# Patient Record
Sex: Male | Born: 1970
Health system: Southern US, Community
[De-identification: ages and names within clinical notes are randomized; demographics above are authoritative.]

## PROBLEM LIST (undated history)

## (undated) DIAGNOSIS — I319 Disease of pericardium, unspecified: Secondary | ICD-10-CM

## (undated) DIAGNOSIS — R011 Cardiac murmur, unspecified: Secondary | ICD-10-CM

## (undated) DIAGNOSIS — J45909 Unspecified asthma, uncomplicated: Secondary | ICD-10-CM

## (undated) DIAGNOSIS — E119 Type 2 diabetes mellitus without complications: Secondary | ICD-10-CM

## (undated) DIAGNOSIS — G459 Transient cerebral ischemic attack, unspecified: Secondary | ICD-10-CM

## (undated) DIAGNOSIS — R609 Edema, unspecified: Secondary | ICD-10-CM

## (undated) DIAGNOSIS — R569 Unspecified convulsions: Secondary | ICD-10-CM

## (undated) DIAGNOSIS — E785 Hyperlipidemia, unspecified: Secondary | ICD-10-CM

## (undated) DIAGNOSIS — G622 Polyneuropathy due to other toxic agents: Secondary | ICD-10-CM

## (undated) DIAGNOSIS — M1990 Unspecified osteoarthritis, unspecified site: Secondary | ICD-10-CM

## (undated) DIAGNOSIS — G619 Inflammatory polyneuropathy, unspecified: Secondary | ICD-10-CM

## (undated) DIAGNOSIS — I2510 Atherosclerotic heart disease of native coronary artery without angina pectoris: Secondary | ICD-10-CM

## (undated) DIAGNOSIS — I10 Essential (primary) hypertension: Secondary | ICD-10-CM

## (undated) DIAGNOSIS — K219 Gastro-esophageal reflux disease without esophagitis: Secondary | ICD-10-CM

## (undated) DIAGNOSIS — N529 Male erectile dysfunction, unspecified: Secondary | ICD-10-CM

## (undated) DIAGNOSIS — M109 Gout, unspecified: Secondary | ICD-10-CM

## (undated) DIAGNOSIS — E291 Testicular hypofunction: Secondary | ICD-10-CM

## (undated) DIAGNOSIS — G47419 Narcolepsy without cataplexy: Secondary | ICD-10-CM

## (undated) DIAGNOSIS — I209 Angina pectoris, unspecified: Secondary | ICD-10-CM

## (undated) DIAGNOSIS — D869 Sarcoidosis, unspecified: Secondary | ICD-10-CM

## (undated) HISTORY — DX: Type 2 diabetes mellitus without complications: E11.9

## (undated) HISTORY — DX: Male erectile dysfunction, unspecified: N52.9

## (undated) HISTORY — DX: Narcolepsy without cataplexy: G47.419

## (undated) HISTORY — DX: Inflammatory polyneuropathy, unspecified: G61.9

## (undated) HISTORY — DX: Atherosclerotic heart disease of native coronary artery without angina pectoris: I25.10

## (undated) HISTORY — DX: Transient cerebral ischemic attack, unspecified: G45.9

## (undated) HISTORY — DX: Gastro-esophageal reflux disease without esophagitis: K21.9

## (undated) HISTORY — DX: Essential (primary) hypertension: I10

## (undated) HISTORY — DX: Hyperlipidemia, unspecified: E78.5

## (undated) HISTORY — DX: Disease of pericardium, unspecified: I31.9

## (undated) HISTORY — DX: Testicular hypofunction: E29.1

## (undated) HISTORY — DX: Unspecified osteoarthritis, unspecified site: M19.90

## (undated) HISTORY — DX: Edema, unspecified: R60.9

## (undated) HISTORY — DX: Inflammatory polyneuropathy, unspecified: G62.2

## (undated) HISTORY — DX: Gout, unspecified: M10.9

## (undated) HISTORY — DX: Sarcoidosis, unspecified: D86.9

---

## 1997-10-02 ENCOUNTER — Inpatient Hospital Stay (HOSPITAL_COMMUNITY): Admission: EM | Admit: 1997-10-02 | Discharge: 1997-10-03 | Payer: Self-pay | Admitting: Emergency Medicine

## 1997-10-04 ENCOUNTER — Emergency Department (HOSPITAL_COMMUNITY): Admission: EM | Admit: 1997-10-04 | Discharge: 1997-10-04 | Payer: Self-pay | Admitting: Emergency Medicine

## 1997-10-26 ENCOUNTER — Encounter: Admission: RE | Admit: 1997-10-26 | Discharge: 1997-10-26 | Payer: Self-pay | Admitting: Family Medicine

## 2000-04-19 ENCOUNTER — Encounter: Payer: Self-pay | Admitting: Emergency Medicine

## 2000-04-19 ENCOUNTER — Emergency Department (HOSPITAL_COMMUNITY): Admission: EM | Admit: 2000-04-19 | Discharge: 2000-04-20 | Payer: Self-pay | Admitting: Emergency Medicine

## 2000-07-16 ENCOUNTER — Encounter: Payer: Self-pay | Admitting: Internal Medicine

## 2000-07-16 ENCOUNTER — Inpatient Hospital Stay (HOSPITAL_COMMUNITY): Admission: EM | Admit: 2000-07-16 | Discharge: 2000-07-17 | Payer: Self-pay | Admitting: Emergency Medicine

## 2001-05-16 ENCOUNTER — Encounter: Payer: Self-pay | Admitting: Internal Medicine

## 2001-05-16 ENCOUNTER — Ambulatory Visit (HOSPITAL_COMMUNITY): Admission: RE | Admit: 2001-05-16 | Discharge: 2001-05-16 | Payer: Self-pay | Admitting: Internal Medicine

## 2001-05-17 ENCOUNTER — Encounter: Payer: Self-pay | Admitting: Internal Medicine

## 2001-05-17 ENCOUNTER — Inpatient Hospital Stay (HOSPITAL_COMMUNITY): Admission: EM | Admit: 2001-05-17 | Discharge: 2001-05-20 | Payer: Self-pay | Admitting: *Deleted

## 2001-05-18 ENCOUNTER — Encounter: Payer: Self-pay | Admitting: Internal Medicine

## 2002-02-20 ENCOUNTER — Inpatient Hospital Stay (HOSPITAL_COMMUNITY): Admission: EM | Admit: 2002-02-20 | Discharge: 2002-02-22 | Payer: Self-pay | Admitting: Emergency Medicine

## 2002-02-20 ENCOUNTER — Encounter: Payer: Self-pay | Admitting: *Deleted

## 2002-02-21 ENCOUNTER — Encounter: Payer: Self-pay | Admitting: Cardiovascular Disease

## 2002-02-21 ENCOUNTER — Encounter (INDEPENDENT_AMBULATORY_CARE_PROVIDER_SITE_OTHER): Payer: Self-pay | Admitting: Cardiovascular Disease

## 2002-02-22 ENCOUNTER — Encounter: Payer: Self-pay | Admitting: Cardiology

## 2003-02-16 ENCOUNTER — Emergency Department (HOSPITAL_COMMUNITY): Admission: AD | Admit: 2003-02-16 | Discharge: 2003-02-16 | Payer: Self-pay | Admitting: Emergency Medicine

## 2003-11-22 ENCOUNTER — Ambulatory Visit (HOSPITAL_COMMUNITY): Admission: RE | Admit: 2003-11-22 | Discharge: 2003-11-22 | Payer: Self-pay | Admitting: Cardiovascular Disease

## 2003-11-22 ENCOUNTER — Inpatient Hospital Stay (HOSPITAL_COMMUNITY): Admission: EM | Admit: 2003-11-22 | Discharge: 2003-11-23 | Payer: Self-pay | Admitting: Emergency Medicine

## 2006-01-06 ENCOUNTER — Encounter: Admission: RE | Admit: 2006-01-06 | Discharge: 2006-01-06 | Payer: Self-pay | Admitting: Cardiovascular Disease

## 2007-08-03 ENCOUNTER — Ambulatory Visit (HOSPITAL_COMMUNITY): Admission: RE | Admit: 2007-08-03 | Discharge: 2007-08-03 | Payer: Self-pay | Admitting: Cardiovascular Disease

## 2007-12-07 ENCOUNTER — Emergency Department (HOSPITAL_COMMUNITY): Admission: EM | Admit: 2007-12-07 | Discharge: 2007-12-08 | Payer: Self-pay | Admitting: Emergency Medicine

## 2008-07-23 ENCOUNTER — Encounter: Admission: RE | Admit: 2008-07-23 | Discharge: 2008-07-23 | Payer: Self-pay | Admitting: Cardiovascular Disease

## 2008-07-30 ENCOUNTER — Emergency Department (HOSPITAL_COMMUNITY): Admission: EM | Admit: 2008-07-30 | Discharge: 2008-07-30 | Payer: Self-pay | Admitting: Internal Medicine

## 2008-08-10 ENCOUNTER — Ambulatory Visit: Payer: Self-pay | Admitting: Pulmonary Disease

## 2008-08-14 ENCOUNTER — Telehealth: Payer: Self-pay | Admitting: Pulmonary Disease

## 2008-08-14 LAB — CONVERTED CEMR LAB
ALT: 35 units/L (ref 0–53)
AST: 26 units/L (ref 0–37)
Albumin: 3.8 g/dL (ref 3.5–5.2)
Alkaline Phosphatase: 72 units/L (ref 39–117)
Angiotensin 1 Converting Enzyme: 38 units/L (ref 9–67)
Anti Nuclear Antibody(ANA): NEGATIVE
BUN: 11 mg/dL (ref 6–23)
Basophils Absolute: 0 10*3/uL (ref 0.0–0.1)
Basophils Relative: 0.8 % (ref 0.0–3.0)
Bilirubin, Direct: 0.1 mg/dL (ref 0.0–0.3)
CEA: 1.5 ng/mL (ref 0.0–5.0)
CO2: 30 meq/L (ref 19–32)
Calcium: 9.4 mg/dL (ref 8.4–10.5)
Chloride: 102 meq/L (ref 96–112)
Creatinine, Ser: 0.7 mg/dL (ref 0.4–1.5)
Eosinophils Absolute: 0.3 10*3/uL (ref 0.0–0.7)
Eosinophils Relative: 5 % (ref 0.0–5.0)
GFR calc Af Amer: 165 mL/min
GFR calc non Af Amer: 136 mL/min
Glucose, Bld: 109 mg/dL — ABNORMAL HIGH (ref 70–99)
HCT: 40.6 % (ref 39.0–52.0)
Hemoglobin: 13.8 g/dL (ref 13.0–17.0)
LDH: 241 units/L (ref 94–250)
Lymphocytes Relative: 40.5 % (ref 12.0–46.0)
MCHC: 34 g/dL (ref 30.0–36.0)
MCV: 78.7 fL (ref 78.0–100.0)
Monocytes Absolute: 0.7 10*3/uL (ref 0.1–1.0)
Monocytes Relative: 11.1 % (ref 3.0–12.0)
Neutro Abs: 2.7 10*3/uL (ref 1.4–7.7)
Neutrophils Relative %: 42.6 % — ABNORMAL LOW (ref 43.0–77.0)
Platelets: 354 10*3/uL (ref 150–400)
Potassium: 3.8 meq/L (ref 3.5–5.1)
RBC: 5.16 M/uL (ref 4.22–5.81)
RDW: 13.1 % (ref 11.5–14.6)
Rhuematoid fact SerPl-aCnc: <20 intl units/mL — ABNORMAL LOW (ref 0.0–20.0)
Sed Rate: 42 mm/hr — ABNORMAL HIGH (ref 0–16)
Sodium: 140 meq/L (ref 135–145)
Total Bilirubin: 0.7 mg/dL (ref 0.3–1.2)
Total Protein: 7.6 g/dL (ref 6.0–8.3)
WBC: 6.2 10*3/uL (ref 4.5–10.5)
hCG, Beta Chain, Quant, S: <0.5 milliintl units/mL

## 2008-08-16 ENCOUNTER — Ambulatory Visit: Payer: Self-pay | Admitting: Cardiovascular Disease

## 2008-08-17 ENCOUNTER — Ambulatory Visit: Payer: Self-pay | Admitting: Pulmonary Disease

## 2008-08-21 ENCOUNTER — Ambulatory Visit: Payer: Self-pay | Admitting: Emergency Medicine

## 2008-08-21 ENCOUNTER — Encounter: Payer: Self-pay | Admitting: Emergency Medicine

## 2008-08-21 ENCOUNTER — Ambulatory Visit (HOSPITAL_COMMUNITY): Admission: RE | Admit: 2008-08-21 | Discharge: 2008-08-21 | Payer: Self-pay | Admitting: Thoracic Surgery

## 2008-08-21 ENCOUNTER — Encounter: Payer: Self-pay | Admitting: Thoracic Surgery

## 2008-08-21 HISTORY — PX: BRONCHOSCOPY: SUR163

## 2008-08-27 ENCOUNTER — Telehealth: Payer: Self-pay | Admitting: Pulmonary Disease

## 2008-08-31 ENCOUNTER — Ambulatory Visit: Payer: Self-pay | Admitting: Pulmonary Disease

## 2008-09-04 ENCOUNTER — Telehealth (INDEPENDENT_AMBULATORY_CARE_PROVIDER_SITE_OTHER): Payer: Self-pay | Admitting: *Deleted

## 2008-10-02 ENCOUNTER — Ambulatory Visit: Payer: Self-pay | Admitting: Pulmonary Disease

## 2008-10-03 LAB — CONVERTED CEMR LAB
ALT: 92 units/L — ABNORMAL HIGH (ref 0–53)
AST: 49 units/L — ABNORMAL HIGH (ref 0–37)
Albumin: 3.8 g/dL (ref 3.5–5.2)
Alkaline Phosphatase: 73 units/L (ref 39–117)
BUN: 12 mg/dL (ref 6–23)
Basophils Absolute: 0.3 10*3/uL — ABNORMAL HIGH (ref 0.0–0.1)
Basophils Relative: 3 % (ref 0.0–3.0)
Bilirubin, Direct: 0.2 mg/dL (ref 0.0–0.3)
CO2: 28 meq/L (ref 19–32)
Calcium: 9.4 mg/dL (ref 8.4–10.5)
Chloride: 99 meq/L (ref 96–112)
Creatinine, Ser: 0.8 mg/dL (ref 0.4–1.5)
Eosinophils Absolute: 0 10*3/uL (ref 0.0–0.7)
Eosinophils Relative: 0.1 % (ref 0.0–5.0)
GFR calc non Af Amer: 140.82 mL/min (ref >60)
Glucose, Bld: 176 mg/dL — ABNORMAL HIGH (ref 70–99)
HCT: 43.8 % (ref 39.0–52.0)
Hemoglobin: 15.1 g/dL (ref 13.0–17.0)
Lymphocytes Relative: 8.2 % — ABNORMAL LOW (ref 12.0–46.0)
Lymphs Abs: 0.9 10*3/uL (ref 0.7–4.0)
MCHC: 34.5 g/dL (ref 30.0–36.0)
MCV: 78.9 fL (ref 78.0–100.0)
Monocytes Absolute: 0.1 10*3/uL (ref 0.1–1.0)
Monocytes Relative: 0.6 % — ABNORMAL LOW (ref 3.0–12.0)
Neutro Abs: 9.1 10*3/uL — ABNORMAL HIGH (ref 1.4–7.7)
Neutrophils Relative %: 88.1 % — ABNORMAL HIGH (ref 43.0–77.0)
Platelets: 295 10*3/uL (ref 150.0–400.0)
Potassium: 4.2 meq/L (ref 3.5–5.1)
RBC: 5.54 M/uL (ref 4.22–5.81)
RDW: 14.7 % — ABNORMAL HIGH (ref 11.5–14.6)
Sodium: 137 meq/L (ref 135–145)
Total Bilirubin: 0.7 mg/dL (ref 0.3–1.2)
Total Protein: 7.6 g/dL (ref 6.0–8.3)
WBC: 10.4 10*3/uL (ref 4.5–10.5)

## 2008-11-06 ENCOUNTER — Ambulatory Visit: Payer: Self-pay | Admitting: Pulmonary Disease

## 2008-11-07 ENCOUNTER — Ambulatory Visit: Payer: Self-pay | Admitting: Cardiology

## 2008-11-12 ENCOUNTER — Telehealth: Payer: Self-pay | Admitting: Pulmonary Disease

## 2008-11-12 LAB — CONVERTED CEMR LAB
ALT: 52 units/L (ref 0–53)
AST: 29 units/L (ref 0–37)
Albumin: 3.5 g/dL (ref 3.5–5.2)
Alkaline Phosphatase: 77 units/L (ref 39–117)
BUN: 14 mg/dL (ref 6–23)
Basophils Absolute: 0 10*3/uL (ref 0.0–0.1)
Basophils Relative: 0 % (ref 0.0–3.0)
Bilirubin, Direct: 0.1 mg/dL (ref 0.0–0.3)
CO2: 30 meq/L (ref 19–32)
Calcium: 8.9 mg/dL (ref 8.4–10.5)
Chloride: 102 meq/L (ref 96–112)
Creatinine, Ser: 0.8 mg/dL (ref 0.4–1.5)
Eosinophils Absolute: 0 10*3/uL (ref 0.0–0.7)
Eosinophils Relative: 0.2 % (ref 0.0–5.0)
GFR calc non Af Amer: 140.74 mL/min (ref >60)
Glucose, Bld: 213 mg/dL — ABNORMAL HIGH (ref 70–99)
HCT: 39.6 % (ref 39.0–52.0)
Hemoglobin: 13.8 g/dL (ref 13.0–17.0)
Lymphocytes Relative: 15.5 % (ref 12.0–46.0)
Lymphs Abs: 1.3 10*3/uL (ref 0.7–4.0)
MCHC: 34.9 g/dL (ref 30.0–36.0)
MCV: 78.9 fL (ref 78.0–100.0)
Monocytes Absolute: 0.2 10*3/uL (ref 0.1–1.0)
Monocytes Relative: 2.6 % — ABNORMAL LOW (ref 3.0–12.0)
Neutro Abs: 7.2 10*3/uL (ref 1.4–7.7)
Neutrophils Relative %: 81.7 % — ABNORMAL HIGH (ref 43.0–77.0)
Platelets: 302 10*3/uL (ref 150.0–400.0)
Potassium: 3.8 meq/L (ref 3.5–5.1)
RBC: 5.02 M/uL (ref 4.22–5.81)
RDW: 15.1 % — ABNORMAL HIGH (ref 11.5–14.6)
Sodium: 139 meq/L (ref 135–145)
TSH: 0.22 microintl units/mL — ABNORMAL LOW (ref 0.35–5.50)
Total Bilirubin: 0.7 mg/dL (ref 0.3–1.2)
Total Protein: 7.5 g/dL (ref 6.0–8.3)
WBC: 8.7 10*3/uL (ref 4.5–10.5)

## 2008-11-13 LAB — CONVERTED CEMR LAB
Free T4: 0.7 ng/dL (ref 0.6–1.6)
T3 Uptake Ratio: 45.2 % — ABNORMAL HIGH (ref 22.5–37.0)

## 2008-11-19 ENCOUNTER — Telehealth: Payer: Self-pay | Admitting: Pulmonary Disease

## 2008-11-20 ENCOUNTER — Ambulatory Visit: Payer: Self-pay | Admitting: Thoracic Surgery

## 2008-11-30 ENCOUNTER — Ambulatory Visit (HOSPITAL_COMMUNITY): Admission: RE | Admit: 2008-11-30 | Discharge: 2008-11-30 | Payer: Self-pay | Admitting: Thoracic Surgery

## 2008-11-30 ENCOUNTER — Ambulatory Visit: Payer: Self-pay | Admitting: Thoracic Surgery

## 2008-11-30 ENCOUNTER — Encounter: Payer: Self-pay | Admitting: Thoracic Surgery

## 2008-11-30 HISTORY — PX: MEDIASTINOSCOPY: SUR861

## 2008-12-04 ENCOUNTER — Ambulatory Visit: Payer: Self-pay | Admitting: Thoracic Surgery

## 2008-12-04 ENCOUNTER — Encounter: Payer: Self-pay | Admitting: Pulmonary Disease

## 2008-12-13 ENCOUNTER — Ambulatory Visit: Payer: Self-pay | Admitting: Pulmonary Disease

## 2008-12-13 DIAGNOSIS — D869 Sarcoidosis, unspecified: Secondary | ICD-10-CM | POA: Insufficient documentation

## 2008-12-20 ENCOUNTER — Ambulatory Visit (HOSPITAL_BASED_OUTPATIENT_CLINIC_OR_DEPARTMENT_OTHER): Admission: RE | Admit: 2008-12-20 | Discharge: 2008-12-20 | Payer: Self-pay | Admitting: Pulmonary Disease

## 2008-12-20 ENCOUNTER — Encounter: Payer: Self-pay | Admitting: Pulmonary Disease

## 2008-12-26 ENCOUNTER — Ambulatory Visit: Payer: Self-pay | Admitting: Pulmonary Disease

## 2009-01-01 ENCOUNTER — Ambulatory Visit: Payer: Self-pay | Admitting: Thoracic Surgery

## 2009-01-01 ENCOUNTER — Encounter: Admission: RE | Admit: 2009-01-01 | Discharge: 2009-01-01 | Payer: Self-pay | Admitting: Thoracic Surgery

## 2009-01-08 ENCOUNTER — Ambulatory Visit: Payer: Self-pay | Admitting: Pulmonary Disease

## 2009-01-09 ENCOUNTER — Encounter: Payer: Self-pay | Admitting: Pulmonary Disease

## 2009-01-09 ENCOUNTER — Ambulatory Visit (HOSPITAL_BASED_OUTPATIENT_CLINIC_OR_DEPARTMENT_OTHER): Admission: RE | Admit: 2009-01-09 | Discharge: 2009-01-09 | Payer: Self-pay | Admitting: Pulmonary Disease

## 2009-01-25 ENCOUNTER — Ambulatory Visit: Payer: Self-pay | Admitting: Pulmonary Disease

## 2009-01-29 ENCOUNTER — Ambulatory Visit (HOSPITAL_COMMUNITY): Admission: RE | Admit: 2009-01-29 | Discharge: 2009-01-29 | Payer: Self-pay | Admitting: Pulmonary Disease

## 2009-01-31 ENCOUNTER — Ambulatory Visit: Payer: Self-pay | Admitting: Pulmonary Disease

## 2009-02-01 ENCOUNTER — Telehealth: Payer: Self-pay | Admitting: Pulmonary Disease

## 2009-02-06 ENCOUNTER — Telehealth: Payer: Self-pay | Admitting: Pulmonary Disease

## 2009-03-01 ENCOUNTER — Ambulatory Visit: Payer: Self-pay | Admitting: Pulmonary Disease

## 2009-03-06 LAB — CONVERTED CEMR LAB
ALT: 38 units/L (ref 0–53)
AST: 23 units/L (ref 0–37)
Albumin: 3.8 g/dL (ref 3.5–5.2)
Alkaline Phosphatase: 70 units/L (ref 39–117)
BUN: 14 mg/dL (ref 6–23)
Basophils Absolute: 0.3 10*3/uL — ABNORMAL HIGH (ref 0.0–0.1)
Basophils Relative: 3.4 % — ABNORMAL HIGH (ref 0.0–3.0)
Bilirubin, Direct: 0 mg/dL (ref 0.0–0.3)
CO2: 26 meq/L (ref 19–32)
Calcium: 9.5 mg/dL (ref 8.4–10.5)
Chloride: 98 meq/L (ref 96–112)
Creatinine, Ser: 0.9 mg/dL (ref 0.4–1.5)
Eosinophils Absolute: 0 10*3/uL (ref 0.0–0.7)
Eosinophils Relative: 0 % (ref 0.0–5.0)
GFR calc non Af Amer: 122.64 mL/min (ref >60)
Glucose, Bld: 237 mg/dL — ABNORMAL HIGH (ref 70–99)
HCT: 41.2 % (ref 39.0–52.0)
Hemoglobin: 14.1 g/dL (ref 13.0–17.0)
Lymphocytes Relative: 11.8 % — ABNORMAL LOW (ref 12.0–46.0)
Lymphs Abs: 1.1 10*3/uL (ref 0.7–4.0)
MCHC: 34.3 g/dL (ref 30.0–36.0)
MCV: 79.9 fL (ref 78.0–100.0)
Monocytes Absolute: 1.4 10*3/uL — ABNORMAL HIGH (ref 0.1–1.0)
Monocytes Relative: 15.7 % — ABNORMAL HIGH (ref 3.0–12.0)
Neutro Abs: 6.4 10*3/uL (ref 1.4–7.7)
Neutrophils Relative %: 69.1 % (ref 43.0–77.0)
Platelets: 320 10*3/uL (ref 150.0–400.0)
Potassium: 3.9 meq/L (ref 3.5–5.1)
RBC: 5.16 M/uL (ref 4.22–5.81)
RDW: 13.6 % (ref 11.5–14.6)
Sodium: 134 meq/L — ABNORMAL LOW (ref 135–145)
Total Bilirubin: 0.7 mg/dL (ref 0.3–1.2)
Total Protein: 8 g/dL (ref 6.0–8.3)
WBC: 9.2 10*3/uL (ref 4.5–10.5)

## 2009-03-07 ENCOUNTER — Telehealth: Payer: Self-pay | Admitting: Pulmonary Disease

## 2009-03-27 ENCOUNTER — Ambulatory Visit: Payer: Self-pay | Admitting: Pulmonary Disease

## 2009-03-27 ENCOUNTER — Encounter: Payer: Self-pay | Admitting: Pulmonary Disease

## 2009-03-28 LAB — CONVERTED CEMR LAB
ALT: 31 units/L (ref 0–53)
AST: 26 units/L (ref 0–37)
Albumin: 3.7 g/dL (ref 3.5–5.2)
Alkaline Phosphatase: 85 units/L (ref 39–117)
BUN: 8 mg/dL (ref 6–23)
Basophils Absolute: 0.1 10*3/uL (ref 0.0–0.1)
Basophils Relative: 0.9 % (ref 0.0–3.0)
Bilirubin, Direct: 0 mg/dL (ref 0.0–0.3)
CO2: 28 meq/L (ref 19–32)
Calcium: 9.3 mg/dL (ref 8.4–10.5)
Chloride: 103 meq/L (ref 96–112)
Creatinine, Ser: 0.7 mg/dL (ref 0.4–1.5)
Eosinophils Absolute: 0.2 10*3/uL (ref 0.0–0.7)
Eosinophils Relative: 4 % (ref 0.0–5.0)
GFR calc non Af Amer: 163.83 mL/min (ref >60)
Glucose, Bld: 164 mg/dL — ABNORMAL HIGH (ref 70–99)
HCT: 38.8 % — ABNORMAL LOW (ref 39.0–52.0)
Hemoglobin: 13.7 g/dL (ref 13.0–17.0)
Lymphocytes Relative: 40.2 % (ref 12.0–46.0)
Lymphs Abs: 2.3 10*3/uL (ref 0.7–4.0)
MCHC: 35.4 g/dL (ref 30.0–36.0)
MCV: 80 fL (ref 78.0–100.0)
Monocytes Absolute: 0.7 10*3/uL (ref 0.1–1.0)
Monocytes Relative: 12 % (ref 3.0–12.0)
Neutro Abs: 2.4 10*3/uL (ref 1.4–7.7)
Neutrophils Relative %: 42.9 % — ABNORMAL LOW (ref 43.0–77.0)
Platelets: 353 10*3/uL (ref 150.0–400.0)
Potassium: 3.8 meq/L (ref 3.5–5.1)
RBC: 4.84 M/uL (ref 4.22–5.81)
RDW: 13.4 % (ref 11.5–14.6)
Sodium: 140 meq/L (ref 135–145)
Total Bilirubin: 0.6 mg/dL (ref 0.3–1.2)
Total Protein: 7.7 g/dL (ref 6.0–8.3)
WBC: 5.7 10*3/uL (ref 4.5–10.5)

## 2009-05-14 ENCOUNTER — Ambulatory Visit: Payer: Self-pay | Admitting: Pulmonary Disease

## 2009-05-15 ENCOUNTER — Telehealth: Payer: Self-pay | Admitting: Pulmonary Disease

## 2009-05-15 LAB — CONVERTED CEMR LAB
ALT: 38 units/L (ref 0–53)
AST: 26 units/L (ref 0–37)
Albumin: 3.5 g/dL (ref 3.5–5.2)
Alkaline Phosphatase: 75 units/L (ref 39–117)
BUN: 11 mg/dL (ref 6–23)
Basophils Absolute: 0.1 10*3/uL (ref 0.0–0.1)
Basophils Relative: 1.9 % (ref 0.0–3.0)
Bilirubin, Direct: 0.1 mg/dL (ref 0.0–0.3)
CO2: 28 meq/L (ref 19–32)
Calcium: 9.2 mg/dL (ref 8.4–10.5)
Chloride: 108 meq/L (ref 96–112)
Creatinine, Ser: 0.9 mg/dL (ref 0.4–1.5)
Eosinophils Absolute: 0.2 10*3/uL (ref 0.0–0.7)
Eosinophils Relative: 4.4 % (ref 0.0–5.0)
GFR calc non Af Amer: 122.5 mL/min (ref >60)
Glucose, Bld: 125 mg/dL — ABNORMAL HIGH (ref 70–99)
HCT: 38.3 % — ABNORMAL LOW (ref 39.0–52.0)
Hemoglobin: 12.8 g/dL — ABNORMAL LOW (ref 13.0–17.0)
Lymphocytes Relative: 35.2 % (ref 12.0–46.0)
Lymphs Abs: 1.8 10*3/uL (ref 0.7–4.0)
MCHC: 33.3 g/dL (ref 30.0–36.0)
MCV: 82 fL (ref 78.0–100.0)
Monocytes Absolute: 0.6 10*3/uL (ref 0.1–1.0)
Monocytes Relative: 12.2 % — ABNORMAL HIGH (ref 3.0–12.0)
Neutro Abs: 2.4 10*3/uL (ref 1.4–7.7)
Neutrophils Relative %: 46.3 % (ref 43.0–77.0)
Platelets: 306 10*3/uL (ref 150.0–400.0)
Potassium: 4.1 meq/L (ref 3.5–5.1)
Pro B Natriuretic peptide (BNP): 50 pg/mL (ref 0.0–100.0)
RBC: 4.68 M/uL (ref 4.22–5.81)
RDW: 13.4 % (ref 11.5–14.6)
Sodium: 143 meq/L (ref 135–145)
Total Bilirubin: 0.4 mg/dL (ref 0.3–1.2)
Total Protein: 7.5 g/dL (ref 6.0–8.3)
WBC: 5.1 10*3/uL (ref 4.5–10.5)

## 2009-06-17 ENCOUNTER — Ambulatory Visit: Payer: Self-pay | Admitting: Pulmonary Disease

## 2009-06-21 LAB — CONVERTED CEMR LAB
ALT: 29 units/L (ref 0–53)
AST: 21 units/L (ref 0–37)
Albumin: 3.6 g/dL (ref 3.5–5.2)
Alkaline Phosphatase: 73 units/L (ref 39–117)
BUN: 12 mg/dL (ref 6–23)
Basophils Absolute: 0.2 10*3/uL — ABNORMAL HIGH (ref 0.0–0.1)
Basophils Relative: 3.1 % — ABNORMAL HIGH (ref 0.0–3.0)
Bilirubin, Direct: 0.1 mg/dL (ref 0.0–0.3)
CO2: 31 meq/L (ref 19–32)
Calcium: 9.4 mg/dL (ref 8.4–10.5)
Chloride: 96 meq/L (ref 96–112)
Creatinine, Ser: 0.9 mg/dL (ref 0.4–1.5)
Eosinophils Absolute: 0.3 10*3/uL (ref 0.0–0.7)
Eosinophils Relative: 4.5 % (ref 0.0–5.0)
GFR calc non Af Amer: 122.43 mL/min (ref >60)
Glucose, Bld: 141 mg/dL — ABNORMAL HIGH (ref 70–99)
HCT: 41.1 % (ref 39.0–52.0)
Hemoglobin: 13.6 g/dL (ref 13.0–17.0)
Lymphocytes Relative: 40.2 % (ref 12.0–46.0)
Lymphs Abs: 2.8 10*3/uL (ref 0.7–4.0)
MCHC: 33.1 g/dL (ref 30.0–36.0)
MCV: 81 fL (ref 78.0–100.0)
Monocytes Absolute: 0.9 10*3/uL (ref 0.1–1.0)
Monocytes Relative: 13.8 % — ABNORMAL HIGH (ref 3.0–12.0)
Neutro Abs: 2.6 10*3/uL (ref 1.4–7.7)
Neutrophils Relative %: 38.4 % — ABNORMAL LOW (ref 43.0–77.0)
Platelets: 342 10*3/uL (ref 150.0–400.0)
Potassium: 3.3 meq/L — ABNORMAL LOW (ref 3.5–5.1)
RBC: 5.07 M/uL (ref 4.22–5.81)
RDW: 12.8 % (ref 11.5–14.6)
Sodium: 138 meq/L (ref 135–145)
Total Bilirubin: 0.7 mg/dL (ref 0.3–1.2)
Total Protein: 8.5 g/dL — ABNORMAL HIGH (ref 6.0–8.3)
WBC: 6.8 10*3/uL (ref 4.5–10.5)

## 2009-06-24 ENCOUNTER — Encounter: Payer: Self-pay | Admitting: Internal Medicine

## 2009-06-24 ENCOUNTER — Inpatient Hospital Stay (HOSPITAL_COMMUNITY): Admission: EM | Admit: 2009-06-24 | Discharge: 2009-06-26 | Payer: Self-pay | Admitting: Emergency Medicine

## 2009-06-24 LAB — CONVERTED CEMR LAB: Hgb A1c MFr Bld: 6.5 %

## 2009-06-25 ENCOUNTER — Encounter: Payer: Self-pay | Admitting: Internal Medicine

## 2009-06-25 ENCOUNTER — Encounter (INDEPENDENT_AMBULATORY_CARE_PROVIDER_SITE_OTHER): Payer: Self-pay | Admitting: Cardiovascular Disease

## 2009-06-25 HISTORY — PX: CARDIAC CATHETERIZATION: SHX172

## 2009-07-01 ENCOUNTER — Emergency Department (HOSPITAL_COMMUNITY): Admission: EM | Admit: 2009-07-01 | Discharge: 2009-07-02 | Payer: Self-pay | Admitting: Emergency Medicine

## 2009-07-11 ENCOUNTER — Ambulatory Visit: Payer: Self-pay | Admitting: Pulmonary Disease

## 2009-07-11 DIAGNOSIS — M129 Arthropathy, unspecified: Secondary | ICD-10-CM | POA: Insufficient documentation

## 2009-07-22 ENCOUNTER — Emergency Department (HOSPITAL_COMMUNITY): Admission: EM | Admit: 2009-07-22 | Discharge: 2009-07-23 | Payer: Self-pay | Admitting: Emergency Medicine

## 2009-07-23 ENCOUNTER — Encounter: Admission: RE | Admit: 2009-07-23 | Discharge: 2009-07-23 | Payer: Self-pay | Admitting: Cardiovascular Disease

## 2009-07-24 ENCOUNTER — Telehealth: Payer: Self-pay | Admitting: Pulmonary Disease

## 2009-07-25 ENCOUNTER — Telehealth: Payer: Self-pay | Admitting: Pulmonary Disease

## 2009-07-26 ENCOUNTER — Telehealth: Payer: Self-pay | Admitting: Pulmonary Disease

## 2009-07-30 ENCOUNTER — Encounter: Payer: Self-pay | Admitting: Internal Medicine

## 2009-07-30 ENCOUNTER — Telehealth: Payer: Self-pay | Admitting: Pulmonary Disease

## 2009-08-28 ENCOUNTER — Encounter: Payer: Self-pay | Admitting: Pulmonary Disease

## 2009-08-28 ENCOUNTER — Telehealth: Payer: Self-pay | Admitting: Pulmonary Disease

## 2009-09-16 ENCOUNTER — Ambulatory Visit: Payer: Self-pay | Admitting: Pulmonary Disease

## 2009-09-17 ENCOUNTER — Ambulatory Visit: Payer: Self-pay | Admitting: Pulmonary Disease

## 2009-09-18 LAB — CONVERTED CEMR LAB
BUN: 9 mg/dL (ref 6–23)
CO2: 31 meq/L (ref 19–32)
Calcium: 9.1 mg/dL (ref 8.4–10.5)
Chloride: 101 meq/L (ref 96–112)
Creatinine, Ser: 0.7 mg/dL (ref 0.4–1.5)
GFR calc non Af Amer: 163.4 mL/min (ref >60)
Glucose, Bld: 153 mg/dL — ABNORMAL HIGH (ref 70–99)
Potassium: 3.4 meq/L — ABNORMAL LOW (ref 3.5–5.1)
Sodium: 142 meq/L (ref 135–145)

## 2009-09-20 ENCOUNTER — Ambulatory Visit: Payer: Self-pay | Admitting: Internal Medicine

## 2009-09-26 ENCOUNTER — Encounter: Payer: Self-pay | Admitting: Internal Medicine

## 2009-10-03 ENCOUNTER — Ambulatory Visit: Payer: Self-pay | Admitting: Pulmonary Disease

## 2009-10-07 ENCOUNTER — Ambulatory Visit: Payer: Self-pay | Admitting: Internal Medicine

## 2009-10-07 DIAGNOSIS — R609 Edema, unspecified: Secondary | ICD-10-CM | POA: Insufficient documentation

## 2009-10-07 DIAGNOSIS — E1165 Type 2 diabetes mellitus with hyperglycemia: Secondary | ICD-10-CM | POA: Insufficient documentation

## 2009-10-07 DIAGNOSIS — E669 Obesity, unspecified: Secondary | ICD-10-CM | POA: Insufficient documentation

## 2009-10-07 DIAGNOSIS — E785 Hyperlipidemia, unspecified: Secondary | ICD-10-CM | POA: Insufficient documentation

## 2009-10-07 DIAGNOSIS — M109 Gout, unspecified: Secondary | ICD-10-CM | POA: Insufficient documentation

## 2009-10-07 DIAGNOSIS — N529 Male erectile dysfunction, unspecified: Secondary | ICD-10-CM | POA: Insufficient documentation

## 2009-10-07 DIAGNOSIS — E1169 Type 2 diabetes mellitus with other specified complication: Secondary | ICD-10-CM | POA: Insufficient documentation

## 2009-10-07 DIAGNOSIS — I2510 Atherosclerotic heart disease of native coronary artery without angina pectoris: Secondary | ICD-10-CM | POA: Insufficient documentation

## 2009-10-07 DIAGNOSIS — I10 Essential (primary) hypertension: Secondary | ICD-10-CM | POA: Insufficient documentation

## 2009-10-08 ENCOUNTER — Telehealth (INDEPENDENT_AMBULATORY_CARE_PROVIDER_SITE_OTHER): Payer: Self-pay | Admitting: *Deleted

## 2009-10-08 DIAGNOSIS — E291 Testicular hypofunction: Secondary | ICD-10-CM | POA: Insufficient documentation

## 2009-10-08 LAB — CONVERTED CEMR LAB
Creatinine,U: 65.4 mg/dL
Hgb A1c MFr Bld: 6.8 % — ABNORMAL HIGH (ref 4.6–6.5)
Microalb Creat Ratio: 24.5 mg/g (ref 0.0–30.0)
Microalb, Ur: 1.6 mg/dL (ref 0.0–1.9)
TSH: 0.62 microintl units/mL (ref 0.35–5.50)
Testosterone: 199.74 ng/dL — ABNORMAL LOW (ref 350.00–890.00)

## 2009-10-09 ENCOUNTER — Telehealth: Payer: Self-pay | Admitting: Internal Medicine

## 2009-10-09 ENCOUNTER — Ambulatory Visit: Payer: Self-pay | Admitting: Internal Medicine

## 2009-10-14 LAB — HM DIABETES EYE EXAM

## 2009-10-15 ENCOUNTER — Telehealth: Payer: Self-pay | Admitting: Internal Medicine

## 2009-10-17 ENCOUNTER — Ambulatory Visit: Payer: Self-pay | Admitting: Endocrinology

## 2009-10-17 DIAGNOSIS — G619 Inflammatory polyneuropathy, unspecified: Secondary | ICD-10-CM | POA: Insufficient documentation

## 2009-10-17 DIAGNOSIS — G622 Polyneuropathy due to other toxic agents: Secondary | ICD-10-CM | POA: Insufficient documentation

## 2009-10-18 ENCOUNTER — Ambulatory Visit: Payer: Self-pay | Admitting: Pulmonary Disease

## 2009-10-24 ENCOUNTER — Encounter: Payer: Self-pay | Admitting: Internal Medicine

## 2009-11-07 ENCOUNTER — Ambulatory Visit: Payer: Self-pay | Admitting: Internal Medicine

## 2009-11-14 ENCOUNTER — Telehealth: Payer: Self-pay | Admitting: Internal Medicine

## 2009-11-15 ENCOUNTER — Telehealth (INDEPENDENT_AMBULATORY_CARE_PROVIDER_SITE_OTHER): Payer: Self-pay | Admitting: *Deleted

## 2009-11-19 ENCOUNTER — Ambulatory Visit: Payer: Self-pay | Admitting: Pulmonary Disease

## 2009-11-19 ENCOUNTER — Encounter: Payer: Self-pay | Admitting: Pulmonary Disease

## 2009-12-03 ENCOUNTER — Ambulatory Visit: Payer: Self-pay | Admitting: Pulmonary Disease

## 2009-12-03 DIAGNOSIS — J45990 Exercise induced bronchospasm: Secondary | ICD-10-CM | POA: Insufficient documentation

## 2009-12-10 ENCOUNTER — Ambulatory Visit: Payer: Self-pay | Admitting: Internal Medicine

## 2009-12-18 ENCOUNTER — Telehealth: Payer: Self-pay | Admitting: Internal Medicine

## 2009-12-23 ENCOUNTER — Ambulatory Visit: Payer: Self-pay | Admitting: Internal Medicine

## 2009-12-31 ENCOUNTER — Ambulatory Visit: Payer: Self-pay | Admitting: Pulmonary Disease

## 2010-01-07 ENCOUNTER — Ambulatory Visit: Payer: Self-pay | Admitting: Cardiology

## 2010-01-07 ENCOUNTER — Ambulatory Visit: Payer: Self-pay | Admitting: Internal Medicine

## 2010-01-07 DIAGNOSIS — K219 Gastro-esophageal reflux disease without esophagitis: Secondary | ICD-10-CM | POA: Insufficient documentation

## 2010-01-07 LAB — CONVERTED CEMR LAB
BUN: 7 mg/dL (ref 6–23)
Basophils Absolute: 0 10*3/uL (ref 0.0–0.1)
Basophils Relative: 0.4 % (ref 0.0–3.0)
CO2: 27 meq/L (ref 19–32)
Calcium: 9.1 mg/dL (ref 8.4–10.5)
Chloride: 109 meq/L (ref 96–112)
Creatinine, Ser: 0.7 mg/dL (ref 0.4–1.5)
Eosinophils Absolute: 0.2 10*3/uL (ref 0.0–0.7)
Eosinophils Relative: 2.5 % (ref 0.0–5.0)
GFR calc non Af Amer: 168.67 mL/min (ref >60)
Glucose, Bld: 186 mg/dL — ABNORMAL HIGH (ref 70–99)
HCT: 38.7 % — ABNORMAL LOW (ref 39.0–52.0)
Hemoglobin: 13.1 g/dL (ref 13.0–17.0)
Hgb A1c MFr Bld: 7.1 % — ABNORMAL HIGH (ref 4.6–6.5)
Lymphocytes Relative: 36.9 % (ref 12.0–46.0)
Lymphs Abs: 2.3 10*3/uL (ref 0.7–4.0)
MCHC: 33.8 g/dL (ref 30.0–36.0)
MCV: 81.4 fL (ref 78.0–100.0)
Monocytes Absolute: 0.6 10*3/uL (ref 0.1–1.0)
Monocytes Relative: 9 % (ref 3.0–12.0)
Neutro Abs: 3.2 10*3/uL (ref 1.4–7.7)
Neutrophils Relative %: 51.2 % (ref 43.0–77.0)
Platelets: 291 10*3/uL (ref 150.0–400.0)
Potassium: 3.5 meq/L (ref 3.5–5.1)
Pro B Natriuretic peptide (BNP): 31.2 pg/mL (ref 0.0–100.0)
RBC: 4.76 M/uL (ref 4.22–5.81)
RDW: 16.3 % — ABNORMAL HIGH (ref 11.5–14.6)
Sodium: 144 meq/L (ref 135–145)
TSH: 0.58 microintl units/mL (ref 0.35–5.50)
WBC: 6.2 10*3/uL (ref 4.5–10.5)

## 2010-01-12 ENCOUNTER — Emergency Department (HOSPITAL_COMMUNITY): Admission: EM | Admit: 2010-01-12 | Discharge: 2010-01-12 | Payer: Self-pay | Admitting: Emergency Medicine

## 2010-01-13 ENCOUNTER — Telehealth: Payer: Self-pay | Admitting: Internal Medicine

## 2010-01-20 ENCOUNTER — Telehealth: Payer: Self-pay | Admitting: Internal Medicine

## 2010-01-24 ENCOUNTER — Encounter: Payer: Self-pay | Admitting: Pulmonary Disease

## 2010-01-24 ENCOUNTER — Encounter: Payer: Self-pay | Admitting: Internal Medicine

## 2010-01-28 ENCOUNTER — Ambulatory Visit: Payer: Self-pay | Admitting: Pulmonary Disease

## 2010-01-30 ENCOUNTER — Telehealth (INDEPENDENT_AMBULATORY_CARE_PROVIDER_SITE_OTHER): Payer: Self-pay | Admitting: *Deleted

## 2010-01-31 ENCOUNTER — Ambulatory Visit: Payer: Self-pay | Admitting: Internal Medicine

## 2010-01-31 LAB — CONVERTED CEMR LAB
ALT: 33 units/L (ref 0–53)
AST: 27 units/L (ref 0–37)
Albumin: 4 g/dL (ref 3.5–5.2)
Alkaline Phosphatase: 82 units/L (ref 39–117)
BUN: 11 mg/dL (ref 6–23)
Basophils Absolute: 0.1 10*3/uL (ref 0.0–0.1)
Basophils Relative: 0.7 % (ref 0.0–3.0)
Bilirubin Urine: NEGATIVE
Bilirubin, Direct: 0.2 mg/dL (ref 0.0–0.3)
CO2: 30 meq/L (ref 19–32)
Calcium: 9.5 mg/dL (ref 8.4–10.5)
Chloride: 101 meq/L (ref 96–112)
Cortisol, Plasma: 8.4 ug/dL
Creatinine, Ser: 0.8 mg/dL (ref 0.4–1.5)
Eosinophils Absolute: 0.2 10*3/uL (ref 0.0–0.7)
Eosinophils Relative: 2.8 % (ref 0.0–5.0)
GFR calc non Af Amer: 146.08 mL/min (ref >60)
Glucose, Bld: 131 mg/dL — ABNORMAL HIGH (ref 70–99)
HCT: 42 % (ref 39.0–52.0)
Hemoglobin, Urine: NEGATIVE
Hemoglobin: 14.4 g/dL (ref 13.0–17.0)
Ketones, ur: NEGATIVE mg/dL
Leukocytes, UA: NEGATIVE
Lymphocytes Relative: 35 % (ref 12.0–46.0)
Lymphs Abs: 2.8 10*3/uL (ref 0.7–4.0)
MCHC: 34.3 g/dL (ref 30.0–36.0)
MCV: 81.1 fL (ref 78.0–100.0)
Monocytes Absolute: 0.3 10*3/uL (ref 0.1–1.0)
Monocytes Relative: 3.7 % (ref 3.0–12.0)
Neutro Abs: 4.6 10*3/uL (ref 1.4–7.7)
Neutrophils Relative %: 57.8 % (ref 43.0–77.0)
Nitrite: NEGATIVE
Platelets: 332 10*3/uL (ref 150.0–400.0)
Potassium: 3.6 meq/L (ref 3.5–5.1)
Pro B Natriuretic peptide (BNP): 26.8 pg/mL (ref 0.0–100.0)
RBC: 5.19 M/uL (ref 4.22–5.81)
RDW: 15.7 % — ABNORMAL HIGH (ref 11.5–14.6)
Sed Rate: 35 mm/hr — ABNORMAL HIGH (ref 0–22)
Sodium: 142 meq/L (ref 135–145)
Specific Gravity, Urine: >=1.03 (ref 1.000–1.030)
TSH: 0.44 microintl units/mL (ref 0.35–5.50)
Total Bilirubin: 0.8 mg/dL (ref 0.3–1.2)
Total Protein, Urine: NEGATIVE mg/dL
Total Protein: 7.5 g/dL (ref 6.0–8.3)
Urine Glucose: NEGATIVE mg/dL
Urobilinogen, UA: 0.2 (ref 0.0–1.0)
WBC: 8 10*3/uL (ref 4.5–10.5)
pH: 5.5 (ref 5.0–8.0)

## 2010-02-07 ENCOUNTER — Encounter (HOSPITAL_COMMUNITY): Admission: RE | Admit: 2010-02-07 | Discharge: 2010-02-27 | Payer: Self-pay | Admitting: Pulmonary Disease

## 2010-02-11 ENCOUNTER — Ambulatory Visit: Payer: Self-pay | Admitting: Internal Medicine

## 2010-02-17 ENCOUNTER — Ambulatory Visit: Payer: Self-pay | Admitting: Internal Medicine

## 2010-02-17 DIAGNOSIS — R300 Dysuria: Secondary | ICD-10-CM | POA: Insufficient documentation

## 2010-02-17 LAB — CONVERTED CEMR LAB
Bilirubin Urine: NEGATIVE
Hemoglobin, Urine: NEGATIVE
Leukocytes, UA: NEGATIVE
Nitrite: NEGATIVE
Specific Gravity, Urine: >=1.03 (ref 1.000–1.030)
Total Protein, Urine: 30 mg/dL
Urine Glucose: 100 mg/dL
Urobilinogen, UA: 1 (ref 0.0–1.0)
pH: 5.5 (ref 5.0–8.0)

## 2010-02-27 ENCOUNTER — Ambulatory Visit: Payer: Self-pay | Admitting: Internal Medicine

## 2010-03-13 ENCOUNTER — Ambulatory Visit: Payer: Self-pay | Admitting: Endocrinology

## 2010-03-13 ENCOUNTER — Ambulatory Visit: Payer: Self-pay | Admitting: Internal Medicine

## 2010-04-02 ENCOUNTER — Ambulatory Visit: Payer: Self-pay | Admitting: Pulmonary Disease

## 2010-04-02 ENCOUNTER — Telehealth: Payer: Self-pay | Admitting: Internal Medicine

## 2010-04-10 ENCOUNTER — Ambulatory Visit: Payer: Self-pay | Admitting: Internal Medicine

## 2010-04-10 DIAGNOSIS — M722 Plantar fascial fibromatosis: Secondary | ICD-10-CM | POA: Insufficient documentation

## 2010-04-10 LAB — HM DIABETES FOOT EXAM

## 2010-04-10 LAB — CONVERTED CEMR LAB: Hgb A1c MFr Bld: 7.1 % — ABNORMAL HIGH (ref 4.6–6.5)

## 2010-04-11 ENCOUNTER — Telehealth: Payer: Self-pay | Admitting: Internal Medicine

## 2010-04-11 ENCOUNTER — Emergency Department (HOSPITAL_COMMUNITY): Admission: EM | Admit: 2010-04-11 | Discharge: 2010-04-11 | Payer: Self-pay | Admitting: Emergency Medicine

## 2010-05-12 ENCOUNTER — Ambulatory Visit: Payer: Self-pay | Admitting: Internal Medicine

## 2010-06-03 ENCOUNTER — Telehealth: Payer: Self-pay | Admitting: Internal Medicine

## 2010-06-24 ENCOUNTER — Other Ambulatory Visit: Payer: Self-pay | Admitting: Pulmonary Disease

## 2010-06-24 ENCOUNTER — Ambulatory Visit
Admission: RE | Admit: 2010-06-24 | Discharge: 2010-06-24 | Payer: Self-pay | Source: Home / Self Care | Attending: Pulmonary Disease | Admitting: Pulmonary Disease

## 2010-06-24 ENCOUNTER — Ambulatory Visit
Admission: RE | Admit: 2010-06-24 | Discharge: 2010-06-24 | Payer: Self-pay | Source: Home / Self Care | Attending: Internal Medicine | Admitting: Internal Medicine

## 2010-06-24 LAB — BASIC METABOLIC PANEL
BUN: 11 mg/dL (ref 6–23)
CO2: 31 mEq/L (ref 19–32)
Calcium: 9.2 mg/dL (ref 8.4–10.5)
Chloride: 103 mEq/L (ref 96–112)
Creatinine, Ser: 0.7 mg/dL (ref 0.4–1.5)
GFR: 168.25 mL/min (ref >60.00)
Glucose, Bld: 128 mg/dL — ABNORMAL HIGH (ref 70–99)
Potassium: 3.4 mEq/L — ABNORMAL LOW (ref 3.5–5.1)
Sodium: 143 mEq/L (ref 135–145)

## 2010-06-24 LAB — CBC WITH DIFFERENTIAL/PLATELET
Basophils Absolute: 0 10*3/uL (ref 0.0–0.1)
Basophils Relative: 0.7 % (ref 0.0–3.0)
Eosinophils Absolute: 0.3 10*3/uL (ref 0.0–0.7)
Eosinophils Relative: 4.2 % (ref 0.0–5.0)
HCT: 39.3 % (ref 39.0–52.0)
Hemoglobin: 13.5 g/dL (ref 13.0–17.0)
Lymphocytes Relative: 44.5 % (ref 12.0–46.0)
Lymphs Abs: 3 10*3/uL (ref 0.7–4.0)
MCHC: 34.3 g/dL (ref 30.0–36.0)
MCV: 78.5 fl (ref 78.0–100.0)
Monocytes Absolute: 0.6 10*3/uL (ref 0.1–1.0)
Monocytes Relative: 8.3 % (ref 3.0–12.0)
Neutro Abs: 2.8 10*3/uL (ref 1.4–7.7)
Neutrophils Relative %: 42.3 % — ABNORMAL LOW (ref 43.0–77.0)
Platelets: 308 10*3/uL (ref 150.0–400.0)
RBC: 5 Mil/uL (ref 4.22–5.81)
RDW: 14.4 % (ref 11.5–14.6)
WBC: 6.6 10*3/uL (ref 4.5–10.5)

## 2010-06-24 LAB — HEPATIC FUNCTION PANEL
ALT: 43 U/L (ref 0–53)
AST: 31 U/L (ref 0–37)
Albumin: 3.7 g/dL (ref 3.5–5.2)
Alkaline Phosphatase: 93 U/L (ref 39–117)
Bilirubin, Direct: 0.1 mg/dL (ref 0.0–0.3)
Total Bilirubin: 0.4 mg/dL (ref 0.3–1.2)
Total Protein: 7.3 g/dL (ref 6.0–8.3)

## 2010-06-24 LAB — BRAIN NATRIURETIC PEPTIDE: Pro B Natriuretic peptide (BNP): 42.1 pg/mL (ref 0.0–100.0)

## 2010-06-24 LAB — SEDIMENTATION RATE: Sed Rate: 36 mm/hr — ABNORMAL HIGH (ref 0–22)

## 2010-06-30 ENCOUNTER — Telehealth: Payer: Self-pay | Admitting: Internal Medicine

## 2010-06-30 ENCOUNTER — Encounter: Payer: Self-pay | Admitting: Thoracic Surgery

## 2010-07-06 LAB — CONVERTED CEMR LAB
Cholesterol: 173 mg/dL
HDL: 31 mg/dL
LDL Cholesterol: 124 mg/dL
Triglyceride fasting, serum: 90 mg/dL

## 2010-07-08 NOTE — Progress Notes (Signed)
  Phone Note Call from Patient Call back at [facility] Phone [phone]   Caller: Patient Summary of Call: Pt called stating that after OV yesterday he had to go to [facility] ER for HA, severe neck and diarrhea. Pt was advised that he probably had a viral infection. Pt is now requesting Rx for HA and neck pain. Pt says diarrhea is helped with Immodium. Initial call taken by: Margaret Pyle, CMA,  April 11, 2010 2:49 PM  Follow-up for Phone Call        can phone in vicodin 5/500 1 every 4 hours as needed for severe pain symptoms #20, no refills- thanks Follow-up by: Newt Lukes MD,  April 11, 2010 3:05 PM  Additional Follow-up for Phone Call Additional follow up Details #1::        Rx called into pharmacy, pt informed Additional Follow-up by: Margaret Pyle, CMA,  April 11, 2010 3:15 PM    New/Updated Medications: VICODIN 5-500 MG TABS (HYDROCODONE-ACETAMINOPHEN) 1 tab every 4 hours as needed for severe pain Prescriptions: VICODIN 5-500 MG TABS (HYDROCODONE-ACETAMINOPHEN) 1 tab every 4 hours as needed for severe pain  #20 x 0   Entered and Authorized by:   Margaret Pyle, CMA   Signed by:   Margaret Pyle, CMA on 04/11/2010   Method used:   Telephoned to ...       [facility] (retail)       [address]       [place], [place]  [address]       Ph: [phone]       Fax: [phone]   RxID:   [id number]

## 2010-07-08 NOTE — Letter (Signed)
Summary: Medical Clearance/Scott Thad Ranger DMD  Medical Clearance/Scott Thad Ranger DMD   Imported By: Lester [place] 12/25/2009 09:04:32  _____________________________________________________________________  External Attachment:    Type:   Image     Comment:   External Document

## 2010-07-08 NOTE — Progress Notes (Signed)
Summary: Metformin  Phone Note Call from Patient Call back at Home Phone [phone]   Caller: Patient Summary of Call: Pt called stating that he has CT with contrast in ER last night and was told to hold Metformin this am due to contrast dye. Pt says he forgot and still took Metformin this am and has not been able to urinate all day. Pt is concerned, please advise. Initial call taken by: Margaret Pyle, CMA,  January 13, 2010 2:24 PM  Follow-up for Phone Call        there should not be a problem with taking one dose of metformin this AM - pt should hydrate - drink lots of water - and do not take any more metformin until Wed AM (or when 48h is up) - if still unable to urinate after drinking adequate water, or if vomitting or abd pain develops, can go to ER for eval -- but if able to drink water, i feel there is low risk of problems related to the single metformin pill after contrast - thanks Follow-up by: Newt Lukes MD,  January 13, 2010 4:01 PM  Additional Follow-up for Phone Call Additional follow up Details #1::        Pt informed and will call back with any further questions. Additional Follow-up by: Margaret Pyle, CMA,  January 13, 2010 4:13 PM

## 2010-07-08 NOTE — Letter (Signed)
Summary: [facility] [place]   Imported By: Lester [place] 01/31/2010 08:22:26  _____________________________________________________________________  External Attachment:    Type:   Image     Comment:   External Document

## 2010-07-08 NOTE — Progress Notes (Signed)
Summary: Rx request  Phone Note Call from Patient   Caller: Patient Summary of Call: pt requests Rx for One Touch Ultra 2 Test Strips to [facility] [address] Initial call taken by: Margaret Pyle, CMA,  Oct 09, 2009 3:25 PM    New/Updated Medications: ONETOUCH ULTRA TEST  STRP (GLUCOSE BLOOD) use as directed once daily Prescriptions: ONETOUCH ULTRA TEST  STRP (GLUCOSE BLOOD) use as directed once daily  #30 x 11   Entered by:   Margaret Pyle, CMA   Authorized by:   Newt Lukes MD   Signed by:   Margaret Pyle, CMA on 10/09/2009   Method used:   Electronically to        [facility] [address] [address] (retail)       [address]       [place], [place]  [address]       Ph: [phone]       Fax: [phone]   RxID:   [id number]

## 2010-07-08 NOTE — Assessment & Plan Note (Signed)
Summary: painful urination/cd   Vital Signs:  Patient profile:   40 year old male Height:      75 inches (190.50 cm) Weight:      292 pounds (132.73 kg) O2 Sat:      97 % on Room air Temp:     98.0 degrees F (36.67 degrees C) oral Pulse rate:   73 / minute BP sitting:   128 / 82  (left arm) Cuff size:   large  Vitals Entered By: Orlan Leavens RMA (February 17, 2010 10:01 AM)  O2 Flow:  Room air CC: Painful urination/ frequency but no little output Is Patient Diabetic? Yes Did you bring your meter with you today? No Pain Assessment Patient in pain? yes     Location: when he urinate Type: stinging   Primary Care Provider:  Newt Lukes MD  CC:  Painful urination/ frequency but no little output.  History of Present Illness: c/o dysuria - onset 1 week ago -  small vol voids with inc freq - +hx same - UTI in 2003 - symptoms resolved with 1 week abx no suprapubic or flank pain, no fever or hematuria - no hx kidney stones, no cathertization or procedures no penile discharge  also reviewed prior OV:  1) DM2 - prednisone induced - on metformin -check cbgs at home 2 x/day - 120-170s - no longer feels thirsty - no n/v or abd pain - seeing endo for same ([place])  2) sarcoidosis - involves lungs, joints and ?brain/liver - maintained on MTX for same, off pred sonce 12/2009- followed by pulm and rheum ([facility]), also neuro - inc pred dose related to inc symptoms of SOB - ?inc MTX planned if liver and [place] levels normalize per pt -   3) CAD - follows with [facility] cards fro same - hosp and cath 06/2009: nonobst dx, med mgmt - no CP since that time - c/o occ end of the day ankle edema - no change in symptoms in recent months, resolves with elevation and furosemide use  4) HTN - reports compliance with ongoing medical treatment. changes in medication reviewed (prev on atenolol, changed to metoprolol by pulm 12/03/09 for pulm issues). denies adverse side effects related to current therapy.    5) dyslipidemia - reports compliance with ongoing medical treatment and no changes in medication dose or frequency. denies adverse side effects related to current therapy. no GI or muscle c/o  6) ED - tried viagra and cialis in past - reports no interest as primary cause of problems but denies depression -  also low test level - no on gel replacement   Current Medications (verified): 1)  Xopenex Hfa 45 Mcg/act Aero (Levalbuterol Tartrate) .... Two Puffs Up To Four Times Per Day As Needed 2)  Methotrexate Sol .Marland Kitchen.. 8cc Injection Weekly Not Yet Started 3)  Folic Acid 1 Mg Tabs (Folic Acid) .... One By Mouth Once Daily 4)  Aspirin 325 Mg Tabs (Aspirin) .Marland Kitchen.. 1 By Mouth Once Daily 5)  Metoprolol Tartrate 50 Mg Tabs (Metoprolol Tartrate) .... Substitute Bystolic 10mg  One Daily 6)  Hydrochlorothiazide 12.5 Mg Tabs (Hydrochlorothiazide) .Marland Kitchen.. 1 By Mouth Daily 7)  Furosemide 40 Mg Tabs (Furosemide) .Marland Kitchen.. 1 By Mouth Two Times A Day As Needed 8)  Klor-Con M20 20 Meq Cr-Tabs (Potassium Chloride Crys Cr) .Marland Kitchen.. 1 By Mouth Two Times A Day (Or As Directed) 9)  Pravastatin Sodium 40 Mg Tabs (Pravastatin Sodium) .Marland Kitchen.. 1 By Mouth Daily 10)  Colcrys 0.6 Mg Tabs (  Colchicine) .Marland Kitchen.. 1 By Mouth Two Times A Day As Needed 11)  Allopurinol 300 Mg Tabs (Allopurinol) .... Take 1 Tablet By Mouth Once A Day 12)  Ambien 5 Mg Tabs (Zolpidem Tartrate) .Marland Kitchen.. 1 By Mouth At Bedtime As Needed 13)  Saw Palmetto Plus  Caps (Misc Natural Products) .... Take 3 Tablet By Mouth Once A Day 14)  Calcium D-Glucarate 500 Mg Caps (Nutritional Supplements) .Marland Kitchen.. 1 Per Day 15)  Depo-Testosterone 200 Mg/ml Oil (Testosterone Cypionate) .... 200mg  Im Every 3-4 Weeks 16)  Metformin Hcl 500 Mg Xr24h-Tab (Metformin Hcl) .... 4 Tabs Each Am 17)  Prodigy Blood Glucose Test  Strp (Glucose Blood) .... Check Blood Sugars Two Times A Day  Dx: 250.00 18)  Prodigy Lancets 26g  Misc (Lancets) .... Use Two Times A Day 19)  Levitra 10 Mg Tabs (Vardenafil Hcl)  .... Use As Needed 1 Hour Before Intercourse 20)  Pepcid 40 Mg Tabs (Famotidine) .... One At Bedtime 21)  Diltiazem Hcl 60 Mg Tabs (Diltiazem Hcl) .... Take 1 Four Times A Day 22)  Prednisone 10 Mg  Tabs (Prednisone) .... 2 Each Am With Breakfast Until Better Then One Daily Until Seen 23)  Aciphex 20 Mg  Tbec (Rabeprazole Sodium) .... Take  One 30-60 Min Before First Meal of The Day  Allergies (verified): 1)  ! Sulfa  Past History:  Past Medical History: Gout Hypertension Diabetes mellitus 2 Asthma  Rheumatic fever age 50 Arthritis      - ?juvenile rheumatoid arthritis vs sarcoidosis      - followed by Dr. Nickola Major with Rheumatology TIA   Peripheral neuropathy Mediastinal lymphadenopathy with biopsy proven sarcoidosis...............Marland KitchenDr. Craige Cotta       - CT chest 07/30/08       -CT chest 09/20/09 Interval development of numerous 2- 3 mm pulmonary nodules typical for Sarcoid,  Slight         interval decrease in the size of mildly enlarged mediastinal lymph nodes.       - MTX started 03/01/09 Possible Neurosarcoidis      - Followed by Dr. Craige Cotta with [facility] neurology Narcolepsy without Cataplexy      - MSLT 01/09/09       - MRI brain 01/29/09 ?neuro-sarcoidosis CAD - cath 06/2009 -nonobst dz     -follows with Dr. Francina Ames at [facility] hyperlipidemia   Review of Systems  The patient denies fever, chest pain, and abdominal pain.    Physical Exam  General:  overweight-appearing.  alert, well-developed, well-nourished, and cooperative to examination.    Lungs:  normal respiratory effort, no intercostal retractions or use of accessory muscles; normal breath sounds bilaterally - no crackles and no wheezes.    Heart:  normal rate, regular rhythm, no murmur, and no rub. BLE with trace edema.  Abdomen:  soft, non-tender, normal bowel sounds, no distention; no masses and no appreciable hepatomegaly or splenomegaly.     Impression & Recommendations:  Problem # 1:  DYSURIA  (ICD-788.1)  symptoms c/w UTI, hx same collect sample, tx emperically - cipro His updated medication list for this problem includes:    Ciprofloxacin Hcl 500 Mg Tabs (Ciprofloxacin hcl) .Marland Kitchen... 1 by mouth two times a day x 7 days  Orders: TLB-Udip w/ Micro ([id number]) T-Culture, Urine ([id number]) Prescription Created Electronically ([phone])  Encouraged to push clear liquids, get enough rest, and take acetaminophen as needed. To be seen in 10 days if no improvement, sooner if worse.  Complete Medication List: 1)  Xopenex Hfa 45  Mcg/act Aero (Levalbuterol tartrate) .... Two puffs up to four times per day as needed 2)  Methotrexate Sol  .Marland Kitchen.. 8cc injection weekly not yet started 3)  Folic Acid 1 Mg Tabs (Folic acid) .... One by mouth once daily 4)  Aspirin 325 Mg Tabs (Aspirin) .Marland Kitchen.. 1 by mouth once daily 5)  Metoprolol Tartrate 50 Mg Tabs (Metoprolol tartrate) .... Substitute bystolic 10mg  one daily 6)  Hydrochlorothiazide 12.5 Mg Tabs (Hydrochlorothiazide) .Marland Kitchen.. 1 by mouth daily 7)  Furosemide 40 Mg Tabs (Furosemide) .Marland Kitchen.. 1 by mouth two times a day as needed 8)  Klor-con M20 20 Meq Cr-tabs (Potassium chloride crys cr) .Marland Kitchen.. 1 by mouth two times a day (or as directed) 9)  Pravastatin Sodium 40 Mg Tabs (Pravastatin sodium) .Marland Kitchen.. 1 by mouth daily 10)  Colcrys 0.6 Mg Tabs (Colchicine) .Marland Kitchen.. 1 by mouth two times a day as needed 11)  Allopurinol 300 Mg Tabs (Allopurinol) .... Take 1 tablet by mouth once a day 12)  Ambien 5 Mg Tabs (Zolpidem tartrate) .Marland Kitchen.. 1 by mouth at bedtime as needed 13)  Saw Palmetto Plus Caps (Misc natural products) .... Take 3 tablet by mouth once a day 14)  Calcium D-glucarate 500 Mg Caps (Nutritional supplements) .Marland Kitchen.. 1 per day 15)  Depo-testosterone 200 Mg/ml Oil (Testosterone cypionate) .... 200mg  im every 3-4 weeks 16)  Metformin Hcl 500 Mg Xr24h-tab (Metformin hcl) .... 4 tabs each am 17)  Prodigy Blood Glucose Test Strp (Glucose blood) .... Check blood sugars two  times a day  dx: 250.00 18)  Prodigy Lancets 26g Misc (Lancets) .... Use two times a day 19)  Levitra 10 Mg Tabs (Vardenafil hcl) .... Use as needed 1 hour before intercourse 20)  Pepcid 40 Mg Tabs (Famotidine) .... One at bedtime 21)  Diltiazem Hcl 60 Mg Tabs (Diltiazem hcl) .... Take 1 four times a day 22)  Prednisone 10 Mg Tabs (Prednisone) .... 2 each am with breakfast until better then one daily until seen 23)  Aciphex 20 Mg Tbec (Rabeprazole sodium) .... Take  one 30-60 min before first meal of the day 24)  Ciprofloxacin Hcl 500 Mg Tabs (Ciprofloxacin hcl) .Marland Kitchen.. 1 by mouth two times a day x 7 days 25)  Tramadol Hcl 50 Mg Tabs (Tramadol hcl) .... As needed for pain (from dr. Nickola Major)  Patient Instructions: 1)  it was good to see you today. 2)  test(s) ordered today - your results will be posted on the phone tree for review in 48-72 hours from the time of test completion; call [phone] and enter your 9 digit MRN (listed above on this page, just below your name); if any changes need to be made or there are abnormal results, you will be contacted directly.  3)  cipro for your bladder symptoms - your prescriptions have been electronically submitted to your pharmacy. Please take as directed. Contact our office if you believe you're having problems with the medication(s).  4)  Please keep follow-up appointment as previously scheduled, sooner if problems.  5)  also prior instruct reviewed: 6)  start Pepcid for the sour taste and probable reflux symptoms  7)  increase Lasix to two times a day x 5 days and then resume once daily - also increase [potassium x 5 days as discussed - Prescriptions: CIPROFLOXACIN HCL 500 MG TABS (CIPROFLOXACIN HCL) 1 by mouth two times a day x 7 days  #14 x 0   Entered and Authorized by:   Newt Lukes MD   Signed  by:   Newt Lukes MD on 02/17/2010   Method used:   Electronically to        [facility] [place] [address] (retail)       [address]        [place], [place]  [address]       Ph: [phone]       Fax: [phone]   RxID:   [id number]

## 2010-07-08 NOTE — Assessment & Plan Note (Signed)
Summary: NEW / MEDICAID / #/ CD   Vital Signs:  Patient profile:   40 year old male Height:      75 inches Weight:      293.50 pounds BMI:     36.82 O2 Sat:      97 % on Room air Temp:     98.1 degrees F oral Pulse rate:   70 / minute BP sitting:   120 / 86  (left arm) Cuff size:   regular  Vitals Entered ByZella Ball Ewing (Oct 07, 2009 9:20 AM)  O2 Flow:  Room air CC: New Patient, New Medicare/RE   Primary Care Provider:  Newt Lukes MD  CC:  New Patient and New Medicare/RE.  History of Present Illness: new pt to me and our division - here to est care with PCP  1) DM2 - prednisone induced - on metformin w/o change x 2 yr - does not check cbgs at home d/t no meter - when [facility] checked last week, cbg 178 - feels thirsty since increase in pred dose 2 weeks ago - no n/v or abd pain  2) sarcoidosis - involves lungs, joints and ?brain/liver - maintained on pred and MTX for same- followed by pulm and rheum, also neuor - inc pred dose related to inc symptoms of SOB - ?inc MTX planned if liver and amonia levels normalize per pt -   3) CAD - follows with [facility] cards fro same - hosp and cath 06/2009: nonobst dx, med mgmt - no CP since that time - c/o occ end of the day ankle edema - no chnage in symptoms in recent months, resolves with elevation and furosemide use  4) HTN - reports compliance with ongoing medical treatment and no changes in medication dose or frequency. denies adverse side effects related to current therapy.   5) dyslipidemia - reports compliance with ongoing medical treatment and no changes in medication dose or frequency. denies adverse side effects related to current therapy. no GI or muscle c/o  6) ED - tried viagra in past - reports no interest as primary cause of problems but denies depression -   Preventive Screening-Counseling & Management  Alcohol-Tobacco     Alcohol drinks/day: 0     Smoking Status: current     Pack years: 22yrs x1 pack per  week  Caffeine-Diet-Exercise     Nutrition Referrals: no     Exercise Counseling: to improve exercise regimen     Depression Counseling: not indicated; screening negative for depression  Safety-Violence-Falls     Seat Belt Counseling: not indicated; patient wears seat belts     Helmet Counseling: not applicable     Firearm Counseling: not applicable     Smoke Detector Counseling: no     Violence Counseling: not indicated; no violence risk noted     Fall Risk Counseling: not indicated; no significant falls noted  Clinical Review Panels:  Lipid Management   Cholesterol:  173 (06/25/2009)   LDL (bad choesterol):  124 (06/25/2009)   HDL (good cholesterol):  31 (06/25/2009)   Triglycerides:  90 (06/25/2009)  Diabetes Management   HgBA1C:  6.5 (06/24/2009)   Creatinine:  0.7 (09/17/2009)  CBC   WBC:  6.8 (06/17/2009)   RBC:  5.07 (06/17/2009)   Hgb:  13.6 (06/17/2009)   Hct:  41.1 (06/17/2009)   Platelets:  342.0 (06/17/2009)   MCV  81.0 (06/17/2009)   MCHC  33.1 (06/17/2009)   RDW  12.8 (06/17/2009)  PMN:  38.4 (06/17/2009)   Lymphs:  40.2 (06/17/2009)   Monos:  13.8 (06/17/2009)   Eosinophils:  4.5 (06/17/2009)   Basophil:  3.1 (06/17/2009)  Complete Metabolic Panel   Glucose:  153 (09/17/2009)   Sodium:  142 (09/17/2009)   Potassium:  3.4 (09/17/2009)   Chloride:  101 (09/17/2009)   CO2:  31 (09/17/2009)   BUN:  9 (09/17/2009)   Creatinine:  0.7 (09/17/2009)   Albumin:  3.6 (06/17/2009)   Total Protein:  8.5 (06/17/2009)   Calcium:  9.1 (09/17/2009)   Total Bili:  0.7 (06/17/2009)   Alk Phos:  73 (06/17/2009)   SGPT (ALT):  29 (06/17/2009)   SGOT (AST):  21 (06/17/2009)   Current Medications (verified): 1)  Methotrexate 2.5 Mg Tabs (Methotrexate Sodium) .... Three By Mouth Once Per Week 2)  Folic Acid 1 Mg Tabs (Folic Acid) .... One By Mouth Once Daily 3)  Atenolol 100 Mg Tabs (Atenolol) .Marland Kitchen.. 1 By Mouth Daily 4)  Diltiazem Hcl 120 Mg Tabs (Diltiazem Hcl)  .Marland Kitchen.. 1 Two Times A Day 5)  Hydrochlorothiazide 12.5 Mg Tabs (Hydrochlorothiazide) .Marland Kitchen.. 1 By Mouth Daily 6)  Furosemide 40 Mg Tabs (Furosemide) .Marland Kitchen.. 1 By Mouth Daily 7)  Kaon-Cl-10 10 Meq Cr-Tabs (Potassium Chloride) .... 2 By Mouth Daily 8)  Pravastatin Sodium 40 Mg Tabs (Pravastatin Sodium) .Marland Kitchen.. 1 By Mouth Daily 9)  Glucophage 500 Mg Tabs (Metformin Hcl) .Marland Kitchen.. 1 By Mouth Two Times A Day 10)  Colcrys 0.6 Mg Tabs (Colchicine) .Marland Kitchen.. 1 By Mouth Two Times A Day As Needed 11)  Allopurinol 300 Mg Tabs (Allopurinol) .... Take 1 Tablet By Mouth Once A Day 12)  Lactulose 10 Gm/36ml Soln (Lactulose) .... 2 Teaspoons 3 Times A Day 13)  Ambien 5 Mg Tabs (Zolpidem Tartrate) .Marland Kitchen.. 1 By Mouth At Bedtime As Needed 14)  Saw Palmetto Plus  Caps (Misc Natural Products) .... Take 3 Tablet By Mouth Once A Day 15)  Xopenex Hfa 45 Mcg/act Aero (Levalbuterol Tartrate) .... Two Puffs Up To Four Times Per Day As Needed 16)  Calcium D-Glucarate 500 Mg Caps (Nutritional Supplements) .Marland Kitchen.. 1 Per Day 17)  Prednisone 10 Mg Tabs (Prednisone) .... 2 Tabs By Mouth Once Daily As Directed By Dr .Lendon Colonel 18)  Qvar 80 Mcg/act Aers (Beclomethasone Dipropionate) .... 2 Puffs Two Times A Day  Allergies (verified): 1)  ! Sulfa  Past History:  Past medical, surgical, family and social histories (including risk factors) reviewed, and no changes noted (except as noted below).  Past Medical History: Gout Hypertension Diabetes mellitus 2 Asthma Rheumatic fever age 15 Arthritis      - ?juvenile rheumatoid arthritis vs sarcoidosis      - followed by Dr. Nickola Major with Rheumatology TIA Peripheral neuropathy Mediastinal lymphadenopathy with biopsy proven sarcoidosis - Dr. Craige Cotta       - CT chest 07/30/08       -CT chest 09/20/09 Interval development of numerous 2- 3 mm pulmonary nodules typical for Sarcoid,  Slight interval decrease in the size of mildly enlarged mediastinal lymph nodes.       - MTX started 03/01/09 Possible  Neurosarcoidis      - Followed by Dr. Craige Cotta with [facility] Narcolepsy without Cataplexy      - MSLT 01/09/09       - MRI brain 01/29/09 ?neuro-sarcoidosis CAD - cath 06/2009 -nonobst dz     -follows with Dr. Sandria Manly at [facility] hyperlipidemia  Past Surgical History: Reviewed history from 12/13/2008 and no  changes required. Bronchoscopy 08/21/08 Mediastinoscopy 11/30/08  Family History: Reviewed history from 08/10/2008 and no changes required. Heart disease--father--deceased at age 93  Social History: Reviewed history from 08/10/2008 and no changes required. Married., lives with wife and 3 kids currently unemployed - prev worked as a Research officer, trade union, the security at [employer]. smoker - quit briefly 07/2008 no alcohol use Smoking Status:  current  Review of Systems       see HPI above. I have reviewed all other systems and they were negative.   Physical Exam  General:  overweight-appearing.  alert, well-developed, well-nourished, and cooperative to examination.    Eyes:  vision grossly intact; pupils equal, round and reactive to light.  conjunctiva and lids normal.    Ears:  normal pinnae bilaterally, without erythema, swelling, or tenderness to palpation. TMs clear, without effusion, or cerumen impaction. Hearing grossly normal bilaterally  Mouth:  teeth and gums in good repair; mucous membranes moist, without lesions or ulcers. oropharynx clear without exudate, no erythema.  Neck:  supple, full ROM, no masses, no thyromegaly; no thyroid nodules or tenderness. no JVD or carotid bruits.   Lungs:  normal respiratory effort, no intercostal retractions or use of accessory muscles; normal breath sounds bilaterally - no crackles and no wheezes.    Heart:  normal rate, regular rhythm, no murmur, and no rub. BLE without edema. normal DP pulses and normal cap refill in all 4 extremities    Abdomen:  soft, non-tender, normal bowel sounds, no distention; no masses and no appreciable  hepatomegaly or splenomegaly.   Msk:  No deformity or scoliosis noted of thoracic or lumbar spine.   Neurologic:  alert & oriented X3 and cranial nerves II-XII symetrically intact.  strength normal in all extremities, sensation intact to light touch, and gait normal. speech fluent without dysarthria or aphasia; follows commands with good comprehension.  Skin:  no rashes, vesicles, ulcers, or erythema. No nodules or irregularity to palpation.  Psych:  Oriented X3, memory intact for recent and remote, normally interactive, good eye contact, not anxious appearing, not depressed appearing, and not agitated.      Impression & Recommendations:  Problem # 1:  DIABETES MELLITUS, TYPE II (ICD-250.00)  check labs now before intiating change in meds- exac likely related to steroid inc in recent weeks provide glucometer for pt to monitor at home- refer to endo His updated medication list for this problem includes:    Glucophage 500 Mg Tabs (Metformin hcl) .Marland Kitchen... 1 by mouth two times a day    Aspirin 325 Mg Tabs (Aspirin) .Marland Kitchen... 1 by mouth once daily  Orders: TLB-A1C / Hgb A1C (Glycohemoglobin) (83036-A1C) TLB-Microalbumin/Creat Ratio, Urine (82043-MALB) Endocrinology Referral (Endocrine)  Labs Reviewed: Creat: 0.7 (09/17/2009)    Reviewed HgBA1c results: 6.5 (06/24/2009)  Problem # 2:  ERECTILE DYSFUNCTION, ORGANIC (ICD-607.84)  His updated medication list for this problem includes:    Cialis 5 Mg Tabs (Tadalafil) .Marland Kitchen... 1 by mouth prior to intercourse as needed  Orders: TLB-TSH (Thyroid Stimulating Hormone) (84443-TSH) TLB-Testosterone, Total (84403-TESTO) Prescription Created Electronically [phone])  Problem # 3:  HYPERTENSION (ICD-401.9)  His updated medication list for this problem includes:    Atenolol 100 Mg Tabs (Atenolol) .Marland Kitchen... 1 by mouth daily    Diltiazem Hcl Er Beads 240 Mg Xr24h-cap (Diltiazem hcl er beads) .Marland Kitchen... 1 by mouth once daily    Hydrochlorothiazide 12.5 Mg Tabs  (Hydrochlorothiazide) .Marland Kitchen... 1 by mouth daily    Furosemide 40 Mg Tabs (Furosemide) .Marland Kitchen... 1 by mouth daily  Orders:  Prescription Created Electronically [phone])  BP today: 120/86 Prior BP: 128/80 (10/03/2009)  Labs Reviewed: K+: 3.4 (09/17/2009) Creat: : 0.7 (09/17/2009)   Chol: 173 (06/25/2009)   HDL: 31 (06/25/2009)   LDL: 124 (06/25/2009)   TG: 90 (06/25/2009)  Problem # 4:  HYPERLIPIDEMIA (ICD-272.4)  His updated medication list for this problem includes:    Pravastatin Sodium 40 Mg Tabs (Pravastatin sodium) .Marland Kitchen... 1 by mouth daily  Labs Reviewed: SGOT: 21 (06/17/2009)   SGPT: 29 (06/17/2009)   HDL:31 (06/25/2009)  LDL:124 (06/25/2009)  Chol:173 (06/25/2009)  Trig:90 (06/25/2009)  Problem # 5:  PULMONARY SARCOIDOSIS (ICD-135) recent 10/03/09 pulm note reviewed: Flare w/ assoicated rhinitis, may have components of reactive airways-? ICS may help His main complaint is dyspnea/DOE w/ intermittent wheezing and rhinitis symptoms  Prednisone 20mg  should be adequate to control sarcoid flare.  REC: Trial of ICS to see if this helps w/ symptoms control  Begin QVAR 2 puffs two times a day , brush , rinse , and gargle after use.  Begin Nasonex 2 puffs two times a day  Continue on Zyrtec 10mg  at bedtime  Saline nasal rinses two times a day as needed  follow up 2 weeks Dr. Craige Cotta.  Stay on Prednisone at 20mg  once daily  Please contact office for sooner follow up if symptoms do not improve or worsen   Problem # 6:  EDEMA (ICD-782.3)  His updated medication list for this problem includes:    Hydrochlorothiazide 12.5 Mg Tabs (Hydrochlorothiazide) .Marland Kitchen... 1 by mouth daily    Furosemide 40 Mg Tabs (Furosemide) .Marland Kitchen... 1 by mouth daily  Discussed elevation of the legs, use of compression stockings (new written rx provided), sodium restiction, and medication use.   Problem # 7:  CORONARY ARTERY DISEASE (ICD-414.00) cath 06/2009 reviewed - cont mgmt by [facility] cards as ongoing - no active  symptoms  His updated medication list for this problem includes:    Atenolol 100 Mg Tabs (Atenolol) .Marland Kitchen... 1 by mouth daily    Diltiazem Hcl Er Beads 240 Mg Xr24h-cap (Diltiazem hcl er beads) .Marland Kitchen... 1 by mouth once daily    Hydrochlorothiazide 12.5 Mg Tabs (Hydrochlorothiazide) .Marland Kitchen... 1 by mouth daily    Furosemide 40 Mg Tabs (Furosemide) .Marland Kitchen... 1 by mouth daily    Aspirin 325 Mg Tabs (Aspirin) .Marland Kitchen... 1 by mouth once daily  Labs Reviewed: Chol: 173 (06/25/2009)   HDL: 31 (06/25/2009)   LDL: 124 (06/25/2009)   TG: 90 (06/25/2009)  Complete Medication List: 1)  Methotrexate 2.5 Mg Tabs (Methotrexate sodium) .... Three by mouth once per week 2)  Folic Acid 1 Mg Tabs (Folic acid) .... One by mouth once daily 3)  Atenolol 100 Mg Tabs (Atenolol) .Marland Kitchen.. 1 by mouth daily 4)  Diltiazem Hcl Er Beads 240 Mg Xr24h-cap (Diltiazem hcl er beads) .Marland Kitchen.. 1 by mouth once daily 5)  Hydrochlorothiazide 12.5 Mg Tabs (Hydrochlorothiazide) .Marland Kitchen.. 1 by mouth daily 6)  Furosemide 40 Mg Tabs (Furosemide) .Marland Kitchen.. 1 by mouth daily 7)  Kaon-cl-10 10 Meq Cr-tabs (Potassium chloride) .... 2 by mouth daily 8)  Pravastatin Sodium 40 Mg Tabs (Pravastatin sodium) .Marland Kitchen.. 1 by mouth daily 9)  Glucophage 500 Mg Tabs (Metformin hcl) .Marland Kitchen.. 1 by mouth two times a day 10)  Colcrys 0.6 Mg Tabs (Colchicine) .Marland Kitchen.. 1 by mouth two times a day as needed 11)  Allopurinol 300 Mg Tabs (Allopurinol) .... Take 1 tablet by mouth once a day 12)  Lactulose 10 Gm/58ml Soln (Lactulose) .... 2 teaspoons 3 times  a day 13)  Ambien 5 Mg Tabs (Zolpidem tartrate) .Marland Kitchen.. 1 by mouth at bedtime as needed 14)  Saw Palmetto Plus Caps (Misc natural products) .... Take 3 tablet by mouth once a day 15)  Xopenex Hfa 45 Mcg/act Aero (Levalbuterol tartrate) .... Two puffs up to four times per day as needed 16)  Calcium D-glucarate 500 Mg Caps (Nutritional supplements) .Marland Kitchen.. 1 per day 17)  Prednisone 10 Mg Tabs (Prednisone) .... 2 tabs by mouth once daily as directed by dr  .Lendon Colonel 18)  Qvar 80 Mcg/act Aers (Beclomethasone dipropionate) .... 2 puffs two times a day 19)  Cialis 5 Mg Tabs (Tadalafil) .Marland Kitchen.. 1 by mouth prior to intercourse as needed 20)  Aspirin 325 Mg Tabs (Aspirin) .Marland Kitchen.. 1 by mouth once daily  Patient Instructions: 1)  it was good to see you today. 2)  test(s) ordered today - your results will be posted on the phone tree for review in 48-72 hours from the time of test completion; call [phone] and enter your 9 digit MRN (listed above on this page, just below your name); if any changes need to be made or there are abnormal results, you will be contacted directly.  3)  refills on medications as requested - also sample and new prescription for Cialis as discussed - 4)  change Diltiazem to 240mg  exteneded release 5)  we'll make referral to endocrinology for rview of your diabetes managment. Our office will contact you regarding this appointment once made.  6)  new glucometer today - check sugars once daily at various times of day AM, before lunch and before bed - keep record of these sugars to bring to your endocrine appointment 7)  written prescription for compression hose to help with edema 8)  Please schedule a follow-up appointment in 3 months, sooner if problems.  Prescriptions: GLUCOPHAGE 500 MG TABS (METFORMIN HCL) 1 by mouth two times a day  #60 x 5   Entered and Authorized by:   Newt Lukes MD   Signed by:   Newt Lukes MD on 10/07/2009   Method used:   Electronically to        [facility] (retail)       [address]       [place], [place]  [address]       Ph: [phone]       Fax: [phone]   RxID:   [id number] PRAVASTATIN SODIUM 40 MG TABS (PRAVASTATIN SODIUM) 1 by mouth daily  #30 x 5   Entered and Authorized by:   Newt Lukes MD   Signed by:   Newt Lukes MD on 10/07/2009   Method used:   Electronically to        [facility] (retail)        [address]       [place], [place]  [address]       Ph: [phone]       Fax: [phone]   RxID:   [id number] KAON-CL-10 10 MEQ CR-TABS (POTASSIUM CHLORIDE) 2 by mouth daily  #60 x 5   Entered and Authorized by:   Newt Lukes MD   Signed by:   Newt Lukes MD on 10/07/2009   Method used:   Electronically to        [facility] (retail)       [address]       [place], [place]  [address]       Ph: [phone]       Fax: [phone]   RxID:   [id number] FUROSEMIDE 40 MG TABS (FUROSEMIDE) 1 by mouth daily  #30 x 5   Entered and Authorized by:   Newt Lukes MD   Signed by:   Newt Lukes MD on 10/07/2009   Method used:   Electronically to        [facility] (retail)       [address]       [place], [place]  [address]       Ph: [phone]       Fax: [phone]   RxID:   [id number] HYDROCHLOROTHIAZIDE 12.5 MG TABS (HYDROCHLOROTHIAZIDE) 1 by mouth daily  #30 x 5   Entered and Authorized by:   Newt Lukes MD   Signed by:   Newt Lukes MD on 10/07/2009   Method used:   Electronically to        [facility] ([facility])       [address]       [place], [place]  [address]       Ph: [phone]       Fax: [phone]   RxID:   [id number] ATENOLOL 100 MG TABS (ATENOLOL) 1 by mouth daily  #30 x 5   Entered and Authorized by:   Newt Lukes MD   Signed by:   Newt Lukes MD on 10/07/2009   Method used:   Electronically to        [facility] ([facility])       [address]       [place], [place]  [address]       Ph: [phone]       Fax: [phone]   RxID:   [id number] CIALIS 5 MG TABS (TADALAFIL) 1 by mouth prior to intercourse as needed  #6 x 1   Entered and Authorized by:   Newt Lukes MD   Signed by:   Newt Lukes MD  on 10/07/2009   Method used:   Electronically to        [facility] ([facility])       [address]       [place], [place]  [address]       Ph: [phone]       Fax: [phone]   RxID:   [id number] DILTIAZEM HCL ER BEADS 240 MG XR24H-CAP (DILTIAZEM HCL ER BEADS) 1 by mouth once daily  #30 x 5   Entered and Authorized by:   Newt Lukes MD   Signed by:   Newt Lukes MD on 10/07/2009   Method used:   Electronically to        [facility] ([facility])       [address]       [place], [place]  [address]       Ph: [phone]       Fax: [phone]   RxID:   [id number]

## 2010-07-08 NOTE — Assessment & Plan Note (Signed)
Summary: 2 weeks/ mbw   Copy to:  Dr. Nickola Major, Dr. Craige Cotta, Dr. Royann Shivers Primary Provider/Referring Provider:  Newt Lukes MD  CC:  Sarcoid follow-up.  The patient says he still has sob and chest tightness. The wheezing is better since starting Qvar.Marland Kitchen  History of Present Illness: 40 yo male with known hx of  pulmonary sarcoid with possible neurosarcoid, narcolepsy w/o cataplexy Also hx of TIA f/by  Dr. Craige Cotta with [facility], He is f/by  Dr. Nickola Major with rheumatology.  He has been getting chest discomfort and still feels short of breath.  He feels that Qvar has helped some with his wheezing.  He is on the same methotrexate dose, and is due to see Dr. Nickola Major in June.  He has noticed getting mouth sores when he takes methotrexate.  He has been started on medicine to decrease his ammonia level, and this has helped his mental status.  He was also told he may have had aseptic menigitis.    told menigits and had high ammonia level  He is not coughing much, and does not bring up much sputum.  He continues to get sweats.  He was seen by Cardiology at [facility] heart and vascular, and was told that his heart function is okay.   Current Medications (verified): 1)  Methotrexate 2.5 Mg Tabs (Methotrexate Sodium) .... Three By Mouth Once Per Week 2)  Folic Acid 1 Mg Tabs (Folic Acid) .... One By Mouth Once Daily 3)  Atenolol 100 Mg Tabs (Atenolol) .Marland Kitchen.. 1 By Mouth Daily 4)  Diltiazem Hcl Er Beads 240 Mg Xr24h-Cap (Diltiazem Hcl Er Beads) .Marland Kitchen.. 1 By Mouth Once Daily 5)  Hydrochlorothiazide 12.5 Mg Tabs (Hydrochlorothiazide) .Marland Kitchen.. 1 By Mouth Daily 6)  Furosemide 40 Mg Tabs (Furosemide) .Marland Kitchen.. 1 By Mouth Daily 7)  Kaon-Cl-10 10 Meq Cr-Tabs (Potassium Chloride) .... 2 By Mouth Daily 8)  Pravastatin Sodium 40 Mg Tabs (Pravastatin Sodium) .Marland Kitchen.. 1 By Mouth Daily 9)  Colcrys 0.6 Mg Tabs (Colchicine) .Marland Kitchen.. 1 By Mouth Two Times A Day As Needed 10)  Allopurinol 300 Mg Tabs (Allopurinol) .... Take 1  Tablet By Mouth Once A Day 11)  Lactulose 10 Gm/49ml Soln (Lactulose) .... 2 Teaspoons 3 Times A Day 12)  Ambien 5 Mg Tabs (Zolpidem Tartrate) .Marland Kitchen.. 1 By Mouth At Bedtime As Needed 13)  Saw Palmetto Plus  Caps (Misc Natural Products) .... Take 3 Tablet By Mouth Once A Day 14)  Xopenex Hfa 45 Mcg/act Aero (Levalbuterol Tartrate) .... Two Puffs Up To Four Times Per Day As Needed 15)  Calcium D-Glucarate 500 Mg Caps (Nutritional Supplements) .Marland Kitchen.. 1 Per Day 16)  Prednisone 10 Mg Tabs (Prednisone) .... 2 Tabs By Mouth Once Daily As Directed By Dr .Lendon Colonel 17)  Qvar 80 Mcg/act Aers (Beclomethasone Dipropionate) .... 2 Puffs Two Times A Day 18)  Cialis 5 Mg Tabs (Tadalafil) .Marland Kitchen.. 1 By Mouth Prior To Intercourse As Needed 19)  Aspirin 325 Mg Tabs (Aspirin) .Marland Kitchen.. 1 By Mouth Once Daily 20)  Depo-Testosterone 200 Mg/ml Oil (Testosterone Cypionate) .... 200mg  Im Every 3-4 Weeks 21)  Prodigy Blood Glucose Monitor  Devi (Blood Glucose Monitoring Suppl) .... Use As Directed 22)  Prodigy Blood Glucose Test  Strp (Glucose Blood) .... Check Blood Sugars Two Times A Day  Dx: 250.00 23)  Prodigy Lancets 26g  Misc (Lancets) .... Use Two Times A Day 24)  Metformin Hcl 500 Mg Xr24h-Tab (Metformin Hcl) .... 4 Tabs Each Am 25)  Glucophage 500 Mg Tabs (Metformin Hcl) .Marland KitchenMarland KitchenMarland Kitchen  4 By Mouth Daily  Allergies (verified): 1)  ! Sulfa  Past History:  Past Medical History: Gout Hypertension Diabetes mellitus 2 Asthma Rheumatic fever age 45 Arthritis      - ?juvenile rheumatoid arthritis vs sarcoidosis      - followed by Dr. Nickola Major with Rheumatology TIA Peripheral neuropathy Mediastinal lymphadenopathy with biopsy proven sarcoidosis - Dr. Craige Cotta       - CT chest 07/30/08       -CT chest 09/20/09 Interval development of numerous 2- 3 mm pulmonary nodules typical for Sarcoid,  Slight interval decrease in the size of mildly enlarged mediastinal lymph nodes.       - MTX started 03/01/09 Possible Neurosarcoidis      - Followed by  Dr. Craige Cotta with [facility] Narcolepsy without Cataplexy      - MSLT 01/09/09       - MRI brain 01/29/09 ?neuro-sarcoidosis CAD - cath 06/2009 -nonobst dz     -follows with Dr. Royann Shivers at [facility] hyperlipidemia  Past Surgical History: Reviewed history from 12/13/2008 and no changes required. Bronchoscopy 08/21/08 Mediastinoscopy 11/30/08  Vital Signs:  Patient profile:   40 year old male Height:      75 inches (190.50 cm) Weight:      290 pounds (131.82 kg) BMI:     36.38 O2 Sat:      99 % on Room air Temp:     98.0 degrees F (36.67 degrees C) oral Pulse rate:   78 / minute BP sitting:   132 / 80  (left arm) Cuff size:   large  Vitals Entered By: Michel Bickers CMA (Oct 18, 2009 11:04 AM)  O2 Sat at Rest %:  99 O2 Flow:  Room air CC: Sarcoid follow-up.  The patient says he still has sob and chest tightness. The wheezing is better since starting Qvar. Comments Medications reviewed. Michel Bickers CMA  Oct 18, 2009 11:05 AM   Physical Exam  General:  obese.   Nose:  no deformity, discharge, inflammation, or lesions Mouth:  no exudate Neck:  no JVD.   Lungs:  clear bilaterally to auscultation and percussion Heart:  regular rate and rhythm, S1, S2 without murmurs, rubs, gallops, or clicks Extremities:  no clubbing, cyanosis, edema, or deformity noted Cervical Nodes:  no significant adenopathy   Pulmonary Function Test Date: 10/18/2009 11:36 AM Gender: Male  Pre-Spirometry FVC    Value: 2.98 L/min   % Pred: 57.30 % FEV1    Value: 2.77 L     Pred: 4.27 L     % Pred: 64.90 % FEV1/FVC  Value: 92.98 %     % Pred: 112.50 %  Comments: Moderate to severe restriction  Impression & Recommendations:  Problem # 1:  PULMONARY SARCOIDOSIS (ICD-135) His chest xray was unremarkable, and spirometry has been stable.  I am not sure his current symptoms are related to his pulmonary involvement from sarcoidosis.  He has noticed symptomatic improvement with Qvar, and  will continue this.  I will defer manipulation of his prednisone and methotrexate to Dr. Nickola Major.  Of note is that Mr. Willems has developed more sores associated with when he takes his methotrexate dose.  Medications Added to Medication List This Visit: 1)  Glucophage 500 Mg Tabs (Metformin hcl) .... 4 by mouth daily  Complete Medication List: 1)  Qvar 80 Mcg/act Aers (Beclomethasone dipropionate) .... 2 puffs two times a day 2)  Xopenex Hfa 45 Mcg/act Aero (Levalbuterol tartrate) .... Two puffs up  to four times per day as needed 3)  Prednisone 10 Mg Tabs (Prednisone) .... 2 tabs by mouth once daily as directed by dr .Lendon Colonel 4)  Methotrexate 2.5 Mg Tabs (Methotrexate sodium) .... Three by mouth once per week 5)  Folic Acid 1 Mg Tabs (Folic acid) .... One by mouth once daily 6)  Aspirin 325 Mg Tabs (Aspirin) .Marland Kitchen.. 1 by mouth once daily 7)  Atenolol 100 Mg Tabs (Atenolol) .Marland Kitchen.. 1 by mouth daily 8)  Diltiazem Hcl Er Beads 240 Mg Xr24h-cap (Diltiazem hcl er beads) .Marland Kitchen.. 1 by mouth once daily 9)  Hydrochlorothiazide 12.5 Mg Tabs (Hydrochlorothiazide) .Marland Kitchen.. 1 by mouth daily 10)  Furosemide 40 Mg Tabs (Furosemide) .Marland Kitchen.. 1 by mouth daily 11)  Kaon-cl-10 10 Meq Cr-tabs (Potassium chloride) .... 2 by mouth daily 12)  Pravastatin Sodium 40 Mg Tabs (Pravastatin sodium) .Marland Kitchen.. 1 by mouth daily 13)  Colcrys 0.6 Mg Tabs (Colchicine) .Marland Kitchen.. 1 by mouth two times a day as needed 14)  Allopurinol 300 Mg Tabs (Allopurinol) .... Take 1 tablet by mouth once a day 15)  Lactulose 10 Gm/63ml Soln (Lactulose) .... 2 teaspoons 3 times a day 16)  Ambien 5 Mg Tabs (Zolpidem tartrate) .Marland Kitchen.. 1 by mouth at bedtime as needed 17)  Saw Palmetto Plus Caps (Misc natural products) .... Take 3 tablet by mouth once a day 18)  Calcium D-glucarate 500 Mg Caps (Nutritional supplements) .Marland Kitchen.. 1 per day 19)  Cialis 5 Mg Tabs (Tadalafil) .Marland Kitchen.. 1 by mouth prior to intercourse as needed 20)  Depo-testosterone 200 Mg/ml Oil (Testosterone cypionate)  .... 200mg  im every 3-4 weeks 21)  Metformin Hcl 500 Mg Xr24h-tab (Metformin hcl) .... 4 tabs each am 22)  Glucophage 500 Mg Tabs (Metformin hcl) .... 4 by mouth daily 23)  Prodigy Blood Glucose Monitor Devi (Blood glucose monitoring suppl) .... Use as directed 24)  Prodigy Blood Glucose Test Strp (Glucose blood) .... Check blood sugars two times a day  dx: 250.00 25)  Prodigy Lancets 26g Misc (Lancets) .... Use two times a day  Other Orders: Est. Patient Level III (16109) Spirometry w/Graph (94010) T-2 View CXR (71020TC)  Patient Instructions: 1)  Continue Qvar two puffs two times a day 2)  Follow up in 4 weeks  CXR  Procedure date:  10/18/2009  Findings:      CHEST - 2 VIEW   Comparison: CT chest 09/20/2009.  Plain films of the chest 09/16/2009 and 08/10/2008.   Findings: Lymphadenopathy seen on chest CT is not well demonstrated by plain film.  Lungs appear clear.  Sub centimeter pulmonary nodules shown on chest CT are not seen on this exam.  No pleural effusion.   IMPRESSION: No acute finding.    CardioPerfect Spirometry  ID: [id number] Patient: [name], [name] DOB: 07-Jun-1971 Age: 40 Years Old Sex: Male Race: Black Physician: Newt Lukes MD Height: 75 Weight: 290 Smoker: Yes Status: Unconfirmed Past Medical History:  Gout Hypertension Diabetes mellitus 2 Asthma Rheumatic fever age 17 Arthritis      - ?juvenile rheumatoid arthritis vs sarcoidosis      - followed by Dr. Nickola Major with Rheumatology TIA Peripheral neuropathy Mediastinal lymphadenopathy with biopsy proven sarcoidosis - Dr. Craige Cotta       - CT chest 07/30/08       -CT chest 09/20/09 Interval development of numerous 2- 3 mm pulmonary nodules typical for Sarcoid,  Slight interval decrease in the size of mildly enlarged mediastinal lymph nodes.       - MTX  started 03/01/09 Possible Neurosarcoidis      - Followed by Dr. Craige Cotta with [facility] Narcolepsy without Cataplexy      -  MSLT 01/09/09       - MRI brain 01/29/09 ?neuro-sarcoidosis CAD - cath 06/2009 -nonobst dz     -follows with Dr. Sandria Manly at [facility] hyperlipidemia Recorded: 10/18/2009 11:36 AM  Parameter  Measured Predicted %Predicted FVC     2.98        5.20        57.30 FEV1     2.77        4.27        64.90 FEV1%   92.98        82.66        112.50 PEF    7.12        10.62        67   Interpretation: Moderate to severe restriction.

## 2010-07-08 NOTE — Progress Notes (Signed)
  Phone Note Other Incoming   Request: Send information Summary of Call: Request for records received from [facility]. Request forwarded to [facility].     

## 2010-07-08 NOTE — Assessment & Plan Note (Signed)
Summary: FLU SHOT--SAE--STC  Nurse Visit   Allergies: 1)  ! Sulfa  Orders Added: 1)  Admin 1st Vaccine [90471] 2)  Flu Vaccine 24yrs + [14782]  Flu Vaccine Consent Questions     Do you have a history of severe allergic reactions to this vaccine? no    Any prior history of allergic reactions to egg and/or gelatin? no    Do you have a sensitivity to the preservative Thimersol? no    Do you have a past history of Guillan-Barre Syndrome? no    Do you currently have an acute febrile illness? no    Have you ever had a severe reaction to latex? no    Vaccine information given and explained to patient? yes    Are you currently pregnant? no    Lot Number:[id number]   Exp Date:12/06/2010   Site Given  Left Deltoid IM

## 2010-07-08 NOTE — Progress Notes (Signed)
  Phone Note Outgoing Call   Call placed by: [name] MD,  August 14, 2008 11:09 AM Call placed to: Patient Summary of Call: Discussed results of chest xray and labs.  I discussed that he may also have adenopathy in his abdomen.  I will schedule him for CT abd/pelvis with oral and IV contrast to further evaluate this.  Depending on the results of this will decide what is the best approach for biopsy.

## 2010-07-08 NOTE — Assessment & Plan Note (Signed)
Summary: 3 week return/mhh   Primary Provider/Referring Provider:  Orpah Cobb  CC:  Pt here for follow up on change on Prednisone. Pt states when decreased symptoms returned.  History of Present Illness: I saw Mr. [name] in follow up for his dyspnea, and hilar adenopathy.  He had a recurrence of his symptoms once he got down to prednisone 10 mg.  He was having dyspnea, subjective fevers, and abdominal discomfort.  He increased his prednisone to 20 mg, and has noticed an improvement.  He has been on prednisone 20 mg for the last 2 weeks.    Current Medications (verified): 1)  Prednisone 20 Mg Tabs (Prednisone) .... 2 By Mouth Once Daily 2)  Atenolol 100 Mg Tabs (Atenolol) .Marland Kitchen.. 1 By Mouth Daily 3)  Hydrochlorothiazide 12.5 Mg Tabs (Hydrochlorothiazide) .Marland Kitchen.. 1 By Mouth Daily 4)  Furosemide 40 Mg Tabs (Furosemide) .Marland Kitchen.. 1 By Mouth Daily 5)  Kaon-Cl-10 10 Meq Cr-Tabs (Potassium Chloride) .... 2 By Mouth Daily 6)  Glucophage 500 Mg Tabs (Metformin Hcl) .Marland Kitchen.. 1 By Mouth Two Times A Day 7)  Colcrys 0.6 Mg Tabs (Colchicine) .Marland Kitchen.. 1 By Mouth Two Times A Day As Needed 8)  Allopurinol 300 Mg Tabs (Allopurinol) .... Take 1 Tablet By Mouth Once A Day 9)  Ambien 5 Mg Tabs (Zolpidem Tartrate) .Marland Kitchen.. 1 By Mouth At Bedtime As Needed 10)  Saw Palmetto Plus  Caps (Misc Natural Products) .... Take 3 Tablet By Mouth Once A Day  Allergies (verified): 1)  ! Sulfa  Past History:  Past Medical History: Gout Hypertension Diabetes mellitus Asthma Rheumatic fever age 59 Juvenile rheumatoid arthritis TIA Peripheral neuropathy Mediastinal lymphadenopathy ?sarcoidosis       - CT chest 07/30/08       - 08/10/08 LDH 241, HIV negative, ANA negative, ACE 38, ESR 42, CEA 1.5, RF < 20       - CT abd/pelvis 08/16/08 negative       - non-diagnostic EBUS 08/21/08 with negative cultures  Past Surgical History: Reviewed history from 08/31/2008 and no changes required. Bronchoscopy 08/21/08  Vital  Signs:  Patient profile:   40 year old male Height:      75 inches Weight:      278.25 pounds O2 Sat:      98 % on Room air Temp:     98.3 degrees F oral Pulse rate:   68 / minute BP sitting:   140 / 86  (left arm) Cuff size:   large  Vitals Entered By: Carron Curie CMA (November 06, 2008 4:22 PM)  O2 Flow:  Room air CC: Pt here for follow up on change on Prednisone. Pt states when decreased symptoms returned Comments Medications reviewed with patient Carron Curie CMA  November 06, 2008 4:23 PM    Physical Exam  General:  moon facies, obese Eyes:  PERRLA and EOMI.   Nose:  no deformity, discharge, inflammation, or lesions Mouth:  no deformity or lesions Neck:  no JVD, no thyromegaly Lungs:  decreased breath sounds, no wheezing or rales Heart:  regular rhythm, normal rate, and no murmurs.   Abdomen:  obese, soft, nontender, no masses, normal bowel sounds Extremities:  no edema, cyanosis, or clubbing Cervical Nodes:  no significant adenopathy   Impression & Recommendations:  Problem # 1:  CHEST XRAY, ABNORMAL (ICD-793.1)  Again the primary suspicion is that this represents sarcoidosis.  He had a recurrence of his symptoms once he got down to 10 mg prednisone, that partially  improved once he increased his prednisone back to 20 mg.  I will continue him on prednisone at 20 mg once daily for now.  I will have him repeat his lab tests, and CT chest.  Depending on the results of this he may need to have additional biopsy done.  In addition he has requested assistance in getting primary care referral to monitor his diabetes.  Medications Added to Medication List This Visit: 1)  Prednisone 20 Mg Tabs (Prednisone) .... 2 by mouth once daily 2)  Saw Palmetto Plus Caps (Misc natural products) .... Take 3 tablet by mouth once a day  Complete Medication List: 1)  Prednisone 20 Mg Tabs (Prednisone) .... 2 by mouth once daily 2)  Atenolol 100 Mg Tabs (Atenolol) .Marland Kitchen.. 1 by mouth  daily 3)  Hydrochlorothiazide 12.5 Mg Tabs (Hydrochlorothiazide) .Marland Kitchen.. 1 by mouth daily 4)  Furosemide 40 Mg Tabs (Furosemide) .Marland Kitchen.. 1 by mouth daily 5)  Kaon-cl-10 10 Meq Cr-tabs (Potassium chloride) .... 2 by mouth daily 6)  Glucophage 500 Mg Tabs (Metformin hcl) .Marland Kitchen.. 1 by mouth two times a day 7)  Colcrys 0.6 Mg Tabs (Colchicine) .Marland Kitchen.. 1 by mouth two times a day as needed 8)  Allopurinol 300 Mg Tabs (Allopurinol) .... Take 1 tablet by mouth once a day 9)  Ambien 5 Mg Tabs (Zolpidem tartrate) .Marland Kitchen.. 1 by mouth at bedtime as needed 10)  Saw Palmetto Plus Caps (Misc natural products) .... Take 3 tablet by mouth once a day  Other Orders: TLB-BMP (Basic Metabolic Panel-BMET) (80048-METABOL) TLB-CBC Platelet - w/Differential (85025-CBCD) TLB-Hepatic/Liver Function Pnl (80076-HEPATIC) TLB-TSH (Thyroid Stimulating Hormone) (84443-TSH) Est. Patient Level III (54098) Primary Care Referral (Primary) Radiology Referral (Radiology)  Patient Instructions: 1)  Continue prednisone 20 mg once daily  2)  Lab tests today 3)  Will schedule CT chest 4)  Will try to arrange Primary care referral 5)  Follow up in 2 to 3 weeks

## 2010-07-08 NOTE — Letter (Signed)
Summary: [facility]  [facility]   Imported By: Lester [place] 11/01/2009 10:51:13  _____________________________________________________________________  External Attachment:    Type:   Image     Comment:   External Document

## 2010-07-08 NOTE — Progress Notes (Signed)
Summary: Testosterone/VAL Pt  Phone Note Call from Patient Call back at Home Phone [phone]   Summary of Call: Patient left message on triage that he takes testosterone injections. He noticed a difference with the first injection, but did not notice any difference with the second. Patient notes that he was given some of the testosterone to take home with him, and wonders if he did not receive as much this last time. Patient would like to know what to do please advise. Initial call taken by: Lucious Groves,  November 14, 2009 11:05 AM  Follow-up for Phone Call        the strength is the same;  what he describes is the usual response to starting the testosterone, with an initial "burst" which quickly levels off after that and may not seem to help with more energy or other symptoms Follow-up by: Corwin Levins MD,  November 14, 2009 1:15 PM  Additional Follow-up for Phone Call Additional follow up Details #1::        Pt informed that response was a quick burst with initial dose, dose was the same both times. Pt encouraged to call back with any further questions or concerns Additional Follow-up by: Margaret Pyle, CMA,  November 14, 2009 2:38 PM

## 2010-07-08 NOTE — Progress Notes (Signed)
Summary: unable to sputum test-PT CALLED X 2- WAITING TO HEAR BACK  Phone Note Call from Patient   Caller: Patient Call For: sood Summary of Call: pt is unable to do sputum test need to know alternative. Initial call taken by: Rickard Patience,  January 30, 2010 9:15 AM  Follow-up for Phone Call        called and spoke with pt.   pt states he was just seen by VS on 01-28-2010.  Pt states VS wanted him to do a sputum culture.  Pt states he is unable to produce sputum- states he cannot cough anything up.  Pt states he does notice more increased sob with exertion and at rest and more tightness in chest.  pt wanted to know VS's recs since he is unable to produce sputum. Please advise.  thanks.  Aundra Millet Reynolds LPN  January 30, 2010 9:29 AM   Additional Follow-up for Phone Call Additional follow up Details #1::        pt states signs are worse. waiting to hear from nurse/ dr. [phone]. Tivis Ringer, CNA  January 31, 2010 9:45 AM    Additional Follow-up for Phone Call Additional follow up Details #2::    Can we arrange for him to have induced sputum in respiratory department at either [facility] or [facility]. Follow-up by: [name] MD,  February 04, 2010 7:07 AM  Additional Follow-up for Phone Call Additional follow up Details #3:: Details for Additional Follow-up Action Taken: VS, I spoke with Marcelino Duster at [facility] cardio pulmonary to see if they can do an induced sputum there and was informed by Marcelino Duster and Agustin Cree that this is done through Short Stay.  Will forward message back to VS so he can send order to [facility] for this.  Also, I spoke with pt and he is aware we are trying to work on a way to get his sputum.  pt was ok with this.  Arman Filter LPN  February 04, 2010 10:15 AM   order sent to [facility] [name] MD  February 04, 2010 4:54 PM

## 2010-07-08 NOTE — Progress Notes (Signed)
Summary: ALT med  Phone Note Call from Patient Call back at [facility] Phone [phone]   Caller: Patient Summary of Call: Pt called stating that Cialis 5mg  is not helping with his ED symptoms. Pt is requesting alternate medication but would like to try sample before filling a new prescription. Pt suggest Levitra. *We do not have samples of Levitra* Initial call taken by: Margaret Pyle, CMA,  December 18, 2009 11:16 AM  Follow-up for Phone Call        please let pt know there are no samples of levitra - but can send e-rx for 6 tabs of 10mg  levitra to use as needed if pt would like- (?coupon to pick up here - ok if we have any) -thanks Follow-up by: Newt Lukes MD,  December 18, 2009 12:59 PM  Additional Follow-up for Phone Call Additional follow up Details #1::        Pt advised and agreed to try and will call back to let MD know if it works. There are no coupons for Levitra Additional Follow-up by: Margaret Pyle, CMA,  December 18, 2009 2:05 PM    New/Updated Medications: LEVITRA 10 MG TABS (VARDENAFIL HCL) use as needed 1 hour before intercourse Prescriptions: LEVITRA 10 MG TABS (VARDENAFIL HCL) use as needed 1 hour before intercourse  #6 x 0   Entered by:   Margaret Pyle, CMA   Authorized by:   Newt Lukes MD   Signed by:   Margaret Pyle, CMA on 12/18/2009   Method used:   Electronically to        [facility]       [address]       [place], [place]  [address]       Ph: [phone]       Fax: [phone]   RxID:   [id number]

## 2010-07-08 NOTE — Progress Notes (Signed)
Summary: talk to dr Craige Cotta  Phone Note From Other Clinic Call back at [phone]   Caller: dr Lendon Colonel Call For: dr Craige Cotta Summary of Call: this is a mutual patient of dr Nico Rogness and dr Lendon Colonel. she needs dr Craige Cotta to call her when he gets the chance.  Initial call taken by: Valinda Hoar,  August 28, 2009 12:33 PM  Follow-up for Phone Call        returned call. Follow-up by: [name] MD,  August 28, 2009 1:54 PM

## 2010-07-08 NOTE — Letter (Signed)
Summary: [facility]   Imported By: Sherian Rein 09/26/2009 09:19:15  _____________________________________________________________________  External Attachment:    Type:   Image     Comment:   External Document

## 2010-07-08 NOTE — Progress Notes (Signed)
Summary: results  Phone Note Call from Patient Call back at Home Phone [phone]   Caller: Patient Call For: [name] Summary of Call: pt requests lab results. Initial call taken by: Tivis Ringer,  November 19, 2008 9:05 AM  Follow-up for Phone Call        Pt calling for lab results done on 11/13/08.  Results in EMR unsigned.  Please advise. Thanks.  Cloyde Reams RN  November 19, 2008 10:12 AM   Additional Follow-up for Phone Call Additional follow up Details #1::        Discussed results with patient over the phone.  Thyroid function does not explain his symptoms.  He has persistant adenopathy on CT chest.  I think he needs to have further tissue sampling to get more accurate diagnosis.  I will refer him to Dr. Edwyna Shell to evaluate for mediastinoscopy. Additional Follow-up by: [name] MD,  November 19, 2008 2:07 PM

## 2010-07-08 NOTE — Letter (Signed)
Summary: [facility]   Imported By: Sherian Rein 01/28/2010 14:35:54  _____________________________________________________________________  External Attachment:    Type:   Image     Comment:   External Document

## 2010-07-08 NOTE — Assessment & Plan Note (Signed)
Summary: 4-6 weeks/apc   Primary Provider/Referring Provider:  Orpah Cobb  CC:  6 wk followup.  Pt c/o some wheezing and dry cough x 3 wks. Pt states that his breathing is the same- no better or worse.  Marland Kitchen  History of Present Illness: I saw Mr. Mengel in follow up for his pulmonary sarcoid with possible neurosarcoid, narcolepsy w/o cataplexy  He was started on diltiazem by cardiology.  He no longer has the palpitations since being on a higher dose of this.  He is to have an Echo next week.  He has occasional cough and wheeze.  This improves after he brings up some phlegm.  He denies hemoptysis of fever.  He uses his xopenex about twice per week.  He still gets occasional sweats, but this has improved.  He feels his joints are more stiff, particularly his knees.  This is causing some trouble walking.  He is not having any problems with his level of alterness during the day.   Current Medications (verified): 1)  Prednisone 10 Mg Tabs (Prednisone) .... One By Mouth Once Daily 2)  Methotrexate 2.5 Mg Tabs (Methotrexate Sodium) .... Three By Mouth Once Per Week 3)  Folic Acid 1 Mg Tabs (Folic Acid) .... One By Mouth Once Daily 4)  Atenolol 100 Mg Tabs (Atenolol) .Marland Kitchen.. 1 By Mouth Daily 5)  Hydrochlorothiazide 12.5 Mg Tabs (Hydrochlorothiazide) .Marland Kitchen.. 1 By Mouth Daily 6)  Furosemide 40 Mg Tabs (Furosemide) .Marland Kitchen.. 1 By Mouth Daily 7)  Kaon-Cl-10 10 Meq Cr-Tabs (Potassium Chloride) .... 2 By Mouth Daily 8)  Glucophage 500 Mg Tabs (Metformin Hcl) .Marland Kitchen.. 1 By Mouth Two Times A Day 9)  Colcrys 0.6 Mg Tabs (Colchicine) .Marland Kitchen.. 1 By Mouth Two Times A Day As Needed 10)  Allopurinol 300 Mg Tabs (Allopurinol) .... Take 1 Tablet By Mouth Once A Day 11)  Ambien 5 Mg Tabs (Zolpidem Tartrate) .Marland Kitchen.. 1 By Mouth At Bedtime As Needed 12)  Saw Palmetto Plus  Caps (Misc Natural Products) .... Take 3 Tablet By Mouth Once A Day 13)  Xopenex Hfa 45 Mcg/act Aero (Levalbuterol Tartrate) .... Two Puffs Up To Four Times Per  Day As Needed 14)  Diltiazem Hcl 120 Mg Tabs (Diltiazem Hcl) .Marland Kitchen.. 1 Two Times A Day  Allergies (verified): 1)  ! Sulfa  Past History:  Past Medical History: Reviewed history from 03/01/2009 and no changes required. Gout Hypertension Diabetes mellitus Asthma Rheumatic fever age 83 Juvenile rheumatoid arthritis TIA Peripheral neuropathy Mediastinal lymphadenopathy with biopsy proven sarcoidosis       - CT chest 07/30/08       - MTX started 03/01/09 Narcolepsy without Cataplexy      - MSLT 01/09/09       - MRI brain 01/29/09 ?neuro-sarcoidosis  Past Surgical History: Reviewed history from 12/13/2008 and no changes required. Bronchoscopy 08/21/08 Mediastinoscopy 11/30/08  Family History: Reviewed history from 08/10/2008 and no changes required. Heart disease--father--deceased at age 24  Social History: Reviewed history from 08/10/2008 and no changes required. Married.  Has two daughters.  Currently a Consulting civil engineer.  Worked as a Research officer, trade union.  Vital Signs:  Patient profile:   40 year old male Weight:      302 pounds O2 Sat:      97 % on Room air Temp:     97.9 degrees F oral Pulse rate:   66 / minute BP sitting:   118 / 76  (left arm) Cuff size:   large  Vitals Entered By: Vernie Murders (June 17, 2009 1:40 PM)  O2 Flow:  Room air  Physical Exam  General:  obese.   Nose:  no deformity, discharge, inflammation, or lesions Mouth:  MP 4, no oral lesions Neck:  no JVD, no thyromegaly Lungs:  clear bilaterally to auscultation and percussion Heart:  regular rate and rhythm, S1, S2 without murmurs, rubs, gallops, or clicks Abdomen:  bowel sounds positive; abdomen soft and non-tender without masses, or organomegaly Extremities:  no clubbing, cyanosis, edema, or deformity noted Neurologic:  CN II-XII grossly intact with normal reflexes, coordination, muscle strength and tone Cervical Nodes:  no significant adenopathy   Impression & Recommendations:  Problem # 1:   PULMONARY SARCOIDOSIS (ICD-135) He has improved.  Will start to wean his prednisone.  WIll continue same dose of MTX.  Will check his labs today.  Problem # 2:  NEUROSARCOIDOSIS (ICD-135) He will hopefully be able to see neurology soon after he resolves his finacial issues.  Problem # 3:  NARCOLEPSY WITHOUT CATAPLEXY (ICD-347.00) Not an active issue.    Problem # 4:  CHEST PAIN (ICD-786.50) Improved with tx by cardiology.  Problem # 5:  KNEE PAIN, BILATERAL (ICD-719.46) He likely has osteo-arthritis made worse by his obesity.  I have advised him to d/w his primary physician.  Medications Added to Medication List This Visit: 1)  Prednisone 10 Mg Tabs (Prednisone) .... One every other day and 1/2 every other day for one week, then 1/2 once daily 2)  Diltiazem Hcl 120 Mg Tabs (Diltiazem hcl) .Marland Kitchen.. 1 two times a day  Complete Medication List: 1)  Prednisone 10 Mg Tabs (Prednisone) .... One every other day and 1/2 every other day for one week, then 1/2 once daily 2)  Methotrexate 2.5 Mg Tabs (Methotrexate sodium) .... Three by mouth once per week 3)  Folic Acid 1 Mg Tabs (Folic acid) .... One by mouth once daily 4)  Atenolol 100 Mg Tabs (Atenolol) .Marland Kitchen.. 1 by mouth daily 5)  Hydrochlorothiazide 12.5 Mg Tabs (Hydrochlorothiazide) .Marland Kitchen.. 1 by mouth daily 6)  Furosemide 40 Mg Tabs (Furosemide) .Marland Kitchen.. 1 by mouth daily 7)  Kaon-cl-10 10 Meq Cr-tabs (Potassium chloride) .... 2 by mouth daily 8)  Glucophage 500 Mg Tabs (Metformin hcl) .Marland Kitchen.. 1 by mouth two times a day 9)  Colcrys 0.6 Mg Tabs (Colchicine) .Marland Kitchen.. 1 by mouth two times a day as needed 10)  Allopurinol 300 Mg Tabs (Allopurinol) .... Take 1 tablet by mouth once a day 11)  Ambien 5 Mg Tabs (Zolpidem tartrate) .Marland Kitchen.. 1 by mouth at bedtime as needed 12)  Saw Palmetto Plus Caps (Misc natural products) .... Take 3 tablet by mouth once a day 13)  Xopenex Hfa 45 Mcg/act Aero (Levalbuterol tartrate) .... Two puffs up to four times per day as needed 14)   Diltiazem Hcl 120 Mg Tabs (Diltiazem hcl) .Marland Kitchen.. 1 two times a day  Other Orders: Est. Patient Level III (62130) TLB-BMP (Basic Metabolic Panel-BMET) (80048-METABOL) TLB-CBC Platelet - w/Differential (85025-CBCD) TLB-Hepatic/Liver Function Pnl (80076-HEPATIC)  Patient Instructions: 1)  Prednisone 10 mg every other day and 5 mg every other day for one week, then 5 mg once daily until next follow up 2)  Lab test today 3)  Follow up in 6 weeks

## 2010-07-08 NOTE — Assessment & Plan Note (Signed)
Summary: TESTOSTERONE INJ-LB  Nurse Visit   Allergies: 1)  ! Sulfa  Medication Administration  Injection # 1:    Medication: Testosterone Cypionat 200mg  ing    Diagnosis: TESTICULAR HYPOFUNCTION (ICD-257.2)    Route: IM    Site: RUOQ gluteus    Exp Date: 10/2011    Lot #: [id number]    Mfr: Abraxis    Comments: administered 200mg  (1ml) of testosterone    Patient tolerated injection without complications    Given by: Brenton Grills CMA Duncan Dull) (May 12, 2010 11:45 AM)  Orders Added: 1)  Admin of patients own med IM/SQ [phone]

## 2010-07-08 NOTE — Assessment & Plan Note (Signed)
Summary: 1 MO TEST VL-OYU  Nurse Visit   Allergies: 1)  ! Sulfa  Medication Administration  Injection # 1:    Medication: Testosterone Cypionat 200mg  ing    Diagnosis: TESTICULAR HYPOFUNCTION (ICD-257.2)    Route: IM    Site: LUOQ gluteus    Exp Date: 10/07/2011    Lot #: [id number]    Mfr: Abraxis    Patient tolerated injection without complications    Given by: Margaret Pyle, CMA (December 10, 2009 9:43 AM)  Orders Added: 1)  Admin of Therapeutic Inj  intramuscular or subcutaneous [96372] 2)  Testosterone Cypionat 200mg  ing [J1080]

## 2010-07-08 NOTE — Assessment & Plan Note (Signed)
Summary: Pulmonary/ f/u sarcoid   Copy to:  Dr. Nickola Major, Dr. Craige Cotta, Dr. Royann Shivers Primary Cletis Clack/Referring Lada Fulbright:  Xavier Lukes MD  CC:  Followup.  Pt states that tightness in chest has resolved.  Breathing is the same- no better or worse.  He has had prod cough with yellow sputum x  2 wks- was txed with cipro per his PCP for bladder infection and states that this has helped the cough some.Marland Kitchen  History of Present Illness: 40 yobm quit smoking Jan 2011  with sarcoidosis >  pulmonary and neuro involvement.  January 31, 2010 Acute visit.  Pt c/o tightness in chest x 3 days.  He also c/o dizziness and nausea.  He was seen on 8/23 and had back pain- states this has worsened since then.  Pt also c/o increased sweating- denies any fever.  He gets SOB with exertion- such as from lobby to exam room today. Symptoms progressively worse and attributed to mtx but not due for a dose until the day of ov and has not taken yet much worse last 3 days with multiple complaints,nausea and dry cough/ sob being the worst.   rec Restart prednisone 10 mg 2 daily with bfast until  better then one daily Bystolic 10 mg one daily instead of metaprolol Aciphex 20 mg Take  one 30-60 min before first meal of the day  Famotidine (Pepcid) 40 mg one at bedtime  February 27, 2010 Followup.  Pt states that tightness in chest has resolved.  Breathing is the same- no better or worse.  He has had prod cough with yellow sputum x  2 wks- was txed with cipro per his PCP for bladder infection and states that this has helped the cough some.  no more nausea , no ocular or articular c/os or rash.  Pt denies any significant sore throat, dysphagia, itching, sneezing,  nasal congestion or excess secretions,  fever, chills, sweats, unintended wt loss, pleuritic or exertional cp, hempoptysis, change in activity tolerance  orthopnea pnd or leg swelling     Current Medications (verified): 1)  Xopenex Hfa 45 Mcg/act Aero (Levalbuterol  Tartrate) .... Two Puffs Up To Four Times Per Day As Needed 2)  Methotrexate Sol .Marland Kitchen.. 8cc Injection Weekly Not Yet Started 3)  Folic Acid 1 Mg Tabs (Folic Acid) .... One By Mouth Once Daily 4)  Aspirin 325 Mg Tabs (Aspirin) .Marland Kitchen.. 1 By Mouth Once Daily 5)  Metoprolol Tartrate 50 Mg Tabs (Metoprolol Tartrate) .Marland Kitchen.. 1 Two Times A Day- Ran Out of Byslotic 6)  Hydrochlorothiazide 12.5 Mg Tabs (Hydrochlorothiazide) .Marland Kitchen.. 1 By Mouth Daily 7)  Furosemide 40 Mg Tabs (Furosemide) .Marland Kitchen.. 1 By Mouth Two Times A Day As Needed 8)  Klor-Con M20 20 Meq Cr-Tabs (Potassium Chloride Crys Cr) .Marland Kitchen.. 1 By Mouth Two Times A Day (Or As Directed) 9)  Pravastatin Sodium 40 Mg Tabs (Pravastatin Sodium) .Marland Kitchen.. 1 By Mouth Daily 10)  Colcrys 0.6 Mg Tabs (Colchicine) .Marland Kitchen.. 1 By Mouth Two Times A Day As Needed 11)  Allopurinol 300 Mg Tabs (Allopurinol) .... Take 1 Tablet By Mouth Once A Day 12)  Ambien 5 Mg Tabs (Zolpidem Tartrate) .Marland Kitchen.. 1 By Mouth At Bedtime As Needed 13)  Saw Palmetto Plus  Caps (Misc Natural Products) .... Take 3 Tablet By Mouth Once A Day 14)  Depo-Testosterone 200 Mg/ml Oil (Testosterone Cypionate) .... 200mg  Im Every 3-4 Weeks 15)  Metformin Hcl 500 Mg Xr24h-Tab (Metformin Hcl) .... 4 Tabs Each Am 16)  Prodigy Blood Glucose  Test  Strp (Glucose Blood) .... Check Blood Sugars Two Times A Day  Dx: 250.00 17)  Prodigy Lancets 26g  Misc (Lancets) .... Use Two Times A Day 18)  Levitra 10 Mg Tabs (Vardenafil Hcl) .... Use As Needed 1 Hour Before Intercourse 19)  Pepcid 40 Mg Tabs (Famotidine) .... One At Bedtime 20)  Diltiazem Hcl 60 Mg Tabs (Diltiazem Hcl) .... Take 1 Four Times A Day 21)  Prednisone 10 Mg  Tabs (Prednisone) .Marland Kitchen.. 1 Every Am 22)  Aciphex 20 Mg  Tbec (Rabeprazole Sodium) .... Take  One 30-60 Min Before First Meal of The Day 23)  Tramadol Hcl 50 Mg Tabs (Tramadol Hcl) .... As Needed For Pain (From Dr. Nickola Major)  Allergies (verified): 1)  ! Sulfa  Past History:  Past Medical  History: Gout Hypertension Diabetes mellitus 2 Asthma  Rheumatic fever age 73 Arthritis      - ?juvenile rheumatoid arthritis vs sarcoidosis      - followed by Dr. Nickola Major with Rheumatology TIA   Peripheral neuropathy Mediastinal lymphadenopathy with biopsy proven sarcoidosis.................Marland KitchenDr. Craige Cotta       - CT chest 07/30/08       -CT chest 09/20/09 Interval development of numerous 2- 3 mm pulmonary nodules typical for Sarcoid,  Slight         interval decrease in the size of mildly enlarged mediastinal lymph nodes.       - MTX started 03/01/09 > rec stop February 27, 2010  Possible Neurosarcoidis      - Followed by Dr. Craige Cotta with [facility] Narcolepsy without Cataplexy      - MSLT 01/09/09       - MRI brain 01/29/09 ?neuro-sarcoidosis CAD - cath 06/2009 -nonobst dz     -follows with [facility]. Francina Am[facility] at [facility] hyperlipidemia   Clinical Reports Reviewed:  CXR:  01/31/2010: CXR Results:    1.  Stable radiograph of the chest. 2.  Low lung volumes as before.  11/19/2009: CXR Results:  CHEST - 2 VIEW   Comparison: 10/18/2009 and CT 09/2009   Findings: Low lung volumes are present and taking this into consideration heart size is within normal limits. A stable mediastinal configuration is seen.  The lung fields demonstrate some crowding of bronchovascular markings at both lung bases.  The lung fields are otherwise clear with no signs of focal infiltrate or congestive failure.  Previously noted lymphadenopathy and pulmonary nodularity demonstrated on chest CT are not well appreciated radiographically.   IMPRESSION: Stable cardiopulmonary appearance with unchanged low lung volumes and no new or acute abnormality suggested radiographically  10/18/2009: CXR Results:  CHEST - 2 VIEW   Comparison: CT chest 09/20/2009.  Plain films of the chest 09/16/2009 and 08/10/2008.   Findings: Lymphadenopathy seen on chest CT is not well demonstrated by plain film.   Lungs appear clear.  Sub centimeter pulmonary nodules shown on chest CT are not seen on this exam.  No pleural effusion.   IMPRESSION: No acute finding.  07/22/2009: CXR Results:   Comparison: Portable chest x-ray 06/24/2009.  Two-view chest x-ray   05/14/2009 and 01/06/2006.    Findings: Heart enlarged but stable.  Pulmonary vascularity normal.   Lungs clear.  No pleural effusions.    IMPRESSION:   Stable cardiomegaly.  No acute cardiopulmonary disease.  06/24/2009: CXR Results:   Findings: Normal mediastinum and heart silhouette.  Costophrenic   angles are clear.  No evidence effusion, infiltrate, or   pneumothorax.    IMPRESSION:   No  acute cardiopulmonary process.   Vital Signs:  Patient profile:   40 year old male Weight:      296 pounds O2 Sat:      98 % on Room air Temp:     98.7 degrees F oral Pulse rate:   89 / minute BP sitting:   124 / 78  (left arm) Cuff size:   large  Vitals Entered By: Vernie Murders (February 27, 2010 9:37 AM)  O2 Flow:  Room air  Physical Exam  Additional Exam:  Amb pleasant slt cushingnoid bm nad. wt 194 > 192 January 31, 2010 > 296 February 27, 2010  HEENT:  [place]/AT, , Armed forces training and education officer, TMs-wnl, NOSE-clear discharge  THROAT-clear NECK:  Supple w/ fair ROM; no JVD; normal carotid impulses w/o bruits; no thyromegaly or nodules palpated; no lymphadenopathy. RESP  Clear to P & A; w/o, wheezes/ rales/ or rhonchi. CARD:  RRR, no m/r/g   GI:   Soft & nt; nml bowel sounds; no organomegaly or masses detected. [place]: Warm bil,  no calf tenderness edema, clubbing, pulses intact Neuro: intact w/ no focal deficits noted  Skin: intact w/ no rash.    Impression & Recommendations:  Problem # 1:  PULMONARY SARCOIDOSIS (ICD-135)  The goal with a chronic steroid dependent illness is always arriving at the lowest effective dose that controls the disease/symptoms and not accepting a set "formula" which is based on statistics that don't take into accound  individual variability or the natural hx of the dz in every individual patient, which may well vary over time.   In his case I don't think 0 is the right dose, but it's not really clear because so many of his symptoms are non-specific and could be from many sources, including his 20 or so medications or even gerd.  CXR does not convince me there's any active sarcoid here  Rec a ceiling of Pred 20 and floor of 10 for now and continue off MTX until firm baseline established in terms of steroid need/ dose then consider adding it back if indicated  Medications Added to Medication List This Visit: 1)  Metoprolol Tartrate 50 Mg Tabs (Metoprolol tartrate) .Marland Kitchen.. 1 two times a day- ran out of byslotic 2)  Prednisone 10 Mg Tabs (Prednisone) .Marland Kitchen.. 1 every am  Other Orders: Est. Patient Level IV (47829)  Patient Instructions: 1)  keep appt to see DR Craige Cotta 2)  Stop methotrexate for now 3)  Ceiling for prednisone is 20 mg and floor 10 mg per day Prescriptions: ACIPHEX 20 MG  TBEC (RABEPRAZOLE SODIUM) Take  one 30-60 min before first meal of the day  #34 x 3   Entered and Authorized by:   Nyoka Cowden MD   Signed by:   Nyoka Cowden MD on 02/27/2010   Method used:   Electronically to        [facility]       [address]       [place], [place]  [address]       Ph: [phone]       Fax: [phone]   RxID:   [id number] PREDNISONE 10 MG  TABS (PREDNISONE) 1 EVERY AM  #100 x 0   Entered and Authorized by:   Nyoka Cowden MD   Signed by:   Nyoka Cowden MD on 02/27/2010   Method used:   [facility]        [facility]       [address]       [place], [place]  [address]       Ph: [phone]       Fax: [phone]   RxID:   [id number]

## 2010-07-08 NOTE — Progress Notes (Signed)
Summary: fyi  Phone Note Call from Patient   Caller: Patient Call For: Shariya Gaster Summary of Call: pt had mri done on 07/23/2009 Initial call taken by: Rickard Patience,  July 24, 2009 4:26 PM  Follow-up for Phone Call        pt just wanted VS to know about MRI in case he wanted to review. Carron Curie CMA  July 24, 2009 4:34 PM   Additional Follow-up for Phone Call Additional follow up Details #1::        d/w pt.  He had acute confusional event few days ago, and was evaluated in ED.  He was sent home.  He was evaluated by PCP, and had CT head w/o contrast on Feb 15 which was normal.  He has insurance approval, and will hopefully be getting set up for neurology evaluation soon. Additional Follow-up by: [name] MD,  July 24, 2009 4:41 PM

## 2010-07-08 NOTE — Assessment & Plan Note (Signed)
Summary: rov 4 wks ///kp   Copy to:  Dr. Nickola Major, Dr. Craige Cotta, Dr. Royann Shivers Primary Provider/Referring Provider:  Newt Lukes MD  CC:  4 week followup, sob the same, and no improvements.  Cough non productive occasionally.  History of Present Illness: 40 yo male with sarcoidosis with pulmonary and neuro involvement.  He has not noticed any improvement in his breathing.  He still gets occasional wheeze and dry cough.  He gets a tight feeling in his chest at times.  He denies fever or hemoptysis.  He uses xopenex 3 or 4 times per day, but he is not sure this is doing anything.  He is now up to 9 pills of MTX per week, and is off prednisone.   Current Medications (verified): 1)  Qvar 80 Mcg/act Aers (Beclomethasone Dipropionate) .... 2 Puffs Two Times A Day 2)  Xopenex Hfa 45 Mcg/act Aero (Levalbuterol Tartrate) .... Two Puffs Up To Four Times Per Day As Needed 3)  Methotrexate 2.5 Mg Tabs (Methotrexate Sodium) .... Seven By Mouth Once Per Week 4)  Folic Acid 1 Mg Tabs (Folic Acid) .... One By Mouth Once Daily 5)  Aspirin 325 Mg Tabs (Aspirin) .Marland Kitchen.. 1 By Mouth Once Daily 6)  Metoprolol Tartrate 50 Mg Tabs (Metoprolol Tartrate) .... One Two Times A Day 7)  Diltiazem Hcl Er Beads 240 Mg Xr24h-Cap (Diltiazem Hcl Er Beads) .Marland Kitchen.. 1 By Mouth Once Daily 8)  Hydrochlorothiazide 12.5 Mg Tabs (Hydrochlorothiazide) .Marland Kitchen.. 1 By Mouth Daily 9)  Furosemide 40 Mg Tabs (Furosemide) .Marland Kitchen.. 1 By Mouth Daily 10)  Kaon-Cl-10 10 Meq Cr-Tabs (Potassium Chloride) .... 2 By Mouth Daily 11)  Pravastatin Sodium 40 Mg Tabs (Pravastatin Sodium) .Marland Kitchen.. 1 By Mouth Daily 12)  Colcrys 0.6 Mg Tabs (Colchicine) .Marland Kitchen.. 1 By Mouth Two Times A Day As Needed 13)  Allopurinol 300 Mg Tabs (Allopurinol) .... Take 1 Tablet By Mouth Once A Day 14)  Lactulose 10 Gm/62ml Soln (Lactulose) .... 2 Teaspoons 3 Times A Day 15)  Ambien 5 Mg Tabs (Zolpidem Tartrate) .Marland Kitchen.. 1 By Mouth At Bedtime As Needed 16)  Saw Palmetto Plus  Caps (Misc Natural  Products) .... Take 3 Tablet By Mouth Once A Day 17)  Calcium D-Glucarate 500 Mg Caps (Nutritional Supplements) .Marland Kitchen.. 1 Per Day 18)  Depo-Testosterone 200 Mg/ml Oil (Testosterone Cypionate) .... 200mg  Im Every 3-4 Weeks 19)  Metformin Hcl 500 Mg Xr24h-Tab (Metformin Hcl) .... 4 Tabs Each Am 20)  Prodigy Blood Glucose Test  Strp (Glucose Blood) .... Check Blood Sugars Two Times A Day  Dx: 250.00 21)  Prodigy Lancets 26g  Misc (Lancets) .... Use Two Times A Day 22)  Levitra 10 Mg Tabs (Vardenafil Hcl) .... Use As Needed 1 Hour Before Intercourse  Allergies: 1)  ! Sulfa  Past History:  Past Medical History: Gout Hypertension Diabetes mellitus 2 Asthma Rheumatic fever age 55 Arthritis      - ?juvenile rheumatoid arthritis vs sarcoidosis      - followed by Dr. Nickola Major with Rheumatology TIA Peripheral neuropathy Mediastinal lymphadenopathy with biopsy proven sarcoidosis - Dr. Craige Cotta       - CT chest 07/30/08       -CT chest 09/20/09 Interval development of numerous 2- 3 mm pulmonary nodules typical for Sarcoid,  Slight         interval decrease in the size of mildly enlarged mediastinal lymph nodes.       - MTX started 03/01/09 Possible Neurosarcoidis      - Followed  by Dr. Craige Cotta with [facility] Narcolepsy without Cataplexy      - MSLT 01/09/09       - MRI brain 01/29/09 ?neuro-sarcoidosis CAD - cath 06/2009 -nonobst dz     -follows with Dr. Royann Shivers at [facility] hyperlipidemia  Past Surgical History: Reviewed history from 12/13/2008 and no changes required. Bronchoscopy 08/21/08 Mediastinoscopy 11/30/08  Vital Signs:  Patient profile:   40 year old male Height:      75 inches Weight:      296 pounds BMI:     37.13 O2 Sat:      98 % on Room air Temp:     98.2 degrees F oral Pulse rate:   93 / minute BP sitting:   118 / 72  (right arm) Cuff size:   large  Vitals Entered By: Kandice Hams CMA (December 31, 2009 2:55 PM)  O2 Flow:  Room air  Physical  Exam  General:  obese.   Nose:  no deformity, discharge, inflammation, or lesions Mouth:  no exudate Neck:  no JVD.   Lungs:  diminished breath sounds, no wheeze or rales Heart:  regular rate and rhythm, S1, S2 without murmurs, rubs, gallops, or clicks Extremities:  minimal ankle edema Neurologic:  strength normal.   Cervical Nodes:  no significant adenopathy   Impression & Recommendations:  Problem # 1:  DYSPNEA (ICD-786.05) I am not sure what is causing his persistent dyspnea.  He has not improved with increased prednisone dose or methotrexate dose.  He has not noticed much improvement with inhaler therapy.  I will repeat his CT chest with IV contrast to further determine if his sarcoidosis is to explain for his persistent dyspnea.  Some of his complaints could also be related to obesity and deconditioning.  He may also need further cardiac evaluation.  If his symptoms persist w/o explaination, he may need to have a cardio-pulmononary stress test.  Problem # 2:  PULMONARY SARCOIDOSIS (ICD-135) He is to continue on MTX per rheumatology.  Will assess this further after review of his CT chest.  Problem # 3:  ASTHMA (ICD-493.90) He has been on high dose ICS for possible asthma, but has not noticed any improvement.  Will stop Qvar.  He is to continue as needed xopenex.  Will re-assess inhaler therapy after review of his CT chest.  Medications Added to Medication List This Visit: 1)  Methotrexate 2.5 Mg Tabs (Methotrexate sodium) .... Nine by mouth once per week  Complete Medication List: 1)  Xopenex Hfa 45 Mcg/act Aero (Levalbuterol tartrate) .... Two puffs up to four times per day as needed 2)  Methotrexate 2.5 Mg Tabs (Methotrexate sodium) .... Nine by mouth once per week 3)  Folic Acid 1 Mg Tabs (Folic acid) .... One by mouth once daily 4)  Aspirin 325 Mg Tabs (Aspirin) .Marland Kitchen.. 1 by mouth once daily 5)  Metoprolol Tartrate 50 Mg Tabs (Metoprolol tartrate) .... One two times a day 6)   Diltiazem Hcl Er Beads 240 Mg Xr24h-cap (Diltiazem hcl er beads) .Marland Kitchen.. 1 by mouth once daily 7)  Hydrochlorothiazide 12.5 Mg Tabs (Hydrochlorothiazide) .Marland Kitchen.. 1 by mouth daily 8)  Furosemide 40 Mg Tabs (Furosemide) .Marland Kitchen.. 1 by mouth two times a day (or as directed) 9)  Klor-con M20 20 Meq Cr-tabs (Potassium chloride crys cr) .Marland Kitchen.. 1 by mouth two times a day (or as directed) 10)  Pravastatin Sodium 40 Mg Tabs (Pravastatin sodium) .Marland Kitchen.. 1 by mouth daily 11)  Colcrys 0.6 Mg Tabs (Colchicine) .Marland KitchenMarland KitchenMarland Kitchen  1 by mouth two times a day as needed 12)  Allopurinol 300 Mg Tabs (Allopurinol) .... Take 1 tablet by mouth once a day 13)  Lactulose 10 Gm/66ml Soln (Lactulose) .... 2 teaspoons 3 times a day 14)  Ambien 5 Mg Tabs (Zolpidem tartrate) .Marland Kitchen.. 1 by mouth at bedtime as needed 15)  Saw Palmetto Plus Caps (Misc natural products) .... Take 3 tablet by mouth once a day 16)  Calcium D-glucarate 500 Mg Caps (Nutritional supplements) .Marland Kitchen.. 1 per day 17)  Depo-testosterone 200 Mg/ml Oil (Testosterone cypionate) .... 200mg  im every 3-4 weeks 18)  Metformin Hcl 500 Mg Xr24h-tab (Metformin hcl) .... 4 tabs each am 19)  Prodigy Blood Glucose Test Strp (Glucose blood) .... Check blood sugars two times a day  dx: 250.00 20)  Prodigy Lancets 26g Misc (Lancets) .... Use two times a day 21)  Levitra 10 Mg Tabs (Vardenafil hcl) .... Use as needed 1 hour before intercourse 22)  Pepcid 40 Mg Tabs (Famotidine) .Marland Kitchen.. 1 by mouth once daily  Other Orders: Est. Patient Level III (88416) Radiology Referral (Radiology)  Patient Instructions: 1)  Stop qvar 2)  Continue xopenex two puffs up to four times per as needed 3)  Will schedule CT chest 4)  Follow up in 4 to 6 weeks

## 2010-07-08 NOTE — Letter (Signed)
Summary: Obie Dredge MD  Obie Dredge MD   Imported By: Lester Tickfaw 01/30/2010 07:24:59  _____________________________________________________________________  External Attachment:    Type:   Image     Comment:   External Document

## 2010-07-08 NOTE — Assessment & Plan Note (Signed)
Summary: 3 weeks/ mbw   Primary Provider/Referring Provider:  Algie [name]  CC:  Pt here for follow-up. Pt c/o trembling in his left hand and trouble sleeping. Pt states this has been happening since being on prednisone. Pt states his cough and breathing are much better Pt also states he has no glucose meter to check his glucose levels and request we check that today...  History of Present Illness: I saw [name] in follow up for his dyspnea, and hilar adenopathy.  His cough has improved.  He is no longer having night sweats.  He still gets some shakes, but these are better.  He is taking two prednisone pills per day at present.  His weight has been stable.  He is sleeping better with decrease in dose of prednisone.  CT chest from 07/30/08 showed hilar and mediastinal adenopathy.  Labs from 08/10/08: LDH 241, HIV negative, ANA negative, ACE 38, ESR 42, CEA 1.5, beta HCG < 0.50, RF < 20.  CT abd/pelvis 08/16/08 was negative.  Bronchoscopy 08/21/08 ?granuloma Lt upper lobe bx, LN FNA negative, cultures negative to date.  Current Medications (verified): 1)  Colcrys 0.6 Mg Tabs (Colchicine) .Marland Kitchen.. 1 By Mouth Two Times A Day As Needed 2)  Atenolol 100 Mg Tabs (Atenolol) .Marland Kitchen.. 1 By Mouth Daily 3)  Glucophage 500 Mg Tabs (Metformin Hcl) .Marland Kitchen.. 1 By Mouth Two Times A Day 4)  Hydrochlorothiazide 12.5 Mg Tabs (Hydrochlorothiazide) .Marland Kitchen.. 1 By Mouth Daily 5)  Kaon-Cl-10 10 Meq Cr-Tabs (Potassium Chloride) .... 2 By Mouth Daily 6)  Furosemide 40 Mg Tabs (Furosemide) .Marland Kitchen.. 1 By Mouth Daily 7)  Prednisone 20 Mg Tabs (Prednisone) .... 3 By Mouth Once Daily 8)  Ambien 5 Mg Tabs (Zolpidem Tartrate) .Marland Kitchen.. 1 By Mouth At Bedtime As Needed 9)  Allopurinol 300 Mg Tabs (Allopurinol) .... Take 1 Tablet By Mouth Once A Day  Allergies (verified): 1)  ! Sulfa  Past History:  Past Medical History:    Reviewed history from 08/10/2008 and no changes required:    Gout    Hypertension    Diabetes mellitus    Asthma    Rheumatic fever age 8    Juvenile rheumatoid arthritis    TIA    Peripheral neuropathy  Past Surgical History:    Reviewed history from 08/31/2008 and no changes required:    Bronchoscopy 08/21/08  Vital Signs:  Patient profile:   40 year old male Height:      75 inches Weight:      266.50 pounds O2 Sat:      96 % Temp:     97.9 degrees F oral Pulse rate:   81 / minute BP sitting:   128 / 86  (right arm) Cuff size:   regular  Vitals Entered By: [name] CMA (October 02, 2008 4:29 PM)  O2 Sat at Rest %:  96 O2 Flow:  room air CC: Pt here for follow-up. Pt c/o trembling in his left hand, trouble sleeping. Pt states this has been happening since being on prednisone. Pt states his cough and breathing are much better Pt also states he has no glucose meter to check his glucose levels and request we check that today.. Comments Medications reviewed with patient [name] CMA  October 02, 2008 4:33 PM    Physical Exam  General:  healthy appearing and obese.   Nose:  no deformity, discharge, inflammation, or lesions Mouth:  no deformity or lesions Neck:  no JVD, no thyromegaly Lungs:  decreased  breath sounds, no wheezing or rales Heart:  regular rhythm, normal rate, and no murmurs.   Abdomen:  obese, soft, nontender, no masses, normal bowel sounds Extremities:  no edema, cyanosis, or clubbing Cervical Nodes:  no significant adenopathy   Impression & Recommendations:  Problem # 1:  CHEST XRAY, ABNORMAL (ICD-793.1) Again the primary suspicion is that this represents sarcoidosis.  He has improved clinically with prednisone.  I will begin to taper the dose.  He is to get a chest xray and labs today.  Depending upon these as well as his response to prednisone taper I will determine if he needs to undergo further biopsy.  Medications Added to Medication List This Visit: 1)  Allopurinol 300 Mg Tabs (Allopurinol) .... Take 1 tablet by mouth once a day  Complete  Medication List: 1)  Prednisone 20 Mg Tabs (Prednisone) .... 3 by mouth once daily 2)  Atenolol 100 Mg Tabs (Atenolol) .Marland Kitchen.. 1 by mouth daily 3)  Hydrochlorothiazide 12.5 Mg Tabs (Hydrochlorothiazide) .Marland Kitchen.. 1 by mouth daily 4)  Furosemide 40 Mg Tabs (Furosemide) .Marland Kitchen.. 1 by mouth daily 5)  Kaon-cl-10 10 Meq Cr-tabs (Potassium chloride) .... 2 by mouth daily 6)  Glucophage 500 Mg Tabs (Metformin hcl) .Marland Kitchen.. 1 by mouth two times a day 7)  Colcrys 0.6 Mg Tabs (Colchicine) .Marland Kitchen.. 1 by mouth two times a day as needed 8)  Allopurinol 300 Mg Tabs (Allopurinol) .... Take 1 tablet by mouth once a day 9)  Ambien 5 Mg Tabs (Zolpidem tartrate) .Marland Kitchen.. 1 by mouth at bedtime as needed  Other Orders: TLB-BMP (Basic Metabolic Panel-BMET) (80048-METABOL) TLB-CBC Platelet - w/Differential (85025-CBCD) TLB-Hepatic/Liver Function Pnl (80076-HEPATIC) T-2 View CXR, Same Day (71020.5TC) Est. Patient Level II (16109)  Patient Instructions: 1)  Chest xray today 2)  Lab work today 3)  Prednisone 30 mg once daily for one week, then 20 mg once daily for one week, then 10 mg once daily until follow up 4)  Follow up in 3 to 4 weeks

## 2010-07-08 NOTE — Assessment & Plan Note (Signed)
Summary: new endo/leschber/notes on emr/ diabetes/cd   Vital Signs:  Patient profile:   40 year old male Height:      75 inches (190.50 cm) Weight:      290.25 pounds (131.93 kg) O2 Sat:      96 % on Room air Temp:     98.2 degrees F (36.78 degrees C) oral Pulse rate:   96 / minute BP sitting:   104 / 68  (left arm) Cuff size:   large  Vitals Entered By: Josph Macho RMA (Oct 17, 2009 2:38 PM)  O2 Flow:  Room air CC: New Endo: Diabetes/ CF Is Patient Diabetic? Yes   Referring Provider:  Newt Lukes Primary Provider:  Newt Lukes MD  CC:  New Endo: Diabetes/ CF.  History of Present Illness: pt states 2 years h/o dm.  it is complicated by cad, and he has peripheral neuropathy.  he has never been on insulin.  he takes metformin.  no cbg record, but states cbg's are low-100's (it was lower than this before he went back on prednisone for sarcoid 1 month ago) pt says his diet and exercise are both "improved."   symptomatically, pt states 1 year of moderate pain of the legs, and associated numbness.  A Current Medications (verified): 1)  Methotrexate 2.5 Mg Tabs (Methotrexate Sodium) .... Three By Mouth Once Per Week 2)  Folic Acid 1 Mg Tabs (Folic Acid) .... One By Mouth Once Daily 3)  Atenolol 100 Mg Tabs (Atenolol) .Marland Kitchen.. 1 By Mouth Daily 4)  Diltiazem Hcl Er Beads 240 Mg Xr24h-Cap (Diltiazem Hcl Er Beads) .Marland Kitchen.. 1 By Mouth Once Daily 5)  Hydrochlorothiazide 12.5 Mg Tabs (Hydrochlorothiazide) .Marland Kitchen.. 1 By Mouth Daily 6)  Furosemide 40 Mg Tabs (Furosemide) .Marland Kitchen.. 1 By Mouth Daily 7)  Kaon-Cl-10 10 Meq Cr-Tabs (Potassium Chloride) .... 2 By Mouth Daily 8)  Pravastatin Sodium 40 Mg Tabs (Pravastatin Sodium) .Marland Kitchen.. 1 By Mouth Daily 9)  Glucophage 500 Mg Tabs (Metformin Hcl) .Marland Kitchen.. 1 By Mouth Two Times A Day 10)  Colcrys 0.6 Mg Tabs (Colchicine) .Marland Kitchen.. 1 By Mouth Two Times A Day As Needed 11)  Allopurinol 300 Mg Tabs (Allopurinol) .... Take 1 Tablet By Mouth Once A Day 12)   Lactulose 10 Gm/45ml Soln (Lactulose) .... 2 Teaspoons 3 Times A Day 13)  Ambien 5 Mg Tabs (Zolpidem Tartrate) .Marland Kitchen.. 1 By Mouth At Bedtime As Needed 14)  Saw Palmetto Plus  Caps (Misc Natural Products) .... Take 3 Tablet By Mouth Once A Day 15)  Xopenex Hfa 45 Mcg/act Aero (Levalbuterol Tartrate) .... Two Puffs Up To Four Times Per Day As Needed 16)  Calcium D-Glucarate 500 Mg Caps (Nutritional Supplements) .Marland Kitchen.. 1 Per Day 17)  Prednisone 10 Mg Tabs (Prednisone) .... 2 Tabs By Mouth Once Daily As Directed By Dr .Lendon Colonel 18)  Qvar 80 Mcg/act Aers (Beclomethasone Dipropionate) .... 2 Puffs Two Times A Day 19)  Cialis 5 Mg Tabs (Tadalafil) .Marland Kitchen.. 1 By Mouth Prior To Intercourse As Needed 20)  Aspirin 325 Mg Tabs (Aspirin) .Marland Kitchen.. 1 By Mouth Once Daily 21)  Depo-Testosterone 200 Mg/ml Oil (Testosterone Cypionate) .... 200mg  Im Every 3-4 Weeks 22)  Prodigy Blood Glucose Monitor  Devi (Blood Glucose Monitoring Suppl) .... Use As Directed 23)  Prodigy Blood Glucose Test  Strp (Glucose Blood) .... Check Blood Sugars Two Times A Day  Dx: 250.00 24)  Prodigy Lancets 26g  Misc (Lancets) .... Use Two Times A Day  Allergies (verified): 1)  !  Sulfa  Past History:  Past Medical History: Last updated: 10/07/2009 Gout Hypertension Diabetes mellitus 2 Asthma Rheumatic fever age 55 Arthritis      - ?juvenile rheumatoid arthritis vs sarcoidosis      - followed by Dr. Nickola Major with Rheumatology TIA Peripheral neuropathy Mediastinal lymphadenopathy with biopsy proven sarcoidosis - Dr. Craige Cotta       - CT chest 07/30/08       -CT chest 09/20/09 Interval development of numerous 2- 3 mm pulmonary nodules typical for Sarcoid,  Slight interval decrease in the size of mildly enlarged mediastinal lymph nodes.       - MTX started 03/01/09 Possible Neurosarcoidis      - Followed by Dr. Craige Cotta with [facility] Narcolepsy without Cataplexy      - MSLT 01/09/09       - MRI brain 01/29/09 ?neuro-sarcoidosis CAD - cath  06/2009 -nonobst dz     -follows with Dr. Sandria Manly at [facility]  Family History: Reviewed history from 08/10/2008 and no changes required. Heart disease--father--deceased at age 65 dm:  both parents.  Social History: Reviewed history from 10/07/2009 and no changes required. Married., lives with wife and 3 kids currently unemployed - prev worked as a Research officer, trade union, the security at [employer]. smoker - quit briefly 07/2008 no alcohol use.  Review of Systems       The patient complains of weight loss, weight gain, and headaches.         denies blurry vision, n/v, memory loss, depression, rhinorrhea, and easy bruising.  chest pain and sob have been attributed to the sarcoidosis.  he has urinary frequency, excessive diaphoresis, and muscle cramps at night.  he has erectile dysfunction.   Physical Exam  General:  obese.  no distress  Head:  head: no deformity eyes: no periorbital swelling, no proptosis external nose and ears are normal mouth: no lesion seen Neck:  Supple without thyroid enlargement or tenderness.  Lungs:  Clear to auscultation bilaterally. Normal respiratory effort.  Heart:  Regular rate and rhythm without murmurs or gallops noted. Normal S1,S2.   Abdomen:  abdomen is soft, nontender.  no hepatosplenomegaly.   not distended.  no hernia  Msk:  muscle bulk and strength are grossly normal.  no obvious joint swelling.  gait is normal and steady  Pulses:  dorsalis pedis intact bilat.  no carotid bruit  Extremities:  no deformity.  no ulcer on the feet.  feet are of normal color and temp.  no edema  Neurologic:  cn 2-12 grossly intact.   readily moves all 4's.   sensation is intact to touch on the feet  Skin:  normal texture and temp.  no rash.  not diaphoretic there is slight folliculitis at the back of the neck Cervical Nodes:  No significant adenopathy.  Psych:  Alert and cooperative; normal mood and affect; normal attention span and concentration.     Additional Exam:  test results are reviewed: Hemoglobin A1C       [H]  6.8 %   Impression & Recommendations:  Problem # 1:  DIABETES MELLITUS, TYPE II (ICD-250.00) as it seems he can tolerate increased rx, he should try  Problem # 2:  POLYNEUROPATHY (ICD-357.9) in view of the fact that his control has never been bad, this is unlikely to be caused by the dm.  Problem # 3:  leg pain prob due to #2  Medications Added to Medication List This Visit: 1)  Metformin Hcl 500 Mg Xr24h-tab (Metformin  hcl) .... 4 tabs each am  Other Orders: Consultation Level IV (17616)  Patient Instructions: 1)  good diet and exercise habits significanly improve the control of your diabetes.  please let me know if you wish to be referred to a dietician.  high blood sugar is very risky to your health.  you should see an eye doctor every year. 2)  controlling your blood pressure and cholesterol drastically reduces the damage diabetes does to your body.  this also applies to quitting smoking.  please discuss these with your doctor.  you should take an aspirin every day, unless you have been advised by a doctor not to. 3)  check your blood sugar 1 time a day.  vary the time of day when you check, between before the 3 meals, and at bedtime.  also check if you have symptoms of your blood sugar being too high or too low.  please keep a record of the readings and bring it to your next appointment here.  please call us sooner if you are having low blood sugar episodes. 4)  increase metformin to -xr 4x500 mg each am 5)  recheck a1c in 3 months 250.60 6)  return 6 months Prescriptions: METFORMIN HCL 500 MG XR24H-TAB (METFORMIN HCL) 4 tabs each am  #120 x 11   Entered and Authorized by:   Minus Breeding MD   Signed by:   Minus Breeding MD on 10/17/2009   Method used:   Electronically to        [facility]       [address]       [place], [place]  [address]       Ph:  [phone]       Fax: [phone]   RxID:   [id number]

## 2010-07-08 NOTE — Assessment & Plan Note (Signed)
Summary: SOB///kp   Primary Provider/Referring Provider:  Orpah Cobb  CC:  Acute visit for SOB. The patient c/o increased sob and productive and non-productive cough andchest pain and night sweats and ankle swelling x2 weeks. The patient was at [facility] as inpatient in January 2011 for sob and chest pain. A cardiac cath was performed.Marland Kitchen  History of Present Illness: I saw [name] in follow up for his pulmonary sarcoid with possible neurosarcoid, narcolepsy w/o cataplexy  He was recently hospitalized for chest pain.  He had a cardiac catheterization, and was recommended for medical management.    He has since had intermittent cough.  He has been getting nausea and some episodes of vomiting.  He has been getting worse pain in his shoulders, and hips.  He has been getting some swelling in his ankles.  He has also noticed some problems passing urine.  He was seen in the ED for this, but his urine studies were unremarkable.  He had a normal chest xray from January 17.  Current Medications (verified): 1)  Prednisone 10 Mg Tabs (Prednisone) .... One Every Other Day and 1/2 Every Other Day For One Week, Then 1/2 Once Daily 2)  Methotrexate 2.5 Mg Tabs (Methotrexate Sodium) .... Three By Mouth Once Per Week 3)  Folic Acid 1 Mg Tabs (Folic Acid) .... One By Mouth Once Daily 4)  Atenolol 100 Mg Tabs (Atenolol) .Marland Kitchen.. 1 By Mouth Daily 5)  Hydrochlorothiazide 12.5 Mg Tabs (Hydrochlorothiazide) .Marland Kitchen.. 1 By Mouth Daily 6)  Furosemide 40 Mg Tabs (Furosemide) .Marland Kitchen.. 1 By Mouth Daily 7)  Kaon-Cl-10 10 Meq Cr-Tabs (Potassium Chloride) .... 2 By Mouth Daily 8)  Glucophage 500 Mg Tabs (Metformin Hcl) .Marland Kitchen.. 1 By Mouth Two Times A Day 9)  Colcrys 0.6 Mg Tabs (Colchicine) .Marland Kitchen.. 1 By Mouth Two Times A Day As Needed 10)  Allopurinol 300 Mg Tabs (Allopurinol) .... Take 1 Tablet By Mouth Once A Day 11)  Ambien 5 Mg Tabs (Zolpidem Tartrate) .Marland Kitchen.. 1 By Mouth At Bedtime As Needed 12)  Saw Palmetto Plus  Caps (Misc Natural  Products) .... Take 3 Tablet By Mouth Once A Day 13)  Xopenex Hfa 45 Mcg/act Aero (Levalbuterol Tartrate) .... Two Puffs Up To Four Times Per Day As Needed 14)  Diltiazem Hcl 120 Mg Tabs (Diltiazem Hcl) .Marland Kitchen.. 1 Two Times A Day 15)  Pravastatin Sodium 40 Mg Tabs (Pravastatin Sodium) .Marland Kitchen.. 1 By Mouth Daily  Allergies (verified): 1)  ! Sulfa  Past History:  Past Medical History: Reviewed history from 03/01/2009 and no changes required. Gout Hypertension Diabetes mellitus Asthma Rheumatic fever age 28 Juvenile rheumatoid arthritis TIA Peripheral neuropathy Mediastinal lymphadenopathy with biopsy proven sarcoidosis       - CT chest 07/30/08       - MTX started 03/01/09 Narcolepsy without Cataplexy      - MSLT 01/09/09       - MRI brain 01/29/09 ?neuro-sarcoidosis  Past Surgical History: Reviewed history from 12/13/2008 and no changes required. Bronchoscopy 08/21/08 Mediastinoscopy 11/30/08  Family History: Reviewed history from 08/10/2008 and no changes required. Heart disease--father--deceased at age 20  Social History: Reviewed history from 08/10/2008 and no changes required. Married.  Has two daughters.  Currently a Consulting civil engineer.  Worked as a Research officer, trade union.  Vital Signs:  Patient profile:   40 year old male Height:      75 inches (190.50 cm) Weight:      299 pounds (135.91 kg) BMI:     37.51 O2 Sat:  98 % on Room air Temp:     97.9 degrees F (36.61 degrees C) oral Pulse rate:   76 / minute BP sitting:   128 / 78  (left arm) Cuff size:   large  Vitals Entered By: Michel Bickers CMA (July 11, 2009 9:50 AM)  O2 Sat at Rest %:  98 O2 Flow:  Room air  CXR  Procedure date:  06/24/2009  Findings:       Findings: Normal mediastinum and heart silhouette.  Costophrenic   angles are clear.  No evidence effusion, infiltrate, or   pneumothorax.    IMPRESSION:   No acute cardiopulmonary process.    Physical Exam  General:  obese.   Eyes:  PERRLA and EOMI.   Nose:   no deformity, discharge, inflammation, or lesions Mouth:  MP 4, no oral lesions Neck:  no JVD, no thyromegaly Lungs:  clear bilaterally to auscultation and percussion Heart:  regular rate and rhythm, S1, S2 without murmurs, rubs, gallops, or clicks Abdomen:  bowel sounds positive; abdomen soft and non-tender without masses, or organomegaly Extremities:  no clubbing, cyanosis, edema, or deformity noted Cervical Nodes:  no significant adenopathy   Impression & Recommendations:  Problem # 1:  PULMONARY SARCOIDOSIS (ICD-135) His last several chest xrays have been unremarkable.  I am not convinced that his current symptoms are related to his pulmonary sarcoid.  He is to continue his current dose of prednisone and methotrexate for now.  Problem # 2:  NEUROSARCOIDOSIS (ICD-135) He will hopefully be able to see neurology later this month.  Will defer whether he needs repeat MRI to neurology.  Problem # 3:  ARTHRITIS (ICD-716.90) I am concerned that he may have a secondary process besides his sarcoidosis causing his current symptom constellation.  He has prior history of arthritis.  I will refer him to rheumatology for further evaluation.  Medications Added to Medication List This Visit: 1)  Prednisone 10 Mg Tabs (Prednisone) .... 1/2 pill once daily 2)  Pravastatin Sodium 40 Mg Tabs (Pravastatin sodium) .Marland Kitchen.. 1 by mouth daily  Complete Medication List: 1)  Prednisone 10 Mg Tabs (Prednisone) .... 1/2 pill once daily 2)  Methotrexate 2.5 Mg Tabs (Methotrexate sodium) .... Three by mouth once per week 3)  Folic Acid 1 Mg Tabs (Folic acid) .... One by mouth once daily 4)  Atenolol 100 Mg Tabs (Atenolol) .Marland Kitchen.. 1 by mouth daily 5)  Hydrochlorothiazide 12.5 Mg Tabs (Hydrochlorothiazide) .Marland Kitchen.. 1 by mouth daily 6)  Furosemide 40 Mg Tabs (Furosemide) .Marland Kitchen.. 1 by mouth daily 7)  Kaon-cl-10 10 Meq Cr-tabs (Potassium chloride) .... 2 by mouth daily 8)  Glucophage 500 Mg Tabs (Metformin hcl) .Marland Kitchen.. 1 by mouth  two times a day 9)  Colcrys 0.6 Mg Tabs (Colchicine) .Marland Kitchen.. 1 by mouth two times a day as needed 10)  Allopurinol 300 Mg Tabs (Allopurinol) .... Take 1 tablet by mouth once a day 11)  Ambien 5 Mg Tabs (Zolpidem tartrate) .Marland Kitchen.. 1 by mouth at bedtime as needed 12)  Saw Palmetto Plus Caps (Misc natural products) .... Take 3 tablet by mouth once a day 13)  Xopenex Hfa 45 Mcg/act Aero (Levalbuterol tartrate) .... Two puffs up to four times per day as needed 14)  Diltiazem Hcl 120 Mg Tabs (Diltiazem hcl) .Marland Kitchen.. 1 two times a day 15)  Pravastatin Sodium 40 Mg Tabs (Pravastatin sodium) .Marland Kitchen.. 1 by mouth daily  Other Orders: Est. Patient Level III (16109) Rheumatology Referral (Rheumatology)  Patient Instructions: 1)  Will arrange for rheumatology referral 2)  Follow up with neurology  3)  Follow up with pulmonary in 2 months

## 2010-07-08 NOTE — Progress Notes (Signed)
Summary: returned call  Phone Note Call from Patient Call back at Home Phone [phone]   Caller: Patient Call For: rhonda Summary of Call: returning your phone call. Initial call taken by: Darletta Moll,  July 25, 2009 11:31 AM  Follow-up for Phone Call        pt advised that he has an appt with Dr. Orlin Hilding, ([facility]) for August 28, 2009 at 8:30. Pt aware of appt date, time and location. Records mailed to Dr. Lendon Colonel. Still waiting on Neuro. appt. Alfonso Ramus  July 25, 2009 2:08 PM  Referral and ov notes, labs, mri etc faxed over for Dr. Loyal Gambler with [facility] Neuro. for appt. Alfonso Ramus  July 26, 2009 12:27 PM Dr. Craige Cotta office called and stated that she has agreed to see pt. Missy from [facility] to call and schedule on Monday. Called pt and he is aware that appt will be scheduled first part of the week. Alfonso Ramus  July 26, 2009 5:09 PM Spoke with Missy today in referrence to appt with Dr. Craige Cotta. She is trying to get pt in today or tomorrow with Dr. Craige Cotta for Neuro. Eval. Missy is going to let pt know as well as myself. She has spoke with pt today and pt is aware appt will be today or tomorrow. Alfonso Ramus  July 30, 2009 9:46 AM

## 2010-07-08 NOTE — Assessment & Plan Note (Signed)
Summary: TESTOSTERONE INJ/[email or web address].CD  Nurse Visit   Allergies: 1)  ! Sulfa  Medication Administration  Injection # 1:    Medication: Testosterone Cypionat 200mg  ing    Diagnosis: TESTICULAR HYPOFUNCTION (ICD-257.2)    Route: IM    Site: LUOQ gluteus    Exp Date: 10/07/2011    Lot #: [id number]    Mfr: [employer]ent    Patient tolerated injection without complications    Given by: Margaret Pyle, CMA (February 11, 2010 10:32 AM)  Orders Added: 1)  Admin of patients own med IM/SQ [phone]

## 2010-07-08 NOTE — Assessment & Plan Note (Signed)
Summary: testosterone inj/leschber/cd  Nurse Visit   Allergies: 1)  ! Sulfa  Medication Administration  Injection # 1:    Medication: Testosterone Cypionate to 100 MG    Diagnosis: TESTICULAR HYPOFUNCTION (ICD-257.2)    Route: IM    Site: RUOQ gluteus    Exp Date: 10/07/2011    Lot #: [id number]    Mfr: Abraxis    Patient tolerated injection without complications    Given by: Margaret Pyle, CMA (Oct 09, 2009 3:14 PM)  Orders Added: 1)  Admin of patients own med IM/SQ [phone]

## 2010-07-08 NOTE — Progress Notes (Signed)
Summary: cialis  Phone Note Refill Request Message from:  Fax from Pharmacy on April 02, 2010 4:12 PM  Refills Requested: Medication #1:  Cialis 5mg  take 1 prior to intercourse as needed #6   Last Refilled: 11/08/2009 [facility] [phone] Med is not on med list. Is this ok to refill?  Initial call taken by: Orlan Leavens RMA,  April 02, 2010 4:13 PM  Follow-up for Phone Call        this was prescribed summer 2011 - taken off list b/c pt felt med ineffective- ok to refill same and resume use as needed - thanks Follow-up by: Newt Lukes MD,  April 02, 2010 6:03 PM    New/Updated Medications: CIALIS 5 MG TABS (TADALAFIL) take 1 tablet by mouth prior to intercourse as needed Prescriptions: CIALIS 5 MG TABS (TADALAFIL) take 1 tablet by mouth prior to intercourse as needed  #6 x 0   Entered by:   Orlan Leavens RMA   Authorized by:   Newt Lukes MD   Signed by:   Orlan Leavens RMA on 04/03/2010   Method used:   Electronically to        [facility] [address] (retail)       [address]       [place], [place]  [address]       Ph: [phone]       Fax: [phone]   RxID:   [id number]

## 2010-07-08 NOTE — Assessment & Plan Note (Signed)
Summary: 3 mth fu  stc---TESTOSTERONE INJ ALSO-OYU   Vital Signs:  Patient profile:   40 year old male Height:      75 inches (190.50 cm) Weight:      296.4 pounds (134.73 kg) O2 Sat:      98 % on Room air Temp:     98.4 degrees F (36.89 degrees C) oral Pulse rate:   88 / minute BP sitting:   130 / 88  (left arm) Cuff size:   large  Vitals Entered By: Orlan Leavens RMA (January 07, 2010 10:47 AM)  O2 Flow:  Room air CC: 3 month follow-up Is Patient Diabetic? Yes Did you bring your meter with you today? No Pain Assessment Patient in pain? no        Primary Care Provider:  Newt Lukes MD  CC:  3 month follow-up.  History of Present Illness: here for f/u   1) DM2 - prednisone induced - on metformin -check cbgs at home 2 x/day - 120-170s - no longer feels thirsty - no n/v or abd pain - seeing endo for same ([facility])  2) sarcoidosis - involves lungs, joints and ?brain/liver - maintained on MTX for same, off pred sonce 12/2009- followed by pulm and rheum, also neuor - inc pred dose related to inc symptoms of SOB - ?inc MTX planned if liver and amonia levels normalize per pt -   3) CAD - follows with [facility] cards fro same - hosp and cath 06/2009: nonobst dx, med mgmt - no CP since that time - c/o occ end of the day ankle edema - no change in symptoms in recent months, resolves with elevation and furosemide use  4) HTN - reports compliance with ongoing medical treatment. changes in medication reviewed (prev on atenolol, changed to metoprolol by pulm 12/03/09 for pulm issues). denies adverse side effects related to current therapy.   5) dyslipidemia - reports compliance with ongoing medical treatment and no changes in medication dose or frequency. denies adverse side effects related to current therapy. no GI or muscle c/o  6) ED - tried viagra and cialis in past - reports no interest as primary cause of problems but denies depression -  also low test level - no on gel  replacement   Clinical Review Panels:  Lipid Management   Cholesterol:  173 (06/25/2009)   LDL (bad choesterol):  124 (06/25/2009)   HDL (good cholesterol):  31 (06/25/2009)   Triglycerides:  90 (06/25/2009)  Diabetes Management   HgBA1C:  6.8 (10/07/2009)   Creatinine:  0.7 (09/17/2009)   Last Dilated Eye Exam:  No diaetic retinopathy, no diabetic macular edema, No sarcodosis found in the eyes (10/24/2009)  CBC   WBC:  6.8 (06/17/2009)   RBC:  5.07 (06/17/2009)   Hgb:  13.6 (06/17/2009)   Hct:  41.1 (06/17/2009)   Platelets:  342.0 (06/17/2009)   MCV  81.0 (06/17/2009)   MCHC  33.1 (06/17/2009)   RDW  12.8 (06/17/2009)   PMN:  38.4 (06/17/2009)   Lymphs:  40.2 (06/17/2009)   Monos:  13.8 (06/17/2009)   Eosinophils:  4.5 (06/17/2009)   Basophil:  3.1 (06/17/2009)  Complete Metabolic Panel   Glucose:  153 (09/17/2009)   Sodium:  142 (09/17/2009)   Potassium:  3.4 (09/17/2009)   Chloride:  101 (09/17/2009)   CO2:  31 (09/17/2009)   BUN:  9 (09/17/2009)   Creatinine:  0.7 (09/17/2009)   Albumin:  3.6 (06/17/2009)   Total Protein:  8.5 (06/17/2009)   Calcium:  9.1 (09/17/2009)   Total Bili:  0.7 (06/17/2009)   Alk Phos:  73 (06/17/2009)   SGPT (ALT):  29 (06/17/2009)   SGOT (AST):  21 (06/17/2009)   Current Medications (verified): 1)  Qvar 80 Mcg/act Aers (Beclomethasone Dipropionate) .... 2 Puffs Two Times A Day 2)  Xopenex Hfa 45 Mcg/act Aero (Levalbuterol Tartrate) .... Two Puffs Up To Four Times Per Day As Needed 3)  Methotrexate 2.5 Mg Tabs (Methotrexate Sodium) .... Seven By Mouth Once Per Week 4)  Folic Acid 1 Mg Tabs (Folic Acid) .... One By Mouth Once Daily 5)  Aspirin 325 Mg Tabs (Aspirin) .Marland Kitchen.. 1 By Mouth Once Daily 6)  Metoprolol Tartrate 50 Mg Tabs (Metoprolol Tartrate) .... One Two Times A Day 7)  Diltiazem Hcl Er Beads 240 Mg Xr24h-Cap (Diltiazem Hcl Er Beads) .Marland Kitchen.. 1 By Mouth Once Daily 8)  Hydrochlorothiazide 12.5 Mg Tabs (Hydrochlorothiazide) .Marland Kitchen.. 1  By Mouth Daily 9)  Furosemide 40 Mg Tabs (Furosemide) .Marland Kitchen.. 1 By Mouth Daily 10)  Kaon-Cl-10 10 Meq Cr-Tabs (Potassium Chloride) .... 2 By Mouth Daily 11)  Pravastatin Sodium 40 Mg Tabs (Pravastatin Sodium) .Marland Kitchen.. 1 By Mouth Daily 12)  Colcrys 0.6 Mg Tabs (Colchicine) .Marland Kitchen.. 1 By Mouth Two Times A Day As Needed 13)  Allopurinol 300 Mg Tabs (Allopurinol) .... Take 1 Tablet By Mouth Once A Day 14)  Lactulose 10 Gm/60ml Soln (Lactulose) .... 2 Teaspoons 3 Times A Day 15)  Ambien 5 Mg Tabs (Zolpidem Tartrate) .Marland Kitchen.. 1 By Mouth At Bedtime As Needed 16)  Saw Palmetto Plus  Caps (Misc Natural Products) .... Take 3 Tablet By Mouth Once A Day 17)  Calcium D-Glucarate 500 Mg Caps (Nutritional Supplements) .Marland Kitchen.. 1 Per Day 18)  Depo-Testosterone 200 Mg/ml Oil (Testosterone Cypionate) .... 200mg  Im Every 3-4 Weeks 19)  Metformin Hcl 500 Mg Xr24h-Tab (Metformin Hcl) .... 4 Tabs Each Am 20)  Prodigy Blood Glucose Test  Strp (Glucose Blood) .... Check Blood Sugars Two Times A Day  Dx: 250.00 21)  Prodigy Lancets 26g  Misc (Lancets) .... Use Two Times A Day 22)  Levitra 10 Mg Tabs (Vardenafil Hcl) .... Use As Needed 1 Hour Before Intercourse  Allergies (verified): 1)  ! Sulfa  Past History:  Past Medical History: Gout Hypertension Diabetes mellitus 2 Asthma  Rheumatic fever age 13 Arthritis      - ?juvenile rheumatoid arthritis vs sarcoidosis      - followed by Dr. Nickola Major with Rheumatology TIA Peripheral neuropathy Mediastinal lymphadenopathy with biopsy proven sarcoidosis - Dr. Craige Cotta       - CT chest 07/30/08       -CT chest 09/20/09 Interval development of numerous 2- 3 mm pulmonary nodules typical for Sarcoid,  Slight         interval decrease in the size of mildly enlarged mediastinal lymph nodes.       - MTX started 03/01/09 Possible Neurosarcoidis      - Followed by Dr. 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 Francina Am[facility] at [facility]   Review of Systems  The patient denies anorexia, weight loss, chest pain, and abdominal pain.         c/o DOE and PND, no SOB at rest; also sour taste in back of throat upon waking  but no heartburn or CP.  Physical Exam  General:  overweight-appearing.  alert, well-developed, well-nourished, and cooperative to examination.    Lungs:  normal respiratory effort, no intercostal retractions or use of accessory muscles; normal breath sounds bilaterally - no crackles and no wheezes.    Heart:  normal rate, regular rhythm, no murmur, and no rub. BLE with trace edema.  Psych:  Oriented X3, memory intact for recent and remote, normally interactive, good eye contact, not anxious appearing, not depressed appearing, and not agitated.      Impression & Recommendations:  Problem # 1:  DYSPNEA (ICD-786.05) weight up 5# since last OV 3 weeks ago -  ?diastolic dysfx  (echo 06/25/09 reviewed - normal LVEF) check labs now - r/o thyroid, anemia, chf or renal dz - increase furosemide + Kcl next 5 days other pulm eval as ongoing with pulm -  note off pred approx 2 weeks-- ?contrib to slow inc symptoms  Orders: TLB-BMP (Basic Metabolic Panel-BMET) (80048-METABOL) TLB-CBC Platelet - w/Differential (85025-CBCD) TLB-BNP (B-Natriuretic Peptide) (83880-BNPR) TLB-TSH (Thyroid Stimulating Hormone) (84443-TSH)  Problem # 2:  GERD (ICD-530.81)  sour taste in AM... add daily h2b for suspected reflux symptoms - erx done His updated medication list for this problem includes:    Pepcid 40 Mg Tabs (Famotidine) .Marland Kitchen... 1 by mouth once daily  Orders: Prescription Created Electronically [phone])  Labs Reviewed: Hgb: 13.6 (06/17/2009)   Hct: 41.1 (06/17/2009)  Problem # 3:  DIABETES MELLITUS, TYPE II (ICD-250.00) ongoing mrf mgmt per endo - appt upcoming His updated medication list for this problem  includes:    Aspirin 325 Mg Tabs (Aspirin) .Marland Kitchen... 1 by mouth once daily    Metformin Hcl 500 Mg Xr24h-tab (Metformin hcl) .Marland KitchenMarland KitchenMarland KitchenMarland Kitchen 4 tabs each am  Orders: TLB-A1C / Hgb A1C (Glycohemoglobin) (83036-A1C)  Labs Reviewed: Creat: 0.7 (09/17/2009)     Last Eye Exam: No diaetic retinopathy, no diabetic macular edema, No sarcodosis found in the eyes (10/24/2009) Reviewed HgBA1c results: 6.8 (10/07/2009)  6.5 (06/24/2009)  Problem # 4:  HYPERTENSION (ICD-401.9)  His updated medication list for this problem includes:    Metoprolol Tartrate 50 Mg Tabs (Metoprolol tartrate) ..... One two times a day    Diltiazem Hcl Er Beads 240 Mg Xr24h-cap (Diltiazem hcl er beads) .Marland Kitchen... 1 by mouth once daily    Hydrochlorothiazide 12.5 Mg Tabs (Hydrochlorothiazide) .Marland Kitchen... 1 by mouth daily    Furosemide 40 Mg Tabs (Furosemide) .Marland Kitchen... 1 by mouth two times a day (or as directed)  Orders: TLB-BMP (Basic Metabolic Panel-BMET) (80048-METABOL) TLB-BNP (B-Natriuretic Peptide) (83880-BNPR)  BP today: 130/88 Prior BP: 130/82 (12/23/2009)  Labs Reviewed: K+: 3.4 (09/17/2009) Creat: : 0.7 (09/17/2009)   Chol: 173 (06/25/2009)   HDL: 31 (06/25/2009)   LDL: 124 (06/25/2009)   TG: 90 (06/25/2009)  Complete Medication List: 1)  Xopenex Hfa 45 Mcg/act Aero (Levalbuterol tartrate) .... Two puffs up to four times per day as needed 2)  Methotrexate 2.5 Mg Tabs (Methotrexate sodium) .... Nine by mouth once per week 3)  Folic Acid 1 Mg Tabs (Folic acid) .... One by mouth once daily 4)  Aspirin 325 Mg Tabs (Aspirin) .Marland Kitchen.. 1 by mouth once daily 5)  Metoprolol Tartrate 50 Mg Tabs (Metoprolol tartrate) .... One two times a day 6)  Diltiazem Hcl Er Beads 240 Mg Xr24h-cap (Diltiazem hcl er beads) .Marland Kitchen.. 1 by mouth once daily 7)  Hydrochlorothiazide 12.5 Mg Tabs (Hydrochlorothiazide) .Marland Kitchen.. 1 by mouth daily 8)  Furosemide 40 Mg Tabs (Furosemide) .Marland Kitchen.. 1 by  mouth two times a day (or as directed) 9)  Klor-con M20 20 Meq Cr-tabs (Potassium  chloride crys cr) .Marland Kitchen.. 1 by mouth two times a day (or as directed) 10)  Pravastatin Sodium 40 Mg Tabs (Pravastatin sodium) .Marland Kitchen.. 1 by mouth daily 11)  Colcrys 0.6 Mg Tabs (Colchicine) .Marland Kitchen.. 1 by mouth two times a day as needed 12)  Allopurinol 300 Mg Tabs (Allopurinol) .... Take 1 tablet by mouth once a day 13)  Lactulose 10 Gm/44ml Soln (Lactulose) .... 2 teaspoons 3 times a day 14)  Ambien 5 Mg Tabs (Zolpidem tartrate) .Marland Kitchen.. 1 by mouth at bedtime as needed 15)  Saw Palmetto Plus Caps (Misc natural products) .... Take 3 tablet by mouth once a day 16)  Calcium D-glucarate 500 Mg Caps (Nutritional supplements) .Marland Kitchen.. 1 per day 17)  Depo-testosterone 200 Mg/ml Oil (Testosterone cypionate) .... 200mg  im every 3-4 weeks 18)  Metformin Hcl 500 Mg Xr24h-tab (Metformin hcl) .... 4 tabs each am 19)  Prodigy Blood Glucose Test Strp (Glucose blood) .... Check blood sugars two times a day  dx: 250.00 20)  Prodigy Lancets 26g Misc (Lancets) .... Use two times a day 21)  Levitra 10 Mg Tabs (Vardenafil hcl) .... Use as needed 1 hour before intercourse 22)  Pepcid 40 Mg Tabs (Famotidine) .Marland Kitchen.. 1 by mouth once daily  Other Orders: Admin of patients own med IM/SQ [id number])  Patient Instructions: 1)  it was good to see you today. 2)  start Pepcid for the sour taste and probable reflux symptoms  3)  increase Lasic to two times a day x 5 days and then resume once daily - also increase [potassium x 5 days as discussed - 4)  your prescriptions have been electronically submitted to your pharmacy. Please take as directed. Contact our office if you believe you're having problems with the medication(s).  5)  test(s) ordered today - your results will be posted on the phone tree for review in 48-72 hours from the time of test completion; call [phone] and enter your 9 digit MRN (listed above on this page, just below your name); if any changes need to be made or there are abnormal results, you will be contacted directly.   6)  Please schedule follow-up appointment in 3-4 months, call sooner if problems.  Prescriptions: PEPCID 40 MG TABS (FAMOTIDINE) 1 by mouth once daily  #30 x 6   Entered and Authorized by:   Newt Lukes MD   Signed by:   Newt Lukes MD on 01/07/2010   Method used:   Electronically to        [facility]ail[facility]       [address]       [place], [place]  [address]       Ph: [phone]       Fax: [phone]   RxID:   [id number] KLOR-CON M20 20 MEQ CR-TABS (POTASSIUM CHLORIDE CRYS CR) 1 by mouth two times a day (or as directed)  #60 x 5   Entered and Authorized by:   Newt Lukes MD   Signed by:   Newt Lukes MD on 01/07/2010   Method used:   Electronically to        [facility])       [address]       [place], [place]  [address]  Ph: [phone]       Fax: [phone]   RxID:   [id number] FUROSEMIDE 40 MG TABS (FUROSEMIDE) 1 by mouth two times a day (or as directed)  #60 x 6   Entered and Authorized by:   Newt Lukes MD   Signed by:   Newt Lukes MD on 01/07/2010   Method used:   Electronically to        [facility]       [address]       [place], [place]  [address]       Ph: [phone]       Fax: [phone]   RxID:   [id number]    Medication Administration  Injection # 1:    Medication: Testosterone Cypionat 200mg  ing    Diagnosis: TESTICULAR HYPOFUNCTION (ICD-257.2)    Route: IM    Site: RUOQ gluteus    Exp Date: 10/2011    Lot #: [id number]    Patient tolerated injection without complications    Given by: Orlan Leavens RMA (January 07, 2010 11:18 AM)  Orders Added: 1)  Est. Patient Level IV [76720] 2)  TLB-BMP (Basic Metabolic Panel-BMET) [80048-METABOL] 3)  TLB-CBC Platelet - w/Differential [85025-CBCD] 4)  TLB-BNP (B-Natriuretic Peptide) [83880-BNPR] 5)  TLB-TSH  (Thyroid Stimulating Hormone) [84443-TSH] 6)  TLB-A1C / Hgb A1C (Glycohemoglobin) [83036-A1C] 7)  Prescription Created Electronically [[id number]] 8)  Admin of patients own med IM/SQ [[id number]]

## 2010-07-08 NOTE — Assessment & Plan Note (Signed)
Summary: Pulmonary/ ext ov  multiple co's  ? sarcoid related    Copy to:  Dr. Nickola Major, Dr. Craige Cotta, Dr. Royann Shivers Primary Provider/Referring Provider:  Newt Lukes MD  CC:  Acute visit.  Pt c/o tightness in chest x 3 days.  He also c/o dizziness and nausea.  He was seen on 8/23 and had back pain- states this has worsened since then.  Pt also c/o increased sweating- denies any fever.  He gets SOB with exertion- such as from lobby to exam room today.Marland Kitchen  History of Present Illness: 39 yobm quit smoking Jan 2011  with sarcoidosis >  pulmonary and neuro involvement.  January 31, 2010 Acute visit.  Pt c/o tightness in chest x 3 days.  He also c/o dizziness and nausea.  He was seen on 8/23 and had back pain- states this has worsened since then.  Pt also c/o increased sweating- denies any fever.  He gets SOB with exertion- such as from lobby to exam room today. Symptoms progressively worse and attributed to mtx but not due for a dose until the day of ov and has not taken yet much worse last 3 days with multiple complaints,nausea and dry cough/ sob being the worst.  Pt denies any significant sore throat, dysphagia, itching, sneezing,  nasal congestion or excess secretions,  fever, chills, sweats, unintended wt loss, pleuritic or exertional cp, hempoptysis,  orthopnea pnd or leg swelling.    Current Medications (verified): 1)  Xopenex Hfa 45 Mcg/act Aero (Levalbuterol Tartrate) .... Two Puffs Up To Four Times Per Day As Needed 2)  Methotrexate Sol .Marland Kitchen.. 8cc Injection Weekly 3)  Folic Acid 1 Mg Tabs (Folic Acid) .... One By Mouth Once Daily 4)  Aspirin 325 Mg Tabs (Aspirin) .Marland Kitchen.. 1 By Mouth Once Daily 5)  Metoprolol Tartrate 50 Mg Tabs (Metoprolol Tartrate) .... One Two Times A Day 6)  Hydrochlorothiazide 12.5 Mg Tabs (Hydrochlorothiazide) .Marland Kitchen.. 1 By Mouth Daily 7)  Furosemide 40 Mg Tabs (Furosemide) .Marland Kitchen.. 1 By Mouth Two Times A Day As Needed 8)  Klor-Con M20 20 Meq Cr-Tabs (Potassium Chloride Crys Cr)  .Marland Kitchen.. 1 By Mouth Two Times A Day (Or As Directed) 9)  Pravastatin Sodium 40 Mg Tabs (Pravastatin Sodium) .Marland Kitchen.. 1 By Mouth Daily 10)  Colcrys 0.6 Mg Tabs (Colchicine) .Marland Kitchen.. 1 By Mouth Two Times A Day As Needed 11)  Allopurinol 300 Mg Tabs (Allopurinol) .... Take 1 Tablet By Mouth Once A Day 12)  Ambien 5 Mg Tabs (Zolpidem Tartrate) .Marland Kitchen.. 1 By Mouth At Bedtime As Needed 13)  Saw Palmetto Plus  Caps (Misc Natural Products) .... Take 3 Tablet By Mouth Once A Day 14)  Calcium D-Glucarate 500 Mg Caps (Nutritional Supplements) .Marland Kitchen.. 1 Per Day 15)  Depo-Testosterone 200 Mg/ml Oil (Testosterone Cypionate) .... 200mg  Im Every 3-4 Weeks 16)  Metformin Hcl 500 Mg Xr24h-Tab (Metformin Hcl) .... 4 Tabs Each Am 17)  Prodigy Blood Glucose Test  Strp (Glucose Blood) .... Check Blood Sugars Two Times A Day  Dx: 250.00 18)  Prodigy Lancets 26g  Misc (Lancets) .... Use Two Times A Day 19)  Levitra 10 Mg Tabs (Vardenafil Hcl) .... Use As Needed 1 Hour Before Intercourse 20)  Pepcid 40 Mg Tabs (Famotidine) .Marland Kitchen.. 1 By Mouth Once Daily 21)  Diltiazem Hcl 60 Mg Tabs (Diltiazem Hcl) .... Take 1 Four Times A Day  Allergies (verified): 1)  ! Sulfa  Past History:  Past Medical History: Gout Hypertension Diabetes mellitus 2 Asthma  Rheumatic fever age  19 Arthritis      - ?juvenile rheumatoid arthritis vs sarcoidosis      - followed by Dr. Nickola Major with Rheumatology TIA Peripheral neuropathy Mediastinal lymphadenopathy with biopsy proven sarcoidosis...............Marland KitchenDr. Craige Cotta       - CT chest 07/30/08       -CT chest 09/20/09 Interval development of numerous 2- 3 mm pulmonary nodules typical for Sarcoid,  Slight         interval decrease in the size of mildly enlarged mediastinal lymph nodes.       - MTX started 03/01/09 Possible Neurosarcoidis      - Followed by Dr. Craige Cotta with [facility] Narcolepsy without Cataplexy      - MSLT 01/09/09       - MRI brain 01/29/09 ?neuro-sarcoidosis CAD - cath 06/2009  -nonobst dz     -follows with Dr. Francina Ames at [facility] hyperlipidemia   Vital Signs:  Patient profile:   40 year old male Weight:      292.25 pounds O2 Sat:      99 % on Room air Temp:     98.1 degrees F oral Pulse rate:   67 / minute BP sitting:   128 / 80  (left arm)  Vitals Entered By: Vernie Murders (January 31, 2010 11:03 AM)  O2 Flow:  Room air  Physical Exam  Additional Exam:  Amb pleasant slt cushingnoid bm nad. wt 194 > 192 January 31, 2010  HEENT:  [place]/AT, , EACs-clear, TMs-wnl, NOSE-clear discharge  THROAT-clear NECK:  Supple w/ fair ROM; no JVD; normal carotid impulses w/o bruits; no thyromegaly or nodules palpated; no lymphadenopathy. RESP  Clear to P & A; w/o, wheezes/ rales/ or rhonchi. CARD:  RRR, no m/r/g   GI:   Soft & nt; nml bowel sounds; no organomegaly or masses detected. Musco: Warm bil,  no calf tenderness edema, clubbing, pulses intact Neuro: intact w/ no focal deficits noted  Skin: intact w/ no rash.     Sodium                    142 mEq/L                   135-145   Potassium                 3.6 mEq/L                   3.5-5.1   Chloride                  101 mEq/L                   96-112   Carbon Dioxide            30 mEq/L                    19-32   Glucose              [H]  131 mg/dL                   11-91   BUN                       11 mg/dL                    4-78   Creatinine  0.8 mg/dL                   4.7-8.2   Calcium                   9.5 mg/dL                   9.5-62.1   GFR                       146.08 mL/min               >60  Tests: (2) CBC Platelet w/Diff (CBCD)   White Cell Count          8.0 K/uL                    4.5-10.5   Red Cell Count            5.19 Mil/uL                 4.22-5.81   Hemoglobin                14.4 g/dL                   30.8-65.7   Hematocrit                42.0 %                      39.0-52.0   MCV                       81.1 fl                     78.0-100.0   MCHC                       34.3 g/dL                   84.6-96.2   RDW                  [H]  15.7 %                      11.5-14.6   Platelet Count            332.0 K/uL                  150.0-400.0   Neutrophil %              57.8 %                      43.0-77.0   Lymphocyte %              35.0 %                      12.0-46.0   Monocyte %                3.7 %                       3.0-12.0   Eosinophils%              2.8 %  0.0-5.0   Basophils %               0.7 %                       0.0-3.0   Neutrophill Absolute      4.6 K/uL                    1.4-7.7   Lymphocyte Absolute       2.8 K/uL                    0.7-4.0   Monocyte Absolute         0.3 K/uL                    0.1-1.0  Eosinophils, Absolute                             0.2 K/uL                    0.0-0.7   Basophils Absolute        0.1 K/uL                    0.0-0.1  Tests: (3) Hepatic/Liver Function Panel (HEPATIC)   Total Bilirubin           0.8 mg/dL                   4.7-8.2   Direct Bilirubin          0.2 mg/dL                   9.5-6.2   Alkaline Phosphatase      82 U/L                      39-117   AST                       27 U/L                      0-37   ALT                       33 U/L                      0-53   Total Protein             7.5 g/dL                    1.3-0.8   Albumin                   4.0 g/dL                    6.5-7.8  Tests: (4) TSH (TSH)   FastTSH                   0.44 uIU/mL                 0.35-5.50  Tests: (5) Sed Rate (ESR)   Sed Rate             [H]  35 mm/hr  0-22  Tests: (6) B-Type Natiuretic Peptide (BNPR)  B-Type Natriuetic Peptide                             26.8 pg/mL                  0.0-100.0  Tests: (7) UDip Only (UDIP)   Color                     LT. YELLOW       RANGE:  Yellow;Lt. Yellow   Clarity                   CLEAR                       Clear   Specific Gravity          >=1.030                     1.000 - 1.030   Urine Ph                   5.5                         5.0-8.0   Protein                   NEGATIVE                    Negative   Urine Glucose             NEGATIVE                    Negative   Ketones                   NEGATIVE                    Negative   Urine Bilirubin           NEGATIVE                    Negative   Blood                     NEGATIVE                    Negative   Urobilinogen              0.2                         0.0 - 1.0   Leukocyte Esterace        NEGATIVE                    Negative   Nitrite                   NEGATIVE                    Negative    CXR  Procedure date:  01/31/2010  Findings:        1.  Stable radiograph of the chest. 2.  Low lung volumes as before.  Impression & Recommendations:  Problem # 1:  PULMONARY SARCOIDOSIS (ICD-135) The goal with a chronic steroid dependent  illness is always arriving at the lowest effective dose that controls the disease/symptoms and not accepting a set "formula" which is based on statistics that don't take into accound individual variability or the natural hx of the dz in every individual patient, which may well vary over time.   In his case I don't think 0 is the right dose, but it's not really clear because so many of his symptoms are non-specific and could be from many sources, including his 20 or so medications.  Rec start pred back at 20 mg per day until better then one daily  Problem # 2:  GERD (ICD-530.81)  His updated medication list for this problem includes:    Pepcid 40 Mg Tabs (Famotidine) ..... One at bedtime    Aciphex 20 Mg Tbec (Rabeprazole sodium) .Marland Kitchen... Take  one 30-60 min before first meal of the day  empiric rx plus diet rec since so many of his symptoms are upper airway and could be gerd mediated   Orders: Est. Patient Level IV (62130)  Problem # 3:  HYPERTENSION (ICD-401.9)  His updated medication list for this problem includes:    Metoprolol Tartrate 50 Mg Tabs (Metoprolol tartrate) ..... Substitute  bystolic 10mg  one daily    Hydrochlorothiazide 12.5 Mg Tabs (Hydrochlorothiazide) .Marland Kitchen... 1 by mouth daily    Furosemide 40 Mg Tabs (Furosemide) .Marland Kitchen... 1 by mouth two times a day as needed    Diltiazem Hcl 60 Mg Tabs (Diltiazem hcl) .Marland Kitchen... Take 1 four times a day  Switch metaprolol to Bystolic, the most beta -1  selective Beta blocker available in sample form, with bisoprolol the most selective generic choice  on the market, at least in short term until symptom control achieved  Orders: Est. Patient Level IV (86578)  Medications Added to Medication List This Visit: 1)  Methotrexate Sol  .Marland Kitchen.. 8cc injection weekly not yet started 2)  Metoprolol Tartrate 50 Mg Tabs (Metoprolol tartrate) .... Substitute bystolic 10mg  one daily 3)  Furosemide 40 Mg Tabs (Furosemide) .Marland Kitchen.. 1 by mouth two times a day as needed 4)  Pepcid 40 Mg Tabs (Famotidine) .... One at bedtime 5)  Prednisone 10 Mg Tabs (Prednisone) .... 2 each am with breakfast until better then one daily until seen 6)  Aciphex 20 Mg Tbec (Rabeprazole sodium) .... Take  one 30-60 min before first meal of the day  Other Orders: T-Angiotensin i-Converting Enzyme ([id number]) T- * Misc. Laboratory test [phone]) T-2 View CXR (71020TC) TLB-BMP (Basic Metabolic Panel-BMET) (80048-METABOL) TLB-CBC Platelet - w/Differential (85025-CBCD) TLB-Hepatic/Liver Function Pnl (80076-HEPATIC) TLB-TSH (Thyroid Stimulating Hormone) (84443-TSH) TLB-Sedimentation Rate (ESR) (85652-ESR) TLB-BNP (B-Natriuretic Peptide) (83880-BNPR) TLB-Udip ONLY (81003-UDIP)  Patient Instructions: 1)  Restart prednisone 10 mg 2 daily with bfast unitl better then one daily 2)  Bystolic 10 mg one daily instead of metaprolol 3)  Aciphex 20 mg Take  one 30-60 min before first meal of the day  4)  Famotidine (Pepcid) 40 mg one at bedtime 5)  GERD (REFLUX)  is a common cause of respiratory symptoms. It commonly presents without heartburn and can be treated with medication, but also  with lifestyle changes including avoidance of late meals, excessive alcohol, smoking cessation, and avoid fatty foods, chocolate, peppermint, colas, red wine, and acidic juices such as orange juice. NO MINT OR MENTHOL PRODUCTS SO NO COUGH DROPS  6)  USE SUGARLESS CANDY INSTEAD (jolley ranchers)  7)  NO OIL BASED VITAMINS

## 2010-07-08 NOTE — Assessment & Plan Note (Signed)
Summary: 4 weeks/apc   Visit Type:  Follow-up Copy to:  Dr. Nickola Major, Dr. Craige Cotta, Dr. Royann Shivers Primary Provider/Referring Provider:  Newt Lukes MD  CC:  Sarcoid.  The patient c/o increased SOB with exertion and even worse when lying down.  His cough is dry during the day but prod when lying down and says he has started sleeping in his recliner.[name]  History of Present Illness: 40 yo male with sarcoidosis with pulmonary and neuro involvement.  His breathing has been getting worse.  He has a dry cough.  He has not had fever, but still gets sweats.  He is not having chest pain.  He gets congested in the chest if he lays flat.  He has been getting more swelling in his ankles.  He has been on MTX 7 pills for the past month.  His mouth sores have improved since he had his folic acid increased.     CXR  Procedure date:  11/19/2009  Findings:      CHEST - 2 VIEW   Comparison: 10/18/2009 and CT 09/2009   Findings: Low lung volumes are present and taking this into consideration heart size is within normal limits. A stable mediastinal configuration is seen.  The lung fields demonstrate some crowding of bronchovascular markings at both lung bases.  The lung fields are otherwise clear with no signs of focal infiltrate or congestive failure.  Previously noted lymphadenopathy and pulmonary nodularity demonstrated on chest CT are not well appreciated radiographically.   IMPRESSION: Stable cardiopulmonary appearance with unchanged low lung volumes and no new or acute abnormality suggested radiographically   Current Medications (verified): 1)  Qvar 80 Mcg/act Aers (Beclomethasone Dipropionate) .... 2 Puffs Two Times A Day 2)  Xopenex Hfa 45 Mcg/act Aero (Levalbuterol Tartrate) .... Two Puffs Up To Four Times Per Day As Needed 3)  Prednisone 10 Mg Tabs (Prednisone) .... 2 Tabs By Mouth Once Daily As Directed By Dr .Lendon Colonel 4)  Methotrexate 2.5 Mg Tabs (Methotrexate Sodium) .... Seven By  Mouth Once Per Week 5)  Folic Acid 1 Mg Tabs (Folic Acid) .... One By Mouth Once Daily 6)  Aspirin 325 Mg Tabs (Aspirin) .[name].. 1 By Mouth Once Daily 7)  Atenolol 100 Mg Tabs (Atenolol) .[name].. 1 By Mouth Daily 8)  Diltiazem Hcl Er Beads 240 Mg Xr24h-Cap (Diltiazem Hcl Er Beads) .[name].. 1 By Mouth Once Daily 9)  Hydrochlorothiazide 12.5 Mg Tabs (Hydrochlorothiazide) .[name].. 1 By Mouth Daily 10)  Furosemide 40 Mg Tabs (Furosemide) .[name].. 1 By Mouth Daily 11)  Kaon-Cl-10 10 Meq Cr-Tabs (Potassium Chloride) .... 2 By Mouth Daily 12)  Pravastatin Sodium 40 Mg Tabs (Pravastatin Sodium) .[name].. 1 By Mouth Daily 13)  Colcrys 0.6 Mg Tabs (Colchicine) .[name].. 1 By Mouth Two Times A Day As Needed 14)  Allopurinol 300 Mg Tabs (Allopurinol) .... Take 1 Tablet By Mouth Once A Day 15)  Lactulose 10 Gm/70ml Soln (Lactulose) .... 2 Teaspoons 3 Times A Day 16)  Ambien 5 Mg Tabs (Zolpidem Tartrate) .[name].. 1 By Mouth At Bedtime As Needed 17)  Saw Palmetto Plus  Caps (Misc Natural Products) .... Take 3 Tablet By Mouth Once A Day 18)  Calcium D-Glucarate 500 Mg Caps (Nutritional Supplements) .[name].. 1 Per Day 19)  Cialis 5 Mg Tabs (Tadalafil) .[name].. 1 By Mouth Prior To Intercourse As Needed 20)  Depo-Testosterone 200 Mg/ml Oil (Testosterone Cypionate) .... 200mg  Im Every 3-4 Weeks 21)  Metformin Hcl 500 Mg Xr24h-Tab (Metformin Hcl) .... 4 Tabs Each Am 22)  Glucophage 500 Mg Tabs (Metformin Hcl) .... 4 By Mouth Daily 23)  Prodigy Blood Glucose Monitor  Devi (Blood Glucose Monitoring Suppl) .... Use As Directed 24)  Prodigy Blood Glucose Test  Strp (Glucose Blood) .... Check Blood Sugars Two Times A Day  Dx: 250.00 25)  Prodigy Lancets 26g  Misc (Lancets) .... Use Two Times A Day  Allergies (verified): 1)  ! Sulfa  Past History:  Past Medical History: Gout Hypertension Diabetes mellitus 2 Asthma Rheumatic fever age 54 Arthritis      - ?juvenile rheumatoid arthritis vs sarcoidosis      - followed by Dr. Nickola Major with  Rheumatology TIA Peripheral neuropathy Mediastinal lymphadenopathy with biopsy proven sarcoidosis - Dr. Craige Cotta       - CT chest 07/30/08       -CT chest 09/20/09 Interval development of numerous 2- 3 mm pulmonary nodules typical for Sarcoid,  Slight         interval decrease in the size of mildly enlarged mediastinal lymph nodes.       - MTX started 03/01/09 Possible Neurosarcoidis      - Followed by Dr. Craige Cotta with [facility] Narcolepsy without Cataplexy      - MSLT 01/09/09       - MRI brain 01/29/09 ?neuro-sarcoidosis CAD - cath 06/2009 -nonobst dz     -follows with Dr. Royann Shivers at [facility] hyperlipidemia  Past Surgical History: Reviewed history from 12/13/2008 and no changes required. Bronchoscopy 08/21/08 Mediastinoscopy 11/30/08  Vital Signs:  Patient profile:   40 year old male Height:      75 inches (190.50 cm) Weight:      295 pounds (134.09 kg) BMI:     37.01 O2 Sat:      95 % on room air Temp:     98.2 degrees F (36.78 degrees C) oral Pulse rate:   84 / minute BP sitting:   112 / 74  (right arm) Cuff size:   large  Vitals Entered By: Michel Bickers CMA (November 19, 2009 2:03 PM)  O2 Sat at Rest %:  95 O2 Flow:  room air CC: Sarcoid.  The patient c/o increased SOB with exertion and even worse when lying down.  His cough is dry during the day but prod when lying down and says he has started sleeping in his recliner.   Serial Vital Signs/Assessments:  Comments: 2:21 PM Ambulatory Pulse Oximetry  Resting; HR__86___    02 Sat___99%ra__  Lap1 (185 feet)   HR___101__   02 Sat___97%ra__ Lap2 (185 feet)   HR__100___   02 Sat__99%ra___    Lap3 (185 feet)   HR__95___   02 Sat__95%ra___  _X__Test Completed without Difficulty ___Test Stopped due to: Arman Filter LPN  November 19, 2009 2:22 PM    By: Arman Filter LPN    Physical Exam  General:  obese.   Nose:  no deformity, discharge, inflammation, or lesions Mouth:  no exudate Neck:  no JVD.    Lungs:  diminished breath sounds, no wheeze or rales Heart:  regular rate and rhythm, S1, S2 without murmurs, rubs, gallops, or clicks Abdomen:  bowel sounds positive; abdomen soft and non-tender without masses, or organomegaly Pulses:  pulses normal Extremities:  no clubbing, cyanosis, edema, or deformity noted Cervical Nodes:  no significant adenopathy   Impression & Recommendations:  Problem # 1:  PULMONARY SARCOIDOSIS (ICD-135) He has persistent symptoms of dyspnea.  His chest xray looks unchanged.  He does not have physical findings to  suggest heart failure, and had recent cardiac evaluation which was unremarkable.  I will increase his prednisone to 40 mg once daily for two weeks, and re-assess.  He is to continue with Qvar.  He is to continue on methotrexate per rheumatology.  Medications Added to Medication List This Visit: 1)  Methotrexate 2.5 Mg Tabs (Methotrexate sodium) .... Seven by mouth once per week  Complete Medication List: 1)  Qvar 80 Mcg/act Aers (Beclomethasone dipropionate) .... 2 puffs two times a day 2)  Xopenex Hfa 45 Mcg/act Aero (Levalbuterol tartrate) .... Two puffs up to four times per day as needed 3)  Prednisone 10 Mg Tabs (Prednisone) .... 2 tabs by mouth once daily as directed by dr .Lendon Colonel 4)  Methotrexate 2.5 Mg Tabs (Methotrexate sodium) .... Seven by mouth once per week 5)  Folic Acid 1 Mg Tabs (Folic acid) .... One by mouth once daily 6)  Aspirin 325 Mg Tabs (Aspirin) .[name].. 1 by mouth once daily 7)  Atenolol 100 Mg Tabs (Atenolol) .[name].. 1 by mouth daily 8)  Diltiazem Hcl Er Beads 240 Mg Xr24h-cap (Diltiazem hcl er beads) .[name].. 1 by mouth once daily 9)  Hydrochlorothiazide 12.5 Mg Tabs (Hydrochlorothiazide) .[name].. 1 by mouth daily 10)  Furosemide 40 Mg Tabs (Furosemide) .[name].. 1 by mouth daily 11)  Kaon-cl-10 10 Meq Cr-tabs (Potassium chloride) .... 2 by mouth daily 12)  Pravastatin Sodium 40 Mg Tabs (Pravastatin sodium) .[name].. 1 by mouth daily 13)  Colcrys  0.6 Mg Tabs (Colchicine) .[name].. 1 by mouth two times a day as needed 14)  Allopurinol 300 Mg Tabs (Allopurinol) .... Take 1 tablet by mouth once a day 15)  Lactulose 10 Gm/4ml Soln (Lactulose) .... 2 teaspoons 3 times a day 16)  Ambien 5 Mg Tabs (Zolpidem tartrate) .[name].. 1 by mouth at bedtime as needed 17)  Saw Palmetto Plus Caps (Misc natural products) .... Take 3 tablet by mouth once a day 18)  Calcium D-glucarate 500 Mg Caps (Nutritional supplements) .[name].. 1 per day 19)  Cialis 5 Mg Tabs (Tadalafil) .[name].. 1 by mouth prior to intercourse as needed 20)  Depo-testosterone 200 Mg/ml Oil (Testosterone cypionate) .... 200mg  im every 3-4 weeks 21)  Metformin Hcl 500 Mg Xr24h-tab (Metformin hcl) .... 4 tabs each am 22)  Glucophage 500 Mg Tabs (Metformin hcl) .... 4 by mouth daily 23)  Prodigy Blood Glucose Monitor Devi (Blood glucose monitoring suppl) .... Use as directed 24)  Prodigy Blood Glucose Test Strp (Glucose blood) .... Check blood sugars two times a day  dx: 250.00 25)  Prodigy Lancets 26g Misc (Lancets) .... Use two times a day  Other Orders: T-2 View CXR (71020TC) Est. Patient Level IV (16109)  Patient Instructions: 1)  Prednisone 40 mg once daily until next visit 2)  Follow up in 2 weeks

## 2010-07-08 NOTE — Progress Notes (Signed)
Summary: Trouble sleeping  Phone Note Call from Patient   Caller: Patient Call For: [facility] Summary of Call: Pt says he is having trouble sleeping due to the Prednisone and wants to know if there is anything he can do to help. He is taking the Prednisone first thing every morning. Please advise. Initial call taken by: Michel Bickers CMA,  September 04, 2008 9:12 AM  Follow-up for Phone Call        can you call in a script for ambien 5 mg by mouth at bedtime as needed, dispense 30 with no refills. Follow-up by: [name] MD,  September 04, 2008 5:04 PM  Additional Follow-up for Phone Call Additional follow up Details #1::        Rx has been called to pharmacy-pt is aware. Additional Follow-up by: Vernie Murders,  September 05, 2008 9:01 AM    New/Updated Medications: AMBIEN 5 MG TABS (ZOLPIDEM TARTRATE) 1 by mouth at bedtime as needed   Prescriptions: AMBIEN 5 MG TABS (ZOLPIDEM TARTRATE) 1 by mouth at bedtime as needed  #30 x 0   Entered by:   Vernie Murders   Authorized by:   [name] MD   Signed by:   Vernie Murders on 09/05/2008   Method used:   Telephoned to ...       [facility] ([facility]ail[facility]       [address]       [place], [place]  [address]       Ph: [phone]       Fax: [phone]   RxID:   [id number]

## 2010-07-08 NOTE — Assessment & Plan Note (Signed)
Summary: 3-4 MO ROV /NWS  #   Vital Signs:  Patient profile:   40 year old male Height:      75 inches (190.50 cm) Weight:      291.4 pounds (132.45 kg) O2 Sat:      97 % on Room air Temp:     98.4 degrees F (36.89 degrees C) oral Pulse rate:   85 / minute BP sitting:   112 / 86  (left arm) Cuff size:   large  Vitals Entered By: Orlan Leavens RMA (April 10, 2010 10:30 AM)  O2 Flow:  Room air CC: 3 Month folllw-up Is Patient Diabetic? Yes Did you bring your meter with you today? No Pain Assessment Patient in pain? no      Comments Also want testosterone injection   Primary Care Provider:  Newt Lukes MD  CC:  3 Month folllw-up.  History of Present Illness: here for f/u  1) DM2 - prednisone induced - on metformin -check cbgs at home 2 x/day - better off pred - home cbgs 120s - no longer feels thirsty - no n/v or abd pain - seeing endo for same ([facility])  2) sarcoidosis - involves lungs, joints and ?brain/liver - maintained on MTX for same, off pred sonce 12/2009- followed by pulm and rheum ([facility]), also neuro - holding MTX and pred since 01/2010 - inc arthritis symptoms   3) CAD - follows with [facility] fro same - hosp and cath 06/2009: nonobst dx, med mgmt - no CP since that time - c/o occ end of the day ankle edema - no change in symptoms in recent months, resolves with elevation and furosemide use - planning stress test 04/24/10 due to cont sob  4) HTN - reports compliance with ongoing medical treatment. changes in medication reviewed (prev on atenolol, changed to metoprolol by pulm 12/03/09 for pulm issues). denies adverse side effects related to current therapy.   5) dyslipidemia - reports compliance with ongoing medical treatment and no changes in medication dose or frequency. denies adverse side effects related to current therapy. no GI or muscle c/o  6) ED - tried viagra. levitra and cialis in past - prefers cialis at this time - reports no interest as primary  cause of problems but denies depression -  also low test level - now on IM replacement  7) low testosterone - see ED above    Clinical Review Panels:  Lipid Management   Cholesterol:  173 (06/25/2009)   LDL (bad choesterol):  124 (06/25/2009)   HDL (good cholesterol):  31 (06/25/2009)   Triglycerides:  90 (06/25/2009)  Diabetes Management   HgBA1C:  7.1 (01/07/2010)   Creatinine:  0.8 (01/31/2010)   Last Dilated Eye Exam:  No diaetic retinopathy, no diabetic macular edema, No sarcodosis found in the eyes (10/24/2009)   Last Foot Exam:  yes (04/10/2010)   Last Flu Vaccine:  Fluvax 3+ (03/13/2010)  CBC   WBC:  8.0 (01/31/2010)   RBC:  5.19 (01/31/2010)   Hgb:  14.4 (01/31/2010)   Hct:  42.0 (01/31/2010)   Platelets:  332.0 (01/31/2010)   MCV  81.1 (01/31/2010)   MCHC  34.3 (01/31/2010)   RDW  15.7 (01/31/2010)   PMN:  57.8 (01/31/2010)   Lymphs:  35.0 (01/31/2010)   Monos:  3.7 (01/31/2010)   Eosinophils:  2.8 (01/31/2010)   Basophil:  0.7 (01/31/2010)  Complete Metabolic Panel   Glucose:  131 (01/31/2010)   Sodium:  142 (01/31/2010)  Potassium:  3.6 (01/31/2010)   Chloride:  101 (01/31/2010)   CO2:  30 (01/31/2010)   BUN:  11 (01/31/2010)   Creatinine:  0.8 (01/31/2010)   Albumin:  4.0 (01/31/2010)   Total Protein:  7.5 (01/31/2010)   Calcium:  9.5 (01/31/2010)   Total Bili:  0.8 (01/31/2010)   Alk Phos:  82 (01/31/2010)   SGPT (ALT):  33 (01/31/2010)   SGOT (AST):  27 (01/31/2010)   Current Medications (verified): 1)  Xopenex Hfa 45 Mcg/act Aero (Levalbuterol Tartrate) .... Two Puffs Up To Four Times Per Day As Needed 2)  Methotrexate Sol .Marland Kitchen.. 8cc Injection Weekly Not Yet Started Has Not Started 3)  Folic Acid 1 Mg Tabs (Folic Acid) .... One By Mouth Once Daily 4)  Aspirin 325 Mg Tabs (Aspirin) .Marland Kitchen.. 1 By Mouth Once Daily 5)  Metoprolol Tartrate 50 Mg Tabs (Metoprolol Tartrate) .Marland Kitchen.. 1 Two Times A Day- Ran Out of Byslotic 6)  Hydrochlorothiazide 12.5 Mg Tabs  (Hydrochlorothiazide) .Marland Kitchen.. 1 By Mouth Daily 7)  Furosemide 40 Mg Tabs (Furosemide) .Marland Kitchen.. 1 By Mouth Two Times A Day As Needed 8)  Klor-Con M20 20 Meq Cr-Tabs (Potassium Chloride Crys Cr) .Marland Kitchen.. 1 By Mouth Two Times A Day (Or As Directed) 9)  Pravastatin Sodium 40 Mg Tabs (Pravastatin Sodium) .Marland Kitchen.. 1 By Mouth Daily 10)  Allopurinol 300 Mg Tabs (Allopurinol) .... Take 1 Tablet By Mouth Once A Day 11)  Ambien 5 Mg Tabs (Zolpidem Tartrate) .Marland Kitchen.. 1 By Mouth At Bedtime As Needed 12)  Saw Palmetto Plus  Caps (Misc Natural Products) .... Take 3 Tablet By Mouth Once A Day 13)  Depo-Testosterone 200 Mg/ml Oil (Testosterone Cypionate) .... 200mg  Im Every 3-4 Weeks 14)  Metformin Hcl 500 Mg Xr24h-Tab (Metformin Hcl) .... 4 Tabs Each Am 15)  Prodigy Blood Glucose Test  Strp (Glucose Blood) .... Check Blood Sugars Two Times A Day  Dx: 250.00 16)  Prodigy Lancets 26g  Misc (Lancets) .... Use Two Times A Day 17)  Levitra 10 Mg Tabs (Vardenafil Hcl) .... Use As Needed 1 Hour Before Intercourse 18)  Pepcid 40 Mg Tabs (Famotidine) .... One At Bedtime 19)  Diltiazem Hcl 60 Mg Tabs (Diltiazem Hcl) .... Take 1 Four Times A Day 20)  Aciphex 20 Mg  Tbec (Rabeprazole Sodium) .... Take  One 30-60 Min Before First Meal of The Day 21)  Tramadol Hcl 50 Mg Tabs (Tramadol Hcl) .... As Needed For Pain (From Dr. Nickola Major) 22)  Cialis 5 Mg Tabs (Tadalafil) .... Take 1 Tablet By Mouth Prior To Intercourse As Needed  Allergies (verified): 1)  ! Sulfa  Past History:  Past Medical History: Gout Hypertension Diabetes mellitus 2 Asthma  Rheumatic fever age 72 Arthritis      - ?juvenile rheumatoid arthritis vs sarcoidosis      - followed by Dr. Nickola Major with Rheumatology TIA   Peripheral neuropathy Mediastinal lymphadenopathy with biopsy proven sarcoidosis.................Marland KitchenDr. Craige Cotta       - CT chest 07/30/08, 09/20/09       - MTX started 03/01/09  Possible Neurosarcoidis      - Followed by Dr. Craige Cotta with [facility]  neurology Narcolepsy without Cataplexy      - MSLT 01/09/09         - MRI brain 01/29/09 ?neuro-sarcoidosis CAD - cath 06/2009 -nonobst dz     -Followed by Dr. Algie Coffer Hyperlipidemia   Review of Systems  The patient denies fever, weight loss, syncope, and abdominal pain.  c/o bilateral foot pain in bottom/soles 1st steps out of bed each AM - better after 5-53min walking - no swelling  Physical Exam  General:  overweight-appearing.  alert, well-developed, well-nourished, and cooperative to examination.    Lungs:  normal respiratory effort, no intercostal retractions or use of accessory muscles; normal breath sounds bilaterally - no crackles and no wheezes.    Heart:  normal rate, regular rhythm, no murmur, and no rub. BLE with trace edema.   Diabetes Management Exam:    Foot Exam (with socks and/or shoes not present):       Sensory-Pinprick/Light touch:          Left medial foot (L-4): normal          Left dorsal foot (L-5): normal          Left lateral foot (S-1): normal          Right medial foot (L-4): normal          Right dorsal foot (L-5): normal          Right lateral foot (S-1): normal       Sensory-Monofilament:          Left foot: normal          Right foot: normal       Sensory-other: tender to palp bilateral plantar fasc       Inspection:          Left foot: normal          Right foot: normal       Nails:          Left foot: normal          Right foot: normal   Impression & Recommendations:  Problem # 1:  PLANTAR FASCIITIS, BILATERAL (ICD-728.71)  Discussed use of gel inserts, ice massage, and stretching exercises.   Problem # 2:  DYSPNEA (ICD-786.05) chronic, multifact - for stress test 11/17 - s/p pulm eval - not worse pff pred & mtx - cont to follow with rhuem as well continue as needed xopenex.   Problem # 3:  DIABETES MELLITUS, TYPE II (ICD-250.00)  improving cbgs off pred - check a1c now and defer med changes to endo next week as needed   His updated medication list for this problem includes:    Aspirin 325 Mg Tabs (Aspirin) .Marland Kitchen... 1 by mouth once daily    Metformin Hcl 500 Mg Xr24h-tab (Metformin hcl) .Marland KitchenMarland KitchenMarland KitchenMarland Kitchen 4 tabs each am  Orders: TLB-A1C / Hgb A1C (Glycohemoglobin) (83036-A1C)  Labs Reviewed: Creat: 0.8 (01/31/2010)     Last Eye Exam: No diaetic retinopathy, no diabetic macular edema, No sarcodosis found in the eyes (10/24/2009) Reviewed HgBA1c results: 7.1 (01/07/2010)  6.8 (10/07/2009)  Problem # 4:  ERECTILE DYSFUNCTION, ORGANIC (ICD-607.84)  The following medications were removed from the medication list:    Levitra 10 Mg Tabs (Vardenafil hcl) ..... Use as needed 1 hour before intercourse His updated medication list for this problem includes:    Cialis 5 Mg Tabs (Tadalafil) .Marland Kitchen... Take 1 tablet by mouth prior to intercourse as needed  improved with test replacment - cont same and cialis as needed   Problem # 5:  HYPERTENSION (ICD-401.9) off BBloc die to dyspnea symptoms - controlled now on current meds - cont same His updated medication list for this problem includes:    Metoprolol Tartrate 50 Mg Tabs (Metoprolol tartrate) .Marland Kitchen... 1 two times a day- ran out of byslotic    Hydrochlorothiazide 12.5  Mg Tabs (Hydrochlorothiazide) .Marland Kitchen... 1 by mouth daily    Furosemide 40 Mg Tabs (Furosemide) .Marland Kitchen... 1 by mouth two times a day as needed    Diltiazem Hcl 60 Mg Tabs (Diltiazem hcl) .Marland Kitchen... Take 1 four times a day  BP today: 112/86 Prior BP: 140/88 (04/02/2010)  Labs Reviewed: K+: 3.6 (01/31/2010) Creat: : 0.8 (01/31/2010)   Chol: 173 (06/25/2009)   HDL: 31 (06/25/2009)   LDL: 124 (06/25/2009)   TG: 90 (06/25/2009)  Problem # 6:  TESTICULAR HYPOFUNCTION (ICD-257.2)  Orders: Admin of patients own med IM/SQ (16109U)  Complete Medication List: 1)  Xopenex Hfa 45 Mcg/act Aero (Levalbuterol tartrate) .... Two puffs up to four times per day as needed 2)  Methotrexate Sol  .Marland Kitchen.. 8cc injection weekly not yet started has not  started 3)  Folic Acid 1 Mg Tabs (Folic acid) .... One by mouth once daily 4)  Aspirin 325 Mg Tabs (Aspirin) .Marland Kitchen.. 1 by mouth once daily 5)  Metoprolol Tartrate 50 Mg Tabs (Metoprolol tartrate) .Marland Kitchen.. 1 two times a day- ran out of byslotic 6)  Hydrochlorothiazide 12.5 Mg Tabs (Hydrochlorothiazide) .Marland Kitchen.. 1 by mouth daily 7)  Furosemide 40 Mg Tabs (Furosemide) .Marland Kitchen.. 1 by mouth two times a day as needed 8)  Klor-con M20 20 Meq Cr-tabs (Potassium chloride crys cr) .Marland Kitchen.. 1 by mouth two times a day (or as directed) 9)  Pravastatin Sodium 40 Mg Tabs (Pravastatin sodium) .Marland Kitchen.. 1 by mouth daily 10)  Allopurinol 300 Mg Tabs (Allopurinol) .... Take 1 tablet by mouth once a day 11)  Ambien 5 Mg Tabs (Zolpidem tartrate) .Marland Kitchen.. 1 by mouth at bedtime as needed 12)  Saw Palmetto Plus Caps (Misc natural products) .... Take 3 tablet by mouth once a day 13)  Depo-testosterone 200 Mg/ml Oil (Testosterone cypionate) .... 200mg  im every 3-4 weeks 14)  Metformin Hcl 500 Mg Xr24h-tab (Metformin hcl) .... 4 tabs each am 15)  Prodigy Blood Glucose Test Strp (Glucose blood) .... Check blood sugars two times a day  dx: 250.00 16)  Prodigy Lancets 26g Misc (Lancets) .... Use two times a day 17)  Pepcid 40 Mg Tabs (Famotidine) .... One at bedtime 18)  Diltiazem Hcl 60 Mg Tabs (Diltiazem hcl) .... Take 1 four times a day 19)  Aciphex 20 Mg Tbec (Rabeprazole sodium) .... Take  one 30-60 min before first meal of the day 20)  Tramadol Hcl 50 Mg Tabs (Tramadol hcl) .... As needed for pain (from dr. Nickola Major) 21)  Cialis 5 Mg Tabs (Tadalafil) .... Take 1 tablet by mouth prior to intercourse as needed  Patient Instructions: 1)  it was good to see you today. 2)  test(s) ordered today - your results will be reviewed with dr. Everardo All next week at your office visit 3)  do stretches for plantar fascitis as demonstrated each night and each AM before getting out of bed 4)  Please schedule a follow-up appointment in 3-4 months, call sooner if  problems.    Medication Administration  Injection # 1:    Medication: Testosterone Cypionat 200mg  ing    Diagnosis: TESTICULAR HYPOFUNCTION (ICD-257.2)    Route: IM    Site: LUOQ gluteus    Exp Date: 10/2011    Lot #: [id number]    Patient tolerated injection without complications    Given by: Orlan Leavens RMA (April 10, 2010 10:39 AM)  Orders Added: 1)  Admin of patients own med IM/SQ [96372M] 2)  TLB-A1C / Hgb A1C (Glycohemoglobin) [83036-A1C] 3)  Est. Patient Level IV [id number]

## 2010-07-08 NOTE — Assessment & Plan Note (Signed)
Summary: 2 week/ mbw   Copy to:  Dr. Nickola Major, Dr. Craige Cotta, Dr. Royann Shivers Primary Provider/Referring Provider:  Newt Lukes MD  CC:  2 week rov, pt c/o sob breathing is same as last visit, and pt states hw now has a rash on upper back..  History of Present Illness: 40 yo male with sarcoidosis with pulmonary and neuro involvement.  He continues to have cough, wheeze, chest congestion, and shortness of breath.  He has not noticed any improvement with increase in dose of prednisone.  He has not had fever.  His sinuses are okay, and he denies sore throat or mouth sores.  He is having some ankle swelling, but no worse than before.  He gets some relief when he uses xopenex, but this does not last.    Current Medications (verified): 1)  Qvar 80 Mcg/act Aers (Beclomethasone Dipropionate) .... 2 Puffs Two Times A Day 2)  Xopenex Hfa 45 Mcg/act Aero (Levalbuterol Tartrate) .... Two Puffs Up To Four Times Per Day As Needed 3)  Prednisone 10 Mg Tabs (Prednisone) .... 2 Tabs By Mouth Once Daily As Directed By Dr .Lendon Colonel 4)  Methotrexate 2.5 Mg Tabs (Methotrexate Sodium) .... Seven By Mouth Once Per Week 5)  Folic Acid 1 Mg Tabs (Folic Acid) .... One By Mouth Once Daily 6)  Aspirin 325 Mg Tabs (Aspirin) .Marland Kitchen.. 1 By Mouth Once Daily 7)  Atenolol 100 Mg Tabs (Atenolol) .Marland Kitchen.. 1 By Mouth Daily 8)  Diltiazem Hcl Er Beads 240 Mg Xr24h-Cap (Diltiazem Hcl Er Beads) .Marland Kitchen.. 1 By Mouth Once Daily 9)  Hydrochlorothiazide 12.5 Mg Tabs (Hydrochlorothiazide) .Marland Kitchen.. 1 By Mouth Daily 10)  Furosemide 40 Mg Tabs (Furosemide) .Marland Kitchen.. 1 By Mouth Daily 11)  Kaon-Cl-10 10 Meq Cr-Tabs (Potassium Chloride) .... 2 By Mouth Daily 12)  Pravastatin Sodium 40 Mg Tabs (Pravastatin Sodium) .Marland Kitchen.. 1 By Mouth Daily 13)  Colcrys 0.6 Mg Tabs (Colchicine) .Marland Kitchen.. 1 By Mouth Two Times A Day As Needed 14)  Allopurinol 300 Mg Tabs (Allopurinol) .... Take 1 Tablet By Mouth Once A Day 15)  Lactulose 10 Gm/62ml Soln (Lactulose) .... 2 Teaspoons 3 Times A  Day 16)  Ambien 5 Mg Tabs (Zolpidem Tartrate) .Marland Kitchen.. 1 By Mouth At Bedtime As Needed 17)  Saw Palmetto Plus  Caps (Misc Natural Products) .... Take 3 Tablet By Mouth Once A Day 18)  Calcium D-Glucarate 500 Mg Caps (Nutritional Supplements) .Marland Kitchen.. 1 Per Day 19)  Cialis 5 Mg Tabs (Tadalafil) .Marland Kitchen.. 1 By Mouth Prior To Intercourse As Needed 20)  Depo-Testosterone 200 Mg/ml Oil (Testosterone Cypionate) .... 200mg  Im Every 3-4 Weeks 21)  Metformin Hcl 500 Mg Xr24h-Tab (Metformin Hcl) .... 4 Tabs Each Am 22)  Glucophage 500 Mg Tabs (Metformin Hcl) .... 4 By Mouth Daily 23)  Prodigy Blood Glucose Monitor  Devi (Blood Glucose Monitoring Suppl) .... Use As Directed 24)  Prodigy Blood Glucose Test  Strp (Glucose Blood) .... Check Blood Sugars Two Times A Day  Dx: 250.00 25)  Prodigy Lancets 26g  Misc (Lancets) .... Use Two Times A Day  Allergies (verified): 1)  ! Sulfa  Past History:  Past Medical History: Reviewed history from 11/19/2009 and no changes required. Gout Hypertension Diabetes mellitus 2 Asthma Rheumatic fever age 62 Arthritis      - ?juvenile rheumatoid arthritis vs sarcoidosis      - followed by Dr. Nickola Major with Rheumatology TIA Peripheral neuropathy Mediastinal lymphadenopathy with biopsy proven sarcoidosis - Dr. Craige Cotta       - CT  chest 07/30/08       -CT chest 09/20/09 Interval development of numerous 2- 3 mm pulmonary nodules typical for Sarcoid,  Slight         interval decrease in the size of mildly enlarged mediastinal lymph nodes.       - MTX started 03/01/09 Possible Neurosarcoidis      - Followed by Dr. Craige Cotta with [facility] Narcolepsy without Cataplexy      - MSLT 01/09/09       - MRI brain 01/29/09 ?neuro-sarcoidosis CAD - cath 06/2009 -nonobst dz     -follows with [facility]ann [facility] at [facility] hyperlipidemia  Past Surgical History: Reviewed history from 12/13/2008 and no changes required. Bronchoscopy 08/21/08 Mediastinoscopy 11/30/08  Vital  Signs:  Patient profile:   40 year old male Height:      75 inches Weight:      288 pounds O2 Sat:      98 % on Room air Temp:     98.0 degrees F oral Pulse rate:   65 / minute BP sitting:   122 / 78  (right arm) Cuff size:   large  Vitals Entered By: Denna Haggard, CMA (December 03, 2009 11:59 AM)  O2 Sat at Rest %:  98% O2 Flow:  Room air  Reason for Visit 2 week follow up no improvements. New complaint of rash  Physical Exam  General:  obese.   Nose:  no deformity, discharge, inflammation, or lesions Mouth:  no exudate Neck:  no JVD.   Lungs:  diminished breath sounds, no wheeze or rales Heart:  regular rate and rhythm, S1, S2 without murmurs, rubs, gallops, or clicks Extremities:  minimal ankle edema Cervical Nodes:  no significant adenopathy   Impression & Recommendations:  Problem # 1:  DYSPNEA (ICD-786.05) He has persistent symptoms of dypnea.  He does not have clinical evidence for heart failure, and his most recent chest xray was unimpressive.  He did not respond to higher prednisone dose, where as before his pulmonary sarcoid would respond to increased prednisone.  He has been using his inhalers, and has partial relief with these.  Problem # 2:  PULMONARY SARCOIDOSIS (ICD-135) Will gradually taper his prednisone dose and monitor his symptoms.  He is on methotrexate per rheumatology.  Problem # 3:  ASTHMA (ICD-493.90) I will continue him on Qvar and as needed xopenex.    He is on atenolol, and this could be contributing to bronchospasm.  I will change him to a more cardioselective beta blocker (metoprolol) to see if this helps improve his symptoms.  Medications Added to Medication List This Visit: 1)  Prednisone 10 Mg Tabs (Prednisone) .... 3 pills once daily for 4 days, 2 pills once daily for 4 days, 1.5 pills once daily for 4 days, then 1 pill once daily 2)  Metoprolol Tartrate 50 Mg Tabs (Metoprolol tartrate) .... One two times a day  Complete Medication  List: 1)  Qvar 80 Mcg/act Aers (Beclomethasone dipropionate) .... 2 puffs two times a day 2)  Xopenex Hfa 45 Mcg/act Aero (Levalbuterol tartrate) .... Two puffs up to four times per day as needed 3)  Prednisone 10 Mg Tabs (Prednisone) .... 3 pills once daily for 4 days, 2 pills once daily for 4 days, 1.5 pills once daily for 4 days, then 1 pill once daily 4)  Methotrexate 2.5 Mg Tabs (Methotrexate sodium) .... Seven by mouth once per week 5)  Folic Acid 1 Mg Tabs (Folic acid) .... One  by mouth once daily 6)  Aspirin 325 Mg Tabs (Aspirin) .Marland Kitchen.. 1 by mouth once daily 7)  Metoprolol Tartrate 50 Mg Tabs (Metoprolol tartrate) .... One two times a day 8)  Diltiazem Hcl Er Beads 240 Mg Xr24h-cap (Diltiazem hcl er beads) .Marland Kitchen.. 1 by mouth once daily 9)  Hydrochlorothiazide 12.5 Mg Tabs (Hydrochlorothiazide) .Marland Kitchen.. 1 by mouth daily 10)  Furosemide 40 Mg Tabs (Furosemide) .Marland Kitchen.. 1 by mouth daily 11)  Kaon-cl-10 10 Meq Cr-tabs (Potassium chloride) .... 2 by mouth daily 12)  Pravastatin Sodium 40 Mg Tabs (Pravastatin sodium) .Marland Kitchen.. 1 by mouth daily 13)  Colcrys 0.6 Mg Tabs (Colchicine) .Marland Kitchen.. 1 by mouth two times a day as needed 14)  Allopurinol 300 Mg Tabs (Allopurinol) .... Take 1 tablet by mouth once a day 15)  Lactulose 10 Gm/79ml Soln (Lactulose) .... 2 teaspoons 3 times a day 16)  Ambien 5 Mg Tabs (Zolpidem tartrate) .Marland Kitchen.. 1 by mouth at bedtime as needed 17)  Saw Palmetto Plus Caps (Misc natural products) .... Take 3 tablet by mouth once a day 18)  Calcium D-glucarate 500 Mg Caps (Nutritional supplements) .Marland Kitchen.. 1 per day 19)  Cialis 5 Mg Tabs (Tadalafil) .Marland Kitchen.. 1 by mouth prior to intercourse as needed 20)  Depo-testosterone 200 Mg/ml Oil (Testosterone cypionate) .... 200mg  im every 3-4 weeks 21)  Metformin Hcl 500 Mg Xr24h-tab (Metformin hcl) .... 4 tabs each am 22)  Glucophage 500 Mg Tabs (Metformin hcl) .... 4 by mouth daily 23)  Prodigy Blood Glucose Monitor Devi (Blood glucose monitoring suppl) .... Use as  directed 24)  Prodigy Blood Glucose Test Strp (Glucose blood) .... Check blood sugars two times a day  dx: 250.00 25)  Prodigy Lancets 26g Misc (Lancets) .... Use two times a day  Other Orders: Est. Patient Level IV (16109)  Patient Instructions: 1)  Stop atenolol 2)  Metoprolol 50 mg two times a day 3)  Prednisone 10 mg pills: 3 pills once daily for 4 days, 2 pills once daily for 4 days, 1.5 pills once daily for 4 days, then 1 pill once daily until next appointment 4)  Qvar two puffs two times a day 5)  Xopenex two puffs up to four times per day as needed for cough, wheeze, chest congestion, or shortness of breath 6)  Follow up in 4 weeks Prescriptions: METOPROLOL TARTRATE 50 MG TABS (METOPROLOL TARTRATE) one two times a day  #60 x 3   Entered and Authorized by:   [name] MD   Signed by:   [name] MD on 12/03/2009   Method used:   Electronically to        [facility] (retail)       [address]       [place], [place]  [address]       Ph: [phone]       Fax: [phone]   RxID:   [id number] PREDNISONE 10 MG TABS (PREDNISONE) 3 pills once daily for 4 days, 2 pills once daily for 4 days, 1.5 pills once daily for 4 days, then 1 pill once daily  #60 x 1   Entered and Authorized by:   [name] MD   Signed by:   [name] MD on 12/03/2009   Method used:   Electronically to        [facility] (retail)       [address]       [place]  [place], [place]  [address]       Ph: [phone]       Fax: [phone]   RxID:   [id number]

## 2010-07-08 NOTE — Progress Notes (Signed)
Summary: diltiazem  Phone Note From Pharmacy   Caller: [facility] Request: Speak with Provider Summary of Call: Have rx for Taztia XT 240mg . Pt states XT med is too expensive for pt. Want to know can switch to immediate release Diltiazem. Pls advise? Initial call taken by: Orlan Leavens RMA,  January 20, 2010 2:37 PM  Follow-up for Phone Call        we can - but this would be 60mg  four times a day - if pt willing to take med four times a day, ok to send (60mg  tab, #120) thanks Follow-up by: Newt Lukes MD,  January 20, 2010 2:53 PM  Additional Follow-up for Phone Call Additional follow up Details #1::        Notified pt to let him know he would have to take new BP med 4 times a day. he states would be ok insur wouldnt cover the 240mg . [facility] Additional Follow-up by: Orlan Leavens RMA,  January 20, 2010 3:03 PM    New/Updated Medications: DILTIAZEM HCL 60 MG TABS (DILTIAZEM HCL) take 1 four times a day Prescriptions: DILTIAZEM HCL 60 MG TABS (DILTIAZEM HCL) take 1 four times a day  #120 x 5   Entered by:   Orlan Leavens RMA   Authorized by:   Newt Lukes MD   Signed by:   Orlan Leavens RMA on 01/20/2010   Method used:   [facility]        [facility]       [address]       [place], [place]  [address]       Ph: [phone]       Fax: [phone]   RxID:   [id number]

## 2010-07-08 NOTE — Assessment & Plan Note (Signed)
Summary: PER PT 1 MTH TEST--VL--STC  Nurse Visit   Allergies: 1)  ! Sulfa  Medication Administration  Injection # 1:    Medication: Testosterone Cypionat 200mg  ing    Diagnosis: TESTICULAR HYPOFUNCTION (ICD-257.2)    Route: IM    Site: RUOQ gluteus    Exp Date: 10/2011    Lot #: [id number]    Mfr: Abraxis    Patient tolerated injection without complications    Given by: Brenton Grills MA (March 13, 2010 9:39 AM)  Orders Added: 1)  Admin of patients own med IM/SQ [phone]

## 2010-07-08 NOTE — Consult Note (Signed)
Summary: [facility]   Imported By: Sherian Rein 09/05/2009 13:25:24  _____________________________________________________________________  External Attachment:    Type:   Image     Comment:   External Document

## 2010-07-08 NOTE — Progress Notes (Signed)
Summary: prodigy supplies  Phone Note From Pharmacy   Caller: [facility] DrMarland Kitchen Summary of Call: Recieved srx for onetouch ultra strip. Medicaid does not cover. Ok to change to prodig[employer] trsting supplies? also will need a meter rx. Initial call taken by: Orlan Leavens,  Oct 15, 2009 8:43 AM    New/Updated Medications: PRODIGY BLOOD GLUCOSE MONITOR  DEVI (BLOOD GLUCOSE MONITORING SUPPL) use as directed PRODIGY BLOOD GLUCOSE TEST  STRP (GLUCOSE BLOOD) check blood sugars two times a day  dx: 250.00 PRODIGY LANCETS 26G  MISC (LANCETS) use two times a day Prescriptions: PRODIGY LANCETS 26G  MISC (LANCETS) use two times a day  #60 x 5   Entered by:   Orlan Leavens   Authorized by:   Newt Lukes MD   Signed by:   Orlan Leavens on 10/15/2009   Method used:   Electronically to        [facility] (retail)       [address]       [place], [place]  [address]       Ph: [phone]       Fax: [phone]   RxID:   [id number] PRODIGY BLOOD GLUCOSE TEST  STRP (GLUCOSE BLOOD) check blood sugars two times a day  dx: 250.00  #60 x 5   Entered by:   Orlan Leavens   Authorized by:   Newt Lukes MD   Signed by:   Orlan Leavens on 10/15/2009   Method used:   Electronically to        [facility] (retail)       [address]       [place], [place]  [address]       Ph: [phone]       Fax: [phone]   RxID:   [id number] PRODIGY BLOOD GLUCOSE MONITOR  DEVI (BLOOD GLUCOSE MONITORING SUPPL) use as directed  #1 x 0   Entered by:   Orlan Leavens   Authorized by:   Newt Lukes MD   Signed by:   Orlan Leavens on 10/15/2009   Method used:   Electronically to        [facility] (retail)       [address]       [place], [place]  [address]       Ph: [phone]       Fax: [phone]   RxID:   [id number]

## 2010-07-08 NOTE — Letter (Signed)
Summary: [facility]   Imported By: Sherian Rein 11/27/2009 09:49:30  _____________________________________________________________________  External Attachment:    Type:   Image     Comment:   External Document

## 2010-07-08 NOTE — Progress Notes (Signed)
----   Converted from flag ---- ---- 10/07/2009 3:56 PM, Verdell Face wrote: appt 5/12@2 :30 w/sae.  ---- 10/07/2009 12:01 PM, Dagoberto Reef wrote: Please schedule with Dr Everardo All.    Thanks  ---- 10/07/2009 11:58 AM, Newt Lukes MD wrote: The following orders have been entered for this patient and placed on Admin Hold:  Type:     Referral       Code:   Endocrine Description:   Endocrinology Referral Order Date:   10/07/2009   Authorized By:   Newt Lukes MD Order #:   [id number] Clinical Notes:   dr. Everardo All - eval and tx ------------------------------

## 2010-07-08 NOTE — Assessment & Plan Note (Signed)
Summary: rov ///kp   Copy to:  Dr. Nickola Major, Dr. Craige Cotta Primary Provider/Referring Provider:  Orpah Cobb  CC:  TIA"s, Pt C/O chest  X 2 wks intermittently, achy, does not raqdiate , and PT also complains of night sweats X 4wks.  History of Present Illness: I saw [name] in follow up for his pulmonary sarcoid with possible neurosarcoid, narcolepsy w/o cataplexy  He was admitted recently for TIA.  He is now seeing Dr. Craige Cotta with [facility], and is having further evaluation for his possible neurosarcoidosis.  He had an elevated ammonia level, and was started on lactulose.  He was seen by Dr. Nickola Major with rheumatology.  He is now off predisone.  He has been getting some night sweats, and some cough.  He denies sputum or fever.  These have been present for about one month as he was coming down on the dose of his prednisone.  Allergies: 1)  ! Sulfa  CXR  Procedure date:  07/22/2009  Findings:       Comparison: Portable chest x-ray 06/24/2009.  Two-view chest x-ray   05/14/2009 and 01/06/2006.    Findings: Heart enlarged but stable.  Pulmonary vascularity normal.   Lungs clear.  No pleural effusions.    IMPRESSION:   Stable cardiomegaly.  No acute cardiopulmonary disease.    Past History:  Past Medical History: Gout Hypertension Diabetes mellitus Asthma Rheumatic fever age 31 Arthritis      - ?juvenile rheumatoid arthritis vs sarcoidosis      - followed by Dr. Nickola Major with Rheumatology TIA Peripheral neuropathy Mediastinal lymphadenopathy with biopsy proven sarcoidosis       - CT chest 07/30/08       - MTX started 03/01/09 Possible Neurosarcoidis      - Followed by Dr. Craige Cotta with [facility] TIA Narcolepsy without Cataplexy      - MSLT 01/09/09       - MRI brain 01/29/09 ?neuro-sarcoidosis  Vital Signs:  Patient profile:   40 year old male Height:      75 inches Weight:      293 pounds BMI:     36.75 O2 Sat:      97 % on Room air Temp:      98.3 degrees F oral Pulse rate:   74 / minute BP sitting:   114 / 74  (right arm) Cuff size:   large  O2 Flow:  Room air  Physical Exam  General:  obese.   Nose:  no deformity, discharge, inflammation, or lesions Mouth:  MP 4, no oral lesions Neck:  no JVD, no thyromegaly Lungs:  clear bilaterally to auscultation and percussion Heart:  regular rate and rhythm, S1, S2 without murmurs, rubs, gallops, or clicks Abdomen:  bowel sounds positive; abdomen soft and non-tender without masses, or organomegaly Extremities:  no clubbing, cyanosis, edema, or deformity noted Cervical Nodes:  no significant adenopathy   Pulmonary Function Test Date: 09/16/2009 5:14 PM Gender: Male  Pre-Spirometry FVC    Value: 3.19 L/min   % Pred: 61.40 % FEV1    Value: 2.83 L     Pred: 4.27 L     % Pred: 66.30 % FEV1/FVC  Value: 88.71 %     % Pred: 107.30 %  Comments: Moderate restriction.  No change from spirometry in 2010.  Impression & Recommendations:  Problem # 1:  PULMONARY SARCOIDOSIS (ICD-135) He did notice worsening of his symptoms as his dose of prednisone was decreased.  His spirometry today does  not show any significant change from before.  I will check his chest xray today, and determine if he has evidence for recurrence of his pulmonary sarcoidosis.  Problem # 2:  ARTHRITIS (ICD-716.90) He is to follow up with Dr. Nickola Major with Rheumatology.  If his chest xray is unremarkable, then I would defer further adjustments of his Methothrexate to Dr. Nickola Major.  Hopefully we can avoid having to restart prednisone.  Problem # 3:  NEUROSARCOIDOSIS (ICD-135) He is being followed now by Dr. Craige Cotta with [facility].  Medications Added to Medication List This Visit: 1)  Lactulose 10 Gm/57ml Soln (Lactulose) .... 2 teaspoons 3 times a day 2)  Calcium D-glucarate 500 Mg Caps (Nutritional supplements) .Marland Kitchen.. 1 per day  Complete Medication List: 1)  Methotrexate 2.5 Mg Tabs (Methotrexate sodium)  .... Three by mouth once per week 2)  Folic Acid 1 Mg Tabs (Folic acid) .... One by mouth once daily 3)  Atenolol 100 Mg Tabs (Atenolol) .Marland Kitchen.. 1 by mouth daily 4)  Diltiazem Hcl 120 Mg Tabs (Diltiazem hcl) .Marland Kitchen.. 1 two times a day 5)  Hydrochlorothiazide 12.5 Mg Tabs (Hydrochlorothiazide) .Marland Kitchen.. 1 by mouth daily 6)  Furosemide 40 Mg Tabs (Furosemide) .Marland Kitchen.. 1 by mouth daily 7)  Kaon-cl-10 10 Meq Cr-tabs (Potassium chloride) .... 2 by mouth daily 8)  Pravastatin Sodium 40 Mg Tabs (Pravastatin sodium) .Marland Kitchen.. 1 by mouth daily 9)  Glucophage 500 Mg Tabs (Metformin hcl) .Marland Kitchen.. 1 by mouth two times a day 10)  Colcrys 0.6 Mg Tabs (Colchicine) .Marland Kitchen.. 1 by mouth two times a day as needed 11)  Allopurinol 300 Mg Tabs (Allopurinol) .... Take 1 tablet by mouth once a day 12)  Lactulose 10 Gm/12ml Soln (Lactulose) .... 2 teaspoons 3 times a day 13)  Ambien 5 Mg Tabs (Zolpidem tartrate) .Marland Kitchen.. 1 by mouth at bedtime as needed 14)  Saw Palmetto Plus Caps (Misc natural products) .... Take 3 tablet by mouth once a day 15)  Xopenex Hfa 45 Mcg/act Aero (Levalbuterol tartrate) .... Two puffs up to four times per day as needed 16)  Calcium D-glucarate 500 Mg Caps (Nutritional supplements) .Marland Kitchen.. 1 per day  Other Orders: Est. Patient Level III ([address]) Spirometry w/Graph ([address]) T-2 View CXR (71020TC)  Patient Instructions: 1)  Follow up in 2 months   CardioPerfect Spirometry  ID: [id number] Patient: [name], [name] DOB: [birth year] Age: 40 Years Old Sex: Male Race: Black Physician: Newt Lukes MD Height: 75 Weight: 293 Smoker: Yes Status: Confirmed Past Medical History:  Gout Hypertension Diabetes mellitus Asthma Rheumatic fever age 89 Juvenile rheumatoid arthritis TIA Peripheral neuropathy Mediastinal lymphadenopathy with biopsy proven sarcoidosis       - CT chest 07/30/08       - MTX started 03/01/09 Narcolepsy without Cataplexy      - MSLT 01/09/09       - MRI brain 01/29/09  ?neuro-sarcoidosis Recorded: 09/16/2009 5:14 PM  Parameter  Measured Predicted %Predicted FVC     3.19        5.20        61.40 FEV1     2.83        4.27        66.30 FEV1%   88.71        82.66        107.30 PEF    9.09        10.62        85.50   Interpretation: Pre: FVC= 3.19L FEV1= 2.83L FEV1%= 88.7%  2.83/3.19 FEV1/FVC (09/16/2009 5:15:57 PM), Moderate restriction

## 2010-07-08 NOTE — Progress Notes (Signed)
Summary: appt.  Phone Note Call from Patient Call back at Home Phone [phone]   Caller: Patient Call For: rhonda Reason for Call: Referral Summary of Call: Pt said you were supposed to make an appt with a neurologist in [place] but you made it with a urologist instead. Needs appt with neurologist asap. Initial call taken by: Darletta Moll,  July 30, 2009 9:22 AM  Follow-up for Phone Call        Referral has been sent to a neurologist Dr. Craige Cotta from [facility]. Called them in reference to pending appt. I faxed all pt's information to them last week. They were to call Friday and advise of appt date and time, however, no call. Called [facility] today and they are trying to get pt in today or tomorrow to see Dr. Craige Cotta. They will call me back and let me know as well as contact the pt with this appt. No urology appt given. Only Neuro referral. Alfonso Ramus  July 30, 2009 9:43 AM Spoke with pt and he is aware of this. Alfonso Ramus  July 30, 2009 9:44 AM

## 2010-07-08 NOTE — Assessment & Plan Note (Signed)
Summary: 2 months/ mbw   Visit Type:  Follow-up Copy to:  Dr. Nickola Major, Dr. Craige Cotta, Dr. Algie Coffer Primary Provider/Referring Provider:  Newt Lukes MD  CC:  Sarcoid...patient c/o increased SOB with exertion...mausea and diarrhea since 03/31/2010...nausea is worse after eating...off methotrexate and prednisone.  History of Present Illness: 40 yo male with pulmonary sarcoid.  Also has joint and neuro involvement.  He was to start IV methotrexate over the summer.  This was on back order, and as a result he has not been on methotrexate.  He stopped prednisone in August.  He has been more short of breath over the past two days.  He has some nausea, dizziness, and continues to have diarrhea.  He feels congested in his chest but is not coughing up much sputum.  He denies fever, but stills gets occasional sweats.  His sinuses have been okay.  He has been getting more pain and stiffness in his hips, knees, and has been noticing problems with his fingers.  He did get his flu shot this season already.  CXR  Procedure date:  04/02/2010  Findings:      CHEST - 2 VIEW   Comparison: 01/31/2010   Findings: The cardiomediastinal silhouette is unremarkable. Mild interstitial prominence is stable. There is no evidence of focal airspace disease, pulmonary edema, pulmonary nodule/mass, pleural effusion, or pneumothorax. No acute bony abnormalities are identified.   IMPRESSION: Stable chest with mild interstitial lung disease   Current Medications (verified): 1)  Xopenex Hfa 45 Mcg/act Aero (Levalbuterol Tartrate) .... Two Puffs Up To Four Times Per Day As Needed 2)  Methotrexate Sol .Marland Kitchen.. 8cc Injection Weekly Not Yet Started Has Not Started 3)  Folic Acid 1 Mg Tabs (Folic Acid) .... One By Mouth Once Daily 4)  Aspirin 325 Mg Tabs (Aspirin) .Marland Kitchen.. 1 By Mouth Once Daily 5)  Metoprolol Tartrate 50 Mg Tabs (Metoprolol Tartrate) .Marland Kitchen.. 1 Two Times A Day- Ran Out of Byslotic 6)  Hydrochlorothiazide 12.5  Mg Tabs (Hydrochlorothiazide) .Marland Kitchen.. 1 By Mouth Daily 7)  Furosemide 40 Mg Tabs (Furosemide) .Marland Kitchen.. 1 By Mouth Two Times A Day As Needed 8)  Klor-Con M20 20 Meq Cr-Tabs (Potassium Chloride Crys Cr) .Marland Kitchen.. 1 By Mouth Two Times A Day (Or As Directed) 9)  Pravastatin Sodium 40 Mg Tabs (Pravastatin Sodium) .Marland Kitchen.. 1 By Mouth Daily 10)  Allopurinol 300 Mg Tabs (Allopurinol) .... Take 1 Tablet By Mouth Once A Day 11)  Ambien 5 Mg Tabs (Zolpidem Tartrate) .Marland Kitchen.. 1 By Mouth At Bedtime As Needed 12)  Saw Palmetto Plus  Caps (Misc Natural Products) .... Take 3 Tablet By Mouth Once A Day 13)  Depo-Testosterone 200 Mg/ml Oil (Testosterone Cypionate) .... 200mg  Im Every 3-4 Weeks 14)  Metformin Hcl 500 Mg Xr24h-Tab (Metformin Hcl) .... 4 Tabs Each Am 15)  Prodigy Blood Glucose Test  Strp (Glucose Blood) .... Check Blood Sugars Two Times A Day  Dx: 250.00 16)  Prodigy Lancets 26g  Misc (Lancets) .... Use Two Times A Day 17)  Levitra 10 Mg Tabs (Vardenafil Hcl) .... Use As Needed 1 Hour Before Intercourse 18)  Pepcid 40 Mg Tabs (Famotidine) .... One At Bedtime 19)  Diltiazem Hcl 60 Mg Tabs (Diltiazem Hcl) .... Take 1 Four Times A Day 20)  Aciphex 20 Mg  Tbec (Rabeprazole Sodium) .... Take  One 30-60 Min Before First Meal of The Day 21)  Tramadol Hcl 50 Mg Tabs (Tramadol Hcl) .... As Needed For Pain (From Dr. Nickola Major)  Allergies (verified): 1)  !  Sulfa  Past History:  Past Medical History: Gout Hypertension Diabetes mellitus 2 Asthma  Rheumatic fever age 46 Arthritis      - ?juvenile rheumatoid arthritis vs sarcoidosis      - followed by Dr. Nickola Major with Rheumatology TIA   Peripheral neuropathy Mediastinal lymphadenopathy with biopsy proven sarcoidosis.................Marland KitchenDr. Craige Cotta       - CT chest 07/30/08, 09/20/09       - MTX started 03/01/09  Possible Neurosarcoidis      - Followed by Dr. Craige Cotta with [facility] Narcolepsy without Cataplexy      - MSLT 01/09/09       - MRI brain 01/29/09  ?neuro-sarcoidosis CAD - cath 06/2009 -nonobst dz     -Followed by Dr. Algie Coffer Hyperlipidemia   Past Surgical History: Reviewed history from 12/13/2008 and no changes required. Bronchoscopy 08/21/08 Mediastinoscopy 11/30/08  Vital Signs:  Patient profile:   40 year old male Height:      75 inches (190.50 cm) Weight:      289 pounds (131.36 kg) BMI:     36.25 O2 Sat:      97 % on Room air Temp:     98.5 degrees F (36.94 degrees C) oral Pulse rate:   84 / minute BP sitting:   140 / 88  (right arm) Cuff size:   large  Vitals Entered By: Michel Bickers CMA (April 02, 2010 9:18 AM)  O2 Sat at Rest %:  97 O2 Flow:  Room air CC: Sarcoid...patient c/o increased SOB with exertion...mausea and diarrhea since 03/31/2010...nausea is worse after eating...off methotrexate and prednisone Comments Medications reviewed with patient Michel Bickers CMA  April 02, 2010 9:25 AM   Physical Exam  General:  obese.   Ears:  TMs intact and clear with normal canals Nose:  no deformity, discharge, inflammation, or lesions Mouth:  no exudate Neck:  no JVD.   Lungs:  diminished breath sounds, no wheeze or rales Heart:  regular rate and rhythm, S1, S2 without murmurs, rubs, gallops, or clicks Extremities:  no clubbing, cyanosis, edema, or deformity noted Neurologic:  strength normal.   Cervical Nodes:  no significant adenopathy   Impression & Recommendations:  Problem # 1:  PULMONARY SARCOIDOSIS (ICD-135) His chest xray appearance remains stable.  I will defer restarting prednisone or methotrexate for now.  Problem # 2:  ARTHRITIS (ICD-716.90) He is to follow up with rheumatology next week.  Will defer to Dr. Nickola Major about whether he needs to restart methotrexate.  Medications Added to Medication List This Visit: 1)  Methotrexate Sol  .Marland Kitchen.. 8cc injection weekly not yet started has not started  Complete Medication List: 1)  Xopenex Hfa 45 Mcg/act Aero (Levalbuterol tartrate) .... Two puffs up to  four times per day as needed 2)  Methotrexate Sol  .Marland Kitchen.. 8cc injection weekly not yet started has not started 3)  Folic Acid 1 Mg Tabs (Folic acid) .... One by mouth once daily 4)  Aspirin 325 Mg Tabs (Aspirin) .Marland Kitchen.. 1 by mouth once daily 5)  Metoprolol Tartrate 50 Mg Tabs (Metoprolol tartrate) .Marland Kitchen.. 1 two times a day- ran out of byslotic 6)  Hydrochlorothiazide 12.5 Mg Tabs (Hydrochlorothiazide) .Marland Kitchen.. 1 by mouth daily 7)  Furosemide 40 Mg Tabs (Furosemide) .Marland Kitchen.. 1 by mouth two times a day as needed 8)  Klor-con M20 20 Meq Cr-tabs (Potassium chloride crys cr) .Marland Kitchen.. 1 by mouth two times a day (or as directed) 9)  Pravastatin Sodium 40 Mg Tabs (Pravastatin sodium) .Marland Kitchen.. 1 by  mouth daily 10)  Allopurinol 300 Mg Tabs (Allopurinol) .... Take 1 tablet by mouth once a day 11)  Ambien 5 Mg Tabs (Zolpidem tartrate) .Marland Kitchen.. 1 by mouth at bedtime as needed 12)  Saw Palmetto Plus Caps (Misc natural products) .... Take 3 tablet by mouth once a day 13)  Depo-testosterone 200 Mg/ml Oil (Testosterone cypionate) .... 200mg  im every 3-4 weeks 14)  Metformin Hcl 500 Mg Xr24h-tab (Metformin hcl) .... 4 tabs each am 15)  Prodigy Blood Glucose Test Strp (Glucose blood) .... Check blood sugars two times a day  dx: 250.00 16)  Prodigy Lancets 26g Misc (Lancets) .... Use two times a day 17)  Levitra 10 Mg Tabs (Vardenafil hcl) .... Use as needed 1 hour before intercourse 18)  Pepcid 40 Mg Tabs (Famotidine) .... One at bedtime 19)  Diltiazem Hcl 60 Mg Tabs (Diltiazem hcl) .... Take 1 four times a day 20)  Aciphex 20 Mg Tbec (Rabeprazole sodium) .... Take  one 30-60 min before first meal of the day 21)  Tramadol Hcl 50 Mg Tabs (Tramadol hcl) .... As needed for pain (from dr. Nickola Major)  Other Orders: T-2 View CXR (71020TC) Est. Patient Level III ([address])  Patient Instructions: 1)  Follow up in 3 months

## 2010-07-08 NOTE — Assessment & Plan Note (Signed)
Summary: rov 4-6 wks ///kp   Visit Type:  Follow-up Copy to:  Dr. Nickola Major, Dr. Craige Cotta, Dr. Royann Shivers Primary Provider/Referring Provider:  Newt Lukes MD  CC:  Sarcoid. The patient says there is no change in his breathing. He does c/o mid back pain x2 weeks.[name]  History of Present Illness: 40 yo male with sarcoidosis with pulmonary and neuro involvement.  He did feel some improvement in his breathing after changing his beta blocker.  He still has problems with his breathing and gets fatigued easily with exertion.  He uses his xopenex, but is not sure this helps much.  He is to start methotrexate injections.  He has a cough with yellow sputum.  He denies hemoptysis, but gets some wheeze.  His sinuses have been okay, and he denies sore throat.     Current Medications (verified): 1)  Xopenex Hfa 45 Mcg/act Aero (Levalbuterol Tartrate) .... Two Puffs Up To Four Times Per Day As Needed 2)  Methotrexate Sol .[name].. 8cc Injection Weekly 3)  Folic Acid 1 Mg Tabs (Folic Acid) .... One By Mouth Once Daily 4)  Aspirin 325 Mg Tabs (Aspirin) .[name].. 1 By Mouth Once Daily 5)  Metoprolol Tartrate 50 Mg Tabs (Metoprolol Tartrate) .... One Two Times A Day 6)  Hydrochlorothiazide 12.5 Mg Tabs (Hydrochlorothiazide) .[name].. 1 By Mouth Daily 7)  Furosemide 40 Mg Tabs (Furosemide) .[name].. 1 By Mouth Two Times A Day (Or As Directed) 8)  Klor-Con M20 20 Meq Cr-Tabs (Potassium Chloride Crys Cr) .[name].. 1 By Mouth Two Times A Day (Or As Directed) 9)  Pravastatin Sodium 40 Mg Tabs (Pravastatin Sodium) .[name].. 1 By Mouth Daily 10)  Colcrys 0.6 Mg Tabs (Colchicine) .[name].. 1 By Mouth Two Times A Day As Needed 11)  Allopurinol 300 Mg Tabs (Allopurinol) .... Take 1 Tablet By Mouth Once A Day 12)  Lactulose 10 Gm/66ml Soln (Lactulose) .... 2 Teaspoons 3 Times A Day 13)  Ambien 5 Mg Tabs (Zolpidem Tartrate) .[name].. 1 By Mouth At Bedtime As Needed 14)  Saw Palmetto Plus  Caps (Misc Natural Products) .... Take 3 Tablet By Mouth Once A  Day 15)  Calcium D-Glucarate 500 Mg Caps (Nutritional Supplements) .[name].. 1 Per Day 16)  Depo-Testosterone 200 Mg/ml Oil (Testosterone Cypionate) .... 200mg  Im Every 3-4 Weeks 17)  Metformin Hcl 500 Mg Xr24h-Tab (Metformin Hcl) .... 4 Tabs Each Am 18)  Prodigy Blood Glucose Test  Strp (Glucose Blood) .... Check Blood Sugars Two Times A Day  Dx: 250.00 19)  Prodigy Lancets 26g  Misc (Lancets) .... Use Two Times A Day 20)  Levitra 10 Mg Tabs (Vardenafil Hcl) .... Use As Needed 1 Hour Before Intercourse 21)  Pepcid 40 Mg Tabs (Famotidine) .[name].. 1 By Mouth Once Daily 22)  Diltiazem Hcl 60 Mg Tabs (Diltiazem Hcl) .... Take 1 Four Times A Day  Allergies (verified): 1)  ! Sulfa  Past History:  Past Medical History: Last updated: 01/07/2010 Gout Hypertension Diabetes mellitus 2 Asthma  Rheumatic fever age 47 Arthritis      - ?juvenile rheumatoid arthritis vs sarcoidosis      - followed by Dr. Nickola Major with Rheumatology TIA Peripheral neuropathy Mediastinal lymphadenopathy with biopsy proven sarcoidosis - Dr. Craige Cotta       - CT chest 07/30/08       -CT chest 09/20/09 Interval development of numerous 2- 3 mm pulmonary nodules typical for Sarcoid,  Slight         interval decrease in the size of mildly enlarged  mediastinal lymph nodes.       - MTX started 03/01/09 Possible Neurosarcoidis      - Followed by Dr. Craige Cotta with [facility] neurology Narcolepsy without Cataplexy      - MSLT 01/09/09       - MRI brain 01/29/09 ?neuro-sarcoidosis CAD - cath 06/2009 -nonobst dz     -follows with Dr. Francina Ames at [facility] hyper[facility]   Past Surgical History: Last updated: 12/13/2008 Bronchoscopy 08/21/08 Mediastinoscopy 11/30/08  Vital Signs:  Patient profile:   40 year old male Height:      75 inches (190.50 cm) Weight:      294.38 pounds (133.81 kg) BMI:     36.93 O2 Sat:      98 % on Room air Temp:     98.2 degrees F (36.78 degrees C) oral Pulse rate:   78 / minute BP sitting:   112  / 70  (left arm) Cuff size:   large  Vitals Entered By: Michel Bickers CMA (January 28, 2010 2:10 PM)  O2 Sat at Rest %:  98 O2 Flow:  Room air CC: Sarcoid. The patient says there is no change in his breathing. He does c/o mid back pain x2 weeks. Comments Medications reviewed. Daytime phone verified. Michel Bickers [facility]  January 28, 2010 2:11 PM   Physical Exam  General:  obese.   Nose:  no deformity, discharge, inflammation, or lesions Mouth:  no exudate Neck:  no JVD.   Lungs:  diminished breath sounds, no wheeze or rales Heart:  regular rate and rhythm, S1, S2 without murmurs, rubs, gallops, or clicks Extremities:  minimal ankle edema Cervical Nodes:  no significant adenopathy   Impression & Recommendations:  Problem # 1:  PULMONARY SARCOIDOSIS (ICD-135) He has been changed to methotrexate injection per rheumatology.  Problem # 2:  DYSPNEA (ICD-786.05) He had some improvement in his symptoms after changing his beta blocker, but still has persistent symptoms.  He still has a far amount of cough and sputum.  He can continue as needed xopenex.  I will also arrange to have his sputum specimen sent to microbiology for culture.  Medications Added to Medication List This Visit: 1)  Methotrexate Sol  .[name].. 8cc injection weekly  Complete Medication List: 1)  Xopenex Hfa 45 Mcg/act Aero (Levalbuterol tartrate) .... Two puffs up to four times per day as needed 2)  Methotrexate Sol  .[name].. 8cc injection weekly 3)  Folic Acid 1 Mg Tabs (Folic acid) .... One by mouth once daily 4)  Aspirin 325 Mg Tabs (Aspirin) .[name].. 1 by mouth once daily 5)  Metoprolol Tartrate 50 Mg Tabs (Metoprolol tartrate) .... One two times a day 6)  Hydrochlorothiazide 12.5 Mg Tabs (Hydrochlorothiazide) .[name].. 1 by mouth daily 7)  Furosemide 40 Mg Tabs (Furosemide) .[name].. 1 by mouth two times a day (or as directed) 8)  Klor-con M20 20 Meq Cr-tabs (Potassium chloride crys cr) .[name].. 1 by mouth two times a day (or as directed) 9)   Pravastatin Sodium 40 Mg Tabs (Pravastatin sodium) .[name].. 1 by mouth daily 10)  Colcrys 0.6 Mg Tabs (Colchicine) .[name].. 1 by mouth two times a day as needed 11)  Allopurinol 300 Mg Tabs (Allopurinol) .... Take 1 tablet by mouth once a day 12)  Lactulose 10 Gm/53ml Soln (Lactulose) .... 2 teaspoons 3 times a day 13)  Ambien 5 Mg Tabs (Zolpidem tartrate) .[name].. 1 by mouth at bedtime as needed 14)  Saw Palmetto Plus Caps (Misc natural products) .... Take  3 tablet by mouth once a day 15)  Calcium D-glucarate 500 Mg Caps (Nutritional supplements) .[name].. 1 per day 16)  Depo-testosterone 200 Mg/ml Oil (Testosterone cypionate) .... 200mg  im every 3-4 weeks 17)  Metformin Hcl 500 Mg Xr24h-tab (Metformin hcl) .... 4 tabs each am 18)  Prodigy Blood Glucose Test Strp (Glucose blood) .... Check blood sugars two times a day  dx: 250.00 19)  Prodigy Lancets 26g Misc (Lancets) .... Use two times a day 20)  Levitra 10 Mg Tabs (Vardenafil hcl) .... Use as needed 1 hour before intercourse 21)  Pepcid 40 Mg Tabs (Famotidine) .[name].. 1 by mouth once daily 22)  Diltiazem Hcl 60 Mg Tabs (Diltiazem hcl) .... Take 1 four times a day  Other Orders: Est. Patient Level III (81017) T-Culture, Sputum & Gram Stain (87070/[id number]) T-Culture & Smear AFB (87206/[id number]) T-Culture & Smear Fungus (87206/[id number])  Patient Instructions: 1)  will arrange for sputum specimen 2)  follow up in 2 months

## 2010-07-08 NOTE — Assessment & Plan Note (Signed)
Summary: medical clearance/cd   Vital Signs:  Patient profile:   40 year old male Height:      75 inches (190.50 cm) Weight:      291.4 pounds (132.45 kg) O2 Sat:      97 % on Room air Temp:     97.7 degrees F (36.50 degrees C) oral Pulse rate:   74 / minute BP sitting:   130 / 82  (left arm) Cuff size:   large  Vitals Entered By: Orlan Leavens (December 23, 2009 10:14 AM)  O2 Flow:  Room air CC: Medical clearance Is Patient Diabetic? Yes Did you bring your meter with you today? No Pain Assessment Patient in pain? no        Primary Care Provider:  Newt Lukes MD  CC:  Medical clearance.  History of Present Illness: here for med clearance - requested by dr. Ocie Doyne (oral surg) planning removal of 3 wisdom teeth  - has elev BP day of evla at oral surg office - here for re-eval and clearance - no CP or HA - reports compliance with meds (see below)  review of other med issues today: 1) DM2 - prednisone induced - on metformin -check cbgs at home 2 x/day - 120-170s - no longer feels thirsty - no n/v or abd pain - seeing endo for same  2) sarcoidosis - involves lungs, joints and ?brain/liver - maintained on pred and MTX for same- followed by pulm and rheum, also neuor - inc pred dose related to inc symptoms of SOB - ?inc MTX planned if liver and amonia levels normalize per pt -   3) CAD - follows with [facility] cards fro same - hosp and cath 06/2009: nonobst dx, med mgmt - no CP since that time - c/o occ end of the day ankle edema - no change in symptoms in recent months, resolves with elevation and furosemide use  4) HTN - reports compliance with ongoing medical treatment. changes in medication reviewed (prev on atenolol, changed to metoprolol by pulm 12/03/09 for pulm issues). denies adverse side effects related to current therapy.   5) dyslipidemia - reports compliance with ongoing medical treatment and no changes in medication dose or frequency. denies adverse side effects  related to current therapy. no GI or muscle c/o  6) ED - tried viagra and cialis in past - reports no interest as primary cause of problems but denies depression -  also low test level - no on gel replacement   Clinical Review Panels:  Lipid Management   Cholesterol:  173 (06/25/2009)   LDL (bad choesterol):  124 (06/25/2009)   HDL (good cholesterol):  31 (06/25/2009)   Triglycerides:  90 (06/25/2009)  Diabetes Management   HgBA1C:  6.8 (10/07/2009)   Creatinine:  0.7 (09/17/2009)   Last Dilated Eye Exam:  No diaetic retinopathy, no diabetic macular edema, No sarcodosis found in the eyes (10/24/2009)  CBC   WBC:  6.8 (06/17/2009)   RBC:  5.07 (06/17/2009)   Hgb:  13.6 (06/17/2009)   Hct:  41.1 (06/17/2009)   Platelets:  342.0 (06/17/2009)   MCV  81.0 (06/17/2009)   MCHC  33.1 (06/17/2009)   RDW  12.8 (06/17/2009)   PMN:  38.4 (06/17/2009)   Lymphs:  40.2 (06/17/2009)   Monos:  13.8 (06/17/2009)   Eosinophils:  4.5 (06/17/2009)   Basophil:  3.1 (06/17/2009)  Complete Metabolic Panel   Glucose:  153 (09/17/2009)   Sodium:  142 (09/17/2009)   Potassium:  3.4 (09/17/2009)   Chloride:  101 (09/17/2009)   CO2:  31 (09/17/2009)   BUN:  9 (09/17/2009)   Creatinine:  0.7 (09/17/2009)   Albumin:  3.6 (06/17/2009)   Total Protein:  8.5 (06/17/2009)   Calcium:  9.1 (09/17/2009)   Total Bili:  0.7 (06/17/2009)   Alk Phos:  73 (06/17/2009)   SGPT (ALT):  29 (06/17/2009)   SGOT (AST):  21 (06/17/2009)   Current Medications (verified): 1)  Qvar 80 Mcg/act Aers (Beclomethasone Dipropionate) .... 2 Puffs Two Times A Day 2)  Xopenex Hfa 45 Mcg/act Aero (Levalbuterol Tartrate) .... Two Puffs Up To Four Times Per Day As Needed 3)  Prednisone 10 Mg Tabs (Prednisone) .... 3 Pills Once Daily For 4 Days, 2 Pills Once Daily For 4 Days, 1.5 Pills Once Daily For 4 Days, Then 1 Pill Once Daily 4)  Methotrexate 2.5 Mg Tabs (Methotrexate Sodium) .... Seven By Mouth Once Per Week 5)  Folic  Acid 1 Mg Tabs (Folic Acid) .... One By Mouth Once Daily 6)  Aspirin 325 Mg Tabs (Aspirin) .Marland Kitchen.. 1 By Mouth Once Daily 7)  Metoprolol Tartrate 50 Mg Tabs (Metoprolol Tartrate) .... One Two Times A Day 8)  Diltiazem Hcl Er Beads 240 Mg Xr24h-Cap (Diltiazem Hcl Er Beads) .Marland Kitchen.. 1 By Mouth Once Daily 9)  Hydrochlorothiazide 12.5 Mg Tabs (Hydrochlorothiazide) .Marland Kitchen.. 1 By Mouth Daily 10)  Furosemide 40 Mg Tabs (Furosemide) .Marland Kitchen.. 1 By Mouth Daily 11)  Kaon-Cl-10 10 Meq Cr-Tabs (Potassium Chloride) .... 2 By Mouth Daily 12)  Pravastatin Sodium 40 Mg Tabs (Pravastatin Sodium) .Marland Kitchen.. 1 By Mouth Daily 13)  Colcrys 0.6 Mg Tabs (Colchicine) .Marland Kitchen.. 1 By Mouth Two Times A Day As Needed 14)  Allopurinol 300 Mg Tabs (Allopurinol) .... Take 1 Tablet By Mouth Once A Day 15)  Lactulose 10 Gm/52ml Soln (Lactulose) .... 2 Teaspoons 3 Times A Day 16)  Ambien 5 Mg Tabs (Zolpidem Tartrate) .Marland Kitchen.. 1 By Mouth At Bedtime As Needed 17)  Saw Palmetto Plus  Caps (Misc Natural Products) .... Take 3 Tablet By Mouth Once A Day 18)  Calcium D-Glucarate 500 Mg Caps (Nutritional Supplements) .Marland Kitchen.. 1 Per Day 19)  Cialis 5 Mg Tabs (Tadalafil) .Marland Kitchen.. 1 By Mouth Prior To Intercourse As Needed 20)  Depo-Testosterone 200 Mg/ml Oil (Testosterone Cypionate) .... 200mg  Im Every 3-4 Weeks 21)  Metformin Hcl 500 Mg Xr24h-Tab (Metformin Hcl) .... 4 Tabs Each Am 22)  Glucophage 500 Mg Tabs (Metformin Hcl) .... 4 By Mouth Daily 23)  Prodigy Blood Glucose Test  Strp (Glucose Blood) .... Check Blood Sugars Two Times A Day  Dx: 250.00 24)  Prodigy Lancets 26g  Misc (Lancets) .... Use Two Times A Day 25)  Levitra 10 Mg Tabs (Vardenafil Hcl) .... Use As Needed 1 Hour Before Intercourse  Allergies (verified): 1)  ! Sulfa  Past History:  Past medical, surgical, family and social histories (including risk factors) reviewed, and no changes noted (except as noted below).  Past Medical History: Gout Hypertension Diabetes mellitus 2 Asthma Rheumatic fever  age 64 Arthritis      - ?juvenile rheumatoid arthritis vs sarcoidosis      - followed by Dr. Nickola Major with Rheumatology TIA Peripheral neuropathy Mediastinal lymphadenopathy with biopsy proven sarcoidosis - Dr. Craige Cotta       - CT chest 07/30/08       -CT chest 09/20/09 Interval development of numerous 2- 3 mm pulmonary nodules typical for Sarcoid,  Slight  interval decrease in the size of mildly enlarged mediastinal lymph nodes.       - MTX started 03/01/09 Possible Neurosarcoidis      - Followed by Dr. Craige Cotta with [facility] neurolog[facility] Narcolepsy without Cataplexy      - MSLT 01/09/09       - MRI brain 01/29/09 ?neuro-sarcoidosis CAD - cath 06/2009 -nonobst dz     -follows with Dr. Trixie Dredge at [facility] hyperlipidemia  Past Surgical History: Reviewed history from 12/13/2008 and no changes required. Bronchoscopy 08/21/08 Mediastinoscopy 11/30/08  Family History: Reviewed history from 10/17/2009 and no changes required. Heart disease--father--deceased at age 38 dm:  both parents.  Social History: Reviewed history from 10/17/2009 and no changes required. Married., lives with wife and 3 kids currently unemployed - prev worked as a Research officer, trade union, the security at [employer]. smoker - quit briefly 07/2008 no alcohol use.  Review of Systems       see HPI above. I have reviewed all other systems and they were negative.   Physical Exam  General:  overweight-appearing.  alert, well-developed, well-nourished, and cooperative to examination.    Eyes:  vision grossly intact; pupils equal, round and reactive to light.  conjunctiva and lids normal.    Neck:  supple, full ROM, no masses, no thyromegaly; no thyroid nodules or tenderness. no JVD or carotid bruits.   Lungs:  normal respiratory effort, no intercostal retractions or use of accessory muscles; normal breath sounds bilaterally - no crackles and no wheezes.    Heart:  normal rate, regular rhythm, no murmur, and no rub. BLE  without edema. normal DP pulses and normal cap refill in all 4 extremities    Abdomen:  soft, non-tender, normal bowel sounds, no distention; no masses and no appreciable hepatomegaly or splenomegaly.   Neurologic:  alert & oriented X3 and cranial nerves II-XII symetrically intact.  strength normal in all extremities, sensation intact to light touch, and gait normal. speech fluent without dysarthria or aphasia; follows commands with good comprehension.  Skin:  no rashes, vesicles, ulcers, or erythema. No nodules or irregularity to palpation.  Psych:  Oriented X3, memory intact for recent and remote, normally interactive, good eye contact, not anxious appearing, not depressed appearing, and not agitated.      Impression & Recommendations:  Problem # 1:  PREOPERATIVE EXAMINATION (ICD-V72.84) request from oral surg dr. Barbette Merino - This patient has been evaluated and it is felt that the surgical risk is low and outweighed by the potential benefit of the surgery. Therefore, medically clear to proceed when scheduling allows.   Problem # 2:  HYPERTENSION (ICD-401.9)  improved today from eval at dr. Barbette Merino (prev 148/102) - will inc Bbloc as allowed by pulm symptoms (off atenolol due to same) EKG benign His updated medication list for this problem includes:    Metoprolol Tartrate 50 Mg Tabs (Metoprolol tartrate) ..... One two times a day    Diltiazem Hcl Er Beads 240 Mg Xr24h-cap (Diltiazem hcl er beads) .Marland Kitchen... 1 by mouth once daily    Hydrochlorothiazide 12.5 Mg Tabs (Hydrochlorothiazide) .Marland Kitchen... 1 by mouth daily    Furosemide 40 Mg Tabs (Furosemide) .Marland Kitchen... 1 by mouth daily  Orders: EKG w/ Interpretation (93000) Prescription Created Electronically [phone])  BP today: 130/82 Prior BP: 122/78 (12/03/2009)  Labs Reviewed: K+: 3.4 (09/17/2009) Creat: : 0.7 (09/17/2009)   Chol: 173 (06/25/2009)   HDL: 31 (06/25/2009)   LDL: 124 (06/25/2009)   TG: 90 (06/25/2009)  Problem # 3:  DIABETES MELLITUS, TYPE  II (ICD-250.00)  The following medications were removed from the medication list:    Glucophage 500 Mg Tabs (Metformin hcl) .Marland KitchenMarland KitchenMarland KitchenMarland Kitchen 4 by mouth daily His updated medication list for this problem includes:    Aspirin 325 Mg Tabs (Aspirin) .Marland Kitchen... 1 by mouth once daily    Metformin Hcl 500 Mg Xr24h-tab (Metformin hcl) .Marland KitchenMarland KitchenMarland KitchenMarland Kitchen 4 tabs each am  Labs Reviewed: Creat: 0.7 (09/17/2009)     Last Eye Exam: No diaetic retinopathy, no diabetic macular edema, No sarcodosis found in the eyes (10/24/2009) Reviewed HgBA1c results: 6.8 (10/07/2009)  6.5 (06/24/2009)  Problem # 4:  HYPERLIPIDEMIA (ICD-272.4)  His updated medication list for this problem includes:    Pravastatin Sodium 40 Mg Tabs (Pravastatin sodium) .Marland Kitchen... 1 by mouth daily  Labs Reviewed: SGOT: 21 (06/17/2009)   SGPT: 29 (06/17/2009)   HDL:31 (06/25/2009)  LDL:124 (06/25/2009)  Chol:173 (06/25/2009)  Trig:90 (06/25/2009)  Problem # 5:  PULMONARY SARCOIDOSIS (ICD-135) per pulm (dr. Craige Cotta): Will gradually taper his prednisone dose and monitor his symptoms.  He is on methotrexate per rheumatology.  symptoms stable today  Complete Medication List: 1)  Qvar 80 Mcg/act Aers (Beclomethasone dipropionate) .... 2 puffs two times a day 2)  Xopenex Hfa 45 Mcg/act Aero (Levalbuterol tartrate) .... Two puffs up to four times per day as needed 3)  Prednisone 10 Mg Tabs (Prednisone) .... 3 pills once daily for 4 days, 2 pills once daily for 4 days, 1.5 pills once daily for 4 days, then 1 pill once daily 4)  Methotrexate 2.5 Mg Tabs (Methotrexate sodium) .... Seven by mouth once per week 5)  Folic Acid 1 Mg Tabs (Folic acid) .... One by mouth once daily 6)  Aspirin 325 Mg Tabs (Aspirin) .Marland Kitchen.. 1 by mouth once daily 7)  Metoprolol Tartrate 50 Mg Tabs (Metoprolol tartrate) .... One two times a day 8)  Diltiazem Hcl Er Beads 240 Mg Xr24h-cap (Diltiazem hcl er beads) .Marland Kitchen.. 1 by mouth once daily 9)  Hydrochlorothiazide 12.5 Mg Tabs (Hydrochlorothiazide) .Marland Kitchen.. 1  by mouth daily 10)  Furosemide 40 Mg Tabs (Furosemide) .Marland Kitchen.. 1 by mouth daily 11)  Kaon-cl-10 10 Meq Cr-tabs (Potassium chloride) .... 2 by mouth daily 12)  Pravastatin Sodium 40 Mg Tabs (Pravastatin sodium) .Marland Kitchen.. 1 by mouth daily 13)  Colcrys 0.6 Mg Tabs (Colchicine) .Marland Kitchen.. 1 by mouth two times a day as needed 14)  Allopurinol 300 Mg Tabs (Allopurinol) .... Take 1 tablet by mouth once a day 15)  Lactulose 10 Gm/41ml Soln (Lactulose) .... 2 teaspoons 3 times a day 16)  Ambien 5 Mg Tabs (Zolpidem tartrate) .Marland Kitchen.. 1 by mouth at bedtime as needed 17)  Saw Palmetto Plus Caps (Misc natural products) .... Take 3 tablet by mouth once a day 18)  Calcium D-glucarate 500 Mg Caps (Nutritional supplements) .Marland Kitchen.. 1 per day 19)  Depo-testosterone 200 Mg/ml Oil (Testosterone cypionate) .... 200mg  im every 3-4 weeks 20)  Metformin Hcl 500 Mg Xr24h-tab (Metformin hcl) .... 4 tabs each am 21)  Prodigy Blood Glucose Test Strp (Glucose blood) .... Check blood sugars two times a day  dx: 250.00 22)  Prodigy Lancets 26g Misc (Lancets) .... Use two times a day 23)  Levitra 10 Mg Tabs (Vardenafil hcl) .... Use as needed 1 hour before intercourse  Patient Instructions: 1)  it was good to see you today. 2)  increase metoprolol dose and change to once daily dosing  - Toprol XL 200mg  once daily - your prescription has been electronically submitted to your pharmacy. Please  take as directed. Contact our office if you believe you're having problems with the medication(s).  3)  You have been evaluated and it is felt that the surgical risk is low and outweighed by the potential benefit of the surgery. Therefore, medically clear to proceed when scheduling allows. Will let Dr. Randa Evens office know same - 4)  keep scheduled follow-up appointment in 3-4 months to review, call sooner if problems.

## 2010-07-08 NOTE — Assessment & Plan Note (Signed)
Summary: 1 MTH TEST  VL  STC coming @11am /cd  Nurse Visit   Vitals Entered By: Josph Macho RMA (November 07, 2009 11:06 AM)  Allergies: 1)  ! Sulfa  Medication Administration  Injection # 1:    Medication: Testosterone Cypionat 200mg  ing    Diagnosis: TESTICULAR HYPOFUNCTION (ICD-257.2)    Route: IM    Site: RUOQ gluteus    Exp Date: 10/07/2011    Lot #: [id number]    Mfr: Abraxis    Patient tolerated injection without complications    Given by: Josph Macho RMA (November 07, 2009 11:09 AM)  Orders Added: 1)  Admin of patients own med IM/SQ [phone]

## 2010-07-08 NOTE — Progress Notes (Signed)
Summary: results  Phone Note Call from Patient   Caller: Patient Call For: Katasha Riga Summary of Call: results of labs and xrays Initial call taken by: Rickard Patience,  November 12, 2008 9:10 AM  Follow-up for Phone Call        Pt had labs done on 11/06/08 and CT on 11/07/08.  Pt calling for results.  Results in EMR unsigned.  Please advise. Thanks Follow-up by: Cloyde Reams RN,  November 12, 2008 9:12 AM

## 2010-07-08 NOTE — Progress Notes (Signed)
Summary: REFERREL  Phone Note Call from Patient   Caller: Patient Call For: [facility] Summary of Call: PATIENT NEEDED AN APPT WITH A NEUROLOGIST INSTEAD OF UROLOGIST, HE WOULD LIKE A CALL BACK AT [phone] Initial call taken by: DENA  Follow-up for Phone Call        In process of getting referral to Neurologist at [facility] in [facility], [place]. Waiting on Dr. Loyal Gambler nurse to call to schedule appt. All paperwork has be faxed to office. Pending Neuro appt. Pt aware that I will call first part of week with appt. Alfonso Ramus  July 26, 2009 5:04 PM

## 2010-07-08 NOTE — Assessment & Plan Note (Signed)
Summary: Acute NP office visit - sarcoid flare   Copy to:  Dr. Nickola Major, Dr. Craige Cotta Primary Provider/Referring Provider:  Orpah Cobb  CC:  increased SOB, dry cough, fever, night sweats, sore throat, swelling in ankles/feet x2weeks.  began prednisone 20mg  daily 09-17-09 by Dr. Lendon Colonel, and doesn't feel this is helping breathing and is keeping pt up at night.  History of Present Illness: 40 yo male with known hx of  pulmonary sarcoid with possible neurosarcoid, narcolepsy w/o cataplexy Also hx of TIA f/by  Dr. Craige Cotta with [facility], He is f/by  Dr. Nickola Major with rheumatology.  09/16/09--Follow up . He is now off predisone.  He has been getting some night sweats, and some cough.  He denies sputum or fever.  These have been present for about one month as he was coming down on the dose of his prednisone.  October 03, 2009--Presents for an acute office visit. Complains of persistent symptoms of cough, fatigue, night sweats. Started on Prednisone 20mg  once daily by Dr. Nickola Major. Has not seen a big improvement in symptoms. He has has some increased wheeizng, allergy symptoms w/ nasal drainage, congestion despite taking zyrtec on most days.   Last visit, FEV1 was at 66%.   CT chest on 4/ showed Interval development of numerous 2- 3 mm pulmonary nodules typical for Sarcoid,  Slight interval decrease in the size of mildly enlarged mediastinal lymph nodes. No discolored mucus or fever.    Medications Prior to Update: 1)  Methotrexate 2.5 Mg Tabs (Methotrexate Sodium) .... Three By Mouth Once Per Week 2)  Folic Acid 1 Mg Tabs (Folic Acid) .... One By Mouth Once Daily 3)  Atenolol 100 Mg Tabs (Atenolol) .Marland Kitchen.. 1 By Mouth Daily 4)  Diltiazem Hcl 120 Mg Tabs (Diltiazem Hcl) .Marland Kitchen.. 1 Two Times A Day 5)  Hydrochlorothiazide 12.5 Mg Tabs (Hydrochlorothiazide) .Marland Kitchen.. 1 By Mouth Daily 6)  Furosemide 40 Mg Tabs (Furosemide) .Marland Kitchen.. 1 By Mouth Daily 7)  Kaon-Cl-10 10 Meq Cr-Tabs (Potassium Chloride) .... 2 By Mouth  Daily 8)  Pravastatin Sodium 40 Mg Tabs (Pravastatin Sodium) .Marland Kitchen.. 1 By Mouth Daily 9)  Glucophage 500 Mg Tabs (Metformin Hcl) .Marland Kitchen.. 1 By Mouth Two Times A Day 10)  Colcrys 0.6 Mg Tabs (Colchicine) .Marland Kitchen.. 1 By Mouth Two Times A Day As Needed 11)  Allopurinol 300 Mg Tabs (Allopurinol) .... Take 1 Tablet By Mouth Once A Day 12)  Lactulose 10 Gm/4ml Soln (Lactulose) .... 2 Teaspoons 3 Times A Day 13)  Ambien 5 Mg Tabs (Zolpidem Tartrate) .Marland Kitchen.. 1 By Mouth At Bedtime As Needed 14)  Saw Palmetto Plus  Caps (Misc Natural Products) .... Take 3 Tablet By Mouth Once A Day 15)  Xopenex Hfa 45 Mcg/act Aero (Levalbuterol Tartrate) .... Two Puffs Up To Four Times Per Day As Needed 16)  Calcium D-Glucarate 500 Mg Caps (Nutritional Supplements) .Marland Kitchen.. 1 Per Day  Current Medications (verified): 1)  Methotrexate 2.5 Mg Tabs (Methotrexate Sodium) .... Three By Mouth Once Per Week 2)  Folic Acid 1 Mg Tabs (Folic Acid) .... One By Mouth Once Daily 3)  Atenolol 100 Mg Tabs (Atenolol) .Marland Kitchen.. 1 By Mouth Daily 4)  Diltiazem Hcl 120 Mg Tabs (Diltiazem Hcl) .Marland Kitchen.. 1 Two Times A Day 5)  Hydrochlorothiazide 12.5 Mg Tabs (Hydrochlorothiazide) .Marland Kitchen.. 1 By Mouth Daily 6)  Furosemide 40 Mg Tabs (Furosemide) .Marland Kitchen.. 1 By Mouth Daily 7)  Kaon-Cl-10 10 Meq Cr-Tabs (Potassium Chloride) .... 2 By Mouth Daily 8)  Pravastatin Sodium 40 Mg Tabs (Pravastatin Sodium) .Marland KitchenMarland KitchenMarland Kitchen  1 By Mouth Daily 9)  Glucophage 500 Mg Tabs (Metformin Hcl) .Marland Kitchen.. 1 By Mouth Two Times A Day 10)  Colcrys 0.6 Mg Tabs (Colchicine) .Marland Kitchen.. 1 By Mouth Two Times A Day As Needed 11)  Allopurinol 300 Mg Tabs (Allopurinol) .... Take 1 Tablet By Mouth Once A Day 12)  Lactulose 10 Gm/51ml Soln (Lactulose) .... 2 Teaspoons 3 Times A Day 13)  Ambien 5 Mg Tabs (Zolpidem Tartrate) .Marland Kitchen.. 1 By Mouth At Bedtime As Needed 14)  Saw Palmetto Plus  Caps (Misc Natural Products) .... Take 3 Tablet By Mouth Once A Day 15)  Xopenex Hfa 45 Mcg/act Aero (Levalbuterol Tartrate) .... Two Puffs Up To Four  Times Per Day As Needed 16)  Calcium D-Glucarate 500 Mg Caps (Nutritional Supplements) .Marland Kitchen.. 1 Per Day 17)  Prednisone 10 Mg Tabs (Prednisone) .... 2 Tabs By Mouth Once Daily As Directed By Dr .Lendon Colonel  Allergies (verified): 1)  ! Sulfa  Past History:  Past Surgical History: Last updated: 12/13/2008 Bronchoscopy 08/21/08 Mediastinoscopy 11/30/08  Family History: Last updated: 08/10/2008 Heart disease--father--deceased at age 37  Social History: Last updated: 08/10/2008 Married.  Has two daughters.  Currently a Consulting civil engineer.  Worked as a Research officer, trade union.  Risk Factors: Smoking Status: quit (08/10/2008)  Past Medical History: Gout Hypertension Diabetes mellitus Asthma Rheumatic fever age 50 Arthritis      - ?juvenile rheumatoid arthritis vs sarcoidosis      - followed by Dr. Nickola Major with Rheumatology TIA Peripheral neuropathy Mediastinal lymphadenopathy with biopsy proven sarcoidosis       - CT chest 07/30/08       -CT chest 09/20/09 Interval development of numerous 2- 3 mm pulmonary nodules typical for Sarcoid,  Slight interval decrease in the size of mildly enlarged mediastinal lymph nodes.       - MTX started 03/01/09 Possible Neurosarcoidis      - Followed by Dr. Craige Cotta with [facility] TIA Narcolepsy without Cataplexy      - MSLT 01/09/09       - MRI brain 01/29/09 ?neuro-sarcoidosis  Review of Systems      See HPI  Vital Signs:  Patient profile:   40 year old male Height:      75 inches Weight:      297.13 pounds BMI:     37.27 O2 Sat:      99 % on Room air Temp:     98.7 degrees F oral Pulse rate:   85 / minute BP sitting:   128 / 80  (left arm) Cuff size:   large  Vitals Entered By: Boone Master CNA (October 03, 2009 10:28 AM)  O2 Flow:  Room air CC: increased SOB, dry cough, fever, night sweats, sore throat, swelling in ankles/feet x2weeks.  began prednisone 20mg  daily 09-17-09 by Dr. Lendon Colonel, doesn't feel this is helping breathing and is keeping pt up at  night Is Patient Diabetic? Yes Comments Medications reviewed with patient Daytime contact number verified with patient. Boone Master CNA  October 03, 2009 10:28 AM    Physical Exam  Additional Exam:  GEN: A/Ox3; pleasant , NAD HEENT:  [place]/AT, , EACs-clear, TMs-wnl, NOSE-clear discharge  THROAT-clear NECK:  Supple w/ fair ROM; no JVD; normal carotid impulses w/o bruits; no thyromegaly or nodules palpated; no lymphadenopathy. RESP  Clear to P & A; w/o, wheezes/ rales/ or rhonchi. CARD:  RRR, no m/r/g   GI:   Soft & nt; nml bowel sounds; no organomegaly or masses detected. Musco: Warm bil,  no calf tenderness edema, clubbing, pulses intact Neuro: intact w/ no focal deficits noted  Skin: intact w/ no rash.    Impression & Recommendations:  Problem # 1:  PULMONARY SARCOIDOSIS (ICD-135) Flare w/ assoicated rhinitis, may have components of reactive airways-? ICS may help His main complaint is dyspnea/DOE w/ intermittent wheezing and rhinitis symptoms  Prednisone 20mg  should be adequate to control sarcoid flare.  REC: Trial of ICS to see if this helps w/ symptoms control  Begin QVAR 2 puffs two times a day , brush , rinse , and gargle after use.  Begin Nasonex 2 puffs two times a day  Continue on Zyrtec 10mg  at bedtime  Saline nasal rinses two times a day as needed  follow up 2 weeks Dr. Craige Cotta.  Stay on Prednisone at 20mg  once daily  Please contact office for sooner follow up if symptoms do not improve or worsen   Medications Added to Medication List This Visit: 1)  Prednisone 10 Mg Tabs (Prednisone) .... 2 tabs by mouth once daily as directed by dr .Lendon Colonel 2)  Qvar 80 Mcg/act Aers (Beclomethasone dipropionate) .... 2 puffs two times a day  Complete Medication List: 1)  Methotrexate 2.5 Mg Tabs (Methotrexate sodium) .... Three by mouth once per week 2)  Folic Acid 1 Mg Tabs (Folic acid) .... One by mouth once daily 3)  Atenolol 100 Mg Tabs (Atenolol) .Marland Kitchen.. 1 by mouth daily 4)   Diltiazem Hcl 120 Mg Tabs (Diltiazem hcl) .Marland Kitchen.. 1 two times a day 5)  Hydrochlorothiazide 12.5 Mg Tabs (Hydrochlorothiazide) .Marland Kitchen.. 1 by mouth daily 6)  Furosemide 40 Mg Tabs (Furosemide) .Marland Kitchen.. 1 by mouth daily 7)  Kaon-cl-10 10 Meq Cr-tabs (Potassium chloride) .... 2 by mouth daily 8)  Pravastatin Sodium 40 Mg Tabs (Pravastatin sodium) .Marland Kitchen.. 1 by mouth daily 9)  Glucophage 500 Mg Tabs (Metformin hcl) .Marland Kitchen.. 1 by mouth two times a day 10)  Colcrys 0.6 Mg Tabs (Colchicine) .Marland Kitchen.. 1 by mouth two times a day as needed 11)  Allopurinol 300 Mg Tabs (Allopurinol) .... Take 1 tablet by mouth once a day 12)  Lactulose 10 Gm/78ml Soln (Lactulose) .... 2 teaspoons 3 times a day 13)  Ambien 5 Mg Tabs (Zolpidem tartrate) .Marland Kitchen.. 1 by mouth at bedtime as needed 14)  Saw Palmetto Plus Caps (Misc natural products) .... Take 3 tablet by mouth once a day 15)  Xopenex Hfa 45 Mcg/act Aero (Levalbuterol tartrate) .... Two puffs up to four times per day as needed 16)  Calcium D-glucarate 500 Mg Caps (Nutritional supplements) .Marland Kitchen.. 1 per day 17)  Prednisone 10 Mg Tabs (Prednisone) .... 2 tabs by mouth once daily as directed by dr .Lendon Colonel 18)  Qvar 80 Mcg/act Aers (Beclomethasone dipropionate) .... 2 puffs two times a day  Other Orders: Est. Patient Level IV (16109)  Patient Instructions: 1)  Begin QVAR 2 puffs two times a day , brush , rinse , and gargle after use.  2)  Begin Nasonex 2 puffs two times a day  3)  Continue on Zyrtec 10mg  at bedtime  4)  Saline nasal rinses two times a day as needed  5)  follow up 2 weeks Dr. Craige Cotta.  6)  Stay on Prednisone at 20mg  once daily  7)  Please contact office for sooner follow up if symptoms do not improve or worsen

## 2010-07-10 ENCOUNTER — Other Ambulatory Visit: Payer: Self-pay | Admitting: Internal Medicine

## 2010-07-10 ENCOUNTER — Encounter: Payer: Self-pay | Admitting: Internal Medicine

## 2010-07-10 ENCOUNTER — Other Ambulatory Visit: Payer: Self-pay

## 2010-07-10 ENCOUNTER — Ambulatory Visit (INDEPENDENT_AMBULATORY_CARE_PROVIDER_SITE_OTHER): Payer: Medicaid Other | Admitting: Internal Medicine

## 2010-07-10 ENCOUNTER — Ambulatory Visit: Admit: 2010-07-10 | Payer: Self-pay | Admitting: Internal Medicine

## 2010-07-10 DIAGNOSIS — E119 Type 2 diabetes mellitus without complications: Secondary | ICD-10-CM

## 2010-07-10 DIAGNOSIS — I10 Essential (primary) hypertension: Secondary | ICD-10-CM

## 2010-07-10 DIAGNOSIS — E785 Hyperlipidemia, unspecified: Secondary | ICD-10-CM

## 2010-07-10 DIAGNOSIS — E291 Testicular hypofunction: Secondary | ICD-10-CM

## 2010-07-10 LAB — HEMOGLOBIN A1C: Hgb A1c MFr Bld: 7 % — ABNORMAL HIGH (ref 4.6–6.5)

## 2010-07-10 LAB — CONVERTED CEMR LAB: Testosterone: 246.3 ng/dL — ABNORMAL LOW (ref 250–890)

## 2010-07-10 NOTE — Progress Notes (Signed)
Summary: lopressor  Phone Note Refill Request Message from:  Fax from Pharmacy on June 03, 2010 1:49 PM  Refills Requested: Medication #1:  METOPROLOL TARTRATE 50 MG TABS 1 two times a day- RAN OUT OF BYSLOTIC  Method Requested: Electronic Initial call taken by: Orlan Leavens RMA,  June 03, 2010 1:49 PM    New/Updated Medications: METOPROLOL TARTRATE 50 MG TABS (METOPROLOL TARTRATE) 1 two times a day Prescriptions: METOPROLOL TARTRATE 50 MG TABS (METOPROLOL TARTRATE) 1 two times a day  #60 x 5   Entered by:   Orlan Leavens RMA   Authorized by:   Newt Lukes MD   Signed by:   Orlan Leavens RMA on 06/03/2010   Method used:   Electronically to        [facility] (retail)       [address]       [place], [place]  [address]       Ph: [phone]       Fax: [phone]   RxID:   [id number]

## 2010-07-10 NOTE — Progress Notes (Signed)
Summary: rx refill-testosterone  Phone Note Refill Request Message from:  Fax from Pharmacy on June 30, 2010 11:40 AM  Refills Requested: Medication #1:  DEPO-TESTOSTERONE 200 MG/ML OIL 200mg  IM every 3-4 weeks   Dosage confirmed as above?Dosage Confirmed   Last Refilled: 10/09/2009   Notes: [facility] fax # [phone]  Method Requested: Fax to Local Pharmacy Next Appointment Scheduled: 07/10/2010 Initial call taken by: Brenton Grills CMA Duncan Dull),  June 30, 2010 11:40 AM  Follow-up for Phone Call        ok to fill as prev rx'd - signed and put on triage b desk - thanks Follow-up by: Newt Lukes MD,  June 30, 2010 12:27 PM  Additional Follow-up for Phone Call Additional follow up Details #1::        Rx faxed to pharmacy Additional Follow-up by: Margaret Pyle, CMA,  June 30, 2010 12:53 PM    Prescriptions: DEPO-TESTOSTERONE 200 MG/ML OIL (TESTOSTERONE CYPIONATE) 200mg  IM every 3-4 weeks  #1 bottle x 3   Entered and Authorized by:   Newt Lukes MD   Signed by:   Newt Lukes MD on 06/30/2010   Method used:   Printed then faxed to ...       [facility]       [address]       [place], [place]  [address]       Ph: [phone]       Fax: [phone]   RxID:   [id number]

## 2010-07-10 NOTE — Assessment & Plan Note (Signed)
Summary: ROV 3 MONTHS///KP   Visit Type:  Follow-up Copy to:  Dr. Nickola Major, Dr. Craige Cotta, Dr. Algie Coffer Primary Provider/Referring Provider:  Newt Lukes MD  CC:  Sarcoid...pt c/o night sweats, trouble sleeping, daytime sleepiness, chest pain, and incr SOB and dry cough x1 month.  History of Present Illness: 40 yo male with pulmonary sarcoid.  Also has joint and neuro involvement.  He has not been on prednisone or methotrexate since October 2011.  He has been getting trouble with medicaid coverage.  As a result he has not been back to see Rheumatology.  He has been getting more pain and swelling in his ankles.  He has more night sweats and chest discomfort.  He has dry cough and chest discomfort.  His symptoms have been getting worse since around Thanksgiving.  He had a recent stress test with cardiology, and was told his heart function was good.   Preventive Screening-Counseling & Management  Alcohol-Tobacco     Smoking Status: quit     Packs/Day: <0.25     Year Started: 1995     Year Quit: 2011  Current Medications (verified): 1)  Xopenex Hfa 45 Mcg/act Aero (Levalbuterol Tartrate) .... Two Puffs Up To Four Times Per Day As Needed 2)  Methotrexate Sol .Marland Kitchen.. 8cc Injection Weekly Not Yet Started Has Not Started 3)  Folic Acid 1 Mg Tabs (Folic Acid) .... One By Mouth Once Daily 4)  Aspirin 325 Mg Tabs (Aspirin) .Marland Kitchen.. 1 By Mouth Once Daily 5)  Metoprolol Tartrate 50 Mg Tabs (Metoprolol Tartrate) .Marland Kitchen.. 1 Two Times A Day 6)  Hydrochlorothiazide 12.5 Mg Tabs (Hydrochlorothiazide) .Marland Kitchen.. 1 By Mouth Daily 7)  Furosemide 40 Mg Tabs (Furosemide) .Marland Kitchen.. 1 By Mouth Two Times A Day As Needed 8)  Klor-Con M20 20 Meq Cr-Tabs (Potassium Chloride Crys Cr) .Marland Kitchen.. 1 By Mouth Two Times A Day (Or As Directed) 9)  Pravastatin Sodium 40 Mg Tabs (Pravastatin Sodium) .Marland Kitchen.. 1 By Mouth Daily 10)  Allopurinol 300 Mg Tabs (Allopurinol) .... Take 1 Tablet By Mouth Once A Day 11)  Ambien 5 Mg Tabs (Zolpidem Tartrate)  .Marland Kitchen.. 1 By Mouth At Bedtime As Needed 12)  Saw Palmetto Plus  Caps (Misc Natural Products) .... Take 3 Tablet By Mouth Once A Day 13)  Depo-Testosterone 200 Mg/ml Oil (Testosterone Cypionate) .... 200mg  Im Every 3-4 Weeks 14)  Metformin Hcl 500 Mg Xr24h-Tab (Metformin Hcl) .... 4 Tabs Each Am 15)  Prodigy Blood Glucose Test  Strp (Glucose Blood) .... Check Blood Sugars Two Times A Day  Dx: 250.00 16)  Prodigy Lancets 26g  Misc (Lancets) .... Use Two Times A Day 17)  Pepcid 40 Mg Tabs (Famotidine) .... One At Bedtime 18)  Diltiazem Hcl 60 Mg Tabs (Diltiazem Hcl) .... Take 1 Four Times A Day 19)  Aciphex 20 Mg  Tbec (Rabeprazole Sodium) .... Take  One 30-60 Min Before First Meal of The Day 20)  Tramadol Hcl 50 Mg Tabs (Tramadol Hcl) .... As Needed For Pain (From Dr. Nickola Major) 21)  Cialis 5 Mg Tabs (Tadalafil) .... Take 1 Tablet By Mouth Prior To Intercourse As Needed 22)  Vicodin 5-500 Mg Tabs (Hydrocodone-Acetaminophen) .Marland Kitchen.. 1 Tab Every 4 Hours As Needed For Severe Pain  Allergies (verified): 1)  ! Sulfa  Past History:  Past Medical History: Reviewed history from 04/10/2010 and no changes required. Gout Hypertension Diabetes mellitus 2 Asthma  Rheumatic fever age 64 Arthritis      - ?juvenile rheumatoid arthritis vs sarcoidosis      -  followed by Dr. Nickola Major with Rheumatology TIA   Peripheral neuropathy Mediastinal lymphadenopathy with biopsy proven sarcoidosis.................Marland KitchenDr. Craige Cotta       - CT chest 07/30/08, 09/20/09       - MTX started 03/01/09  Possible Neurosarcoidis      - Followed by Dr. Craige Cotta with [facility] Narcolepsy without Cataplexy      - MSLT 01/09/09         - MRI brain 01/29/09 ?neuro-sarcoidosis CAD - cath 06/2009 -nonobst dz     -Followed by Dr. Algie Coffer Hyperlipidemia   Past Surgical History: Reviewed history from 12/13/2008 and no changes required. Bronchoscopy 08/21/08 Mediastinoscopy 11/30/08  Social History: Smoking Status:   quit Packs/Day:  <0.25  Vital Signs:  Patient profile:   40 year old male Height:      75 inches (190.50 cm) Weight:      290 pounds (131.82 kg) BMI:     36.38 O2 Sat:      98 % on Room air Temp:     98.6 degrees F (37.00 degrees C) oral Pulse rate:   86 / minute BP sitting:   128 / 90  (right arm) Cuff size:   large  Vitals Entered By: Michel Bickers CMA (June 24, 2010 2:49 PM)  O2 Sat at Rest %:  98 O2 Flow:  Room air CC: Sarcoid...pt c/o night sweats, trouble sleeping, daytime sleepiness,chest pain, incr SOB and dry cough x1 month Is Patient Diabetic? Yes Comments Medications reviewed with patient Michel Bickers CMA  June 24, 2010 2:50 PM   Physical Exam  General:  obese.   Nose:  no deformity, discharge, inflammation, or lesions Mouth:  no exudate Neck:  no JVD.   Lungs:  diminished breath sounds, no wheeze or rales Heart:  regular rate and rhythm, S1, S2 without murmurs, rubs, gallops, or clicks Extremities:  no clubbing, cyanosis, edema, or deformity noted Neurologic:  strength normal.   Cervical Nodes:  no significant adenopathy   Impression & Recommendations:  Problem # 1:  DYSPNEA (ICD-786.05) He has worsening dyspnea since being of methotrexate and prednisone.  Will repeat his chest xray and labs.  Have given him script for prednisone, but advised him to not fill until I discuss his test results with him.  Problem # 2:  PULMONARY SARCOIDOSIS (ICD-135) As above.  Problem # 3:  ARTHRITIS (ICD-716.90) He will arrange for rheumatology follow up once his insurance issues resolve.  Medications Added to Medication List This Visit: 1)  Prednisone 20 Mg Tabs (Prednisone) .... Two pills once daily  Complete Medication List: 1)  Xopenex Hfa 45 Mcg/act Aero (Levalbuterol tartrate) .... Two puffs up to four times per day as needed 2)  Methotrexate Sol  .Marland Kitchen.. 8cc injection weekly not yet started has not started 3)  Folic Acid 1 Mg Tabs (Folic acid) .... One by mouth  once daily 4)  Aspirin 325 Mg Tabs (Aspirin) .Marland Kitchen.. 1 by mouth once daily 5)  Metoprolol Tartrate 50 Mg Tabs (Metoprolol tartrate) .Marland Kitchen.. 1 two times a day 6)  Hydrochlorothiazide 12.5 Mg Tabs (Hydrochlorothiazide) .Marland Kitchen.. 1 by mouth daily 7)  Furosemide 40 Mg Tabs (Furosemide) .Marland Kitchen.. 1 by mouth two times a day as needed 8)  Klor-con M20 20 Meq Cr-tabs (Potassium chloride crys cr) .Marland Kitchen.. 1 by mouth two times a day (or as directed) 9)  Pravastatin Sodium 40 Mg Tabs (Pravastatin sodium) .Marland Kitchen.. 1 by mouth daily 10)  Allopurinol 300 Mg Tabs (Allopurinol) .... Take 1 tablet by mouth once  a day 11)  Ambien 5 Mg Tabs (Zolpidem tartrate) .Marland Kitchen.. 1 by mouth at bedtime as needed 12)  Saw Palmetto Plus Caps (Misc natural products) .... Take 3 tablet by mouth once a day 13)  Depo-testosterone 200 Mg/ml Oil (Testosterone cypionate) .... 200mg  im every 3-4 weeks 14)  Metformin Hcl 500 Mg Xr24h-tab (Metformin hcl) .... 4 tabs each am 15)  Prodigy Blood Glucose Test Strp (Glucose blood) .... Check blood sugars two times a day  dx: 250.00 16)  Prodigy Lancets 26g Misc (Lancets) .... Use two times a day 17)  Pepcid 40 Mg Tabs (Famotidine) .... One at bedtime 18)  Diltiazem Hcl 60 Mg Tabs (Diltiazem hcl) .... Take 1 four times a day 19)  Aciphex 20 Mg Tbec (Rabeprazole sodium) .... Take  one 30-60 min before first meal of the day 20)  Tramadol Hcl 50 Mg Tabs (Tramadol hcl) .... As needed for pain (from dr. Nickola Major) 21)  Cialis 5 Mg Tabs (Tadalafil) .... Take 1 tablet by mouth prior to intercourse as needed 22)  Vicodin 5-500 Mg Tabs (Hydrocodone-acetaminophen) .Marland Kitchen.. 1 tab every 4 hours as needed for severe pain 23)  Prednisone 20 Mg Tabs (Prednisone) .... Two pills once daily  Other Orders: Est. Patient Level III (16109) TLB-BMP (Basic Metabolic Panel-BMET) (80048-METABOL) TLB-CBC Platelet - w/Differential (85025-CBCD) TLB-Hepatic/Liver Function Pnl (80076-HEPATIC) TLB-BNP (B-Natriuretic Peptide)  (83880-BNPR) TLB-Sedimentation Rate (ESR) (85652-ESR) T-2 View CXR (71020TC)  Patient Instructions: 1)  Chest xray and lab test today 2)  Will call with results and discuss plan for prednisone 3)  Follow up in 2 months Prescriptions: PREDNISONE 20 MG TABS (PREDNISONE) two pills once daily  #60 x 1   Entered and Authorized by:   [name] MD   Signed by:   [name] MD on 06/24/2010   Method used:   Print then Give to Patient   RxID:   [id number]

## 2010-07-10 NOTE — Assessment & Plan Note (Signed)
Summary: TEST INJ-LB  Nurse Visit   Allergies: 1)  ! Sulfa  Medication Administration  Injection # 1:    Medication: Testosterone Cypionat 200mg  ing    Diagnosis: TESTICULAR HYPOFUNCTION (ICD-257.2)    Route: IM    Site: LUOQ gluteus    Exp Date: 10/2011    Lot #: [id number]    Mfr: Abraxis    Patient tolerated injection without complications    Given by: Zella Ball Ewing CMA Duncan Dull) (June 24, 2010 2:27 PM)  Orders Added: 1)  Testosterone Cypionat 200mg  ing [J1080] 2)  Admin of Therapeutic Inj  intramuscular or subcutaneous [56213]

## 2010-07-11 NOTE — Letter (Signed)
Summary: [facility] @ [facility]   Imported By: Sherian Rein 08/01/2009 08:46:41  _____________________________________________________________________  External Attachment:    Type:   Image     Comment:   External Document

## 2010-07-16 NOTE — Assessment & Plan Note (Signed)
Summary: 3-4 MTH FU-STC   Vital Signs:  Patient profile:   40 year old male Height:      75 inches (190.50 cm) Weight:      280.8 pounds (127.64 kg) O2 Sat:      97 % on Room air Temp:     98.0 degrees F (36.67 degrees C) oral Pulse rate:   70 / minute BP sitting:   130 / 82  (left arm) Cuff size:   large  Vitals Entered By: Orlan Leavens RMA (July 10, 2010 10:40 AM)  O2 Flow:  Room air CC: 3 month follow-up Is Patient Diabetic? Yes Did you bring your meter with you today? No Pain Assessment Patient in pain? no        Primary Care Provider:  Newt Lukes MD  CC:  3 month follow-up.  History of Present Illness: here for f/u  1) DM2 - prednisone induced - on metformin -check cbgs at home 2 x/day - home cbgs 120s - no pu/pd - no n/v or abd pain - occ seeing endo for same ([facility])  2) sarcoidosis - involves lungs, joints and ?brain/liver - prev maintained on MTX for same but off 03/2010 due to insurance issues, off pred 01/2010, now low dose- followed by pulm and rheum ([facility]), also neuro -  no recent arthritis symptoms   3) CAD - follows with [facility] cards fro same - hosp and cath 06/2009: nonobst dx, med mgmt - no CP since that time - c/o occ end of the day ankle edema - no change in symptoms in recent months, resolves with elevation and furosemide use -s/p stress test 04/24/10 due to cont sob  4) HTN - reports compliance with ongoing medical treatment. changes in medication reviewed (prev on atenolol, changed to metoprolol by pulm 12/03/09 for pulm issues). denies adverse side effects related to current therapy.   5) dyslipidemia - reports compliance with ongoing medical treatment and no changes in medication dose or frequency. denies adverse side effects related to current therapy. no GI or muscle c/o  6) ED with hypogonadism - tried viagra, levitra and cialis in past - prefers cialis at this time - reports no interest as primary cause of problems but denies depression -   also low test level dx 10/2009 - now on IM replacement, but feels low energy in week before next injection  7) low testosterone - see ED above    Clinical Review Panels:  Diabetes Management   HgBA1C:  7.1 (04/10/2010)   Creatinine:  0.7 (06/24/2010)   Last Dilated Eye Exam:  No diaetic retinopathy, no diabetic macular edema, No sarcodosis found in the eyes (10/24/2009)   Last Foot Exam:  yes (04/10/2010)   Last Flu Vaccine:  Fluvax 3+ (03/13/2010)  CBC   WBC:  6.6 (06/24/2010)   RBC:  5.00 (06/24/2010)   Hgb:  13.5 (06/24/2010)   Hct:  39.3 (06/24/2010)   Platelets:  308.0 (06/24/2010)   MCV  78.5 (06/24/2010)   MCHC  34.3 (06/24/2010)   RDW  14.4 (06/24/2010)   PMN:  42.3 (06/24/2010)   Lymphs:  44.5 (06/24/2010)   Monos:  8.3 (06/24/2010)   Eosinophils:  4.2 (06/24/2010)   Basophil:  0.7 (06/24/2010)  Complete Metabolic Panel   Glucose:  128 (06/24/2010)   Sodium:  143 (06/24/2010)   Potassium:  3.4 (06/24/2010)   Chloride:  103 (06/24/2010)   CO2:  31 (06/24/2010)   BUN:  11 (06/24/2010)   Creatinine:  0.7 (06/24/2010)   Albumin:  3.7 (06/24/2010)   Total Protein:  7.3 (06/24/2010)   Calcium:  9.2 (06/24/2010)   Total Bili:  0.4 (06/24/2010)   Alk Phos:  93 (06/24/2010)   SGPT (ALT):  43 (06/24/2010)   SGOT (AST):  31 (06/24/2010)   Current Medications (verified): 1)  Xopenex Hfa 45 Mcg/act Aero (Levalbuterol Tartrate) .... Two Puffs Up To Four Times Per Day As Needed 2)  Methotrexate Sol .Marland Kitchen.. 8cc Injection Weekly Not Yet Started Has Not Started 3)  Folic Acid 1 Mg Tabs (Folic Acid) .... One By Mouth Once Daily 4)  Aspirin 325 Mg Tabs (Aspirin) .Marland Kitchen.. 1 By Mouth Once Daily 5)  Metoprolol Tartrate 50 Mg Tabs (Metoprolol Tartrate) .Marland Kitchen.. 1 Two Times A Day 6)  Hydrochlorothiazide 12.5 Mg Tabs (Hydrochlorothiazide) .Marland Kitchen.. 1 By Mouth Daily 7)  Furosemide 40 Mg Tabs (Furosemide) .Marland Kitchen.. 1 By Mouth Two Times A Day As Needed 8)  Klor-Con M20 20 Meq Cr-Tabs (Potassium Chloride  Crys Cr) .Marland Kitchen.. 1 By Mouth Two Times A Day (Or As Directed) 9)  Pravastatin Sodium 40 Mg Tabs (Pravastatin Sodium) .Marland Kitchen.. 1 By Mouth Daily 10)  Allopurinol 300 Mg Tabs (Allopurinol) .... Take 1 Tablet By Mouth Once A Day 11)  Ambien 5 Mg Tabs (Zolpidem Tartrate) .Marland Kitchen.. 1 By Mouth At Bedtime As Needed 12)  Saw Palmetto Plus  Caps (Misc Natural Products) .... Take 3 Tablet By Mouth Once A Day 13)  Depo-Testosterone 200 Mg/ml Oil (Testosterone Cypionate) .... 200mg  Im Every 3-4 Weeks 14)  Metformin Hcl 500 Mg Xr24h-Tab (Metformin Hcl) .... 4 Tabs Each Am 15)  Prodigy Blood Glucose Test  Strp (Glucose Blood) .... Check Blood Sugars Two Times A Day  Dx: 250.00 16)  Prodigy Lancets 26g  Misc (Lancets) .... Use Two Times A Day 17)  Pepcid 40 Mg Tabs (Famotidine) .... One At Bedtime 18)  Diltiazem Hcl 60 Mg Tabs (Diltiazem Hcl) .... Take 1 Four Times A Day 19)  Aciphex 20 Mg  Tbec (Rabeprazole Sodium) .... Take  One 30-60 Min Before First Meal of The Day 20)  Tramadol Hcl 50 Mg Tabs (Tramadol Hcl) .... As Needed For Pain (From Dr. Nickola Major) 21)  Cialis 5 Mg Tabs (Tadalafil) .... Take 1 Tablet By Mouth Prior To Intercourse As Needed 22)  Vicodin 5-500 Mg Tabs (Hydrocodone-Acetaminophen) .Marland Kitchen.. 1 Tab Every 4 Hours As Needed For Severe Pain 23)  Prednisone 20 Mg Tabs (Prednisone) .... Two Pills Once Daily  Allergies (verified): 1)  ! Sulfa  Past History:  Past Medical History: Gout Hypertension Diabetes mellitus 2 Asthma  Rheumatic fever age 40 Arthritis      - ?juvenile rheumatoid arthritis vs sarcoidosis       - followed by Dr. Nickola Major with Rheumatology TIA   Peripheral neuropathy Mediastinal lymphadenopathy with biopsy proven sarcoidosis.................Marland KitchenDr. Craige Cotta       - CT chest 07/30/08, 09/20/09       - MTX started 03/01/09  Possible Neurosarcoidis      - Followed by Dr. Craige Cotta with [facility] Narcolepsy without Cataplexy      - MSLT 01/09/09         - MRI brain 01/29/09  ?neuro-sarcoidosis CAD - cath 06/2009 -nonobst dz     -Followed by Dr. Algie Coffer Hyperlipidemia   Review of Systems  The patient denies anorexia, weight gain, chest pain, and headaches.    Physical Exam  General:  overweight-appearing.  alert, well-developed, well-nourished, and cooperative to examination.    Lungs:  normal respiratory effort, no intercostal retractions or use of accessory muscles; normal breath sounds bilaterally - no crackles and no wheezes.    Heart:  normal rate, regular rhythm, no murmur, and no rub. BLE with trace edema.  Psych:  Oriented X3, memory intact for recent and remote, normally interactive, good eye contact, not anxious appearing, not depressed appearing, and not agitated.      Impression & Recommendations:  Problem # 1:  DIABETES MELLITUS, TYPE II (ICD-250.00)  His updated medication list for this problem includes:    Aspirin 325 Mg Tabs (Aspirin) .Marland Kitchen... 1 by mouth once daily    Metformin Hcl 500 Mg Xr24h-tab (Metformin hcl) .Marland Kitchen... 2 by mouth two times a day  Orders: TLB-A1C / Hgb A1C (Glycohemoglobin) (83036-A1C)  improving cbgs on reduced pred - check a1c now and defer med changes to endo as needed   Labs Reviewed: Creat: 0.7 (06/24/2010)     Last Eye Exam: No diaetic retinopathy, no diabetic macular edema, No sarcodosis found in the eyes (10/24/2009) Reviewed HgBA1c results: 7.1 (04/10/2010)  7.1 (01/07/2010)  Problem # 2:  TESTICULAR HYPOFUNCTION (ICD-257.2)  Orders: T-Testosterone; Total [phone])  dx 10/2009 - taking shots only q4weeks at this time - likely subtherp - rechcek now and inc freq of injections if low  Problem # 3:  HYPERTENSION (ICD-401.9)  His updated medication list for this problem includes:    Metoprolol Tartrate 50 Mg Tabs (Metoprolol tartrate) .Marland Kitchen... 1 two times a day    Hydrochlorothiazide 12.5 Mg Tabs (Hydrochlorothiazide) .Marland Kitchen... 1 by mouth daily    Furosemide 40 Mg Tabs (Furosemide) .Marland Kitchen... 1 by mouth two times  a day as needed    Diltiazem Hcl 60 Mg Tabs (Diltiazem hcl) .Marland Kitchen... Take 1 four times a day  off BBloc due to dyspnea symptoms - controlled now on current meds - cont same  Prior BP: 140/88 (04/02/2010) BP today: 130/82 Prior BP: 128/90 (06/24/2010)  Labs Reviewed: K+: 3.4 (06/24/2010) Creat: : 0.7 (06/24/2010)   Chol: 173 (06/25/2009)   HDL: 31 (06/25/2009)   LDL: 124 (06/25/2009)   TG: 90 (06/25/2009)  Problem # 4:  HYPERLIPIDEMIA (ICD-272.4)  His updated medication list for this problem includes:    Pravastatin Sodium 40 Mg Tabs (Pravastatin sodium) .Marland Kitchen... 1 by mouth daily  Labs Reviewed: SGOT: 31 (06/24/2010)   SGPT: 43 (06/24/2010)   HDL:31 (06/25/2009)  LDL:124 (06/25/2009)  Chol:173 (06/25/2009)  Trig:90 (06/25/2009)  Complete Medication List: 1)  Xopenex Hfa 45 Mcg/act Aero (Levalbuterol tartrate) .... Two puffs up to four times per day as needed 2)  Folic Acid 1 Mg Tabs (Folic acid) .... One by mouth once daily 3)  Aspirin 325 Mg Tabs (Aspirin) .Marland Kitchen.. 1 by mouth once daily 4)  Metoprolol Tartrate 50 Mg Tabs (Metoprolol tartrate) .Marland Kitchen.. 1 two times a day 5)  Hydrochlorothiazide 12.5 Mg Tabs (Hydrochlorothiazide) .Marland Kitchen.. 1 by mouth daily 6)  Furosemide 40 Mg Tabs (Furosemide) .Marland Kitchen.. 1 by mouth two times a day as needed 7)  Klor-con M20 20 Meq Cr-tabs (Potassium chloride crys cr) .Marland Kitchen.. 1 by mouth two times a day (or as directed) 8)  Pravastatin Sodium 40 Mg Tabs (Pravastatin sodium) .Marland Kitchen.. 1 by mouth daily 9)  Allopurinol 300 Mg Tabs (Allopurinol) .... Take 1 tablet by mouth once a day 10)  Ambien 5 Mg Tabs (Zolpidem tartrate) .Marland Kitchen.. 1 by mouth at bedtime as needed 11)  Saw Palmetto Plus Caps (Misc natural products) .... Take 3 tablet by mouth once a day 12)  Depo-testosterone 200 Mg/ml Oil (Testosterone cypionate) .... 200mg  im every 2 weeks 13)  Metformin Hcl 500 Mg Xr24h-tab (Metformin hcl) .... 2 by mouth two times a day 14)  Prodigy Blood Glucose Test Strp (Glucose blood) .... Check  blood sugars two times a day  dx: 250.00 15)  Prodigy Lancets 26g Misc (Lancets) .... Use two times a day 16)  Pepcid 40 Mg Tabs (Famotidine) .... One at bedtime 17)  Diltiazem Hcl 60 Mg Tabs (Diltiazem hcl) .... Take 1 four times a day 18)  Aciphex 20 Mg Tbec (Rabeprazole sodium) .... Take  one 30-60 min before first meal of the day 19)  Tramadol Hcl 50 Mg Tabs (Tramadol hcl) .... As needed for pain (from dr. Nickola Major) 20)  Cialis 5 Mg Tabs (Tadalafil) .... Take 1 tablet by mouth prior to intercourse as needed 21)  Vicodin 5-500 Mg Tabs (Hydrocodone-acetaminophen) .Marland Kitchen.. 1 tab every 4 hours as needed for severe pain 22)  Prednisone 10 Mg Tabs (Prednisone) .Marland Kitchen.. 1 by mouth once daily  Patient Instructions: 1)  it was good to see you today. 2)  test(s) ordered today - your results will be called to you after review 3)  change timing of metformin to help diarrhea symptoms  4)  Please schedule a follow-up appointment in 3-4 months, call sooner if problems.    Orders Added: 1)  T-Testosterone; Total [phone] 2)  TLB-A1C / Hgb A1C (Glycohemoglobin) [83036-A1C] 3)  Est. Patient Level IV [09811]

## 2010-07-18 ENCOUNTER — Telehealth: Payer: Self-pay | Admitting: Pulmonary Disease

## 2010-07-22 ENCOUNTER — Encounter: Payer: Self-pay | Admitting: Internal Medicine

## 2010-07-22 ENCOUNTER — Ambulatory Visit (INDEPENDENT_AMBULATORY_CARE_PROVIDER_SITE_OTHER): Payer: Medicaid Other

## 2010-07-22 DIAGNOSIS — E291 Testicular hypofunction: Secondary | ICD-10-CM

## 2010-07-24 NOTE — Progress Notes (Signed)
Summary: referal to rheum  Phone Note Call from Patient Call back at Home Phone [phone]   Caller: Patient Summary of Call: Spoke with pt.  He states that VS had refered him to a rheumatologist before, Dr Lendon Colonel, and he had to stop seeing her due to change in his insurance.  Wants to know if VS will make another referral to a different rheumatologist.  Pls advise thanks Initial call taken by: Vernie Murders,  July 18, 2010 8:44 AM  Follow-up for Phone Call        Will send order thru [facility] to arrange for rheumatology consult. Follow-up by: [name] MD,  July 18, 2010 9:28 AM

## 2010-07-28 ENCOUNTER — Encounter: Payer: Self-pay | Admitting: Endocrinology

## 2010-07-28 ENCOUNTER — Ambulatory Visit (INDEPENDENT_AMBULATORY_CARE_PROVIDER_SITE_OTHER): Payer: Medicaid Other | Admitting: Endocrinology

## 2010-07-28 DIAGNOSIS — E119 Type 2 diabetes mellitus without complications: Secondary | ICD-10-CM

## 2010-07-30 NOTE — Assessment & Plan Note (Signed)
Summary: test inj/leschber  Nurse Visit   Vitals Entered By: Orlan Leavens RMA (July 22, 2010 9:28 AM)  Allergies: 1)  ! Sulfa  Medication Administration  Injection # 1:    Medication: Testosterone Cypionat 200mg  ing    Diagnosis: TESTICULAR HYPOFUNCTION (ICD-257.2)    Route: IM    Site: LUOQ gluteus    Exp Date: 03/2013    Lot #: [id number]    Mfr: watson    Patient tolerated injection without complications    Given by: Orlan Leavens RMA (July 22, 2010 9:29 AM)  Orders Added: 1)  Admin of patients own med IM/SQ [phone]

## 2010-08-01 ENCOUNTER — Encounter: Payer: Self-pay | Admitting: Endocrinology

## 2010-08-05 ENCOUNTER — Encounter: Payer: Self-pay | Admitting: Internal Medicine

## 2010-08-05 ENCOUNTER — Ambulatory Visit (INDEPENDENT_AMBULATORY_CARE_PROVIDER_SITE_OTHER): Payer: Medicaid Other

## 2010-08-05 DIAGNOSIS — E291 Testicular hypofunction: Secondary | ICD-10-CM

## 2010-08-05 NOTE — Assessment & Plan Note (Signed)
Summary: f/u appt   Vital Signs:  Patient profile:   40 year old male Height:      75 inches (190.50 cm) Weight:      287.75 pounds (130.80 kg) BMI:     36.10 O2 Sat:      98 % on Room air Temp:     99.2 degrees F (37.33 degrees C) oral Pulse rate:   70 / minute Pulse rhythm:   regular BP sitting:   116 / 78  (left arm) Cuff size:   large  Vitals Entered By: Brenton Grills CMA (AAMA) (July 28, 2010 3:02 PM)  O2 Flow:  Room air CC: Follow up on DM/aj Is Patient Diabetic? Yes   Referring Provider:  Dr. Nickola Major, Dr. Craige Cotta, Dr. Algie Coffer Primary Provider:  Newt Lukes MD  CC:  Follow up on DM/aj.  History of Present Illness: pt states he feels well in general.  he has lost weight, due to his efforts.  no hypoglycemia.    Current Medications (verified): 1)  Xopenex Hfa 45 Mcg/act Aero (Levalbuterol Tartrate) .... Two Puffs Up To Four Times Per Day As Needed 2)  Folic Acid 1 Mg Tabs (Folic Acid) .... One By Mouth Once Daily 3)  Aspirin 325 Mg Tabs (Aspirin) .Marland Kitchen.. 1 By Mouth Once Daily 4)  Metoprolol Tartrate 50 Mg Tabs (Metoprolol Tartrate) .Marland Kitchen.. 1 Two Times A Day 5)  Hydrochlorothiazide 12.5 Mg Tabs (Hydrochlorothiazide) .Marland Kitchen.. 1 By Mouth Daily 6)  Furosemide 40 Mg Tabs (Furosemide) .Marland Kitchen.. 1 By Mouth Two Times A Day As Needed 7)  Klor-Con M20 20 Meq Cr-Tabs (Potassium Chloride Crys Cr) .Marland Kitchen.. 1 By Mouth Two Times A Day (Or As Directed) 8)  Pravastatin Sodium 40 Mg Tabs (Pravastatin Sodium) .Marland Kitchen.. 1 By Mouth Daily 9)  Allopurinol 300 Mg Tabs (Allopurinol) .... Take 1 Tablet By Mouth Once A Day 10)  Ambien 5 Mg Tabs (Zolpidem Tartrate) .Marland Kitchen.. 1 By Mouth At Bedtime As Needed 11)  Saw Palmetto Plus  Caps (Misc Natural Products) .... Take 3 Tablet By Mouth Once A Day 12)  Depo-Testosterone 200 Mg/ml Oil (Testosterone Cypionate) .... 200mg  Im Every 2 Weeks 13)  Metformin Hcl 500 Mg Xr24h-Tab (Metformin Hcl) .... 2 By Mouth Two Times A Day 14)  Prodigy Blood Glucose Test  Strp (Glucose  Blood) .... Check Blood Sugars Two Times A Day  Dx: 250.00 15)  Prodigy Lancets 26g  Misc (Lancets) .... Use Two Times A Day 16)  Pepcid 40 Mg Tabs (Famotidine) .... One At Bedtime 17)  Diltiazem Hcl 60 Mg Tabs (Diltiazem Hcl) .... Take 1 Four Times A Day 18)  Aciphex 20 Mg  Tbec (Rabeprazole Sodium) .... Take  One 30-60 Min Before First Meal of The Day 19)  Tramadol Hcl 50 Mg Tabs (Tramadol Hcl) .... As Needed For Pain (From Dr. Nickola Major) 20)  Cialis 5 Mg Tabs (Tadalafil) .... Take 1 Tablet By Mouth Prior To Intercourse As Needed 21)  Vicodin 5-500 Mg Tabs (Hydrocodone-Acetaminophen) .Marland Kitchen.. 1 Tab Every 4 Hours As Needed For Severe Pain 22)  Prednisone 10 Mg Tabs (Prednisone) .Marland Kitchen.. 1 By Mouth Once Daily  Allergies (verified): 1)  ! 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  Review of Systems  The patient denies chest pain and dyspnea on exertion.    Physical Exam  General:  obese.  no distress Pulses:  dorsalis pedis intact bilat.  Extremities:  no deformity.  no ulcer on the feet.  feet are of normal color and temp. 1+ right pedal edema and trace left pedal edema.   Neurologic:  sensation is intact to touch on the feet    Impression & Recommendations:  Problem # 1:  DIABETES MELLITUS, TYPE II (ICD-250.00) HgbA1C: 7.0 (07/10/2010) needs increased rx, if he can achieve it without hypoglycemia  Medications Added to Medication List This Visit: 1)  Accu-chek Aviva Kit (Blood glucose monitoring suppl) .... As dir 2)  Accu-chek Aviva Strp (Glucose blood) .... Once daily, and lancets 250.00 3)  [name] 100 Mg Tabs (Sitagliptin phosphate) .Marland Kitchen.. 1 tab each am  Other Orders: Est. Patient Level III ([address])  Patient Instructions: 1)  i have [facility] to [facility] for "aviva" blood sugar meter and strips. 2)  check your blood sugar 1 time a day.  vary  the time of day when you check, between before the 3 meals, and at bedtime.  also check if you have symptoms of your blood sugar being too high or too low.  please keep a record of the readings and bring it to your next appointment here.  please call us sooner if you are having low blood sugar episodes. 3)  good diet and exercise habits significanly improve the control of your diabetes.  please let me know if you wish to be referred to a dietician.  high blood sugar is very risky to your health.  you should see an eye doctor every year. 4)  controlling your blood pressure and cholesterol drastically reduces the damage diabetes does to your body.  this also applies to quitting smoking.  please discuss these with your doctor.  you should take an aspirin every day, unless you have been advised by a doctor not to. 5)  add januvia 100 mg each am.  i'll do prior authorization for this.  if it is denied, i'll prescrile you bromocriptine.  6)  Please schedule a follow-up appointment in 3 months. Prescriptions: [name] 100 MG TABS (SITAGLIPTIN PHOSPHATE) 1 tab each am  #30 x 11   Entered and Authorized by:   Minus Breeding MD   Signed by:   Minus Breeding MD on 07/28/2010   Method used:   Electronically to        [facility] (retail)       [address]       [place], [place]  [address]       Ph: [phone]       Fax: [phone]   RxID:   [id number] ACCU-CHEK AVIVA  STRP (GLUCOSE BLOOD) once daily, and lancets 250.00  #30 x 11   Entered and Authorized by:   Minus Breeding MD   Signed by:   Minus Breeding MD on 07/28/2010   Method used:   Electronically to        [facility] (retail)       [address]       [place], [place]  [address]       Ph: [phone]       Fax: [phone]   RxID:   [id number] ACCU-CHEK AVIVA  KIT (BLOOD GLUCOSE MONITORING SUPPL) as dir  #1 x 0   Entered and Authorized by:   Minus Breeding MD   Signed by:   Gregary Signs  Ardeen Garland MD on 07/28/2010   Method used:   [facility]ronically to        [facility] ([facility])       [address]       [place], [place]  [address]       Ph: [phone]       Fax: [phone]   RxID:   [id number]    Orders Added: 1)  Est. Patient Level III [[address]]

## 2010-08-14 NOTE — Letter (Signed)
Summary: [facility]  [facility]   Imported By: Sherian Rein 08/05/2010 11:07:36  _____________________________________________________________________  External Attachment:    Type:   Image     Comment:   External Document

## 2010-08-14 NOTE — Assessment & Plan Note (Signed)
Summary: PER PT 2 WK TEST INJ--VL---STC  Nurse Visit   Allergies: 1)  ! Sulfa  Medication Administration  Injection # 1:    Medication: Testosterone Cypionat 200mg  ing    Diagnosis: TESTICULAR HYPOFUNCTION (ICD-257.2)    Route: IM    Site: RUOQ gluteus    Exp Date: 03/08/2013    Lot #: [id number]    Mfr: Claudette Laws    Patient tolerated injection without complications    Given by: Margaret Pyle, CMA (August 05, 2010 9:34 AM)  Orders Added: 1)  Admin of patients own med IM/SQ [phone]

## 2010-08-19 ENCOUNTER — Encounter: Payer: Self-pay | Admitting: Internal Medicine

## 2010-08-19 ENCOUNTER — Ambulatory Visit (INDEPENDENT_AMBULATORY_CARE_PROVIDER_SITE_OTHER): Payer: Medicaid Other

## 2010-08-19 ENCOUNTER — Ambulatory Visit: Payer: Medicaid Other

## 2010-08-19 DIAGNOSIS — E291 Testicular hypofunction: Secondary | ICD-10-CM

## 2010-08-19 LAB — CBC
HCT: 42.5 % (ref 39.0–52.0)
Hemoglobin: 14.6 g/dL (ref 13.0–17.0)
MCH: 27.3 pg (ref 26.0–34.0)
MCHC: 34.5 g/dL (ref 30.0–36.0)
MCV: 79.3 fL (ref 78.0–100.0)
Platelets: 317 10*3/uL (ref 150–400)
RBC: 5.36 MIL/uL (ref 4.22–5.81)
RDW: 13.6 % (ref 11.5–15.5)
WBC: 6.3 10*3/uL (ref 4.0–10.5)

## 2010-08-19 LAB — POCT I-STAT, CHEM 8
BUN: 8 mg/dL (ref 6–23)
Calcium, Ion: 1.13 mmol/L (ref 1.12–1.32)
Chloride: 99 mEq/L (ref 96–112)
Creatinine, Ser: 0.6 mg/dL (ref 0.4–1.5)
Glucose, Bld: 184 mg/dL — ABNORMAL HIGH (ref 70–99)
HCT: 46 % (ref 39.0–52.0)
Hemoglobin: 15.6 g/dL (ref 13.0–17.0)
Potassium: 3.7 mEq/L (ref 3.5–5.1)
Sodium: 138 mEq/L (ref 135–145)
TCO2: 27 mmol/L (ref 0–100)

## 2010-08-19 LAB — DIFFERENTIAL
Basophils Absolute: 0 10*3/uL (ref 0.0–0.1)
Basophils Relative: 0 % (ref 0–1)
Eosinophils Absolute: 0.2 10*3/uL (ref 0.0–0.7)
Eosinophils Relative: 4 % (ref 0–5)
Lymphocytes Relative: 34 % (ref 12–46)
Lymphs Abs: 2.1 10*3/uL (ref 0.7–4.0)
Monocytes Absolute: 0.6 10*3/uL (ref 0.1–1.0)
Monocytes Relative: 9 % (ref 3–12)
Neutro Abs: 3.4 10*3/uL (ref 1.7–7.7)
Neutrophils Relative %: 53 % (ref 43–77)

## 2010-08-21 ENCOUNTER — Telehealth: Payer: Self-pay | Admitting: Internal Medicine

## 2010-08-21 LAB — FUNGUS CULTURE W SMEAR: Fungal Smear: NONE SEEN

## 2010-08-21 LAB — AFB CULTURE WITH SMEAR (NOT AT ARMC): Acid Fast Smear: NONE SEEN

## 2010-08-21 LAB — EXPECTORATED SPUTUM ASSESSMENT W GRAM STAIN, RFLX TO RESP C

## 2010-08-21 LAB — EXPECTORATED SPUTUM ASSESSMENT W REFEX TO RESP CULTURE

## 2010-08-22 LAB — POCT I-STAT, CHEM 8
BUN: 6 mg/dL (ref 6–23)
BUN: 9 mg/dL (ref 6–23)
Calcium, Ion: 0.94 mmol/L — ABNORMAL LOW (ref 1.12–1.32)
Calcium, Ion: 1.11 mmol/L — ABNORMAL LOW (ref 1.12–1.32)
Chloride: 101 meq/L (ref 96–112)
Chloride: 96 mEq/L (ref 96–112)
Creatinine, Ser: 0.7 mg/dL (ref 0.4–1.5)
Creatinine, Ser: 0.8 mg/dL (ref 0.4–1.5)
Glucose, Bld: 144 mg/dL — ABNORMAL HIGH (ref 70–99)
Glucose, Bld: 147 mg/dL — ABNORMAL HIGH (ref 70–99)
HCT: 44 % (ref 39.0–52.0)
HCT: 51 % (ref 39.0–52.0)
Hemoglobin: 15 g/dL (ref 13.0–17.0)
Hemoglobin: 17.3 g/dL — ABNORMAL HIGH (ref 13.0–17.0)
Potassium: 2.7 mEq/L — CL (ref 3.5–5.1)
Potassium: 3.5 meq/L (ref 3.5–5.1)
Sodium: 136 meq/L (ref 135–145)
Sodium: 141 mEq/L (ref 135–145)
TCO2: 30 mmol/L (ref 0–100)
TCO2: 34 mmol/L (ref 0–100)

## 2010-08-22 LAB — COMPREHENSIVE METABOLIC PANEL
ALT: 42 U/L (ref 0–53)
AST: 29 U/L (ref 0–37)
Albumin: 3.8 g/dL (ref 3.5–5.2)
Alkaline Phosphatase: 85 U/L (ref 39–117)
BUN: 8 mg/dL (ref 6–23)
CO2: 28 mEq/L (ref 19–32)
Calcium: 9.1 mg/dL (ref 8.4–10.5)
Chloride: 97 mEq/L (ref 96–112)
Creatinine, Ser: 0.63 mg/dL (ref 0.4–1.5)
GFR calc Af Amer: >60 mL/min (ref >60)
GFR calc non Af Amer: >60 mL/min (ref >60)
Glucose, Bld: 157 mg/dL — ABNORMAL HIGH (ref 70–99)
Potassium: 2.7 mEq/L — CL (ref 3.5–5.1)
Sodium: 137 mEq/L (ref 135–145)
Total Bilirubin: 1 mg/dL (ref 0.3–1.2)
Total Protein: 7.9 g/dL (ref 6.0–8.3)

## 2010-08-22 LAB — URINE MICROSCOPIC-ADD ON

## 2010-08-22 LAB — URINALYSIS, ROUTINE W REFLEX MICROSCOPIC
Glucose, UA: NEGATIVE mg/dL
Hgb urine dipstick: NEGATIVE
Ketones, ur: NEGATIVE mg/dL
Leukocytes, UA: NEGATIVE
Nitrite: NEGATIVE
Protein, ur: 100 mg/dL — AB
Specific Gravity, Urine: 1.04 — ABNORMAL HIGH (ref 1.005–1.030)
Urobilinogen, UA: 1 mg/dL (ref 0.0–1.0)
pH: 5.5 (ref 5.0–8.0)

## 2010-08-22 LAB — CBC
HCT: 45.4 % (ref 39.0–52.0)
Hemoglobin: 15.6 g/dL (ref 13.0–17.0)
MCH: 27.8 pg (ref 26.0–34.0)
MCHC: 34.3 g/dL (ref 30.0–36.0)
MCV: 81 fL (ref 78.0–100.0)
Platelets: 353 10*3/uL (ref 150–400)
RBC: 5.61 MIL/uL (ref 4.22–5.81)
RDW: 16 % — ABNORMAL HIGH (ref 11.5–15.5)
WBC: 9.8 10*3/uL (ref 4.0–10.5)

## 2010-08-22 LAB — DIFFERENTIAL
Basophils Absolute: 0.1 10*3/uL (ref 0.0–0.1)
Basophils Relative: 1 % (ref 0–1)
Eosinophils Absolute: 0.1 10*3/uL (ref 0.0–0.7)
Eosinophils Relative: 1 % (ref 0–5)
Lymphocytes Relative: 23 % (ref 12–46)
Lymphs Abs: 2.2 10*3/uL (ref 0.7–4.0)
Monocytes Absolute: 0.5 10*3/uL (ref 0.1–1.0)
Monocytes Relative: 5 % (ref 3–12)
Neutro Abs: 6.9 10*3/uL (ref 1.7–7.7)
Neutrophils Relative %: 70 % (ref 43–77)

## 2010-08-22 LAB — LIPASE, BLOOD: Lipase: 23 U/L (ref 11–59)

## 2010-08-24 LAB — CK TOTAL AND CKMB (NOT AT ARMC)
CK, MB: 1.6 ng/mL (ref 0.3–4.0)
CK, MB: 1.6 ng/mL (ref 0.3–4.0)
Relative Index: 1.1 (ref 0.0–2.5)
Relative Index: 1.1 (ref 0.0–2.5)
Total CK: 145 U/L (ref 7–232)
Total CK: 151 U/L (ref 7–232)

## 2010-08-24 LAB — URINALYSIS, ROUTINE W REFLEX MICROSCOPIC
Bilirubin Urine: NEGATIVE
Glucose, UA: NEGATIVE mg/dL
Hgb urine dipstick: NEGATIVE
Nitrite: NEGATIVE
Protein, ur: NEGATIVE mg/dL
Specific Gravity, Urine: 1.033 — ABNORMAL HIGH (ref 1.005–1.030)
Urobilinogen, UA: 1 mg/dL (ref 0.0–1.0)
pH: 6 (ref 5.0–8.0)

## 2010-08-24 LAB — DIFFERENTIAL
Basophils Absolute: 0.1 10*3/uL (ref 0.0–0.1)
Basophils Absolute: 0.1 10*3/uL (ref 0.0–0.1)
Basophils Relative: 1 % (ref 0–1)
Basophils Relative: 1 % (ref 0–1)
Eosinophils Absolute: 0.2 10*3/uL (ref 0.0–0.7)
Eosinophils Absolute: 0.3 10*3/uL (ref 0.0–0.7)
Eosinophils Relative: 3 % (ref 0–5)
Eosinophils Relative: 4 % (ref 0–5)
Lymphocytes Relative: 25 % (ref 12–46)
Lymphocytes Relative: 34 % (ref 12–46)
Lymphs Abs: 2.2 10*3/uL (ref 0.7–4.0)
Lymphs Abs: 2.8 10*3/uL (ref 0.7–4.0)
Monocytes Absolute: 0.6 10*3/uL (ref 0.1–1.0)
Monocytes Absolute: 1 10*3/uL (ref 0.1–1.0)
Monocytes Relative: 9 % (ref 3–12)
Monocytes Relative: 9 % (ref 3–12)
Neutro Abs: 3.4 10*3/uL (ref 1.7–7.7)
Neutro Abs: 6.8 10*3/uL (ref 1.7–7.7)
Neutrophils Relative %: 53 % (ref 43–77)
Neutrophils Relative %: 62 % (ref 43–77)

## 2010-08-24 LAB — CBC
HCT: 37.1 % — ABNORMAL LOW (ref 39.0–52.0)
HCT: 37.5 % — ABNORMAL LOW (ref 39.0–52.0)
HCT: 37.8 % — ABNORMAL LOW (ref 39.0–52.0)
Hemoglobin: 12.6 g/dL — ABNORMAL LOW (ref 13.0–17.0)
Hemoglobin: 12.8 g/dL — ABNORMAL LOW (ref 13.0–17.0)
Hemoglobin: 13.1 g/dL (ref 13.0–17.0)
MCHC: 33.8 g/dL (ref 30.0–36.0)
MCHC: 34.2 g/dL (ref 30.0–36.0)
MCHC: 34.7 g/dL (ref 30.0–36.0)
MCV: 79.3 fL (ref 78.0–100.0)
MCV: 80 fL (ref 78.0–100.0)
MCV: 80.1 fL (ref 78.0–100.0)
Platelets: 294 10*3/uL (ref 150–400)
Platelets: 309 10*3/uL (ref 150–400)
Platelets: 434 10*3/uL — ABNORMAL HIGH (ref 150–400)
RBC: 4.64 MIL/uL (ref 4.22–5.81)
RBC: 4.69 MIL/uL (ref 4.22–5.81)
RBC: 4.76 MIL/uL (ref 4.22–5.81)
RDW: 13.4 % (ref 11.5–15.5)
RDW: 13.9 % (ref 11.5–15.5)
RDW: 14.1 % (ref 11.5–15.5)
WBC: 11 10*3/uL — ABNORMAL HIGH (ref 4.0–10.5)
WBC: 6.5 10*3/uL (ref 4.0–10.5)
WBC: 9.2 10*3/uL (ref 4.0–10.5)

## 2010-08-24 LAB — GLUCOSE, CAPILLARY
Glucose-Capillary: 127 mg/dL — ABNORMAL HIGH (ref 70–99)
Glucose-Capillary: 129 mg/dL — ABNORMAL HIGH (ref 70–99)
Glucose-Capillary: 138 mg/dL — ABNORMAL HIGH (ref 70–99)
Glucose-Capillary: 143 mg/dL — ABNORMAL HIGH (ref 70–99)
Glucose-Capillary: 145 mg/dL — ABNORMAL HIGH (ref 70–99)
Glucose-Capillary: 149 mg/dL — ABNORMAL HIGH (ref 70–99)
Glucose-Capillary: 156 mg/dL — ABNORMAL HIGH (ref 70–99)
Glucose-Capillary: 164 mg/dL — ABNORMAL HIGH (ref 70–99)

## 2010-08-24 LAB — BASIC METABOLIC PANEL
BUN: 10 mg/dL (ref 6–23)
BUN: 9 mg/dL (ref 6–23)
CO2: 27 mEq/L (ref 19–32)
CO2: 28 mEq/L (ref 19–32)
Calcium: 9.2 mg/dL (ref 8.4–10.5)
Calcium: 9.3 mg/dL (ref 8.4–10.5)
Chloride: 101 mEq/L (ref 96–112)
Chloride: 98 mEq/L (ref 96–112)
Creatinine, Ser: 0.64 mg/dL (ref 0.4–1.5)
Creatinine, Ser: 0.7 mg/dL (ref 0.4–1.5)
GFR calc Af Amer: >60 mL/min (ref >60)
GFR calc Af Amer: >60 mL/min (ref >60)
GFR calc non Af Amer: >60 mL/min (ref >60)
GFR calc non Af Amer: >60 mL/min (ref >60)
Glucose, Bld: 143 mg/dL — ABNORMAL HIGH (ref 70–99)
Glucose, Bld: 168 mg/dL — ABNORMAL HIGH (ref 70–99)
Potassium: 3.4 mEq/L — ABNORMAL LOW (ref 3.5–5.1)
Potassium: 3.8 mEq/L (ref 3.5–5.1)
Sodium: 134 mEq/L — ABNORMAL LOW (ref 135–145)
Sodium: 138 mEq/L (ref 135–145)

## 2010-08-24 LAB — LIPID PANEL
Cholesterol: 173 mg/dL (ref 0–200)
HDL: 31 mg/dL — ABNORMAL LOW (ref >39)
LDL Cholesterol: 124 mg/dL — ABNORMAL HIGH (ref 0–99)
Total CHOL/HDL Ratio: 5.6 RATIO
Triglycerides: 90 mg/dL (ref <150)
VLDL: 18 mg/dL (ref 0–40)

## 2010-08-24 LAB — APTT: aPTT: 33 seconds (ref 24–37)

## 2010-08-24 LAB — HEMOGLOBIN A1C
Hgb A1c MFr Bld: 6.5 % — ABNORMAL HIGH (ref 4.6–6.1)
Mean Plasma Glucose: 140 mg/dL

## 2010-08-24 LAB — PROTIME-INR
INR: 0.99 (ref 0.00–1.49)
Prothrombin Time: 13 seconds (ref 11.6–15.2)

## 2010-08-24 LAB — URINE CULTURE
Colony Count: NO GROWTH
Culture: NO GROWTH

## 2010-08-24 LAB — TROPONIN I
Troponin I: 0.01 ng/mL (ref 0.00–0.06)
Troponin I: 0.07 ng/mL — ABNORMAL HIGH (ref 0.00–0.06)

## 2010-08-24 LAB — CARDIAC PANEL(CRET KIN+CKTOT+MB+TROPI)
CK, MB: 1.2 ng/mL (ref 0.3–4.0)
Relative Index: 1 (ref 0.0–2.5)
Total CK: 115 U/L (ref 7–232)
Troponin I: 0.01 ng/mL (ref 0.00–0.06)

## 2010-08-24 LAB — HEPARIN LEVEL (UNFRACTIONATED)
Heparin Unfractionated: 0.23 IU/mL — ABNORMAL LOW (ref 0.30–0.70)
Heparin Unfractionated: <0.1 IU/mL — ABNORMAL LOW (ref 0.30–0.70)

## 2010-08-24 LAB — HEMOCCULT GUIAC POC 1CARD (OFFICE): Fecal Occult Bld: NEGATIVE

## 2010-08-26 NOTE — Assessment & Plan Note (Signed)
Summary: PER PT 2 WK TEST INJ--VL--STC  Nurse Visit   Allergies: 1)  ! Sulfa  Medication Administration  Injection # 1:    Medication: Testosterone Cypionat 200mg  ing    Diagnosis: TESTICULAR HYPOFUNCTION (ICD-257.2)    Route: IM    Site: LUOQ gluteus    Exp Date: 03/08/2013    Lot #: [id number]    Mfr: Claudette Laws    Patient tolerated injection without complications    Given by: Margaret Pyle, CMA (August 19, 2010 2:49 PM)  Orders Added: 1)  Admin of patients own med IM/SQ [phone]

## 2010-08-27 LAB — DIFFERENTIAL
Basophils Absolute: 0.1 10*3/uL (ref 0.0–0.1)
Basophils Relative: 1 % (ref 0–1)
Eosinophils Absolute: 0.3 10*3/uL (ref 0.0–0.7)
Eosinophils Relative: 4 % (ref 0–5)
Lymphocytes Relative: 29 % (ref 12–46)
Lymphs Abs: 2.2 10*3/uL (ref 0.7–4.0)
Monocytes Absolute: 0.8 10*3/uL (ref 0.1–1.0)
Monocytes Relative: 11 % (ref 3–12)
Neutro Abs: 4.1 10*3/uL (ref 1.7–7.7)
Neutrophils Relative %: 55 % (ref 43–77)

## 2010-08-27 LAB — URINALYSIS, ROUTINE W REFLEX MICROSCOPIC
Bilirubin Urine: NEGATIVE
Glucose, UA: NEGATIVE mg/dL
Hgb urine dipstick: NEGATIVE
Ketones, ur: NEGATIVE mg/dL
Nitrite: NEGATIVE
Protein, ur: NEGATIVE mg/dL
Specific Gravity, Urine: 1.01 (ref 1.005–1.030)
Urobilinogen, UA: 1 mg/dL (ref 0.0–1.0)
pH: 6 (ref 5.0–8.0)

## 2010-08-27 LAB — CBC
HCT: 39.4 % (ref 39.0–52.0)
Hemoglobin: 13.2 g/dL (ref 13.0–17.0)
MCHC: 33.5 g/dL (ref 30.0–36.0)
MCV: 79.8 fL (ref 78.0–100.0)
Platelets: 351 10*3/uL (ref 150–400)
RBC: 4.94 MIL/uL (ref 4.22–5.81)
RDW: 14.7 % (ref 11.5–15.5)
WBC: 7.5 10*3/uL (ref 4.0–10.5)

## 2010-08-27 LAB — POCT I-STAT, CHEM 8
BUN: 6 mg/dL (ref 6–23)
Calcium, Ion: 1.09 mmol/L — ABNORMAL LOW (ref 1.12–1.32)
Chloride: 101 mEq/L (ref 96–112)
Creatinine, Ser: 0.7 mg/dL (ref 0.4–1.5)
Glucose, Bld: 143 mg/dL — ABNORMAL HIGH (ref 70–99)
HCT: 40 % (ref 39.0–52.0)
Hemoglobin: 13.6 g/dL (ref 13.0–17.0)
Potassium: 3.2 mEq/L — ABNORMAL LOW (ref 3.5–5.1)
Sodium: 137 mEq/L (ref 135–145)
TCO2: 28 mmol/L (ref 0–100)

## 2010-08-27 LAB — POCT CARDIAC MARKERS
CKMB, poc: 1.2 ng/mL (ref 1.0–8.0)
Myoglobin, poc: 58.7 ng/mL (ref 12–200)
Troponin i, poc: <0.05 ng/mL (ref 0.00–0.09)

## 2010-09-02 ENCOUNTER — Ambulatory Visit (INDEPENDENT_AMBULATORY_CARE_PROVIDER_SITE_OTHER): Payer: Medicaid Other | Admitting: Internal Medicine

## 2010-09-02 DIAGNOSIS — E291 Testicular hypofunction: Secondary | ICD-10-CM

## 2010-09-02 MED ORDER — TESTOSTERONE CYPIONATE 200 MG/ML IM SOLN
200.0000 mg | Freq: Once | INTRAMUSCULAR | Status: AC
  Administered 2010-09-02: 200 mg via INTRAMUSCULAR

## 2010-09-04 NOTE — Progress Notes (Signed)
Summary: Med Clearance  Phone Note Call from Patient Call back at Home Phone [phone]   Caller: Patient Call For: Dr Felicity Coyer Summary of Call: I called pt to set up pre-op eval, he states he saw Dr Felicity Coyer in February and he saw Dr Orpah Cobb in Feb as well.He said he had an Echo at Dr Mylo Red office and maybe a EKG as well,please advise. Initial call taken by: Verdell Face,  August 21, 2010 12:03 PM  Follow-up for Phone Call        will then defer final med clearance to cardiology - he is clear to proceed from my medical stance IF clear with cardiology - please let pt's dentist know same and send any copy of last OV and this phone note - thanks Follow-up by: Newt Lukes MD,  August 25, 2010 8:10 AM  Additional Follow-up for Phone Call Additional follow up Details #1::        last OV, ECHO and labs faxed to Dentist along with phone note. Additional Follow-up by: Margaret Pyle, CMA,  August 25, 2010 9:05 AM

## 2010-09-10 ENCOUNTER — Encounter: Payer: Self-pay | Admitting: Internal Medicine

## 2010-09-11 ENCOUNTER — Encounter (HOSPITAL_COMMUNITY)
Admission: RE | Admit: 2010-09-11 | Discharge: 2010-09-11 | Disposition: A | Discharge status: Home / Self Care | Payer: Medicaid Other | Source: Ambulatory Visit | Attending: Oral Surgery | Admitting: Oral Surgery

## 2010-09-11 LAB — CBC
HCT: 39.1 % (ref 39.0–52.0)
Hemoglobin: 14 g/dL (ref 13.0–17.0)
MCH: 26.8 pg (ref 26.0–34.0)
MCHC: 35.8 g/dL (ref 30.0–36.0)
MCV: 74.8 fL — ABNORMAL LOW (ref 78.0–100.0)
Platelets: 333 10*3/uL (ref 150–400)
RBC: 5.23 MIL/uL (ref 4.22–5.81)
RDW: 14.4 % (ref 11.5–15.5)
WBC: 7.6 10*3/uL (ref 4.0–10.5)

## 2010-09-11 LAB — BASIC METABOLIC PANEL
BUN: 7 mg/dL (ref 6–23)
CO2: 26 mEq/L (ref 19–32)
Calcium: 9.2 mg/dL (ref 8.4–10.5)
Chloride: 101 mEq/L (ref 96–112)
Creatinine, Ser: 0.84 mg/dL (ref 0.4–1.5)
GFR calc Af Amer: >60 mL/min (ref >60)
GFR calc non Af Amer: >60 mL/min (ref >60)
Glucose, Bld: 154 mg/dL — ABNORMAL HIGH (ref 70–99)
Potassium: 3.5 mEq/L (ref 3.5–5.1)
Sodium: 137 mEq/L (ref 135–145)

## 2010-09-11 LAB — SURGICAL PCR SCREEN
MRSA, PCR: NEGATIVE
Staphylococcus aureus: POSITIVE — AB

## 2010-09-12 ENCOUNTER — Ambulatory Visit (HOSPITAL_COMMUNITY)
Admission: RE | Admit: 2010-09-12 | Discharge: 2010-09-12 | Disposition: A | Discharge status: Home / Self Care | Payer: Medicaid Other | Source: Ambulatory Visit | Attending: Oral Surgery | Admitting: Oral Surgery

## 2010-09-12 DIAGNOSIS — E119 Type 2 diabetes mellitus without complications: Secondary | ICD-10-CM | POA: Insufficient documentation

## 2010-09-12 DIAGNOSIS — J449 Chronic obstructive pulmonary disease, unspecified: Secondary | ICD-10-CM | POA: Insufficient documentation

## 2010-09-12 DIAGNOSIS — K006 Disturbances in tooth eruption: Secondary | ICD-10-CM | POA: Insufficient documentation

## 2010-09-12 DIAGNOSIS — I10 Essential (primary) hypertension: Secondary | ICD-10-CM | POA: Insufficient documentation

## 2010-09-12 DIAGNOSIS — J4489 Other specified chronic obstructive pulmonary disease: Secondary | ICD-10-CM | POA: Insufficient documentation

## 2010-09-12 DIAGNOSIS — F430 Acute stress reaction: Secondary | ICD-10-CM | POA: Insufficient documentation

## 2010-09-12 LAB — GLUCOSE, CAPILLARY
Glucose-Capillary: 149 mg/dL — ABNORMAL HIGH (ref 70–99)
Glucose-Capillary: 138 mg/dL — ABNORMAL HIGH (ref 70–99)
Glucose-Capillary: 117 mg/dL — ABNORMAL HIGH (ref 70–99)

## 2010-09-13 LAB — CREATININE, SERUM
Creatinine, Ser: 0.86 mg/dL (ref 0.4–1.5)
GFR calc Af Amer: >60 mL/min (ref >60)
GFR calc non Af Amer: >60 mL/min (ref >60)

## 2010-09-15 ENCOUNTER — Telehealth: Payer: Self-pay

## 2010-09-15 LAB — ABO/RH: ABO/RH(D): O POS

## 2010-09-15 LAB — TISSUE CULTURE
Culture: NO GROWTH
Gram Stain: NONE SEEN

## 2010-09-15 LAB — CBC
HCT: 37.6 % — ABNORMAL LOW (ref 39.0–52.0)
Hemoglobin: 13 g/dL (ref 13.0–17.0)
MCHC: 34.7 g/dL (ref 30.0–36.0)
MCV: 80.5 fL (ref 78.0–100.0)
Platelets: 305 10*3/uL (ref 150–400)
RBC: 4.67 MIL/uL (ref 4.22–5.81)
RDW: 15 % (ref 11.5–15.5)
WBC: 7.9 10*3/uL (ref 4.0–10.5)

## 2010-09-15 LAB — COMPREHENSIVE METABOLIC PANEL
ALT: 21 U/L (ref 0–53)
AST: 19 U/L (ref 0–37)
Albumin: 3.4 g/dL — ABNORMAL LOW (ref 3.5–5.2)
Alkaline Phosphatase: 71 U/L (ref 39–117)
BUN: 6 mg/dL (ref 6–23)
CO2: 25 mEq/L (ref 19–32)
Calcium: 8.8 mg/dL (ref 8.4–10.5)
Chloride: 104 mEq/L (ref 96–112)
Creatinine, Ser: 0.63 mg/dL (ref 0.4–1.5)
GFR calc Af Amer: >60 mL/min (ref >60)
GFR calc non Af Amer: >60 mL/min (ref >60)
Glucose, Bld: 140 mg/dL — ABNORMAL HIGH (ref 70–99)
Potassium: 3.1 mEq/L — ABNORMAL LOW (ref 3.5–5.1)
Sodium: 140 mEq/L (ref 135–145)
Total Bilirubin: 0.4 mg/dL (ref 0.3–1.2)
Total Protein: 7.1 g/dL (ref 6.0–8.3)

## 2010-09-15 LAB — GLUCOSE, CAPILLARY
Glucose-Capillary: 132 mg/dL — ABNORMAL HIGH (ref 70–99)
Glucose-Capillary: 159 mg/dL — ABNORMAL HIGH (ref 70–99)

## 2010-09-15 LAB — TYPE AND SCREEN
ABO/RH(D): O POS
Antibody Screen: NEGATIVE

## 2010-09-15 LAB — AFB CULTURE WITH SMEAR (NOT AT ARMC): Acid Fast Smear: NONE SEEN

## 2010-09-15 LAB — APTT: aPTT: 28 seconds (ref 24–37)

## 2010-09-15 LAB — PROTIME-INR
INR: 0.9 (ref 0.00–1.49)
Prothrombin Time: 12.7 seconds (ref 11.6–15.2)

## 2010-09-15 NOTE — Telephone Encounter (Signed)
Anesthesia can aggravate the prostate - if having any abdominal or pelvic pain or fevers, should go to urg care or ER for eval - otherwise,  Should come in for eval if symptoms persisting more than few days - thanks

## 2010-09-15 NOTE — Telephone Encounter (Signed)
Pt called stating he has noticed a decrease in urination since having 3 teeth extracted on Friday 09/12/2010. Pt states that he has been taking his Lasix as prescribed but was advised that problem may be related to anesthesia used during procedure. Please advise.

## 2010-09-15 NOTE — Op Note (Signed)
[name], [name]             ACCOUNT NO.:  [id number]  MEDICAL RECORD NO.:  [id number]           PATIENT TYPE:  O  LOCATION:  SDSC                         FACILITY:  [facility]  PHYSICIAN:  Georgia Lopes, M.D.  DATE OF BIRTH:  [birth year]  DATE OF PROCEDURE:  09/12/2010 DATE OF DISCHARGE:  09/12/2010                              OPERATIVE REPORT   PREOPERATIVE DIAGNOSIS:  Impacted nonrestorable teeth numbers 1, 17, and 32.  POSTOPERATIVE DIAGNOSIS:  Impacted nonrestorable teeth numbers 1, 17, and 32.  PROCEDURE:  Removal of teeth numbers 1, 17, and 32.  SURGEON:  Georgia Lopes, MD  ASSISTANT:  Luberta Mutter, [facility]  ANESTHESIA:  General oral intubation, Dr. Ivin Booty attending.  INDICATIONS FOR PROCEDURE:  The patient is a 40 year old male with history of non-insulin-dependent diabetes, hypertension, and COPD who was referred to my office by his general dentist for removal of nonrestorable and impacted teeth numbers 1, 17, and 32.  Because of teeth were impacted and because teeth had severe dental phobia, it was recommended that the surgery be performed with intravenous or general anesthesia for airway protection.  The patient was scheduled at [facility] for intubation and because of this extensive past medical history.  PROCEDURE:  The patient was taken to the operating room and placed on the table in supine position.  General anesthesia was administered intravenously and oral endotracheal tube was placed and marked.  The patient was then draped for the procedure.  The posterior pharynx was suctioned with the Yankauer suction and a throat pack was placed.  Then, 2% lidocaine with 1:100,000 epinephrine was infiltrated in the inferior alveolar block on the right and left side and then buccal and palatal infiltration around tooth #1.  Bite block was placed in the right side of the mouth and the left side was operated first.  A #15 blade was used to make a full-thickness  incision distal of tooth #17 and extending anteriorly through the embrasure of teeth numbers 18 and 19.  The periosteum was reflected buccally and the buccal bone was exposed. Then, a Stryker handpiece with crosscut fissure bur was used to remove bone infection tooth #17.  The tooth was removed with difficulty as it appeared to be in close approximation to tooth #18 in the root area and root tip pick was used to remove small fragments of the tooth that remained and the area was curetted, irrigated, and closed with 3-0 chromic.  The bite block was repositioned and a #15 blade was used to make a full-thickness incision around teeth numbers 1 and 32.  The periosteum was reflected buccally with a periosteal elevator and buccal bone was removed with irrigation.  The teeth were then elevated with a 301 elevator and removed from the mouth with a dental forceps.  The sockets were then irrigated, curetted, and closed with 3-0 chromic.  The oral cavity was inspected and irrigated and suctioned, and the throat pack was removed.  The patient was awakened and taken to the recovery room breathing spontaneously, in good condition.  ESTIMATED BLOOD LOSS:  Minimum.  COMPLICATIONS:  None.  SPECIMENS:  None.  Georgia Lopes, M.D.     SMJ/MEDQ  D:  09/12/2010  T:  09/13/2010  Job:  [id number]  Electronically Signed by Ocie Doyne M.D. on 09/15/2010 11:23:39 AM

## 2010-09-16 ENCOUNTER — Ambulatory Visit (INDEPENDENT_AMBULATORY_CARE_PROVIDER_SITE_OTHER): Payer: Medicaid Other

## 2010-09-16 DIAGNOSIS — E291 Testicular hypofunction: Secondary | ICD-10-CM

## 2010-09-16 MED ORDER — TESTOSTERONE CYPIONATE 200 MG/ML IM SOLN
200.0000 mg | Freq: Once | INTRAMUSCULAR | Status: AC
  Administered 2010-09-16: 200 mg via INTRAMUSCULAR

## 2010-09-16 NOTE — Telephone Encounter (Signed)
Pt advised and will monitor sxs and call back for OV if needed. Pt denied any fevers, abd or pelvic pain.

## 2010-09-18 LAB — AFB CULTURE WITH SMEAR (NOT AT ARMC): Acid Fast Smear: NONE SEEN

## 2010-09-18 LAB — COMPREHENSIVE METABOLIC PANEL
ALT: 32 U/L (ref 0–53)
AST: 29 U/L (ref 0–37)
Albumin: 3.4 g/dL — ABNORMAL LOW (ref 3.5–5.2)
Alkaline Phosphatase: 72 U/L (ref 39–117)
BUN: 9 mg/dL (ref 6–23)
CO2: 27 mEq/L (ref 19–32)
Calcium: 9.2 mg/dL (ref 8.4–10.5)
Chloride: 103 mEq/L (ref 96–112)
Creatinine, Ser: 0.79 mg/dL (ref 0.4–1.5)
GFR calc Af Amer: >60 mL/min (ref >60)
GFR calc non Af Amer: >60 mL/min (ref >60)
Glucose, Bld: 122 mg/dL — ABNORMAL HIGH (ref 70–99)
Potassium: 4 mEq/L (ref 3.5–5.1)
Sodium: 138 mEq/L (ref 135–145)
Total Bilirubin: 0.6 mg/dL (ref 0.3–1.2)
Total Protein: 6.8 g/dL (ref 6.0–8.3)

## 2010-09-18 LAB — CBC
HCT: 36.7 % — ABNORMAL LOW (ref 39.0–52.0)
Hemoglobin: 12.8 g/dL — ABNORMAL LOW (ref 13.0–17.0)
MCHC: 35 g/dL (ref 30.0–36.0)
MCV: 77.7 fL — ABNORMAL LOW (ref 78.0–100.0)
Platelets: 287 10*3/uL (ref 150–400)
RBC: 4.72 MIL/uL (ref 4.22–5.81)
RDW: 13.6 % (ref 11.5–15.5)
WBC: 6 10*3/uL (ref 4.0–10.5)

## 2010-09-18 LAB — FUNGUS CULTURE W SMEAR: Fungal Smear: NONE SEEN

## 2010-09-18 LAB — CULTURE, RESPIRATORY W GRAM STAIN

## 2010-09-18 LAB — GLUCOSE, CAPILLARY
Glucose-Capillary: 105 mg/dL — ABNORMAL HIGH (ref 70–99)
Glucose-Capillary: 139 mg/dL — ABNORMAL HIGH (ref 70–99)

## 2010-09-18 LAB — CULTURE, RESPIRATORY: Gram Stain: NONE SEEN

## 2010-09-18 LAB — PROTIME-INR
INR: 1 (ref 0.00–1.49)
Prothrombin Time: 13.6 seconds (ref 11.6–15.2)

## 2010-09-18 LAB — APTT: aPTT: 34 seconds (ref 24–37)

## 2010-09-23 LAB — DIFFERENTIAL
Basophils Absolute: 0.1 10*3/uL (ref 0.0–0.1)
Basophils Relative: 1 % (ref 0–1)
Eosinophils Absolute: 0.2 10*3/uL (ref 0.0–0.7)
Eosinophils Relative: 3 % (ref 0–5)
Lymphocytes Relative: 36 % (ref 12–46)
Lymphs Abs: 2.5 10*3/uL (ref 0.7–4.0)
Monocytes Absolute: 0.6 10*3/uL (ref 0.1–1.0)
Monocytes Relative: 9 % (ref 3–12)
Neutro Abs: 3.5 10*3/uL (ref 1.7–7.7)
Neutrophils Relative %: 51 % (ref 43–77)

## 2010-09-23 LAB — BRAIN NATRIURETIC PEPTIDE: Pro B Natriuretic peptide (BNP): <30 pg/mL (ref 0.0–100.0)

## 2010-09-23 LAB — BASIC METABOLIC PANEL
BUN: 10 mg/dL (ref 6–23)
CO2: 27 mEq/L (ref 19–32)
Calcium: 9.6 mg/dL (ref 8.4–10.5)
Chloride: 101 mEq/L (ref 96–112)
Creatinine, Ser: 0.75 mg/dL (ref 0.4–1.5)
GFR calc Af Amer: >60 mL/min (ref >60)
GFR calc non Af Amer: >60 mL/min (ref >60)
Glucose, Bld: 118 mg/dL — ABNORMAL HIGH (ref 70–99)
Potassium: 3.9 mEq/L (ref 3.5–5.1)
Sodium: 137 mEq/L (ref 135–145)

## 2010-09-23 LAB — POCT CARDIAC MARKERS
CKMB, poc: <1 ng/mL — ABNORMAL LOW (ref 1.0–8.0)
Myoglobin, poc: 72.3 ng/mL (ref 12–200)
Troponin i, poc: <0.05 ng/mL (ref 0.00–0.09)

## 2010-09-23 LAB — CBC
HCT: 40.6 % (ref 39.0–52.0)
Hemoglobin: 14 g/dL (ref 13.0–17.0)
MCHC: 34.5 g/dL (ref 30.0–36.0)
MCV: 79.4 fL (ref 78.0–100.0)
Platelets: 384 10*3/uL (ref 150–400)
RBC: 5.11 MIL/uL (ref 4.22–5.81)
RDW: 14.1 % (ref 11.5–15.5)
WBC: 6.9 10*3/uL (ref 4.0–10.5)

## 2010-09-29 ENCOUNTER — Ambulatory Visit (INDEPENDENT_AMBULATORY_CARE_PROVIDER_SITE_OTHER): Payer: Medicaid Other | Admitting: Pulmonary Disease

## 2010-09-29 ENCOUNTER — Ambulatory Visit (INDEPENDENT_AMBULATORY_CARE_PROVIDER_SITE_OTHER)
Admission: RE | Admit: 2010-09-29 | Discharge: 2010-09-29 | Disposition: A | Discharge status: Home / Self Care | Payer: Medicaid Other | Source: Ambulatory Visit | Attending: Pulmonary Disease | Admitting: Pulmonary Disease

## 2010-09-29 ENCOUNTER — Encounter: Payer: Self-pay | Admitting: Pulmonary Disease

## 2010-09-29 VITALS — BP 140/90 | HR 71 | Temp 97.7°F | Ht 75.0 in | Wt 281.4 lb

## 2010-09-29 DIAGNOSIS — D869 Sarcoidosis, unspecified: Secondary | ICD-10-CM

## 2010-09-29 DIAGNOSIS — M129 Arthropathy, unspecified: Secondary | ICD-10-CM

## 2010-09-29 DIAGNOSIS — I2510 Atherosclerotic heart disease of native coronary artery without angina pectoris: Secondary | ICD-10-CM

## 2010-09-29 DIAGNOSIS — R0602 Shortness of breath: Secondary | ICD-10-CM

## 2010-09-29 MED ORDER — AEROCHAMBER MV MISC: Status: DC

## 2010-09-29 MED ORDER — BECLOMETHASONE DIPROPIONATE 80 MCG/ACT IN AERS: INHALATION_SPRAY | RESPIRATORY_TRACT | Status: DC

## 2010-09-29 NOTE — Patient Instructions (Signed)
Qvar two puffs twice per day, and rinse mouth after using; call if no better after two weeks Follow up in 4 months

## 2010-09-29 NOTE — Assessment & Plan Note (Signed)
I don't think his current symptoms are related to sarcoid worsening.  I think his current symptoms are likely related to worsening asthma.  Will give him a trial of Qvar and continue as needed xopenex.  Will give him a spacer device.  Advised him to call if his symptoms do not improve after few weeks.

## 2010-09-29 NOTE — Assessment & Plan Note (Signed)
He is followed with Dr. Craige Cotta from neurology with [facility] for possible neuro involvement with his sarcoid.  He was told at visit in February that things looked okay.

## 2010-09-29 NOTE — Assessment & Plan Note (Signed)
This is stable.  I don't think he needs specific therapy for this.

## 2010-09-29 NOTE — Assessment & Plan Note (Signed)
He reports have recent stress test with Dr. Algie Coffer which he was told was okay.

## 2010-09-29 NOTE — Assessment & Plan Note (Signed)
He was previously on MTX for this.  He is scheduled for follow up evaluation with rheumatology in [place] in May.

## 2010-09-29 NOTE — Progress Notes (Signed)
Subjective:    Patient ID: [name], male    DOB: 01-17-1971, 40 y.o.   MRN: [id number]  HPI 40 yo male with pulmonary sarcoid. Also has joint and neuro involvement.  More breathing trouble lately, more allergies, more sob with exertion, can't cut grass anymore, wheeze more, cough mostly dry, no blood in sputum, chest tight, stress test okay with kadakia, throat okay, occ joint pain, next rheum appt next month in [place], saw kirby (neuro) in feb and everything okay (had mri).  no pred or mtx since last summer.   Spirometry today: FVC 3.61(71%), FEV1 3.22(79%), FEV1% 89.  Chest xray today: CHEST - 2 VIEW  Comparison: Chest x-ray 06/24/2010 and chest CT 01/07/2010.  Findings: The heart is upper limits of normal in size and stable. No obvious mediastinal or hilar adenopathy. There are mild chronic interstitial lung changes but no acute overlying pulmonary process. No pulmonary edema. No pleural effusion. The bony thorax is intact.  IMPRESSION: Mild stable interstitial lung changes without acute overlying pulmonary process.     Past Medical History  Diagnosis Date  . PULMONARY SARCOIDOSIS 12/13/2008    Mediastinal lymphadenopathy with biospy proven sarcodosis  . DIABETES MELLITUS, TYPE II 10/07/2009  . TESTICULAR HYPOFUNCTION 10/08/2009  . HYPERLIPIDEMIA 10/07/2009  . Gout, unspecified 10/07/2009  . POLYNEUROPATHY 10/17/2009  . HYPERTENSION 10/07/2009  . CORONARY ARTERY DISEASE 10/07/2009  . GERD 01/07/2010  . ERECTILE DYSFUNCTION, ORGANIC 10/07/2009  . ARTHRITIS 07/11/2009  . Edema 10/07/2009  . TIA (transient ischemic attack)   . Asthma   . Arthritis     ? 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 Followed by Dr. Nickola Major  . Narcolepsy without cataplexy     MSLT 01/09/09 & MRI brain 01/09/09     Family History  Problem Relation Age of Onset  . Diabetes Mother   . Diabetes Father   . Heart disease Father      History   Social History  . Marital Status: Married    Spouse  Name: N/A    Number of Children: N/A  . Years of Education: N/A   Occupational History  . Not on file.   Social History Main Topics  . Smoking status: Former Smoker -- 15 years    Types: Cigarettes    Quit date: 07/09/2008  . Smokeless tobacco: Not on file   Comment: Married, lives with wife and 3 kids. Currently student. Prev worked as a Research officer, trade union, Engineer, materials at [employer]  . Alcohol Use: Yes  . Drug Use: No  . Sexually Active: Not on file   Other Topics Concern  . Not on file   Social History Narrative  . No narrative on file     Allergies  Allergen Reactions  . Sulfonamide Derivatives     REACTION: rash and fever     Outpatient Prescriptions Prior to Visit  Medication Sig Dispense Refill  . allopurinol (ZYLOPRIM) 300 MG tablet Take 300 mg by mouth daily.        Marland Kitchen aspirin 325 MG tablet Take 325 mg by mouth daily.        Marland Kitchen diltiazem (CARDIZEM) 60 MG tablet Take 60 mg by mouth 4 (four) times daily.        . famotidine (PEPCID) 40 MG tablet Take 40 mg by mouth daily.        . folic acid (FOLVITE) 1 MG tablet Take 1 mg by mouth daily.        Marland Kitchen glucose blood (ACCU-CHEK ACTIVE STRIPS)  test strip 1 each by Other route as needed. Use as instructed       . levalbuterol (XOPENEX HFA) 45 MCG/ACT inhaler Inhale 2 puffs into the lungs 4 (four) times daily as needed.        . metFORMIN (GLUCOPHAGE-XR) 500 MG 24 hr tablet Take 500 mg by mouth 2 (two) times daily. Take 2 tablets by mouth twice a day       . metoprolol (LOPRESSOR) 50 MG tablet Take 50 mg by mouth 2 (two) times daily.        . potassium chloride SA (K-DUR,KLOR-CON) 20 MEQ tablet Take 20 mEq by mouth 2 (two) times daily.        . pravastatin (PRAVACHOL) 40 MG tablet Take 40 mg by mouth daily.        . RABEprazole (ACIPHEX) 20 MG tablet Take 20 mg by mouth daily.        . saw palmetto 160 MG capsule Take 160 mg by mouth daily. Take 3 capsule once daily       . sitaGLIPtan ([name]) 100 MG tablet Take 100 mg by mouth daily.         . tadalafil (CIALIS) 5 MG tablet Take 5 mg by mouth daily as needed.        . testosterone cypionate (DEPO-TESTOSTERONE) 200 MG/ML injection Inject into the muscle every 14 (fourteen) days.        . traMADol (ULTRAM) 50 MG tablet Take 50 mg by mouth every 6 (six) hours as needed.        . zolpidem (AMBIEN) 5 MG tablet Take 5 mg by mouth at bedtime as needed.             Review of Systems     Objective:   Physical Exam Filed Vitals:   09/29/10 1048 09/29/10 1049  BP:  140/90  Pulse:  71  Temp: 97.7 F (36.5 C)   TempSrc: Oral   Height: 6\' 3"  (1.905 m)   Weight: 281 lb 6.4 oz (127.642 kg)   SpO2:  97%       General: obese.  Nose: no deformity, discharge, inflammation, or lesions  Mouth: no exudate  Neck: no JVD.  Lungs: diminished breath sounds, no wheeze or rales  Heart: regular rate and rhythm, S1, S2 without murmurs, rubs, gallops, or clicks  Extremities: no clubbing, cyanosis, edema, or deformity noted  Neurologic: strength normal.  Cervical Nodes: no significant adenopathy    Assessment & Plan:   DYSPNEA I don't think his current symptoms are related to sarcoid worsening.  I think his current symptoms are likely related to worsening asthma.  Will give him a trial of Qvar and continue as needed xopenex.  Will give him a spacer device.  Advised him to call if his symptoms do not improve after few weeks.  PULMONARY SARCOIDOSIS This is stable.  I don't think he needs specific therapy for this.    ARTHRITIS He was previously on MTX for this.  He is scheduled for follow up evaluation with rheumatology in [place] in May.  CORONARY ARTERY DISEASE He reports have recent stress test with Dr. Algie Coffer which he was told was okay.  Sarcoidosis He is followed with Dr. Craige Cotta from neurology with [facility] for possible neuro involvement with his sarcoid.  He was told at visit in February that things looked okay.      Updated Medication List Outpatient Encounter  Prescriptions as of 09/29/2010  Medication Sig Dispense Refill  .  allopurinol (ZYLOPRIM) 300 MG tablet Take 300 mg by mouth daily.        Marland Kitchen aspirin 325 MG tablet Take 325 mg by mouth daily.        Marland Kitchen diltiazem (CARDIZEM) 60 MG tablet Take 60 mg by mouth 4 (four) times daily.        . famotidine (PEPCID) 40 MG tablet Take 40 mg by mouth daily.        . folic acid (FOLVITE) 1 MG tablet Take 1 mg by mouth daily.        Marland Kitchen glucose blood (ACCU-CHEK ACTIVE STRIPS) test strip 1 each by Other route as needed. Use as instructed       . levalbuterol (XOPENEX HFA) 45 MCG/ACT inhaler Inhale 2 puffs into the lungs 4 (four) times daily as needed.        . metFORMIN (GLUCOPHAGE-XR) 500 MG 24 hr tablet Take 500 mg by mouth 2 (two) times daily. Take 2 tablets by mouth twice a day       . metoprolol (LOPRESSOR) 50 MG tablet Take 50 mg by mouth 2 (two) times daily.        . potassium chloride SA (K-DUR,KLOR-CON) 20 MEQ tablet Take 20 mEq by mouth 2 (two) times daily.        . pravastatin (PRAVACHOL) 40 MG tablet Take 40 mg by mouth daily.        . RABEprazole (ACIPHEX) 20 MG tablet Take 20 mg by mouth daily.        . saw palmetto 160 MG capsule Take 160 mg by mouth daily. Take 3 capsule once daily       . sitaGLIPtan ([name]) 100 MG tablet Take 100 mg by mouth daily.        . tadalafil (CIALIS) 5 MG tablet Take 5 mg by mouth daily as needed.        . testosterone cypionate (DEPO-TESTOSTERONE) 200 MG/ML injection Inject into the muscle every 14 (fourteen) days.        . beclomethasone (QVAR) 80 MCG/ACT inhaler Two puffs twice per day, and rinse mouth after using  1 Inhaler  12  . Spacer/Aero-Holding Chambers (AEROCHAMBER MV) inhaler Use as instructed  1 each  0  . traMADol (ULTRAM) 50 MG tablet Take 50 mg by mouth every 6 (six) hours as needed.        . zolpidem (AMBIEN) 5 MG tablet Take 5 mg by mouth at bedtime as needed.

## 2010-09-30 ENCOUNTER — Ambulatory Visit (INDEPENDENT_AMBULATORY_CARE_PROVIDER_SITE_OTHER): Payer: Medicaid Other

## 2010-09-30 DIAGNOSIS — E291 Testicular hypofunction: Secondary | ICD-10-CM

## 2010-09-30 MED ORDER — TESTOSTERONE CYPIONATE 200 MG/ML IM SOLN
200.0000 mg | Freq: Once | INTRAMUSCULAR | Status: AC
  Administered 2010-09-30: 200 mg via INTRAMUSCULAR

## 2010-10-01 ENCOUNTER — Telehealth: Payer: Self-pay

## 2010-10-01 DIAGNOSIS — D869 Sarcoidosis, unspecified: Secondary | ICD-10-CM

## 2010-10-01 NOTE — Telephone Encounter (Signed)
Pt called requesting referral to Pulmo at [facility],

## 2010-10-01 NOTE — Telephone Encounter (Signed)
Pulmonary

## 2010-10-01 NOTE — Telephone Encounter (Signed)
Will refer as requested.

## 2010-10-01 NOTE — Telephone Encounter (Signed)
What specialty is this person?

## 2010-10-02 NOTE — Telephone Encounter (Signed)
Pt advised via VM, told to expect a call from [facility] with appt info 

## 2010-10-11 ENCOUNTER — Other Ambulatory Visit: Payer: Self-pay | Admitting: Internal Medicine

## 2010-10-15 ENCOUNTER — Telehealth: Payer: Self-pay

## 2010-10-15 NOTE — Telephone Encounter (Signed)
Ok to arrange nurse visit for testosterone injection teaching (family or self admin) - thanks

## 2010-10-15 NOTE — Telephone Encounter (Signed)
Pt called stating that his Insurance company advised that they will no longer pay for OV to Dr Felicity Coyer and he will need to find a new PCP. Pt is concerned about testosterone injections while trying to find new MD. Pt is requesting alternate supplement or possibly teaching so he can give himself injections, please advise.

## 2010-10-16 ENCOUNTER — Ambulatory Visit: Payer: Self-pay | Admitting: Internal Medicine

## 2010-10-17 NOTE — Telephone Encounter (Signed)
Pt advised and stated that his Urologist had agreed to administer injections. Pt has appointment today for injection and will find out for certain if this will be a indefinitely or until new PCP can be found. Pt advised to call back as needed. Pt also states he would like his wife to learn to inject medication but she will not be available for teaching until mid-June when school is on summer vacation. I informed pt that this would be okay as well and to call back to schedule if needed.

## 2010-10-21 NOTE — Procedures (Signed)
NAME:  [name], [name] NO.:  [id number]   MEDICAL RECORD NO.:  [id number]          PATIENT TYPE:  OUT   LOCATION:  SLEEP CENTER                 FACILITY[facility]   PHYSICIAN:  [name], MD        DATE OF BIRTH:  1971-04-20   DATE OF STUDY:  12/20/2008                            NOCTURNAL POLYSOMNOGRAM   REFERRING PHYSICIAN:   REFERRING PHYSICIAN:  [name], MD   INDICATIONS:  [name] is a 40 year old male, who has a history of  diabetes, hypertension, and pulmonary sarcoidosis.  He also has symptoms  of sleep disruption and excessive daytime sleepiness.  He was referred  to the sleep lab for evaluation of hypersomnia with obstructive sleep  apnea.   Height is 63 inches.  Weight is 279 pounds.  BMI is 35.  Neck size is 17  inches.  Epworth score is 20.   MEDICATIONS:  Prednisone, atenolol, hydrochlorothiazide, furosemide,  potassium, Glucophage, allopurinol, Ambien, Saw Palmetto, and  hydrocodone.   SLEEP ARCHITECTURE:  Total recording time was 404 minutes.  Total sleep  time was 291 minutes.  Sleep efficiency was 72%.  Sleep latency was 29  minutes.  REM latency was 30 minutes.  The study was notable for lack of  slow wave sleep and the patient slept in both the supine and non-supine  positions.  Of note is that the patient had frequent episodes of REM  sleep.   RESPIRATORY DATA:  The average respiratory was 18.  Moderate snoring was  noted by the technician.  The overall apnea-hypopnea index was 1.2.  There were 3 central apneic events and 3 obstructive hypopneas.   OXYGEN DATA:  The baseline oxygenation was 98%.  The oxygen saturation  nadir was 91%.   CARDIAC DATA:  The average heart rate was 18 and the rhythm strip showed  normal sinus rhythm with sinus arrhythmia and sinus tachycardia.   MOVEMENT/PARASOMNIA:  The periodic limb movement index was 0.  The  patient had 2 restroom trips.   IMPRESSION:  This study does not show evidence for  sleep disordered  breathing.  The patient did have increase sleep disruption and increased  number of REM sleep episodes.  Clinical correlation would be necessary  to determine  the significance of this.  To further evaluate the patient's  hypersomnia, he may need to undergo a multiple sleep latency test.      [name], MD  Diplomat, American Board of Sleep Medicine  Electronically Signed     VS/MEDQ  D:  12/26/2008 07:57:30  T:  12/26/2008 22:01:16  Job:  [id number]

## 2010-10-21 NOTE — Letter (Signed)
December 04, 2008   [name], MD  [address]  [place], [place] [address]   Re:  JAMERIUS, BOECKMAN             DOB:  1970/11/19   Dear Laurier Nancy,   I saw this patient back in office today.  His mediastinoscopy site was  healing well.  His blood pressure was 151/95, pulse 80, respirations 18,  sats 97%.  His pathology showed necrotizing granulomatous disease, which  is consistent with sarcoid.  The stains were all negative.   I appreciate the opportunity.  I will see him back in 3 weeks to check  on status of his healing.   Ines Bloomer, M.D.  Electronically Signed   DPB/MEDQ  D:  12/04/2008  T:  12/05/2008  Job:  [id number]

## 2010-10-21 NOTE — Letter (Signed)
November 20, 2008   [name], MD  [address]  [place], [place] [address]   Re:  [name], [name]             DOB:  1970/10/12   Dear Laurier Nancy,   I appreciate the opportunity of seeing the patient.  This 40 year old  African American male, who is found to have mediastinal and hilar  adenopathy and underwent EBUS and bronchoscopy. He also has some right  upper lobe nodules.  He is presently on 20 mg of prednisone, switched to  10 mg.  He was placed on 20 mg of prednisone because of the EBUS results  showed a questionable granulomas, but we were not definitive of the  diagnosis and he is now referred for a mediastinoscopy.  He has had some  dyspnea, some fevers and some abdominal discomfort.   His medications include prednisone 20 mg daily, atenolol 100 mg daily,  hydrochlorothiazide 12.5 daily, furosemide 40 mg daily, potassium 10 mEq  twice a day, Glucophage 500 mg 2 twice a day, Colcrys 0.6 daily that is  colchicine, allopurinol 300 mg daily, and Ambien 5 mg p.r.n.  He is  allergic to sulfa.  He has diabetes mellitus type 2, hypertension, and  hypercholesterolemia.   Past medical history is also significant for gout.  He had rheumatoid  fever at age 66 and juvenile rheumatoid arthritis.  Has had some  peripheral neuropathy and history of asthma.   SURGICAL HISTORY:  He has had the bronchoscopy.   SOCIAL HISTORY:  He is married.  He has 3 children.  He smokes a pack of  cigarettes a week.  He does not drink alcohol on a regular basis.   REVIEW OF SYSTEMS:  He has had some fluctuating weight, some loss of  appetite and some fever.  Cardiac:  No chest pain, palpitations,  shortness of breath on exertion.  No atrial arrhythmias.  Pulmonary:  He  has had no hemoptysis.  GI:  Some reflux, dysphagia, and intermittent  diarrhea.  GU:  Frequent urination.  No kidney disease.  Vascular:  He  has had a questionable TIA.  No DVT or claudication.  Neurological:  He  has had headaches.   Musculoskeletal:  He has got joint pain and the  juvenile arthritis.  Psychiatric:  No depression or nervousness.  Eyes:  Recent decrease in his eyesight.  Ear:  No change in his hearing.  Hematological:  No bleeding, clotting disorders, or anemia.   PHYSICAL EXAMINATION:  GENERAL:  He is a well-developed [place] American  male in no acute distress.  VITAL SIGNS:  His blood pressure is 148/98, pulse 94, respirations 20,  and sats were 98%.  HEAD, EYES, EARS, NOSE, AND THROAT:  Unremarkable.  NECK:  Supple without thyromegaly.  There is no supraclavicular or  axillary adenopathy.  CHEST:  Clear to auscultation and percussion.  HEART:  Regular sinus rhythm.  No murmurs.  ABDOMEN:  Soft.  There is no hepatosplenomegaly.  EXTREMITIES:  Pulses are 2+.  There is no clubbing or edema.  NEUROLOGICAL:  He is oriented x3.  Sensory and motor intact.   I think unfortunately we did not get a firm diagnosis of sarcoid,  although it was suggestive in some of the biopsies.  I think we can  proceed with mediastinoscopy and we will tentatively schedule this for  November 29, 2008.  I appreciate the opportunity of seeing the patient.   Sincerely,   Ines Bloomer, M.D.  Electronically Signed   DPB/MEDQ  D:  11/20/2008  T:  11/21/2008  Job:  [id number]

## 2010-10-21 NOTE — Assessment & Plan Note (Signed)
OFFICE VISIT   [name], [name]  DOB:  08-13-1970                                        January 01, 2009  CHART #:  [id number]   The patient came for final followup today.  His mediastinoscopy incision  is well healed.  Blood pressure is 130/92, pulse 75, respirations 18,  sats were 95%.  Lungs are clear to auscultation and percussion.  Plan to  see him back again if he has any future problems.  Dr. Craige Cotta will follow  him for his sarcoidosis.   [name], M.D.  Electronically Signed   DPB/MEDQ  D:  01/01/2009  T:  01/02/2009  Job:  [id number]

## 2010-10-21 NOTE — Op Note (Signed)
[name], Xavier             ACCOUNT NO.:  [id number]   MEDICAL RECORD NO.:  [id number]          PATIENT TYPE:  AMB   LOCATION:  SDS                          FACILITY:  [facility]   PHYSICIAN:  Leslye Peer, MD    DATE OF BIRTH:  10/07/1970   DATE OF PROCEDURE:  08/21/2008  DATE OF DISCHARGE:  08/21/2008                               OPERATIVE REPORT   OPERATOR:  Leslye Peer, MD and Karle Plumber, MD   PROCEDURE:  Fiberoptic bronchoscopy with endobronchial ultrasound  guidance of Wang-needle biopsies and transbronchial biopsies.   INDICATION:  Hilar and mediastinal lymphadenopathy, sedation.  The  procedure was performed under general anesthesia.  In the OR, informed  consent was obtained from the patient.  A signed copy is on his hospital  chart.   PROCEDURE DETAILS:  After informed consent was obtained, the patient was  taken to the operating room and general anesthesia was induced in normal  fashion.  An 8.5 endotracheal tube was placed.  The airways were  inspected first with standard bronchoscope.  The main carina was sharp.  The bilateral mainstem bronchi were normal in appearance.  There was  some slight hyperpigmentation of the mucosa of the right mainstem  bronchus right at the right upper lobe takeoff.  Otherwise, all of the  right-sided airways including the right upper lobe bronchus intermedius.  Right middle lobe and right lower lobe airways were within normal limits  without any endobronchial lesions or abnormal secretions.  Attention was  then turned to the left-sided exam.  The left upper lobe lingular, left  lower lobe bronchi were all inspected.  Again, there were no  endobronchial lesions or secretions noted.  The standard bronchoscope  was withdrawn and the endobronchial ultrasound was introduced via the  endotracheal tube.  Wang needle biopsies were performed under ultrasound  guidance are as follows.  The 10R and 11R Wang needle biopsies were  obtained at the right upper lobe takeoff and in the proximal bronchus  intermedius.  These will be sent for cytology.  A second set of Wang  needle biopsies was performed at the seven lymph nodes in the subcarinal  region, which will also be sent for cytology.  The endobronchial  ultrasound was withdrawn after the Wang needle biopsies were performed.  Standard bronchoscope was reintroduced and transbronchial biopsies were  performed from the left upper lobe to be sent for pathology.  Bronchoalveolar lavage was also performed from the left upper lobe to be  sent for cytology and microbiology.  The patient the tolerated procedure  well.  There was no significant bleeding.  Chest x-ray will be performed  after the case to ensure there is no pneumothorax.  The transbronchial  biopsies were performed under fluoroscopic guidance.   SAMPLES:  1. Wang needle biopsy from station 7.  2. Wang needle biopsy from station 10R, Wang needle biopsy from      station 11R.  3. Transbronchial biopsies from the left upper lobe.  4. Bronchoalveolar lavage from the left upper lobe.   PLANS:  The microbiology and pathology results will be  followed up by  Dr. Craige Cotta who is Mr. Pizzo' primary pulmonologist and who was present  for the case.  Decision regarding possible mediastinoscopy will be made  based on cytology results.      Leslye Peer, MD  Electronically Signed     RSB/MEDQ  D:  08/21/2008  T:  08/21/2008  Job:  [id number]   cc:   Ines Bloomer, M.D.  [name], MD

## 2010-10-21 NOTE — Procedures (Signed)
NAME:  EYTHAN, JAYNE NO.:  [id number]   MEDICAL RECORD NO.:  [id number]          PATIENT TYPE:  OUT   LOCATION:  SLEEP CENTER                 FACILITY:  [facility]   PHYSICIAN:  [name], MD        DATE OF BIRTH:  10/05/1970   DATE OF STUDY:  01/09/2009                          MULTIPLE SLEEP LATENCY TEST   REFERRING PHYSICIAN:  [name], MD   INDICATION FOR STUDY:  Mr. Scallon is a 40 year old male who has a  history of pulmonary sarcoidosis, diabetes, and hypertension.  He also  has symptoms of sleep disruption and excessive daytime sleepiness.  He  had an overnight polysomnogram on December 20, 2008 which showed an apnea-  hypopnea index of 1 and a periodic limb movement index of 0.  He was  also noted to have significant sleep disruption and frequent REM periods  in his overnight polysomnogram.  As a result, there was concern for  possibility of narcolepsy.  The patient also did complain of symptoms of  sleep paralysis and sleep hallucinations, but denied cataplexy.  He is  referred to the sleep lab for a multiple sleep latency test.   EPWORTH SLEEPINESS SCORE:  Epworth score is 20.  Height is 6 feet 3  inches, weight is 279 pounds.  Neck size is 17 inches.   BMI:  35.   MEDICATIONS:  Atenolol, hydrochlorothiazide, furosemide, potassium,  Glucophage, allopurinol, Ambien, saw palmetto, and hydrocodone.   NAP 1:  Lights off at 8:00 a.m., lights on at 8:16 a.m.  Sleep latency  1.5 minutes.  REM latency 3.5 minutes.   NAP 2:  Lights off at 10:00 a.m., lights on at 10:16 a.m.  Sleep latency  1.5 minutes.  REM latency 0.5 minutes.   NAP 3:  Lights off at 12:00 p.m., lights on at 12:18 p.m.  Sleep latency  3 minutes.  REM latency 0.5 minutes.   NAP 4:  Lights off at 1400, lights on at 1416.  Sleep latency 1 minute.  REM latency 0.5 minutes.   NAP 5:  Light off at 1600, lights on at 1615.  Sleep latency 0.5  minutes.  REM latency 0 minute.    MEAN SLEEP  LATENCY:  1.5 minutes.   NUMBER OF REM EPISODES:  Sleep onset REM periods in 5/5 naps.   IMPRESSIONS-RECOMMENDATIONS:  This study shows evidence for significant  daytime sleepiness with 5/5 nap sessions with sleep onset REM.  This  study is highly suggestive of narcolepsy.  Given the patient's history  of  sarcoidosis, this also raises a concern of possible neurosarcoidosis and  further CNS imaging studies may be necessary.      [name], MD  Diplomat, American Board of Sleep Medicine  Electronically Signed     VS/MEDQ  D:  01/24/2009 09:12:15  T:  01/24/2009 10:40:35  Job:  [id number]

## 2010-10-21 NOTE — Op Note (Signed)
[name], [name]             ACCOUNT NO.:  [id number]   MEDICAL RECORD NO.:  [id number]          PATIENT TYPE:  OIB   LOCATION:  2550                         FACILITY:  [facility]   PHYSICIAN:  Ines Bloomer, M.D. DATE OF BIRTH:  08/05/1970   DATE OF PROCEDURE:  11/30/2008  DATE OF DISCHARGE:  11/30/2008                               OPERATIVE REPORT   PREOPERATIVE DIAGNOSIS:  Mediastinal adenopathy.   POSTOPERATIVE DIAGNOSIS:  Mediastinal adenopathy.   OPERATION PERFORMED:  Mediastinoscopy.   SURGEON:  Ines Bloomer, MD   ANESTHESIA:  General anesthesia.   After general anesthesia, anterior neck was prepped and draped in usual  sterile manner.  A transverse incision was made and carried down with  electrocautery through the subcutaneous tissue and fascia.  The  pretracheal fascia was entered, and biopsies of 2R and 4R nodes were  done.  The mediastinoscope was removed.  Strap muscles were closed with  2-0 Vicryl, subcutaneous tissue with 3-0 Vicryl, and Dermabond for the  skin.  The patient was returned recovery room in stable condition.      Ines Bloomer, M.D.  Electronically Signed     DPB/MEDQ  D:  11/30/2008  T:  11/30/2008  Job:  [id number]   cc:   [name], MD

## 2010-10-24 NOTE — Cardiovascular Report (Signed)
[name], [name]                         ACCOUNT NO.:  [id number]   MEDICAL RECORD NO.:  [id number]                   PATIENT TYPE:  INP   LOCATION:  3708                                 FACILITY:  [facility]   PHYSICIAN:  Ricki Rodriguez, M.D.               DATE OF BIRTH:  1971-03-31   DATE OF PROCEDURE:  11/22/2003  DATE OF DISCHARGE:  11/23/2003                              CARDIAC CATHETERIZATION   PROCEDURE:  1. Left heart catheterization.  2. Selective coronary angiography.  3. Left ventricular function study.   INDICATIONS:  This 40 year old black male had typical chest pain within one  minute of treadmill stress test with risk factors of hypertension.   APPROACH:  Right femoral artery using 5 French sheath and catheters.   COMPLICATIONS:  None.   HEMODYNAMIC DATA:  The left ventricular pressure was 116/17 and aortic  pressure was 115/81.   CORONARY ANATOMY:  The left main coronary artery was short and unremarkable.   Left anterior descending coronary artery:  The left anterior descending  coronary artery was essentially unremarkable except for luminal  irregularities in the proximal one-third of the vessel.  It wrapped around  the apex of the heart.  Its diagonal one, two and three vessels were  unremarkable.   Left circumflex coronary artery:  The left circumflex coronary artery was  essentially unremarkable with a very small ramus branch and normal obtuse  marginal branch one and two.   Right coronary artery:  The right coronary artery was dominant and had  longer marginal branch running parallel to the main right coronary artery  and supplying the posterior lateral area.  The posterior descending artery  from right coronary artery was unremarkable.   LEFT VENTRICULOGRAM:  Left ventriculogram showed hypodynamic wall motion  with ejection fraction of 70-75%.   IMPRESSION:  1. Near normal coronaries.  2. Hypertensive heart disease.   RECOMMENDATIONS:  This  patient will be treated medically for now and may  undergo noncardiac chest pain evaluation.                                              Ricki Rodriguez, M.D.   ASK/MEDQ  D:  11/22/2003  T:  11/23/2003  Job:  [id number]

## 2010-10-24 NOTE — Discharge Summary (Signed)
[place]. [facility]  Patient:    [name], [name]                      MRN: [id number] Adm. Date:  [id number] Disc. Date: [id number] Attending:  Pricilla Riffle Dictator:   Tereso Newcomer, P.A. CC:         Marinus Maw, M.D.  [facility]  Dietrich Pates, M.D. [facility]   Discharge Summary  DATE OF BIRTH:  07/17/1970.  DISCHARGE DIAGNOSES: 1. Chest pain, etiology uncertain. 2. Hypertension. 3. History of rheumatic fever at age 60. 4. History of juvenile rheumatoid arthritis. 5. History of tobacco use. 6. History of cocaine use. 7. Family history of premature coronary artery disease.  PROCEDURES:  Cardiac catheterization by Dr. Lewayne Bunting on July 16, 2000 revealing normal coronary arteries, normal LV with EF of 65%.  HISTORY OF PRESENT ILLNESS:  This 40 year old male presented to the emergency room with two weeks of chest pain.  He noted that the pain was a pressure sensation that radiated to his left arm.  Episodes would last anywhere from 15 to 45 minutes.  He noted exertional and nonexertional pain.  He had shortness of breath but no PND, palpitations.  He had been admitted for chest pain in the past.  He had also been admitted to [facility] for questionable pericarditis.  He had a stress echocardiogram in 1997 in [place] that was negative.  ALLERGIES:  SULFA.  PHYSICAL EXAMINATION:  GENERAL:  A male in no acute distress.  VITAL SIGNS:  Blood pressure 133/70, pulse 70.  NECK:  Without JVD or bruits.  LUNGS:  Clear.  CARDIOVASCULAR:  Regular rate and rhythm.  ABDOMEN:  Supple.  No hepatosplenomegaly.  EXTREMITIES:  There are 2+ pulses.  No edema.  LABORATORY DATA:  EKG shows sinus rhythm of 64, T-waves inversion in V1. ST and T-wave changes consistent with a fairly repolarization.  HOSPITAL COURSE:  Given the patients history and multiple risk factors for CAD and his symptoms being somewhat concerning for  angina, he was taken to the catheterization lab for further evaluation.  He underwent cardiac catheterization by Dr. Lewayne Bunting on July 16, 2000. The results of the procedure are noted above.  He tolerated the procedure well and had no immediate complications.  On the morning, July 17, 2000, he was found to be in stable condition.  It was recommended that he follow up with Dr. Marinus Maw for the evaluation of his chest pain.  LABORATORY DATA:  White blood cell count 8200, hemoglobin 13.3, hematocrit 39, platelet count 411,000.  INR 1.0, sodium 136, potassium 3.8, chloride 103, CO2 27, glucose 90, BUN 11, creatinine 0.7, calcium 9.0, total protein 8.  Albumin 3.3, AST 22, ALT 38, alkaline phosphatase 71, total bilirubin 0.4.  CK 174, CK-MB of 1.6, troponin I 0.01.  Chest x-ray shows cardiac enlargement with vascular congestion.  DISCHARGE MEDICATIONS: 1. Uniretic 12.5/7.4 mg q.d. 2. Atenolol 100 mg q.d. 3. Coated aspirin 325 mg q.d.  ACTIVITY:  No driving, heavy lifting until Monday, July 19, 2000.  DIET:  Low fat, low cholesterol.  WOUND CARE:  The patient should call our office for any concerns of groin bleeding or swelling.  FOLLOW-UP:  The patient should follow up with Dr. Marinus Maw in the next one to two weeks.  He should have outpatient iron studies per Dr. Marinus Maw for his low MCV. DD:  08/18/00 TD:  08/18/00 Job: [id number]

## 2010-10-24 NOTE — Discharge Summary (Signed)
[name], [name]                         ACCOUNT NO.:  [id number]   MEDICAL RECORD NO.:  [id number]                   PATIENT TYPE:  INP   LOCATION:  2031                                 FACILITY:  [facility]   PHYSICIAN:  Eric L. August Saucer, M.D.                  DATE OF BIRTH:  07/27/1970   DATE OF ADMISSION:  02/20/2002  DATE OF DISCHARGE:  02/22/2002                                 DISCHARGE SUMMARY   FINAL DIAGNOSES:  1. Chest pain, nonspecific (786.59).  2. Dyskinesia of the esophagus (530.5).  3. Hypertension (401.9).  4. Long-term use of anticoagulants (V58.61).   PROCEDURES:  None.   HISTORY OF PRESENT ILLNESS:  This was the second [facility]  admission for this 40 year old married black male who presented with  substernal chest pain, increasing with change in position.  He has a history  of juvenile rheumatoid arthritis and acute rheumatic fever.  He had a  history of MI in the past associated with cocaine use.  He denies recent  drug use.  He had been feeling well up until the last several days prior to  admission.  With the progression of his symptoms, however, the patient  subsequently came to the emergency room and was admitted thereafter by Dr.  Algie Coffer.   PAST MEDICAL HISTORY AND PHYSICAL EXAMINATION:  Per admission H&P.   HOSPITAL COURSE:  The patient was admitted for further evaluation of chest  pain.  He was placed on telemetry.  Cardiac enzymes were obtained q.8h. x 3  which were negative for acute injury.  He was placed on anticoagulation  protocol with Lovenox.  He was noted also to have low potassium during the  presentation.  This was  replaced.  The patient subsequently underwent a  stress Cardiolite study per Dr. Algie Coffer as well as 2-D echocardiogram, both  of which were normal.   On further questioning, he did complain of some dysphagia with solids and  liquids.  A subsequent esophageal study was performed on 02/22/2002 which  showed some  transitory spasm at the GE junction without definite stenosis or  reflux.  It was felt that some of the patient's pain was most likely  secondary to esophageal spasm most likely aggravated by stress.  No definite  evidence for underlying coronary artery disease.  The patient's medication  was adjusted.  By 02/22/2002, he was feeling considerably better an was felt  to be stable for discharge.   DISCHARGE MEDICATIONS:  1. Tenormin 100 mg p.o. q.a.m.  2. Uniretic 15/25 one q.d.  3. Norvasc 5 mg q.d.  4. Aspirin 81 mg q.d.  5. Nexium 40 mg q.a.m.  6. Levsin sublingual 1 or 2 q.4h. p.r.n. spasm.   DIET:  He is to avoid caffeine.  Maintain a 4 g sodium diet.    ACTIVITY:  As tolerated.   FOLLOW UP:  In our office in  two weeks' time.                                               Eric L. August Saucer, M.D.    ELD/MEDQ  D:  03/22/2002  T:  03/22/2002  Job:  [id number]

## 2010-10-24 NOTE — Discharge Summary (Signed)
NAMEJAKAVION, [name]                         ACCOUNT NO.:  [id number]   MEDICAL RECORD NO.:  [id number]                   PATIENT TYPE:  INP   LOCATION:  3708                                 FACILITY:  [facility]   PHYSICIAN:  Ricki Rodriguez, M.D.               DATE OF BIRTH:  1971-01-27   DATE OF ADMISSION:  11/22/2003  DATE OF DISCHARGE:  11/23/2003                                 DISCHARGE SUMMARY   PRINCIPAL DIAGNOSES:  1. Noncardiac chest pain.  2. Hypertensive heart disease.   PRINCIPAL PROCEDURE:  Left heart catheterization with selective coronary  angiography done by Dr. Orpah Cobb on November 22, 2003.   DISCHARGE MEDICATIONS:  1. Aspirin 81 mg one daily.  2. Norvasc 5 mg one daily.  3. Atenolol 100 mg one daily.  4. Uniretic 15/25 mg one daily.  5. Prilosec over-the-counter one daily.  6. Joyce Gross Dur 10 mEq one daily.   PAIN MANAGEMENT:  Tylenol as directed.   ACTIVITY:  As tolerated after two days of sedentary lifestyle.   DISCHARGE DIET:  Low-fat, low-salt diet.   SPECIAL INSTRUCTIONS:  1. The patient is to notify of right groin pain, swelling or discharge.  2. The patient is to see __________ continues.  3. Follow up by Dr. Orpah Cobb in two weeks.  The patient is to call [phone] for appointment.   CONDITION ON DISCHARGE:  Improved.   HISTORY:  This 40 year old black male presented with substernal chest pain  along with heaviness lasting 5-10 minutes, although, there was no radiation  of the chest pain.  He did admit to shortness of breath with activity.  Has  history of hypertension for 12 years, smoking for some time and family  history of premature coronary artery disease with father having myocardial  infarction at age 59.   PHYSICAL EXAMINATION:  VITAL SIGNS:  Temperature 98, pulse 58, respirations  16, blood pressure 150/100, height 6 feet, weight 290 pounds.  GENERAL APPEARANCE:  The patient was alert and oriented x3.  HEENT:  Normocephalic,  atraumatic with short hair.  Eyes:  [name], pupils  equally reacting to light.  Extraocular muscles intact.  Sclerae are white.  Ears, Nose, Throat:  Oral mucosa pink and moist.  NECK:  No JVD, thyromegaly or bruits.  LUNGS:  Clear bilaterally.  HEART:  Normal S1, S2 with no murmurs, gallops, rubs.  ABDOMEN:  Soft and nontender.  EXTREMITIES:  No cyanosis, clubbing or edema.  CNS:  Grossly intact with bilateral equal grips and the patient is left  handed.   LABORATORY DATA:  Normal hemoglobin, hematocrit, WBC count, platelets count,  electrolytes, BUN, creatinine.  Sugar borderline at 149.  Prothrombin time  normal at 12.6.  Cardiac enzyme showed elevated total CPK, but normal  troponin I and normal CK MB, normal liver enzymes.  Glucose borderline at  102 to 149.  Chest x-ray revealed  cardiomegaly with no active disease.  Upper GI was essentially unremarkable.  EKG revealed sinus rhythm with early  repolarization.  Cardiac catheterization showed near normal coronaries with  hypertensive heart disease.   HOSPITAL COURSE:  The patient was admitted to telemetry unit, and he  underwent cardiac catheterization due to significant chest pain on treadmill  stress test along with EKG changes of ischemia.  He had near normal  coronaries.  However, his left ventriculogram showed ejection fraction of 70-  75%.  This could have some effect on subendocardial perfusion and give rise  to chest pain.  Hence, the patient's medications were adjusted with addition  of Norvasc and beta blocker along with ACE inhibitor.  He also had upper GI  evaluation which was essentially unremarkable.  Hence, he was discharged  home in satisfactory condition with adjustment in his medications and will  be followed by me in 1-2 weeks.                                                Ricki Rodriguez, M.D.    ASK/MEDQ  D:  01/03/2004  T:  01/03/2004  Job:  [id number]

## 2010-10-24 NOTE — Discharge Summary (Signed)
[address] [facility]  Patient:    [name], [name] Visit Number: [id number] MRN: [id number]          Service Type: MED Location: [phone] Attending Physician:  Gwenyth Bender Dictated by:   Lind Guest. August Saucer, M.D. Admit Date:  05/17/2001 Discharge Date: 05/20/2001                             Discharge Summary  FINAL DIAGNOSES:  1. Transient cerebral ischemia, 435.9.  2. Idiopathic peripheral neuropathy, 356.9.  3. Abnormality of gait, 781.2.  4. Hypertension, 401.9.  5. Diabetes mellitus type 2, non-insulin dependent, 250.00.  6. Speech disturbance, 784.5.  7. Unspecified hemiplegia and hemiparesis, 342.90.  8. Acute stress reaction, 308.9.  9. Chest pain, 786.50. 10. Obesity, 278.00.  OPERATIONS AND PROCEDURES:  None.  HISTORY OF PRESENT ILLNESS:  This is the first [facility] admission for this 40 year old married black male who presented to the office with increasing left-sided weakness, slurred speech, and increasing confusion.  He was doing well, until Friday morning, when he became upset over a marital disturbance.  The patient subsequently developed substernal pressure pain with dyspnea, no diaphoresis.  He later noted severe left-sided sharp pain in his head with blurred vision.  His wife noted progressive slurred speech, disorientation, with difficulty ambulating.  Symptoms did not improve over the weekend.  The patient was subsequently seen in the office on December 9.  CT scan of the head was done, which showed no acute hemorrhage.  He was subsequently admitted for further evaluation of the persistent weakness with questionable CVA.  The patient en route to the emergency room experienced another episode of substernal chest pain with diaphoresis.  PAST MEDICAL HISTORY:  Hypertension, allergic rhinitis, and mild obesity.  He has a questionable history of distant transient weakness as a child.  Previous history of chest pain in  December 2001 with a cardiac catheterization, which was negative in February.  HABITS:  Notable for not smoking, no alcohol, and no drugs.  Questionable distant history of cocaine use by hospital records.  SOCIAL HISTORY:  The patient recently remarried approximately two months ago. Two children from a previous married.  ALLERGIES:  SULFA.  MEDICATIONS AT THE TIME OF ADMISSION: 1. Atenolol 100 mg q.d. 2. Uniretic 15/25 mg q.d. 3. Allegra 180 mg q.d.  PHYSICAL EXAMINATION:  GENERAL:  Physical exam on admission revealed him to be a well-developed, well-nourished black male, sluggish, in no acute distress.  VITAL SIGNS:  Blood pressure 149/76, pulse 87, respiratory rate 18, temperature 97.7.  HEENT:  Extraocular muscles were intact except for the left lateral gaze.  He had sluggish movement to the left.  He denied diplopia, however.  NECK:  Supple without enlarged thyroid.  No adenopathy.  LUNGS:  Clear.  CARDIOVASCULAR:  Normal S1, S2.  No S3, S4, murmurs, or rubs.  ABDOMEN:  Without tenderness.  EXTREMITIES:  Negative Homans, no edema.  NEUROLOGIC:  He was lethargic, though oriented x2.  He had decreased rapid alternating movement in the left upper extremity versus the right.  Tongue drifted to the right as well.  Left upper extremity revealed 3/5 strength versus 4/5 in the right upper extremity.  Right lower extremity 4+/5, left lower extremity 3/5.  Absent Babinskis on the right, equivocal on the left.  LABORATORY DATA:  MRI scan:  Preliminary readings negative.  Laboratory data was unrevealing.  EKG:  Normal sinus  rhythm, normal axis, no acute changes.  HOSPITAL COURSE:  The patient was admitted for further evaluation and treatment of new onset of left hemiparesis with a history of acute headaches. Question of a migraine variant versus some other vasospastic disease versus convergent reaction was entertained.  He was admitted to telemetry for close monitoring.  He  was placed on aspirin therapy empirically.  He was also started on low-dose Norvasc, in view of his blood pressure, as well as the possibility of a vasospastic disorder.  An ANA and sed rate were obtained as well.  The patient was seen in consultation by Dr. Ellison Carwin.  It was his initial impression that the patients neurologic findings were real versus organic in nature.  It was difficult to explain the presentation, however.  He subsequently did undergo a CT scan of the C-spine.  This, however, was unremarkable.  Notably, after 24 hours, the patients neurologic status did improve considerably.  He still was noted to have some neck discomfort.  His strength had improved considerably, as well as his speech.  The patient notably stated that he felt better without significant headaches being noted. MRI scan of the cervical spine demonstrated only a bulging disk at C5-6 without evidence for definite nerve encroachment.  The patient was continually monitored with continued medication therapy over the next day.  He continued to make steady improvement.  Laboratory data was unrevealing for underlying connective tissue disease as well.  The issues regarding stress and possible vasospastic disease were discussed with the patient at length.  He notably did have a low potassium, which was corrected. By May 19, 2001, he was feeling considerably better with near-complete resolution of his neurologic symptoms.  It was felt that the patient had a transient vasospastic disorder (RIND) possibly secondary to migraine variant as well.  This may have been aggravated by stress.  No neurologic sequelae could be demonstrated and scans, as noted, were negative.  The patient was subsequently discharged home much improved.  DISCHARGE MEDICATIONS: 1. Norvasc 5 mg p.o. q.a.m. 2. Aspirin 325 mg q.d. 3. Atenolol 50 mg q.d. 4. Nitroglycerin 0.4 mg sublingual as needed for chest pain. 5. K-Dur 20 mEq  b.i.d. 6. Uniretic 1/2 tablet q.d.  DIET:  He has been advised to avoid caffeine and sweets.   FOLLOWUP:  Return to the office in one weeks time for followup. Dictated by:   Lind Guest. August Saucer, M.D. Attending Physician:  Gwenyth Bender DD:  07/06/01 TD:  07/07/01 Job: [id number]

## 2010-10-24 NOTE — Consult Note (Signed)
[name]. [facility]  Patient:    [name], [name] Visit Number: [id number] MRN: [id number]          Service Type: MED Location: [phone] Attending Physician:  Gwenyth Bender Dictated by:   Deanna Artis. Sharene Skeans, M.D. Proc. Date: 05/17/01 Admit Date:  05/17/2001   CC:         Eric L. August Saucer, M.D.   Consultation Report  DATE OF BIRTH:  1970/10/19  CHIEF COMPLAINT:  Left-sided weakness, slurred speech, headache, and neck pain.  HISTORY:  I was asked by Dr. August Saucer to see [name], a 40 year old right-handed married African-American male who has an 11-year history of type 2 diabetes mellitus and hypertension.  The patient had onset of symptoms on Friday morning of sharp pain along the left parietal region of his head.  He developed left-sided weakness, particularly in his leg, stumbling gait, slurred speech, and confusional state where he had difficulty both understanding what was said to him and articulating what he wanted to say.  Simultaneous with this was presence of a substernal pressure and also feeling of paraesthesias distally from his knees down to his feet.  Patient complained of blurred vision.  As a result of this he was brought to [facility] for CT scan of the brain which was negative for acute stroke, hemorrhage, tumor, hydrocephalus, or abscess.  Patient has continued to have symptoms and was brought to the emergency room tonight for assessment and Dr. August Saucer admitted him.  PAST MEDICAL HISTORY:  As described above.  The patient had rheumatic heart disease at age 61 and developed a heart murmur and more recently possibly pericarditis.  Patient has a history of juvenile rheumatoid arthritis.  He was hospitalized at age 45 with the hypertension, type 2 diabetes mellitus, and rheumatic fever were diagnosed.  He was also hospitalized for cardiac catheterization on February 8 and 9 by Dietrich Pates, M.D.  The catheterization was  performed by Dr. Lewayne Bunting which was normal.  I asked the patient if he had had a previous episode like this and he said no but Dr. Alfonso Patten note reflects that he may have had an episode somewhat like this when he was a child.  PAST SURGICAL HISTORY:  None.  REVIEW OF SYSTEMS:  The patient has not had recent intercurrent infection in the head, neck, lungs, GI, GU.  No rash, anemia, bruisability, diabetes, or thyroid disease.  No bleeding dyscrasias.  No head injuries.  No falls. Review of systems is otherwise negative.  FAMILY HISTORY:  The patients maternal grandmother died of complications of a stroke in her 75s.  She had had prior heart disease, diabetes, hypertension, and possible cholesterol problems.  She was not a smoker.  Little is known about fathers side of the family and maternal grandfather.  Patients mother is healthy and his fathers history is unknown.  CURRENT MEDICATIONS: 1. Atenolol 100 mg q.d. 2. Uniretic 15/25 one q.d. 3. Aspirin 325 mg q.d.  ALLERGIES:  SULFA.  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  He has a past history of tobacco use, although he is not currently smoking.  He apparently stopped after his cardiac catheterization in February. He also has a history of use of cocaine which he did not admit to when I asked him about drug use.  He has been married for two months and was talking in front of his wife who may  not know his history.  PHYSICAL EXAMINATION  GENERAL:  The patient is an obese, massive left-handed young man in no acute distress.  VITAL SIGNS:  Blood pressure 149/76, resting pulse 87, respirations 18, temperature 97.7.  HEENT:  Tympanic membranes normal.  Pharynx not inflamed.  NECK:  Supple.  He has a positive Lhermitte sign.  No bruits.  LUNGS:  Clear.  HEART:  No murmurs.  Pulses normal.  ABDOMEN:  Soft, protuberant.  Bowel sounds normal.  EXTREMITIES:  Well formed without edema,  cyanosis, alterations in tone, or tight heel cords.  NEUROLOGIC:  Mental status:  Patient was subdued, but showed no dysphagia, no dyspraxia, and no confusion.  Cranial nerves:  Round, reactive pupils, fundi normal.  Visual fields full to double simultaneous stimuli.  Okay end responses equal.  Symmetric facial strength.  Midline tongue and uvula.  Air conduction greater than bone conduction bilaterally.  Facial shows ______ esthesia to the left face which does not respect the midline but the tuning fork is greater on the right than the left which should not be the case.  Motor examination:  No drift.  In the upper extremities patient is 5/5 bilaterally.  He has fine motor movements that were equal on the left and the right which probably should not be given his left handedness.  Right lower extremity is 5/5.  Left lower extremity 4+/5 hip flexors, psoas, knee extensor, 4/5 knee flexor, 5/5 foot plantar flexor, 4/5 foot dorsiflexors but there is some giveaway component.  Sensation shows a mild peripheral stocking neuropathy.  Patient had proprioception that was no better than chance on the left for the hand and the foot.  He had intact stereoagnosis on the left, however, and vibration to the upper shin on the left.  He appeared to have normal sensation on the right to all modalities.  Cerebellar examination: Finger-to-nose and rapid repetitive movements appeared to be okay bilaterally. His gait showed shuffling of the left foot and limping on it.  Deep tendon reflexes were normal at the knees and 1+ in the left ankle, trace in the right ankle, 1 in the biceps and triceps, trace in the brachioradialis without predominance.  Patient had bilateral flexor plantar responses.  The patients sensation also did not respect the midline, involved his entire body from the head to the toes.  IMPRESSION: 1. Left leg monoparesis. 2. Left hemisensory. 3. Gait disorder secondary to left leg  weakness.  4. Lhermitte sign.  Etiology of this is unclear.  This patient could have a    very small dorsal medullary lesion.  He also could have a cervical spine    lesion causing the Lhermitte sign.  I have reviewed the CT scan and the MRI scan and they appear normal to me. With history of hypertension, diabetes, and obesity, the patient is at risk for stroke, but could also have ______ disease, cervical tumor, a syrinx, or some other lesion.  We can see the cord at the C2 level and it appears to be normal.  PLAN:  MRI of the cervical spine without and with contrast.  Physical therapy consult for gait training.  Patient may need a lumbar puncture under fluoroscopy if his symptoms persist.  I appreciate the opportunity to see him.  If you have questions or I can be of assistance, do not hesitate to contact me. Dictated by:   Deanna Artis. Sharene Skeans, M.D. Attending Physician:  Gwenyth Bender DD:  05/17/01 TD:  05/18/01 Job: [id number]

## 2010-10-27 ENCOUNTER — Encounter: Payer: Self-pay | Admitting: Endocrinology

## 2010-10-27 ENCOUNTER — Ambulatory Visit (INDEPENDENT_AMBULATORY_CARE_PROVIDER_SITE_OTHER): Payer: Medicaid Other | Admitting: Endocrinology

## 2010-10-27 ENCOUNTER — Other Ambulatory Visit (INDEPENDENT_AMBULATORY_CARE_PROVIDER_SITE_OTHER): Payer: Medicaid Other

## 2010-10-27 VITALS — BP 122/78 | HR 69 | Temp 97.4°F | Resp 16 | Wt 275.0 lb

## 2010-10-27 DIAGNOSIS — E119 Type 2 diabetes mellitus without complications: Secondary | ICD-10-CM

## 2010-10-27 LAB — HEMOGLOBIN A1C: Hgb A1c MFr Bld: 6.3 % (ref 4.6–6.5)

## 2010-10-27 NOTE — Progress Notes (Signed)
Subjective:    Patient ID: [name], male    DOB: Jul 07, 1970, 40 y.o.   MRN: [id number]  HPI no cbg record, but states cbg's are well-controlled.  He has been off steroids (for sarcoidosis) since mid-2011.  pt states he feels well in general. Past Medical History  Diagnosis Date  . PULMONARY SARCOIDOSIS 12/13/2008    Mediastinal lymphadenopathy with biospy proven sarcodosis  . DIABETES MELLITUS, TYPE II 10/07/2009  . TESTICULAR HYPOFUNCTION 10/08/2009  . HYPERLIPIDEMIA 10/07/2009  . Gout, unspecified 10/07/2009  . POLYNEUROPATHY 10/17/2009  . HYPERTENSION 10/07/2009  . CORONARY ARTERY DISEASE 10/07/2009  . GERD 01/07/2010  . ERECTILE DYSFUNCTION, ORGANIC 10/07/2009  . ARTHRITIS 07/11/2009  . Edema 10/07/2009  . TIA (transient ischemic attack)   . Asthma   . Arthritis     ? 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 Followed by Dr. Nickola Major  . Narcolepsy without cataplexy     MSLT 01/09/09 & MRI brain 01/09/09    Past Surgical History  Procedure Date  . Bronchoscopy 08/21/08  . Mediastinoscopy 11/30/08    History   Social History  . Marital Status: Married    Spouse Name: N/A    Number of Children: N/A  . Years of Education: N/A   Occupational History  . Not on file.   Social History Main Topics  . Smoking status: Former Smoker -- 15 years    Types: Cigarettes    Quit date: 07/09/2008  . Smokeless tobacco: Not on file   Comment: Married, lives with wife and 3 kids. Currently student. Prev worked as a Research officer, trade union, Engineer, materials at [employer]  . Alcohol Use: Yes  . Drug Use: No  . Sexually Active: Not on file   Other Topics Concern  . Not on file   Social History Narrative  . No narrative on file    Current Outpatient Prescriptions on File Prior to Visit  Medication Sig Dispense Refill  . allopurinol (ZYLOPRIM) 300 MG tablet Take 300 mg by mouth daily.        Marland Kitchen aspirin 325 MG tablet Take 325 mg by mouth daily.        . beclomethasone (QVAR) 80 MCG/ACT inhaler Two puffs twice per  day, and rinse mouth after using  1 Inhaler  12  . diltiazem (CARDIZEM) 60 MG tablet Take 60 mg by mouth 4 (four) times daily.        . famotidine (PEPCID) 40 MG tablet Take 40 mg by mouth daily.        . folic acid (FOLVITE) 1 MG tablet Take 1 mg by mouth daily.        Marland Kitchen glucose blood (ACCU-CHEK ACTIVE STRIPS) test strip 1 each by Other route as needed. Use as instructed       . levalbuterol (XOPENEX HFA) 45 MCG/ACT inhaler Inhale 2 puffs into the lungs 4 (four) times daily as needed.        . metFORMIN (GLUCOPHAGE-XR) 500 MG 24 hr tablet Take 500 mg by mouth 2 (two) times daily. Take 2 tablets by mouth twice a day       . metoprolol (LOPRESSOR) 50 MG tablet Take 50 mg by mouth 2 (two) times daily.        . potassium chloride SA (K-DUR,KLOR-CON) 20 MEQ tablet Take 20 mEq by mouth 2 (two) times daily.        . pravastatin (PRAVACHOL) 40 MG tablet TAKE ONE TABLET BY MOUTH ONE TIME DAILY  30 tablet  5  .  RABEprazole (ACIPHEX) 20 MG tablet Take 20 mg by mouth daily.        . saw palmetto 160 MG capsule Take 160 mg by mouth daily. Take 3 capsule once daily       . sitaGLIPtan ([name]) 100 MG tablet Take 100 mg by mouth daily.        Marland Kitchen Spacer/Aero-Holding Chambers (AEROCHAMBER MV) inhaler Use as instructed  1 each  0  . tadalafil (CIALIS) 5 MG tablet Take 5 mg by mouth daily as needed.        . testosterone cypionate (DEPO-TESTOSTERONE) 200 MG/ML injection Inject into the muscle every 14 (fourteen) days.        . traMADol (ULTRAM) 50 MG tablet Take 50 mg by mouth every 6 (six) hours as needed.        . zolpidem (AMBIEN) 5 MG tablet Take 5 mg by mouth at bedtime as needed.          Allergies  Allergen Reactions  . Sulfonamide Derivatives     REACTION: rash and fever    Family History  Problem Relation Age of Onset  . Diabetes Mother   . Diabetes Father   . Heart disease Father     BP 122/78  Pulse 69  Temp(Src) 97.4 F (36.3 C) (Oral)  Resp 16  Wt 275 lb (124.739 kg)  SpO2  98%    Review of Systems denies hypoglycemia    Objective:   Physical Exam Pulses: dorsalis pedis intact bilat.   Feet: no deformity.  no ulcer on the feet.  feet are of normal color and temp.  no edema Neuro: sensation is intact to touch on the feet     Lab Results  Component Value Date   HGBA1C 6.3 10/27/2010     Assessment & Plan:  Dm, well-controlled

## 2010-10-27 NOTE — Patient Instructions (Addendum)
blood tests are being ordered for you today.  please call [phone] to hear your test results.  You will be prompted to enter the 9-digit "MRN" number that appears at the top left of this page, followed by #.  Then you will hear the message. pending the test results, please continue the same medications for now. good diet and exercise habits significanly improve the control of your diabetes.  please let me know if you wish to be referred to a dietician.  high blood sugar is very risky to your health.  you should see an eye doctor every year. controlling your blood pressure and cholesterol drastically reduces the damage diabetes does to your body.  this also applies to quitting smoking.  please discuss these with your doctor.  you should take an aspirin every day, unless you have been advised by a doctor not to. Please make a follow-up appointment in 4 months. (update: i left message on phone-tree:  rx as we discussed.  Ret 6 mos)

## 2010-11-16 ENCOUNTER — Other Ambulatory Visit: Payer: Self-pay | Admitting: Endocrinology

## 2011-01-14 ENCOUNTER — Encounter: Payer: Self-pay | Admitting: Pulmonary Disease

## 2011-01-14 ENCOUNTER — Telehealth: Payer: Self-pay | Admitting: Pulmonary Disease

## 2011-01-14 ENCOUNTER — Other Ambulatory Visit (INDEPENDENT_AMBULATORY_CARE_PROVIDER_SITE_OTHER): Payer: Medicaid Other

## 2011-01-14 ENCOUNTER — Ambulatory Visit (INDEPENDENT_AMBULATORY_CARE_PROVIDER_SITE_OTHER)
Admission: RE | Admit: 2011-01-14 | Discharge: 2011-01-14 | Disposition: A | Discharge status: Home / Self Care | Payer: Medicaid Other | Source: Ambulatory Visit | Attending: Pulmonary Disease | Admitting: Pulmonary Disease

## 2011-01-14 ENCOUNTER — Ambulatory Visit (INDEPENDENT_AMBULATORY_CARE_PROVIDER_SITE_OTHER): Payer: Medicaid Other | Admitting: Pulmonary Disease

## 2011-01-14 VITALS — BP 116/70 | HR 64 | Temp 98.3°F | Ht 75.0 in | Wt 270.2 lb

## 2011-01-14 DIAGNOSIS — D869 Sarcoidosis, unspecified: Secondary | ICD-10-CM

## 2011-01-14 DIAGNOSIS — R0602 Shortness of breath: Secondary | ICD-10-CM

## 2011-01-14 LAB — COMPREHENSIVE METABOLIC PANEL
ALT: 51 U/L (ref 0–53)
AST: 28 U/L (ref 0–37)
Albumin: 4.3 g/dL (ref 3.5–5.2)
Alkaline Phosphatase: 72 U/L (ref 39–117)
BUN: 12 mg/dL (ref 6–23)
CO2: 30 mEq/L (ref 19–32)
Calcium: 9.7 mg/dL (ref 8.4–10.5)
Chloride: 98 mEq/L (ref 96–112)
Creatinine, Ser: 1 mg/dL (ref 0.4–1.5)
GFR: 112.68 mL/min (ref >60.00)
Glucose, Bld: 101 mg/dL — ABNORMAL HIGH (ref 70–99)
Potassium: 3.6 mEq/L (ref 3.5–5.1)
Sodium: 138 mEq/L (ref 135–145)
Total Bilirubin: 0.8 mg/dL (ref 0.3–1.2)
Total Protein: 8.2 g/dL (ref 6.0–8.3)

## 2011-01-14 LAB — CBC WITH DIFFERENTIAL/PLATELET
Basophils Absolute: 0 10*3/uL (ref 0.0–0.1)
Basophils Relative: 0.6 % (ref 0.0–3.0)
Eosinophils Absolute: 0.4 10*3/uL (ref 0.0–0.7)
Eosinophils Relative: 4.6 % (ref 0.0–5.0)
HCT: 43.8 % (ref 39.0–52.0)
Hemoglobin: 14.7 g/dL (ref 13.0–17.0)
Lymphocytes Relative: 44.3 % (ref 12.0–46.0)
Lymphs Abs: 3.5 10*3/uL (ref 0.7–4.0)
MCHC: 33.6 g/dL (ref 30.0–36.0)
MCV: 80.5 fl (ref 78.0–100.0)
Monocytes Absolute: 0.7 10*3/uL (ref 0.1–1.0)
Monocytes Relative: 9.2 % (ref 3.0–12.0)
Neutro Abs: 3.3 10*3/uL (ref 1.4–7.7)
Neutrophils Relative %: 41.3 % — ABNORMAL LOW (ref 43.0–77.0)
Platelets: 277 10*3/uL (ref 150.0–400.0)
RBC: 5.44 Mil/uL (ref 4.22–5.81)
RDW: 17.1 % — ABNORMAL HIGH (ref 11.5–14.6)
WBC: 8 10*3/uL (ref 4.5–10.5)

## 2011-01-14 NOTE — Progress Notes (Signed)
  Subjective:    Patient ID: [name], male    DOB: 12/23/1970, 40 y.o.   MRN: [id number]  HPI CC: Dr. Craige Cotta, Dr. Lanell Matar  40 yo male with sarcoidosis with pulmonary, neuro, and joint involvement.  He is now followed by rheumatology at [facility] with Dr. Lanell Matar.  He was started back on MTX in June.    Since his last visit in April he has lost 10 lbs.  He has not been trying to lose weight.  He has also been having fever and sweats.  He is not as short of breath as before.  He has been getting a dry cough.  He has been feeling fatigued.  He denies wheeze, or hemoptysis.  He has been getting tightness and soreness in the middle part of his chest intermittently.  This can last up to 30 minutes, and then go away on it's own.  He has been using Qvar, and uses xopenex twice per day.  He is not sure if these are helping.  He was started on anti-seizure medications by neuro due to frequent episodes of blacking out.  Review of Systems     Objective:   Physical Exam BP 116/70  Pulse 64  Temp(Src) 98.3 F (36.8 C) (Oral)  Ht 6\' 3"  (1.905 m)  Wt 270 lb 3.2 oz (122.562 kg)  BMI 33.77 kg/m2  SpO2 98%  General - obese.  HEENT - no sinus tenderness, no nasal discharge, no oral exudate, no LAN Cardiac - s1s2 regular, no murmur Chest - no wheeze/rales/dullness/tenderness  Abd - soft, non-tender Ext - no edema Neuro - normal strength Psych - normal mood/behavior Skin - no rashes  Spirometry 09/29/10>>FEV1 3.22(79%), FEV1% 89. Spirometry 01/14/11>>FEV1 3.15(78%), FEV1% 86.  CHEST - 2 VIEW 01/14/11:  Comparison: 09/29/2010   Findings: The lungs are clear without focal infiltrate, edema, pneumothorax or pleural effusion. Cardiopericardial silhouette is at upper limits of normal for size. Imaged bony structures of the thorax are intact.   IMPRESSION:   Stable exam. Low volumes with some minimal chronic interstitial coarsening. No new or acute findings.  Recent Labs  Basename  01/14/11 1435   WBC 8.0   HCT 43.8   PLT 277.0    Recent Labs  Basename 01/14/11 1435   NA 138   K 3.6   CL 98   CO2 30   BUN 12   CREATININE 1.0   GLUCOSE 101*   AST 28   ALT 51   ALBUMIN 4.3       Assessment & Plan:

## 2011-01-14 NOTE — Patient Instructions (Signed)
Follow up in 4 months 

## 2011-01-14 NOTE — Telephone Encounter (Signed)
Spoke with pt and he is aware labs were normal. Pt verbalized understanding and had no questions

## 2011-01-14 NOTE — Telephone Encounter (Signed)
Lab results 01/14/11 reviewed and unremarkable.  Will have my nurse inform patient that lab tests were normal.  No change to current treatment plan.

## 2011-01-15 NOTE — Assessment & Plan Note (Signed)
He has some clinical improvement in dyspnea since starting Qvar.  Will have him continue his inhaler regimen.

## 2011-01-15 NOTE — Assessment & Plan Note (Signed)
His chest xray, labs, and spirometry today are not revealing for a pulmonary cause to his symptoms.  I will hold off on restarting prednisone.  He does not have clinical or radiographic evidence to suggest a pulmonary infection.  Advised him to follow up with Dr. Lanell Matar for management of his methotrexate.

## 2011-01-26 ENCOUNTER — Emergency Department (HOSPITAL_COMMUNITY): Payer: Medicaid Other

## 2011-01-26 ENCOUNTER — Observation Stay (HOSPITAL_COMMUNITY)
Admission: EM | Admit: 2011-01-26 | Discharge: 2011-01-27 | Disposition: A | Discharge status: Home / Self Care | Payer: Medicaid Other | Attending: Cardiovascular Disease | Admitting: Cardiovascular Disease

## 2011-01-26 DIAGNOSIS — Z87891 Personal history of nicotine dependence: Secondary | ICD-10-CM | POA: Insufficient documentation

## 2011-01-26 DIAGNOSIS — D869 Sarcoidosis, unspecified: Secondary | ICD-10-CM | POA: Insufficient documentation

## 2011-01-26 DIAGNOSIS — E119 Type 2 diabetes mellitus without complications: Secondary | ICD-10-CM | POA: Insufficient documentation

## 2011-01-26 DIAGNOSIS — M069 Rheumatoid arthritis, unspecified: Secondary | ICD-10-CM | POA: Insufficient documentation

## 2011-01-26 DIAGNOSIS — R0602 Shortness of breath: Secondary | ICD-10-CM | POA: Insufficient documentation

## 2011-01-26 DIAGNOSIS — I10 Essential (primary) hypertension: Secondary | ICD-10-CM | POA: Insufficient documentation

## 2011-01-26 DIAGNOSIS — R079 Chest pain, unspecified: Principal | ICD-10-CM | POA: Insufficient documentation

## 2011-01-26 LAB — BASIC METABOLIC PANEL
BUN: 12 mg/dL (ref 6–23)
CO2: 29 mEq/L (ref 19–32)
Calcium: 10 mg/dL (ref 8.4–10.5)
Chloride: 98 mEq/L (ref 96–112)
Creatinine, Ser: 0.82 mg/dL (ref 0.50–1.35)
GFR calc Af Amer: >60 mL/min (ref >60)
GFR calc non Af Amer: >60 mL/min (ref >60)
Glucose, Bld: 126 mg/dL — ABNORMAL HIGH (ref 70–99)
Potassium: 3.1 mEq/L — ABNORMAL LOW (ref 3.5–5.1)
Sodium: 138 mEq/L (ref 135–145)

## 2011-01-26 LAB — POCT I-STAT TROPONIN I
Troponin i, poc: 0.02 ng/mL (ref 0.00–0.08)
Troponin i, poc: 0.01 ng/mL (ref 0.00–0.08)

## 2011-01-26 LAB — CBC
HCT: 40.4 % (ref 39.0–52.0)
Hemoglobin: 14.2 g/dL (ref 13.0–17.0)
MCH: 32 pg (ref 26.0–34.0)
MCHC: 35.1 g/dL (ref 30.0–36.0)
MCV: 91 fL (ref 78.0–100.0)
Platelets: 262 10*3/uL (ref 150–400)
RBC: 4.44 MIL/uL (ref 4.22–5.81)
RDW: 13.1 % (ref 11.5–15.5)
WBC: 6.5 10*3/uL (ref 4.0–10.5)

## 2011-01-26 LAB — DIFFERENTIAL
Basophils Absolute: 0 10*3/uL (ref 0.0–0.1)
Basophils Relative: 1 % (ref 0–1)
Eosinophils Absolute: 0.1 10*3/uL (ref 0.0–0.7)
Eosinophils Relative: 2 % (ref 0–5)
Lymphocytes Relative: 49 % — ABNORMAL HIGH (ref 12–46)
Lymphs Abs: 3.1 10*3/uL (ref 0.7–4.0)
Monocytes Absolute: 0.4 10*3/uL (ref 0.1–1.0)
Monocytes Relative: 7 % (ref 3–12)
Neutro Abs: 2.7 10*3/uL (ref 1.7–7.7)
Neutrophils Relative %: 42 % — ABNORMAL LOW (ref 43–77)

## 2011-01-26 LAB — CARDIAC PANEL(CRET KIN+CKTOT+MB+TROPI)
CK, MB: 2.5 ng/mL (ref 0.3–4.0)
CK, MB: 2.2 ng/mL (ref 0.3–4.0)
Relative Index: 1.2 (ref 0.0–2.5)
Relative Index: 1.2 (ref 0.0–2.5)
Total CK: 209 U/L (ref 7–232)
Total CK: 184 U/L (ref 7–232)
Troponin I: <0.3 ng/mL (ref <0.30)
Troponin I: <0.3 ng/mL (ref <0.30)

## 2011-01-26 LAB — LIPID PANEL
Cholesterol: 144 mg/dL (ref 0–200)
HDL: 30 mg/dL — ABNORMAL LOW (ref >39)
LDL Cholesterol: 96 mg/dL (ref 0–99)
Total CHOL/HDL Ratio: 4.8 RATIO
Triglycerides: 90 mg/dL (ref <150)
VLDL: 18 mg/dL (ref 0–40)

## 2011-01-26 LAB — GLUCOSE, CAPILLARY
Glucose-Capillary: 107 mg/dL — ABNORMAL HIGH (ref 70–99)
Glucose-Capillary: 109 mg/dL — ABNORMAL HIGH (ref 70–99)
Glucose-Capillary: 97 mg/dL (ref 70–99)

## 2011-01-26 LAB — CK TOTAL AND CKMB (NOT AT ARMC)
CK, MB: 2.8 ng/mL (ref 0.3–4.0)
Relative Index: 1.1 (ref 0.0–2.5)
Total CK: 265 U/L — ABNORMAL HIGH (ref 7–232)

## 2011-01-26 LAB — HEMOGLOBIN A1C
Hgb A1c MFr Bld: 6 % — ABNORMAL HIGH (ref <5.7)
Mean Plasma Glucose: 126 mg/dL — ABNORMAL HIGH (ref <117)

## 2011-01-26 LAB — HEPARIN LEVEL (UNFRACTIONATED): Heparin Unfractionated: 0.39 IU/mL (ref 0.30–0.70)

## 2011-01-27 ENCOUNTER — Observation Stay (HOSPITAL_COMMUNITY): Payer: Medicaid Other

## 2011-01-27 LAB — CBC
HCT: 38 % — ABNORMAL LOW (ref 39.0–52.0)
Hemoglobin: 13 g/dL (ref 13.0–17.0)
MCH: 25.8 pg — ABNORMAL LOW (ref 26.0–34.0)
MCHC: 34.2 g/dL (ref 30.0–36.0)
MCV: 75.5 fL — ABNORMAL LOW (ref 78.0–100.0)
Platelets: 256 10*3/uL (ref 150–400)
RBC: 5.03 MIL/uL (ref 4.22–5.81)
RDW: 15.2 % (ref 11.5–15.5)
WBC: 7.1 10*3/uL (ref 4.0–10.5)

## 2011-01-27 LAB — GLUCOSE, CAPILLARY
Glucose-Capillary: 107 mg/dL — ABNORMAL HIGH (ref 70–99)
Glucose-Capillary: 109 mg/dL — ABNORMAL HIGH (ref 70–99)
Glucose-Capillary: 115 mg/dL — ABNORMAL HIGH (ref 70–99)
Glucose-Capillary: 118 mg/dL — ABNORMAL HIGH (ref 70–99)

## 2011-01-27 LAB — HEPARIN LEVEL (UNFRACTIONATED): Heparin Unfractionated: 0.33 IU/mL (ref 0.30–0.70)

## 2011-01-27 MED ORDER — TECHNETIUM TC 99M TETROFOSMIN IV KIT
30.0000 | PACK | Freq: Once | INTRAVENOUS | Status: AC | PRN
  Administered 2011-01-27: 30 via INTRAVENOUS

## 2011-01-27 MED ORDER — TECHNETIUM TC 99M TETROFOSMIN IV KIT
10.0000 | PACK | Freq: Once | INTRAVENOUS | Status: AC | PRN
  Administered 2011-01-27: 10 via INTRAVENOUS

## 2011-01-28 NOTE — Discharge Summary (Signed)
  NAMEALOYSIOUS, VANGIESON NO.:  [id number]  MEDICAL RECORD NO.:  [id number]  LOCATION:  2029                         FACILITY:  [facility]  PHYSICIAN:  Ricki Rodriguez, M.D.  DATE OF BIRTH:  11/21/1970  DATE OF ADMISSION:  01/26/2011 DATE OF DISCHARGE:  01/27/2011                              DISCHARGE SUMMARY   HOSPITAL LOCATION:  2029, bed 1.  FINAL DIAGNOSES: 1. Chest pain. 2. Diabetes mellitus type 2. 3. Hypertension. 4. Sarcoidosis. 5. Rheumatoid arthritis. 6. Remote history of tobacco use disorder.  DISCHARGE MEDICATIONS: 1. Aspirin 325 mg one daily. 2. Allopurinol 300 mg one daily. 3. Colchicine 0.6 mg one twice daily. 4. Diltiazem 60 mg two tablets twice daily. 5. Lasix 40 mg daily. 6. Gabapentin 300 mg three times daily. 7. Hydrochlorothiazide 25 mg daily. 8. [name] 100 mg daily. 9. Keppra 500 mg two tablets daily. 10.Metformin 500 mg two twice daily. 11.Metoprolol tartrate 50 mg one twice daily.12.Methotrexate 2.5 mg two tablets on Friday. 13.Nitroglycerin sublingual 0.4 mg one under the tongue every 5     minutes as needed up to three doses as needed. 14.Problem 40 mg one daily. 15.Potassium chloride 20 mEq one twice daily. 16.QVAR 80 mcg two puffs inhalation daily. 17.Saw palmetto 300 mg three capsules daily.  DISCHARGE DIET:  Low-sodium, heart-healthy diet.  DISCHARGE ACTIVITY:  The patient to increase activity as tolerated.  Follow up by Dr. Orpah Cobb in 1 week.  The patient to call [phone] for appointment.  HISTORY:  This is a 40 year old black male presented with substernal chest pain radiating to his left arm improving with sublingual nitroglycerin use.  The patient denied any fever or cough, some of the chest pain increased with deep breathing.  PHYSICAL EXAMINATION:  VITAL SIGNS:  Temperature 98, pulse 65, respirations 20, blood pressure 134/67. GENERAL:  The patient is a well-built, well-nourished male in no  acute distress. HEENT:  The patient is normocephalic, atraumatic with eyes brown. Conjunctivae are pink.  Sclerae nonicteric. NECK:  No JVD. LUNGS:  Clear bilaterally. HEART:  Normal S1 and S2. ABDOMEN:  Soft and nontender, but distended. EXTREMITIES:  No edema, cyanosis, or clubbing. SKIN:  Warm and dry. NEUROLOGIC:  The patient moves all four extremities.  LABORATORY DATA:  Normal hemoglobin, hematocrit, WBC count, and platelet count.  Near normal electrolytes, BUN, creatinine.  CK 265.  Chest x- ray; mild bronchial thickening.    Nuclear stress test; no reversible ischemia.  Ejection fraction of 52%.    EKG; sinus rhythm, left atrial enlargement, left axis deviation, left  anterior hemiblock, and early repolarization.  HOSPITAL COURSE:  The patient was admitted to telemetry unit in observation.  His cardiac enzymes were normal.  He underwent nuclear stress test that failed to show reversible ischemia.  He was then discharged home in satisfactory condition with a follow up by me in 1 week.  The patient was advised to use pain medication as needed for  non-cardiac chest pain.     Ricki Rodriguez, M.D.     ASK/MEDQ  D:  01/27/2011  T:  01/28/2011  Job:  [id number]  Electronically Signed by Orpah Cobb M.D. on 01/28/2011 05:14:09 AM

## 2011-02-23 ENCOUNTER — Encounter: Payer: Self-pay | Admitting: Endocrinology

## 2011-02-23 ENCOUNTER — Ambulatory Visit (INDEPENDENT_AMBULATORY_CARE_PROVIDER_SITE_OTHER): Payer: Medicaid Other | Admitting: Endocrinology

## 2011-02-23 DIAGNOSIS — E119 Type 2 diabetes mellitus without complications: Secondary | ICD-10-CM

## 2011-02-23 NOTE — Progress Notes (Signed)
Subjective:    Patient ID: [name], male    DOB: 03-27-1971, 40 y.o.   MRN: [id number]  HPI Pt returns for f/u of dm.  He reduce the metformin to 500 mg bid, due to nausea.  Otherwise pt states he feels well in general. Past Medical History  Diagnosis Date  . PULMONARY SARCOIDOSIS 12/13/2008    Mediastinal lymphadenopathy with biospy proven sarcodosis  . DIABETES MELLITUS, TYPE II 10/07/2009  . TESTICULAR HYPOFUNCTION 10/08/2009  . HYPERLIPIDEMIA 10/07/2009  . Gout, unspecified 10/07/2009  . POLYNEUROPATHY 10/17/2009  . HYPERTENSION 10/07/2009  . CORONARY ARTERY DISEASE 10/07/2009  . GERD 01/07/2010  . ERECTILE DYSFUNCTION, ORGANIC 10/07/2009  . ARTHRITIS 07/11/2009  . Edema 10/07/2009  . TIA (transient ischemic attack)   . Asthma   . Arthritis     ? 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 Followed by Dr. Nickola Major  . Narcolepsy without cataplexy     MSLT 01/09/09 & MRI brain 01/09/09    Past Surgical History  Procedure Date  . Bronchoscopy 08/21/08  . Mediastinoscopy 11/30/08    History   Social History  . Marital Status: Married    Spouse Name: N/A    Number of Children: N/A  . Years of Education: N/A   Occupational History  . Not on file.   Social History Main Topics  . Smoking status: Former Smoker -- 15 years    Types: Cigarettes    Quit date: 07/09/2008  . Smokeless tobacco: Not on file   Comment: Married, lives with wife and 3 kids. Currently student. Prev worked as a Research officer, trade union, Engineer, materials at [employer]  . Alcohol Use: Yes  . Drug Use: No  . Sexually Active: Not on file   Other Topics Concern  . Not on file   Social History Narrative  . No narrative on file    Current Outpatient Prescriptions on File Prior to Visit  Medication Sig Dispense Refill  . allopurinol (ZYLOPRIM) 300 MG tablet Take 300 mg by mouth daily.        Marland Kitchen aspirin 325 MG tablet Take 325 mg by mouth daily.        . beclomethasone (QVAR) 80 MCG/ACT inhaler Two puffs twice per day, and rinse mouth  after using  1 Inhaler  12  . diltiazem (CARDIZEM) 60 MG tablet Take 60 mg by mouth 4 (four) times daily.        . famotidine (PEPCID) 40 MG tablet Take 40 mg by mouth daily.        . folic acid (FOLVITE) 1 MG tablet Take 1 mg by mouth daily.        Marland Kitchen gabapentin (NEURONTIN) 300 MG capsule Take 300 mg by mouth 3 (three) times daily.        Marland Kitchen glucose blood (ACCU-CHEK ACTIVE STRIPS) test strip 1 each by Other route as needed. Use as instructed       . levalbuterol (XOPENEX HFA) 45 MCG/ACT inhaler Inhale 2 puffs into the lungs 4 (four) times daily as needed.        . levETIRAcetam (KEPPRA) 500 MG tablet 2 tablets at bedtime       . metFORMIN (GLUCOPHAGE-XR) 500 MG 24 hr tablet 2 tablets by mouth two times a day  120 tablet  11  . methotrexate (RHEUMATREX) 2.5 MG tablet Take 12.5 mg by mouth once a week. Caution:Chemotherapy. Protect from light.       . metoprolol (LOPRESSOR) 50 MG tablet Take 50 mg by mouth 2 (  two) times daily.        . potassium chloride SA (K-DUR,KLOR-CON) 20 MEQ tablet Take 20 mEq by mouth 2 (two) times daily.        . pravastatin (PRAVACHOL) 40 MG tablet TAKE ONE TABLET BY MOUTH ONE TIME DAILY  30 tablet  5  . RABEprazole (ACIPHEX) 20 MG tablet Take 20 mg by mouth daily.        . saw palmetto 160 MG capsule Take 160 mg by mouth daily. Take 3 capsule once daily       . sitaGLIPtan ([name]) 100 MG tablet Take 100 mg by mouth daily.        Marland Kitchen Spacer/Aero-Holding Chambers (AEROCHAMBER MV) inhaler Use as instructed  1 each  0  . tadalafil (CIALIS) 5 MG tablet Take 5 mg by mouth daily as needed.        . testosterone cypionate (DEPO-TESTOSTERONE) 200 MG/ML injection Inject into the muscle every 14 (fourteen) days.         Allergies  Allergen Reactions  . Sulfonamide Derivatives     REACTION: rash and fever   Family History  Problem Relation Age of Onset  . Diabetes Mother   . Diabetes Father   . Heart disease Father    BP 112/72  Pulse 58  Temp(Src) 98.1 F (36.7 C)  (Oral)  Ht 6\' 3"  (1.905 m)  Wt 269 lb (122.018 kg)  BMI 33.62 kg/m2  SpO2 97%  Review of Systems Denies weight loss.      Objective:   Physical Exam VITAL SIGNS:  See vs page GENERAL: no distress. There is no palpable thyroid enlargement.  No thyroid nodule is palpable.  No palpable lymphadenopathy at the anterior neck. Feet: sees podiatry   Lab Results  Component Value Date   HGBA1C 6.0* 01/26/2011      Assessment & Plan:  Dm, well-controlled.  rx is limited by nausea.

## 2011-02-23 NOTE — Patient Instructions (Addendum)
please continue the same medications for now, including the reduced metformin. Please make a follow-up appointment in 6 months.

## 2011-02-27 LAB — CBC
HCT: 42.5
Hemoglobin: 14.4
MCHC: 33.9
MCV: 78.9
Platelets: 345
RBC: 5.39
RDW: 14.5
WBC: 12.7 — ABNORMAL HIGH

## 2011-02-27 LAB — LIPID PANEL
Cholesterol: 193
HDL: 39 — ABNORMAL LOW
LDL Cholesterol: 132 — ABNORMAL HIGH
Total CHOL/HDL Ratio: 4.9
Triglycerides: 108
VLDL: 22

## 2011-02-27 LAB — BASIC METABOLIC PANEL
BUN: 13
CO2: 29
Calcium: 9
Chloride: 99
Creatinine, Ser: 0.69
GFR calc Af Amer: >60
GFR calc non Af Amer: >60
Glucose, Bld: 113 — ABNORMAL HIGH
Potassium: 3.1 — ABNORMAL LOW
Sodium: 136

## 2011-02-27 LAB — HEMOGLOBIN A1C
Hgb A1c MFr Bld: 6.7 — ABNORMAL HIGH
Mean Plasma Glucose: 161

## 2011-03-03 DIAGNOSIS — Z0271 Encounter for disability determination: Secondary | ICD-10-CM

## 2011-03-05 LAB — POCT I-STAT, CHEM 8
BUN: 11
BUN: 13
Calcium, Ion: 1 — ABNORMAL LOW
Calcium, Ion: 1.03 — ABNORMAL LOW
Chloride: 104
Chloride: 104
Creatinine, Ser: 1
Creatinine, Ser: 1
Glucose, Bld: 132 — ABNORMAL HIGH
Glucose, Bld: 138 — ABNORMAL HIGH
HCT: 40
HCT: 43
Hemoglobin: 13.6
Hemoglobin: 14.6
Potassium: 3.8
Potassium: 6.4
Sodium: 133 — ABNORMAL LOW
Sodium: 136
TCO2: 24
TCO2: 26

## 2011-03-05 LAB — POTASSIUM: Potassium: 4.8

## 2011-03-17 ENCOUNTER — Telehealth: Payer: Self-pay | Admitting: Pulmonary Disease

## 2011-03-17 NOTE — Telephone Encounter (Signed)
I spoke with pt and he states he would like a refill on his [place]. I advised pt we have he is not taking this according to his OV 01/14/11. Pt states he would like to restart the ambien. Please advise Dr. Craige Cotta, thanks  Carver Fila, CMA

## 2011-03-18 MED ORDER — ZOLPIDEM TARTRATE 5 MG PO TABS: 5.0000 mg | ORAL_TABLET | Freq: Every evening | ORAL | Status: DC | PRN

## 2011-03-18 NOTE — Telephone Encounter (Signed)
Rx has been called into [facility]. LMOMTCB to make pt aware of this

## 2011-03-18 NOTE — Telephone Encounter (Signed)
Okay to send script for ambien 5 mg qhs prn insomnia, dispense 30 with 1 refill.

## 2011-03-19 NOTE — Telephone Encounter (Signed)
Pt is aware rx for Remus Loffler has been called to his pharmacy. Nothing further needed at this time.

## 2011-03-30 ENCOUNTER — Telehealth: Payer: Self-pay | Admitting: Pulmonary Disease

## 2011-03-30 NOTE — Telephone Encounter (Signed)
Zolpidem PA initiated with [facility] @ [phone]. <e,ber ID # [id number] Q. Per representative with [facility], Zolpidem PA can only be done by fax. They are faxing a from so Dr. Craige Cotta may review and sign then have his nurse fax back.

## 2011-03-30 NOTE — Telephone Encounter (Signed)
Form received and placed in VS look at folder so he may review questions and sign. Will forward msg to Mindy so she is aware.

## 2011-04-02 NOTE — Telephone Encounter (Signed)
No i have no received anything

## 2011-04-02 NOTE — Telephone Encounter (Signed)
Mindy, has Dr. Craige Cotta signed PA form for Zolpidem yet?

## 2011-04-03 NOTE — Telephone Encounter (Signed)
Per Mindy, Dr. Craige Cotta has this form.

## 2011-04-06 ENCOUNTER — Ambulatory Visit (INDEPENDENT_AMBULATORY_CARE_PROVIDER_SITE_OTHER): Payer: Medicare Other | Admitting: Internal Medicine

## 2011-04-06 ENCOUNTER — Encounter: Payer: Self-pay | Admitting: Internal Medicine

## 2011-04-06 VITALS — BP 112/68 | HR 61 | Temp 97.7°F | Ht 75.0 in | Wt 271.8 lb

## 2011-04-06 DIAGNOSIS — I10 Essential (primary) hypertension: Secondary | ICD-10-CM

## 2011-04-06 DIAGNOSIS — E291 Testicular hypofunction: Secondary | ICD-10-CM

## 2011-04-06 DIAGNOSIS — D869 Sarcoidosis, unspecified: Secondary | ICD-10-CM

## 2011-04-06 DIAGNOSIS — Z23 Encounter for immunization: Secondary | ICD-10-CM

## 2011-04-06 DIAGNOSIS — E119 Type 2 diabetes mellitus without complications: Secondary | ICD-10-CM

## 2011-04-06 NOTE — Patient Instructions (Signed)
It was good to see you today. We have reviewed your prior records including labs and tests today Medications reviewed, no changes at this time. Flu shot done today Please schedule followup in 6 months for review, call sooner if problems.

## 2011-04-06 NOTE — Progress Notes (Signed)
Subjective:    Patient ID: [name], male    DOB: 1970/08/06, 40 y.o.   MRN: [id number]  HPI here for follow up    DM2 -  on metformin + januvia -check cbgs at home 2 x/day - home cbgs 120s - no polyuria or polydipsia - no nausea and vomiting or abdominal pain- seeing endo for same ([facility])   sarcoidosis - involves lungs, joints and ?brain/liver - prev maintained on MTX for same but off 03/2010 due to insurance issues, Temporary low dose pred - then resumed MTX 2012, off pred since resuming MTX-  followed by pulm and rheum (prev [facility], now [facility]), also neuro ([facility]) -  no recent arthritis symptoms     CAD - follows with [facility] fro same - hosp and cath 06/2009: nonobst dx, med mgmt - no CP since that time - chronic ankle edema - no change in symptoms in recent months, resolves with elevation and furosemide use -s/p stress test 04/24/10 due to continued dyspnea on exertion> nonobstructive   HTN - reports compliance with ongoing medical treatment. changes in medication reviewed (prev on atenolol, changed to metoprolol by pulm 12/03/09 for pulm issues). denies adverse side effects related to current therapy.     dyslipidemia - reports compliance with ongoing medical treatment and no changes in medication dose or frequency. denies adverse side effects related to current therapy. no GI or muscle c/o   ED with hypogonadism - tried viagra, levitra and cialis in past - prefers cialis at this time - reports no interest as primary cause of problems but denies depression -  also low testosterone level dx 10/2009 - now on IM replacement q 2 weeks  Past Medical History  Diagnosis Date  . PULMONARY SARCOIDOSIS     Mediastinal lymphadenopathy with biospy proven sarcodosis  . DIABETES MELLITUS, TYPE II   . TESTICULAR HYPOFUNCTION   . HYPERLIPIDEMIA   . Gout, unspecified   . POLYNEUROPATHY   . HYPERTENSION   . CORONARY ARTERY DISEASE   . GERD   . ERECTILE DYSFUNCTION, ORGANIC   . Edema     . TIA (transient ischemic attack)   . Arthritis     ? 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 Followed by Dr. Nickola Major  . Narcolepsy without cataplexy     MSLT 01/09/09 & MRI brain 01/09/09    Review of Systems  Constitutional: Negative for fever and fatigue.  Respiratory: Negative for cough and shortness of breath.   Cardiovascular: Negative for palpitations.  Neurological: Negative for dizziness and headaches.       Objective:   Physical Exam BP 112/68  Pulse 61  Temp(Src) 97.7 F (36.5 C) (Oral)  Ht 6\' 3"  (1.905 m)  Wt 271 lb 12.8 oz (123.288 kg)  BMI 33.97 kg/m2  SpO2 97% Wt Readings from Last 3 Encounters:  04/06/11 271 lb 12.8 oz (123.288 kg)  02/23/11 269 lb (122.018 kg)  01/14/11 270 lb 3.2 oz (122.562 kg)   Constitutional:  He appears overweight but well-developed and well-nourished. No distress.  Neck: Thick - Normal range of motion. Neck supple. No JVD present. No thyromegaly present.  Cardiovascular: Normal rate, regular rhythm and normal heart sounds.  No murmur heard. no BLE edema Pulmonary/Chest: Effort normal and breath sounds normal. No respiratory distress. no wheezes. Skin: Skin is warm and dry.  No erythema or ulceration.  Psychiatric: he has a normal mood and affect. behavior is normal. Judgment and thought content normal.   Lab Results  Component Value Date   WBC 7.1 01/27/2011   HGB 13.0 01/27/2011   HCT 38.0* 01/27/2011   PLT 256 01/27/2011   GLUCOSE 126* 01/26/2011   CHOL 144 01/26/2011   TRIG 90 01/26/2011   HDL 30* 01/26/2011   LDLCALC 96 01/26/2011   ALT 51 01/14/2011   AST 28 01/14/2011   NA 138 01/26/2011   K 3.1* 01/26/2011   CL 98 01/26/2011   CREATININE 0.82 01/26/2011   BUN 12 01/26/2011   CO2 29 01/26/2011   TSH 0.44 01/31/2010   INR 0.99 06/24/2009   HGBA1C 6.0* 01/26/2011   MICROALBUR 1.6 10/07/2009      Assessment & Plan:  See problem list. Medications and labs reviewed today. Time spent with pt today 25 minutes, greater than 50% time  spent counseling patient on diabetes, chest pain symptoms and ED as well as medication and interval hx review.

## 2011-04-06 NOTE — Assessment & Plan Note (Signed)
Follows with rheum (mirsha - [facility]) and pulm (sood - [facility]) and neuro Craige Cotta -[facility]) for same - symptoms controlled and stable on MTX - used pred only when unable to afford MTX with insurance issues The current medical regimen is effective;  continue present plan and medications.

## 2011-04-06 NOTE — Assessment & Plan Note (Signed)
Lab Results  Component Value Date   HGBA1C 6.0* 01/26/2011   Follows with endo/[facility] - improved off pred The current medical regimen is effective;  continue present plan and medications.

## 2011-04-06 NOTE — Assessment & Plan Note (Signed)
Dx 10/2009 - exac ED symptoms IM testosterone q 2 weeks

## 2011-04-06 NOTE — Assessment & Plan Note (Signed)
BP Readings from Last 3 Encounters:  04/06/11 112/68  02/23/11 112/72  01/14/11 116/70   The current medical regimen is effective;  continue present plan and medications.

## 2011-04-08 NOTE — Telephone Encounter (Signed)
Zolpidem has been APPROVED for 6 months starting on 03/30/11. Both the pharmacy and patient have been notified.

## 2011-04-15 ENCOUNTER — Telehealth: Payer: Self-pay

## 2011-04-15 NOTE — Telephone Encounter (Signed)
yes

## 2011-04-15 NOTE — Telephone Encounter (Signed)
Pt called requesting to restart his monthly testosterone injection, okay to schedule?

## 2011-04-16 MED ORDER — TESTOSTERONE CYPIONATE 200 MG/ML IM SOLN: 200.0000 mg | INTRAMUSCULAR | Status: DC

## 2011-04-16 NOTE — Telephone Encounter (Signed)
Rx signed and faxed to pharmacy on file per pt request.

## 2011-04-28 ENCOUNTER — Ambulatory Visit: Payer: Medicare Other | Admitting: *Deleted

## 2011-04-28 DIAGNOSIS — E291 Testicular hypofunction: Secondary | ICD-10-CM

## 2011-04-28 MED ORDER — TESTOSTERONE CYPIONATE 200 MG/ML IM SOLN
200.0000 mg | Freq: Once | INTRAMUSCULAR | Status: AC
  Administered 2011-04-28: 200 mg via INTRAMUSCULAR

## 2011-05-11 ENCOUNTER — Encounter: Payer: Self-pay | Admitting: Pulmonary Disease

## 2011-05-11 ENCOUNTER — Ambulatory Visit (INDEPENDENT_AMBULATORY_CARE_PROVIDER_SITE_OTHER): Payer: Medicare Other | Admitting: Pulmonary Disease

## 2011-05-11 VITALS — BP 120/68 | HR 63 | Temp 98.1°F | Ht 75.0 in | Wt 266.8 lb

## 2011-05-11 DIAGNOSIS — J45990 Exercise induced bronchospasm: Secondary | ICD-10-CM

## 2011-05-11 DIAGNOSIS — D869 Sarcoidosis, unspecified: Secondary | ICD-10-CM

## 2011-05-11 MED ORDER — LEVALBUTEROL TARTRATE 45 MCG/ACT IN AERO: 2.0000 | INHALATION_SPRAY | Freq: Four times a day (QID) | RESPIRATORY_TRACT | Status: DC | PRN

## 2011-05-11 NOTE — Assessment & Plan Note (Signed)
Advised him to try using albuterol inhaler as needed prior to exercising.  Depending on his response will decide if he needs maintenance inhaler therapy.

## 2011-05-11 NOTE — Assessment & Plan Note (Signed)
Stable.  He is on MTX per rheumatology in [place].

## 2011-05-11 NOTE — Progress Notes (Signed)
Chief Complaint  Patient presents with  . Sarcoidosis    He denies any chest pain.  Marland Kitchen Shortness of Breath    Pt says he has had a slight increase in his sob with exertion.  He has been out of his Qvar and Xopenex for approx 2 months.  . Allergic Rhinitis     Pt c/o red, itchy eyes and sneezing. He says he has been working in his yard and raking leaves.   CC: Dr. Craige Cotta, Dr. Lanell Matar  History of Present Illness: [name] is a 40 y.o. male with sarcoidosis with pulmonary, neuro, and joint involvement; asthma.  He has been exercising more.  He walks two miles per day and rides an exercise bike.  He has lost several lbs as a result.  He feels his breathing is better.  He still gets wheezing and dyspnea with exercise in cold weather.  He is okay otherwise.   Past Medical History  Diagnosis Date  . PULMONARY SARCOIDOSIS     Mediastinal lymphadenopathy with biospy proven sarcodosis  . DIABETES MELLITUS, TYPE II   . TESTICULAR HYPOFUNCTION   . HYPERLIPIDEMIA   . Gout, unspecified   . POLYNEUROPATHY   . HYPERTENSION   . CORONARY ARTERY DISEASE   . GERD   . ERECTILE DYSFUNCTION, ORGANIC   . Edema   . TIA (transient ischemic attack)   . Arthritis     ? 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 Followed by Dr. Nickola Major  . Narcolepsy without cataplexy     MSLT 01/09/09 & MRI brain 01/09/09    Past Surgical History  Procedure Date  . Bronchoscopy 08/21/08  . Mediastinoscopy 11/30/08    Current Outpatient Prescriptions on File Prior to Visit  Medication Sig Dispense Refill  . allopurinol (ZYLOPRIM) 300 MG tablet Take 300 mg by mouth daily.        Marland Kitchen aspirin 325 MG tablet Take 325 mg by mouth daily.        Marland Kitchen diltiazem (CARDIZEM) 60 MG tablet Take 60 mg by mouth 4 (four) times daily.        . famotidine (PEPCID) 40 MG tablet Take 40 mg by mouth daily.        . folic acid (FOLVITE) 1 MG tablet Take 1 mg by mouth daily.        Marland Kitchen gabapentin (NEURONTIN) 300 MG capsule Take 300 mg by  mouth 3 (three) times daily.        Marland Kitchen glucose blood (ACCU-CHEK ACTIVE STRIPS) test strip 1 each by Other route as needed. Use as instructed       . levETIRAcetam (KEPPRA) 500 MG tablet 2 tablets at bedtime       . metFORMIN (GLUCOPHAGE-XR) 500 MG 24 hr tablet Take 500 mg by mouth 2 (two) times daily.        . methotrexate (RHEUMATREX) 2.5 MG tablet Take 12.5 mg by mouth once a week. Caution:Chemotherapy. Protect from light.       . metoprolol (LOPRESSOR) 50 MG tablet Take 50 mg by mouth 2 (two) times daily.        . potassium chloride SA (K-DUR,KLOR-CON) 20 MEQ tablet Take 20 mEq by mouth 2 (two) times daily.        . pravastatin (PRAVACHOL) 40 MG tablet TAKE ONE TABLET BY MOUTH ONE TIME DAILY  30 tablet  5  . RABEprazole (ACIPHEX) 20 MG tablet Take 20 mg by mouth daily.        . saw palmetto  160 MG capsule Take 160 mg by mouth daily. Take 3 capsule once daily       . sitaGLIPtan ([name]) 100 MG tablet Take 100 mg by mouth daily.        Marland Kitchen Spacer/Aero-Holding Chambers (AEROCHAMBER MV) inhaler Use as instructed  1 each  0  . tadalafil (CIALIS) 5 MG tablet Take 5 mg by mouth daily as needed.        . testosterone cypionate (DEPO-TESTOSTERONE) 200 MG/ML injection Inject 1 mL (200 mg total) into the muscle every 14 (fourteen) days.  10 mL  1  . zolpidem (AMBIEN) 5 MG tablet Take 1 tablet (5 mg total) by mouth at bedtime as needed for sleep.  30 tablet  1  . DISCONTD: levalbuterol (XOPENEX HFA) 45 MCG/ACT inhaler Inhale 2 puffs into the lungs 4 (four) times daily as needed.          Allergies  Allergen Reactions  . Sulfonamide Derivatives     REACTION: rash and fever    Physical Exam:  Blood pressure 120/68, pulse 63, temperature 98.1 F (36.7 C), temperature source Oral, height 6\' 3"  (1.905 m), weight 266 lb 12.8 oz (121.02 kg), SpO2 100.00%. Body mass index is 33.35 kg/(m^2).  General - Healthy HEENT - no sinus tenderness, no oral exudate, no LAN Cardiac - s1s2 Chest - no  wheeze/rales Abdomen - soft, nontender Extremities - no edema Skin - no rashes Neurologic - normal strength Psychiatric - normal mood, behavior   Assessment/Plan:  Exercise-induced asthma Advised him to try using albuterol inhaler as needed prior to exercising.  Depending on his response will decide if he needs maintenance inhaler therapy.   PULMONARY SARCOIDOSIS Stable.  He is on MTX per rheumatology in [place].     Outpatient Encounter Prescriptions as of 05/11/2011  Medication Sig Dispense Refill  . allopurinol (ZYLOPRIM) 300 MG tablet Take 300 mg by mouth daily.        Marland Kitchen aspirin 325 MG tablet Take 325 mg by mouth daily.        Marland Kitchen diltiazem (CARDIZEM) 60 MG tablet Take 60 mg by mouth 4 (four) times daily.        . famotidine (PEPCID) 40 MG tablet Take 40 mg by mouth daily.        . folic acid (FOLVITE) 1 MG tablet Take 1 mg by mouth daily.        Marland Kitchen gabapentin (NEURONTIN) 300 MG capsule Take 300 mg by mouth 3 (three) times daily.        Marland Kitchen glucose blood (ACCU-CHEK ACTIVE STRIPS) test strip 1 each by Other route as needed. Use as instructed       . levETIRAcetam (KEPPRA) 500 MG tablet 2 tablets at bedtime       . metFORMIN (GLUCOPHAGE-XR) 500 MG 24 hr tablet Take 500 mg by mouth 2 (two) times daily.        . methotrexate (RHEUMATREX) 2.5 MG tablet Take 12.5 mg by mouth once a week. Caution:Chemotherapy. Protect from light.       . metoprolol (LOPRESSOR) 50 MG tablet Take 50 mg by mouth 2 (two) times daily.        . potassium chloride SA (K-DUR,KLOR-CON) 20 MEQ tablet Take 20 mEq by mouth 2 (two) times daily.        . pravastatin (PRAVACHOL) 40 MG tablet TAKE ONE TABLET BY MOUTH ONE TIME DAILY  30 tablet  5  . RABEprazole (ACIPHEX) 20 MG tablet Take 20 mg by mouth  daily.        . saw palmetto 160 MG capsule Take 160 mg by mouth daily. Take 3 capsule once daily       . sitaGLIPtan ([name]) 100 MG tablet Take 100 mg by mouth daily.        Marland Kitchen Spacer/Aero-Holding Chambers (AEROCHAMBER  MV) inhaler Use as instructed  1 each  0  . tadalafil (CIALIS) 5 MG tablet Take 5 mg by mouth daily as needed.        . testosterone cypionate (DEPO-TESTOSTERONE) 200 MG/ML injection Inject 1 mL (200 mg total) into the muscle every 14 (fourteen) days.  10 mL  1  . zolpidem (AMBIEN) 5 MG tablet Take 1 tablet (5 mg total) by mouth at bedtime as needed for sleep.  30 tablet  1  . levalbuterol (XOPENEX HFA) 45 MCG/ACT inhaler Inhale 2 puffs into the lungs every 6 (six) hours as needed for wheezing or shortness of breath (Use prior to exercise as needed).  1 Inhaler  3  . DISCONTD: beclomethasone (QVAR) 80 MCG/ACT inhaler Two puffs twice per day, and rinse mouth after using  1 Inhaler  12  . DISCONTD: levalbuterol (XOPENEX HFA) 45 MCG/ACT inhaler Inhale 2 puffs into the lungs 4 (four) times daily as needed.          Mikaele Stecher Pager:  [phone] 05/11/2011, 10:01 AM

## 2011-05-11 NOTE — Patient Instructions (Signed)
Try using inhaler prior to exercising Follow up in 6 months

## 2011-05-12 ENCOUNTER — Ambulatory Visit: Payer: Medicare Other | Admitting: *Deleted

## 2011-05-12 DIAGNOSIS — E291 Testicular hypofunction: Secondary | ICD-10-CM

## 2011-05-12 MED ORDER — TESTOSTERONE CYPIONATE 200 MG/ML IM SOLN
200.0000 mg | Freq: Once | INTRAMUSCULAR | Status: AC
  Administered 2011-05-12: 200 mg via INTRAMUSCULAR

## 2011-05-26 ENCOUNTER — Ambulatory Visit: Payer: Medicare Other | Admitting: *Deleted

## 2011-05-26 DIAGNOSIS — E291 Testicular hypofunction: Secondary | ICD-10-CM

## 2011-05-26 MED ORDER — TESTOSTERONE CYPIONATE 200 MG/ML IM SOLN
200.0000 mg | Freq: Once | INTRAMUSCULAR | Status: AC
  Administered 2011-05-26: 200 mg via INTRAMUSCULAR

## 2011-06-11 ENCOUNTER — Other Ambulatory Visit: Payer: Self-pay

## 2011-06-11 ENCOUNTER — Ambulatory Visit (INDEPENDENT_AMBULATORY_CARE_PROVIDER_SITE_OTHER): Payer: Medicare Other

## 2011-06-11 DIAGNOSIS — J45990 Exercise induced bronchospasm: Secondary | ICD-10-CM

## 2011-06-11 DIAGNOSIS — N529 Male erectile dysfunction, unspecified: Secondary | ICD-10-CM

## 2011-06-11 MED ORDER — TESTOSTERONE CYPIONATE 200 MG/ML IM SOLN
200.0000 mg | Freq: Once | INTRAMUSCULAR | Status: AC
  Administered 2011-06-11: 200 mg via INTRAMUSCULAR

## 2011-06-11 MED ORDER — LEVALBUTEROL TARTRATE 45 MCG/ACT IN AERO: 2.0000 | INHALATION_SPRAY | Freq: Four times a day (QID) | RESPIRATORY_TRACT | Status: DC | PRN

## 2011-06-11 MED ORDER — POTASSIUM CHLORIDE CRYS ER 20 MEQ PO TBCR: 20.0000 meq | EXTENDED_RELEASE_TABLET | Freq: Two times a day (BID) | ORAL | Status: DC

## 2011-06-11 MED ORDER — METFORMIN HCL ER 500 MG PO TB24: 500.0000 mg | ORAL_TABLET | Freq: Two times a day (BID) | ORAL | Status: DC

## 2011-06-11 MED ORDER — DILTIAZEM HCL 60 MG PO TABS: 60.0000 mg | ORAL_TABLET | Freq: Four times a day (QID) | ORAL | Status: DC

## 2011-06-11 MED ORDER — PRAVASTATIN SODIUM 40 MG PO TABS: 40.0000 mg | ORAL_TABLET | Freq: Every day | ORAL | Status: DC

## 2011-06-11 MED ORDER — SITAGLIPTIN PHOSPHATE 100 MG PO TABS: 100.0000 mg | ORAL_TABLET | Freq: Every day | ORAL | Status: DC

## 2011-06-11 MED ORDER — METOPROLOL TARTRATE 50 MG PO TABS: 50.0000 mg | ORAL_TABLET | Freq: Two times a day (BID) | ORAL | Status: DC

## 2011-06-11 MED ORDER — ALLOPURINOL 300 MG PO TABS: 300.0000 mg | ORAL_TABLET | Freq: Every day | ORAL | Status: DC

## 2011-06-25 ENCOUNTER — Ambulatory Visit (INDEPENDENT_AMBULATORY_CARE_PROVIDER_SITE_OTHER): Payer: Medicare Other | Admitting: *Deleted

## 2011-06-25 DIAGNOSIS — E291 Testicular hypofunction: Secondary | ICD-10-CM

## 2011-06-25 MED ORDER — TESTOSTERONE CYPIONATE 200 MG/ML IM SOLN
200.0000 mg | Freq: Once | INTRAMUSCULAR | Status: AC
  Administered 2011-06-25: 200 mg via INTRAMUSCULAR

## 2011-07-09 ENCOUNTER — Encounter: Payer: Self-pay | Admitting: Internal Medicine

## 2011-07-09 ENCOUNTER — Ambulatory Visit (INDEPENDENT_AMBULATORY_CARE_PROVIDER_SITE_OTHER): Payer: Medicare Other | Admitting: Internal Medicine

## 2011-07-09 ENCOUNTER — Other Ambulatory Visit: Payer: Self-pay | Admitting: Internal Medicine

## 2011-07-09 ENCOUNTER — Other Ambulatory Visit (INDEPENDENT_AMBULATORY_CARE_PROVIDER_SITE_OTHER): Payer: Medicare Other

## 2011-07-09 VITALS — BP 110/72 | HR 68 | Temp 97.0°F | Wt 260.1 lb

## 2011-07-09 DIAGNOSIS — R634 Abnormal weight loss: Secondary | ICD-10-CM

## 2011-07-09 DIAGNOSIS — R197 Diarrhea, unspecified: Secondary | ICD-10-CM

## 2011-07-09 DIAGNOSIS — I10 Essential (primary) hypertension: Secondary | ICD-10-CM

## 2011-07-09 DIAGNOSIS — E119 Type 2 diabetes mellitus without complications: Secondary | ICD-10-CM

## 2011-07-09 DIAGNOSIS — E291 Testicular hypofunction: Secondary | ICD-10-CM

## 2011-07-09 LAB — CBC WITH DIFFERENTIAL/PLATELET
Basophils Absolute: 0 10*3/uL (ref 0.0–0.1)
Basophils Relative: 0.7 % (ref 0.0–3.0)
Eosinophils Absolute: 0.3 10*3/uL (ref 0.0–0.7)
Eosinophils Relative: 4.3 % (ref 0.0–5.0)
HCT: 46.5 % (ref 39.0–52.0)
Hemoglobin: 15.9 g/dL (ref 13.0–17.0)
Lymphocytes Relative: 37.2 % (ref 12.0–46.0)
Lymphs Abs: 2.3 10*3/uL (ref 0.7–4.0)
MCHC: 34.1 g/dL (ref 30.0–36.0)
MCV: 83.9 fl (ref 78.0–100.0)
Monocytes Absolute: 0.5 10*3/uL (ref 0.1–1.0)
Monocytes Relative: 8.4 % (ref 3.0–12.0)
Neutro Abs: 3.1 10*3/uL (ref 1.4–7.7)
Neutrophils Relative %: 49.4 % (ref 43.0–77.0)
Platelets: 268 10*3/uL (ref 150.0–400.0)
RBC: 5.54 Mil/uL (ref 4.22–5.81)
RDW: 14.8 % — ABNORMAL HIGH (ref 11.5–14.6)
WBC: 6.3 10*3/uL (ref 4.5–10.5)

## 2011-07-09 LAB — HEPATIC FUNCTION PANEL
ALT: 41 U/L (ref 0–53)
AST: 28 U/L (ref 0–37)
Albumin: 4.3 g/dL (ref 3.5–5.2)
Alkaline Phosphatase: 72 U/L (ref 39–117)
Bilirubin, Direct: 0 mg/dL (ref 0.0–0.3)
Total Bilirubin: 0.4 mg/dL (ref 0.3–1.2)
Total Protein: 8.1 g/dL (ref 6.0–8.3)

## 2011-07-09 LAB — TSH: TSH: 0.61 u[IU]/mL (ref 0.35–5.50)

## 2011-07-09 LAB — MICROALBUMIN / CREATININE URINE RATIO
Creatinine,U: 430.7 mg/dL
Microalb Creat Ratio: 2.7 mg/g (ref 0.0–30.0)
Microalb, Ur: 11.5 mg/dL — ABNORMAL HIGH (ref 0.0–1.9)

## 2011-07-09 LAB — BASIC METABOLIC PANEL
BUN: 11 mg/dL (ref 6–23)
CO2: 32 mEq/L (ref 19–32)
Calcium: 9.8 mg/dL (ref 8.4–10.5)
Chloride: 99 mEq/L (ref 96–112)
Creatinine, Ser: 0.8 mg/dL (ref 0.4–1.5)
GFR: 132.94 mL/min (ref >60.00)
Glucose, Bld: 128 mg/dL — ABNORMAL HIGH (ref 70–99)
Potassium: 3.4 mEq/L — ABNORMAL LOW (ref 3.5–5.1)
Sodium: 140 mEq/L (ref 135–145)

## 2011-07-09 LAB — HEMOGLOBIN A1C: Hgb A1c MFr Bld: 5.6 % (ref 4.6–6.5)

## 2011-07-09 MED ORDER — TESTOSTERONE CYPIONATE 200 MG/ML IM SOLN
200.0000 mg | Freq: Once | INTRAMUSCULAR | Status: AC
  Administered 2011-07-09: 200 mg via INTRAMUSCULAR

## 2011-07-09 NOTE — Progress Notes (Signed)
Subjective:    Patient ID: [name], male    DOB: 1970-07-07, 41 y.o.   MRN: [id number]  HPI  here for follow up - reviewed chronic medical issues   DM2 -  on metformin + januvia -check cbgs at home 2 x/day - home cbgs 120s - no polyuria or polydipsia - no nausea and vomiting or abdominal pain but ++diarrhea x 6 months- seeing endo for same (ellison), ?need both meds?   sarcoidosis - involves lungs, joints and ?brain/liver - prev maintained on MTX for same but off 03/2010 due to insurance issues, Temporary low dose pred - then resumed MTX 2012, off pred since resuming MTX- followed by pulm and rheum (prev hawkes, now [facility] mishra), also neuro (kirby) -  no recent arthritis symptoms     CAD - follows with [facility] fro same - hosp and cath 06/2009: nonobst dx, med mgmt - no CP since that time - resolved ankle edema - ?continue furosemide use -s/p stress test 04/24/10 due to continued dyspnea on exertion> nonobstructive   HTN - reports compliance with ongoing medical treatment. changes in medication reviewed (prev on atenolol, changed to metoprolol by pulm 12/03/09 for pulm issues). denies adverse side effects related to current therapy.     dyslipidemia - on statin, reports compliance with ongoing medical treatment and no changes in medication dose or frequency. denies adverse side effects related to current therapy   ED with hypogonadism - tried viagra, levitra and cialis in past - prefers cialis at this time - reports no interest as primary cause of problems but denies depression -  also low testosterone level dx 10/2009 - now on IM replacement q 2 weeks  Past Medical History  Diagnosis Date  . PULMONARY SARCOIDOSIS     Mediastinal lymphadenopathy with biospy proven sarcodosis  . DIABETES MELLITUS, TYPE II   . TESTICULAR HYPOFUNCTION   . HYPERLIPIDEMIA   . Gout, unspecified   . POLYNEUROPATHY   . HYPERTENSION   . CORONARY ARTERY DISEASE   . GERD   . ERECTILE DYSFUNCTION, ORGANIC     . Edema   . TIA (transient ischemic attack)   . Arthritis     ? 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 Followed by Dr. Nickola Major  . Narcolepsy without cataplexy     MSLT 01/09/09 & MRI brain 01/09/09    Review of Systems  Constitutional: Positive for unexpected weight change (weight loss). Negative for fever and fatigue.  Respiratory: Negative for cough and shortness of breath.   Cardiovascular: Negative for chest pain and palpitations.  Gastrointestinal: Positive for diarrhea (following any meal intake x 6 months). Negative for nausea, abdominal pain, constipation, blood in stool, abdominal distention and rectal pain.  Neurological: Negative for dizziness and headaches.       Objective:   Physical Exam  BP 110/72  Pulse 68  Temp(Src) 97 F (36.1 C) (Oral)  Wt 260 lb 1.9 oz (117.99 kg)  SpO2 97% Wt Readings from Last 3 Encounters:  07/09/11 260 lb 1.9 oz (117.99 kg)  05/11/11 266 lb 12.8 oz (121.02 kg)  04/06/11 271 lb 12.8 oz (123.288 kg)   Constitutional:  He is well-developed and well-nourished. No distress.  Neck: Normal range of motion. Neck supple. No JVD present. No thyromegaly present.  Cardiovascular: Normal rate, regular rhythm and normal heart sounds.  No murmur heard. no BLE edema Pulmonary/Chest: Effort normal and breath sounds normal. No respiratory distress. no wheezes. Abd: SNTND, +BS, no mass Psychiatric: he has  a normal mood and affect. behavior is normal. Judgment and thought content normal.   Lab Results  Component Value Date   WBC 7.1 01/27/2011   HGB 13.0 01/27/2011   HCT 38.0* 01/27/2011   PLT 256 01/27/2011   GLUCOSE 126* 01/26/2011   CHOL 144 01/26/2011   TRIG 90 01/26/2011   HDL 30* 01/26/2011   LDLCALC 96 01/26/2011   ALT 51 01/14/2011   AST 28 01/14/2011   NA 138 01/26/2011   K 3.1* 01/26/2011   CL 98 01/26/2011   CREATININE 0.82 01/26/2011   BUN 12 01/26/2011   CO2 29 01/26/2011   TSH 0.44 01/31/2010   INR 0.99 06/24/2009   HGBA1C 6.0* 01/26/2011    MICROALBUR 1.6 10/07/2009      Assessment & Plan:  See problem list. Medications and labs reviewed today.  Diarrhea/weight loss - meal induced loose bowel - no blood, abdominal pain, travel or fever, but associated with rapid and now unintentional weight loss - check labs  And consider stopping possible offending DM meds Alma Friendly and/or metformin)

## 2011-07-09 NOTE — Assessment & Plan Note (Signed)
BP Readings from Last 3 Encounters:  07/09/11 110/72  05/11/11 120/68  04/06/11 112/68   The current medical regimen is effective;  continue present plan and medications.

## 2011-07-09 NOTE — Patient Instructions (Signed)
It was good to see you today. Test(s) ordered today. Your results will be called to you after review (48-72hours after test completion). If any changes need to be made, you will be notified at that time. Medications reviewed: Keep off furosemide and hctz - will plan to stop [name] if a1c(DM) labs ok - let us know if the diarrhea is betteror worse with these medication changes Please schedule followup in 3 months for weight check and medication review, call sooner if problems.

## 2011-07-09 NOTE — Assessment & Plan Note (Signed)
Lab Results  Component Value Date   HGBA1C 6.0* 01/26/2011   Follows with endo/ellison intermittently - improved off pred Check a1c and annual microalb/cr - titrate meds as needed The current medical regimen is effective;  continue present plan and medications.

## 2011-07-09 NOTE — Assessment & Plan Note (Signed)
Dx 10/2009 - exac ED symptoms IM testosterone q 2 weeks 

## 2011-07-23 ENCOUNTER — Ambulatory Visit (INDEPENDENT_AMBULATORY_CARE_PROVIDER_SITE_OTHER): Payer: Medicare Other | Admitting: *Deleted

## 2011-07-23 DIAGNOSIS — E291 Testicular hypofunction: Secondary | ICD-10-CM

## 2011-07-23 MED ORDER — TESTOSTERONE CYPIONATE 200 MG/ML IM SOLN
200.0000 mg | Freq: Once | INTRAMUSCULAR | Status: AC
  Administered 2011-07-23: 200 mg via INTRAMUSCULAR

## 2011-07-29 DIAGNOSIS — E119 Type 2 diabetes mellitus without complications: Secondary | ICD-10-CM | POA: Insufficient documentation

## 2011-08-06 ENCOUNTER — Ambulatory Visit (INDEPENDENT_AMBULATORY_CARE_PROVIDER_SITE_OTHER): Payer: Medicare Other | Admitting: *Deleted

## 2011-08-06 DIAGNOSIS — E291 Testicular hypofunction: Secondary | ICD-10-CM

## 2011-08-06 MED ORDER — TESTOSTERONE CYPIONATE 200 MG/ML IM SOLN
200.0000 mg | Freq: Once | INTRAMUSCULAR | Status: AC
Start: 2011-08-06 — End: 2011-08-06
  Administered 2011-08-06: 200 mg via INTRAMUSCULAR

## 2011-08-06 MED ORDER — TADALAFIL 5 MG PO TABS: 5.0000 mg | ORAL_TABLET | Freq: Every day | ORAL | Status: DC | PRN

## 2011-08-18 ENCOUNTER — Ambulatory Visit (INDEPENDENT_AMBULATORY_CARE_PROVIDER_SITE_OTHER): Payer: Medicare Other | Admitting: *Deleted

## 2011-08-18 DIAGNOSIS — E291 Testicular hypofunction: Secondary | ICD-10-CM

## 2011-08-18 MED ORDER — TESTOSTERONE CYPIONATE 200 MG/ML IM SOLN
200.0000 mg | Freq: Once | INTRAMUSCULAR | Status: AC
  Administered 2011-08-18: 200 mg via INTRAMUSCULAR

## 2011-08-20 ENCOUNTER — Ambulatory Visit: Payer: Medicare Other

## 2011-08-25 ENCOUNTER — Telehealth: Payer: Self-pay | Admitting: Internal Medicine

## 2011-08-25 NOTE — Telephone Encounter (Signed)
Received 3 pages. Sent to Dr. Felicity Coyer. SD 08/24/2011.

## 2011-08-27 ENCOUNTER — Emergency Department (HOSPITAL_COMMUNITY)
Admission: EM | Admit: 2011-08-27 | Discharge: 2011-08-27 | Disposition: A | Discharge status: Home Health | Payer: Medicare Other | Attending: Emergency Medicine | Admitting: Emergency Medicine

## 2011-08-27 ENCOUNTER — Encounter (HOSPITAL_COMMUNITY): Payer: Self-pay | Admitting: Emergency Medicine

## 2011-08-27 DIAGNOSIS — K219 Gastro-esophageal reflux disease without esophagitis: Secondary | ICD-10-CM | POA: Insufficient documentation

## 2011-08-27 DIAGNOSIS — Z7982 Long term (current) use of aspirin: Secondary | ICD-10-CM | POA: Insufficient documentation

## 2011-08-27 DIAGNOSIS — I10 Essential (primary) hypertension: Secondary | ICD-10-CM | POA: Insufficient documentation

## 2011-08-27 DIAGNOSIS — Z8673 Personal history of transient ischemic attack (TIA), and cerebral infarction without residual deficits: Secondary | ICD-10-CM | POA: Insufficient documentation

## 2011-08-27 DIAGNOSIS — E785 Hyperlipidemia, unspecified: Secondary | ICD-10-CM | POA: Insufficient documentation

## 2011-08-27 DIAGNOSIS — R112 Nausea with vomiting, unspecified: Secondary | ICD-10-CM | POA: Insufficient documentation

## 2011-08-27 DIAGNOSIS — M109 Gout, unspecified: Secondary | ICD-10-CM | POA: Insufficient documentation

## 2011-08-27 DIAGNOSIS — D869 Sarcoidosis, unspecified: Secondary | ICD-10-CM | POA: Insufficient documentation

## 2011-08-27 DIAGNOSIS — Z87891 Personal history of nicotine dependence: Secondary | ICD-10-CM | POA: Insufficient documentation

## 2011-08-27 DIAGNOSIS — Z79899 Other long term (current) drug therapy: Secondary | ICD-10-CM | POA: Insufficient documentation

## 2011-08-27 DIAGNOSIS — I2510 Atherosclerotic heart disease of native coronary artery without angina pectoris: Secondary | ICD-10-CM | POA: Insufficient documentation

## 2011-08-27 DIAGNOSIS — E119 Type 2 diabetes mellitus without complications: Secondary | ICD-10-CM | POA: Insufficient documentation

## 2011-08-27 LAB — POCT I-STAT, CHEM 8
BUN: 11 mg/dL (ref 6–23)
Calcium, Ion: 1.16 mmol/L (ref 1.12–1.32)
Chloride: 100 mEq/L (ref 96–112)
Creatinine, Ser: 0.9 mg/dL (ref 0.50–1.35)
Glucose, Bld: 146 mg/dL — ABNORMAL HIGH (ref 70–99)
HCT: 52 % (ref 39.0–52.0)
Hemoglobin: 17.7 g/dL — ABNORMAL HIGH (ref 13.0–17.0)
Potassium: 4.2 mEq/L (ref 3.5–5.1)
Sodium: 138 mEq/L (ref 135–145)
TCO2: 30 mmol/L (ref 0–100)

## 2011-08-27 MED ORDER — ONDANSETRON HCL 4 MG/2ML IJ SOLN
4.0000 mg | Freq: Once | INTRAMUSCULAR | Status: AC
  Administered 2011-08-27: 4 mg via INTRAVENOUS
  Filled 2011-08-27: qty 2

## 2011-08-27 MED ORDER — ONDANSETRON 8 MG PO TBDP: 8.0000 mg | ORAL_TABLET | Freq: Three times a day (TID) | ORAL | Status: AC | PRN

## 2011-08-27 MED ORDER — SODIUM CHLORIDE 0.9 % IV SOLN
INTRAVENOUS | Status: DC
  Administered 2011-08-27: 11:00:00 via INTRAVENOUS

## 2011-08-27 MED ORDER — SODIUM CHLORIDE 0.9 % IV BOLUS (SEPSIS)
500.0000 mL | Freq: Once | INTRAVENOUS | Status: AC
  Administered 2011-08-27: 500 mL via INTRAVENOUS

## 2011-08-27 NOTE — Discharge Instructions (Signed)
Please read and follow all provided instructions.  Your diagnoses today include:  1. Nausea and vomiting     Tests performed today include:  Electrolytes  Vital signs. See below for your results today.   Medications prescribed:   Zofran (ondansetron) - for nausea and vomiting  Take any prescribed medications only as directed.  Home care instructions:   Follow any educational materials contained in this packet.   Your abdominal pain, nausea, vomiting may be caused by a viral gastroenteritis also called 'stomach flu'. You should rest for the next several days. Keep drinking plenty of fluids and use the medicine for nausea as directed.    Drink clear liquids for the next 24 hours and introduce solid foods slowly after 24 hours using the b.r.a.t. diet (see attached information sheet).    Follow-up instructions: Please follow-up with your primary care provider in the next 2 days for further evaluation of your symptoms. If you are not feeling better in 48 hours you may have a condition that is more serious and you need re-evaluation. If you do not have a primary care doctor -- see below for referral information.   Return instructions:  SEEK IMMEDIATE MEDICAL ATTENTION IF:  If you have pain that does not go away or becomes severe   A temperature above 101F develops   Repeated vomiting occurs (multiple episodes)   If you have pain that becomes localized to portions of the abdomen. The right side could possibly be appendicitis. In an adult, the left lower portion of the abdomen could be colitis or diverticulitis.   Blood is being passed in stools or vomit (bright red or black tarry stools)   You develop chest pain, difficulty breathing, dizziness or fainting, or become confused, poorly responsive, or inconsolable (young children)  If you have any other emergent concerns regarding your health  Additional Information: Abdominal (belly) pain can be caused by many things. Your  caregiver performed an examination and possibly ordered blood/urine tests and imaging (CT scan, x-rays, ultrasound). Many cases can be observed and treated at home after initial evaluation in the emergency department. Even though you are being discharged home, abdominal pain can be unpredictable. Therefore, you need a repeated exam if your pain does not resolve, returns, or worsens. Most patients with abdominal pain don't have to be admitted to the hospital or have surgery, but serious problems like appendicitis and gallbladder attacks can start out as nonspecific pain. Many abdominal conditions cannot be diagnosed in one visit, so follow-up evaluations are very important.  Your vital signs today were: BP 119/72  Pulse 99  Temp(Src) 99.1 F (37.3 C) (Oral)  Resp 20  SpO2 99% If your blood pressure (bp) was elevated above 135/85 this visit, please have this repeated by your doctor within one month. -------------- No Primary Care Doctor Call [facility]  [phone] Other agencies that provide inexpensive medical care    [facility]  [phone]    [facility]  [phone]    [facility]  [phone]    [facility]  [phone]    [facility]  [phone]    [facility]  [phone] -------------- RESOURCE GUIDE:  Dental Problems  Patients with Medicaid: [facility] [phone] [address]  [address] Phone:  [phone]                                                   Phone:  [phone]  If unable to pay or uninsured, contact:  [facility] or [facility]. to become qualified for the adult dental clinic.  Chronic Pain Problems Contact [facility]  [phone] Patients need to be referred by their primary care doctor.  Insufficient Money for Medicine Contact [employer]:  call "211" or [facility]  [phone].  Psychological Services [facility]  [phone] [facility]  [phone] [facility]   [phone] (emergency services [phone])  Substance Abuse Resources [facility]  [phone] [facility] [phone] [address] [phone] Floydene Flock [phone] Residential & Outpatient Substance Abuse Program  [phone]  Abuse/Neglect [facility] Child Abuse Hotline [phone] [facility] Child Abuse Hotline [phone] (After Hours)  Emergency Shelter [facility] [phone]  M[facility] Homes [facility] [phone] [place] [facility] [phone]  [facility] Resources  [facility]     [employer]                          [facility] Dept. [address] [place]                       [address]      [address]  Blondell Reveal Phone:  [phone]                                   Phone:  [phone]                 Phone:  [phone]  [facility] Phone:  [phone]  [facility] Child Abuse Hotline ([phone] [phone] (After Hours)

## 2011-08-27 NOTE — ED Notes (Signed)
States started vomitinf this am at 5 am

## 2011-08-27 NOTE — ED Notes (Signed)
Pt presents to department for evaluation of diffuse abdominal pain and vomiting. Onset this morning at 5:00am. Pt states 5/10 "cramping" pain at the time. Also states several episodes of vomiting. Abdomen soft and non tender. Bowel sounds present all quadrants. Denies fever. Denies urinary symptoms. He is conscious alert and oriented x4. No signs of distress at the time.

## 2011-08-27 NOTE — ED Provider Notes (Signed)
History     CSN: [id number]  Arrival date & time 08/27/11  4098   First MD Initiated Contact with Patient 08/27/11 [phone]      Chief Complaint  Patient presents with  . Emesis    (Consider location/radiation/quality/duration/timing/severity/associated sxs/prior treatment) HPI Comments: Patient with history of diabetes and pulmonary sarcoidosis --  presents with onset of vomiting at 5 AM. Patient states he felt normal when he went to bed last night. Patient complains of cramping midabdominal pain, no diarrhea. He states mild shortness of breath, no chest pain. No fever, upper respiratory symptoms, cough. No lower extremity swelling. No urinary symptoms. Patient has not used any treatments. Nothing makes condition better or worse. No known sick contacts. Has not checked blood sugar today.   Patient is a 41 y.o. male presenting with vomiting. The history is provided by the patient.  Emesis  This is a new problem. The current episode started 3 to 5 hours ago. Episode frequency: 3 times per hour. The emesis has an appearance of stomach contents. There has been no fever. Associated symptoms include abdominal pain. Pertinent negatives include no chills, no cough, no diarrhea, no fever, no headaches, no myalgias and no URI.    Past Medical History  Diagnosis Date  . PULMONARY SARCOIDOSIS     Mediastinal lymphadenopathy with biospy proven sarcodosis  . DIABETES MELLITUS, TYPE II   . TESTICULAR HYPOFUNCTION   . HYPERLIPIDEMIA   . Gout, unspecified   . POLYNEUROPATHY   . HYPERTENSION   . CORONARY ARTERY DISEASE   . GERD   . ERECTILE DYSFUNCTION, ORGANIC   . Edema   . TIA (transient ischemic attack)   . Arthritis     ? 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 Followed by Dr. Nickola Major  . Narcolepsy without cataplexy     MSLT 01/09/09 & MRI brain 01/09/09    Past Surgical History  Procedure Date  . Bronchoscopy 08/21/08  . Mediastinoscopy 11/30/08    Family History  Problem Relation  Age of Onset  . Diabetes Mother   . Diabetes Father   . Heart disease Father     History  Substance Use Topics  . Smoking status: Former Smoker -- 15 years    Types: Cigarettes    Quit date: 07/09/2008  . Smokeless tobacco: Not on file   Comment: Married, lives with wife and 3 kids. Currently student. Prev worked as a Research officer, trade union, Engineer, materials at [employer]  . Alcohol Use: Yes      Review of Systems  Constitutional: Negative for fever and chills.  HENT: Negative for sore throat and rhinorrhea.   Eyes: Negative for redness.  Respiratory: Positive for shortness of breath. Negative for cough and wheezing.   Cardiovascular: Negative for chest pain.  Gastrointestinal: Positive for vomiting and abdominal pain. Negative for nausea, diarrhea and blood in stool.  Genitourinary: Negative for dysuria.  Musculoskeletal: Negative for myalgias.  Skin: Negative for rash.  Neurological: Negative for headaches.    Allergies  Sulfonamide derivatives  Home Medications   Current Outpatient Rx  Name Route Sig Dispense Refill  . ALLOPURINOL 300 MG PO TABS Oral Take 300 mg by mouth daily.    . ASPIRIN 325 MG PO TABS Oral Take 325 mg by mouth daily.      Marland Kitchen CIPROFLOXACIN HCL 500 MG PO TABS Oral Take 500 mg by mouth 2 (two) times daily.    Marland Kitchen COLCRYS 0.6 MG PO TABS Oral Take 0.6 mg by mouth daily.     Marland Kitchen  DILTIAZEM HCL 60 MG PO TABS Oral Take 1 tablet (60 mg total) by mouth 4 (four) times daily. 120 tablet 2  . FOLIC ACID 1 MG PO TABS Oral Take 1 mg by mouth daily.      Marland Kitchen LEVALBUTEROL TARTRATE 45 MCG/ACT IN AERO Inhalation Inhale 2 puffs into the lungs every 6 (six) hours as needed for wheezing or shortness of breath (Use prior to exercise as needed). 1 Inhaler 2  . LEVETIRACETAM 500 MG PO TABS Oral Take 500 mg by mouth at bedtime. 2 tablets at bedtime    . METFORMIN HCL ER 500 MG PO TB24 Oral Take 1 tablet (500 mg total) by mouth daily with breakfast. 30 tablet 2  . METHOTREXATE 2.5 MG PO TABS Oral Take  12.5 mg by mouth once a week. Caution:Chemotherapy. Protect from light.     Marland Kitchen METOPROLOL TARTRATE 50 MG PO TABS Oral Take 1 tablet (50 mg total) by mouth 2 (two) times daily. 60 tablet 2  . POTASSIUM CHLORIDE CRYS ER 20 MEQ PO TBCR Oral Take 1 tablet (20 mEq total) by mouth 2 (two) times daily. 60 tablet 2  . SAW PALMETTO (SERENOA REPENS) 160 MG PO CAPS Oral Take 160 mg by mouth daily.    . TESTOSTERONE CYPIONATE 200 MG/ML IM OIL Intramuscular Inject 1 mL (200 mg total) into the muscle every 14 (fourteen) days. 10 mL 1  . TADALAFIL 5 MG PO TABS Oral Take 5 mg by mouth daily as needed.        BP 120/68  Pulse 108  Temp(Src) 98.8 F (37.1 C) (Oral)  Resp 20  SpO2 100%  Physical Exam  Nursing note and vitals reviewed. Constitutional: He is oriented to person, place, and time. He appears well-developed and well-nourished.  HENT:  Head: Normocephalic and atraumatic.  Eyes: Conjunctivae are normal. Right eye exhibits no discharge. Left eye exhibits no discharge.  Neck: Normal range of motion. Neck supple.  Cardiovascular: Normal rate, regular rhythm and normal heart sounds.   Pulmonary/Chest: Effort normal and breath sounds normal.  Abdominal: Soft. Bowel sounds are normal. He exhibits no distension. There is tenderness in the periumbilical area. There is no rebound, no guarding, no CVA tenderness, no tenderness at McBurney's point and negative Murphy's sign.  Musculoskeletal: He exhibits no edema and no tenderness.  Neurological: He is alert and oriented to person, place, and time.  Skin: Skin is warm and dry.  Psychiatric: He has a normal mood and affect.    ED Course  Procedures (including critical care time)  Labs Reviewed  POCT I-STAT, CHEM 8 - Abnormal; Notable for the following:    Glucose, Bld 146 (*)    Hemoglobin 17.7 (*)    All other components within normal limits   No results found.   1. Nausea and vomiting     10:04 AM Patient seen and examined. Work-up  initiated. Will check Istat given h/o DM. Medications ordered. Fluids ordered. Pt appears well. No respiratory distress.   Vital signs reviewed and are as follows: Filed Vitals:   08/27/11 0858  BP: 120/68  Pulse: 108  Temp: 98.8 F (37.1 C)  Resp: 20   11:07 AM Blood sugar controlled, normal anion gap.  11:53 AM Patient is feeling much improved. Will give PO trial.  11:53 AM The patient was urged to return to the Emergency Department immediately with worsening of current symptoms, worsening abdominal pain, persistent vomiting, blood noted in stools, fever, or any other concerns. The patient  verbalized understanding.   12:05 PM Tolerating liquids without problem. Will d/c.   MDM  Patient with N/V.  Vitals are stable, no fever.  No signs of dehydration, tolerating PO's.  No DKA or significant hyperglycemia.  Lungs are clear.  No focal abdominal pain, no concern for appendicitis, cholecystitis, pancreatitis, ruptured viscus, UTI, kidney stone, or any other abdominal etiology.  Supportive therapy indicated with return if symptoms worsen.  Patient counseled.        Renne Crigler, [place] 08/27/11 [phone]

## 2011-08-27 NOTE — ED Notes (Signed)
Family at bedside. 

## 2011-08-27 NOTE — ED Notes (Signed)
Discharge instructions reviewed with pt; verbalizes understanding.  No questions asked; no further c/o's voiced.  Pt ambulatory to lobby.  NAD noted. 

## 2011-08-27 NOTE — ED Notes (Signed)
Pt given water to drink per EDP request.  

## 2011-08-30 NOTE — ED Provider Notes (Signed)
Medical screening examination/treatment/procedure(s) were performed by non-physician practitioner and as supervising physician I was immediately available for consultation/collaboration.    Nelia Shi, MD 08/30/11 [phone]

## 2011-09-01 ENCOUNTER — Ambulatory Visit (INDEPENDENT_AMBULATORY_CARE_PROVIDER_SITE_OTHER): Payer: Medicare Other

## 2011-09-01 DIAGNOSIS — E291 Testicular hypofunction: Secondary | ICD-10-CM

## 2011-09-01 MED ORDER — TESTOSTERONE CYPIONATE 200 MG/ML IM SOLN
200.0000 mg | INTRAMUSCULAR | Status: DC
  Administered 2011-09-01: 200 mg via INTRAMUSCULAR

## 2011-09-15 ENCOUNTER — Ambulatory Visit: Payer: Medicare Other

## 2011-09-16 ENCOUNTER — Other Ambulatory Visit: Payer: Self-pay | Admitting: Internal Medicine

## 2011-09-30 ENCOUNTER — Ambulatory Visit (INDEPENDENT_AMBULATORY_CARE_PROVIDER_SITE_OTHER): Payer: Medicare Other | Admitting: *Deleted

## 2011-09-30 DIAGNOSIS — E291 Testicular hypofunction: Secondary | ICD-10-CM

## 2011-09-30 MED ORDER — TESTOSTERONE CYPIONATE 200 MG/ML IM SOLN
200.0000 mg | INTRAMUSCULAR | Status: DC
  Administered 2011-09-30: 200 mg via INTRAMUSCULAR

## 2011-10-01 ENCOUNTER — Telehealth: Payer: Self-pay | Admitting: Pulmonary Disease

## 2011-10-01 DIAGNOSIS — J45990 Exercise induced bronchospasm: Secondary | ICD-10-CM

## 2011-10-01 MED ORDER — LEVALBUTEROL TARTRATE 45 MCG/ACT IN AERO: 2.0000 | INHALATION_SPRAY | Freq: Four times a day (QID) | RESPIRATORY_TRACT | Status: DC | PRN

## 2011-10-01 NOTE — Telephone Encounter (Signed)
Looks like he stopped the qvar already and is taking xopenex for rescue or before exercise- ? What is he really needing.  LMTCB

## 2011-10-01 NOTE — Telephone Encounter (Signed)
Spoke with pt. He states that since the weather change has had some wheezing and slight increase in SOB and so wants to have qvar refilled. He states that he had not been taking this med for a while, but had some left over but has used this up over the past couple of wks. I offered appt and he declined, stating that he prefers to keep planned June appt with [facility].  I have already refilled the xopenex Please advise on qvar 80 , thanks!

## 2011-10-02 MED ORDER — BECLOMETHASONE DIPROPIONATE 80 MCG/ACT IN AERS: INHALATION_SPRAY | RESPIRATORY_TRACT | Status: DC

## 2011-10-02 NOTE — Telephone Encounter (Signed)
Rx for qvar was sent to pharm. Spoke with pt and notified that this was done. He was advised to rinse mouth well after using inhaler.

## 2011-10-02 NOTE — Telephone Encounter (Signed)
Okay to refill script for Qvar 80 mcg two puffs twice per day.  Dispense 1 inhaler with 5 refills.  Advise him to rinse mouth after each use.

## 2011-10-05 ENCOUNTER — Ambulatory Visit (INDEPENDENT_AMBULATORY_CARE_PROVIDER_SITE_OTHER): Payer: Medicare Other | Admitting: Internal Medicine

## 2011-10-05 ENCOUNTER — Other Ambulatory Visit (INDEPENDENT_AMBULATORY_CARE_PROVIDER_SITE_OTHER): Payer: Medicare Other

## 2011-10-05 ENCOUNTER — Encounter: Payer: Self-pay | Admitting: *Deleted

## 2011-10-05 ENCOUNTER — Encounter: Payer: Self-pay | Admitting: Internal Medicine

## 2011-10-05 VITALS — BP 110/72 | HR 67 | Temp 97.9°F | Ht 75.0 in | Wt 261.0 lb

## 2011-10-05 DIAGNOSIS — E119 Type 2 diabetes mellitus without complications: Secondary | ICD-10-CM

## 2011-10-05 DIAGNOSIS — J302 Other seasonal allergic rhinitis: Secondary | ICD-10-CM

## 2011-10-05 DIAGNOSIS — I10 Essential (primary) hypertension: Secondary | ICD-10-CM

## 2011-10-05 DIAGNOSIS — E785 Hyperlipidemia, unspecified: Secondary | ICD-10-CM

## 2011-10-05 DIAGNOSIS — J309 Allergic rhinitis, unspecified: Secondary | ICD-10-CM

## 2011-10-05 DIAGNOSIS — IMO0001 Reserved for inherently not codable concepts without codable children: Secondary | ICD-10-CM

## 2011-10-05 LAB — HEMOGLOBIN A1C: Hgb A1c MFr Bld: 6.1 % (ref 4.6–6.5)

## 2011-10-05 MED ORDER — FLUTICASONE PROPIONATE 50 MCG/ACT NA SUSP: 2.0000 | Freq: Every day | NASAL | Status: DC

## 2011-10-05 NOTE — Assessment & Plan Note (Signed)
Stopped statin fall 2012 due to diarrhea - Will recheck next visit wen fasting

## 2011-10-05 NOTE — Patient Instructions (Addendum)
It was good to see you today. Test(s) ordered today. Your results will be called to you after review (48-72hours after test completion). If any changes need to be made, you will be notified at that time. Medications reviewed:resumed hctz, change in diltiazem dose, start flonase for allergy symptoms  -  Please schedule followup in 6 months for weight check and medication review, call sooner if problems. We will call you to pick up the letter for school once complete (stating your were under my care for acute flares of your chronic medical issues during Spring 2012 semester)

## 2011-10-05 NOTE — Assessment & Plan Note (Signed)
Lab Results  Component Value Date   HGBA1C 5.6 07/09/2011   Follows with endo/ellison intermittently - improved glycemic control off pred Check a1c and annual microalb/cr - titrate meds as needed On ASA - no indication for statin or ARB The current medical regimen is effective;  continue present plan and medications.

## 2011-10-05 NOTE — Progress Notes (Signed)
Subjective:    Patient ID: [name], male    DOB: 1971-02-14, 41 y.o.   MRN: [id number]  HPI  here for follow up - reviewed chronic medical issues   DM2 - much improved since off prednisone in early 2012 - prev on metformin + januvia januvia stopped and metformin decreased fall 2012 due to diarrhea AND good a1c control- checks cbgs at home 2 x/day - home cbgs 120s - no polyuria or polydipsia - previously seeing endo for same ([facility])   sarcoidosis - involves lungs, joints and ?brain/liver - has been maintained on MTX for same - off 03/2010 due to insurance issues, then temporary low dose pred, then resumed MTX 2012, off pred since resuming MTX- followed by pulm and rheum (prev [facility], now [facility]), also neuro (kirby) -  no recent arthritis symptoms    CAD - follows with [facility] for same - hosp and cath 06/2009: nonobst dx, med mgmt - no chest pain since that time - resolved ankle edema, s/p stress test 04/24/10 due to continued dyspnea on exertion> nonobstructive   hypertension - reports compliance with ongoing medical treatment. changes in medication reviewed (prev on atenolol, changed to metoprolol by pulm 12/03/09 for pulm issues). denies adverse side effects related to current therapy.     dyslipidemia - on previously statin, stopped fall 2012 due to diarrhea.    ED with hypogonadism - tried viagra, levitra and cialis in past - prefers cialis at this time - reports no interest as primary cause of problems but denies depression -  also low testosterone level dx 10/2009 - now on IM replacement q 2 weeks  Past Medical History  Diagnosis Date  . PULMONARY SARCOIDOSIS     Mediastinal lymphadenopathy with biospy proven sarcodosis  . DIABETES MELLITUS, TYPE II   . TESTICULAR HYPOFUNCTION   . HYPERLIPIDEMIA   . Gout, unspecified   . POLYNEUROPATHY   . HYPERTENSION   . CORONARY ARTERY DISEASE   . GERD   . ERECTILE DYSFUNCTION, ORGANIC   . Edema   . TIA (transient ischemic  attack)   . Arthritis     ? 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 Followed by Dr. Nickola Major  . Narcolepsy without cataplexy     MSLT 01/09/09 & MRI brain 01/09/09    Review of Systems  Constitutional: Negative for fever, fatigue and unexpected weight change.  HENT: Positive for sneezing.   Respiratory: Positive for wheezing. Negative for cough and shortness of breath.   Cardiovascular: Negative for chest pain and palpitations.  Gastrointestinal: Negative for nausea and rectal pain.       Objective:   Physical Exam  BP 110/72  Pulse 67  Temp(Src) 97.9 F (36.6 C) (Oral)  Ht 6\' 3"  (1.905 m)  Wt 261 lb (118.389 kg)  BMI 32.62 kg/m2  SpO2 96% Wt Readings from Last 3 Encounters:  10/05/11 261 lb (118.389 kg)  07/09/11 260 lb 1.9 oz (117.99 kg)  05/11/11 266 lb 12.8 oz (121.02 kg)   Constitutional:  He is well-developed and well-nourished. No distress.  Neck: Normal range of motion. Neck supple. No JVD present. No thyromegaly present.  Cardiovascular: Normal rate, regular rhythm and normal heart sounds.  No murmur heard. no BLE edema Pulmonary/Chest: Effort normal and breath sounds normal. No respiratory distress. no wheezes. Psychiatric: he has a normal mood and affect. behavior is normal. Judgment and thought content normal.   Lab Results  Component Value Date   WBC 6.3 07/09/2011  HGB 17.7* 08/27/2011   HCT 52.0 08/27/2011   PLT 268.0 07/09/2011   GLUCOSE 146* 08/27/2011   CHOL 144 01/26/2011   TRIG 90 01/26/2011   HDL 30* 01/26/2011   LDLCALC 96 01/26/2011   ALT 41 07/09/2011   AST 28 07/09/2011   NA 138 08/27/2011   K 4.2 08/27/2011   CL 100 08/27/2011   CREATININE 0.90 08/27/2011   BUN 11 08/27/2011   CO2 32 07/09/2011   TSH 0.61 07/09/2011   INR 0.99 06/24/2009   HGBA1C 5.6 07/09/2011   MICROALBUR 11.5* 07/09/2011      Assessment & Plan:  See problem list. Medications and labs reviewed today.  allergic rhinitis - add flonase and change Zyrtec to Claritin

## 2011-10-05 NOTE — Assessment & Plan Note (Signed)
BP Readings from Last 3 Encounters:  10/05/11 110/72  08/27/11 119/72  07/09/11 110/72   The current medical regimen is effective;  continue present plan and medications.

## 2011-10-10 ENCOUNTER — Other Ambulatory Visit: Payer: Self-pay | Admitting: Internal Medicine

## 2011-10-14 ENCOUNTER — Ambulatory Visit (INDEPENDENT_AMBULATORY_CARE_PROVIDER_SITE_OTHER): Payer: Medicare Other | Admitting: *Deleted

## 2011-10-14 DIAGNOSIS — E291 Testicular hypofunction: Secondary | ICD-10-CM

## 2011-10-14 MED ORDER — TESTOSTERONE CYPIONATE 200 MG/ML IM SOLN
200.0000 mg | Freq: Once | INTRAMUSCULAR | Status: AC
  Administered 2011-10-14: 200 mg via INTRAMUSCULAR

## 2011-10-28 ENCOUNTER — Ambulatory Visit (INDEPENDENT_AMBULATORY_CARE_PROVIDER_SITE_OTHER): Payer: Medicare Other | Admitting: *Deleted

## 2011-10-28 DIAGNOSIS — E291 Testicular hypofunction: Secondary | ICD-10-CM

## 2011-10-28 MED ORDER — TESTOSTERONE CYPIONATE 200 MG/ML IM SOLN
200.0000 mg | INTRAMUSCULAR | Status: DC
  Administered 2011-10-28 – 2012-02-03 (×3): 200 mg via INTRAMUSCULAR

## 2011-11-09 ENCOUNTER — Ambulatory Visit: Payer: Medicare Other | Admitting: Pulmonary Disease

## 2011-11-11 ENCOUNTER — Ambulatory Visit (INDEPENDENT_AMBULATORY_CARE_PROVIDER_SITE_OTHER): Payer: Medicare Other | Admitting: *Deleted

## 2011-11-11 DIAGNOSIS — E291 Testicular hypofunction: Secondary | ICD-10-CM

## 2011-11-11 MED ORDER — TESTOSTERONE CYPIONATE 200 MG/ML IM SOLN
200.0000 mg | Freq: Once | INTRAMUSCULAR | Status: AC
  Administered 2011-11-11: 200 mg via INTRAMUSCULAR

## 2011-11-19 ENCOUNTER — Other Ambulatory Visit: Payer: Self-pay | Admitting: Internal Medicine

## 2011-11-25 ENCOUNTER — Ambulatory Visit (INDEPENDENT_AMBULATORY_CARE_PROVIDER_SITE_OTHER): Payer: Medicare Other | Admitting: *Deleted

## 2011-11-25 DIAGNOSIS — E291 Testicular hypofunction: Secondary | ICD-10-CM

## 2011-11-25 MED ORDER — TESTOSTERONE CYPIONATE 200 MG/ML IM SOLN
200.0000 mg | Freq: Once | INTRAMUSCULAR | Status: AC
  Administered 2011-11-25: 200 mg via INTRAMUSCULAR

## 2011-12-09 ENCOUNTER — Ambulatory Visit (INDEPENDENT_AMBULATORY_CARE_PROVIDER_SITE_OTHER): Payer: Medicare Other

## 2011-12-09 ENCOUNTER — Ambulatory Visit (INDEPENDENT_AMBULATORY_CARE_PROVIDER_SITE_OTHER): Payer: Medicare Other | Admitting: Pulmonary Disease

## 2011-12-09 ENCOUNTER — Encounter: Payer: Self-pay | Admitting: Pulmonary Disease

## 2011-12-09 VITALS — BP 114/68 | HR 68 | Temp 97.9°F | Ht 75.0 in | Wt 264.8 lb

## 2011-12-09 DIAGNOSIS — D869 Sarcoidosis, unspecified: Secondary | ICD-10-CM

## 2011-12-09 DIAGNOSIS — J45990 Exercise induced bronchospasm: Secondary | ICD-10-CM

## 2011-12-09 DIAGNOSIS — N529 Male erectile dysfunction, unspecified: Secondary | ICD-10-CM

## 2011-12-09 MED ORDER — TESTOSTERONE CYPIONATE 200 MG/ML IM SOLN
200.0000 mg | Freq: Once | INTRAMUSCULAR | Status: AC
  Administered 2011-12-09: 200 mg via INTRAMUSCULAR

## 2011-12-09 NOTE — Progress Notes (Signed)
Chief Complaint  Patient presents with  . Follow-up    Patient states worse since last visit. c/o wheezing, dry cough, fatigue, night sweats, sob, chest tightness, and joint pain. Denies chest pain.    CC: Dr. Craige Cotta, Dr. Lanell Matar  History of Present Illness: [name] is a 41 y.o. male with sarcoidosis with pulmonary, neuro, and joint involvement; mild asthma.  He has lost about 30 lbs since I last saw him.  He resumed Qvar in May for cough, wheezing, and increased shortness of breath.  He is not sure if this helped much.  He was seen by rheumatology in June, and started on prednisone in addition to continuing MTX.  He was having night sweats, chest pain, joint pain, increased fatigue, and increased dyspnea.  He had fever up to 101F also.    Since starting prednisone for the past one week his fatigue, joint pain, sweats and fever have improved.  He still feels short of breath, but not as bad as before.    He still gets wheeze from his chest and cough.  He has been hoarse, but denies sputum.  He does not have sinus congestion or reflux.  He had chest xray with rheumatology, and told this was okay.   Past Medical History  Diagnosis Date  . PULMONARY SARCOIDOSIS     Mediastinal lymphadenopathy with biospy proven sarcodosis  . DIABETES MELLITUS, TYPE II   . TESTICULAR HYPOFUNCTION   . HYPERLIPIDEMIA   . Gout, unspecified   . POLYNEUROPATHY   . HYPERTENSION   . CORONARY ARTERY DISEASE   . GERD   . ERECTILE DYSFUNCTION, ORGANIC   . Edema   . TIA (transient ischemic attack)   . Arthritis     ? 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 Followed by Dr. Nickola Major  . Narcolepsy without cataplexy     MSLT 01/09/09 & MRI brain 01/09/09    Past Surgical History  Procedure Date  . Bronchoscopy 08/21/08  . Mediastinoscopy 11/30/08    Current Outpatient Prescriptions on File Prior to Visit  Medication Sig Dispense Refill  . allopurinol (ZYLOPRIM) 300 MG tablet Take 300 mg by mouth  daily.      Marland Kitchen aspirin 325 MG tablet Take 325 mg by mouth daily.        . beclomethasone (QVAR) 80 MCG/ACT inhaler 2 puffs and rinse mouth well twice daily  1 Inhaler  5  . COLCRYS 0.6 MG tablet Take 0.6 mg by mouth daily.       Marland Kitchen diltiazem (CARDIZEM CD) 120 MG 24 hr capsule Take 1 by mouth twice a day      . fluticasone (FLONASE) 50 MCG/ACT nasal spray Place 2 sprays into the nose daily.  16 g  2  . folic acid (FOLVITE) 1 MG tablet Take 1 mg by mouth daily.        . hydrochlorothiazide (HYDRODIURIL) 25 MG tablet Take 1 tablet (25 mg total) by mouth daily.  30 tablet  11  . levalbuterol (XOPENEX HFA) 45 MCG/ACT inhaler Inhale 2 puffs into the lungs every 6 (six) hours as needed for wheezing or shortness of breath (Use prior to exercise as needed).  1 Inhaler  1  . levETIRAcetam (KEPPRA) 500 MG tablet Take 2 tablets (1,000 mg total) by mouth at bedtime.      . metFORMIN (GLUCOPHAGE-XR) 500 MG 24 hr tablet Take 1 tablet (500 mg total) by mouth daily with breakfast.  30 tablet  2  . methotrexate (RHEUMATREX) 2.5  MG tablet Take 12.5 mg by mouth once a week. Caution:Chemotherapy. Protect from light.       . metoprolol (LOPRESSOR) 50 MG tablet TAKE ONE TABLET BY MOUTH TWICE DAILY  60 tablet  5  . potassium chloride SA (K-DUR,KLOR-CON) 20 MEQ tablet TAKE ONE TABLET BY MOUTH TWICE DAILY  60 tablet  5  . saw palmetto 160 MG capsule Take 160 mg by mouth daily.      . tadalafil (CIALIS) 5 MG tablet Take 5 mg by mouth daily as needed.        . testosterone cypionate (DEPO-TESTOSTERONE) 200 MG/ML injection Inject 1 mL (200 mg total) into the muscle every 14 (fourteen) days.  10 mL  1  . DISCONTD: metFORMIN (GLUCOPHAGE-XR) 500 MG 24 hr tablet TAKE ONE TABLET BY MOUTH TWICE DAILY  60 tablet  5   Current Facility-Administered Medications on File Prior to Visit  Medication Dose Route Frequency Provider Last Rate Last Dose  . testosterone cypionate (DEPOTESTOTERONE CYPIONATE) injection 200 mg  200 mg Intramuscular  Q14 Days Newt Lukes, MD   200 mg at 10/28/11 0945    Allergies  Allergen Reactions  . Sulfonamide Derivatives     REACTION: rash and fever    Physical Exam:  Blood pressure 114/68, pulse 68, temperature 97.9 F (36.6 C), temperature source Oral, height 6\' 3"  (1.905 m), weight 264 lb 12.8 oz (120.112 kg), SpO2 98.00%. Body mass index is 33.10 kg/(m^2).  Wt Readings from Last 3 Encounters:  12/09/11 264 lb 12.8 oz (120.112 kg)  10/05/11 261 lb (118.389 kg)  07/09/11 260 lb 1.9 oz (117.99 kg)    General - Healthy HEENT - no sinus tenderness, no oral exudate, no LAN Cardiac - s1s2 Chest - no wheeze/rales Abdomen - soft, nontender Extremities - no edema Skin - no rashes Neurologic - normal strength Psychiatric - normal mood, behavior  Spirometry 01/14/11>>FEV1 3.15 (78%), FVC 3.68 (72%), FEV1% 86 Spirometry 12/09/11>>FEV1 3.25 (80%), FVC 3.85 (76%), FEV1% 84   Assessment/Plan:  Outpatient Encounter Prescriptions as of 12/09/2011  Medication Sig Dispense Refill  . allopurinol (ZYLOPRIM) 300 MG tablet Take 300 mg by mouth daily.      Marland Kitchen aspirin 325 MG tablet Take 325 mg by mouth daily.        . beclomethasone (QVAR) 80 MCG/ACT inhaler 2 puffs and rinse mouth well twice daily  1 Inhaler  5  . COLCRYS 0.6 MG tablet Take 0.6 mg by mouth daily.       Marland Kitchen diltiazem (CARDIZEM CD) 120 MG 24 hr capsule Take 1 by mouth twice a day      . fluticasone (FLONASE) 50 MCG/ACT nasal spray Place 2 sprays into the nose daily.  16 g  2  . folic acid (FOLVITE) 1 MG tablet Take 1 mg by mouth daily.        . hydrochlorothiazide (HYDRODIURIL) 25 MG tablet Take 1 tablet (25 mg total) by mouth daily.  30 tablet  11  . levalbuterol (XOPENEX HFA) 45 MCG/ACT inhaler Inhale 2 puffs into the lungs every 6 (six) hours as needed for wheezing or shortness of breath (Use prior to exercise as needed).  1 Inhaler  1  . levETIRAcetam (KEPPRA) 500 MG tablet Take 2 tablets (1,000 mg total) by mouth at bedtime.       . metFORMIN (GLUCOPHAGE-XR) 500 MG 24 hr tablet Take 1 tablet (500 mg total) by mouth daily with breakfast.  30 tablet  2  . methotrexate (RHEUMATREX) 2.5  MG tablet Take 12.5 mg by mouth once a week. Caution:Chemotherapy. Protect from light.       . metoprolol (LOPRESSOR) 50 MG tablet TAKE ONE TABLET BY MOUTH TWICE DAILY  60 tablet  5  . potassium chloride SA (K-DUR,KLOR-CON) 20 MEQ tablet TAKE ONE TABLET BY MOUTH TWICE DAILY  60 tablet  5  . saw palmetto 160 MG capsule Take 160 mg by mouth daily.      . tadalafil (CIALIS) 5 MG tablet Take 5 mg by mouth daily as needed.        . testosterone cypionate (DEPO-TESTOSTERONE) 200 MG/ML injection Inject 1 mL (200 mg total) into the muscle every 14 (fourteen) days.  10 mL  1  . predniSONE (DELTASONE) 10 MG tablet Taper as directed      . DISCONTD: metFORMIN (GLUCOPHAGE-XR) 500 MG 24 hr tablet TAKE ONE TABLET BY MOUTH TWICE DAILY  60 tablet  5   Facility-Administered Encounter Medications as of 12/09/2011  Medication Dose Route Frequency Provider Last Rate Last Dose  . testosterone cypionate (DEPOTESTOTERONE CYPIONATE) injection 200 mg  200 mg Intramuscular Q14 Days Newt Lukes, MD   200 mg at 10/28/11 0945  . testosterone cypionate (DEPOTESTOTERONE CYPIONATE) injection 200 mg  200 mg Intramuscular Once Newt Lukes, MD   200 mg at 12/09/11 0914    Christiana Gurevich Pager:  [phone] 12/09/2011, 3:58 PM

## 2011-12-09 NOTE — Patient Instructions (Signed)
Will call with results of chest xray Can try to decrease dose of Qvar as tolerated after finishing course of prednisone Follow up in 6 months

## 2011-12-11 ENCOUNTER — Telehealth: Payer: Self-pay | Admitting: Pulmonary Disease

## 2011-12-11 DIAGNOSIS — J45909 Unspecified asthma, uncomplicated: Secondary | ICD-10-CM

## 2011-12-11 MED ORDER — BUDESONIDE-FORMOTEROL FUMARATE 160-4.5 MCG/ACT IN AERO: 2.0000 | INHALATION_SPRAY | Freq: Two times a day (BID) | RESPIRATORY_TRACT | Status: DC

## 2011-12-11 NOTE — Assessment & Plan Note (Signed)
I have advised him to continue Qvar until he is tapered off prednisone>>prednisone therapy may be masking otherwise potential benefit from inhaled steroids.  He can then gradual decrease amount he is using Qvar as tolerated until he is either off Qvar, or he notices increased respiratory symptoms.  Advised that he can use xopenex more often if needed.

## 2011-12-11 NOTE — Telephone Encounter (Signed)
CXR report 12/02/11 from [facility] hospital:  Mild bronchial wall thickening, no evidence for pulmonary sarcoidosis recurrence.  Explained that this can occur with asthma.  He does not feel Qvar is helping.  Will send script for symbicort 160/4.5 two puffs bid.  Advised him to rinse mouth after each use.  Also advised that he should stop qvar while using symbicort.  He can continue prn xopenex.  Advised to call back in few weeks if breathing no better.

## 2011-12-11 NOTE — Assessment & Plan Note (Signed)
He has recurrent respiratory symptoms, and reports some subjective improvement after addition of prednisone by rheumatology.  His clinical exam today is unremarkable, and no significant change in spirometry.  He reports recent chest xray was unremarkable.  I have requested copy of this chest xray>>will call him with results, and determine if additional interventions are needed.  He is to continue prednisone taper per rheumatology.

## 2011-12-23 ENCOUNTER — Ambulatory Visit (INDEPENDENT_AMBULATORY_CARE_PROVIDER_SITE_OTHER): Payer: Medicare Other

## 2011-12-23 DIAGNOSIS — E291 Testicular hypofunction: Secondary | ICD-10-CM

## 2011-12-23 MED ORDER — GLUCOSE BLOOD VI STRP: 1.0000 | ORAL_STRIP | Freq: Every day | Status: DC

## 2011-12-23 MED ORDER — TESTOSTERONE CYPIONATE 200 MG/ML IM SOLN: 200.0000 mg | Freq: Once | INTRAMUSCULAR | Status: DC

## 2012-01-01 ENCOUNTER — Telehealth: Payer: Self-pay | Admitting: Pulmonary Disease

## 2012-01-01 DIAGNOSIS — D869 Sarcoidosis, unspecified: Secondary | ICD-10-CM

## 2012-01-01 NOTE — Telephone Encounter (Signed)
Per 7.3.13 ov note:  Patient Instructions     Will call with results of chest xray Can try to decrease dose of Qvar as tolerated after finishing course of prednisone Follow up in 6 months      cxr done 6.26.13 showed (was external report so unable to copy and paste):  mild bronchial wall thickening, not no radiographic evidence of pulmonary sarcoidosis.  Chest CT would be a more sensitive examination.  LMOM TCB x1.

## 2012-01-04 MED ORDER — LEVALBUTEROL HCL 0.63 MG/3ML IN NEBU: 1.0000 | INHALATION_SOLUTION | Freq: Four times a day (QID) | RESPIRATORY_TRACT | Status: DC | PRN

## 2012-01-04 MED ORDER — BUDESONIDE 0.25 MG/2ML IN SUSP: RESPIRATORY_TRACT | Status: DC

## 2012-01-04 NOTE — Telephone Encounter (Signed)
lmomtcb  

## 2012-01-04 NOTE — Telephone Encounter (Signed)
Returning call.

## 2012-01-04 NOTE — Telephone Encounter (Signed)
Discontinue symbicort.  Send order for pulmicort 0.25 mg nebulized bid, dispense 60 vials with 5 refills.  Send order for xopenex 0.63 mg nebulized q6h prn.  Dispense 120 vials with 5 refills.  Please ensure patient has home nebulizer.  If not, then send order for this.

## 2012-01-04 NOTE — Telephone Encounter (Signed)
lmomtcb x1 at both #'s listed 

## 2012-01-04 NOTE — Telephone Encounter (Signed)
I spoke with pt and he states he can not tell a difference in his breathing being on the symbicort 160. He states his asthma attacks have increased and during the day this happens about 3-4 times. He is having to use his xopenex inhaler 5 times a day. Due to him using the xopenex this many times a day he is beginning to have palpitations. He is wanting to know if he can be put on nebulizer meds instead. Please advise Dr. Craige Cotta, thanks  Allergies  Allergen Reactions  . Sulfonamide Derivatives     REACTION: rash and fever

## 2012-01-04 NOTE — Telephone Encounter (Signed)
Pt triage's call & asked to be reached at [phone] or [phone].  Antionette Fairy

## 2012-01-04 NOTE — Telephone Encounter (Signed)
Pt is aware of VS recs. He stated he did not have a nebulizer at home. I have placed order for this and have sent rx's to the pharmacy for pt. He voiced his understanding regarding directions

## 2012-01-05 ENCOUNTER — Telehealth: Payer: Self-pay | Admitting: Pulmonary Disease

## 2012-01-05 NOTE — Telephone Encounter (Signed)
Patient calling stating he was just wanting to know what was going on since he hasn't heard anything from the home health company about his Neb machine.  Informed patient that according to last phone message on 01/04/12 machine and meds were just set up and sometimes it takes a few days for machine to be shipped out to home.  Also reinforced to patient that meds rx for nebulilzer were sent to pharmacy and that his home health would set him up to use machine properly. Informed patient that after a few days he still has any concerns just give [place] a call back.  Patient verbalized understanding and nothing further was needed at this time.

## 2012-01-06 ENCOUNTER — Ambulatory Visit (INDEPENDENT_AMBULATORY_CARE_PROVIDER_SITE_OTHER): Payer: Medicare Other

## 2012-01-06 ENCOUNTER — Other Ambulatory Visit: Payer: Self-pay

## 2012-01-06 DIAGNOSIS — E291 Testicular hypofunction: Secondary | ICD-10-CM

## 2012-01-06 MED ORDER — TESTOSTERONE CYPIONATE 200 MG/ML IM SOLN
200.0000 mg | INTRAMUSCULAR | Status: DC
  Administered 2012-01-06: 200 mg via INTRAMUSCULAR

## 2012-01-06 MED ORDER — ALLOPURINOL 300 MG PO TABS: 300.0000 mg | ORAL_TABLET | Freq: Every day | ORAL | Status: DC

## 2012-01-20 ENCOUNTER — Other Ambulatory Visit: Payer: Self-pay | Admitting: *Deleted

## 2012-01-20 ENCOUNTER — Ambulatory Visit (INDEPENDENT_AMBULATORY_CARE_PROVIDER_SITE_OTHER): Payer: Medicare Other | Admitting: *Deleted

## 2012-01-20 DIAGNOSIS — E291 Testicular hypofunction: Secondary | ICD-10-CM

## 2012-01-20 MED ORDER — HYDROCHLOROTHIAZIDE 25 MG PO TABS: 25.0000 mg | ORAL_TABLET | Freq: Every day | ORAL | Status: DC

## 2012-01-20 MED ORDER — TESTOSTERONE CYPIONATE 200 MG/ML IM SOLN: 200.0000 mg | INTRAMUSCULAR | Status: DC

## 2012-01-20 MED ORDER — TESTOSTERONE CYPIONATE 200 MG/ML IM SOLN
200.0000 mg | Freq: Once | INTRAMUSCULAR | Status: AC
  Administered 2012-01-20: 200 mg via INTRAMUSCULAR

## 2012-01-20 NOTE — Telephone Encounter (Signed)
Faxed script back to [facility]... 01/20/12@1 :48pm/LMB

## 2012-01-26 ENCOUNTER — Other Ambulatory Visit: Payer: Self-pay | Admitting: *Deleted

## 2012-01-26 NOTE — Telephone Encounter (Signed)
Received fax pt needing PA for his testosterone. Contacted insurance faxing over PA form to be completed... 01/26/12@11 :36am/LMB

## 2012-01-26 NOTE — Telephone Encounter (Signed)
Received PA completed form fax back to insurance waiting on approval status... 01/26/12@11 :49am/LMB

## 2012-01-27 ENCOUNTER — Telehealth: Payer: Self-pay | Admitting: Internal Medicine

## 2012-01-27 NOTE — Telephone Encounter (Signed)
Have received approval letter. Pharmacy already notified... 01/27/12@2 :29pm/LMB

## 2012-01-27 NOTE — Telephone Encounter (Signed)
Received PA back med has been approved. Contacted pharmacy spoke with aniya gave approval status... 01/27/12@2 :22pm/LMB

## 2012-02-03 ENCOUNTER — Ambulatory Visit (INDEPENDENT_AMBULATORY_CARE_PROVIDER_SITE_OTHER): Payer: Medicare Other | Admitting: General Practice

## 2012-02-03 DIAGNOSIS — E291 Testicular hypofunction: Secondary | ICD-10-CM

## 2012-02-06 ENCOUNTER — Other Ambulatory Visit: Payer: Self-pay | Admitting: Internal Medicine

## 2012-02-15 LAB — HM DIABETES EYE EXAM

## 2012-02-17 ENCOUNTER — Encounter: Payer: Self-pay | Admitting: Endocrinology

## 2012-02-17 ENCOUNTER — Ambulatory Visit (INDEPENDENT_AMBULATORY_CARE_PROVIDER_SITE_OTHER): Payer: Medicare Other | Admitting: *Deleted

## 2012-02-17 DIAGNOSIS — E291 Testicular hypofunction: Secondary | ICD-10-CM

## 2012-02-17 DIAGNOSIS — Z23 Encounter for immunization: Secondary | ICD-10-CM

## 2012-02-17 MED ORDER — TESTOSTERONE CYPIONATE 200 MG/ML IM SOLN
200.0000 mg | Freq: Once | INTRAMUSCULAR | Status: AC
  Administered 2012-02-17: 200 mg via INTRAMUSCULAR

## 2012-03-02 ENCOUNTER — Ambulatory Visit (INDEPENDENT_AMBULATORY_CARE_PROVIDER_SITE_OTHER): Payer: Medicare Other

## 2012-03-02 DIAGNOSIS — E291 Testicular hypofunction: Secondary | ICD-10-CM

## 2012-03-02 MED ORDER — TESTOSTERONE CYPIONATE 200 MG/ML IM SOLN
200.0000 mg | Freq: Once | INTRAMUSCULAR | Status: AC
  Administered 2012-03-02: 200 mg via INTRAMUSCULAR

## 2012-03-16 ENCOUNTER — Ambulatory Visit (INDEPENDENT_AMBULATORY_CARE_PROVIDER_SITE_OTHER): Payer: Medicare Other | Admitting: *Deleted

## 2012-03-16 DIAGNOSIS — Z23 Encounter for immunization: Secondary | ICD-10-CM

## 2012-04-04 ENCOUNTER — Encounter: Payer: Self-pay | Admitting: Internal Medicine

## 2012-04-04 ENCOUNTER — Other Ambulatory Visit (INDEPENDENT_AMBULATORY_CARE_PROVIDER_SITE_OTHER): Payer: Medicare Other

## 2012-04-04 ENCOUNTER — Ambulatory Visit (INDEPENDENT_AMBULATORY_CARE_PROVIDER_SITE_OTHER): Payer: Medicare Other | Admitting: Internal Medicine

## 2012-04-04 VITALS — BP 112/72 | HR 69 | Temp 97.8°F | Ht 75.0 in | Wt 263.2 lb

## 2012-04-04 DIAGNOSIS — I10 Essential (primary) hypertension: Secondary | ICD-10-CM

## 2012-04-04 DIAGNOSIS — E785 Hyperlipidemia, unspecified: Secondary | ICD-10-CM

## 2012-04-04 DIAGNOSIS — D869 Sarcoidosis, unspecified: Secondary | ICD-10-CM

## 2012-04-04 DIAGNOSIS — E119 Type 2 diabetes mellitus without complications: Secondary | ICD-10-CM

## 2012-04-04 LAB — LIPID PANEL
Cholesterol: 189 mg/dL (ref 0–200)
HDL: 34.6 mg/dL — ABNORMAL LOW (ref >39.00)
LDL Cholesterol: 133 mg/dL — ABNORMAL HIGH (ref 0–99)
Total CHOL/HDL Ratio: 5
Triglycerides: 109 mg/dL (ref 0.0–149.0)
VLDL: 21.8 mg/dL (ref 0.0–40.0)

## 2012-04-04 LAB — HEMOGLOBIN A1C: Hgb A1c MFr Bld: 6.4 % (ref 4.6–6.5)

## 2012-04-04 NOTE — Assessment & Plan Note (Signed)
Stopped statin fall 2012 due to diarrhea - Recheck now and reconsider tx if needed given DM, CAD

## 2012-04-04 NOTE — Progress Notes (Signed)
Subjective:    Patient ID: [name], male    DOB: 1970/06/13, 41 y.o.   MRN: [id number]  HPI  here for follow up - reviewed chronic medical issues   DM2 - much improved since off prednisone in early 2012 - januvia stopped and metformin decreased fall 2012 due to diarrhea AND good a1c control; checks cbgs at home 2 x/day - home cbgs 120s - no polyuria or polydipsia - previously seeing endo for same ([facility]), now only following with PCP   sarcoidosis - involves lungs, joints and ?brain/liver - has been maintained on MTX for same - off 03/2010 due to insurance issues, then temporary low dose pred, then resumed MTX 2012, off pred since resuming MTX- followed by pulm and rheum (prev [facility], now [facility]ra), also neuro ([facility]) -  no recent arthritis symptoms    CAD - follows with [facility] for same - hosp and cath 06/2009: nonobst dx, med mgmt - no chest pain since that time - resolved ankle edema, s/p stress test 04/24/10 due to continued dyspnea on exertion> no ischemia   hypertension - reports compliance with ongoing medical treatment. changes in medication reviewed (prev on atenolol, changed to metoprolol by pulm 12/03/09 for pulm issues). denies adverse side effects related to current therapy.     dyslipidemia - on previously statin, stopped fall 2012 due to diarrhea.    ED with hypogonadism - tried viagra, levitra and cialis in past - prefers cialis at this time - reports no interest as primary cause of problems but denies depression -  also low testosterone level dx 10/2009 - now on IM replacement q 2 weeks  Past Medical History  Diagnosis Date  . PULMONARY SARCOIDOSIS     Mediastinal lymphadenopathy with biospy proven sarcodosis  . DIABETES MELLITUS, TYPE II   . TESTICULAR HYPOFUNCTION   . HYPERLIPIDEMIA   . Gout, unspecified   . POLYNEUROPATHY   . HYPERTENSION   . CORONARY ARTERY DISEASE   . GERD   . ERECTILE DYSFUNCTION, ORGANIC   . Edema   . TIA (transient ischemic attack)    . Arthritis     ? 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 Followed by Dr. Nickola Major  . Narcolepsy without cataplexy     MSLT 01/09/09 & MRI brain 01/09/09    Review of Systems  Constitutional: Negative for fever, fatigue and unexpected weight change.  Respiratory: Negative for cough and shortness of breath.   Cardiovascular: Negative for chest pain and palpitations.  Gastrointestinal: Negative for nausea and rectal pain.       Objective:   Physical Exam  BP 112/72  Pulse 69  Temp 97.8 F (36.6 C) (Oral)  Ht 6\' 3"  (1.905 m)  Wt 263 lb 3.2 oz (119.387 kg)  BMI 32.90 kg/m2  SpO2 96% Wt Readings from Last 3 Encounters:  04/04/12 263 lb 3.2 oz (119.387 kg)  12/09/11 264 lb 12.8 oz (120.112 kg)  10/05/11 261 lb (118.389 kg)   Constitutional:  He is overweight, but well-developed and well-nourished. No distress.  Neck: Normal range of motion. Neck supple. No JVD present. No thyromegaly present.  Cardiovascular: Normal rate, regular rhythm and normal heart sounds.  No murmur heard. no BLE edema Pulmonary/Chest: Effort normal and breath sounds normal. No respiratory distress. no wheezes. Psychiatric: he has a normal mood and affect. behavior is normal. Judgment and thought content normal.   Lab Results  Component Value Date   WBC 6.3 07/09/2011   HGB 17.7* 08/27/2011  HCT 52.0 08/27/2011   PLT 268.0 07/09/2011   GLUCOSE 146* 08/27/2011   CHOL 144 01/26/2011   TRIG 90 01/26/2011   HDL 30* 01/26/2011   LDLCALC 96 01/26/2011   ALT 41 07/09/2011   AST 28 07/09/2011   NA 138 08/27/2011   K 4.2 08/27/2011   CL 100 08/27/2011   CREATININE 0.90 08/27/2011   BUN 11 08/27/2011   CO2 32 07/09/2011   TSH 0.61 07/09/2011   INR 0.99 06/24/2009   HGBA1C 6.1 10/05/2011   MICROALBUR 11.5* 07/09/2011      Assessment & Plan:  See problem list. Medications and labs reviewed today.

## 2012-04-04 NOTE — Assessment & Plan Note (Signed)
BP Readings from Last 3 Encounters:  04/04/12 112/72  12/09/11 114/68  10/05/11 110/72   The current medical regimen is effective;  continue present plan and medications.

## 2012-04-04 NOTE — Assessment & Plan Note (Signed)
Follows with rheum (mirsha - [facility]) and pulm (sood - [facility]) and neuro (kirby -[facility]) for same - symptoms controlled and stable on MTX - used pred only when unable to afford MTX with insurance issues The current medical regimen is effective;  continue present plan and medications.  

## 2012-04-04 NOTE — Patient Instructions (Signed)
It was good to see you today. Test(s) ordered today. Your results will be released to MyChart (or called to you) after review, usually within 72hours after test completion. If any changes need to be made, you will be notified at that same time. Medications reviewed and updated, no recommended change today  -  Please schedule followup in 6 months for weight check and medication review, call sooner if problems.

## 2012-04-04 NOTE — Assessment & Plan Note (Signed)
Lab Results  Component Value Date   HGBA1C 6.1 10/05/2011   Follows with endo/ellison intermittently - improved glycemic control off pred Check a1c and annual microalb/cr - titrate meds as needed Off januvia fall 2012 and decreased dose metformin fall 2012 due to good a1c control On ASA -  declines statin or ARB The current medical regimen is effective;  continue present plan and medications.

## 2012-04-11 ENCOUNTER — Telehealth: Payer: Self-pay | Admitting: Internal Medicine

## 2012-04-11 MED ORDER — AMOXICILLIN-POT CLAVULANATE 875-125 MG PO TABS: 1.0000 | ORAL_TABLET | Freq: Two times a day (BID) | ORAL | Status: DC

## 2012-04-11 NOTE — Telephone Encounter (Signed)
Patient calling, has a sinus headache, green nasal discharge and a sore throat.  Has had sx 10/31.  No fever.  No cough.  Asking for antibiotics to be called in. The h/a worsens when he bends down "feels like it is going to explode".  Tylenol and Motrin are not relieving the h/a pains.    Offered this afternoon appt.  States that he cannot come in.  Can come in on Tuesday 11/5. Please advise.

## 2012-04-11 NOTE — Telephone Encounter (Signed)
Augmentin antibiotics twice a day for 10 days to treat sinus infection symptoms, ex done

## 2012-04-12 NOTE — Telephone Encounter (Signed)
Pt advised of Rx/pharmacy 

## 2012-04-25 ENCOUNTER — Other Ambulatory Visit: Payer: Self-pay | Admitting: Internal Medicine

## 2012-05-26 ENCOUNTER — Ambulatory Visit: Payer: Medicare Other | Admitting: Pulmonary Disease

## 2012-06-15 ENCOUNTER — Other Ambulatory Visit: Payer: Self-pay | Admitting: Internal Medicine

## 2012-06-15 MED ORDER — TESTOSTERONE CYPIONATE 200 MG/ML IM SOLN: 200.0000 mg | INTRAMUSCULAR | Status: DC

## 2012-06-15 NOTE — Telephone Encounter (Signed)
F[facility]ed [facility] to [facility]...lmb 

## 2012-06-21 ENCOUNTER — Ambulatory Visit: Payer: Medicare Other | Admitting: Pulmonary Disease

## 2012-07-08 ENCOUNTER — Encounter: Payer: Self-pay | Admitting: Pulmonary Disease

## 2012-07-08 ENCOUNTER — Ambulatory Visit (INDEPENDENT_AMBULATORY_CARE_PROVIDER_SITE_OTHER): Payer: Medicare Other | Admitting: Pulmonary Disease

## 2012-07-08 VITALS — BP 104/60 | HR 66 | Temp 97.5°F | Ht 75.0 in | Wt 266.0 lb

## 2012-07-08 DIAGNOSIS — D869 Sarcoidosis, unspecified: Secondary | ICD-10-CM

## 2012-07-08 DIAGNOSIS — J45990 Exercise induced bronchospasm: Secondary | ICD-10-CM

## 2012-07-08 MED ORDER — LEVALBUTEROL TARTRATE 45 MCG/ACT IN AERO: 2.0000 | INHALATION_SPRAY | Freq: Four times a day (QID) | RESPIRATORY_TRACT | Status: DC | PRN

## 2012-07-08 MED ORDER — BUDESONIDE 0.25 MG/2ML IN SUSP: RESPIRATORY_TRACT | Status: DC

## 2012-07-08 NOTE — Patient Instructions (Signed)
Will get chest xray report from Dr. Jacky Kindle office if [facility] Follow up in 1 year Call if help needed sooner

## 2012-07-08 NOTE — Progress Notes (Signed)
Chief Complaint  Patient presents with  . Sarcoidosis    Breathing is unchanged. Reports slight SOB and wheezing at times, chest tightness and a dry cough.    CC: Dr. Lanell [name], Dr. Craige [name]  History of Present Illness: [name] is a 42 y.o. male with pulmonary/neuro/joint sarcoidosis, and mild/persistent asthma.  He ran out of pulmicort about two months ago.  He has been getting more wheezing since.  He uses xopenex twice per day, and this helps.  He is keeping up with his activities.  He denies chest pain.  He gets occasional hip pains.  He denies skin rash or leg swelling.  He has not been having fever or sweats.    TESTS: Spirometry 01/14/11>>FEV1 3.15 (78%), FVC 3.68 (72%), FEV1% 86  Spirometry 12/09/11>>FEV1 3.25 (80%), FVC 3.85 (76%), FEV1% 84   Past Medical History  Diagnosis Date  . PULMONARY SARCOIDOSIS     Mediastinal lymphadenopathy with biospy proven sarcodosis  . DIABETES MELLITUS, TYPE II   . TESTICULAR HYPOFUNCTION   . HYPERLIPIDEMIA   . Gout, unspecified   . POLYNEUROPATHY   . HYPERTENSION   . CORONARY ARTERY DISEASE   . GERD   . ERECTILE DYSFUNCTION, ORGANIC   . Edema   . TIA (transient ischemic attack)   . Arthritis     ? 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 Followed by Dr. Nickola [name]  . Narcolepsy without cataplexy     MSLT 01/09/09 & MRI brain 01/09/09    Past Surgical History  Procedure Date  . Bronchoscopy 08/21/08  . Mediastinoscopy 11/30/08    Outpatient Encounter Prescriptions as of 07/08/2012  Medication Sig Dispense Refill  . allopurinol (ZYLOPRIM) 300 MG tablet Take 1 tablet (300 mg total) by mouth daily.  30 tablet  5  . aspirin 325 MG tablet Take 325 mg by mouth daily.        . budesonide (PULMICORT) 0.25 MG/2ML nebulizer solution 1 vial twice a day. Dx: 135  60 mL  5  . budesonide-formoterol (SYMBICORT) 160-4.5 MCG/ACT inhaler Inhale 2 puffs into the lungs 2 (two) times daily.  1 Inhaler  6  . COLCRYS 0.6 MG tablet Take 0.6 mg by  mouth daily.       Marland Kitchen diltiazem (CARDIZEM CD) 120 MG 24 hr capsule Take 1 by mouth twice a day      . fluticasone (FLONASE) 50 MCG/ACT nasal spray Place 2 sprays into the nose daily.  16 g  2  . folic acid (FOLVITE) 1 MG tablet Take 1 mg by mouth daily.        Marland Kitchen glucose blood (ONE TOUCH ULTRA TEST) test strip 1 each by Other route daily. Use as instructed  100 each  1  . hydrochlorothiazide (HYDRODIURIL) 25 MG tablet Take 1 tablet (25 mg total) by mouth daily.  30 tablet  5  . levalbuterol (XOPENEX HFA) 45 MCG/ACT inhaler Inhale 2 puffs into the lungs every 6 (six) hours as needed for wheezing or shortness of breath (Use prior to exercise as needed).  1 Inhaler  1  . levalbuterol (XOPENEX) 0.63 MG/3ML nebulizer solution Take 3 mLs (0.63 mg total) by nebulization every 6 (six) hours as needed. Dx. 135  120 mL  5  . levETIRAcetam (KEPPRA) 500 MG tablet Take 2 tablets (1,000 mg total) by mouth at bedtime.      . metFORMIN (GLUCOPHAGE-XR) 500 MG 24 hr tablet TAKE 1 TABLET TWICE DAILY  60 tablet  5  . methotrexate (RHEUMATREX) 2.5 MG  tablet Take 12.5 mg by mouth once a week. Caution:Chemotherapy. Protect from light.       . metoprolol (LOPRESSOR) 50 MG tablet TAKE ONE TABLET BY MOUTH TWICE DAILY  60 tablet  5  . potassium chloride SA (K-DUR,KLOR-CON) 20 MEQ tablet TAKE ONE TABLET BY MOUTH TWICE DAILY  60 tablet  5  . saw palmetto 160 MG capsule Take 160 mg by mouth daily.      . tadalafil (CIALIS) 5 MG tablet Take 5 mg by mouth daily as needed.        . testosterone cypionate (DEPO-TESTOSTERONE) 200 MG/ML injection Inject 1 mL (200 mg total) into the muscle every 14 (fourteen) days.  10 mL  1    Allergies  Allergen Reactions  . Sulfonamide Derivatives     REACTION: rash and fever    Physical Exam:  Filed Vitals:   07/08/12 1447  BP: 104/60  Pulse: 66  Temp: 97.5 F (36.4 C)  TempSrc: Oral  Height: 6\' 3"  (1.905 m)  Weight: 266 lb (120.657 kg)  SpO2: 100%     Current Encounter SPO2   07/08/12 1447 100%  04/04/12 0907 96%  12/09/11 1540 98%     Body mass index is 33.25 kg/(m^2).   Wt Readings from Last 2 Encounters:  07/08/12 266 lb (120.657 kg)  04/04/12 263 lb 3.2 oz (119.387 kg)     General - No distress ENT - No sinus tenderness, no oral exudate, no LAN Cardiac - s1s2 regular, no murmur Chest - No wheeze/rales/dullness Back - No focal tenderness Abd - Soft, non-tender Ext - No edema Neuro - Normal strength Skin - No rashes Psych - normal mood, and behavior   Assessment/Plan:  Xavier Helling, MD  Pulmonary/Critical Care/Sleep Pager:  [phone] 07/08/2012, 2:50 PM

## 2012-07-08 NOTE — Assessment & Plan Note (Signed)
Clinically stable.  He is on MTX per rheumatology.  Will get copy of his recent chest xray from his rheumatologist in [place].

## 2012-07-08 NOTE — Assessment & Plan Note (Signed)
Will renew pulmicort.  He is to continue prn xopenex.

## 2012-07-22 ENCOUNTER — Other Ambulatory Visit: Payer: Self-pay | Admitting: Internal Medicine

## 2012-08-18 ENCOUNTER — Other Ambulatory Visit: Payer: Self-pay | Admitting: Internal Medicine

## 2012-09-11 ENCOUNTER — Encounter: Payer: Self-pay | Admitting: *Deleted

## 2012-09-15 ENCOUNTER — Encounter: Payer: Self-pay | Admitting: Cardiovascular Disease

## 2012-09-29 ENCOUNTER — Ambulatory Visit (INDEPENDENT_AMBULATORY_CARE_PROVIDER_SITE_OTHER): Payer: Medicare Other | Admitting: Internal Medicine

## 2012-09-29 ENCOUNTER — Other Ambulatory Visit (INDEPENDENT_AMBULATORY_CARE_PROVIDER_SITE_OTHER): Payer: Medicare Other

## 2012-09-29 ENCOUNTER — Encounter: Payer: Self-pay | Admitting: Internal Medicine

## 2012-09-29 VITALS — BP 112/78 | HR 74 | Temp 97.8°F | Wt 260.1 lb

## 2012-09-29 DIAGNOSIS — E291 Testicular hypofunction: Secondary | ICD-10-CM

## 2012-09-29 DIAGNOSIS — E785 Hyperlipidemia, unspecified: Secondary | ICD-10-CM

## 2012-09-29 DIAGNOSIS — E119 Type 2 diabetes mellitus without complications: Secondary | ICD-10-CM

## 2012-09-29 DIAGNOSIS — J309 Allergic rhinitis, unspecified: Secondary | ICD-10-CM

## 2012-09-29 DIAGNOSIS — I10 Essential (primary) hypertension: Secondary | ICD-10-CM

## 2012-09-29 LAB — HEMOGLOBIN A1C: Hgb A1c MFr Bld: 6.3 % (ref 4.6–6.5)

## 2012-09-29 MED ORDER — ATORVASTATIN CALCIUM 10 MG PO TABS: 10.0000 mg | ORAL_TABLET | Freq: Every day | ORAL | Status: DC

## 2012-09-29 MED ORDER — TESTOSTERONE CYPIONATE 200 MG/ML IM SOLN
200.0000 mg | Freq: Once | INTRAMUSCULAR | Status: AC
  Administered 2012-09-29: 200 mg via INTRAMUSCULAR

## 2012-09-29 MED ORDER — METHYLPREDNISOLONE ACETATE 80 MG/ML IJ SUSP
80.0000 mg | Freq: Once | INTRAMUSCULAR | Status: AC
  Administered 2012-09-29: 80 mg via INTRAMUSCULAR

## 2012-09-29 NOTE — Assessment & Plan Note (Addendum)
Stopped pravastatin fall 2012 due to diarrhea - Recheck annually, last lipids reviewed reconsider tx ongoing given comorbid DM, CAD -  pt agrees to low dose atorva now given others in FH with sudden MI age 65s

## 2012-09-29 NOTE — Progress Notes (Signed)
Subjective:    Patient ID: [name], male    DOB: 06-17-1970, 42 y.o.   MRN: [id number]  HPI  here for follow up - reviewed chronic medical issues   DM2 - much improved since off prednisone in early 2012 - januvia stopped and metformin decreased fall 2012 due to diarrhea AND good a1c control; checks cbgs at home 2 x/day - home cbgs 120s - no polyuria or polydipsia - previously seeing endo for same ([facility]), now only following with PCP   sarcoidosis - involves lungs, joints and ?brain/liver - has been maintained on MTX for same - off 03/2010 due to insurance issues, then temporary low dose pred, then resumed MTX 2012, off pred since resuming MTX- followed by pulm and rheum (prev [facility], now [facility]), also neuro ([facility]) -  no recent arthritis symptoms    CAD - follows with [facility] for same - hosp and cath 06/2009: nonobst dx, med mgmt - no chest pain since that time - resolved ankle edema, s/p stress test 04/24/10 due to continued dyspnea on exertion> no ischemia   hypertension - reports compliance with ongoing medical treatment. changes in medication reviewed (prev on atenolol, changed to metoprolol by pulm 12/03/09 for pulm issues). denies adverse side effects related to current therapy.     dyslipidemia - on previously statin, stopped fall 2012 due to diarrhea.    ED with hypogonadism - tried viagra, levitra and cialis in past - prefers cialis at this time - reports no interest as primary cause of problems but denies depression -  also low testosterone level dx 10/2009 - now on IM replacement q 2 weeks  Past Medical History  Diagnosis Date  . PULMONARY SARCOIDOSIS     Mediastinal lymphadenopathy with biospy proven sarcodosis  . DIABETES MELLITUS, TYPE II   . TESTICULAR HYPOFUNCTION   . HYPERLIPIDEMIA   . Gout, unspecified   . POLYNEUROPATHY   . HYPERTENSION   . CORONARY ARTERY DISEASE   . GERD   . ERECTILE DYSFUNCTION, ORGANIC   . Edema   . TIA (transient ischemic attack)    . Arthritis     ? 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 Followed by Dr. Nickola Major  . Narcolepsy without cataplexy     MSLT 01/09/09 & MRI brain 01/09/09  . Pericarditis     recurrent  . Hypokalemia     Review of Systems  Constitutional: Negative for fever, fatigue and unexpected weight change.  HENT: Positive for congestion, rhinorrhea, sneezing, postnasal drip and sinus pressure. Negative for ear pain, facial swelling and tinnitus.   Respiratory: Negative for cough, shortness of breath and wheezing.   Cardiovascular: Negative for chest pain and palpitations.  Gastrointestinal: Negative for nausea and rectal pain.       Objective:   Physical Exam  BP 112/78  Pulse 74  Temp(Src) 97.8 F (36.6 C) (Oral)  Wt 260 lb 1.9 oz (117.99 kg)  BMI 32.51 kg/m2  SpO2 97% Wt Readings from Last 3 Encounters:  09/29/12 260 lb 1.9 oz (117.99 kg)  07/08/12 266 lb (120.657 kg)  04/04/12 263 lb 3.2 oz (119.387 kg)   Constitutional:  He is overweight, but well-developed and well-nourished. No distress.  HENT: minimal sinus tenderness frontal to palpation, nares with swollen pale mucosa but no exudate or discolored discharge. Oropharynx minimally red without PND. Ears with clear tympanic membranes bilaterally, no effusion or erythema Neck: Normal range of motion. Neck supple. No JVD present. No thyromegaly present.  Cardiovascular: Normal  rate, regular rhythm and normal heart sounds.  No murmur heard. no BLE edema Pulmonary/Chest: Effort normal and breath sounds normal. No respiratory distress. no wheezes. Psychiatric: he has a normal mood and affect. behavior is normal. Judgment and thought content normal.   Lab Results  Component Value Date   WBC 6.3 07/09/2011   HGB 17.7* 08/27/2011   HCT 52.0 08/27/2011   PLT 268.0 07/09/2011   GLUCOSE 146* 08/27/2011   CHOL 189 04/04/2012   TRIG 109.0 04/04/2012   HDL 34.60* 04/04/2012   LDLCALC 133* 04/04/2012   ALT 41 07/09/2011   AST 28 07/09/2011    NA 138 08/27/2011   K 4.2 08/27/2011   CL 100 08/27/2011   CREATININE 0.90 08/27/2011   BUN 11 08/27/2011   CO2 32 07/09/2011   TSH 0.61 07/09/2011   INR 0.99 06/24/2009   HGBA1C 6.4 04/04/2012   MICROALBUR 11.5* 07/09/2011      Assessment & Plan:  See problem list. Medications and labs reviewed today.  Allergic sinusitis - no evidence for infection on exam at this time, low threshold for empiric antibiotics given immunosuppression with chronic methotrexate. Treat with Medrol 80 mg IM today, patient call if symptoms worse or unimproved in next 72 hours, sooner if fever

## 2012-09-29 NOTE — Assessment & Plan Note (Signed)
Lab Results  Component Value Date   HGBA1C 6.4 04/04/2012   Follows with endo/ellison intermittently - improved glycemic control off pred since early 2012 Check a1c and annual microalb/cr - titrate meds as needed Off januvia fall 2012 and decreased dose metformin fall 2012 due to good a1c control On ASA -  declines statin or ARB The current medical regimen is effective;  continue present plan and medications.

## 2012-09-29 NOTE — Patient Instructions (Signed)
It was good to see you today. We have reviewed your prior records including labs and tests today Test(s) ordered today. Your results will be released to MyChart (or called to you) after review, usually within 72hours after test completion. If any changes need to be made, you will be notified at that same time. If you develop worsening symptoms or fever, call and we can reconsider antibiotics, but it does not appear necessary to use antibiotics at this time. Medrol steroid shot given today for allergic sinus symptoms and asthma. Call if symptoms worse or unimproved in next 72 hours, sooner if fever Start low-dose generic Lipitor to treat cholesterol and reduce risk of heart attack or stroke -Your prescription(s) have been submitted to your pharmacy. Please take as directed and contact our office if you believe you are having problem(s) with the medication(s). Other medications reviewed and updated, no additional changes recommended at this time Please schedule followup in 3-6 months to monitor your cholesterol on treatment and diabetes control, call sooner if problems.

## 2012-09-29 NOTE — Assessment & Plan Note (Signed)
BP Readings from Last 3 Encounters:  09/29/12 112/78  07/08/12 104/60  04/04/12 112/72   The current medical regimen is effective;  continue present plan and medications.

## 2012-10-04 ENCOUNTER — Ambulatory Visit: Payer: Medicare Other | Admitting: Internal Medicine

## 2012-10-21 ENCOUNTER — Other Ambulatory Visit: Payer: Self-pay | Admitting: Internal Medicine

## 2012-10-24 ENCOUNTER — Other Ambulatory Visit: Payer: Self-pay | Admitting: Cardiovascular Disease

## 2012-10-28 ENCOUNTER — Ambulatory Visit: Payer: Medicare Other | Admitting: Cardiovascular Disease

## 2012-11-10 ENCOUNTER — Other Ambulatory Visit: Payer: Self-pay | Admitting: Internal Medicine

## 2012-11-17 ENCOUNTER — Ambulatory Visit: Payer: Medicare Other | Admitting: Cardiovascular Disease

## 2012-11-24 ENCOUNTER — Ambulatory Visit (INDEPENDENT_AMBULATORY_CARE_PROVIDER_SITE_OTHER): Payer: Medicare Other | Admitting: Cardiovascular Disease

## 2012-11-24 ENCOUNTER — Encounter: Payer: Self-pay | Admitting: Cardiovascular Disease

## 2012-11-24 VITALS — BP 122/80 | HR 64 | Resp 12 | Ht 75.0 in | Wt 263.0 lb

## 2012-11-24 DIAGNOSIS — I2510 Atherosclerotic heart disease of native coronary artery without angina pectoris: Secondary | ICD-10-CM

## 2012-11-24 DIAGNOSIS — I10 Essential (primary) hypertension: Secondary | ICD-10-CM

## 2012-11-24 DIAGNOSIS — D869 Sarcoidosis, unspecified: Secondary | ICD-10-CM

## 2012-11-24 DIAGNOSIS — E785 Hyperlipidemia, unspecified: Secondary | ICD-10-CM

## 2012-11-24 NOTE — Patient Instructions (Addendum)
Your physician recommends that you schedule a follow-up appointment in: 1 year  

## 2012-11-27 ENCOUNTER — Other Ambulatory Visit: Payer: Self-pay | Admitting: Internal Medicine

## 2012-11-27 NOTE — Assessment & Plan Note (Signed)
He is currently reported to be in remission while treatment with methotrexate. He has not recently required steroid therapy. It is possible that his aches and pains are related to pericarditis, but I believe they're more likely to be musculoskeletal.

## 2012-11-27 NOTE — Assessment & Plan Note (Addendum)
Good control in the current regimen. Since he has mild diabetes mellitus, consideration might be given to switching him to angiotensin receptor blocker or ACE inhibitor, but I believe this decision is best left to his primary care physician.

## 2012-11-27 NOTE — Progress Notes (Signed)
Patient ID: [name], male   DOB: April 21, 1971, 42 y.o.   MRN: [id number]      Reason for office visit Followup chest pain and coronary risk factors  [name] continues to have episodic brief complaints of chest soreness. These appeared to be positional and they are never related to activity. The muscle was occur when he is lying in bed. He has had occasional mild ankle swelling and rare palpitations but otherwise has no cardiovascular complaints.  He is no longer taking steroids but is on methotrexate for suppression of what is biopsy proven sarcoidosis, although a diagnosis of rheumatoid arthritis is also present in some of his records. He tells me that his diabetes is well-controlled. Unfortunately has not managed to lose anymore weight since his last appointment and remains in the mildly obese range    Allergies  Allergen Reactions  . Statins Diarrhea  . Sulfonamide Derivatives     REACTION: rash and fever    Current Outpatient Prescriptions  Medication Sig Dispense Refill  . allopurinol (ZYLOPRIM) 300 MG tablet TAKE 1 TABLET EVERY DAY  30 tablet  5  . aspirin 325 MG tablet Take 325 mg by mouth daily.        Marland Kitchen atorvastatin (LIPITOR) 10 MG tablet Take 1 tablet (10 mg total) by mouth daily.  90 tablet  3  . budesonide (PULMICORT) 0.25 MG/2ML nebulizer solution 1 vial twice a day. Dx: 135  60 mL  11  . COLCRYS 0.6 MG tablet Take 0.6 mg by mouth daily.       Marland Kitchen diltiazem (CARDIZEM CD) 120 MG 24 hr capsule TAKE 1 CAPSULE TWICE DAILY  60 capsule  0  . famotidine (PEPCID) 40 MG tablet Take 40 mg by mouth daily.      . folic acid (FOLVITE) 1 MG tablet Take 1 mg by mouth daily.        . furosemide (LASIX) 40 MG tablet Take 40 mg by mouth daily.      Marland Kitchen glucose blood (ONE TOUCH ULTRA TEST) test strip 1 each by Other route daily. Use as instructed  100 each  1  . hydrochlorothiazide (HYDRODIURIL) 25 MG tablet TAKE 1 TABLET EVERY DAY  30 tablet  5  . KLOR-CON M20 20 MEQ tablet TAKE 1  TABLET TWICE DAILY  60 tablet  5  . levalbuterol (XOPENEX) 0.63 MG/3ML nebulizer solution Take 3 mLs (0.63 mg total) by nebulization every 6 (six) hours as needed. Dx. 135  120 mL  5  . levETIRAcetam (KEPPRA) 500 MG tablet Take 2 tablets (1,000 mg total) by mouth at bedtime.      . metFORMIN (GLUCOPHAGE-XR) 500 MG 24 hr tablet TAKE 1 TABLET TWICE DAILY  60 tablet  5  . methotrexate (RHEUMATREX) 2.5 MG tablet Take 12.5 mg by mouth once a week. Caution:Chemotherapy. Protect from light.       . metoprolol (LOPRESSOR) 50 MG tablet TAKE 1 TABLET TWICE DAILY  60 tablet  5  . saw palmetto 160 MG capsule Take 160 mg by mouth daily.      . tadalafil (CIALIS) 5 MG tablet Take 5 mg by mouth daily as needed.        . zolpidem (AMBIEN) 5 MG tablet Take 5 mg by mouth at bedtime as needed for sleep.      Marland Kitchen gabapentin (NEURONTIN) 300 MG capsule Take 300 mg by mouth 3 (three) times daily.      Marland Kitchen levalbuterol (XOPENEX HFA) 45 MCG/ACT inhaler  Inhale 2 puffs into the lungs every 6 (six) hours as needed for wheezing or shortness of breath (Use prior to exercise as needed).  1 Inhaler  11  . RABEprazole (ACIPHEX) 20 MG tablet Take 20 mg by mouth daily.      Marland Kitchen testosterone cypionate (DEPO-TESTOSTERONE) 200 MG/ML injection Inject 1 mL (200 mg total) into the muscle every 14 (fourteen) days.  10 mL  1   No current facility-administered medications for this visit.    Past Medical History  Diagnosis Date  . PULMONARY SARCOIDOSIS     Mediastinal lymphadenopathy with biospy proven sarcodosis  . DIABETES MELLITUS, TYPE II   . TESTICULAR HYPOFUNCTION   . HYPERLIPIDEMIA   . Gout, unspecified   . POLYNEUROPATHY   . HYPERTENSION   . CORONARY ARTERY DISEASE   . GERD   . ERECTILE DYSFUNCTION, ORGANIC   . Edema   . TIA (transient ischemic attack)   . Arthritis     ? 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 Followed by Dr. Nickola [name]  . 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  Pericarditis     recurrent  . Hypokalemia     Past Surgical History  Procedure Laterality Date  . Bronchoscopy  08/21/08  . Mediastinoscopy  11/30/08  . Cardiac catheterization  06/25/2009    minimal disease    Family History  Problem Relation Age of Onset  . Diabetes Mother   . Diabetes Father   . Heart disease Father   . Hypertension Mother   . Heart failure Father     History   Social History  . Marital Status: Married    Spouse Name: N/A    Number of Children: N/A  . Years of Education: N/A   Occupational History  . Not on file.   Social History Main Topics  . Smoking status: Former Smoker -- 15 years    Types: Cigarettes    Quit date: 07/09/2008  . Smokeless tobacco: Not on file     Comment: Married, lives with wife and 3 kids. Currently student. Prev worked as a Research officer, trade union, Engineer, materials at [employer]  . Alcohol Use: Yes  . Drug Use: No  . Sexually Active: Not on file   Other Topics Concern  . Not on file   Social History Narrative  . No narrative on file    Review of systems: The patient specifically denies any chest pain at rest or with exertion, dyspnea at rest or with exertion, orthopnea, paroxysmal nocturnal dyspnea, syncope, palpitations, focal neurological deficits, intermittent claudication, lower extremity edema, unexplained weight gain, cough, hemoptysis or wheezing.  The patient also denies abdominal pain, nausea, vomiting, dysphagia, diarrhea, constipation, polyuria, polydipsia, dysuria, hematuria, frequency, urgency, abnormal bleeding or bruising, fever, chills, unexpected weight changes, mood swings, change in skin or hair texture, change in voice quality, auditory or visual problems, allergic reactions or rashes, new musculoskeletal complaints other than usual "aches and pains".   PHYSICAL EXAM BP 122/80  Pulse 64  Resp 12  Ht 6\' 3"  (1.905 m)  Wt 263 lb (119.296 kg)  BMI 32.87 kg/m2  General: Alert, oriented x3, no distress Head: no evidence  of trauma, PERRL, EOMI, no exophtalmos or lid lag, no myxedema, no xanthelasma; normal ears, nose and oropharynx Neck: normal jugular venous pulsations and no hepatojugular reflux; brisk carotid pulses without delay and no carotid bruits Chest: clear to auscultation, no signs of consolidation by percussion or palpation, normal fremitus, symmetrical  and full respiratory excursions Cardiovascular: normal position and quality of the apical impulse, regular rhythm, normal first and second heart sounds, no murmurs, rubs or gallops Abdomen: no tenderness or distention, no masses by palpation, no abnormal pulsatility or arterial bruits, normal bowel sounds, no hepatosplenomegaly Extremities: no clubbing, cyanosis or edema; 2+ radial, ulnar and brachial pulses bilaterally; 2+ right femoral, posterior tibial and dorsalis pedis pulses; 2+ left femoral, posterior tibial and dorsalis pedis pulses; no subclavian or femoral bruits Neurological: grossly nonfocal   EKG: Sinus rhythm with prominent changes of early repolarization unchanged from previous tracings  Lipid Panel     Component Value Date/Time   CHOL 189 04/04/2012 0955   TRIG 109.0 04/04/2012 0955   HDL 34.60* 04/04/2012 0955   CHOLHDL 5 04/04/2012 0955   VLDL 21.8 04/04/2012 0955   LDLCALC 133* 04/04/2012 0955    BMET    Component Value Date/Time   NA 138 08/27/2011 1052   K 4.2 08/27/2011 1052   CL 100 08/27/2011 1052   CO2 32 07/09/2011 0946   GLUCOSE 146* 08/27/2011 1052   BUN 11 08/27/2011 1052   CREATININE 0.90 08/27/2011 1052   CALCIUM 9.8 07/09/2011 0946   GFRNONAA >60 01/26/2011 0238   GFRAA >60 01/26/2011 0238     ASSESSMENT AND PLAN CORONARY ARTERY DISEASE Coronary angiography in 2011 showed "near normal coronary arteries". His chest pain syndrome is highly atypical for coronary disease. The symptoms are never exertional and appear to be mostly positional. His electrocardiogram does not show any evidence of coronary insufficiency.  A nuclear stress test in 2009 did not show evidence of ischemia or scar.  HYPERTENSION Good control in the current regimen. Since he has mild diabetes mellitus, consideration might be given to switching him to angiotensin receptor blocker or ACE inhibitor, but I believe this decision is best left to his primary care physician.  HYPERLIPIDEMIA Target LDL less than 100. Has not had reassessment since starting treatment with atorvastatin. He is due to have laboratory testing with his primary care physician.  PULMONARY SARCOIDOSIS He is currently reported to be in remission while treatment with methotrexate. He has not recently required steroid therapy. It is possible that his aches and pains are related to pericarditis, but I believe they're more likely to be musculoskeletal.  Orders Placed This Encounter  Procedures  . EKG 12-Lead   No orders of the defined types were placed in this encounter.    Junious Silk, MD, [facility] [phone] office [phone] pager

## 2012-11-27 NOTE — Assessment & Plan Note (Signed)
Target LDL less than 100. Has not had reassessment since starting treatment with atorvastatin. He is due to have laboratory testing with his primary care physician.

## 2012-11-27 NOTE — Assessment & Plan Note (Signed)
Coronary angiography in 2011 showed "near normal coronary arteries". His chest pain syndrome is highly atypical for coronary disease. The symptoms are never exertional and appear to be mostly positional. His electrocardiogram does not show any evidence of coronary insufficiency. A nuclear stress test in 2009 did not show evidence of ischemia or scar.

## 2012-11-29 ENCOUNTER — Other Ambulatory Visit: Payer: Self-pay | Admitting: Internal Medicine

## 2012-11-29 NOTE — Telephone Encounter (Signed)
Pt called req refill for Ambien. Please advise.

## 2012-11-29 NOTE — Telephone Encounter (Signed)
ok 

## 2012-11-30 MED ORDER — ZOLPIDEM TARTRATE 5 MG PO TABS: 5.0000 mg | ORAL_TABLET | Freq: Every evening | ORAL | Status: DC | PRN

## 2012-11-30 NOTE — Telephone Encounter (Signed)
[facility] called pt no answer [facility].../lmb

## 2012-12-29 ENCOUNTER — Ambulatory Visit: Payer: Medicare Other | Admitting: Internal Medicine

## 2012-12-29 DIAGNOSIS — Z0289 Encounter for other administrative examinations: Secondary | ICD-10-CM

## 2013-01-05 ENCOUNTER — Encounter: Payer: Self-pay | Admitting: Pulmonary Disease

## 2013-01-05 ENCOUNTER — Ambulatory Visit (INDEPENDENT_AMBULATORY_CARE_PROVIDER_SITE_OTHER): Payer: Medicare Other | Admitting: Pulmonary Disease

## 2013-01-05 ENCOUNTER — Ambulatory Visit (INDEPENDENT_AMBULATORY_CARE_PROVIDER_SITE_OTHER)
Admission: RE | Admit: 2013-01-05 | Discharge: 2013-01-05 | Disposition: A | Discharge status: Home / Self Care | Payer: Medicare Other | Source: Ambulatory Visit | Attending: Pulmonary Disease | Admitting: Pulmonary Disease

## 2013-01-05 VITALS — BP 118/84 | HR 54 | Temp 97.8°F | Ht 75.0 in | Wt 265.0 lb

## 2013-01-05 DIAGNOSIS — J209 Acute bronchitis, unspecified: Secondary | ICD-10-CM

## 2013-01-05 DIAGNOSIS — J99 Respiratory disorders in diseases classified elsewhere: Secondary | ICD-10-CM

## 2013-01-05 DIAGNOSIS — D869 Sarcoidosis, unspecified: Secondary | ICD-10-CM

## 2013-01-05 DIAGNOSIS — D860 Sarcoidosis of lung: Secondary | ICD-10-CM

## 2013-01-05 DIAGNOSIS — J4521 Mild intermittent asthma with (acute) exacerbation: Secondary | ICD-10-CM

## 2013-01-05 DIAGNOSIS — J45909 Unspecified asthma, uncomplicated: Secondary | ICD-10-CM | POA: Insufficient documentation

## 2013-01-05 DIAGNOSIS — J45901 Unspecified asthma with (acute) exacerbation: Secondary | ICD-10-CM

## 2013-01-05 MED ORDER — CEFUROXIME AXETIL 500 MG PO TABS: 500.0000 mg | ORAL_TABLET | Freq: Two times a day (BID) | ORAL | Status: DC

## 2013-01-05 MED ORDER — PREDNISONE 10 MG PO TABS: ORAL_TABLET | ORAL | Status: DC

## 2013-01-05 NOTE — Patient Instructions (Signed)
Prednisone 10 mg pill >> 3 pills for 2 days, 2 pills for 2 days, 1 pill for 2 days Ceftin 500 mg >> 1 pill twice per day for 7 days Chest xray today >> will call with results Follow up in 6 months

## 2013-01-05 NOTE — Assessment & Plan Note (Signed)
He is followed at [facility] with rheumatology while on MTX.  If CXR shows significant findings, then may need CT chest and repeat PFTs.

## 2013-01-05 NOTE — Progress Notes (Signed)
Chief Complaint  Patient presents with  . Acute Visit    Increased SOB. Worse at night. Reports congestion, chest tightness and coughing. Onset was 2 weeks ago, but worse in the past couple of days.    CC: Dr. Lanell Matar, Dr. Craige Cotta  History of Present Illness: [name] is a 42 y.o. male with pulmonary/neuro/joint sarcoidosis, and mild/persistent asthma.  He has noticed more trouble with his breathing for the past 2 weeks.  This has gotten especially bad over the past 3 days.  He is having cough and wheeze which is worse when he lays down.  He feels like he has chest congestion, but can't cough anything up.  He denies fever or chills, but has been getting sweats.  He denies sinus congestion or sore throat.  He denies headache, abdominal symptoms, gland swelling, joint swelling, or skin rash.  He has not had any sick contacts.  He has not used his inhalers/nebulizers for several months >> couldn't afford these.  He remains on MTX by rheumatology for his sarcoid.  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  He  has past surgical history that includes Bronchoscopy (08/21/08); Mediastinoscopy (11/30/08); and Cardiac catheterization (06/25/2009).   Outpatient Encounter Prescriptions as of 01/05/2013  Medication Sig Dispense Refill  . allopurinol (ZYLOPRIM) 300 MG tablet TAKE 1 TABLET EVERY DAY  30 tablet  5  . aspirin 325 MG tablet Take 325 mg by mouth daily.        Marland Kitchen atorvastatin (LIPITOR) 10 MG tablet Take 1 tablet (10 mg total) by mouth daily.   90 tablet  3  . budesonide (PULMICORT) 0.25 MG/2ML nebulizer solution 1 vial twice a day. Dx: 135  60 mL  11  . COLCRYS 0.6 MG tablet Take 0.6 mg by mouth daily.       Marland Kitchen diltiazem (CARDIZEM CD) 120 MG 24 hr capsule TAKE 1 CAPSULE TWICE DAILY  60 capsule  5  . famotidine (PEPCID) 40 MG tablet Take 40 mg by mouth daily.      . folic acid (FOLVITE) 1 MG tablet Take 1 mg by mouth daily.        . furosemide (LASIX) 40 MG tablet TAKE 1 TABLET EVERY DAY AS DIRECTED  30 tablet  5  . gabapentin (NEURONTIN) 300 MG capsule Take 300 mg by mouth 3 (three) times daily.      Marland Kitchen glucose blood (ONE TOUCH ULTRA TEST) test strip 1 each by Other route daily. Use as instructed  100 each  1  . hydrochlorothiazide (HYDRODIURIL) 25 MG tablet TAKE 1 TABLET EVERY DAY  30 tablet  5  . KLOR-CON M20 20 MEQ tablet TAKE 1 TABLET TWICE DAILY  60 tablet  5  . levalbuterol (XOPENEX HFA) 45 MCG/ACT inhaler Inhale 2 puffs into the lungs every 6 (six) hours as needed for wheezing or shortness of breath (Use prior to exercise as needed).  1 Inhaler  11  . levETIRAcetam (KEPPRA) 500 MG tablet Take 2 tablets (1,000 mg total) by mouth at bedtime.      . metFORMIN (GLUCOPHAGE-XR) 500 MG 24 hr tablet TAKE 1 TABLET TWICE DAILY  60 tablet  5  . methotrexate (RHEUMATREX) 2.5 MG tablet Take 12.5 mg by mouth once a week. Caution:Chemotherapy. Protect from light.       . metoprolol (LOPRESSOR) 50 MG tablet TAKE 1 TABLET TWICE DAILY  60 tablet  5  . RABEprazole (ACIPHEX) 20 MG tablet Take 20 mg by mouth daily.      . saw palmetto 160 MG capsule Take 160 mg by mouth daily.      . tadalafil (CIALIS) 5 MG tablet Take 5 mg by mouth daily as needed.        . testosterone cypionate (DEPO-TESTOSTERONE) 200 MG/ML injection Inject 1 mL (200 mg total) into the muscle every 14 (fourteen) days.  10 mL  1  . zolpidem (AMBIEN) 5 MG tablet Take 1 tablet (5 mg total) by mouth at bedtime as needed for sleep.  30 tablet  3  . levalbuterol (XOPENEX) 0.63 MG/3ML  nebulizer solution Take 3 mLs (0.63 mg total) by nebulization every 6 (six) hours as needed. Dx. 135  120 mL  5   No facility-administered encounter medications on file as of 01/05/2013.    Allergies  Allergen Reactions  . Statins Diarrhea  . Sulfonamide Derivatives     REACTION: rash and fever    Physical Exam:  General - No distress ENT - No sinus tenderness, no oral exudate, no LAN Cardiac - s1s2 regular, no murmur Chest - No wheeze/rales/dullness Back - No focal tenderness Abd - Soft, non-tender Ext - No edema Neuro - Normal strength Skin - No rashes Psych - normal mood, and behavior   Assessment/Plan:  [name], MD [facility] Pager:  [phone] 01/05/2013, 4:34 PM

## 2013-01-05 NOTE — Assessment & Plan Note (Signed)
Will give him course of ceftin and prednisone.  Will repeat his chest xray.  He can continue prn xopenex HFA.

## 2013-01-06 ENCOUNTER — Telehealth: Payer: Self-pay | Admitting: Pulmonary Disease

## 2013-01-06 NOTE — Telephone Encounter (Signed)
Pt is aware of results. 

## 2013-01-06 NOTE — Telephone Encounter (Signed)
Dg Chest 2 View  01/05/2013   *RADIOLOGY REPORT*  Clinical Data: Increased shortness of breath, history of sarcoidosis  CHEST - 2 VIEW  Comparison: Chest x-ray of 01/26/2011  Findings: No active infiltrate or effusion is seen.  Mediastinal contours are stable.  No definite adenopathy is seen.  The heart is within upper limits of normal and stable.  No bony abnormality is seen.  IMPRESSION: Stable chest x-ray.  No definite adenopathy.   Original Report Authenticated By: Dwyane Dee, M.D.    Will have my nurse inform pt that CXR is normal.  No change to treatment plan from 01/05/13.

## 2013-01-13 ENCOUNTER — Other Ambulatory Visit: Payer: Self-pay | Admitting: Internal Medicine

## 2013-03-14 ENCOUNTER — Other Ambulatory Visit: Payer: Self-pay | Admitting: Internal Medicine

## 2013-03-14 NOTE — Telephone Encounter (Signed)
F[facility]ed [facility] to [facility].../lmb 

## 2013-03-16 ENCOUNTER — Other Ambulatory Visit: Payer: Self-pay | Admitting: *Deleted

## 2013-03-16 NOTE — Telephone Encounter (Signed)
Received fax pt is needing PA on his testosterone. Notified insurance they have fax over PA form has been completed and fax back. Waiting on approval status...lmb

## 2013-03-17 ENCOUNTER — Other Ambulatory Visit: Payer: Self-pay | Admitting: *Deleted

## 2013-03-17 NOTE — Telephone Encounter (Signed)
Received fax pt needing PA on his testosterone. Notified insurance fax over PA form has been completed and fax back. Waiting on approval status...Raechel Chute

## 2013-03-17 NOTE — Telephone Encounter (Signed)
See previous msg closed my mistake. Pt had needed PA on his testosterone. Received PA back med was approve. Notified pharmacy spoke with Drenda Freeze gave approval statis...lmb

## 2013-03-17 NOTE — Telephone Encounter (Signed)
Gavin Pound from [facility] @ [phone] called states pts Testosterone is approved from [id number].

## 2013-03-21 ENCOUNTER — Encounter: Payer: Self-pay | Admitting: Internal Medicine

## 2013-03-21 ENCOUNTER — Ambulatory Visit (INDEPENDENT_AMBULATORY_CARE_PROVIDER_SITE_OTHER): Payer: Medicare Other | Admitting: Internal Medicine

## 2013-03-21 VITALS — BP 112/70 | HR 78 | Temp 98.5°F | Wt 262.1 lb

## 2013-03-21 DIAGNOSIS — S61452A Open bite of left hand, initial encounter: Secondary | ICD-10-CM

## 2013-03-21 DIAGNOSIS — F411 Generalized anxiety disorder: Secondary | ICD-10-CM

## 2013-03-21 DIAGNOSIS — W540XXA Bitten by dog, initial encounter: Secondary | ICD-10-CM

## 2013-03-21 DIAGNOSIS — Z23 Encounter for immunization: Secondary | ICD-10-CM

## 2013-03-21 DIAGNOSIS — S61409A Unspecified open wound of unspecified hand, initial encounter: Secondary | ICD-10-CM

## 2013-03-21 MED ORDER — AMOXICILLIN-POT CLAVULANATE 875-125 MG PO TABS: 1.0000 | ORAL_TABLET | Freq: Two times a day (BID) | ORAL | Status: DC

## 2013-03-21 MED ORDER — AMITRIPTYLINE HCL 10 MG PO TABS: 10.0000 mg | ORAL_TABLET | Freq: Every day | ORAL | Status: DC

## 2013-03-21 NOTE — Progress Notes (Signed)
Subjective:    Patient ID: [name], male    DOB: June 08, 1971, 42 y.o.   MRN: [id number]  Animal Bite  The incident occurred yesterday. The incident occurred at another residence. There is an injury to the left hand (palm side 5th MC ray). The pain is mild. It is unlikely that a foreign body is present. Pertinent negatives include no chest pain, no inability to bear weight, no focal weakness, no decreased responsiveness, no loss of consciousness, no seizures, no tingling, no weakness and no cough. There have been no prior injuries to these areas. He is left-handed. His tetanus status is UTD. He has been behaving normally. There were no sick contacts.   Past Medical History  Diagnosis Date  . PULMONARY SARCOIDOSIS     Mediastinal lymphadenopathy with biospy proven sarcodosis  . DIABETES MELLITUS, TYPE II   . TESTICULAR HYPOFUNCTION   . HYPERLIPIDEMIA   . Gout, unspecified   . POLYNEUROPATHY   . HYPERTENSION   . CORONARY ARTERY DISEASE   . GERD   . ERECTILE DYSFUNCTION, ORGANIC   . Edema   . TIA (transient ischemic attack)   . Arthritis     ? 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 Followed by Dr. Nickola Major  . 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 Pericarditis     recurrent  . Hypokalemia     Review of Systems  Constitutional: Negative for fever, decreased responsiveness and fatigue.  Respiratory: Negative for cough.   Cardiovascular: Negative for chest pain.  Skin: Positive for wound (L hand).  Neurological: Negative for tingling, focal weakness, seizures, loss of consciousness and weakness.  Psychiatric/Behavioral: Positive for sleep disturbance. The patient is nervous/anxious (situational with job uncertainty).        Objective:   Physical Exam BP 112/70  Pulse 78  Temp(Src) 98.5 F (36.9 C) (Oral)  Wt 262 lb 1.9 oz (118.897 kg)  BMI 32.76 kg/m2  SpO2 98% Wt Readings from Last 3 Encounters:  03/21/13 262 lb 1.9 oz  (118.897 kg)  01/05/13 265 lb (120.203 kg)  11/24/12 263 lb (119.296 kg)   Constitutional: he appears well-developed and well-nourished. No distress. nontoxic Neck: Normal range of motion. Neck supple. No JVD present. No thyromegaly present.  Cardiovascular: Normal rate, regular rhythm and normal heart sounds.  No murmur heard. No BLE edema. Pulmonary/Chest: Effort normal and breath sounds normal. No respiratory distress. he has no wheezes.  Musculoskeletal: Normal range of motion, no joint effusions. No gross deformities. Left hand neurovascularly intact  Skin: small skin tear over palmar surface left hand fifth metacarpal Pad - mild surrounding erythema and local soft tissue edema, no drainage purulence or exudate. Flexor and extensor tendon function intact. Remaining skin is warm and dry. No rash noted. Marland Kitchen  Psychiatric: he has a mildly dysphoric mood and affect. behavior is normal. Judgment and thought content normal.  Lab Results  Component Value Date   WBC 6.3 07/09/2011   HGB 17.7* 08/27/2011   HCT 52.0 08/27/2011   PLT 268.0 07/09/2011   GLUCOSE 146* 08/27/2011   CHOL 189 04/04/2012   TRIG 109.0 04/04/2012   HDL 34.60* 04/04/2012   LDLCALC 133* 04/04/2012   ALT 41 07/09/2011   AST 28 07/09/2011   NA 138 08/27/2011   K 4.2 08/27/2011   CL 100 08/27/2011   CREATININE 0.90 08/27/2011   BUN 11 08/27/2011   CO2 32 07/09/2011   TSH 0.61 07/09/2011   INR 0.99  06/24/2009   HGBA1C 6.3 09/29/2012   MICROALBUR 11.5* 07/09/2011       Assessment & Plan:   Dog bite to left hand, initial encounter. Incident occurred under 24-hour ago. The pet belongs to family friend, known rabies vaccination up-to-date.   Tetanus booster updated today Wound care instructions provided Empiric antibiotics Augmentin twice a day Continue ibuprofen as needed for pain Patient education provided on same   Situational anxiety with depression. Patient previously using low-dose Ambien, but requests alternate medication  to help with sleep and mood swings. Discussed potential options and we selected low-dose amitriptyline for this purpose. we reviewed potential risk/benefit and possible side effects - pt understands and agrees to same - Erx done

## 2013-03-21 NOTE — Progress Notes (Signed)
Pre-visit discussion using our clinic review tool. No additional management support is needed unless otherwise documented below in the visit note.  

## 2013-03-21 NOTE — Patient Instructions (Addendum)
It was good to see you today.  Tetanus immunization updated today  Keep left hand clean and wash in warm soapy water twice daily as discussed for the next week  Augmentin antibiotics twice a day for one week to treat potential infection -  Use amitriptyline in the evening to help with mood and sleep as discussed  Your prescription(s) have been submitted to your pharmacy. Please take as directed and contact our office if you believe you are having problem(s) with the medication(s).  Animal Bite An animal bite can result in a scratch on the skin, deep open cut, puncture of the skin, crush injury, or tearing away of the skin or a body part. Dogs are responsible for most animal bites. Children are bitten more often than adults. An animal bite can range from very mild to more serious. A small bite from your house pet is no cause for alarm. However, some animal bites can become infected or injure a bone or other tissue. You must seek medical care if:  The skin is broken and bleeding does not slow down or stop after 15 minutes.  The puncture is deep and difficult to clean (such as a cat bite).  Pain, warmth, redness, or pus develops around the wound.  The bite is from a stray animal or rodent. There may be a risk of rabies infection.  The bite is from a snake, raccoon, skunk, fox, coyote, or bat. There may be a risk of rabies infection.  The person bitten has a chronic illness such as diabetes, liver disease, or cancer, or the person takes medicine that lowers the immune system.  There is concern about the location and severity of the bite. It is important to clean and protect an animal bite wound right away to prevent infection. Follow these steps:  Clean the wound with plenty of water and soap.  Apply an antibiotic cream.  Apply gentle pressure over the wound with a clean towel or gauze to slow or stop bleeding.  Elevate the affected area above the heart to help stop any  bleeding.  Seek medical care. Getting medical care within 8 hours of the animal bite leads to the best possible outcome. DIAGNOSIS  Your caregiver will most likely:  Take a detailed history of the animal and the bite injury.  Perform a wound exam.  Take your medical history. Blood tests or X-rays may be performed. Sometimes, infected bite wounds are cultured and sent to a lab to identify the infectious bacteria.  TREATMENT  Medical treatment will depend on the location and type of animal bite as well as the patient's medical history. Treatment may include:  Wound care, such as cleaning and flushing the wound with saline solution, bandaging, and elevating the affected area.  Antibiotics.  Tetanus immunization.  Rabies immunization.  Leaving the wound open to heal. This is often done with animal bites, due to the high risk of infection. However, in certain cases, wound closure with stitches, wound adhesive, skin adhesive strips, or staples may be used. Infected bites that are left untreated may require intravenous (IV) antibiotics and surgical treatment in the hospital. HOME CARE INSTRUCTIONS  Follow your caregiver's instructions for wound care.  Take all medicines as directed.  If your caregiver prescribes antibiotics, take them as directed. Finish them even if you start to feel better.  Follow up with your caregiver for further exams or immunizations as directed. You may need a tetanus shot if:  You cannot remember when you  had your last tetanus shot.  You have never had a tetanus shot.  The injury broke your skin. If you get a tetanus shot, your arm may swell, get red, and feel warm to the touch. This is common and not a problem. If you need a tetanus shot and you choose not to have one, there is a rare chance of getting tetanus. Sickness from tetanus can be serious. SEEK MEDICAL CARE IF:  You notice warmth, redness, soreness, swelling, pus discharge, or a bad smell  coming from the wound.  You have a red line on the skin coming from the wound.  You have a fever, chills, or a general ill feeling.  You have nausea or vomiting.  You have continued or worsening pain.  You have trouble moving the injured part.  You have other questions or concerns. MAKE SURE YOU:  Understand these instructions.  Will watch your condition.  Will get help right away if you are not doing well or get worse. Document Released: 02/10/2011 Document Revised: 08/17/2011 Document Reviewed: 02/10/2011 [facility] Patient Information 2014 [place], [place].

## 2013-03-24 ENCOUNTER — Encounter (HOSPITAL_COMMUNITY): Payer: Self-pay | Admitting: Emergency Medicine

## 2013-03-24 ENCOUNTER — Telehealth: Payer: Self-pay | Admitting: Internal Medicine

## 2013-03-24 ENCOUNTER — Emergency Department (INDEPENDENT_AMBULATORY_CARE_PROVIDER_SITE_OTHER)
Admission: EM | Admit: 2013-03-24 | Discharge: 2013-03-24 | Disposition: A | Discharge status: Home / Self Care | Payer: Medicare Other | Source: Home / Self Care | Attending: Family Medicine | Admitting: Family Medicine

## 2013-03-24 DIAGNOSIS — L03119 Cellulitis of unspecified part of limb: Secondary | ICD-10-CM

## 2013-03-24 DIAGNOSIS — F411 Generalized anxiety disorder: Secondary | ICD-10-CM

## 2013-03-24 DIAGNOSIS — L02519 Cutaneous abscess of unspecified hand: Secondary | ICD-10-CM

## 2013-03-24 DIAGNOSIS — S61409A Unspecified open wound of unspecified hand, initial encounter: Secondary | ICD-10-CM

## 2013-03-24 DIAGNOSIS — S61451A Open bite of right hand, initial encounter: Secondary | ICD-10-CM

## 2013-03-24 DIAGNOSIS — Z23 Encounter for immunization: Secondary | ICD-10-CM

## 2013-03-24 DIAGNOSIS — W540XXA Bitten by dog, initial encounter: Secondary | ICD-10-CM

## 2013-03-24 LAB — CBC
HCT: 43.3 % (ref 39.0–52.0)
Hemoglobin: 15.1 g/dL (ref 13.0–17.0)
MCH: 27.8 pg (ref 26.0–34.0)
MCHC: 34.9 g/dL (ref 30.0–36.0)
MCV: 79.6 fL (ref 78.0–100.0)
Platelets: 286 10*3/uL (ref 150–400)
RBC: 5.44 MIL/uL (ref 4.22–5.81)
RDW: 13.8 % (ref 11.5–15.5)
WBC: 5.7 10*3/uL (ref 4.0–10.5)

## 2013-03-24 MED ORDER — SODIUM CHLORIDE 0.9 % IV SOLN
3.0000 g | Freq: Once | INTRAVENOUS | Status: AC
  Administered 2013-03-24: 3 g via INTRAVENOUS
  Filled 2013-03-24: qty 3

## 2013-03-24 MED ORDER — SODIUM CHLORIDE 0.9 % IV SOLN
Freq: Once | INTRAVENOUS | Status: AC
  Administered 2013-03-24: 20:00:00 via INTRAVENOUS

## 2013-03-24 MED ORDER — DOXYCYCLINE HYCLATE 100 MG PO CAPS: 100.0000 mg | ORAL_CAPSULE | Freq: Two times a day (BID) | ORAL | Status: DC

## 2013-03-24 MED ORDER — SODIUM CHLORIDE 0.9 % IV SOLN: 3.0000 g | Freq: Once | INTRAVENOUS | Status: DC

## 2013-03-24 MED ORDER — HYDROCODONE-ACETAMINOPHEN 5-325 MG PO TABS: 2.0000 | ORAL_TABLET | Freq: Four times a day (QID) | ORAL | Status: DC | PRN

## 2013-03-24 NOTE — Telephone Encounter (Signed)
Patient Information:  Caller Name: Aureliano  Phone: [phone]  Patient: [name], [name]  Gender: Male  DOB: 08-10-1970  Age: 42 Years  PCP: Rene Paci (Adults only)  Office Follow Up:  Does the office need to follow up with this patient?: No  Instructions For The Office: N/A  RN Note:  Patient states he was bitten by a dog 03/20/13. Patient states he was seen in the office 03/21/13 and was prescribed Augmentin. Patient states he also received a Tetanus Immunization and Flu shot 03/21/13. Patient states he developed a fever, onset 03/22/13. Patient states fever continued 03/23/13. Patient states he is afebrile 03/24/13. Patient states increased redness noted around site of animal bite 03/24/13. Patient states surrounding area of redness is approx. 1 inch around the wound. States pinkinsh/brown purulent drainage noted from wound. Denies odor. Care advice given per guidelines. Patient advised to be evluated in the Urgent Care now due to office closing at 1700 and no appts. available. Patient advised not to drive self. Patient verbalizes understanding and agreeable. Patient states he will go to [facility] for evluation.  Animal Bite form faxed to [facility] at [phone].  Symptoms  Reason For Call & Symptoms: Dog Bite left hand-purulent drainage  Reviewed Health History In EMR: Yes  Reviewed Medications In EMR: Yes  Reviewed Allergies In EMR: Yes  Reviewed Surgeries / Procedures: Yes  Date of Onset of Symptoms: 03/24/2013  Treatments Tried: Augmentin, Neopsorin  Treatments Tried Worked: No  Guideline(s) Used:  Puncture Wound  Animal Bite  Disposition Per Guideline:   Go to Office Now  Reason For Disposition Reached:   Bite looks infected (e.g., red area, red streak, pus, or fever)  Advice Given:  N/A  RN Overrode Recommendation:  Go To U.C.  Office is closing and no appts. available.

## 2013-03-24 NOTE — ED Notes (Signed)
Resting comfortably, friend at [facility]; NAD, denies signs or syx of allergic response to IV antibiotics

## 2013-03-24 NOTE — ED Notes (Signed)
Known dog, known shot history, treated by his PCP w antibiotic Rx, pain getting worse, swelling getting worse; history of DM< sarcoid

## 2013-03-24 NOTE — ED Provider Notes (Signed)
[name] is a 42 y.o. male who presents to Urgent Care today for left hand dog bite. Patient was bitten on the ulnar side home sided left hand on Monday. He suffered a puncture wound. He was seen by his primary care provider who gave a tetanus injection as well as Augmentin. He notes that over the past several days he's had some slight increase in redness at the site of the bite as well as mild subjective fevers. He currently does not have any fevers. He notes he is able to express a tiny amount of pus from the bite. He denies any redness extending proximally or distally. He denies significant pain with hand motion. He feels well otherwise.   Past Medical History  Diagnosis Date  . PULMONARY SARCOIDOSIS     Mediastinal lymphadenopathy with biospy proven sarcodosis  . DIABETES MELLITUS, TYPE II   . TESTICULAR HYPOFUNCTION   . HYPERLIPIDEMIA   . Gout, unspecified   . POLYNEUROPATHY   . HYPERTENSION   . CORONARY ARTERY DISEASE   . GERD   . ERECTILE DYSFUNCTION, ORGANIC   . Edema   . TIA (transient ischemic attack)   . Arthritis     ? 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 Followed by Dr. Nickola Major  . 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 Pericarditis     recurrent  . Hypokalemia    History  Substance Use Topics  . Smoking status: Former Smoker -- 15 years    Types: Cigarettes    Quit date: 07/09/2008  . Smokeless tobacco: Not on file     Comment: Married, lives with wife and 3 kids. Currently student. Prev worked as a Research officer, trade union, Engineer, materials at [employer]  . Alcohol Use: Yes   ROS as above Medications reviewed. No current facility-administered medications for this encounter.   Current Outpatient Prescriptions  Medication Sig Dispense Refill  . allopurinol (ZYLOPRIM) 300 MG tablet TAKE 1 TABLET EVERY DAY  30 tablet  5  . amitriptyline (ELAVIL) 10 MG tablet Take 1 tablet (10 mg total) by mouth at bedtime.  30 tablet  3  .  amoxicillin-clavulanate (AUGMENTIN) 875-125 MG per tablet Take 1 tablet by mouth 2 (two) times daily.  14 tablet  0  . aspirin 325 MG tablet Take 325 mg by mouth daily.        Marland Kitchen atorvastatin (LIPITOR) 10 MG tablet Take 1 tablet (10 mg total) by mouth daily.  90 tablet  3  . COLCRYS 0.6 MG tablet Take 0.6 mg by mouth daily.       Marland Kitchen diltiazem (CARDIZEM CD) 120 MG 24 hr capsule TAKE 1 CAPSULE TWICE DAILY  60 capsule  5  . doxycycline (VIBRAMYCIN) 100 MG capsule Take 1 capsule (100 mg total) by mouth 2 (two) times daily.  20 capsule  0  . famotidine (PEPCID) 40 MG tablet Take 40 mg by mouth daily.      . folic acid (FOLVITE) 1 MG tablet Take 1 mg by mouth daily.        . furosemide (LASIX) 40 MG tablet TAKE 1 TABLET EVERY DAY AS DIRECTED  30 tablet  5  . glucose blood (ONE TOUCH ULTRA TEST) test strip 1 each by Other route daily. Use as instructed  100 each  1  . hydrochlorothiazide (HYDRODIURIL) 25 MG tablet TAKE 1 TABLET EVERY DAY  30 tablet  5  . HYDROcodone-acetaminophen (NORCO/VICODIN) 5-325 MG per tablet Take 2 tablets  by mouth every 6 (six) hours as needed for pain.  10 tablet  0  . KLOR-CON M20 20 MEQ tablet TAKE 1 TABLET TWICE DAILY  60 tablet  5  . levalbuterol (XOPENEX HFA) 45 MCG/ACT inhaler Inhale 2 puffs into the lungs every 6 (six) hours as needed for wheezing or shortness of breath (Use prior to exercise as needed).  1 Inhaler  11  . levalbuterol (XOPENEX) 0.63 MG/3ML nebulizer solution Take 3 mLs (0.63 mg total) by nebulization every 6 (six) hours as needed. Dx. 135  120 mL  5  . levETIRAcetam (KEPPRA) 500 MG tablet Take 2 tablets (1,000 mg total) by mouth at bedtime.      . metFORMIN (GLUCOPHAGE-XR) 500 MG 24 hr tablet TAKE 1 TABLET TWICE DAILY  60 tablet  5  . methotrexate (RHEUMATREX) 2.5 MG tablet Take 12.5 mg by mouth once a week. Caution:Chemotherapy. Protect from light.       . metoprolol (LOPRESSOR) 50 MG tablet TAKE 1 TABLET TWICE DAILY  60 tablet  5  . RABEprazole (ACIPHEX)  20 MG tablet Take 20 mg by mouth daily.      . saw palmetto 160 MG capsule Take 160 mg by mouth daily.      . tadalafil (CIALIS) 5 MG tablet Take 5 mg by mouth daily as needed.        . testosterone cypionate (DEPOTESTOTERONE CYPIONATE) 200 MG/ML injection INJECT 1 ML INTO MUSCLE EVERY 14 DAYS  10 mL  1    Exam:  BP 153/96  Pulse 74  Temp(Src) 99.2 F (37.3 C) (Oral)  Resp 18  SpO2 99% Gen: Well NAD LEFT HAND: Puncture wound with quarter size surrounding erythema overlying the palmar fifth metacarpal.  Full and pain free and extension and flexion. No swelling or erythema extending proximally or distally.  Capillary refill and sensation is intact distally.   Results for orders placed during the hospital encounter of 03/24/13 (from the past 24 hour(s))  CBC     Status: None   Collection Time    03/24/13  6:40 PM      Result Value Range   WBC 5.7  4.0 - 10.5 K/uL   RBC 5.44  4.22 - 5.81 MIL/uL   Hemoglobin 15.1  13.0 - 17.0 g/dL   HCT 16.1  09.6 - 04.5 %   MCV 79.6  78.0 - 100.0 fL   MCH 27.8  26.0 - 34.0 pg   MCHC 34.9  30.0 - 36.0 g/dL   RDW 40.9  81.1 - 91.4 %   Platelets 286  150 - 400 K/uL   No results found.  Assessment and Plan: 42 y.o. male with left hand dog bite with cellulitis. Patient was given 3 g of IV Unasyn and also prescribe doxycycline.  Additionally he was given a splint and will followup tomorrow for wound check.   I discussed the case with Dr.Ortmann who recommended IV Unasyn hand splint and followup tomorrow for wound check.       Rodolph Bong, MD 03/24/13 [id number]

## 2013-03-25 ENCOUNTER — Encounter (HOSPITAL_COMMUNITY): Payer: Self-pay | Admitting: Emergency Medicine

## 2013-03-25 ENCOUNTER — Emergency Department (INDEPENDENT_AMBULATORY_CARE_PROVIDER_SITE_OTHER)
Admission: EM | Admit: 2013-03-25 | Discharge: 2013-03-25 | Disposition: A | Discharge status: Home / Self Care | Payer: Medicare Other | Source: Home / Self Care

## 2013-03-25 DIAGNOSIS — Z5189 Encounter for other specified aftercare: Secondary | ICD-10-CM

## 2013-03-25 DIAGNOSIS — S61409A Unspecified open wound of unspecified hand, initial encounter: Secondary | ICD-10-CM

## 2013-03-25 DIAGNOSIS — L02519 Cutaneous abscess of unspecified hand: Secondary | ICD-10-CM

## 2013-03-25 MED ORDER — LIDOCAINE HCL (PF) 1 % IJ SOLN
INTRAMUSCULAR | Status: AC
  Filled 2013-03-25: qty 5

## 2013-03-25 MED ORDER — CEFTRIAXONE SODIUM 1 G IJ SOLR
1.0000 g | Freq: Once | INTRAMUSCULAR | Status: AC
  Administered 2013-03-25: 1 g via INTRAMUSCULAR

## 2013-03-25 MED ORDER — CEFTRIAXONE SODIUM 1 G IJ SOLR
INTRAMUSCULAR | Status: AC
  Filled 2013-03-25: qty 10

## 2013-03-25 NOTE — ED Notes (Signed)
Seen yesterday and told to return today for reevaluation of wound

## 2013-03-25 NOTE — ED Notes (Signed)
Patient requested referral information, spoke to [facility] about information

## 2013-03-25 NOTE — ED Notes (Signed)
Patient not ready for discharge- orders pending

## 2013-03-25 NOTE — ED Provider Notes (Signed)
CSN: [id number]     Arrival date & time 03/25/13  1150 History   First MD Initiated Contact with Patient 03/25/13 1244     Chief Complaint  Patient presents with  . Wound Check   (Consider location/radiation/quality/duration/timing/severity/associated sxs/prior Treatment) HPI Comments: 42 year old man presents today for a wound check of the left hand status post treatment for a dog bite that occurred 6 days ago. In review, after the dog bite he was seen by his PCP he treated with Augmentin. Just a few days later he saw more swelling and pus coming from the puncture wound to the left hypo- thenar eminence. Yesterday he was evaluated by Dr. Denyse Amass and received IV Unasyn and a prescription for doxycycline twice a day by mouth.   Patient is a 42 y.o. male presenting with wound check.  Wound Check    Past Medical History  Diagnosis Date  . PULMONARY SARCOIDOSIS     Mediastinal lymphadenopathy with biospy proven sarcodosis  . DIABETES MELLITUS, TYPE II   . TESTICULAR HYPOFUNCTION   . HYPERLIPIDEMIA   . Gout, unspecified   . POLYNEUROPATHY   . HYPERTENSION   . CORONARY ARTERY DISEASE   . GERD   . ERECTILE DYSFUNCTION, ORGANIC   . Edema   . TIA (transient ischemic attack)   . Arthritis     ? 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 Followed by Dr. Nickola Major  . 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 Pericarditis     recurrent  . Hypokalemia    Past Surgical History  Procedure Laterality Date  . Bronchoscopy  08/21/08  . Mediastinoscopy  11/30/08  . Cardiac catheterization  06/25/2009    minimal disease   Family History  Problem Relation Age of Onset  . Diabetes Mother   . Diabetes Father   . Heart disease Father   . Hypertension Mother   . Heart failure Father    History  Substance Use Topics  . Smoking status: Former Smoker -- 15 years    Types: Cigarettes    Quit date: 07/09/2008  . Smokeless tobacco: Not on file     Comment:  Married, lives with wife and 3 kids. Currently student. Prev worked as a Research officer, trade union, Engineer, materials at [employer]  . Alcohol Use: Yes    Review of Systems  Constitutional: Negative for chills and fatigue.  Skin: Positive for wound. Negative for pallor.  All other systems reviewed and are negative.    Allergies  Statins and Sulfonamide derivatives  Home Medications   Current Outpatient Rx  Name  Route  Sig  Dispense  Refill  . allopurinol (ZYLOPRIM) 300 MG tablet      TAKE 1 TABLET EVERY DAY   30 tablet   5   . amitriptyline (ELAVIL) 10 MG tablet   Oral   Take 1 tablet (10 mg total) by mouth at bedtime.   30 tablet   3   . amoxicillin-clavulanate (AUGMENTIN) 875-125 MG per tablet   Oral   Take 1 tablet by mouth 2 (two) times daily.   14 tablet   0   . aspirin 325 MG tablet   Oral   Take 325 mg by mouth daily.           Marland Kitchen atorvastatin (LIPITOR) 10 MG tablet   Oral   Take 1 tablet (10 mg total) by mouth daily.   90 tablet   3   . COLCRYS 0.6 MG tablet  Oral   Take 0.6 mg by mouth daily.          Marland Kitchen diltiazem (CARDIZEM CD) 120 MG 24 hr capsule      TAKE 1 CAPSULE TWICE DAILY   60 capsule   5   . doxycycline (VIBRAMYCIN) 100 MG capsule   Oral   Take 1 capsule (100 mg total) by mouth 2 (two) times daily.   20 capsule   0   . famotidine (PEPCID) 40 MG tablet   Oral   Take 40 mg by mouth daily.         . folic acid (FOLVITE) 1 MG tablet   Oral   Take 1 mg by mouth daily.           . furosemide (LASIX) 40 MG tablet      TAKE 1 TABLET EVERY DAY AS DIRECTED   30 tablet   5   . glucose blood (ONE TOUCH ULTRA TEST) test strip   Other   1 each by Other route daily. Use as instructed   100 each   1   . hydrochlorothiazide (HYDRODIURIL) 25 MG tablet      TAKE 1 TABLET EVERY DAY   30 tablet   5   . HYDROcodone-acetaminophen (NORCO/VICODIN) 5-325 MG per tablet   Oral   Take 2 tablets by mouth every 6 (six) hours as needed for pain.   10 tablet    0   . KLOR-CON M20 20 MEQ tablet      TAKE 1 TABLET TWICE DAILY   60 tablet   5   . levalbuterol (XOPENEX HFA) 45 MCG/ACT inhaler   Inhalation   Inhale 2 puffs into the lungs every 6 (six) hours as needed for wheezing or shortness of breath (Use prior to exercise as needed).   1 Inhaler   11   . EXPIRED: levalbuterol (XOPENEX) 0.63 MG/3ML nebulizer solution   Nebulization   Take 3 mLs (0.63 mg total) by nebulization every 6 (six) hours as needed. Dx. 135   120 mL   5   . levETIRAcetam (KEPPRA) 500 MG tablet   Oral   Take 2 tablets (1,000 mg total) by mouth at bedtime.         . metFORMIN (GLUCOPHAGE-XR) 500 MG 24 hr tablet      TAKE 1 TABLET TWICE DAILY   60 tablet   5   . methotrexate (RHEUMATREX) 2.5 MG tablet   Oral   Take 12.5 mg by mouth once a week. Caution:Chemotherapy. Protect from light.          . metoprolol (LOPRESSOR) 50 MG tablet      TAKE 1 TABLET TWICE DAILY   60 tablet   5   . RABEprazole (ACIPHEX) 20 MG tablet   Oral   Take 20 mg by mouth daily.         . saw palmetto 160 MG capsule   Oral   Take 160 mg by mouth daily.         . tadalafil (CIALIS) 5 MG tablet   Oral   Take 5 mg by mouth daily as needed.           . testosterone cypionate (DEPOTESTOTERONE CYPIONATE) 200 MG/ML injection      INJECT 1 ML INTO MUSCLE EVERY 14 DAYS   10 mL   1    BP 113/59  Pulse 68  Temp(Src) 98.1 F (36.7 C) (Oral)  Resp 17  SpO2 100% Physical  Exam  Nursing note and vitals reviewed. Constitutional: He is oriented to person, place, and time. He appears well-developed and well-nourished. He appears distressed.  Neurological: He is alert and oriented to person, place, and time. He exhibits normal muscle tone.  Skin: Skin is warm and dry.  The splint was removed and the wound is observed by both myself and Dr. Denyse Amass..Cor states is peeing much better. Erythema is abating and the swelling is less. He is able to oppose the thumb and make a  partial-thickness. There is no drainage from the wound. No lymphangitis.  Psychiatric: He has a normal mood and affect.    ED Course  Procedures (including critical care time) Labs Review Labs Reviewed - No data to display Imaging Review No results found.    MDM   1. Visit for wound check      Consulted with Dr. Denyse Amass who also reassessed the wound since he saw him yesterday. We will administer Rocephin 1 g IM today and he will continue to take the doxycycline twice a day until finished. He is advised to wear the splint for another 2 days and then he may remove it. Per any new symptoms problems or worsening especially drainage, swelling, redness, red streaks, fever or other problems recheck promptly.  Hayden Rasmussen, NP 03/25/13 1259  Hayden Rasmussen, NP 03/25/13 1302  Medical screening examination/treatment/procedure(s) were performed by a resident physician or non-physician practitioner and as the supervising physician I was immediately available for consultation/collaboration.  Clementeen Graham, MD    Rodolph Bong, MD 03/27/13 [phone]

## 2013-03-25 NOTE — ED Notes (Signed)
Patient soaking, patient aware of post injection delay prior to discharge

## 2013-05-27 ENCOUNTER — Other Ambulatory Visit: Payer: Self-pay | Admitting: Internal Medicine

## 2013-06-02 ENCOUNTER — Other Ambulatory Visit: Payer: Self-pay | Admitting: Internal Medicine

## 2013-06-21 ENCOUNTER — Ambulatory Visit: Payer: Medicare Other | Admitting: Internal Medicine

## 2013-06-21 DIAGNOSIS — Z0289 Encounter for other administrative examinations: Secondary | ICD-10-CM

## 2013-07-09 ENCOUNTER — Other Ambulatory Visit: Payer: Self-pay | Admitting: Internal Medicine

## 2013-07-22 ENCOUNTER — Other Ambulatory Visit: Payer: Self-pay | Admitting: Internal Medicine

## 2013-08-01 ENCOUNTER — Other Ambulatory Visit: Payer: Self-pay | Admitting: Internal Medicine

## 2013-08-04 ENCOUNTER — Other Ambulatory Visit: Payer: Self-pay | Admitting: Internal Medicine

## 2013-08-11 LAB — HM DIABETES EYE EXAM

## 2013-08-13 ENCOUNTER — Other Ambulatory Visit: Payer: Self-pay | Admitting: Internal Medicine

## 2013-08-14 ENCOUNTER — Encounter: Payer: Self-pay | Admitting: Internal Medicine

## 2013-09-10 ENCOUNTER — Other Ambulatory Visit: Payer: Self-pay | Admitting: Internal Medicine

## 2013-09-11 NOTE — Telephone Encounter (Signed)
Last filled 01/2013--last office visit with you 03/2013--please advise

## 2013-09-25 ENCOUNTER — Ambulatory Visit: Payer: Medicare Other | Admitting: Pulmonary Disease

## 2013-11-06 ENCOUNTER — Other Ambulatory Visit: Payer: Self-pay | Admitting: Internal Medicine

## 2013-11-14 ENCOUNTER — Other Ambulatory Visit: Payer: Self-pay | Admitting: Internal Medicine

## 2013-11-22 ENCOUNTER — Ambulatory Visit (INDEPENDENT_AMBULATORY_CARE_PROVIDER_SITE_OTHER): Payer: Medicare Other | Admitting: Internal Medicine

## 2013-11-22 ENCOUNTER — Encounter: Payer: Self-pay | Admitting: Internal Medicine

## 2013-11-22 ENCOUNTER — Ambulatory Visit (INDEPENDENT_AMBULATORY_CARE_PROVIDER_SITE_OTHER)
Admission: RE | Admit: 2013-11-22 | Discharge: 2013-11-22 | Disposition: A | Discharge status: Home / Self Care | Payer: Medicare Other | Source: Ambulatory Visit | Attending: Internal Medicine | Admitting: Internal Medicine

## 2013-11-22 ENCOUNTER — Other Ambulatory Visit (INDEPENDENT_AMBULATORY_CARE_PROVIDER_SITE_OTHER): Payer: Medicare Other

## 2013-11-22 VITALS — BP 120/82 | HR 90 | Temp 98.5°F | Wt 276.1 lb

## 2013-11-22 DIAGNOSIS — D869 Sarcoidosis, unspecified: Secondary | ICD-10-CM

## 2013-11-22 DIAGNOSIS — R05 Cough: Secondary | ICD-10-CM

## 2013-11-22 DIAGNOSIS — E119 Type 2 diabetes mellitus without complications: Secondary | ICD-10-CM

## 2013-11-22 DIAGNOSIS — R059 Cough, unspecified: Secondary | ICD-10-CM

## 2013-11-22 DIAGNOSIS — I10 Essential (primary) hypertension: Secondary | ICD-10-CM

## 2013-11-22 LAB — CBC WITH DIFFERENTIAL/PLATELET
Basophils Absolute: 0.1 10*3/uL (ref 0.0–0.1)
Basophils Relative: 0.9 % (ref 0.0–3.0)
Eosinophils Absolute: 0.2 10*3/uL (ref 0.0–0.7)
Eosinophils Relative: 2.4 % (ref 0.0–5.0)
HCT: 48.5 % (ref 39.0–52.0)
Hemoglobin: 16 g/dL (ref 13.0–17.0)
Lymphocytes Relative: 27.9 % (ref 12.0–46.0)
Lymphs Abs: 2.2 10*3/uL (ref 0.7–4.0)
MCHC: 33 g/dL (ref 30.0–36.0)
MCV: 84 fl (ref 78.0–100.0)
Monocytes Absolute: 0.6 10*3/uL (ref 0.1–1.0)
Monocytes Relative: 8 % (ref 3.0–12.0)
Neutro Abs: 4.8 10*3/uL (ref 1.4–7.7)
Neutrophils Relative %: 60.8 % (ref 43.0–77.0)
Platelets: 319 10*3/uL (ref 150.0–400.0)
RBC: 5.77 Mil/uL (ref 4.22–5.81)
RDW: 14 % (ref 11.5–15.5)
WBC: 8 10*3/uL (ref 4.0–10.5)

## 2013-11-22 LAB — LIPID PANEL
Cholesterol: 189 mg/dL (ref 0–200)
HDL: 41.5 mg/dL (ref >39.00)
LDL Cholesterol: 85 mg/dL (ref 0–99)
NonHDL: 147.5
Total CHOL/HDL Ratio: 5
Triglycerides: 315 mg/dL — ABNORMAL HIGH (ref 0.0–149.0)
VLDL: 63 mg/dL — ABNORMAL HIGH (ref 0.0–40.0)

## 2013-11-22 LAB — BASIC METABOLIC PANEL
BUN: 10 mg/dL (ref 6–23)
CO2: 29 mEq/L (ref 19–32)
Calcium: 9.8 mg/dL (ref 8.4–10.5)
Chloride: 97 mEq/L (ref 96–112)
Creatinine, Ser: 0.9 mg/dL (ref 0.4–1.5)
GFR: 124.39 mL/min (ref >60.00)
Glucose, Bld: 177 mg/dL — ABNORMAL HIGH (ref 70–99)
Potassium: 3.5 mEq/L (ref 3.5–5.1)
Sodium: 137 mEq/L (ref 135–145)

## 2013-11-22 LAB — MICROALBUMIN / CREATININE URINE RATIO
Creatinine,U: 151.7 mg/dL
Microalb Creat Ratio: 1.3 mg/g (ref 0.0–30.0)
Microalb, Ur: 2 mg/dL — ABNORMAL HIGH (ref 0.0–1.9)

## 2013-11-22 LAB — HEPATIC FUNCTION PANEL
ALT: 43 U/L (ref 0–53)
AST: 32 U/L (ref 0–37)
Albumin: 4.1 g/dL (ref 3.5–5.2)
Alkaline Phosphatase: 66 U/L (ref 39–117)
Bilirubin, Direct: 0.1 mg/dL (ref 0.0–0.3)
Total Bilirubin: 0.3 mg/dL (ref 0.2–1.2)
Total Protein: 7.4 g/dL (ref 6.0–8.3)

## 2013-11-22 LAB — HEMOGLOBIN A1C: Hgb A1c MFr Bld: 6.6 % — ABNORMAL HIGH (ref 4.6–6.5)

## 2013-11-22 LAB — TSH: TSH: 0.41 u[IU]/mL (ref 0.35–4.50)

## 2013-11-22 MED ORDER — ALBUTEROL SULFATE HFA 108 (90 BASE) MCG/ACT IN AERS: 2.0000 | INHALATION_SPRAY | Freq: Four times a day (QID) | RESPIRATORY_TRACT | Status: DC | PRN

## 2013-11-22 MED ORDER — PREDNISONE (PAK) 10 MG PO TABS: ORAL_TABLET | ORAL | Status: DC

## 2013-11-22 NOTE — Assessment & Plan Note (Signed)
Follows with rheum (mirsha - [facility]) and pulm (sood - [facility]) and neuro Craige Cotta -[facility]) for same - symptoms controlled and stable on MTX -  Prev used pred only when unable to afford MTX with insurance issues The current medical regimen is effective;  continue present plan and medications.

## 2013-11-22 NOTE — Progress Notes (Signed)
Pre visit review using our clinic review tool, if applicable. No additional management support is needed unless otherwise documented below in the visit note. 

## 2013-11-22 NOTE — Patient Instructions (Addendum)
It was good to see you today.  We have reviewed your prior records including labs and tests today  Test(s) ordered today. Your results will be released to MyChart (or called to you) after review, usually within 72hours after test completion. If any changes need to be made, you will be notified at that same time.  Medications reviewed and updated, no changes recommended at this time. If labs and chest x-ray unremarkable, we'll send prednisone course for 6 days to help with cough flare  Please schedule followup in 6 months, call sooner if problems.

## 2013-11-22 NOTE — Assessment & Plan Note (Signed)
BP Readings from Last 3 Encounters:  11/22/13 120/82  03/25/13 113/59  03/24/13 153/96   The current medical regimen is effective;  continue present plan and medications.

## 2013-11-22 NOTE — Progress Notes (Signed)
Subjective:    Patient ID: [name], male    DOB: 11-01-1970, 43 y.o.   MRN: [id number]  Cough This is a new problem. Episode onset: 2 weeks ago. The problem has been unchanged. Episode frequency: lying down. The cough is non-productive. Associated symptoms include myalgias, a rash and shortness of breath. Pertinent negatives include no fever, headaches, nasal congestion, postnasal drip, rhinorrhea or sore throat. The symptoms are aggravated by lying down. He has tried OTC cough suppressant for the symptoms. The treatment provided no relief. His past medical history is significant for asthma.  Shortness of Breath Associated symptoms include leg swelling (2 weeks ago, improved with double up on diuretic dose) and a rash. Pertinent negatives include no fever, headaches, rhinorrhea or sore throat. His past medical history is significant for asthma.    Also reviewed chronic medical issues and interval medical events  Past Medical History  Diagnosis Date  . PULMONARY SARCOIDOSIS     Mediastinal lymphadenopathy with biospy proven sarcodosis  . DIABETES MELLITUS, TYPE II   . TESTICULAR HYPOFUNCTION   . HYPERLIPIDEMIA   . Gout, unspecified   . POLYNEUROPATHY   . HYPERTENSION   . CORONARY ARTERY DISEASE   . GERD   . ERECTILE DYSFUNCTION, ORGANIC   . Edema   . TIA (transient ischemic attack)   . Arthritis     ? 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 Followed by Dr. Nickola Major  . 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 Pericarditis     recurrent  . Hypokalemia     Review of Systems  Constitutional: Positive for fatigue and unexpected weight change (gain in past 2 mo). Negative for fever.  HENT: Positive for voice change (hoarse x 2 weeks). Negative for postnasal drip, rhinorrhea, sinus pressure and sore throat.   Respiratory: Positive for cough (x 2 weeks, esp supine) and shortness of breath.   Cardiovascular: Positive for leg swelling (2  weeks ago, improved with double up on diuretic dose).  Musculoskeletal: Positive for myalgias. Negative for arthralgias.  Skin: Positive for rash.  Neurological: Negative for headaches.       Objective:   Physical Exam  BP 120/82  Pulse 90  Temp(Src) 98.5 F (36.9 C) (Oral)  Wt 276 lb 1.9 oz (125.247 kg)  SpO2 96% Wt Readings from Last 3 Encounters:  11/22/13 276 lb 1.9 oz (125.247 kg)  03/21/13 262 lb 1.9 oz (118.897 kg)  01/05/13 265 lb (120.203 kg)   Constitutional: he appears well-developed and well-nourished. No distress.  Neck: Normal range of motion. Neck supple. No JVD present. No thyromegaly present.  Cardiovascular: Normal rate, regular rhythm and normal heart sounds.  No murmur heard. No BLE edema. Pulmonary/Chest: Effort normal and breath sounds normal. No respiratory distress. he has no wheezes.  Psychiatric: he has a normal mood and affect. His behavior is normal. Judgment and thought content normal.   Lab Results  Component Value Date   WBC 5.7 03/24/2013   HGB 15.1 03/24/2013   HCT 43.3 03/24/2013   PLT 286 03/24/2013   GLUCOSE 146* 08/27/2011   CHOL 189 04/04/2012   TRIG 109.0 04/04/2012   HDL 34.60* 04/04/2012   LDLCALC 133* 04/04/2012   ALT 41 07/09/2011   AST 28 07/09/2011   NA 138 08/27/2011   K 4.2 08/27/2011   CL 100 08/27/2011   CREATININE 0.90 08/27/2011   BUN 11 08/27/2011   CO2 32 07/09/2011   TSH 0.61  07/09/2011   INR 0.99 06/24/2009   HGBA1C 6.3 09/29/2012   MICROALBUR 11.5* 07/09/2011    No results found.     Assessment & Plan:   Cough. Ongoing x2 weeks associated with myalgias but no fever or sputum. He reports increasing weight gain with edema 2 weeks ago, edema improved with self-directed increase in diuretics.  Given chronic pulmonary disease, check labs with chest x-ray today No evidence for bacterial infection so we'll hold empiric antibiotics at this time  Consider need for 2-D echo to evaluate LVEF and right heart function  If  labs and chest x-ray unremarkable, consider prednisone taper x6 days for URI-induced bronchospasm (history of asthma) - encouraged use of bronchodilator (change Xopenex to albuterol for formulary approval) as needed for cough symptoms  Problem List Items Addressed This Visit   DIABETES MELLITUS, TYPE II      Lab Results  Component Value Date   HGBA1C 6.3 09/29/2012   Historically followed with endo/ellison intermittently - improved glycemic control off pred since early 2012 Check a1c and annual microalb/cr annually - titrate meds as needed Off januvia fall 2012 and decreased dose metformin fall 2012 due to good a1c control On ASA -  declines statin or ARB    Relevant Orders      Hemoglobin A1c      Basic metabolic panel      Lipid panel      Microalbumin / creatinine urine ratio   HYPERTENSION      BP Readings from Last 3 Encounters:  11/22/13 120/82  03/25/13 113/59  03/24/13 153/96   The current medical regimen is effective;  continue present plan and medications.     Relevant Orders      Basic metabolic panel   Sarcoidosis     Follows with rheum (mirsha - [facility]) and pulm (sood - [facility]) and neuro Craige Cotta -[facility]) for same - symptoms controlled and stable on MTX -  Prev used pred only when unable to afford MTX with insurance issues The current medical regimen is effective;  continue present plan and medications.     Relevant Orders      DG Chest 2 View      CBC with Differential      Hepatic function panel      TSH    Other Visit Diagnoses   Cough    -  Primary    Relevant Orders       DG Chest 2 View       Basic metabolic panel       Lipid panel       Microalbumin / creatinine urine ratio       CBC with Differential       Hepatic function panel       TSH

## 2013-11-22 NOTE — Assessment & Plan Note (Signed)
Lab Results  Component Value Date   HGBA1C 6.3 09/29/2012   Historically followed with endo/ellison intermittently - improved glycemic control off pred since early 2012 Check a1c and annual microalb/cr annually - titrate meds as needed Off januvia fall 2012 and decreased dose metformin fall 2012 due to good a1c control On ASA -  declines statin or ARB

## 2013-11-22 NOTE — Addendum Note (Signed)
Addended by: Rene Paci A on: 11/22/2013 05:36 PM   Modules accepted: Orders

## 2013-11-23 ENCOUNTER — Telehealth: Payer: Self-pay | Admitting: Internal Medicine

## 2013-11-23 NOTE — Telephone Encounter (Signed)
Relevant patient education mailed to patient.  

## 2013-12-04 ENCOUNTER — Telehealth: Payer: Self-pay | Admitting: *Deleted

## 2013-12-04 MED ORDER — LEVETIRACETAM 500 MG PO TABS: 1000.0000 mg | ORAL_TABLET | Freq: Every day | ORAL | Status: DC

## 2013-12-04 NOTE — Telephone Encounter (Signed)
Pt states he is needing refills on his Keppra. His neurologist has been out sick and his appt with her is not until 9/31. thye will not fill med told him to contact his pcp and have her to fill until he see them in sept. Inform pt we will send enough until his appt...Raechel Chute

## 2013-12-12 ENCOUNTER — Telehealth: Payer: Self-pay

## 2013-12-12 ENCOUNTER — Other Ambulatory Visit: Payer: Self-pay | Admitting: Internal Medicine

## 2013-12-12 NOTE — Telephone Encounter (Signed)
Would need OV to review options - "Adipex" maybe an option

## 2013-12-12 NOTE — Telephone Encounter (Signed)
Wanted to know if you thought he was a candidate for the rx for weight loss called [facility]

## 2013-12-13 NOTE — Telephone Encounter (Signed)
F[facility]ed [facility] to [facility].../lmb 

## 2013-12-13 NOTE — Telephone Encounter (Signed)
Appt Aug 11.

## 2014-01-08 ENCOUNTER — Other Ambulatory Visit: Payer: Self-pay | Admitting: Internal Medicine

## 2014-01-13 ENCOUNTER — Other Ambulatory Visit: Payer: Self-pay | Admitting: Internal Medicine

## 2014-01-16 ENCOUNTER — Ambulatory Visit (INDEPENDENT_AMBULATORY_CARE_PROVIDER_SITE_OTHER)
Admission: RE | Admit: 2014-01-16 | Discharge: 2014-01-16 | Disposition: A | Discharge status: Home / Self Care | Payer: Medicare Other | Source: Ambulatory Visit | Attending: Internal Medicine | Admitting: Internal Medicine

## 2014-01-16 ENCOUNTER — Ambulatory Visit (INDEPENDENT_AMBULATORY_CARE_PROVIDER_SITE_OTHER): Payer: Medicare Other | Admitting: Internal Medicine

## 2014-01-16 ENCOUNTER — Encounter: Payer: Self-pay | Admitting: Internal Medicine

## 2014-01-16 VITALS — BP 127/79 | HR 85 | Temp 98.1°F | Ht 75.0 in | Wt 280.2 lb

## 2014-01-16 DIAGNOSIS — E669 Obesity, unspecified: Secondary | ICD-10-CM

## 2014-01-16 DIAGNOSIS — M25441 Effusion, right hand: Secondary | ICD-10-CM

## 2014-01-16 DIAGNOSIS — N529 Male erectile dysfunction, unspecified: Secondary | ICD-10-CM

## 2014-01-16 DIAGNOSIS — M25449 Effusion, unspecified hand: Secondary | ICD-10-CM

## 2014-01-16 DIAGNOSIS — Z6835 Body mass index (BMI) 35.0-35.9, adult: Secondary | ICD-10-CM | POA: Insufficient documentation

## 2014-01-16 MED ORDER — TESTOSTERONE CYPIONATE 200 MG/ML IM SOLN
200.0000 mg | INTRAMUSCULAR | Status: DC
  Administered 2014-01-16: 200 mg via INTRAMUSCULAR

## 2014-01-16 MED ORDER — SILDENAFIL CITRATE 100 MG PO TABS: 50.0000 mg | ORAL_TABLET | Freq: Every day | ORAL | Status: DC | PRN

## 2014-01-16 MED ORDER — PHENTERMINE HCL 37.5 MG PO CAPS: 37.5000 mg | ORAL_CAPSULE | ORAL | Status: DC

## 2014-01-16 NOTE — Assessment & Plan Note (Signed)
Has been prescribed Cialis by urologist Requests trial of Viagra -new prescription provided for same

## 2014-01-16 NOTE — Assessment & Plan Note (Signed)
Wt Readings from Last 3 Encounters:  01/16/14 280 lb 4 oz (127.121 kg)  11/22/13 276 lb 1.9 oz (125.247 kg)  03/21/13 262 lb 1.9 oz (118.897 kg)   Weight trends reviewed No contributing med side effects - recent labs reviewed and negative for metabolic cause Verified ongoing exercise (30"/d 5d/wk) and low carb/low fat diet Will use Phentermine to help reach weight loss goals as requested- today prescription for 1st of 3-6 months provided - we reviewed potential risk/benefit and possible side effects - pt understands and agrees to same  Pt to return in 1 month (nurse visit for weight check) before refill will be given Pt will schedule followup in 3 months for visit with me to recheck weight and review, agrees to call sooner if problems.

## 2014-01-16 NOTE — Progress Notes (Signed)
Pre visit review using our clinic review tool, if applicable. No additional management support is needed unless otherwise documented below in the visit note. 

## 2014-01-16 NOTE — Progress Notes (Signed)
Subjective:    Patient ID: [name], male    DOB: 1971/04/21, 43 y.o.   MRN: [id number]  HPI  Patient here for ?weight loss rx - adipex? Reviewed chronic medical issues and interval medical events  Past Medical History  Diagnosis Date  . PULMONARY SARCOIDOSIS     Mediastinal lymphadenopathy with biospy proven sarcodosis  . DIABETES MELLITUS, TYPE II   . TESTICULAR HYPOFUNCTION   . HYPERLIPIDEMIA   . Gout, unspecified   . POLYNEUROPATHY   . HYPERTENSION   . CORONARY ARTERY DISEASE   . GERD   . ERECTILE DYSFUNCTION, ORGANIC   . Edema   . TIA (transient ischemic attack)   . Arthritis     ? 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 Followed by Dr. Nickola Major  . Narcolepsy without cataplexy     MSLT 01/09/09 & MRI brain 01/09/09  . Pericarditis     recurrent    Review of Systems  Constitutional: Positive for unexpected weight change. Negative for fever and fatigue.  Respiratory: Negative for cough and shortness of breath.   Cardiovascular: Negative for chest pain, palpitations and leg swelling.  Musculoskeletal: Positive for joint swelling (right ring finger PIP, ?gout -unchanged despite medications >3 mo -denies injury or trauma).       Objective:   Physical Exam  BP 127/79  Pulse 85  Temp(Src) 98.1 F (36.7 C) (Oral)  Ht 6\' 3"  (1.905 m)  Wt 280 lb 4 oz (127.121 kg)  BMI 35.03 kg/m2  SpO2 96% Wt Readings from Last 3 Encounters:  01/16/14 280 lb 4 oz (127.121 kg)  11/22/13 276 lb 1.9 oz (125.247 kg)  03/21/13 262 lb 1.9 oz (118.897 kg)   Constitutional: he is obese, appears well-developed and well-nourished. No distress.  Neck: thick. Normal range of motion. Neck supple. No JVD present. No thyromegaly present.  Cardiovascular: Normal rate, regular rhythm and normal heart sounds.  No murmur heard. No BLE edema. Pulmonary/Chest: Effort normal and breath sounds normal. No respiratory distress. he has no wheezes.  MSkel: chronic joint enlargement of right ring  finger PIP. Full range of motion with normal flexion and extension. No erythema or abnormal warmth. Psychiatric: he has a normal mood and affect. His behavior is normal. Judgment and thought content normal.   Lab Results  Component Value Date   WBC 8.0 11/22/2013   HGB 16.0 11/22/2013   HCT 48.5 11/22/2013   PLT 319.0 11/22/2013   GLUCOSE 177* 11/22/2013   CHOL 189 11/22/2013   TRIG 315.0* 11/22/2013   HDL 41.50 11/22/2013   LDLCALC 85 11/22/2013   ALT 43 11/22/2013   AST 32 11/22/2013   NA 137 11/22/2013   K 3.5 11/22/2013   CL 97 11/22/2013   CREATININE 0.9 11/22/2013   BUN 10 11/22/2013   CO2 29 11/22/2013   TSH 0.41 11/22/2013   INR 0.99 06/24/2009   HGBA1C 6.6* 11/22/2013   MICROALBUR 2.0* 11/22/2013    Dg Chest 2 View  11/22/2013   CLINICAL DATA:  Cough and shortness of breath  EXAM: CHEST  2 VIEW  COMPARISON:  01/05/2013.  FINDINGS: The heart size and mediastinal contours are within normal limits. Both lungs are clear. The visualized skeletal structures are unremarkable.  IMPRESSION: No active cardiopulmonary disease.   Electronically Signed   By: 01/07/2013 M.D.   On: 11/22/2013 16:46       Assessment & Plan:   Right ring finger swelling at PIP greater than 3 months. Denies injury.  Reports rheumatologist feels related to gout. Patient placed x-ray of same to exclude other disease. Same ordered. No changes recommended. Followup with rheumatology for gout and RA -treatment as ongoing  Problem List Items Addressed This Visit   ERECTILE DYSFUNCTION, ORGANIC     Has been prescribed Cialis by urologist Requests trial of Viagra -new prescription provided for same    Obese - Primary      Wt Readings from Last 3 Encounters:  01/16/14 280 lb 4 oz (127.121 kg)  11/22/13 276 lb 1.9 oz (125.247 kg)  03/21/13 262 lb 1.9 oz (118.897 kg)   Weight trends reviewed No contributing med side effects - recent labs reviewed and negative for metabolic cause Verified ongoing exercise (30"/d 5d/wk) and  low carb/low fat diet Will use Phentermine to help reach weight loss goals as requested- today prescription for 1st of 3-6 months provided - we reviewed potential risk/benefit and possible side effects - pt understands and agrees to same  Pt to return in 1 month (nurse visit for weight check) before refill will be given Pt will schedule followup in 3 months for visit with me to recheck weight and review, agrees to call sooner if problems.    Relevant Medications      phentermine capsule    Other Visit Diagnoses   Erectile dysfunction, unspecified erectile dysfunction type        Finger joint swelling, right        Relevant Orders       DG Finger Ring Right

## 2014-01-16 NOTE — Patient Instructions (Addendum)
It was good to see you today.  We have reviewed your prior records including labs and tests today  Test(s) ordered today. Your results will be released to MyChart (or called to you) after review, usually within 72hours after test completion. If any changes need to be made, you will be notified at that same time.  Medications reviewed and updated -okay to change Cialis to Viagra as needed -prescription for same given to you today Will use Phentermine to help you reach your weight loss goals - today prescription for 1st of 3-6 months provided - If side effects or other problems, please stop medication and call us. Please return in 1 month (nurse visit for weight check) before refill will be given  Please schedule followup in 3 months for visit with me to recheck weight and review, call sooner if problems.  Exercise to Lose Weight Exercise and a healthy diet may help you lose weight. Your doctor may suggest specific exercises. EXERCISE IDEAS AND TIPS  Choose low-cost things you enjoy doing, such as walking, bicycling, or exercising to workout videos.  Take stairs instead of the elevator.  Walk during your lunch break.  Park your car further away from work or school.  Go to a gym or an exercise class.  Start with 5 to 10 minutes of exercise each day. Build up to 30 minutes of exercise 4 to 6 days a week.  Wear shoes with good support and comfortable clothes.  Stretch before and after working out.  Work out until you breathe harder and your heart beats faster.  Drink extra water when you exercise.  Do not do so much that you hurt yourself, feel dizzy, or get very short of breath. Exercises that burn about 150 calories:  Running 1  miles in 15 minutes.  Playing volleyball for 45 to 60 minutes.  Washing and waxing a car for 45 to 60 minutes.  Playing touch football for 45 minutes.  Walking 1  miles in 35 minutes.  Pushing a stroller 1  miles in 30 minutes.  Playing  basketball for 30 minutes.  Raking leaves for 30 minutes.  Bicycling 5 miles in 30 minutes.  Walking 2 miles in 30 minutes.  Dancing for 30 minutes.  Shoveling snow for 15 minutes.  Swimming laps for 20 minutes.  Walking up stairs for 15 minutes.  Bicycling 4 miles in 15 minutes.  Gardening for 30 to 45 minutes.  Jumping rope for 15 minutes.  Washing windows or floors for 45 to 60 minutes. Document Released: 06/27/2010 Document Revised: 08/17/2011 Document Reviewed: 06/27/2010 [facility] Patient Information 2015 [place], [place]. This information is not intended to replace advice given to you by your health care provider. Make sure you discuss any questions you have with your health care provider.

## 2014-02-11 ENCOUNTER — Other Ambulatory Visit: Payer: Self-pay | Admitting: Internal Medicine

## 2014-02-15 ENCOUNTER — Other Ambulatory Visit: Payer: Self-pay | Admitting: Internal Medicine

## 2014-02-16 NOTE — Telephone Encounter (Signed)
Pt came by office to weigh in and displays current weight of 263 lbs. Pt states Dr Felicity Coyer provided him with PHENTERMINE capsules 30 day supply to see if he would lose weight. Pt states he feels there is progress and would like to request more of this medication to continue weight loss. Please contact pt when request is reviewed.

## 2014-02-20 ENCOUNTER — Telehealth: Payer: Self-pay | Admitting: *Deleted

## 2014-02-20 ENCOUNTER — Other Ambulatory Visit: Payer: Self-pay | Admitting: Internal Medicine

## 2014-02-20 MED ORDER — PHENTERMINE HCL 37.5 MG PO CAPS: 37.5000 mg | ORAL_CAPSULE | ORAL | Status: DC

## 2014-02-20 NOTE — Telephone Encounter (Signed)
Done hardcopy to robin  

## 2014-02-20 NOTE — Telephone Encounter (Signed)
LVM that rx is ready for pick up in the office.

## 2014-02-20 NOTE — Telephone Encounter (Signed)
Left msg on triage stating pharmacy been trying to get refill on his phentermine x's 1 week. Came in for weight check last week & was told that they would call him when rx has been approved...Raechel Chute

## 2014-02-20 NOTE — Telephone Encounter (Signed)
Duplicate msg med already been approved, and has been notified ready for pick-up.Marland Kitchenlmb

## 2014-02-20 NOTE — Telephone Encounter (Signed)
Pls advise on refill.../lmb 

## 2014-02-20 NOTE — Telephone Encounter (Signed)
Notified pt rx ready for pick-up.../lmb 

## 2014-03-04 ENCOUNTER — Encounter (HOSPITAL_COMMUNITY): Payer: Self-pay | Admitting: Emergency Medicine

## 2014-03-04 ENCOUNTER — Emergency Department (HOSPITAL_COMMUNITY)
Admission: EM | Admit: 2014-03-04 | Discharge: 2014-03-05 | Disposition: A | Discharge status: Home / Self Care | Payer: Medicare Other | Attending: Emergency Medicine | Admitting: Emergency Medicine

## 2014-03-04 DIAGNOSIS — Z9889 Other specified postprocedural states: Secondary | ICD-10-CM | POA: Diagnosis not present

## 2014-03-04 DIAGNOSIS — F329 Major depressive disorder, single episode, unspecified: Secondary | ICD-10-CM | POA: Diagnosis not present

## 2014-03-04 DIAGNOSIS — F3289 Other specified depressive episodes: Secondary | ICD-10-CM | POA: Diagnosis not present

## 2014-03-04 DIAGNOSIS — Z008 Encounter for other general examination: Secondary | ICD-10-CM | POA: Insufficient documentation

## 2014-03-04 DIAGNOSIS — E119 Type 2 diabetes mellitus without complications: Secondary | ICD-10-CM | POA: Insufficient documentation

## 2014-03-04 DIAGNOSIS — F411 Generalized anxiety disorder: Secondary | ICD-10-CM | POA: Insufficient documentation

## 2014-03-04 DIAGNOSIS — Z79899 Other long term (current) drug therapy: Secondary | ICD-10-CM | POA: Diagnosis not present

## 2014-03-04 DIAGNOSIS — E291 Testicular hypofunction: Secondary | ICD-10-CM | POA: Diagnosis not present

## 2014-03-04 DIAGNOSIS — Z8673 Personal history of transient ischemic attack (TIA), and cerebral infarction without residual deficits: Secondary | ICD-10-CM | POA: Diagnosis not present

## 2014-03-04 DIAGNOSIS — M129 Arthropathy, unspecified: Secondary | ICD-10-CM | POA: Diagnosis not present

## 2014-03-04 DIAGNOSIS — Z7982 Long term (current) use of aspirin: Secondary | ICD-10-CM | POA: Insufficient documentation

## 2014-03-04 DIAGNOSIS — K219 Gastro-esophageal reflux disease without esophagitis: Secondary | ICD-10-CM | POA: Diagnosis not present

## 2014-03-04 DIAGNOSIS — F39 Unspecified mood [affective] disorder: Secondary | ICD-10-CM

## 2014-03-04 DIAGNOSIS — G4700 Insomnia, unspecified: Secondary | ICD-10-CM | POA: Insufficient documentation

## 2014-03-04 DIAGNOSIS — F430 Acute stress reaction: Secondary | ICD-10-CM | POA: Insufficient documentation

## 2014-03-04 DIAGNOSIS — R37 Sexual dysfunction, unspecified: Secondary | ICD-10-CM

## 2014-03-04 DIAGNOSIS — Z87891 Personal history of nicotine dependence: Secondary | ICD-10-CM | POA: Diagnosis not present

## 2014-03-04 DIAGNOSIS — I2510 Atherosclerotic heart disease of native coronary artery without angina pectoris: Secondary | ICD-10-CM | POA: Insufficient documentation

## 2014-03-04 DIAGNOSIS — E785 Hyperlipidemia, unspecified: Secondary | ICD-10-CM | POA: Insufficient documentation

## 2014-03-04 DIAGNOSIS — G63 Polyneuropathy in diseases classified elsewhere: Secondary | ICD-10-CM | POA: Insufficient documentation

## 2014-03-04 DIAGNOSIS — R45851 Suicidal ideations: Secondary | ICD-10-CM | POA: Diagnosis not present

## 2014-03-04 DIAGNOSIS — F66 Other sexual disorders: Secondary | ICD-10-CM

## 2014-03-04 LAB — CBC
HCT: 43.6 % (ref 39.0–52.0)
Hemoglobin: 15.4 g/dL (ref 13.0–17.0)
MCH: 28.2 pg (ref 26.0–34.0)
MCHC: 35.3 g/dL (ref 30.0–36.0)
MCV: 79.7 fL (ref 78.0–100.0)
Platelets: 268 10*3/uL (ref 150–400)
RBC: 5.47 MIL/uL (ref 4.22–5.81)
RDW: 14.2 % (ref 11.5–15.5)
WBC: 6.4 10*3/uL (ref 4.0–10.5)

## 2014-03-04 LAB — COMPREHENSIVE METABOLIC PANEL
ALT: 42 U/L (ref 0–53)
AST: 22 U/L (ref 0–37)
Albumin: 3.7 g/dL (ref 3.5–5.2)
Alkaline Phosphatase: 73 U/L (ref 39–117)
Anion gap: 13 (ref 5–15)
BUN: 7 mg/dL (ref 6–23)
CO2: 27 mEq/L (ref 19–32)
Calcium: 9.4 mg/dL (ref 8.4–10.5)
Chloride: 99 mEq/L (ref 96–112)
Creatinine, Ser: 0.71 mg/dL (ref 0.50–1.35)
GFR calc Af Amer: >90 mL/min (ref >90)
GFR calc non Af Amer: >90 mL/min (ref >90)
Glucose, Bld: 165 mg/dL — ABNORMAL HIGH (ref 70–99)
Potassium: 4.2 mEq/L (ref 3.7–5.3)
Sodium: 139 mEq/L (ref 137–147)
Total Bilirubin: 0.4 mg/dL (ref 0.3–1.2)
Total Protein: 7.6 g/dL (ref 6.0–8.3)

## 2014-03-04 LAB — ACETAMINOPHEN LEVEL: Acetaminophen (Tylenol), Serum: <15 ug/mL (ref 10–30)

## 2014-03-04 LAB — ETHANOL: Alcohol, Ethyl (B): <11 mg/dL (ref 0–11)

## 2014-03-04 LAB — RAPID URINE DRUG SCREEN, HOSP PERFORMED
Amphetamines: NOT DETECTED
Barbiturates: NOT DETECTED
Benzodiazepines: NOT DETECTED
Cocaine: NOT DETECTED
Opiates: NOT DETECTED
Tetrahydrocannabinol: NOT DETECTED

## 2014-03-04 LAB — SALICYLATE LEVEL: Salicylate Lvl: <2 mg/dL — ABNORMAL LOW (ref 2.8–20.0)

## 2014-03-04 MED ORDER — NICOTINE 21 MG/24HR TD PT24
21.0000 mg | MEDICATED_PATCH | Freq: Every day | TRANSDERMAL | Status: DC
  Filled 2014-03-04: qty 1

## 2014-03-04 MED ORDER — ONDANSETRON HCL 4 MG PO TABS: 4.0000 mg | ORAL_TABLET | Freq: Three times a day (TID) | ORAL | Status: DC | PRN

## 2014-03-04 MED ORDER — ALUM & MAG HYDROXIDE-SIMETH 200-200-20 MG/5ML PO SUSP: 30.0000 mL | ORAL | Status: DC | PRN

## 2014-03-04 MED ORDER — ZOLPIDEM TARTRATE 5 MG PO TABS
5.0000 mg | ORAL_TABLET | Freq: Every evening | ORAL | Status: DC | PRN
Start: 2014-03-04 — End: 2014-03-05
  Administered 2014-03-04: 5 mg via ORAL
  Filled 2014-03-04: qty 1

## 2014-03-04 MED ORDER — LORAZEPAM 1 MG PO TABS: 1.0000 mg | ORAL_TABLET | Freq: Three times a day (TID) | ORAL | Status: DC | PRN

## 2014-03-04 MED ORDER — IBUPROFEN 200 MG PO TABS: 600.0000 mg | ORAL_TABLET | Freq: Three times a day (TID) | ORAL | Status: DC | PRN

## 2014-03-04 NOTE — ED Notes (Signed)
TTS completed. 

## 2014-03-04 NOTE — ED Notes (Signed)
Patient given wine scrubs to change into

## 2014-03-04 NOTE — ED Notes (Signed)
Pt here with SI with plan to overdose; pt sts took some pills last weekend; pt sts increasing stress and depression

## 2014-03-04 NOTE — ED Provider Notes (Signed)
CSN: [id number]     Arrival date & time 03/04/14  1142 History   First MD Initiated Contact with Patient 03/04/14 1156     Chief Complaint  Patient presents with  . Medical Clearance  . Suicidal     (Consider location/radiation/quality/duration/timing/severity/associated sxs/prior Treatment) HPI Comments: Patient is a 43 year old male with a past medical history of pulmonary sarcoidosis, type 2 diabetes, hyperlipidemia, gout, polyneuropathy, hypertension, CAD, GERD, TIA, narcolepsy and pericarditis who presents to the emergency department complaining of depression and suicidal ideations. Patient reports over the past 2 years he was having an affair with another woman, this woman asked him to move in, and when he went to move in, there was another man in the home. He then went back to move in with his wife and children and told them about the affair. Over the past week he's been experiencing suicidal thoughts. One week ago he states he took a larger amount of his prescribed methotrexate and he should have. He is supposed to take 5 pills per week. He is not sure how many he took. Denies taking any other medications. Denies alcohol or drug use. Other than depression and suicidal thoughts, he denies any other complaints. No HI.  The history is provided by the patient.    Past Medical History  Diagnosis Date  . PULMONARY SARCOIDOSIS     Mediastinal lymphadenopathy with biospy proven sarcodosis  . DIABETES MELLITUS, TYPE II   . TESTICULAR HYPOFUNCTION   . HYPERLIPIDEMIA   . Gout, unspecified   . POLYNEUROPATHY   . HYPERTENSION   . CORONARY ARTERY DISEASE   . GERD   . ERECTILE DYSFUNCTION, ORGANIC   . Edema   . TIA (transient ischemic attack)   . Arthritis     ? 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 Followed by Dr. Nickola Major  . Narcolepsy without cataplexy     MSLT 01/09/09 & MRI brain 01/09/09  . Pericarditis     recurrent   Past Surgical History  Procedure Laterality Date  .  Bronchoscopy  08/21/08  . Mediastinoscopy  11/30/08  . Cardiac catheterization  06/25/2009    minimal disease   Family History  Problem Relation Age of Onset  . Diabetes Mother   . Diabetes Father   . Heart disease Father   . Hypertension Mother   . Heart failure Father    History  Substance Use Topics  . Smoking status: Former Smoker -- 15 years    Types: Cigarettes    Quit date: 07/09/2008  . Smokeless tobacco: Not on file     Comment: Married, lives with wife and 3 kids. Currently student. Prev worked as a Research officer, trade union, Engineer, materials at [employer]  . Alcohol Use: Yes     Comment: occ    Review of Systems  Psychiatric/Behavioral: Positive for suicidal ideas and dysphoric mood.  All other systems reviewed and are negative.     Allergies  Statins and Sulfonamide derivatives  Home Medications   Prior to Admission medications   Medication Sig Start Date End Date Taking? Authorizing Provider  albuterol (PROVENTIL HFA;VENTOLIN HFA) 108 (90 BASE) MCG/ACT inhaler Inhale 2 puffs into the lungs every 6 (six) hours as needed for wheezing or shortness of breath. 11/22/13   Newt Lukes, MD  allopurinol (ZYLOPRIM) 300 MG tablet TAKE 1 TABLET EVERY DAY 05/27/13   Newt Lukes, MD  amitriptyline (ELAVIL) 10 MG tablet TAKE 1 TABLET (10 MG TOTAL) BY MOUTH AT BEDTIME. 02/13/14   Raenette Rover  Felicity Coyer, MD  aspirin 325 MG tablet Take 325 mg by mouth daily.      Historical Provider, MD  atorvastatin (LIPITOR) 10 MG tablet Take 1 tablet (10 mg total) by mouth daily. 09/29/12   Newt Lukes, MD  COLCRYS 0.6 MG tablet Take 0.6 mg by mouth daily.  06/14/11   Historical Provider, MD  diltiazem (CARDIZEM CD) 120 MG 24 hr capsule TAKE 1 CAPSULE TWICE DAILY 02/13/14   Newt Lukes, MD  famotidine (PEPCID) 40 MG tablet Take 40 mg by mouth daily.    Historical Provider, MD  folic acid (FOLVITE) 1 MG tablet Take 1 mg by mouth daily.      Historical Provider, MD  furosemide (LASIX) 40 MG tablet TAKE 1  TABLET BY MOUTH EVERY DAY AS DIRECTED 11/06/13   Newt Lukes, MD  glucose blood (ONE TOUCH ULTRA TEST) test strip 1 each by Other route daily. Use as instructed 12/23/11   Newt Lukes, MD  hydrochlorothiazide (HYDRODIURIL) 25 MG tablet TAKE 1 TABLET (25 MG TOTAL) BY MOUTH DAILY. 11/14/13   Newt Lukes, MD  KLOR-CON M20 20 MEQ tablet TAKE 1 TABLET TWICE DAILY    Corwin Levins, MD  levETIRAcetam (KEPPRA) 500 MG tablet Take 2 tablets (1,000 mg total) by mouth at bedtime. 12/04/13   Newt Lukes, MD  metFORMIN (GLUCOPHAGE-XR) 500 MG 24 hr tablet TAKE 1 TABLET TWICE DAILY    Newt Lukes, MD  methotrexate (RHEUMATREX) 2.5 MG tablet Take 12.5 mg by mouth once a week. Caution:Chemotherapy. Protect from light.     Historical Provider, MD  metoprolol (LOPRESSOR) 50 MG tablet TAKE 1 TABLET BY MOUTH TWICE A DAY 01/08/14   Newt Lukes, MD  phentermine 37.5 MG capsule Take 1 capsule (37.5 mg total) by mouth every morning. 01/16/14   Newt Lukes, MD  phentermine 37.5 MG capsule Take 1 capsule (37.5 mg total) by mouth every morning. 02/20/14   Corwin Levins, MD  RABEprazole (ACIPHEX) 20 MG tablet Take 20 mg by mouth daily.    Historical Provider, MD  saw palmetto 160 MG capsule Take 160 mg by mouth daily.    Historical Provider, MD  sildenafil (VIAGRA) 100 MG tablet Take 0.5-1 tablets (50-100 mg total) by mouth daily as needed for erectile dysfunction. 01/16/14   Newt Lukes, MD  testosterone cypionate (DEPOTESTOTERONE CYPIONATE) 200 MG/ML injection INJCET INTO THE MUSCLE EVERY 14 DAYS    Newt Lukes, MD   BP 133/91  Pulse 90  Temp(Src) 98.6 F (37 C) (Oral)  Resp 18  SpO2 96% Physical Exam  Nursing note and vitals reviewed. Constitutional: He is oriented to person, place, and time. He appears well-developed and well-nourished. No distress.  HENT:  Head: Normocephalic and atraumatic.  Mouth/Throat: Oropharynx is clear and moist.  Eyes: Conjunctivae are  normal.  Neck: Normal range of motion. Neck supple.  Cardiovascular: Normal rate, regular rhythm and normal heart sounds.   Pulmonary/Chest: Effort normal and breath sounds normal.  Musculoskeletal: Normal range of motion. He exhibits no edema.  Neurological: He is alert and oriented to person, place, and time.  Skin: Skin is warm and dry. He is not diaphoretic.  Psychiatric: His behavior is normal. He exhibits a depressed mood. He expresses suicidal ideation. He expresses no homicidal ideation. He expresses suicidal plans.  Tearful.    ED Course  Procedures (including critical care time) Labs Review Labs Reviewed  COMPREHENSIVE METABOLIC PANEL - Abnormal; Notable  for the following:    Glucose, Bld 165 (*)    All other components within normal limits  SALICYLATE LEVEL - Abnormal; Notable for the following:    Salicylate Lvl <2.0 (*)    All other components within normal limits  CBC  ACETAMINOPHEN LEVEL  ETHANOL  URINE RAPID DRUG SCREEN (HOSP PERFORMED)    Imaging Review No results found.   EKG Interpretation None      MDM   Final diagnoses:  Suicidal ideations   Pt presenting with SI and plan. He is non-toxic appearing and in NAD. AFVSS. Medically cleared. TTS pending.  3:30 PM Pt accepted to [facility]. No beds available. Waiting for bed.  Trevor Mace, PA-C 03/04/14 [id number]

## 2014-03-04 NOTE — ED Notes (Signed)
PT'S SPOUSE GIVEN "8873" AS CODE D/T PT IS CONFIDENTIAL. SPOUSE DOES NOT WANT FOR HIS GIRLFRIEND TO KNOW HE IS HERE.

## 2014-03-04 NOTE — ED Notes (Signed)
Pt sitting in room, talking with sitter. Patient information about [facility] given to pt. Pt denies SI or HI at this time. Requesting to see wife. Informed him of visiting hours and that wife received information regarding [facility] visiting hours. Pt polite, cooperative at this time. Denies pain.

## 2014-03-04 NOTE — ED Provider Notes (Signed)
Medical screening examination/treatment/procedure(s) were performed by non-physician practitioner and as supervising physician I was immediately available for consultation/collaboration.   EKG Interpretation None        Shon Baton, MD 03/04/14 [phone]

## 2014-03-04 NOTE — ED Notes (Signed)
Pt.s wife given pamphlet on Mental Health policies and visitation hours.

## 2014-03-04 NOTE — BH Assessment (Signed)
Tele Assessment Note   [name] is an 43 y.o. male self-referred accompanied by his wife after reporting SI with plan to overdose on his medications.  A tele assessment was ordered.  Called Xavier Gourd, PA-C to gain clinical information and tele assessment completed at 1240.  Pt stated he has been having an affair for 2.5 years, left his wife on 02/10/14 to go live with the woman he is cheating with and found that woman with another man.  She reportedly filed a restraining order against him and he has an upcoming court date for making harassing phone calls by report on 03/15/14.  Pt stated he has been depressed, feels he needs to "disappear," and cannot contract for safety.  He stated he feels he has nothing to live for now that he doesn't have his girlfriend and his wife knows about the affair.  He stated his wife is supportive and is with him in ED.  He stated he loves her but is in love with the other woman.  Pt stated he has additional stress of his medical problems (see below) and having no job due to being on disability.  He stated he is in school for film and has one class to finish but is not motivated.  He reports increasing depressive sx and stress.  Pt stated he overdosed on some of his medication last Saturday, but didn't go to the hospital.  He stated he also overdosed as a teen for having problems with his mother.  Pt is at risk for suicide and is considered to be a danger to himself.  Pt denies HI, psychosis, or SA.  He is compliant with his medications for medical issues.  Pt is motivated for inpatient psychiatric treatment.  His only treatment in the past was counseling as a child for issues with his mother.  He lives with his wife and has three children.  Pt was calm, cooperative, oriented x 4, had depressed mood, was tearful, motivated for treatment.  Consulted with [name] @ 1310 who recommended inpatient treatment for the pt.  Updated Xavier Gourd, PA-C, ED staff and TTS  staff.  TTS to seek placement for the pt.  There are no beds at [facility] so TTS will seek placement elsewhere.  Axis I: 296.33 Major depressive disorder, Recurrent episode, Severe Axis II: Deferred Axis III:  Past Medical History  Diagnosis Date  . PULMONARY SARCOIDOSIS     Mediastinal lymphadenopathy with biospy proven sarcodosis  . DIABETES MELLITUS, TYPE II   . TESTICULAR HYPOFUNCTION   . HYPERLIPIDEMIA   . Gout, unspecified   . POLYNEUROPATHY   . HYPERTENSION   . CORONARY ARTERY DISEASE   . GERD   . ERECTILE DYSFUNCTION, ORGANIC   . Edema   . TIA (transient ischemic attack)   . Arthritis     ? 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 Followed by Dr. Nickola Major  . Narcolepsy without cataplexy     MSLT 01/09/09 & MRI brain 01/09/09  . Pericarditis     recurrent   Axis IV: other psychosocial or environmental problems, problems related to legal system/crime, problems related to social environment and problems with primary support group Axis V: 21-30 behavior considerably influenced by delusions or hallucinations OR serious impairment in judgment, communication OR inability to function in almost all areas  Past Medical History:  Past Medical History  Diagnosis Date  . PULMONARY SARCOIDOSIS     Mediastinal lymphadenopathy with biospy proven sarcodosis  . DIABETES MELLITUS, TYPE  II   . TESTICULAR HYPOFUNCTION   . HYPERLIPIDEMIA   . Gout, unspecified   . POLYNEUROPATHY   . HYPERTENSION   . CORONARY ARTERY DISEASE   . GERD   . ERECTILE DYSFUNCTION, ORGANIC   . Edema   . TIA (transient ischemic attack)   . Arthritis     ? 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 Followed by Dr. Nickola Major  . Narcolepsy without cataplexy     MSLT 01/09/09 & MRI brain 01/09/09  . Pericarditis     recurrent    Past Surgical History  Procedure Laterality Date  . Bronchoscopy  08/21/08  . Mediastinoscopy  11/30/08  . Cardiac catheterization  06/25/2009    minimal disease    Family History:   Family History  Problem Relation Age of Onset  . Diabetes Mother   . Diabetes Father   . Heart disease Father   . Hypertension Mother   . Heart failure Father     Social History:  reports that he quit smoking about 5 years ago. His smoking use included Cigarettes. He smoked 0.00 packs per day for 15 years. He does not have any smokeless tobacco history on file. He reports that he drinks alcohol. He reports that he does not use illicit drugs.  Additional Social History:  Alcohol / Drug Use Pain Medications: see med list Prescriptions: see med list Over the Counter: see med list History of alcohol / drug use?: No history of alcohol / drug abuse Longest period of sobriety (when/how long):  (na) Negative Consequences of Use:  (na) Withdrawal Symptoms:  (na)  CIWA: CIWA-Ar BP: 133/91 mmHg Pulse Rate: 90 COWS:    PATIENT STRENGTHS: (choose at least two) Ability for insight Average or above average intelligence Capable of independent living Communication skills General fund of knowledge Motivation for treatment/growth Supportive family/friends  Allergies:  Allergies  Allergen Reactions  . Statins Diarrhea  . Sulfonamide Derivatives     REACTION: rash and fever    Home Medications:  (Not in a hospital admission)  OB/GYN Status:  No LMP for male patient.  General Assessment Data Location of Assessment: [facility] ED Is this a Tele or Face-to-Face Assessment?: Tele Assessment Is this an Initial Assessment or a Re-assessment for this encounter?: Initial Assessment Living Arrangements: Spouse/significant other;Children Can pt return to current living arrangement?: Yes Admission Status: Voluntary Is patient capable of signing voluntary admission?: Yes Transfer from: Acute Hospital Referral Source: Self/Family/Friend     [facility] Crisis Care Plan Living Arrangements: Spouse/significant other;Children Name of Psychiatrist: none Name of Therapist: none  Education Status Is  patient currently in school?: No Current Grade: Copywriter, advertising Highest grade of school patient has completed: Some college Name of school: [employer] person: self  Risk to self with the past 6 months Suicidal Ideation: Yes-Currently Present Suicidal Intent: Yes-Currently Present Is patient at risk for suicide?: Yes Suicidal Plan?: Yes-Currently Present Specify Current Suicidal Plan: to overdose on medication Access to Means: Yes Specify Access to Suicidal Means: has access to medications What has been your use of drugs/alcohol within the last 12 months?: pt denies Previous Attempts/Gestures: Yes How many times?: 2 (as a teen and last week - overdose on medications) Other Self Harm Risks: na - pt denies Triggers for Past Attempts: Other personal contacts (conflict w/ mom as child, recent-girlfriend cheated on him) Intentional Self Injurious Behavior: None Family Suicide History: No Recent stressful life event(s): Conflict (Comment);Loss (Comment);Turmoil (Comment);Other (Comment) (cheated on wife, SI with attempt, medical)  Persecutory voices/beliefs?: No Depression: Yes Depression Symptoms: Despondent;Insomnia;Tearfulness;Isolating;Fatigue;Guilt;Loss of interest in usual pleasures;Feeling worthless/self pity Substance abuse history and/or treatment for substance abuse?: No Suicide prevention information given to non-admitted patients: Not applicable  Risk to Others within the past 6 months Homicidal Ideation: No Thoughts of Harm to Others: No Current Homicidal Intent: No Current Homicidal Plan: No Access to Homicidal Means: No Identified Victim: na - pt denies History of harm to others?: No Assessment of Violence: None Noted Violent Behavior Description: na-pt calm, cooperative Does patient have access to weapons?: No Criminal Charges Pending?: Yes Describe Pending Criminal Charges: Making harrassing phone calls per girlefriend Does patient have a  court date: Yes Court Date: 03/15/14  Psychosis Hallucinations: None noted Delusions: None noted  Mental Status Report Appear/Hygiene: In scrubs;Unremarkable Eye Contact: Fair Motor Activity: Freedom of movement Speech: Logical/coherent;Soft Level of Consciousness: Quiet/awake Mood: Depressed;Sad Affect: Appropriate to circumstance Anxiety Level: None Thought Processes: Coherent;Relevant Judgement: Unimpaired Orientation: Person;Place;Time;Situation;Appropriate for developmental age Obsessive Compulsive Thoughts/Behaviors: None  Cognitive Functioning Concentration: Normal Memory: Recent Intact;Remote Intact IQ: Average Insight: Poor Impulse Control: Poor Appetite: Fair Weight Loss: 17 (trying to lose weight) Weight Gain: 0 Sleep: Decreased Total Hours of Sleep:  (reports cannot sleep) Vegetative Symptoms: None  ADLScreening [facility] Assessment Services) Patient's cognitive ability adequate to safely complete daily activities?: Yes Patient able to express need for assistance with ADLs?: Yes Independently performs ADLs?: Yes (appropriate for developmental age)  Prior Inpatient Therapy Prior Inpatient Therapy: No Prior Therapy Dates: na Prior Therapy Facilty/Provider(s): na Reason for Treatment: na  Prior Outpatient Therapy Prior Outpatient Therapy: Yes Prior Therapy Dates: as a child Prior Therapy Facilty/Provider(s): unknown provider Reason for Treatment: Therapy  ADL Screening (condition at time of admission) Patient's cognitive ability adequate to safely complete daily activities?: Yes Is the patient deaf or have difficulty hearing?: No Does the patient have difficulty seeing, even when wearing glasses/contacts?: No Does the patient have difficulty concentrating, remembering, or making decisions?: No Patient able to express need for assistance with ADLs?: Yes Does the patient have difficulty dressing or bathing?: No Independently performs ADLs?: Yes (appropriate  for developmental age) Does the patient have difficulty walking or climbing stairs?: No  Home Assistive Devices/Equipment Home Assistive Devices/Equipment: None    Abuse/Neglect Assessment (Assessment to be complete while patient is alone) Physical Abuse: Denies Verbal Abuse: Denies Sexual Abuse: Denies Exploitation of patient/patient's resources: Denies Self-Neglect: Denies Values / Beliefs Cultural Requests During Hospitalization: None Spiritual Requests During Hospitalization: None Consults Spiritual Care Consult Needed: No Social Work Consult Needed: No Merchant navy officer (For Healthcare) Does patient have an advance directive?: No Would patient like information on creating an advanced directive?: No - patient declined information    Additional Information 1:1 In Past 12 Months?: No CIRT Risk: No Elopement Risk: No Does patient have medical clearance?: Yes     Disposition:  Disposition Initial Assessment Completed for this Encounter: Yes Disposition of Patient: Referred to;Inpatient treatment program Type of inpatient treatment program: Adult  Casimer Lanius, MS, [facility] Licensed Professional Counselor Therapeutic Triage Specialist [facility] Phone: [phone] Fax: [phone]  03/04/2014 1:41 PM

## 2014-03-05 DIAGNOSIS — F329 Major depressive disorder, single episode, unspecified: Secondary | ICD-10-CM

## 2014-03-05 DIAGNOSIS — F3289 Other specified depressive episodes: Secondary | ICD-10-CM

## 2014-03-05 DIAGNOSIS — F430 Acute stress reaction: Secondary | ICD-10-CM | POA: Diagnosis not present

## 2014-03-05 LAB — CBG MONITORING, ED
Glucose-Capillary: 161 mg/dL — ABNORMAL HIGH (ref 70–99)
Glucose-Capillary: 144 mg/dL — ABNORMAL HIGH (ref 70–99)

## 2014-03-05 MED ORDER — LEVETIRACETAM 500 MG PO TABS
1000.0000 mg | ORAL_TABLET | Freq: Every day | ORAL | Status: DC
  Filled 2014-03-05: qty 2

## 2014-03-05 MED ORDER — FUROSEMIDE 20 MG PO TABS
40.0000 mg | ORAL_TABLET | Freq: Every day | ORAL | Status: DC
  Administered 2014-03-05: 40 mg via ORAL
  Filled 2014-03-05: qty 2

## 2014-03-05 MED ORDER — METFORMIN HCL ER 500 MG PO TB24
500.0000 mg | ORAL_TABLET | Freq: Two times a day (BID) | ORAL | Status: DC
  Administered 2014-03-05: 500 mg via ORAL
  Filled 2014-03-05 (×2): qty 1

## 2014-03-05 MED ORDER — HYDROCHLOROTHIAZIDE 25 MG PO TABS
25.0000 mg | ORAL_TABLET | Freq: Every day | ORAL | Status: DC
  Administered 2014-03-05: 25 mg via ORAL
  Filled 2014-03-05: qty 1

## 2014-03-05 MED ORDER — METOPROLOL TARTRATE 25 MG PO TABS
50.0000 mg | ORAL_TABLET | Freq: Two times a day (BID) | ORAL | Status: DC
  Administered 2014-03-05: 50 mg via ORAL
  Filled 2014-03-05: qty 2

## 2014-03-05 MED ORDER — DILTIAZEM HCL ER COATED BEADS 120 MG PO CP24
120.0000 mg | ORAL_CAPSULE | Freq: Two times a day (BID) | ORAL | Status: DC
  Administered 2014-03-05: 120 mg via ORAL
  Filled 2014-03-05 (×2): qty 1

## 2014-03-05 MED ORDER — AMITRIPTYLINE HCL 10 MG PO TABS
10.0000 mg | ORAL_TABLET | Freq: Every day | ORAL | Status: DC
Start: 2014-03-05 — End: 2014-03-05
  Filled 2014-03-05: qty 1

## 2014-03-05 MED ORDER — ATORVASTATIN CALCIUM 10 MG PO TABS
10.0000 mg | ORAL_TABLET | Freq: Every day | ORAL | Status: DC
  Administered 2014-03-05: 10 mg via ORAL
  Filled 2014-03-05: qty 1

## 2014-03-05 MED ORDER — ALBUTEROL SULFATE HFA 108 (90 BASE) MCG/ACT IN AERS: 2.0000 | INHALATION_SPRAY | Freq: Four times a day (QID) | RESPIRATORY_TRACT | Status: DC | PRN

## 2014-03-05 NOTE — ED Provider Notes (Signed)
7:26 AM Patient has some degree of depression.  He has had vague suicidal thoughts.  He did take an intentional overdose last week.  He states currently he does not have any suicidal thoughts.  He does report that he feels depressed and feels bad about his actions of infidelity.  The patient does not want to be admitted to behavioral health hospital in [place].  He states he like to be closer for his family to visit.  I suspect the patient can be discharged home from the emergency department today and I recommended that the nursing staff speak with the psychiatrist to do a face-to-face psychiatric consultation.  He has no SI at this time.  I suspect the patient can be placed on antidepressants and followed up as an outpatient with aggressive outpatient management of his symptoms.  The patient seems to have good insight.  He is alert and oriented.  He has no psychosis.  He is very reasonable.  Lyanne Co, MD 03/05/14 [phone]

## 2014-03-05 NOTE — Consult Note (Signed)
[facility] Face-to-Face Psychiatry Consult   Reason for Consult:  depression Referring Physician:  Dr. Gery Pray is an 43 y.o. male. Total Time spent with patient: 45 minutes  Assessment: AXIS I:  Depressive Disorder NOS AXIS II:  Deferred AXIS III:   Past Medical History  Diagnosis Date  . PULMONARY SARCOIDOSIS     Mediastinal lymphadenopathy with biospy proven sarcodosis  . DIABETES MELLITUS, TYPE II   . TESTICULAR HYPOFUNCTION   . HYPERLIPIDEMIA   . Gout, unspecified   . POLYNEUROPATHY   . HYPERTENSION   . CORONARY ARTERY DISEASE   . GERD   . ERECTILE DYSFUNCTION, ORGANIC   . Edema   . TIA (transient ischemic attack)   . Arthritis     ? 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 Followed by Dr. Trudie Reed  . Narcolepsy without cataplexy     MSLT 01/09/09 & MRI brain 01/09/09  . Pericarditis     recurrent   AXIS IV:  other psychosocial or environmental problems, problems related to social environment and problems with primary support group AXIS V:  51-60 moderate symptoms  Plan: Case discussed with Dr. Ashok Cordia and staff RN Discontinue safety sitter No evidence of imminent risk to self or others at present.   Patient does not meet criteria for psychiatric inpatient admission. Supportive therapy provided about ongoing stressors. Discussed crisis plan, support from social network, calling 911, coming to the Emergency Department, and calling Suicide Hotline. Refer to out patient psychiatric treatment services and case manger will provide referral [facility] Appreciate psychiatric consultation Please contact [phone] if needs further assistance  Subjective:   [name] is a 43 y.o. male patient admitted with depression.  HPI: Patient is seen face to face for psych evaluation as he has feeling better since arrival and denies safety concern and willing to obtain out patient care and has supportive system in place. Patient reported that he  was depressed and overwhelmed about his GF of two years has affair with another guy when he has decided to move in with her. Patient stated that he has been working in [employer] and his wife is still supportive to him. Patient wife has been visiting him in emergency department. He has denied current suicidal, homicidal ideation, intention or plans. Patient is able contract for safety and willing to obtain out patient psych treatment and counseling to deal with his stress. He denied current symptoms of psychosis. He has no history of psych admissions. He lives with his wife and has three children. Pt was calm, cooperative, oriented x 4, had depressed mood and motivated for treatment. See below notes for further details or initial evaluation.  As per [facility], [name] is an 43 y.o. male self-referred accompanied by his wife after reporting SI with plan to overdose on his medications. Pt stated he has been having an affair for 2.5 years, left his wife on 02/10/14 to go live with the woman he is cheating with and found that woman with another man. She reportedly filed a restraining order against him and he has an upcoming court date for making harassing phone calls by report on 03/15/14. Pt stated he has been depressed, feels he needs to "disappear,". He stated he feels he has nothing to live for now that he doesn't have his girlfriend and his wife knows about the affair. He stated his wife is supportive and is with him in ED. He stated he loves her but is in  love with the other woman. Pt stated he has additional stress of his medical problems (see below) and having no job due to being on disability. He stated he is in school for film and has one class to finish but is not motivated. He reports increasing depressive sx and stress. Pt stated he overdosed on some of his medication last Saturday, but didn't go to the hospital. He stated he also overdosed as a teen for having problems with his mother. Pt is  at risk for suicide and is considered to be a danger to himself. Pt denies HI, psychosis, or SA. He is compliant with his medications for medical issues. Pt is motivated for inpatient psychiatric treatment. His only treatment in the past was counseling as a child for issues with his mother.   HPI Elements:   Location:  depression. Quality:  fair to poor. Severity:  heart broken but wife is supportive. Timing:  rejected by GF . Duration:  four weeks. Context:  stresses as above.  Past Psychiatric History: Past Medical History  Diagnosis Date  . PULMONARY SARCOIDOSIS     Mediastinal lymphadenopathy with biospy proven sarcodosis  . DIABETES MELLITUS, TYPE II   . TESTICULAR HYPOFUNCTION   . HYPERLIPIDEMIA   . Gout, unspecified   . POLYNEUROPATHY   . HYPERTENSION   . CORONARY ARTERY DISEASE   . GERD   . ERECTILE DYSFUNCTION, ORGANIC   . Edema   . TIA (transient ischemic attack)   . Arthritis     ? 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 Followed by Dr. Nickola Major  . Narcolepsy without cataplexy     MSLT 01/09/09 & MRI brain 01/09/09  . Pericarditis     recurrent    reports that he quit smoking about 5 years ago. His smoking use included Cigarettes. He smoked 0.00 packs per day for 15 years. He does not have any smokeless tobacco history on file. He reports that he drinks alcohol. He reports that he does not use illicit drugs. Family History  Problem Relation Age of Onset  . Diabetes Mother   . Diabetes Father   . Heart disease Father   . Hypertension Mother   . Heart failure Father    Family History Substance Abuse: No Family Supports: No (wife, mother) Living Arrangements: Spouse/significant other;Children Can pt return to current living arrangement?: Yes Abuse/Neglect [facility]) Physical Abuse: Denies Verbal Abuse: Denies Sexual Abuse: Denies Allergies:   Allergies  Allergen Reactions  . Sulfonamide Derivatives Rash and Other (See Comments)    ACT Assessment Complete:   Yes:    Educational Status    Risk to Self: Risk to self with the past 6 months Suicidal Ideation: Yes-Currently Present Suicidal Intent: Yes-Currently Present Is patient at risk for suicide?: Yes Suicidal Plan?: Yes-Currently Present Specify Current Suicidal Plan: to overdose on medication Access to Means: Yes Specify Access to Suicidal Means: has access to medications What has been your use of drugs/alcohol within the last 12 months?: pt denies Previous Attempts/Gestures: Yes How many times?: 2 (as a teen and last week - overdose on medications) Other Self Harm Risks: na - pt denies Triggers for Past Attempts: Other personal contacts (conflict w/ mom as child, recent-girlfriend cheated on him) Intentional Self Injurious Behavior: None Family Suicide History: No Recent stressful life event(s): Conflict (Comment);Loss (Comment);Turmoil (Comment);Other (Comment) (cheated on wife, SI with attempt, medical) Persecutory voices/beliefs?: No Depression: Yes Depression Symptoms: Despondent;Insomnia;Tearfulness;Isolating;Fatigue;Guilt;Loss of interest in usual pleasures;Feeling worthless/self pity Substance abuse history and/or treatment for substance  abuse?: No Suicide prevention information given to non-admitted patients: Not applicable  Risk to Others: Risk to Others within the past 6 months Homicidal Ideation: No Thoughts of Harm to Others: No Current Homicidal Intent: No Current Homicidal Plan: No Access to Homicidal Means: No Identified Victim: na - pt denies History of harm to others?: No Assessment of Violence: None Noted Violent Behavior Description: na-pt calm, cooperative Does patient have access to weapons?: No Criminal Charges Pending?: Yes Describe Pending Criminal Charges: Making harrassing phone calls per girlefriend Does patient have a court date: Yes Court Date: 03/15/14  Abuse: Abuse/Neglect Assessment (Assessment to be complete while patient is alone) Physical  Abuse: Denies Verbal Abuse: Denies Sexual Abuse: Denies Exploitation of patient/patient's resources: Denies Self-Neglect: Denies  Prior Inpatient Therapy: Prior Inpatient Therapy Prior Inpatient Therapy: No Prior Therapy Dates: na Prior Therapy Facilty/Provider(s): na Reason for Treatment: na  Prior Outpatient Therapy: Prior Outpatient Therapy Prior Outpatient Therapy: Yes Prior Therapy Dates: as a child Prior Therapy Facilty/Provider(s): unknown provider Reason for Treatment: Therapy  Additional Information: Additional Information 1:1 In Past 12 Months?: No CIRT Risk: No Elopement Risk: No Does patient have medical clearance?: Yes    Objective: Blood pressure 145/90, pulse 89, temperature 98 F (36.7 C), temperature source Oral, resp. rate 20, SpO2 100.00%.There is no weight on file to calculate BMI. Results for orders placed during the hospital encounter of 03/04/14 (from the past 72 hour(s))  CBC     Status: None   Collection Time    03/04/14  1:04 PM      Result Value Ref Range   WBC 6.4  4.0 - 10.5 K/uL   RBC 5.47  4.22 - 5.81 MIL/uL   Hemoglobin 15.4  13.0 - 17.0 g/dL   HCT 43.6  39.0 - 52.0 %   MCV 79.7  78.0 - 100.0 fL   MCH 28.2  26.0 - 34.0 pg   MCHC 35.3  30.0 - 36.0 g/dL   RDW 14.2  11.5 - 15.5 %   Platelets 268  150 - 400 K/uL  COMPREHENSIVE METABOLIC PANEL     Status: Abnormal   Collection Time    03/04/14  1:04 PM      Result Value Ref Range   Sodium 139  137 - 147 mEq/L   Potassium 4.2  3.7 - 5.3 mEq/L   Chloride 99  96 - 112 mEq/L   CO2 27  19 - 32 mEq/L   Glucose, Bld 165 (*) 70 - 99 mg/dL   BUN 7  6 - 23 mg/dL   Creatinine, Ser 0.71  0.50 - 1.35 mg/dL   Calcium 9.4  8.4 - 10.5 mg/dL   Total Protein 7.6  6.0 - 8.3 g/dL   Albumin 3.7  3.5 - 5.2 g/dL   AST 22  0 - 37 U/L   ALT 42  0 - 53 U/L   Alkaline Phosphatase 73  39 - 117 U/L   Total Bilirubin 0.4  0.3 - 1.2 mg/dL   GFR calc non Af Amer >90  >90 mL/min   GFR calc Af Amer >90  >90 mL/min    Comment: (NOTE)     The eGFR has been calculated using the CKD EPI equation.     This calculation has not been validated in all clinical situations.     eGFR's persistently <90 mL/min signify possible Chronic Kidney     Disease.   Anion gap 13  5 - 15  SALICYLATE LEVEL  Status: Abnormal   Collection Time    03/04/14  1:04 PM      Result Value Ref Range   Salicylate Lvl <7.2 (*) 2.8 - 20.0 mg/dL  ACETAMINOPHEN LEVEL     Status: None   Collection Time    03/04/14  1:04 PM      Result Value Ref Range   Acetaminophen (Tylenol), Serum <15.0  10 - 30 ug/mL   Comment:            THERAPEUTIC CONCENTRATIONS VARY     SIGNIFICANTLY. A RANGE OF 10-30     ug/mL MAY BE AN EFFECTIVE     CONCENTRATION FOR MANY PATIENTS.     HOWEVER, SOME ARE BEST TREATED     AT CONCENTRATIONS OUTSIDE THIS     RANGE.     ACETAMINOPHEN CONCENTRATIONS     >150 ug/mL AT 4 HOURS AFTER     INGESTION AND >50 ug/mL AT 12     HOURS AFTER INGESTION ARE     OFTEN ASSOCIATED WITH TOXIC     REACTIONS.  ETHANOL     Status: None   Collection Time    03/04/14  1:04 PM      Result Value Ref Range   Alcohol, Ethyl (B) <11  0 - 11 mg/dL   Comment:            LOWEST DETECTABLE LIMIT FOR     SERUM ALCOHOL IS 11 mg/dL     FOR MEDICAL PURPOSES ONLY  URINE RAPID DRUG SCREEN (HOSP PERFORMED)     Status: None   Collection Time    03/04/14  1:42 PM      Result Value Ref Range   Opiates NONE DETECTED  NONE DETECTED   Cocaine NONE DETECTED  NONE DETECTED   Benzodiazepines NONE DETECTED  NONE DETECTED   Amphetamines NONE DETECTED  NONE DETECTED   Tetrahydrocannabinol NONE DETECTED  NONE DETECTED   Barbiturates NONE DETECTED  NONE DETECTED   Comment:            DRUG SCREEN FOR MEDICAL PURPOSES     ONLY.  IF CONFIRMATION IS NEEDED     FOR ANY PURPOSE, NOTIFY LAB     WITHIN 5 DAYS.                LOWEST DETECTABLE LIMITS     FOR URINE DRUG SCREEN     Drug Class       Cutoff (ng/mL)     Amphetamine      1000      Barbiturate      200     Benzodiazepine   536     Tricyclics       644     Opiates          300     Cocaine          300     THC              50  CBG MONITORING, ED     Status: Abnormal   Collection Time    03/05/14 10:44 AM      Result Value Ref Range   Glucose-Capillary 161 (*) 70 - 99 mg/dL  CBG MONITORING, ED     Status: Abnormal   Collection Time    03/05/14 12:42 PM      Result Value Ref Range   Glucose-Capillary 144 (*) 70 - 99 mg/dL   Labs are reviewed.  Current Facility-Administered  Medications  Medication Dose Route Frequency Provider Last Rate Last Dose  . albuterol (PROVENTIL HFA;VENTOLIN HFA) 108 (90 BASE) MCG/ACT inhaler 2 puff  2 puff Inhalation Q6H PRN Mirna Mires, MD      . alum & mag hydroxide-simeth (MAALOX/MYLANTA) 200-200-20 MG/5ML suspension 30 mL  30 mL Oral PRN Illene Labrador, PA-C      . amitriptyline (ELAVIL) tablet 10 mg  10 mg Oral QHS Mirna Mires, MD      . atorvastatin (LIPITOR) tablet 10 mg  10 mg Oral Daily Mirna Mires, MD   10 mg at 03/05/14 1139  . diltiazem (CARDIZEM CD) 24 hr capsule 120 mg  120 mg Oral BID Mirna Mires, MD   120 mg at 03/05/14 1140  . furosemide (LASIX) tablet 40 mg  40 mg Oral Daily Mirna Mires, MD   40 mg at 03/05/14 1139  . hydrochlorothiazide (HYDRODIURIL) tablet 25 mg  25 mg Oral Daily Mirna Mires, MD   25 mg at 03/05/14 1140  . ibuprofen (ADVIL,MOTRIN) tablet 600 mg  600 mg Oral Q8H PRN Illene Labrador, PA-C      . levETIRAcetam (KEPPRA) tablet 1,000 mg  1,000 mg Oral QHS Mirna Mires, MD      . LORazepam (ATIVAN) tablet 1 mg  1 mg Oral Q8H PRN Illene Labrador, PA-C      . metFORMIN (GLUCOPHAGE-XR) 24 hr tablet 500 mg  500 mg Oral BID Mirna Mires, MD   500 mg at 03/05/14 1139  . metoprolol tartrate (LOPRESSOR) tablet 50 mg  50 mg Oral BID Mirna Mires, MD   50 mg at 03/05/14 1140  . nicotine (NICODERM CQ - dosed in mg/24 hours) patch 21 mg  21 mg Transdermal Daily Robyn M Albert, PA-C      .  ondansetron [facility]) tablet 4 mg  4 mg Oral Q8H PRN Illene Labrador, PA-C      . testosterone cypionate (DEPOTESTOTERONE CYPIONATE) injection 200 mg  200 mg Intramuscular Q14 Days Rowe Clack, MD   200 mg at 01/16/14 1116  . zolpidem (AMBIEN) tablet 5 mg  5 mg Oral QHS PRN Illene Labrador, PA-C   5 mg at 03/04/14 2116   Current Outpatient Prescriptions  Medication Sig Dispense Refill  . albuterol (PROVENTIL HFA;VENTOLIN HFA) 108 (90 BASE) MCG/ACT inhaler Inhale 2 puffs into the lungs every 6 (six) hours as needed for wheezing or shortness of breath.  1 Inhaler  0  . allopurinol (ZYLOPRIM) 300 MG tablet Take 300 mg by mouth daily.      Marland Kitchen amitriptyline (ELAVIL) 10 MG tablet Take 10 mg by mouth at bedtime.      Marland Kitchen atorvastatin (LIPITOR) 10 MG tablet Take 1 tablet (10 mg total) by mouth daily.  90 tablet  3  . colchicine (COLCRYS) 0.6 MG tablet Take 0.6 mg by mouth daily.      Marland Kitchen diltiazem (DILTIAZEM CD) 120 MG 24 hr capsule Take 120 mg by mouth 2 (two) times daily.      . folic acid (FOLVITE) 1 MG tablet Take 1 mg by mouth daily.       . furosemide (LASIX) 40 MG tablet Take 40 mg by mouth daily.      . hydrochlorothiazide (HYDRODIURIL) 25 MG tablet Take 25 mg by mouth daily.      Marland Kitchen levETIRAcetam (KEPPRA) 500 MG tablet Take 2 tablets (1,000 mg total) by mouth at bedtime.  60 tablet  4  . metFORMIN (GLUCOPHAGE-XR) 500 MG 24 hr tablet Take 500 mg by mouth 2 (two) times daily.      . methotrexate (RHEUMATREX) 2.5 MG tablet Take 12.5 mg by mouth once a week. Caution:Chemotherapy. Protect from light:  On Mondays      . metoprolol (LOPRESSOR) 50 MG tablet Take 50 mg by mouth 2 (two) times daily.      . phentermine 37.5 MG capsule Take 37.5 mg by mouth daily. Adipex      . potassium chloride SA (K-DUR,KLOR-CON) 20 MEQ tablet Take 20 mEq by mouth 2 (two) times daily.      . saw palmetto 160 MG capsule Take 160 mg by mouth daily.      . sildenafil (VIAGRA) 100 MG tablet Take 0.5-1 tablets (50-100 mg  total) by mouth daily as needed for erectile dysfunction.  5 tablet  11  . testosterone cypionate (DEPOTESTOTERONE CYPIONATE) 200 MG/ML injection Inject 200 mg into the muscle every 14 (fourteen) days. Last injection 02/27/14      . glucose blood (ONE TOUCH ULTRA TEST) test strip 1 each by Other route daily. Use as instructed  100 each  1    Psychiatric Specialty Exam: Physical Exam Full physical performed in Emergency Department. I have reviewed this assessment and concur with its findings.   Review of Systems  Psychiatric/Behavioral: Positive for depression. The patient is nervous/anxious and has insomnia.     Blood pressure 145/90, pulse 89, temperature 98 F (36.7 C), temperature source Oral, resp. rate 20, SpO2 100.00%.There is no weight on file to calculate BMI.  General Appearance: Casual  Eye Contact::  Good  Speech:  Clear and Coherent and Slow  Volume:  Normal  Mood:  Anxious and Depressed  Affect:  Appropriate and Congruent  Thought Process:  Coherent and Goal Directed  Orientation:  Full (Time, Place, and Person)  Thought Content:  Rumination  Suicidal Thoughts:  No  Homicidal Thoughts:  No  Memory:  Immediate;   Good Recent;   Good  Judgement:  Fair  Insight:  Fair  Psychomotor Activity:  Decreased  Concentration:  Good  Recall:  Good  Fund of Knowledge:Good  Language: Good  Akathisia:  NA  Handed:  Right  AIMS (if indicated):     Assets:  Communication Skills Desire for Improvement Housing Leisure Time Resilience Social Support Talents/Skills Transportation  Sleep:      Musculoskeletal: Strength & Muscle Tone: within normal limits Gait & Station: normal Patient leans: N/A  Treatment Plan Summary: Daily contact with patient to assess and evaluate symptoms and progress in treatment Medication management Refer to out patient psych treatment with supportive family and contract for safety and has no current safety issues.    Blimi Godby,JANARDHAHA  R. 03/05/2014 2:39 PM

## 2014-03-05 NOTE — ED Notes (Signed)
Pt up to use phone.

## 2014-03-05 NOTE — ED Notes (Signed)
Per dr Patria Mane pt  Needs help  But does not want to go all the way to [facility] to be so far from family pt states just is depressed

## 2014-03-05 NOTE — ED Provider Notes (Signed)
Psychiatrist, Dr Elsie Saas, has seen and evaluated.  He states pt psychiatrically stable for d/c, and has provided referrals to [facility].  States pt having no thoughts of harm to self, or others, and contracts for safety.  Pt alert, cooperative. Normal mood/affect.      Suzi Roots, MD 03/05/14 1440

## 2014-03-05 NOTE — ED Notes (Signed)
Spoke to [employer], house coverage at [facility] and dr to  Re eval pt will be down sometime today

## 2014-03-05 NOTE — Discharge Instructions (Signed)
It was our pleasure to provide your ER care today - we hope that you feel better.  For mental health issues and/or crisis, go directly to [facility]. For medical care, follow up with primary care doctor.  Return to ER if worse, severe depression, thoughts of self harm, medical emergency, other concern.     Depression Depression refers to feeling sad, low, down in the dumps, blue, gloomy, or empty. In general, there are two kinds of depression: 1. Normal sadness or normal grief. This kind of depression is one that we all feel from time to time after upsetting life experiences, such as the loss of a job or the ending of a relationship. This kind of depression is considered normal, is short lived, and resolves within a few days to 2 weeks. Depression experienced after the loss of a loved one (bereavement) often lasts longer than 2 weeks but normally gets better with time. 2. Clinical depression. This kind of depression lasts longer than normal sadness or normal grief or interferes with your ability to function at home, at work, and in school. It also interferes with your personal relationships. It affects almost every aspect of your life. Clinical depression is an illness. Symptoms of depression can also be caused by conditions other than those mentioned above, such as:  Physical illness. Some physical illnesses, including underactive thyroid gland (hypothyroidism), severe anemia, specific types of cancer, diabetes, uncontrolled seizures, heart and lung problems, strokes, and chronic pain are commonly associated with symptoms of depression.  Side effects of some prescription medicine. In some people, certain types of medicine can cause symptoms of depression.  Substance abuse. Abuse of alcohol and illicit drugs can cause symptoms of depression. SYMPTOMS Symptoms of normal sadness and normal grief include the following:  Feeling sad or crying for short periods of time.  Not caring about anything  (apathy).  Difficulty sleeping or sleeping too much.  No longer able to enjoy the things you used to enjoy.  Desire to be by oneself all the time (social isolation).  Lack of energy or motivation.  Difficulty concentrating or remembering.  Change in appetite or weight.  Restlessness or agitation. Symptoms of clinical depression include the same symptoms of normal sadness or normal grief and also the following symptoms:  Feeling sad or crying all the time.  Feelings of guilt or worthlessness.  Feelings of hopelessness or helplessness.  Thoughts of suicide or the desire to harm yourself (suicidal ideation).  Loss of touch with reality (psychotic symptoms). Seeing or hearing things that are not real (hallucinations) or having false beliefs about your life or the people around you (delusions and paranoia). DIAGNOSIS  The diagnosis of clinical depression is usually based on how bad the symptoms are and how long they have lasted. Your health care provider will also ask you questions about your medical history and substance use to find out if physical illness, use of prescription medicine, or substance abuse is causing your depression. Your health care provider may also order blood tests. TREATMENT  Often, normal sadness and normal grief do not require treatment. However, sometimes antidepressant medicine is given for bereavement to ease the depressive symptoms until they resolve. The treatment for clinical depression depends on how bad the symptoms are but often includes antidepressant medicine, counseling with a mental health professional, or both. Your health care provider will help to determine what treatment is best for you. Depression caused by physical illness usually goes away with appropriate medical treatment of the illness. If  prescription medicine is causing depression, talk with your health care provider about stopping the medicine, decreasing the dose, or changing to another  medicine. Depression caused by the abuse of alcohol or illicit drugs goes away when you stop using these substances. Some adults need professional help in order to stop drinking or using drugs. SEEK IMMEDIATE MEDICAL CARE IF:  You have thoughts about hurting yourself or others.  You lose touch with reality (have psychotic symptoms).  You are taking medicine for depression and have a serious side effect. FOR MORE INFORMATION  National Alliance on Mental Illness: www.nami.[employer] of Mental Health: http://www.maynard.net/ Document Released: 05/22/2000 Document Revised: 10/09/2013 Document Reviewed: 08/24/2011 [facility] Patient Information 2015 [place], [place]. This information is not intended to replace advice given to you by your health care provider. Make sure you discuss any questions you have with your health care provider.      Emergency Department Resource Guide 1) Find a Doctor and Pay Out of Pocket Although you won't have to find out who is covered by your insurance plan, it is a good idea to ask around and get recommendations. You will then need to call the office and see if the doctor you have chosen will accept you as a new patient and what types of options they offer for patients who are self-pay. Some doctors offer discounts or will set up payment plans for their patients who do not have insurance, but you will need to ask so you aren't surprised when you get to your appointment.  2) Contact Your Local Health Department Not all health departments have doctors that can see patients for sick visits, but many do, so it is worth a call to see if yours does. If you don't know where your local health department is, you can check in your phone book. The CDC also has a tool to help you locate your state's health department, and many state websites also have listings of all of their local health departments.  3) Find a Walk-in Clinic If your illness is not likely to be very  severe or complicated, you may want to try a walk in clinic. These are popping up all over the country in pharmacies, drugstores, and shopping centers. They're usually staffed by nurse practitioners or physician assistants that have been trained to treat common illnesses and complaints. They're usually fairly quick and inexpensive. However, if you have serious medical issues or chronic medical problems, these are probably not your best option.  No Primary Care Doctor: - Call Health Connect at  [phone] - they can help you locate a primary care doctor that  accepts your insurance, provides certain services, etc. - Physician Referral Service- [phone]  Chronic Pain Problems: Organization         Address  Phone   Notes  [facility]  [phone] Patients need to be referred by their primary care doctor.   Medication Assistance: Organization         Address  Phone   Notes  [facility] Medication [facility] [address] [place] [place], [place] [address] [phone] --Must be a resident of [facility] -- Must have NO insurance coverage whatsoever (no Medicaid/ Medicare, etc.) -- The pt. MUST have a primary care doctor that directs their care regularly and follows them in the community   Med[facility]ist  [phone]   [employer]  [phone]    Agencies that provide inexpensive medical care: Organization  Address  Phone   Notes  [place]  [phone]   [facility]    [phone]   [facility] [place], [place] [address] [phone]   [place] [address] [place], [place] [phone]   Planned Parenthood    [phone]   [facility]    [phone]   [place] and [place] [address], [place] Phone:  [phone], Fax:  [phone] Hours of Operation:  9 am - 6 pm, M-F.  Also accepts  Medicaid/Medicare and self-pay.  [facility] for [place], [address], [place] Phone: [phone], Fax: [phone]. Hours of Operation:  8:30 am - 5:30 pm, M-F.  Also accepts Medicaid and self-pay.  [facility] [place] [address] [place] Phone: [phone]   [place], [place], [place] [phone], Ext. [phone] Mondays & Thursdays: 7-9 AM.  First 15 patients are seen on a first come, first serve basis.    [place] Providers:  Organization         Address  Phone   Notes  [facility] [address] [place] [address], [place] [phone] Also accepts self-pay patients.  [facility] [place] [address] [place], [place]  [phone]   [place], [address], [place] [phone]   [facility] [address] [place] [phone]   Lucianne Lei [address], [place]   [phone] Only accepts [place] Access [place] patients after they have their name applied to their card.   Self-Pay (no insurance) in [facility]:  Organization         Address  Phone   Notes  Sickle Cell Patients, [facility] [place] [phone]   [facility] [place] [phone]   [facility] [place]  [place], [place],  [phone]   [facility]/Dr. Osei-Bonsu  [address] [place] or [address], [place] [phone] Phone number for both [place] and [place] locations is the same.  Urgent Medical and [facility] [address], [place] [phone]   [facility] [address] [place] or [address] [phone]   [facility] [address], [place] [phone], phone; [phone], fax Sees patients 1st and 3rd Saturday of every month.  Must not qualify for public or private insurance (i.e. Medicaid, Medicare, [place] Health Choice, Veterans' Benefits)  Household income should be no more than 200% of the poverty level The clinic cannot treat you if you are pregnant or think you are pregnant  Sexually transmitted diseases are not treated at the clinic.    Dental Care: Organization         Address  Phone  Notes  [facility] Department of [facility] [place] [phone] Accepts children up to age 83 who are enrolled in [place] or [place]; pregnant women with a Medicaid card; and children who have applied for Medicaid or [place] Health Choice, but were declined, whose parents can pay a reduced fee at time  of service.  [facility] Department of [facility]  [address], [place] [phone] Accepts children up to age 40 who are enrolled in [place] or [place] Health Choice; pregnant women with a Medicaid card; and children who have applied for Medicaid or [place] Health Choice, but were declined, whose parents can pay a reduced fee at time of service.  [facility]  [address] [place], [place] [phone] Patients are seen by appointment only. Walk-ins are not accepted. [facility] will see patients 61 years of age and older. Monday - Tuesday (8am-5pm) Most Wednesdays (8:30-5pm) $30 per visit, cash only  [facility] [place] [facility] Access [facility]  [address], [facility] [phone] Patients are seen by appointment only. Walk-ins are not accepted. [facility] will see patients 57 years of age and older. One Wednesday Evening (Monthly: Volunteer Based).  $30 per visit, cash only  [employer]  [phone] for adults; Children under age 60, call Graduate Pediatric Dentistry at [phone]. Children aged  12-14, please call [phone] to request a pediatric application.  Dental services are provided in all areas of dental care including fillings, crowns and bridges, complete and partial dentures, implants, gum treatment, root canals, and extractions. Preventive care is also provided. Treatment is provided to both adults and children. Patients are selected via a lottery and there is often a waiting list.   [facility] [address] [place]  [phone] [email or web address]   [facility] [address] [place], [place] [phone], Ext. [phone] Second and Fourth Thursday of each month, opens at 6:30 AM; Clinic ends at 9 AM.  Patients are seen on a first-come first-served basis, and a limited number are seen during each clinic.   [facility]  [address] [place], [place] [phone]   Eligibility Requirements You must have lived in [place], [place], or [place] counties for at least the last three months.   You cannot be eligible for state or federal sponsored [place], including [employer], [place], or [employer].   You generally cannot be eligible for healthcare insurance through your employer.    How to apply: Eligibility screenings are held every Tuesday and Wednesday afternoon from 1:00 pm until 4:00 pm. You do not need an appointment for the interview!  [facility] [address], [place], [place] [phone]   [facility]  [phone]   [facility] Health Department  [phone]   [facility]  [phone]    Behavioral Health Resources in the Community: Intensive Outpatient Programs Organization         Address  Phone  Notes  [facility] Services [address] [place], [place] [phone]   [facility] Outpatient [address], [place], [place] [phone]   [facility]: [facility] [address],  [place], [place]  [phone]   [facility] Mental Health [address]  [place], [place] [phone] or [phone]   Substance Abuse Resources Organization         Address  Phone  Notes  Al[facility] Drug Services  [phone]   [facility]  [phone]   [facility] [place]  [phone]   Floydene Flock  [phone]   Residential & Outpatient Substance Abuse Program  [phone]   Psychological Services Organization         Address  Phone  Notes  [facility] [place]  [place]  [phone]   [place] [address] [place] or [phone]    Mobile Crisis Teams Organization         Address  Phone  Notes  [facility], Mobile Crisis Care Unit  [phone]   [facility]  [address] [place], [place]   [place] Levels [address] [place] [phone]    Self-Help/Support Groups Organization         Address  Phone             Notes  [place]. of [place] - variety of support groups  [place] Call for more information  Narcotics Anonymous (NA), [facility] [address], [employer] [place]  2 meetings at this location   Special educational needs teacher         Address  Phone  Notes  [facility] [place],    [place]  [phone]   [facility]  [address], [place] [address], [place], [place]   [place], [place] [phone] Admissions: 8am-3pm M-F  Incentives Substance [place] [address],    [place], [place] [phone]   [facility] [address] [place], [place], [place]   [facility] [address],  [place], [place]   [facility] - Intensive Outpatient [address], [place], [place]   [facility]  ([place]  [place], [place] [phone] or [phone]   [facility] Services (RTS) [address] [place], [place] Accepts Medicaid  [facility] [place] [address]  [place] [phone] Substance Abuse/Addiction Treatment   [facility] Organization         Address  Phone  Notes  [facility]  [phone]   Domenic Schwab, PhD [address] [place], [place]   [phone] or [phone]   [place], [place] [phone]   [facility] [address] [place], [place] [phone] Insurance/Medicaid/sponsorship through [facility] and Families [address], [address] [place]                                    [place], [place] [phone] [place] [address], [place] [phone]    Dr. Adele Schilder  [phone]   [facility] [place] Dept. 1) [address] [place] 2) [place] 3)  [place] [address], [place] [phone]  [phone]   [place] [phone] or [phone] (After Hours)

## 2014-03-05 NOTE — ED Notes (Signed)
Pt up to shower

## 2014-03-05 NOTE — Progress Notes (Signed)
MHT spoke with RN on Pod C at [facility].  Pt has been [facility] to Dr. Shawnie Dapper to room 109.  RN report [phone].  Pt has to be IVC to be transported to hospital.  Rn agreed to complete IVC paperwork to have pt transported at noon.  Blain Pais, MHT/NS

## 2014-03-05 NOTE — ED Notes (Signed)
Pt states he is aware that he has "stuffed down" a lot of problems  And is ready to seek out patient therapy.  Pt states he is ready to go home and no longer has SI.

## 2014-03-08 ENCOUNTER — Encounter: Payer: Self-pay | Admitting: Internal Medicine

## 2014-03-08 ENCOUNTER — Ambulatory Visit (INDEPENDENT_AMBULATORY_CARE_PROVIDER_SITE_OTHER): Payer: Medicare Other | Admitting: Internal Medicine

## 2014-03-08 ENCOUNTER — Other Ambulatory Visit: Payer: Medicare Other

## 2014-03-08 VITALS — BP 112/70 | HR 80 | Temp 98.3°F | Resp 13 | Wt 254.5 lb

## 2014-03-08 DIAGNOSIS — Z23 Encounter for immunization: Secondary | ICD-10-CM

## 2014-03-08 DIAGNOSIS — R072 Precordial pain: Secondary | ICD-10-CM

## 2014-03-08 DIAGNOSIS — F329 Major depressive disorder, single episode, unspecified: Secondary | ICD-10-CM

## 2014-03-08 DIAGNOSIS — F32A Depression, unspecified: Secondary | ICD-10-CM

## 2014-03-08 DIAGNOSIS — I25118 Atherosclerotic heart disease of native coronary artery with other forms of angina pectoris: Secondary | ICD-10-CM

## 2014-03-08 MED ORDER — FLUOXETINE HCL 20 MG PO TABS: 20.0000 mg | ORAL_TABLET | Freq: Every day | ORAL | Status: DC

## 2014-03-08 NOTE — Patient Instructions (Signed)
Your next office appointment will be determined based upon review of your pending labs . Those instructions will be transmitted to you through My Chart  OR  by mail;whichever process is your choice to receive results & recommendations .  Followup as needed for your acute issue. Please report any significant change in your symptoms. The Psychiatry  referral will be scheduled and you'll be notified of the time.

## 2014-03-08 NOTE — Progress Notes (Signed)
Pre visit review using our clinic review tool, if applicable. No additional management support is needed unless otherwise documented below in the visit note. 

## 2014-03-08 NOTE — Progress Notes (Signed)
   Subjective:    Patient ID: DEMIR TITSWORTH, male    DOB: August 23, 1970, 43 y.o.   MRN: [id number]  HPI    Apparently he exhibited symptoms suggestive of suicidal ideation in the context of an affair which imploded. He has returned to his family.   The emergency room records 9/27-9/27/15 were reviewed. A psychiatric consultant recommended outpatient followup; but none was scheduled. Comprehensive metabolic profile  was normal except for glucose of 165. CBC and differential was normal. Despite ingesting higher dose of methotrexate ; liver function tests were normal. He was not felt to be suicidal after monitor in the ER  and denies such at this time.   He has a complicated medical history with polypharmacy.   His last A1c exhibited excellent control at 6.6% on 11/22/13.   Review of Systems As of 9/28 he's had intermittent exertional left upper sternal border area chest discomfort. This was associated with diaphoresis and dizziness. Last night he had some discomfort in the left upper extremity as well. He had some chest discomfort dressing prior to today's OV. He has not seen his cardiologist for > a year.     Objective:   Physical Exam  Gen.:  well-nourished; in no acute distress Eyes: Extraocular motion intact; no lid lag or proptosis ,nystagmus Neck: full ROM; no masses ; thyroid normal  Heart: Normal rhythm and rate without significant murmur, gallop, or extra heart sounds Lungs: Chest clear to auscultation without rales,rales, wheezes Neuro:Deep tendon reflexes are equal and within normal limits; no tremor  Skin: Warm and dry without significant lesions or rashes; no onycholysis Lymphatic: no cervical or axillary LA Psych: Normally communicative and interactive; no abnormal mood or affect clinically today.           Assessment & Plan:  #1 depression , situational #2 chest pain in context CAD See orders

## 2014-03-09 LAB — TROPONIN I: Troponin I: 0.01 ng/mL (ref <0.06)

## 2014-03-19 ENCOUNTER — Telehealth: Payer: Self-pay | Admitting: Internal Medicine

## 2014-03-19 DIAGNOSIS — F329 Major depressive disorder, single episode, unspecified: Secondary | ICD-10-CM

## 2014-03-19 DIAGNOSIS — F32A Depression, unspecified: Secondary | ICD-10-CM

## 2014-03-19 MED ORDER — METOPROLOL TARTRATE 50 MG PO TABS: 50.0000 mg | ORAL_TABLET | Freq: Two times a day (BID) | ORAL | Status: DC

## 2014-03-19 MED ORDER — FLUOXETINE HCL 20 MG PO TABS: 20.0000 mg | ORAL_TABLET | Freq: Every day | ORAL | Status: DC

## 2014-03-19 MED ORDER — FUROSEMIDE 40 MG PO TABS: 40.0000 mg | ORAL_TABLET | Freq: Every day | ORAL | Status: DC

## 2014-03-19 MED ORDER — METFORMIN HCL ER 500 MG PO TB24: 500.0000 mg | ORAL_TABLET | Freq: Two times a day (BID) | ORAL | Status: DC

## 2014-03-19 MED ORDER — HYDROCHLOROTHIAZIDE 25 MG PO TABS: 25.0000 mg | ORAL_TABLET | Freq: Every day | ORAL | Status: DC

## 2014-03-19 NOTE — Telephone Encounter (Signed)
Notified [facility]...Raechel Chute

## 2014-03-19 NOTE — Telephone Encounter (Signed)
Pt called request for : Amitriptyline, Fluoxetine, Furosemide, Hydrochlorothiazide, Metformin and Metoprolol to be [facility] on [address], pt stated it will cost him less. Please advise.

## 2014-03-27 ENCOUNTER — Ambulatory Visit (INDEPENDENT_AMBULATORY_CARE_PROVIDER_SITE_OTHER): Payer: No Typology Code available for payment source | Admitting: Licensed Clinical Social Worker

## 2014-03-27 DIAGNOSIS — F321 Major depressive disorder, single episode, moderate: Secondary | ICD-10-CM

## 2014-03-28 ENCOUNTER — Telehealth: Payer: Self-pay | Admitting: *Deleted

## 2014-03-28 MED ORDER — PHENTERMINE HCL 37.5 MG PO CAPS: 37.5000 mg | ORAL_CAPSULE | Freq: Every day | ORAL | Status: DC

## 2014-03-28 NOTE — Telephone Encounter (Signed)
Notified pt rx ready for pick-up. Place in cabinet.../lmb 

## 2014-03-28 NOTE — Telephone Encounter (Signed)
Will not approve as he also has CAD and I do not see that we have prescribed it for him. Duration only approved for 3 months for phentermine anyway.

## 2014-03-28 NOTE — Addendum Note (Signed)
Addended by: Genella Mech A on: 03/28/2014 11:29 AM   Modules accepted: Orders

## 2014-03-28 NOTE — Telephone Encounter (Signed)
Left msg on triage requesting refill on his phentermine. MD is out pls advise...Raechel Chute

## 2014-03-28 NOTE — Telephone Encounter (Signed)
Have printed and signed. 

## 2014-03-28 NOTE — Telephone Encounter (Signed)
Notified pt with md response. Pt stated that this will be his 3rd script Dr. Felicity Coyer has been prescribing it. He stated he came in back in Aug (8/11 to talk about taking med. She did inform him that she will rx for 90 day only...Raechel Chute

## 2014-04-11 ENCOUNTER — Ambulatory Visit: Payer: No Typology Code available for payment source | Admitting: Licensed Clinical Social Worker

## 2014-04-16 ENCOUNTER — Institutional Professional Consult (permissible substitution): Payer: Self-pay | Admitting: Cardiology

## 2014-04-18 ENCOUNTER — Ambulatory Visit: Payer: Medicare Other | Admitting: Internal Medicine

## 2014-05-14 ENCOUNTER — Encounter: Payer: Self-pay | Admitting: Cardiology

## 2014-05-14 ENCOUNTER — Encounter: Payer: Self-pay | Admitting: *Deleted

## 2014-05-14 ENCOUNTER — Ambulatory Visit (INDEPENDENT_AMBULATORY_CARE_PROVIDER_SITE_OTHER): Payer: Medicare Other | Admitting: Cardiology

## 2014-05-14 VITALS — BP 124/80 | HR 86 | Ht 75.0 in | Wt 253.0 lb

## 2014-05-14 DIAGNOSIS — D869 Sarcoidosis, unspecified: Secondary | ICD-10-CM

## 2014-05-14 DIAGNOSIS — R079 Chest pain, unspecified: Secondary | ICD-10-CM

## 2014-05-14 DIAGNOSIS — I10 Essential (primary) hypertension: Secondary | ICD-10-CM

## 2014-05-14 DIAGNOSIS — Z01812 Encounter for preprocedural laboratory examination: Secondary | ICD-10-CM

## 2014-05-14 DIAGNOSIS — E1169 Type 2 diabetes mellitus with other specified complication: Secondary | ICD-10-CM

## 2014-05-14 DIAGNOSIS — E785 Hyperlipidemia, unspecified: Secondary | ICD-10-CM

## 2014-05-14 LAB — CBC WITH DIFFERENTIAL/PLATELET
Basophils Absolute: 0 10*3/uL (ref 0.0–0.1)
Basophils Relative: 0.5 % (ref 0.0–3.0)
Eosinophils Absolute: 0.3 10*3/uL (ref 0.0–0.7)
Eosinophils Relative: 4.6 % (ref 0.0–5.0)
HCT: 50.4 % (ref 39.0–52.0)
Hemoglobin: 16.9 g/dL (ref 13.0–17.0)
Lymphocytes Relative: 42.7 % (ref 12.0–46.0)
Lymphs Abs: 2.5 10*3/uL (ref 0.7–4.0)
MCHC: 33.6 g/dL (ref 30.0–36.0)
MCV: 81.2 fl (ref 78.0–100.0)
Monocytes Absolute: 0.6 10*3/uL (ref 0.1–1.0)
Monocytes Relative: 10.8 % (ref 3.0–12.0)
Neutro Abs: 2.4 10*3/uL (ref 1.4–7.7)
Neutrophils Relative %: 41.4 % — ABNORMAL LOW (ref 43.0–77.0)
Platelets: 310 10*3/uL (ref 150.0–400.0)
RBC: 6.2 Mil/uL — ABNORMAL HIGH (ref 4.22–5.81)
RDW: 14.6 % (ref 11.5–15.5)
WBC: 5.8 10*3/uL (ref 4.0–10.5)

## 2014-05-14 LAB — BASIC METABOLIC PANEL
BUN: 9 mg/dL (ref 6–23)
CO2: 31 mEq/L (ref 19–32)
Calcium: 9.6 mg/dL (ref 8.4–10.5)
Chloride: 98 mEq/L (ref 96–112)
Creatinine, Ser: 0.8 mg/dL (ref 0.4–1.5)
GFR: 142.88 mL/min (ref >60.00)
Glucose, Bld: 121 mg/dL — ABNORMAL HIGH (ref 70–99)
Potassium: 3.9 mEq/L (ref 3.5–5.1)
Sodium: 138 mEq/L (ref 135–145)

## 2014-05-14 LAB — PROTIME-INR
INR: 1 ratio (ref 0.8–1.0)
Prothrombin Time: 11 s (ref 9.6–13.1)

## 2014-05-14 MED ORDER — ASPIRIN EC 81 MG PO TBEC: 81.0000 mg | DELAYED_RELEASE_TABLET | Freq: Once | ORAL | Status: DC

## 2014-05-14 MED ORDER — NITROGLYCERIN 0.4 MG SL SUBL: 0.4000 mg | SUBLINGUAL_TABLET | SUBLINGUAL | Status: DC | PRN

## 2014-05-14 NOTE — Patient Instructions (Addendum)
Your physician has recommended you make the following change in your medication:   START TAKING ASPIRIN 81 MG DAILY   DR NELSON HAS PRESCRIBED YOU NITROGLYCERIN 0.4 MG TO TAKE ONLY AS NEEDED FOR CHEST PAIN- YOU CAN TAKE 1 TABLET EVERY 5 MINS BUT NO MORE THAN 3 TABLETS.  PLEASE DO NOT TAKE VIAGRA AND NITROGLYCERIN TOGETHER.  YOU MUST BE OFF OF THE VIAGRA 24 HOURS PRIOR TO TAKING NITROGLYCERIN   Your physician recommends that you return for lab work in: TODAY (PT/INR, BMET, CBC W DIFF)   Your physician has requested that you have a cardiac catheterization. Cardiac catheterization is used to diagnose and/or treat various heart conditions. Doctors may recommend this procedure for a number of different reasons. The most common reason is to evaluate chest pain. Chest pain can be a symptom of coronary artery disease (CAD), and cardiac catheterization can show whether plaque is narrowing or blocking your heart's arteries. This procedure is also used to evaluate the valves, as well as measure the blood flow and oxygen levels in different parts of your heart. For further information please visit [email or web address] Please follow instruction sheet, as given.  CATH WILL BE DONE ON Friday 05/18/14 AT 7:30 AM AND YOU MUST BE THERE AT 5:30 AM   PLEASE FOLLOW YOUR LETTER OF INSTRUCTION CAREFULLY

## 2014-05-14 NOTE — Progress Notes (Signed)
Patient ID: [name], male   DOB: 10/26/1970, 43 y.o.   MRN: [id number]      Reason for office visit Chest pain, known CAD  [name] is a 43-year-old gentleman with prior medical history of pulmonary sarcoidosis, diabetes, obesity, hypertension, hyperlipidemia, significant family history of premature coronary artery disease and rheumatoid arthritis, who is coming for follow-up visit for chest pain. Patient underwent cardiac catheterization in 2005 that showed nonobstructive CAD.  He has been having different medical problems recently on and off on prednisone for pulmonary sarcoidosis currently only on methotrexate. He has significant mixed hyperlipidemia however he was unable to tolerate atorvastatin as it gave him significant muscle pain. He has been experiencing typical chest pain on exertion. The first episode was 2 weeks ago when he was caring heavy object at work developed significant chest pain associated shortness of breath, diaphoresis and nausea that resolved within 45 minutes. He didn't seek medical care and ran out of sublingual nitroglycerin. Since then he has been experiencing chest pain with minimal pain that resolved with rest. He also feels palpitations are occasionally and no syncope. He denies any orthopnea or proximal nocturnal dyspnea.  His family history is very positive for premature coronary artery disease, his father died of myocardial infarction at age of 46. Multiple uncles and aunts on his father's side died in her 40s secondary to myocardial infarction.  Allergies  Allergen Reactions  . Sulfonamide Derivatives Rash and Other (See Comments)    Current Outpatient Prescriptions  Medication Sig Dispense Refill  . albuterol (PROVENTIL HFA;VENTOLIN HFA) 108 (90 BASE) MCG/ACT inhaler Inhale 2 puffs into the lungs every 6 (six) hours as needed for wheezing or shortness of breath. 1 Inhaler 0  . allopurinol (ZYLOPRIM) 300 MG tablet Take 300 mg by mouth daily.    .  amitriptyline (ELAVIL) 10 MG tablet Take 10 mg by mouth at bedtime.    . atorvastatin (LIPITOR) 10 MG tablet Take 1 tablet (10 mg total) by mouth daily. 90 tablet 3  . colchicine (COLCRYS) 0.6 MG tablet Take 0.6 mg by mouth daily.    . diltiazem (DILTIAZEM CD) 120 MG 24 hr capsule Take 120 mg by mouth 2 (two) times daily.    . FLUoxetine (PROZAC) 20 MG tablet Take 1 tablet (20 mg total) by mouth daily. 90 tablet 1  . FLUoxetine (PROZAC) 20 MG tablet Take 1 tablet (20 mg total) by mouth daily. 90 tablet 1  . folic acid (FOLVITE) 1 MG tablet Take 1 mg by mouth daily.     . furosemide (LASIX) 40 MG tablet Take 1 tablet (40 mg total) by mouth daily. 90 tablet 1  . glucose blood (ONE TOUCH ULTRA TEST) test strip 1 each by Other route daily. Use as instructed 100 each 1  . hydrochlorothiazide (HYDRODIURIL) 25 MG tablet Take 1 tablet (25 mg total) by mouth daily. 90 tablet 1  . levETIRAcetam (KEPPRA) 500 MG tablet Take 2 tablets (1,000 mg total) by mouth at bedtime. 60 tablet 4  . metFORMIN (GLUCOPHAGE-XR) 500 MG 24 hr tablet Take 1 tablet (500 mg total) by mouth 2 (two) times daily. 180 tablet 1  . methotrexate (RHEUMATREX) 2.5 MG tablet Take 12.5 mg by mouth once a week. Caution:Chemotherapy. Protect from light:  On Mondays    . metoprolol (LOPRESSOR) 50 MG tablet Take 1 tablet (50 mg total) by mouth 2 (two) times daily. 180 tablet 1  . phentermine 37.5 MG capsule Take 1 capsule (37.5 mg total) by   mouth daily. Adipex 30 capsule 0  . potassium chloride SA (K-DUR,KLOR-CON) 20 MEQ tablet Take 20 mEq by mouth 2 (two) times daily.    . saw palmetto 160 MG capsule Take 160 mg by mouth daily.    . sildenafil (VIAGRA) 100 MG tablet Take 0.5-1 tablets (50-100 mg total) by mouth daily as needed for erectile dysfunction. 5 tablet 11  . testosterone cypionate (DEPOTESTOTERONE CYPIONATE) 200 MG/ML injection Inject 200 mg into the muscle every 14 (fourteen) days. Last injection 02/27/14     Current  Facility-Administered Medications  Medication Dose Route Frequency Provider Last Rate Last Dose  . testosterone cypionate (DEPOTESTOTERONE CYPIONATE) injection 200 mg  200 mg Intramuscular Q14 Days Newt Lukes, MD   200 mg at 01/16/14 1116    Past Medical History  Diagnosis Date  . PULMONARY SARCOIDOSIS     Mediastinal lymphadenopathy with biospy proven sarcodosis  . DIABETES MELLITUS, TYPE II   . TESTICULAR HYPOFUNCTION   . HYPERLIPIDEMIA   . Gout, unspecified   . POLYNEUROPATHY   . HYPERTENSION   . CORONARY ARTERY DISEASE   . GERD   . ERECTILE DYSFUNCTION, ORGANIC   . Edema   . TIA (transient ischemic attack)   . Arthritis     ? 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 Followed by Dr. Nickola Major  . Narcolepsy without cataplexy     MSLT 01/09/09 & MRI brain 01/09/09  . Pericarditis     recurrent    Past Surgical History  Procedure Laterality Date  . Bronchoscopy  08/21/08  . Mediastinoscopy  11/30/08  . Cardiac catheterization  06/25/2009    minimal disease    Family History  Problem Relation Age of Onset  . Diabetes Mother   . Diabetes Father   . Heart disease Father   . Hypertension Mother   . Heart failure Father     History   Social History  . Marital Status: Married    Spouse Name: N/A    Number of Children: N/A  . Years of Education: N/A   Occupational History  . Not on file.   Social History Main Topics  . Smoking status: Former Smoker -- 15 years    Types: Cigarettes    Quit date: 07/09/2008  . Smokeless tobacco: Not on file     Comment: Married, lives with wife and 3 kids. Currently student. Prev worked as a Research officer, trade union, Engineer, materials at [employer]  . Alcohol Use: Yes     Comment: occ  . Drug Use: No  . Sexual Activity: Not on file   Other Topics Concern  . Not on file   Social History Narrative    Review of systems: The patient specifically denies any chest pain at rest or with exertion, dyspnea at rest or with exertion, orthopnea,  paroxysmal nocturnal dyspnea, syncope, palpitations, focal neurological deficits, intermittent claudication, lower extremity edema, unexplained weight gain, cough, hemoptysis or wheezing.  The patient also denies abdominal pain, nausea, vomiting, dysphagia, diarrhea, constipation, polyuria, polydipsia, dysuria, hematuria, frequency, urgency, abnormal bleeding or bruising, fever, chills, unexpected weight changes, mood swings, change in skin or hair texture, change in voice quality, auditory or visual problems, allergic reactions or rashes, new musculoskeletal complaints other than usual "aches and pains".   PHYSICAL EXAM BP 124/80 mmHg  Pulse 86  Ht 6\' 3"  (1.905 m)  Wt 253 lb (114.76 kg)  BMI 31.62 kg/m2  SpO2 98%  General: Alert, oriented x3, no distress Head: no evidence of trauma, PERRL, EOMI, no  exophtalmos or lid lag, no myxedema, no xanthelasma; normal ears, nose and oropharynx Neck: normal jugular venous pulsations and no hepatojugular reflux; brisk carotid pulses without delay and no carotid bruits Chest: clear to auscultation, no signs of consolidation by percussion or palpation, normal fremitus, symmetrical and full respiratory excursions Cardiovascular: normal position and quality of the apical impulse, regular rhythm, normal first and second heart sounds, no murmurs, rubs or gallops Abdomen: no tenderness or distention, no masses by palpation, no abnormal pulsatility or arterial bruits, normal bowel sounds, no hepatosplenomegaly Extremities: no clubbing, cyanosis or edema; 2+ radial, ulnar and brachial pulses bilaterally; 2+ right femoral, posterior tibial and dorsalis pedis pulses; 2+ left femoral, posterior tibial and dorsalis pedis pulses; no subclavian or femoral bruits Neurological: grossly nonfocal   EKG: Sinus rhythm with prominent changes of early repolarization unchanged from previous tracings  Lipid Panel     Component Value Date/Time   CHOL 189 11/22/2013 1608    TRIG 315.0* 11/22/2013 1608   HDL 41.50 11/22/2013 1608   CHOLHDL 5 11/22/2013 1608   VLDL 63.0* 11/22/2013 1608   LDLCALC 85 11/22/2013 1608    BMET    Component Value Date/Time   NA 139 03/04/2014 1304   K 4.2 03/04/2014 1304   CL 99 03/04/2014 1304   CO2 27 03/04/2014 1304   GLUCOSE 165* 03/04/2014 1304   BUN 7 03/04/2014 1304   CREATININE 0.71 03/04/2014 1304   CALCIUM 9.4 03/04/2014 1304   GFRNONAA >90 03/04/2014 1304   GFRAA >90 03/04/2014 1304     [facility] cardiac cath 2005 Dr Algie Coffer CORONARY ANATOMY: The left main coronary artery was short and unremarkable.  Left anterior descending coronary artery: The left anterior descending coronary artery was essentially unremarkable except for luminal irregularities in the proximal one-third of the vessel. It wrapped around the apex of the heart. Its diagonal one, two and three vessels were unremarkable.  Left circumflex coronary artery: The left circumflex coronary artery was essentially unremarkable with a very small ramus branch and normal obtuse marginal branch one and two.  Right coronary artery: The right coronary artery was dominant and had longer marginal branch running parallel to the main right coronary artery and supplying the posterior lateral area. The posterior descending artery from right coronary artery was unremarkable.  LEFT VENTRICULOGRAM: Left ventriculogram showed hypodynamic wall motion with ejection fraction of 70-75%.  IMPRESSION: 1. Near normal coronaries. 2. Hypertensive heart disease.  RECOMMENDATIONS: This patient will be treated medically for now and may undergo noncardiac chest pain evaluation.  EKG: 03/08/2014 - early repolarization in inferolateral leads, unchanged from EKG 2013.     ASSESSMENT AND PLAN  1. Typical angina - known nonobstructive CAD 10 years ago and multiple risk factors that include diabetes mellitus, hypertension, untreated  hyperlipidemia, significant family history of fracture coronary artery disease and obesity. We'll schedule left cardiac catheterization. Based on the results we'll start a different statin since he didn't tolerate atorvastatin. We'll start aspirin 81 mg daily, continue metoprolol. He is also given prescription for when necessary sublingual nitroglycerin and advised never to take it within a 24-hour period after taking sildenafil. His LVEF was preserved on echo and cardiac catheterization in the past.  2. Hypertension - currently controlled we'll continue the same regimen.  3. Hyperlipidemia - significant triglycerides elevations 315 and LDL of 85, his goal should be under 70 considering his diabetic. He will most probably need a combination of fibrate and statin.  4. Biopsy proven lung sarcoidosis - no  evidence of cardiac sarcoidosis, no syncope  Follow up after the cath.

## 2014-05-15 ENCOUNTER — Telehealth: Payer: Self-pay | Admitting: *Deleted

## 2014-05-15 MED ORDER — PHENTERMINE HCL 37.5 MG PO CAPS: 37.5000 mg | ORAL_CAPSULE | Freq: Every day | ORAL | Status: DC

## 2014-05-15 NOTE — Telephone Encounter (Signed)
Notified pt rx ready for pick-up.../lmb 

## 2014-05-15 NOTE — Telephone Encounter (Signed)
Printed and signed.  

## 2014-05-15 NOTE — Telephone Encounter (Signed)
Left msg on triage requesting refill on phentermine...Raechel Chute

## 2014-05-18 ENCOUNTER — Encounter (HOSPITAL_COMMUNITY)
Admission: RE | Disposition: A | Discharge status: Home / Self Care | Payer: Self-pay | Source: Ambulatory Visit | Attending: Interventional Cardiology

## 2014-05-18 ENCOUNTER — Encounter (HOSPITAL_COMMUNITY): Payer: Self-pay | Admitting: Interventional Cardiology

## 2014-05-18 ENCOUNTER — Ambulatory Visit (HOSPITAL_COMMUNITY)
Admission: RE | Admit: 2014-05-18 | Discharge: 2014-05-18 | Disposition: A | Discharge status: Home / Self Care | Payer: Medicare Other | Source: Ambulatory Visit | Attending: Interventional Cardiology | Admitting: Interventional Cardiology

## 2014-05-18 DIAGNOSIS — R079 Chest pain, unspecified: Secondary | ICD-10-CM

## 2014-05-18 DIAGNOSIS — I119 Hypertensive heart disease without heart failure: Secondary | ICD-10-CM | POA: Insufficient documentation

## 2014-05-18 DIAGNOSIS — Z6832 Body mass index (BMI) 32.0-32.9, adult: Secondary | ICD-10-CM | POA: Insufficient documentation

## 2014-05-18 DIAGNOSIS — I2510 Atherosclerotic heart disease of native coronary artery without angina pectoris: Secondary | ICD-10-CM | POA: Insufficient documentation

## 2014-05-18 DIAGNOSIS — E669 Obesity, unspecified: Secondary | ICD-10-CM | POA: Diagnosis not present

## 2014-05-18 DIAGNOSIS — M1990 Unspecified osteoarthritis, unspecified site: Secondary | ICD-10-CM | POA: Diagnosis not present

## 2014-05-18 DIAGNOSIS — K219 Gastro-esophageal reflux disease without esophagitis: Secondary | ICD-10-CM | POA: Insufficient documentation

## 2014-05-18 DIAGNOSIS — E785 Hyperlipidemia, unspecified: Secondary | ICD-10-CM | POA: Diagnosis not present

## 2014-05-18 DIAGNOSIS — Z8261 Family history of arthritis: Secondary | ICD-10-CM | POA: Diagnosis not present

## 2014-05-18 DIAGNOSIS — Z87891 Personal history of nicotine dependence: Secondary | ICD-10-CM | POA: Diagnosis not present

## 2014-05-18 DIAGNOSIS — E119 Type 2 diabetes mellitus without complications: Secondary | ICD-10-CM | POA: Insufficient documentation

## 2014-05-18 DIAGNOSIS — M109 Gout, unspecified: Secondary | ICD-10-CM | POA: Insufficient documentation

## 2014-05-18 DIAGNOSIS — Z833 Family history of diabetes mellitus: Secondary | ICD-10-CM | POA: Diagnosis not present

## 2014-05-18 DIAGNOSIS — Z79899 Other long term (current) drug therapy: Secondary | ICD-10-CM | POA: Diagnosis not present

## 2014-05-18 DIAGNOSIS — I440 Atrioventricular block, first degree: Secondary | ICD-10-CM | POA: Insufficient documentation

## 2014-05-18 DIAGNOSIS — Z8249 Family history of ischemic heart disease and other diseases of the circulatory system: Secondary | ICD-10-CM | POA: Insufficient documentation

## 2014-05-18 DIAGNOSIS — I209 Angina pectoris, unspecified: Secondary | ICD-10-CM

## 2014-05-18 HISTORY — PX: LEFT HEART CATHETERIZATION WITH CORONARY ANGIOGRAM: SHX5451

## 2014-05-18 LAB — GLUCOSE, CAPILLARY
Glucose-Capillary: 143 mg/dL — ABNORMAL HIGH (ref 70–99)
Glucose-Capillary: 140 mg/dL — ABNORMAL HIGH (ref 70–99)

## 2014-05-18 SURGERY — LEFT HEART CATHETERIZATION WITH CORONARY ANGIOGRAM
Anesthesia: LOCAL

## 2014-05-18 MED ORDER — ASPIRIN 81 MG PO CHEW
CHEWABLE_TABLET | ORAL | Status: AC
  Filled 2014-05-18: qty 1

## 2014-05-18 MED ORDER — NITROGLYCERIN 1 MG/10 ML FOR IR/CATH LAB
INTRA_ARTERIAL | Status: AC
  Filled 2014-05-18: qty 10

## 2014-05-18 MED ORDER — HEPARIN (PORCINE) IN NACL 2-0.9 UNIT/ML-% IJ SOLN
INTRAMUSCULAR | Status: AC
  Filled 2014-05-18: qty 1000

## 2014-05-18 MED ORDER — SODIUM CHLORIDE 0.9 % IV SOLN
INTRAVENOUS | Status: DC
  Administered 2014-05-18: 1000 mL via INTRAVENOUS

## 2014-05-18 MED ORDER — FENTANYL CITRATE 0.05 MG/ML IJ SOLN
INTRAMUSCULAR | Status: AC
  Filled 2014-05-18: qty 2

## 2014-05-18 MED ORDER — ASPIRIN 81 MG PO CHEW
81.0000 mg | CHEWABLE_TABLET | ORAL | Status: AC
  Administered 2014-05-18: 81 mg via ORAL

## 2014-05-18 MED ORDER — SODIUM CHLORIDE 0.9 % IV SOLN: 250.0000 mL | INTRAVENOUS | Status: DC | PRN

## 2014-05-18 MED ORDER — SODIUM CHLORIDE 0.9 % IV SOLN: INTRAVENOUS | Status: AC

## 2014-05-18 MED ORDER — MIDAZOLAM HCL 2 MG/2ML IJ SOLN
INTRAMUSCULAR | Status: AC
  Filled 2014-05-18: qty 2

## 2014-05-18 MED ORDER — METFORMIN HCL ER 500 MG PO TB24: 500.0000 mg | ORAL_TABLET | Freq: Two times a day (BID) | ORAL | Status: DC

## 2014-05-18 MED ORDER — LIDOCAINE HCL (PF) 1 % IJ SOLN
INTRAMUSCULAR | Status: AC
  Filled 2014-05-18: qty 30

## 2014-05-18 MED ORDER — SODIUM CHLORIDE 0.9 % IJ SOLN: 3.0000 mL | INTRAMUSCULAR | Status: DC | PRN

## 2014-05-18 MED ORDER — SODIUM CHLORIDE 0.9 % IJ SOLN: 3.0000 mL | Freq: Two times a day (BID) | INTRAMUSCULAR | Status: DC

## 2014-05-18 NOTE — Interval H&P Note (Signed)
Cath Lab Visit (complete for each Cath Lab visit)  Clinical Evaluation Leading to the Procedure:   ACS: No.  Non-ACS:    Anginal Classification: CCS III  Anti-ischemic medical therapy: Maximal Therapy (2 or more classes of medications)  Non-Invasive Test Results: No non-invasive testing performed  Prior CABG: No previous CABG   Ischemic Symptoms? CCS III (Marked limitation of ordinary activity) Anti-ischemic Medical Therapy? Maximal Medical Therapy (2 or more classes of medications) Non-invasive Test Results? No non-invasive testing performed Prior CABG? No Previous CABG   Patient Information:   1-2V CAD, no prox LAD  A (7)  Indication: 20; Score: 7   Patient Information:   1-2V-CAD with DS 50-60% With No FFR, No IVUS  I (3)  Indication: 21; Score: 3   Patient Information:   1-2V-CAD with DS 50-60% With FFR  A (7)  Indication: 22; Score: 7   Patient Information:   1-2V-CAD with DS 50-60% With FFR>0.8, IVUS not significant  I (2)  Indication: 23; Score: 2   Patient Information:   3V-CAD without LMCA With Abnormal LV systolic function  A (9)  Indication: 48; Score: 9   Patient Information:   LMCA-CAD  A (9)  Indication: 49; Score: 9   Patient Information:   2V-CAD with prox LAD PCI  A (7)  Indication: 62; Score: 7   Patient Information:   2V-CAD with prox LAD CABG  A (8)  Indication: 62; Score: 8   Patient Information:   3V-CAD without LMCA With Low CAD burden(i.e., 3 focal stenoses, low SYNTAX score) PCI  A (7)  Indication: 63; Score: 7   Patient Information:   3V-CAD without LMCA With Low CAD burden(i.e., 3 focal stenoses, low SYNTAX score) CABG  A (9)  Indication: 63; Score: 9   Patient Information:   3V-CAD without LMCA E06c - Intermediate-high CAD burden (i.e., multiple diffuse lesions, presence of CTO, or high SYNTAX score) PCI  U (4)  Indication: 64; Score: 4   Patient Information:   3V-CAD without  LMCA E06c - Intermediate-high CAD burden (i.e., multiple diffuse lesions, presence of CTO, or high SYNTAX score) CABG  A (9)  Indication: 64; Score: 9   Patient Information:   LMCA-CAD With Isolated LMCA stenosis  PCI  U (6)  Indication: 65; Score: 6   Patient Information:   LMCA-CAD With Isolated LMCA stenosis  CABG  A (9)  Indication: 65; Score: 9   Patient Information:   LMCA-CAD Additional CAD, low CAD burden (i.e., 1- to 2-vessel additional involvement, low SYNTAX score) PCI  U (5)  Indication: 66; Score: 5   Patient Information:   LMCA-CAD Additional CAD, low CAD burden (i.e., 1- to 2-vessel additional involvement, low SYNTAX score) CABG  A (9)  Indication: 66; Score: 9   Patient Information:   LMCA-CAD Additional CAD, intermediate-high CAD burden (i.e., 3-vessel involvement, presence of CTO, or high SYNTAX score) PCI  I (3)  Indication: 67; Score: 3   Patient Information:   LMCA-CAD Additional CAD, intermediate-high CAD burden (i.e., 3-vessel involvement, presence of CTO, or high SYNTAX score) CABG  A (9)  Indication: 67; Score: 9    History and Physical Interval Note:  05/18/2014 7:48 AM  Mikle Bosworth T Wyly  has presented today for surgery, with the diagnosis of cp  The various methods of treatment have been discussed with the patient and family. After consideration of risks, benefits and other options for treatment, the patient has consented to  Procedure(s): LEFT HEART CATHETERIZATION  WITH CORONARY ANGIOGRAM (N/A) as a surgical intervention .  The patient's history has been reviewed, patient examined, no change in status, stable for surgery.  I have reviewed the patient's chart and labs.  Questions were answered to the patient's satisfaction.     VARANASI,JAYADEEP S.

## 2014-05-18 NOTE — H&P (View-Only) (Signed)
Patient ID: [name], male   DOB: [birth year], 43 y.o.   MRN: [id number]      Reason for office visit Chest pain, known CAD  [name] is a 43 year old gentleman with prior medical history of pulmonary sarcoidosis, diabetes, obesity, hypertension, hyperlipidemia, significant family history of premature coronary artery disease and rheumatoid arthritis, who is coming for follow-up visit for chest pain. Patient underwent cardiac catheterization in 2005 that showed nonobstructive CAD.  [name] has been having different medical problems recently on and off on prednisone for pulmonary sarcoidosis currently only on methotrexate. [name] has significant mixed hyperlipidemia however [name] was unable to tolerate atorvastatin as it gave him significant muscle pain. [name] has been experiencing typical chest pain on exertion. The first episode was 2 weeks ago when [name] was caring heavy object at work developed significant chest pain associated shortness of breath, diaphoresis and nausea that resolved within 45 minutes. [name] didn't seek medical care and ran out of sublingual nitroglycerin. Since then [name] has been experiencing chest pain with minimal pain that resolved with rest. [name] also feels palpitations are occasionally and no syncope. [name] denies any orthopnea or proximal nocturnal dyspnea.  His family history is very positive for premature coronary artery disease, his father died of myocardial infarction at age of 58. Multiple uncles and aunts on his father's side died in her 18s secondary to myocardial infarction.  Allergies  Allergen Reactions  . Sulfonamide Derivatives Rash and Other (See Comments)    Current Outpatient Prescriptions  Medication Sig Dispense Refill  . albuterol (PROVENTIL HFA;VENTOLIN HFA) 108 (90 BASE) MCG/ACT inhaler Inhale 2 puffs into the lungs every 6 (six) hours as needed for wheezing or shortness of breath. 1 Inhaler 0  . allopurinol (ZYLOPRIM) 300 MG tablet Take 300 mg by mouth daily.    Marland Kitchen  amitriptyline (ELAVIL) 10 MG tablet Take 10 mg by mouth at bedtime.    Marland Kitchen atorvastatin (LIPITOR) 10 MG tablet Take 1 tablet (10 mg total) by mouth daily. 90 tablet 3  . colchicine (COLCRYS) 0.6 MG tablet Take 0.6 mg by mouth daily.    Marland Kitchen diltiazem (DILTIAZEM CD) 120 MG 24 hr capsule Take 120 mg by mouth 2 (two) times daily.    Marland Kitchen FLUoxetine (PROZAC) 20 MG tablet Take 1 tablet (20 mg total) by mouth daily. 90 tablet 1  . FLUoxetine (PROZAC) 20 MG tablet Take 1 tablet (20 mg total) by mouth daily. 90 tablet 1  . folic acid (FOLVITE) 1 MG tablet Take 1 mg by mouth daily.     . furosemide (LASIX) 40 MG tablet Take 1 tablet (40 mg total) by mouth daily. 90 tablet 1  . glucose blood (ONE TOUCH ULTRA TEST) test strip 1 each by Other route daily. Use as instructed 100 each 1  . hydrochlorothiazide (HYDRODIURIL) 25 MG tablet Take 1 tablet (25 mg total) by mouth daily. 90 tablet 1  . levETIRAcetam (KEPPRA) 500 MG tablet Take 2 tablets (1,000 mg total) by mouth at bedtime. 60 tablet 4  . metFORMIN (GLUCOPHAGE-XR) 500 MG 24 hr tablet Take 1 tablet (500 mg total) by mouth 2 (two) times daily. 180 tablet 1  . methotrexate (RHEUMATREX) 2.5 MG tablet Take 12.5 mg by mouth once a week. Caution:Chemotherapy. Protect from light:  On Mondays    . metoprolol (LOPRESSOR) 50 MG tablet Take 1 tablet (50 mg total) by mouth 2 (two) times daily. 180 tablet 1  . phentermine 37.5 MG capsule Take 1 capsule (37.5 mg total) by  mouth daily. Adipex 30 capsule 0  . potassium chloride SA (K-DUR,KLOR-CON) 20 MEQ tablet Take 20 mEq by mouth 2 (two) times daily.    . saw palmetto 160 MG capsule Take 160 mg by mouth daily.    . sildenafil (VIAGRA) 100 MG tablet Take 0.5-1 tablets (50-100 mg total) by mouth daily as needed for erectile dysfunction. 5 tablet 11  . testosterone cypionate (DEPOTESTOTERONE CYPIONATE) 200 MG/ML injection Inject 200 mg into the muscle every 14 (fourteen) days. Last injection 02/27/14     Current  Facility-Administered Medications  Medication Dose Route Frequency Provider Last Rate Last Dose  . testosterone cypionate (DEPOTESTOTERONE CYPIONATE) injection 200 mg  200 mg Intramuscular Q14 Days Newt Lukes, MD   200 mg at 01/16/14 1116    Past Medical History  Diagnosis Date  . PULMONARY SARCOIDOSIS     Mediastinal lymphadenopathy with biospy proven sarcodosis  . DIABETES MELLITUS, TYPE II   . TESTICULAR HYPOFUNCTION   . HYPERLIPIDEMIA   . Gout, unspecified   . POLYNEUROPATHY   . HYPERTENSION   . CORONARY ARTERY DISEASE   . GERD   . ERECTILE DYSFUNCTION, ORGANIC   . Edema   . TIA (transient ischemic attack)   . Arthritis     ? 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 Followed by Dr. Nickola Major  . Narcolepsy without cataplexy     MSLT 01/09/09 & MRI brain 01/09/09  . Pericarditis     recurrent    Past Surgical History  Procedure Laterality Date  . Bronchoscopy  08/21/08  . Mediastinoscopy  11/30/08  . Cardiac catheterization  06/25/2009    minimal disease    Family History  Problem Relation Age of Onset  . Diabetes Mother   . Diabetes Father   . Heart disease Father   . Hypertension Mother   . Heart failure Father     History   Social History  . Marital Status: Married    Spouse Name: N/A    Number of Children: N/A  . Years of Education: N/A   Occupational History  . Not on file.   Social History Main Topics  . Smoking status: Former Smoker -- 15 years    Types: Cigarettes    Quit date: 07/09/2008  . Smokeless tobacco: Not on file     Comment: Married, lives with wife and 3 kids. Currently student. Prev worked as a Research officer, trade union, Engineer, materials at [employer]  . Alcohol Use: Yes     Comment: occ  . Drug Use: No  . Sexual Activity: Not on file   Other Topics Concern  . Not on file   Social History Narrative    Review of systems: The patient specifically denies any chest pain at rest or with exertion, dyspnea at rest or with exertion, orthopnea,  paroxysmal nocturnal dyspnea, syncope, palpitations, focal neurological deficits, intermittent claudication, lower extremity edema, unexplained weight gain, cough, hemoptysis or wheezing.  The patient also denies abdominal pain, nausea, vomiting, dysphagia, diarrhea, constipation, polyuria, polydipsia, dysuria, hematuria, frequency, urgency, abnormal bleeding or bruising, fever, chills, unexpected weight changes, mood swings, change in skin or hair texture, change in voice quality, auditory or visual problems, allergic reactions or rashes, new musculoskeletal complaints other than usual "aches and pains".   PHYSICAL EXAM BP 124/80 mmHg  Pulse 86  Ht 6\' 3"  (1.905 m)  Wt 253 lb (114.76 kg)  BMI 31.62 kg/m2  SpO2 98%  General: Alert, oriented x3, no distress Head: no evidence of trauma, PERRL, EOMI, no  exophtalmos or lid lag, no myxedema, no xanthelasma; normal ears, nose and oropharynx Neck: normal jugular venous pulsations and no hepatojugular reflux; brisk carotid pulses without delay and no carotid bruits Chest: clear to auscultation, no signs of consolidation by percussion or palpation, normal fremitus, symmetrical and full respiratory excursions Cardiovascular: normal position and quality of the apical impulse, regular rhythm, normal first and second heart sounds, no murmurs, rubs or gallops Abdomen: no tenderness or distention, no masses by palpation, no abnormal pulsatility or arterial bruits, normal bowel sounds, no hepatosplenomegaly Extremities: no clubbing, cyanosis or edema; 2+ radial, ulnar and brachial pulses bilaterally; 2+ right femoral, posterior tibial and dorsalis pedis pulses; 2+ left femoral, posterior tibial and dorsalis pedis pulses; no subclavian or femoral bruits Neurological: grossly nonfocal   EKG: Sinus rhythm with prominent changes of early repolarization unchanged from previous tracings  Lipid Panel     Component Value Date/Time   CHOL 189 11/22/2013 1608    TRIG 315.0* 11/22/2013 1608   HDL 41.50 11/22/2013 1608   CHOLHDL 5 11/22/2013 1608   VLDL 63.0* 11/22/2013 1608   LDLCALC 85 11/22/2013 1608    BMET    Component Value Date/Time   NA 139 03/04/2014 1304   K 4.2 03/04/2014 1304   CL 99 03/04/2014 1304   CO2 27 03/04/2014 1304   GLUCOSE 165* 03/04/2014 1304   BUN 7 03/04/2014 1304   CREATININE 0.71 03/04/2014 1304   CALCIUM 9.4 03/04/2014 1304   GFRNONAA >90 03/04/2014 1304   GFRAA >90 03/04/2014 1304     Lelf cardiac cath 2005 Dr Algie Coffer CORONARY ANATOMY: The left main coronary artery was short and unremarkable.  Left anterior descending coronary artery: The left anterior descending coronary artery was essentially unremarkable except for luminal irregularities in the proximal one-third of the vessel. It wrapped around the apex of the heart. Its diagonal one, two and three vessels were unremarkable.  Left circumflex coronary artery: The left circumflex coronary artery was essentially unremarkable with a very small ramus branch and normal obtuse marginal branch one and two.  Right coronary artery: The right coronary artery was dominant and had longer marginal branch running parallel to the main right coronary artery and supplying the posterior lateral area. The posterior descending artery from right coronary artery was unremarkable.  LEFT VENTRICULOGRAM: Left ventriculogram showed hypodynamic wall motion with ejection fraction of 70-75%.  IMPRESSION: 1. Near normal coronaries. 2. Hypertensive heart disease.  RECOMMENDATIONS: This patient will be treated medically for now and may undergo noncardiac chest pain evaluation.  EKG: 03/08/2014 - early repolarization in inferolateral leads, unchanged from EKG 2013.     ASSESSMENT AND PLAN  1. Typical angina - known nonobstructive CAD 10 years ago and multiple risk factors that include diabetes mellitus, hypertension, untreated  hyperlipidemia, significant family history of fracture coronary artery disease and obesity. We'll schedule left cardiac catheterization. Based on the results we'll start a different statin since [name] didn't tolerate atorvastatin. We'll start aspirin 81 mg daily, continue metoprolol. [name] is also given prescription for when necessary sublingual nitroglycerin and advised never to take it within a 24-hour period after taking sildenafil. His LVEF was preserved on echo and cardiac catheterization in the past.  2. Hypertension - currently controlled we'll continue the same regimen.  3. Hyperlipidemia - significant triglycerides elevations 315 and LDL of 85, his goal should be under 70 considering his diabetic. [name] will most probably need a combination of fibrate and statin.  4. Biopsy proven lung sarcoidosis - no  evidence of cardiac sarcoidosis, no syncope  Follow up after the cath.

## 2014-05-18 NOTE — Discharge Instructions (Signed)
No lifting more than 10 lbs for a week.  Follow post cath instructions.  Angiogram, Care After Refer to this sheet in the next few weeks. These instructions provide you with information on caring for yourself after your procedure. Your health care provider may also give you more specific instructions. Your treatment has been planned according to current medical practices, but problems sometimes occur. Call your health care provider if you have any problems or questions after your procedure.  WHAT TO EXPECT AFTER THE PROCEDURE After your procedure, it is typical to have the following sensations:  Minor discomfort or tenderness and a small bump at the catheter insertion site. The bump should usually decrease in size and tenderness within 1 to 2 weeks.  Any bruising will usually fade within 2 to 4 weeks. HOME CARE INSTRUCTIONS   You may need to keep taking blood thinners if they were prescribed for you. Take medicines only as directed by your health care provider.  Do not apply powder or lotion to the site.  Do not take baths, swim, or use a hot tub until your health care provider approves.  You may shower 24 hours after the procedure. Remove the bandage (dressing) and gently wash the site with plain soap and water. Gently pat the site dry.  Inspect the site at least twice daily.  Limit your activity for the first 48 hours. Do not bend, squat, or lift anything over 20 lb (9 kg) or as directed by your health care provider.  Plan to have someone take you home after the procedure. Follow instructions about when you can drive or return to work. SEEK MEDICAL CARE IF:  You get light-headed when standing up.  You have drainage (other than a small amount of blood on the dressing).  You have chills.  You have a fever.  You have redness, warmth, swelling, or pain at the insertion site. SEEK IMMEDIATE MEDICAL CARE IF:   You develop chest pain or shortness of breath, feel faint, or pass  out.  You have bleeding, swelling larger than a walnut, or drainage from the catheter insertion site.  You develop pain, discoloration, coldness, or severe bruising in the leg or arm that held the catheter.  You have heavy bleeding from the site. If this happens, hold pressure on the site and call 911. MAKE SURE YOU:  Understand these instructions.  Will watch your condition.  Will get help right away if you are not doing well or get worse. Document Released: 12/11/2004 Document Revised: 10/09/2013 Document Reviewed: 10/17/2012 [facility] Patient Information 2015 [place], [place]. This information is not intended to replace advice given to you by your health care provider. Make sure you discuss any questions you have with your health care provider.

## 2014-05-18 NOTE — Progress Notes (Signed)
Site area: Right groin a 5 french sheath was removed  Site Prior to Removal:  Level 0  Pressure Applied For 20 MINUTES    Minutes Beginning at 0830am  Manual:   Yes.    Patient Status During Pull:  stsable  Post Pull Groin Site:  Level 0  Post Pull Instructions Given:  Yes.    Post Pull Pulses Present:  Yes.    Dressing Applied:  Yes.    Comments:  Pt VS remain stable during sheath pull.  Pt denies any discomfort at this time.

## 2014-05-18 NOTE — CV Procedure (Signed)
    PROCEDURE:  Left heart catheterization with selective coronary angiography, left ventriculogram.  INDICATIONS:  Angina  The risks, benefits, and details of the procedure were explained to the patient.  The patient verbalized understanding and wanted to proceed.  Informed written consent was obtained.  The patient was offered the radial option but declined. He preferred a femoral approach.  PROCEDURE TECHNIQUE:  After Xylocaine anesthesia a 57F sheath was placed in the right femoral artery with a single anterior needle wall stick.   Left coronary angiography was done using a Judkins L4 guide catheter.  Right coronary angiography was done using a Judkins R4 guide catheter.  Left ventriculography was done using a pigtail catheter.  Manual compression was used for hemostasis.   CONTRAST:  Total of 60 cc.  COMPLICATIONS:  None.    HEMODYNAMICS:  Aortic pressure was 105/70; LV pressure was 105/2; LVEDP 16.  There was no gradient between the left ventricle and aorta.    ANGIOGRAPHIC DATA:   The left main coronary artery is widely patent.  The left anterior descending artery is a large vessel which wraps around the apex. There is mild atherosclerosis in the mid vessel. There are 2 large diagonals more proximally. There are widely patent. The third diagonal is medium-sized and patent.  The left circumflex artery is a large vessel. The first obtuse marginal is large and branches across the lateral wall.  The second obtuse marginal is widely patent.  The right coronary artery is a large dominant vessel. There is an early bifurcation of the posterior lateral and posterior descending arteries. Both vessels are widely patent.  LEFT VENTRICULOGRAM:  Left ventricular angiogram was done in the 30 RAO projection and revealed normal left ventricular wall motion and systolic function with an estimated ejection fraction of 60 %.  LVEDP was 16 mmHg.  IMPRESSIONS:  1. Normal left main coronary  artery. 2. Mild disease in the left anterior descending artery and its branches. 3. Widely patent left circumflex artery and its branches. 4. Widely patent right coronary artery. 5. Normal left ventricular systolic function.  LVEDP 16 mmHg.  Ejection fraction 60%.  RECOMMENDATION:  Continue preventative therapy. He needs aggressive risk factor modification. Follow-up with Dr. Delton See.

## 2014-05-25 ENCOUNTER — Ambulatory Visit: Payer: Medicare Other | Admitting: Internal Medicine

## 2014-05-30 ENCOUNTER — Other Ambulatory Visit: Payer: Self-pay | Admitting: *Deleted

## 2014-05-30 DIAGNOSIS — E291 Testicular hypofunction: Secondary | ICD-10-CM

## 2014-06-05 ENCOUNTER — Telehealth: Payer: Self-pay

## 2014-06-05 NOTE — Telephone Encounter (Signed)
LVM for pt to call back.   RE: labs orders (testosterone levels) and needed before PA can be initiated.

## 2014-06-13 ENCOUNTER — Other Ambulatory Visit (INDEPENDENT_AMBULATORY_CARE_PROVIDER_SITE_OTHER): Payer: Self-pay

## 2014-06-13 DIAGNOSIS — E291 Testicular hypofunction: Secondary | ICD-10-CM

## 2014-06-13 LAB — TESTOSTERONE: Testosterone: 419.83 ng/dL (ref 300.00–890.00)

## 2014-06-15 ENCOUNTER — Telehealth: Payer: Self-pay | Admitting: Internal Medicine

## 2014-06-15 NOTE — Telephone Encounter (Signed)
Patient is requesting refill script on phentermine

## 2014-06-18 ENCOUNTER — Other Ambulatory Visit: Payer: Self-pay

## 2014-06-18 MED ORDER — PHENTERMINE HCL 37.5 MG PO CAPS: 37.5000 mg | ORAL_CAPSULE | Freq: Every day | ORAL | Status: DC

## 2014-06-18 NOTE — Telephone Encounter (Signed)
rx is printed and will be given at Nurse visit (weight check) pt PCP  Can you call the pt?

## 2014-06-18 NOTE — Telephone Encounter (Signed)
Left patient vm to make a nurse visit for a weight check in order to pick up script.

## 2014-06-22 NOTE — Telephone Encounter (Signed)
Pt called back. Pt informed of information below.

## 2014-07-04 ENCOUNTER — Other Ambulatory Visit: Payer: Self-pay | Admitting: Internal Medicine

## 2014-07-06 ENCOUNTER — Encounter: Payer: Self-pay | Admitting: Internal Medicine

## 2014-07-06 LAB — WEIGHT CHECK WT00: Weight: 276

## 2014-07-08 ENCOUNTER — Other Ambulatory Visit: Payer: Self-pay | Admitting: Internal Medicine

## 2014-07-09 NOTE — Telephone Encounter (Signed)
[facility] s[facility].../lmb

## 2014-07-09 NOTE — Telephone Encounter (Signed)
Ok = printed rx and signed

## 2014-07-17 ENCOUNTER — Telehealth: Payer: Self-pay | Admitting: *Deleted

## 2014-07-17 ENCOUNTER — Ambulatory Visit (INDEPENDENT_AMBULATORY_CARE_PROVIDER_SITE_OTHER): Payer: Medicare Other | Admitting: *Deleted

## 2014-07-17 ENCOUNTER — Ambulatory Visit: Payer: Medicare Other

## 2014-07-17 VITALS — Wt 269.2 lb

## 2014-07-17 DIAGNOSIS — E291 Testicular hypofunction: Secondary | ICD-10-CM

## 2014-07-17 DIAGNOSIS — E669 Obesity, unspecified: Secondary | ICD-10-CM

## 2014-07-17 MED ORDER — PHENTERMINE HCL 37.5 MG PO CAPS: 37.5000 mg | ORAL_CAPSULE | Freq: Every day | ORAL | Status: DC

## 2014-07-17 MED ORDER — TESTOSTERONE CYPIONATE 200 MG/ML IM SOLN
200.0000 mg | INTRAMUSCULAR | Status: DC
  Administered 2014-07-17 – 2014-08-01 (×2): 200 mg via INTRAMUSCULAR

## 2014-07-17 NOTE — Telephone Encounter (Signed)
Printed and signed - needs weight check when picks up rx thanks

## 2014-07-17 NOTE — Telephone Encounter (Signed)
Called pt no answer LMOM with md response.../lmb 

## 2014-07-17 NOTE — Progress Notes (Signed)
   Subjective:    Patient ID: [name], male    DOB: 06-Mar-1971, 44 y.o.   MRN: [id number]  HPI    Review of Systems     Objective:   Physical Exam        Assessment & Plan:

## 2014-07-17 NOTE — Telephone Encounter (Signed)
Pt is requesting refill on his phentermine...Raechel Chute

## 2014-08-01 ENCOUNTER — Ambulatory Visit (INDEPENDENT_AMBULATORY_CARE_PROVIDER_SITE_OTHER): Payer: Medicare Other

## 2014-08-01 DIAGNOSIS — E349 Endocrine disorder, unspecified: Secondary | ICD-10-CM

## 2014-08-01 DIAGNOSIS — E291 Testicular hypofunction: Secondary | ICD-10-CM

## 2014-08-11 ENCOUNTER — Other Ambulatory Visit: Payer: Self-pay | Admitting: Internal Medicine

## 2014-08-15 ENCOUNTER — Ambulatory Visit: Payer: Self-pay

## 2014-08-17 ENCOUNTER — Telehealth: Payer: Self-pay | Admitting: Internal Medicine

## 2014-08-17 MED ORDER — PHENTERMINE HCL 37.5 MG PO CAPS: 37.5000 mg | ORAL_CAPSULE | Freq: Every day | ORAL | Status: DC

## 2014-08-17 NOTE — Telephone Encounter (Signed)
Patient requesting a refill of phentermine 37.5 MG capsule [[id number]] [facility] [address]

## 2014-08-17 NOTE — Telephone Encounter (Signed)
Pt informed that PCP will not be in the office today. Pt is okay waiting until Monday to pick up.

## 2014-08-24 ENCOUNTER — Telehealth: Payer: Self-pay

## 2014-08-24 NOTE — Telephone Encounter (Signed)
PA for testosterone started via fax to [facility]

## 2014-08-28 NOTE — Telephone Encounter (Signed)
Patient is requesting update on PA

## 2014-08-29 NOTE — Telephone Encounter (Signed)
Check cover my meds no PA was found. Completed PA for testosterone cypionate on cover my meds. Sent to [facility]). Waiting on approval status...[name]

## 2014-08-30 ENCOUNTER — Ambulatory Visit (INDEPENDENT_AMBULATORY_CARE_PROVIDER_SITE_OTHER): Payer: Medicare Other | Admitting: *Deleted

## 2014-08-30 DIAGNOSIS — E291 Testicular hypofunction: Secondary | ICD-10-CM | POA: Diagnosis not present

## 2014-08-30 MED ORDER — TESTOSTERONE CYPIONATE 200 MG/ML IM SOLN
200.0000 mg | INTRAMUSCULAR | Status: DC
  Administered 2014-08-30: 200 mg via INTRAMUSCULAR

## 2014-08-30 NOTE — Telephone Encounter (Signed)
Received fax pt was approved. Pt has been notified by [employer]...Raechel Chute

## 2014-09-04 ENCOUNTER — Other Ambulatory Visit: Payer: Self-pay | Admitting: *Deleted

## 2014-09-04 MED ORDER — TADALAFIL 5 MG PO TABS: 5.0000 mg | ORAL_TABLET | Freq: Every day | ORAL | Status: DC | PRN

## 2014-09-04 NOTE — Telephone Encounter (Signed)
Left msg on triage requesting new rx on his cialis 5 mg wanting med sent to [facility]. Called pt back inform [facility] to [facility] on [address]...Raechel Chute

## 2014-09-25 ENCOUNTER — Ambulatory Visit (INDEPENDENT_AMBULATORY_CARE_PROVIDER_SITE_OTHER): Payer: Medicare Other

## 2014-09-25 VITALS — Wt 269.0 lb

## 2014-09-25 DIAGNOSIS — E291 Testicular hypofunction: Secondary | ICD-10-CM

## 2014-09-25 MED ORDER — TESTOSTERONE CYPIONATE 200 MG/ML IM SOLN
200.0000 mg | INTRAMUSCULAR | Status: DC
  Administered 2014-09-25: 200 mg via INTRAMUSCULAR

## 2014-09-25 MED ORDER — TESTOSTERONE CYPIONATE 200 MG/ML IM SOLN: 200.0000 mg | INTRAMUSCULAR | Status: DC

## 2014-09-25 MED ORDER — PHENTERMINE HCL 37.5 MG PO CAPS
37.5000 mg | ORAL_CAPSULE | Freq: Every day | ORAL | Status: DC
Start: 2014-09-25 — End: 2015-03-25

## 2014-10-10 ENCOUNTER — Ambulatory Visit (INDEPENDENT_AMBULATORY_CARE_PROVIDER_SITE_OTHER): Payer: Medicare Other | Admitting: *Deleted

## 2014-10-10 DIAGNOSIS — E291 Testicular hypofunction: Secondary | ICD-10-CM | POA: Diagnosis not present

## 2014-10-10 MED ORDER — TESTOSTERONE CYPIONATE 200 MG/ML IM SOLN
200.0000 mg | INTRAMUSCULAR | Status: DC
  Administered 2014-10-10: 200 mg via INTRAMUSCULAR

## 2014-10-26 ENCOUNTER — Other Ambulatory Visit: Payer: Self-pay | Admitting: Internal Medicine

## 2014-10-29 ENCOUNTER — Ambulatory Visit (INDEPENDENT_AMBULATORY_CARE_PROVIDER_SITE_OTHER): Payer: Medicare Other | Admitting: *Deleted

## 2014-10-29 DIAGNOSIS — E291 Testicular hypofunction: Secondary | ICD-10-CM | POA: Diagnosis not present

## 2014-10-29 MED ORDER — TESTOSTERONE CYPIONATE 200 MG/ML IM SOLN
200.0000 mg | INTRAMUSCULAR | Status: DC
  Administered 2014-10-29: 200 mg via INTRAMUSCULAR

## 2014-11-13 ENCOUNTER — Ambulatory Visit (INDEPENDENT_AMBULATORY_CARE_PROVIDER_SITE_OTHER): Payer: Medicare Other | Admitting: *Deleted

## 2014-11-13 DIAGNOSIS — E291 Testicular hypofunction: Secondary | ICD-10-CM

## 2014-11-13 MED ORDER — TESTOSTERONE CYPIONATE 200 MG/ML IM SOLN
200.0000 mg | INTRAMUSCULAR | Status: DC
  Administered 2014-11-13: 200 mg via INTRAMUSCULAR

## 2014-11-19 ENCOUNTER — Other Ambulatory Visit: Payer: Self-pay

## 2014-11-19 NOTE — Telephone Encounter (Signed)
OK X 1  Last OV 10/15

## 2014-11-20 MED ORDER — TADALAFIL 5 MG PO TABS: 5.0000 mg | ORAL_TABLET | Freq: Every day | ORAL | Status: DC | PRN

## 2014-11-29 ENCOUNTER — Other Ambulatory Visit: Payer: Self-pay | Admitting: Internal Medicine

## 2014-11-30 ENCOUNTER — Other Ambulatory Visit: Payer: Self-pay | Admitting: Internal Medicine

## 2014-12-05 ENCOUNTER — Ambulatory Visit (INDEPENDENT_AMBULATORY_CARE_PROVIDER_SITE_OTHER): Payer: Medicare Other

## 2014-12-05 DIAGNOSIS — E291 Testicular hypofunction: Secondary | ICD-10-CM

## 2014-12-05 MED ORDER — TESTOSTERONE CYPIONATE 200 MG/ML IM SOLN
200.0000 mg | INTRAMUSCULAR | Status: DC
  Administered 2014-12-05: 200 mg via INTRAMUSCULAR

## 2014-12-18 ENCOUNTER — Ambulatory Visit (INDEPENDENT_AMBULATORY_CARE_PROVIDER_SITE_OTHER): Payer: Medicare Other

## 2014-12-18 DIAGNOSIS — E291 Testicular hypofunction: Secondary | ICD-10-CM | POA: Diagnosis not present

## 2014-12-18 MED ORDER — TESTOSTERONE CYPIONATE 200 MG/ML IM SOLN
200.0000 mg | INTRAMUSCULAR | Status: DC
  Administered 2014-12-18: 200 mg via INTRAMUSCULAR

## 2015-01-01 ENCOUNTER — Ambulatory Visit (INDEPENDENT_AMBULATORY_CARE_PROVIDER_SITE_OTHER): Payer: Medicare Other

## 2015-01-01 DIAGNOSIS — E291 Testicular hypofunction: Secondary | ICD-10-CM

## 2015-01-01 MED ORDER — TESTOSTERONE CYPIONATE 200 MG/ML IM SOLN
200.0000 mg | Freq: Once | INTRAMUSCULAR | Status: AC
  Administered 2015-01-01: 200 mg via INTRAMUSCULAR

## 2015-01-02 ENCOUNTER — Ambulatory Visit: Payer: Self-pay

## 2015-01-11 ENCOUNTER — Ambulatory Visit: Payer: Self-pay | Admitting: Pulmonary Disease

## 2015-01-17 ENCOUNTER — Ambulatory Visit (INDEPENDENT_AMBULATORY_CARE_PROVIDER_SITE_OTHER): Payer: Medicare Other | Admitting: Geriatric Medicine

## 2015-01-17 DIAGNOSIS — E291 Testicular hypofunction: Secondary | ICD-10-CM | POA: Diagnosis not present

## 2015-01-17 MED ORDER — TESTOSTERONE CYPIONATE 200 MG/ML IM SOLN
200.0000 mg | Freq: Once | INTRAMUSCULAR | Status: AC
  Administered 2015-01-17: 200 mg via INTRAMUSCULAR

## 2015-01-29 ENCOUNTER — Ambulatory Visit (INDEPENDENT_AMBULATORY_CARE_PROVIDER_SITE_OTHER): Payer: Medicare Other

## 2015-01-29 ENCOUNTER — Telehealth: Payer: Self-pay

## 2015-01-29 DIAGNOSIS — E291 Testicular hypofunction: Secondary | ICD-10-CM

## 2015-01-29 MED ORDER — TESTOSTERONE CYPIONATE 200 MG/ML IM SOLN
200.0000 mg | Freq: Once | INTRAMUSCULAR | Status: AC
  Administered 2015-01-29: 200 mg via INTRAMUSCULAR

## 2015-01-29 NOTE — Telephone Encounter (Signed)
Pt came in today for testosterone injection and it was given per PCP.  Pt stated that it may be time for another PA when he does his next medication fill.   Is it okay to order testosterone in a few months, or wait, pt has CPE in November (there may or may not be enough in his vial to last to that day)?

## 2015-01-30 NOTE — Telephone Encounter (Signed)
Ok to wait until cpe for PA

## 2015-02-12 ENCOUNTER — Ambulatory Visit: Payer: Self-pay

## 2015-02-14 ENCOUNTER — Ambulatory Visit (INDEPENDENT_AMBULATORY_CARE_PROVIDER_SITE_OTHER): Payer: Medicare Other

## 2015-02-14 ENCOUNTER — Telehealth: Payer: Self-pay

## 2015-02-14 DIAGNOSIS — N529 Male erectile dysfunction, unspecified: Secondary | ICD-10-CM

## 2015-02-14 MED ORDER — TESTOSTERONE CYPIONATE 200 MG/ML IM SOLN
200.0000 mg | Freq: Once | INTRAMUSCULAR | Status: AC
  Administered 2015-02-14: 200 mg via INTRAMUSCULAR

## 2015-02-14 NOTE — Telephone Encounter (Signed)
Patient came in to office today to get testosterone injection---last labs were dec/2015 with hematocrit lab value at 50.4---i advised dr Jonny Ruiz, per dr Jonny Ruiz, ok to give testosterone injection

## 2015-02-25 ENCOUNTER — Other Ambulatory Visit: Payer: Self-pay | Admitting: Interventional Cardiology

## 2015-02-26 NOTE — Telephone Encounter (Signed)
Looks like Dr Newt Lukes at [facility] has been maintaining and filling the pts diabetic medications for years.   Please defer refills of Metformin to her office to further advise on refills of Metformin for this pt, for this is his PCP.

## 2015-02-28 ENCOUNTER — Ambulatory Visit (INDEPENDENT_AMBULATORY_CARE_PROVIDER_SITE_OTHER): Payer: Medicare Other

## 2015-02-28 DIAGNOSIS — E291 Testicular hypofunction: Secondary | ICD-10-CM

## 2015-02-28 MED ORDER — TESTOSTERONE CYPIONATE 200 MG/ML IM SOLN
200.0000 mg | Freq: Once | INTRAMUSCULAR | Status: AC
  Administered 2015-02-28: 200 mg via INTRAMUSCULAR

## 2015-03-03 ENCOUNTER — Other Ambulatory Visit: Payer: Self-pay | Admitting: Interventional Cardiology

## 2015-03-04 ENCOUNTER — Telehealth: Payer: Self-pay | Admitting: *Deleted

## 2015-03-04 MED ORDER — COLCHICINE 0.6 MG PO TABS: 0.6000 mg | ORAL_TABLET | Freq: Every day | ORAL | Status: DC

## 2015-03-04 MED ORDER — ALLOPURINOL 300 MG PO TABS: 300.0000 mg | ORAL_TABLET | Freq: Every day | ORAL | Status: DC

## 2015-03-04 NOTE — Telephone Encounter (Signed)
Receive call pt states he is having a gout flare-up. He is needing refills sent on his allopurinol ^ colchicine. Verified pharmacy [facility].../lmb

## 2015-03-14 ENCOUNTER — Ambulatory Visit (INDEPENDENT_AMBULATORY_CARE_PROVIDER_SITE_OTHER): Payer: Medicare Other

## 2015-03-14 ENCOUNTER — Other Ambulatory Visit: Payer: Self-pay | Admitting: Internal Medicine

## 2015-03-14 ENCOUNTER — Other Ambulatory Visit: Payer: Self-pay

## 2015-03-14 DIAGNOSIS — E291 Testicular hypofunction: Secondary | ICD-10-CM

## 2015-03-14 MED ORDER — TESTOSTERONE CYPIONATE 200 MG/ML IM SOLN
200.0000 mg | Freq: Once | INTRAMUSCULAR | Status: AC
  Administered 2015-03-14: 200 mg via INTRAMUSCULAR

## 2015-03-14 MED ORDER — SILDENAFIL CITRATE 100 MG PO TABS: 50.0000 mg | ORAL_TABLET | Freq: Every day | ORAL | Status: DC | PRN

## 2015-03-14 NOTE — Telephone Encounter (Signed)
30 day supply viagra called to [facility] on [address]---patient advised he needs office visit before any further refills

## 2015-03-14 NOTE — Progress Notes (Signed)
I have reviewed and agree with the plan. 

## 2015-03-18 ENCOUNTER — Other Ambulatory Visit: Payer: Self-pay | Admitting: Internal Medicine

## 2015-03-21 ENCOUNTER — Telehealth: Payer: Self-pay | Admitting: Internal Medicine

## 2015-03-21 NOTE — Telephone Encounter (Signed)
He will have to see his PCP about this

## 2015-03-21 NOTE — Telephone Encounter (Signed)
Pt is needing the Phentermine refill. Has CPX sch to see md 04/23/15. MD is out of the office pls advise...[name]

## 2015-03-21 NOTE — Telephone Encounter (Signed)
Pt requesting refill for his medication for weight loss. He's not sure the name Pharmacy is [facility] on [address]

## 2015-03-22 ENCOUNTER — Other Ambulatory Visit: Payer: Self-pay | Admitting: Internal Medicine

## 2015-03-22 NOTE — Telephone Encounter (Signed)
Notified pt with md response.../lmb 

## 2015-03-25 MED ORDER — PHENTERMINE HCL 37.5 MG PO CAPS: 37.5000 mg | ORAL_CAPSULE | Freq: Every day | ORAL | Status: DC

## 2015-03-25 NOTE — Telephone Encounter (Signed)
rx printed for PCP.  

## 2015-03-25 NOTE — Addendum Note (Signed)
Addended by: Tonye Becket on: 03/25/2015 08:18 AM   Modules accepted: Orders

## 2015-04-03 ENCOUNTER — Ambulatory Visit (INDEPENDENT_AMBULATORY_CARE_PROVIDER_SITE_OTHER): Payer: Medicare Other | Admitting: *Deleted

## 2015-04-03 DIAGNOSIS — E291 Testicular hypofunction: Secondary | ICD-10-CM

## 2015-04-03 DIAGNOSIS — Z23 Encounter for immunization: Secondary | ICD-10-CM

## 2015-04-03 MED ORDER — TESTOSTERONE CYPIONATE 200 MG/ML IM SOLN
200.0000 mg | INTRAMUSCULAR | Status: DC
  Administered 2015-04-03: 200 mg via INTRAMUSCULAR

## 2015-04-08 ENCOUNTER — Encounter: Payer: Self-pay | Admitting: Internal Medicine

## 2015-04-15 ENCOUNTER — Telehealth: Payer: Self-pay | Admitting: *Deleted

## 2015-04-15 MED ORDER — TADALAFIL 5 MG PO TABS: ORAL_TABLET | ORAL | Status: DC

## 2015-04-15 NOTE — Telephone Encounter (Signed)
Left msg on triage stating need refill on his cialis. Called pt back no answer LMOM can send 1 refill until appt must keep 04/29/15 for future refills...Raechel Chute

## 2015-04-18 ENCOUNTER — Ambulatory Visit (INDEPENDENT_AMBULATORY_CARE_PROVIDER_SITE_OTHER): Payer: Medicare Other

## 2015-04-18 DIAGNOSIS — E291 Testicular hypofunction: Secondary | ICD-10-CM | POA: Diagnosis not present

## 2015-04-18 MED ORDER — TESTOSTERONE CYPIONATE 200 MG/ML IM SOLN
200.0000 mg | Freq: Once | INTRAMUSCULAR | Status: AC
  Administered 2015-04-18: 200 mg via INTRAMUSCULAR

## 2015-04-21 ENCOUNTER — Other Ambulatory Visit: Payer: Self-pay | Admitting: Internal Medicine

## 2015-04-23 ENCOUNTER — Telehealth: Payer: Self-pay | Admitting: Pulmonary Disease

## 2015-04-23 NOTE — Telephone Encounter (Signed)
Spoke with pt, c/o worsening nonprod cough, chest tightness, sinus congestion since this weekend.  Denies fever, mucus production.   Pt has taken otc cold medications to help with s/s.  Pt requesting further recs.   [facility] [address]    Pt last seen 01/05/2013, no pending appt.  VS please advise.  Thanks.

## 2015-04-23 NOTE — Telephone Encounter (Signed)
Spoke with pt. Advised him of VS's recommendation. States that he will call his PCP. Nothing further was needed.

## 2015-04-23 NOTE — Telephone Encounter (Signed)
Since he has not been seen in pulmonary office since 2014, he needs to come in for evaluation or he can call his PCP.

## 2015-04-24 ENCOUNTER — Ambulatory Visit (INDEPENDENT_AMBULATORY_CARE_PROVIDER_SITE_OTHER): Payer: Medicare Other | Admitting: Family

## 2015-04-24 ENCOUNTER — Encounter: Payer: Self-pay | Admitting: Family

## 2015-04-24 VITALS — BP 136/86 | HR 89 | Temp 97.7°F | Resp 18 | Ht 75.0 in | Wt 275.0 lb

## 2015-04-24 DIAGNOSIS — J069 Acute upper respiratory infection, unspecified: Secondary | ICD-10-CM

## 2015-04-24 MED ORDER — METHYLPREDNISOLONE ACETATE 80 MG/ML IJ SUSP
80.0000 mg | Freq: Once | INTRAMUSCULAR | Status: AC
  Administered 2015-04-24: 80 mg via INTRAMUSCULAR

## 2015-04-24 MED ORDER — FLUTICASONE FUROATE-VILANTEROL 100-25 MCG/INH IN AEPB: 1.0000 | INHALATION_SPRAY | Freq: Every day | RESPIRATORY_TRACT | Status: DC

## 2015-04-24 MED ORDER — ALBUTEROL SULFATE HFA 108 (90 BASE) MCG/ACT IN AERS: 2.0000 | INHALATION_SPRAY | Freq: Four times a day (QID) | RESPIRATORY_TRACT | Status: DC | PRN

## 2015-04-24 NOTE — Assessment & Plan Note (Signed)
Symptoms and exam consistent with acute upper respiratory infection given recent improvement. Possible underlying exacerbation of asthma. Refill albuterol. In office injection of Depo-Medrol provided. Sample of Breo given. Continue over-the-counter medications as needed for symptom relief and supportive care. Follow-up if symptoms worsen or fail to improve.

## 2015-04-24 NOTE — Progress Notes (Signed)
Subjective:    Patient ID: [name], male    DOB: Jan 20, 1971, 44 y.o.   MRN: [id number]  Chief Complaint  Patient presents with  . Nasal Congestion    since friday, has some chest tightness and congestion, has a bit of a cough    HPI:  [name] is a 44 y.o. male who  has a past medical history of PULMONARY SARCOIDOSIS; DIABETES MELLITUS, TYPE II; TESTICULAR HYPOFUNCTION; HYPERLIPIDEMIA; Gout, unspecified; POLYNEUROPATHY; HYPERTENSION; CORONARY ARTERY DISEASE; GERD; ERECTILE DYSFUNCTION, ORGANIC; Edema; TIA (transient ischemic attack); Arthritis; Narcolepsy without cataplexy; and Pericarditis. and presents today for an acute office visit.   This is a new problem. Associated symptoms of chest sinus and congestion with mild cough have been going on for approximately 5 days. Modifying factors include OTC medications include Vicks and Mucinex which have helped minimally with his symptoms. Does not currently have an albuterol inhaler. Denies any recent antibiotic usage.    Allergies  Allergen Reactions  . Sulfonamide Derivatives Rash and Other (See Comments)     Current Outpatient Prescriptions on File Prior to Visit  Medication Sig Dispense Refill  . allopurinol (ZYLOPRIM) 300 MG tablet Take 1 tablet (300 mg total) by mouth daily. 30 tablet 2  . aspirin EC 81 MG tablet Take 1 tablet (81 mg total) by mouth once. (Patient taking differently: Take 81 mg by mouth daily. ) 30 tablet 3  . atorvastatin (LIPITOR) 10 MG tablet Take 1 tablet (10 mg total) by mouth daily. 90 tablet 3  . colchicine (COLCRYS) 0.6 MG tablet Take 1 tablet (0.6 mg total) by mouth daily. 30 tablet 2  . diltiazem (DILTIAZEM CD) 120 MG 24 hr capsule Take 120 mg by mouth 2 (two) times daily.    Marland Kitchen FLUoxetine (PROZAC) 20 MG tablet Take 1 tablet (20 mg total) by mouth daily. 90 tablet 1  . folic acid (FOLVITE) 1 MG tablet Take 1 mg by mouth daily.     . furosemide (LASIX) 40 MG tablet Take 1 tablet (40 mg  total) by mouth daily. 90 tablet 1  . furosemide (LASIX) 40 MG tablet Take 1 tablet (40 mg total) by mouth daily. 90 tablet 3  . glucose blood (ONE TOUCH ULTRA TEST) test strip 1 each by Other route daily. Use as instructed 100 each 1  . hydrochlorothiazide (HYDRODIURIL) 25 MG tablet Take 1 tablet (25 mg total) by mouth daily. 90 tablet 1  . hydrochlorothiazide (HYDRODIURIL) 25 MG tablet Take 1 tablet (25 mg total) by mouth daily. 90 tablet 1  . levETIRAcetam (KEPPRA) 500 MG tablet Take 2 tablets (1,000 mg total) by mouth at bedtime. 60 tablet 4  . metFORMIN (GLUCOPHAGE-XR) 500 MG 24 hr tablet Take 1 tablet (500 mg total) by mouth 2 (two) times daily with a meal. 180 tablet 1  . methotrexate (RHEUMATREX) 2.5 MG tablet Take 12.5 mg by mouth once a week. Caution:Chemotherapy. Protect from light:  On Mondays    . metoprolol (LOPRESSOR) 50 MG tablet Take 1 tablet (50 mg total) by mouth 2 (two) times daily. 180 tablet 1  . metoprolol (LOPRESSOR) 50 MG tablet Take 1 tablet (50 mg total) by mouth 2 (two) times daily. 180 tablet 0  . nitroGLYCERIN (NITROSTAT) 0.4 MG SL tablet Place 1 tablet (0.4 mg total) under the tongue every 5 (five) minutes as needed for chest pain. 90 tablet 3  . phentermine 37.5 MG capsule Take 1 capsule (37.5 mg total) by mouth daily. 30 capsule 0  .  potassium chloride SA (K-DUR,KLOR-CON) 20 MEQ tablet Take 20 mEq by mouth 2 (two) times daily.    . potassium chloride SA (KLOR-CON M20) 20 MEQ tablet Take 1 tablet (20 mEq total) by mouth 2 (two) times daily. 180 tablet 2  . saw palmetto 160 MG capsule Take 160 mg by mouth daily.    . sildenafil (VIAGRA) 100 MG tablet Take 0.5-1 tablets (50-100 mg total) by mouth daily as needed for erectile dysfunction. Patient given 30 day supply---patient advised he needs office visit before any further refills 5 tablet 0  . tadalafil (CIALIS) 5 MG tablet Take 1 tablet (5 mg total) by mouth daily as needed for erectile dysfunction. 10 tablet 1  .  tadalafil (CIALIS) 5 MG tablet TAKE ONE TABLET BY MOUTH ONCE DAILY AS NEEDED 10 tablet 0  . testosterone cypionate (DEPOTESTOTERONE CYPIONATE) 200 MG/ML injection Inject 1 mL (200 mg total) into the muscle every 14 (fourteen) days. 10 mL 0   Current Facility-Administered Medications on File Prior to Visit  Medication Dose Route Frequency Provider Last Rate Last Dose  . testosterone cypionate (DEPOTESTOSTERONE CYPIONATE) injection 200 mg  200 mg Intramuscular Q14 Days Newt Lukes, MD   200 mg at 04/03/15 1013     Review of Systems  Constitutional: Negative for fever and chills.  HENT: Positive for congestion and sneezing. Negative for ear pain, sinus pressure and sore throat.   Respiratory: Positive for cough, chest tightness, shortness of breath and wheezing.   Neurological: Positive for headaches.      Objective:    BP 136/86 mmHg  Pulse 89  Temp(Src) 97.7 F (36.5 C) (Oral)  Resp 18  Ht 6\' 3"  (1.905 m)  Wt 275 lb (124.739 kg)  BMI 34.37 kg/m2  SpO2 99% Nursing note and vital signs reviewed.  Physical Exam  Constitutional: He is oriented to person, place, and time. He appears well-developed and well-nourished. No distress.  HENT:  Right Ear: Hearing, tympanic membrane, external ear and ear canal normal.  Left Ear: Hearing, tympanic membrane, external ear and ear canal normal.  Nose: Nose normal. Right sinus exhibits no maxillary sinus tenderness and no frontal sinus tenderness. Left sinus exhibits no maxillary sinus tenderness and no frontal sinus tenderness.  Mouth/Throat: Uvula is midline, oropharynx is clear and moist and mucous membranes are normal.  Cardiovascular: Normal rate, regular rhythm, normal heart sounds and intact distal pulses.   Pulmonary/Chest: Effort normal and breath sounds normal.  Neurological: He is alert and oriented to person, place, and time.  Skin: Skin is warm and dry.  Psychiatric: He has a normal mood and affect. His behavior is normal.  Judgment and thought content normal.       Assessment & Plan:   Problem List Items Addressed This Visit      Respiratory   Acute upper respiratory infection - Primary    Symptoms and exam consistent with acute upper respiratory infection given recent improvement. Possible underlying exacerbation of asthma. Refill albuterol. In office injection of Depo-Medrol provided. Sample of Breo given. Continue over-the-counter medications as needed for symptom relief and supportive care. Follow-up if symptoms worsen or fail to improve.      Relevant Medications   albuterol (PROVENTIL HFA;VENTOLIN HFA) 108 (90 BASE) MCG/ACT inhaler   Fluticasone Furoate-Vilanterol (BREO ELLIPTA) 100-25 MCG/INH AEPB

## 2015-04-24 NOTE — Patient Instructions (Signed)
Thank you for choosing [facility].  Summary/Instructions:  Your prescription(s) have been submitted to your pharmacy or been printed and provided for you. Please take as directed and contact our office if you believe you are having problem(s) with the medication(s) or have any questions.  If your symptoms worsen or fail to improve, please contact our office for further instruction, or in case of emergency go directly to the emergency room at the closest medical facility.    Upper Respiratory Infection, Adult Most upper respiratory infections (URIs) are a viral infection of the air passages leading to the lungs. A URI affects the nose, throat, and upper air passages. The most common type of URI is nasopharyngitis and is typically referred to as "the common cold." URIs run their course and usually go away on their own. Most of the time, a URI does not require medical attention, but sometimes a bacterial infection in the upper airways can follow a viral infection. This is called a secondary infection. Sinus and middle ear infections are common types of secondary upper respiratory infections. Bacterial pneumonia can also complicate a URI. A URI can worsen asthma and chronic obstructive pulmonary disease (COPD). Sometimes, these complications can require emergency medical care and may be life threatening.  CAUSES Almost all URIs are caused by viruses. A virus is a type of germ and can spread from one person to another.  RISKS FACTORS You may be at risk for a URI if:   You smoke.   You have chronic heart or lung disease.  You have a weakened defense (immune) system.   You are very young or very old.   You have nasal allergies or asthma.  You work in crowded or poorly ventilated areas.  You work in health care facilities or schools. SIGNS AND SYMPTOMS  Symptoms typically develop 2-3 days after you come in contact with a cold virus. Most viral URIs last 7-10 days. However, viral  URIs from the influenza virus (flu virus) can last 14-18 days and are typically more severe. Symptoms may include:   Runny or stuffy (congested) nose.   Sneezing.   Cough.   Sore throat.   Headache.   Fatigue.   Fever.   Loss of appetite.   Pain in your forehead, behind your eyes, and over your cheekbones (sinus pain).  Muscle aches.  DIAGNOSIS  Your health care provider may diagnose a URI by:  Physical exam.  Tests to check that your symptoms are not due to another condition such as:  Strep throat.  Sinusitis.  Pneumonia.  Asthma. TREATMENT  A URI goes away on its own with time. It cannot be cured with medicines, but medicines may be prescribed or recommended to relieve symptoms. Medicines may help:  Reduce your fever.  Reduce your cough.  Relieve nasal congestion. HOME CARE INSTRUCTIONS   Take medicines only as directed by your health care provider.   Gargle warm saltwater or take cough drops to comfort your throat as directed by your health care provider.  Use a warm mist humidifier or inhale steam from a shower to increase air moisture. This may make it easier to breathe.  Drink enough fluid to keep your urine clear or pale yellow.   Eat soups and other clear broths and maintain good nutrition.   Rest as needed.   Return to work when your temperature has returned to normal or as your health care provider advises. You may need to stay home longer to avoid infecting others.   You can also use a face mask and careful hand washing to prevent spread of the virus.  Increase the usage of your inhaler if you have asthma.   Do not use any tobacco products, including cigarettes, chewing tobacco, or electronic cigarettes. If you need help quitting, ask your health care provider. PREVENTION  The best way to protect yourself from getting a cold is to practice good hygiene.   Avoid oral or hand contact with people with cold symptoms.   Wash your  hands often if contact occurs.  There is no clear evidence that vitamin C, vitamin E, echinacea, or exercise reduces the chance of developing a cold. However, it is always recommended to get plenty of rest, exercise, and practice good nutrition.  SEEK MEDICAL CARE IF:   You are getting worse rather than better.   Your symptoms are not controlled by medicine.   You have chills.  You have worsening shortness of breath.  You have brown or red mucus.  You have yellow or brown nasal discharge.  You have pain in your face, especially when you bend forward.  You have a fever.  You have swollen neck glands.  You have pain while swallowing.  You have white areas in the back of your throat. SEEK IMMEDIATE MEDICAL CARE IF:   You have severe or persistent:  Headache.  Ear pain.  Sinus pain.  Chest pain.  You have chronic lung disease and any of the following:  Wheezing.  Prolonged cough.  Coughing up blood.  A change in your usual mucus.  You have a stiff neck.  You have changes in your:  Vision.  Hearing.  Thinking.  Mood. MAKE SURE YOU:   Understand these instructions.  Will watch your condition.  Will get help right away if you are not doing well or get worse.   This information is not intended to replace advice given to you by your health care provider. Make sure you discuss any questions you have with your health care provider.   Document Released: 11/18/2000 Document Revised: 10/09/2014 Document Reviewed: 08/30/2013 Elsevier Interactive Patient Education 2016 [employer] Inc[employer]  

## 2015-04-24 NOTE — Progress Notes (Signed)
Pre visit review using our clinic review tool, if applicable. No additional management support is needed unless otherwise documented below in the visit note. 

## 2015-04-29 ENCOUNTER — Ambulatory Visit (INDEPENDENT_AMBULATORY_CARE_PROVIDER_SITE_OTHER): Payer: Medicare Other | Admitting: Internal Medicine

## 2015-04-29 ENCOUNTER — Other Ambulatory Visit (INDEPENDENT_AMBULATORY_CARE_PROVIDER_SITE_OTHER): Payer: Medicare Other

## 2015-04-29 ENCOUNTER — Encounter: Payer: Self-pay | Admitting: Internal Medicine

## 2015-04-29 VITALS — BP 116/70 | HR 81 | Temp 98.4°F | Ht 75.0 in | Wt 279.0 lb

## 2015-04-29 DIAGNOSIS — N529 Male erectile dysfunction, unspecified: Secondary | ICD-10-CM

## 2015-04-29 DIAGNOSIS — J45990 Exercise induced bronchospasm: Secondary | ICD-10-CM

## 2015-04-29 DIAGNOSIS — Z0000 Encounter for general adult medical examination without abnormal findings: Secondary | ICD-10-CM

## 2015-04-29 DIAGNOSIS — E669 Obesity, unspecified: Secondary | ICD-10-CM

## 2015-04-29 DIAGNOSIS — E119 Type 2 diabetes mellitus without complications: Secondary | ICD-10-CM

## 2015-04-29 DIAGNOSIS — E1169 Type 2 diabetes mellitus with other specified complication: Secondary | ICD-10-CM

## 2015-04-29 DIAGNOSIS — M109 Gout, unspecified: Secondary | ICD-10-CM | POA: Diagnosis not present

## 2015-04-29 DIAGNOSIS — E291 Testicular hypofunction: Secondary | ICD-10-CM

## 2015-04-29 DIAGNOSIS — D869 Sarcoidosis, unspecified: Secondary | ICD-10-CM

## 2015-04-29 DIAGNOSIS — Z23 Encounter for immunization: Secondary | ICD-10-CM

## 2015-04-29 LAB — BASIC METABOLIC PANEL
BUN: 11 mg/dL (ref 6–23)
CO2: 30 mEq/L (ref 19–32)
Calcium: 9.4 mg/dL (ref 8.4–10.5)
Chloride: 100 mEq/L (ref 96–112)
Creatinine, Ser: 0.83 mg/dL (ref 0.40–1.50)
GFR: 130.42 mL/min (ref >60.00)
Glucose, Bld: 153 mg/dL — ABNORMAL HIGH (ref 70–99)
Potassium: 4 mEq/L (ref 3.5–5.1)
Sodium: 138 mEq/L (ref 135–145)

## 2015-04-29 LAB — LIPID PANEL
Cholesterol: 191 mg/dL (ref 0–200)
HDL: 45 mg/dL (ref >39.00)
LDL Cholesterol: 122 mg/dL — ABNORMAL HIGH (ref 0–99)
NonHDL: 145.95
Total CHOL/HDL Ratio: 4
Triglycerides: 120 mg/dL (ref 0.0–149.0)
VLDL: 24 mg/dL (ref 0.0–40.0)

## 2015-04-29 LAB — MICROALBUMIN / CREATININE URINE RATIO
Creatinine,U: 146.9 mg/dL
Microalb Creat Ratio: 1 mg/g (ref 0.0–30.0)
Microalb, Ur: 1.4 mg/dL (ref 0.0–1.9)

## 2015-04-29 LAB — HEMOGLOBIN A1C: Hgb A1c MFr Bld: 7.3 % — ABNORMAL HIGH (ref 4.6–6.5)

## 2015-04-29 MED ORDER — TESTOSTERONE CYPIONATE 200 MG/ML IM SOLN: 200.0000 mg | INTRAMUSCULAR | Status: DC

## 2015-04-29 MED ORDER — TADALAFIL 20 MG PO TABS: 10.0000 mg | ORAL_TABLET | ORAL | Status: DC | PRN

## 2015-04-29 MED ORDER — LEVALBUTEROL HCL 0.63 MG/3ML IN NEBU: 0.6300 mg | INHALATION_SOLUTION | Freq: Four times a day (QID) | RESPIRATORY_TRACT | Status: DC | PRN

## 2015-04-29 MED ORDER — BUDESONIDE 0.25 MG/2ML IN SUSP: 0.2500 mg | Freq: Two times a day (BID) | RESPIRATORY_TRACT | Status: DC

## 2015-04-29 MED ORDER — ASPIRIN EC 81 MG PO TBEC: 81.0000 mg | DELAYED_RELEASE_TABLET | Freq: Every day | ORAL | Status: AC

## 2015-04-29 MED ORDER — ATORVASTATIN CALCIUM 10 MG PO TABS: 10.0000 mg | ORAL_TABLET | Freq: Every day | ORAL | Status: DC

## 2015-04-29 NOTE — Assessment & Plan Note (Signed)
Lab Results  Component Value Date   HGBA1C 6.6* 11/22/2013   Historically followed with endo/[facility] intermittently - no OV with endo in past 47mo improved glycemic control off pred since early 2012 Check a1c and annual microalb/cr annually - titrate meds as needed Off januvia fall 2012 and decreased dose metformin fall 2012 due to good a1c control On ASA -  declines statin or ARB

## 2015-04-29 NOTE — Patient Instructions (Addendum)
It was good to see you today.  We have reviewed your prior records including labs and tests today  Prevnar 13 pneumonia vaccine updated today - other Health Maintenance reviewed - all recommended immunizations and age-appropriate screenings are up-to-date.  Test(s) ordered today. Your results will be released to MyChart (or called to you) after review, usually within 72hours after test completion. If any changes need to be made, you will be notified at that same time.  Testosterone injection administered today  Other Medications reviewed and updated Increase dose Cialis as discussed Also resume nebulized treatment for asthma and sarcoid -Pulmicort and Xopenex per prior pulmonary prescription; stop Breo inhaler once these nebulizers resumed No other changes recommended at this time.  Will refer to local urologist and renewed appointment with rheumatologist as discussed -my office and call these appointments once arranged  Please schedule followup in 3 months with new PCP for followup and labs, call sooner if problems.  Health Maintenance, Male A healthy lifestyle and preventative care can promote health and wellness.  Maintain regular health, dental, and eye exams.  Eat a healthy diet. Foods like vegetables, fruits, whole grains, low-fat dairy products, and lean protein foods contain the nutrients you need and are low in calories. Decrease your intake of foods high in solid fats, added sugars, and salt. Get information about a proper diet from your health care provider, if necessary.  Regular physical exercise is one of the most important things you can do for your health. Most adults should get at least 150 minutes of moderate-intensity exercise (any activity that increases your heart rate and causes you to sweat) each week. In addition, most adults need muscle-strengthening exercises on 2 or more days a week.   Maintain a healthy weight. The body mass index (BMI) is a screening tool to  identify possible weight problems. It provides an estimate of body fat based on height and weight. Your health care provider can find your BMI and can help you achieve or maintain a healthy weight. For males 20 years and older:  A BMI below 18.5 is considered underweight.  A BMI of 18.5 to 24.9 is normal.  A BMI of 25 to 29.9 is considered overweight.  A BMI of 30 and above is considered obese.  Maintain normal blood lipids and cholesterol by exercising and minimizing your intake of saturated fat. Eat a balanced diet with plenty of fruits and vegetables. Blood tests for lipids and cholesterol should begin at age 61 and be repeated every 5 years. If your lipid or cholesterol levels are high, you are over age 37, or you are at high risk for heart disease, you may need your cholesterol levels checked more frequently.Ongoing high lipid and cholesterol levels should be treated with medicines if diet and exercise are not working.  If you smoke, find out from your health care provider how to quit. If you do not use tobacco, do not start.  Lung cancer screening is recommended for adults aged 55-80 years who are at high risk for developing lung cancer because of a history of smoking. A yearly low-dose CT scan of the lungs is recommended for people who have at least a 30-pack-year history of smoking and are current smokers or have quit within the past 15 years. A pack year of smoking is smoking an average of 1 pack of cigarettes a day for 1 year (for example, a 30-pack-year history of smoking could mean smoking 1 pack a day for 30 years or 2  packs a day for 15 years). Yearly screening should continue until the smoker has stopped smoking for at least 15 years. Yearly screening should be stopped for people who develop a health problem that would prevent them from having lung cancer treatment.  If you choose to drink alcohol, do not have more than 2 drinks per day. One drink is considered to be 12 oz (360 mL) of  beer, 5 oz (150 mL) of wine, or 1.5 oz (45 mL) of liquor.  Avoid the use of street drugs. Do not share needles with anyone. Ask for help if you need support or instructions about stopping the use of drugs.  High blood pressure causes heart disease and increases the risk of stroke. High blood pressure is more likely to develop in:  People who have blood pressure in the end of the normal range (100-139/85-89 mm Hg).  People who are overweight or obese.  People who are African American.  If you are 69-35 years of age, have your blood pressure checked every 3-5 years. If you are 20 years of age or older, have your blood pressure checked every year. You should have your blood pressure measured twice--once when you are at a hospital or clinic, and once when you are not at a hospital or clinic. Record the average of the two measurements. To check your blood pressure when you are not at a hospital or clinic, you can use:  An automated blood pressure machine at a pharmacy.  A home blood pressure monitor.  If you are 69-24 years old, ask your health care provider if you should take aspirin to prevent heart disease.  Diabetes screening involves taking a blood sample to check your fasting blood sugar level. This should be done once every 3 years after age 9 if you are at a normal weight and without risk factors for diabetes. Testing should be considered at a younger age or be carried out more frequently if you are overweight and have at least 1 risk factor for diabetes.  Colorectal cancer can be detected and often prevented. Most routine colorectal cancer screening begins at the age of 2 and continues through age 25. However, your health care provider may recommend screening at an earlier age if you have risk factors for colon cancer. On a yearly basis, your health care provider may provide home test kits to check for hidden blood in the stool. A small camera at the end of a tube may be used to directly  examine the colon (sigmoidoscopy or colonoscopy) to detect the earliest forms of colorectal cancer. Talk to your health care provider about this at age 44 when routine screening begins. A direct exam of the colon should be repeated every 5-10 years through age 48, unless early forms of precancerous polyps or small growths are found.  People who are at an increased risk for hepatitis B should be screened for this virus. You are considered at high risk for hepatitis B if:  You were born in a country where hepatitis B occurs often. Talk with your health care provider about which countries are considered high risk.  Your parents were born in a high-risk country and you have not received a shot to protect against hepatitis B (hepatitis B vaccine).  You have HIV or AIDS.  You use needles to inject street drugs.  You live with, or have sex with, someone who has hepatitis B.  You are a man who has sex with other men (MSM).  You get hemodialysis treatment.  You take certain medicines for conditions like cancer, organ transplantation, and autoimmune conditions.  Hepatitis C blood testing is recommended for all people born from [birth year] through 1963 and any individual with known risk factors for hepatitis C.  Healthy men should no longer receive prostate-specific antigen (PSA) blood tests as part of routine cancer screening. Talk to your health care provider about prostate cancer screening.  Testicular cancer screening is not recommended for adolescents or adult males who have no symptoms. Screening includes self-exam, a health care provider exam, and other screening tests. Consult with your health care provider about any symptoms you have or any concerns you have about testicular cancer.  Practice safe sex. Use condoms and avoid high-risk sexual practices to reduce the spread of sexually transmitted infections (STIs).  You should be screened for STIs, including gonorrhea and chlamydia if:  You are  sexually active and are younger than 24 years.  You are older than 24 years, and your health care provider tells you that you are at risk for this type of infection.  Your sexual activity has changed since you were last screened, and you are at an increased risk for chlamydia or gonorrhea. Ask your health care provider if you are at risk.  If you are at risk of being infected with HIV, it is recommended that you take a prescription medicine daily to prevent HIV infection. This is called pre-exposure prophylaxis (PrEP). You are considered at risk if:  You are a man who has sex with other men (MSM).  You are a heterosexual man who is sexually active with multiple partners.  You take drugs by injection.  You are sexually active with a partner who has HIV.  Talk with your health care provider about whether you are at high risk of being infected with HIV. If you choose to begin PrEP, you should first be tested for HIV. You should then be tested every 3 months for as long as you are taking PrEP.  Use sunscreen. Apply sunscreen liberally and repeatedly throughout the day. You should seek shade when your shadow is shorter than you. Protect yourself by wearing long sleeves, pants, a wide-brimmed hat, and sunglasses year round whenever you are outdoors.  Tell your health care provider of new moles or changes in moles, especially if there is a change in shape or color. Also, tell your health care provider if a mole is larger than the size of a pencil eraser.  A one-time screening for abdominal aortic aneurysm (AAA) and surgical repair of large AAAs by ultrasound is recommended for men aged 65-75 years who are current or former smokers.  Stay current with your vaccines (immunizations).   This information is not intended to replace advice given to you by your health care provider. Make sure you discuss any questions you have with your health care provider.   Document Released: 11/21/2007 Document  Revised: 06/15/2014 Document Reviewed: 10/20/2010 Elsevier Interactive Patient Education [employer]

## 2015-04-29 NOTE — Assessment & Plan Note (Addendum)
Follows with rheum (mirsha - [facility]) and pulm (sood - [facility]) but not seen in past 12 mo by [facility] due to high co pays - scheduled for followup in next 4 months Scheduled f/u with neuro Craige Cotta -[facility]) next month for same, but has also run out of Keppra - off same since 11/2014 symptoms generally controlled and stable on MTX, but no refills so not on same since 04/2014 (past 48mo) -  Prev used pred only when unable to afford MTX with insurance issues, steroid injection last week with URI sx Refill on nebs today

## 2015-04-29 NOTE — Assessment & Plan Note (Signed)
Takes allopurinol and colchicine for same - refill prn

## 2015-04-29 NOTE — Progress Notes (Signed)
Subjective:    Patient ID: [name], male    DOB: 1971/04/19, 44 y.o.   MRN: [id number]  HPI   Here for medicare wellness/annual physical  Diet: heart healthy & diabetic Physical activity: sedentary Depression/mood screen: negative Hearing: intact to whispered voice Visual acuity: grossly normal, performs annual eye exam  ADLs: capable Fall risk: none Home safety: good Cognitive evaluation: intact to orientation, naming, recall and repetition EOL planning: adv directives, full code/ I agree  I have personally reviewed and have noted 1. The patient's medical and social history 2. Their use of alcohol, tobacco or illicit drugs 3. Their current medications and supplements 4. The patient's functional ability including ADL's, fall risks, home safety risks and hearing or visual impairment. 5. Diet and physical activities 6. Evidence for depression or mood disorders  Also reviewed chronic medical conditions, interval events and current concerns  Past Medical History  Diagnosis Date  . PULMONARY SARCOIDOSIS     Mediastinal lymphadenopathy with biospy proven sarcodosis  . DIABETES MELLITUS, TYPE II   . TESTICULAR HYPOFUNCTION   . HYPERLIPIDEMIA   . Gout, unspecified   . POLYNEUROPATHY   . HYPERTENSION   . CORONARY ARTERY DISEASE     cath 05/18/14: mild LAD dz, otherwise normal  . GERD   . ERECTILE DYSFUNCTION, ORGANIC   . Edema   . TIA (transient ischemic attack)   . Arthritis     ? 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 Followed by Dr. Nickola Major  . Narcolepsy without cataplexy     MSLT 01/09/09 & MRI brain 01/09/09  . Pericarditis     recurrent   Family History  Problem Relation Age of Onset  . Diabetes Mother   . Diabetes Father   . Heart disease Father   . Hypertension Mother   . Heart failure Father    Social History  Substance Use Topics  . Smoking status: Former Smoker -- 15 years    Types: Cigarettes    Quit date: 07/09/2008  . Smokeless  tobacco: None  . Alcohol Use: 0.0 oz/week    0 Standard drinks or equivalent per week     Comment: occ    Review of Systems  Constitutional: Positive for fatigue (chronic, unchanged). Negative for fever, activity change, appetite change and unexpected weight change.  Respiratory: Positive for cough (since last 2 weeks, seen last week for URI but not improving) and chest tightness (at night x 1 week due to cough). Negative for shortness of breath and wheezing.   Cardiovascular: Negative for chest pain, palpitations and leg swelling.  Genitourinary:       Unchanged ED, using Cialis prn, ?less effective than prev  Skin: Negative for rash and wound.  Neurological: Negative for dizziness, weakness and headaches.  Psychiatric/Behavioral: Positive for sleep disturbance (chronic hypersomnia w/o change). Negative for dysphoric mood. The patient is not nervous/anxious.   All other systems reviewed and are negative.  Patient Care Team: Newt Lukes, MD as PCP - General (Internal Medicine) Virgilio Frees as Consulting Physician (Rheumatology) Loyal Gambler, MD as Referring Physician (Rheumatology) [name], MD as Consulting Physician (Pulmonary Disease) Romero Belling, MD as Consulting Physician (Endocrinology) Lars Masson, MD (Cardiology)     Objective:    Physical Exam  Constitutional: He is oriented to person, place, and time. He appears well-developed and well-nourished. No distress.  obese  HENT:  Head: Normocephalic and atraumatic.  Nose: Nose normal.  Mouth/Throat: Oropharynx is clear and moist.  Hearing grossly normal.  Eyes: Conjunctivae and EOM are normal. Pupils are equal, round, and reactive to light. No scleral icterus.  Neck: Normal range of motion. Neck supple. No JVD present. No thyromegaly present.  Cardiovascular: Normal rate, regular rhythm, normal heart sounds and intact distal pulses.  Exam reveals no friction rub.   No murmur heard. No edema.    Pulmonary/Chest: Effort normal and breath sounds normal. No respiratory distress. He has no wheezes.  Abdominal: Soft. Bowel sounds are normal. He exhibits no distension and no mass. There is no tenderness. There is no guarding.  Genitourinary:  defer  Musculoskeletal: Normal range of motion. He exhibits no edema or tenderness.  Lymphadenopathy:    He has no cervical adenopathy.  Neurological: He is alert and oriented to person, place, and time. He has normal reflexes. No cranial nerve deficit.  Skin: Skin is warm and dry. No rash noted. No erythema.  Psychiatric: He has a normal mood and affect. His behavior is normal. Thought content normal.  Vitals reviewed.   BP 116/70 mmHg  Pulse 81  Temp(Src) 98.4 F (36.9 C) (Oral)  Ht 6\' 3"  (1.905 m)  Wt 279 lb (126.554 kg)  BMI 34.87 kg/m2  SpO2 98% Wt Readings from Last 3 Encounters:  04/29/15 279 lb (126.554 kg)  04/24/15 275 lb (124.739 kg)  09/25/14 269 lb (122.018 kg)     Lab Results  Component Value Date   WBC 5.8 05/14/2014   HGB 16.9 05/14/2014   HCT 50.4 05/14/2014   PLT 310.0 05/14/2014   GLUCOSE 121* 05/14/2014   CHOL 189 11/22/2013   TRIG 315.0* 11/22/2013   HDL 41.50 11/22/2013   LDLCALC 85 11/22/2013   ALT 42 03/04/2014   AST 22 03/04/2014   NA 138 05/14/2014   K 3.9 05/14/2014   CL 98 05/14/2014   CREATININE 0.8 05/14/2014   BUN 9 05/14/2014   CO2 31 05/14/2014   TSH 0.41 11/22/2013   INR 1.0 05/14/2014   HGBA1C 6.6* 11/22/2013   MICROALBUR 2.0* 11/22/2013    No results found.     Assessment & Plan:   CPX/z00.00 - Patient has been counseled on age-appropriate routine health concerns for screening and prevention. These are reviewed and up-to-date. Immunizations are up-to-date or declined. Labs ordered and reviewed.  Problem List Items Addressed This Visit    Diabetes mellitus type 2 in obese [facility])    Lab Results  Component Value Date   HGBA1C 6.6* 11/22/2013   Historically followed with  endo/[facility] intermittently - no OV with endo in past 95mo improved glycemic control off pred since early 2012 Check a1c and annual microalb/cr annually - titrate meds as needed Off januvia fall 2012 and decreased dose metformin fall 2012 due to good a1c control On ASA -  declines statin or ARB      Relevant Medications   aspirin EC 81 MG tablet   atorvastatin (LIPITOR) 10 MG tablet   Other Relevant Orders   Hemoglobin A1c   Basic metabolic panel   Lipid panel   Microalbumin / creatinine urine ratio   ERECTILE DYSFUNCTION, ORGANIC    Has been prescribed Cialis by urologist - increase dose of same now for prn use Intermittent trial of Viagra less effective per pt preference Also refer back to uro for followup on associated hypogonadism       Relevant Orders   Ambulatory referral to Urology   Exercise-induced asthma    Refill prior nebs as feels pulmicort and xopinex nebs more effective than  other inhalers Pending f/u with pulm 08/2015 with Sood      Relevant Medications   budesonide (PULMICORT) 0.25 MG/2ML nebulizer solution   levalbuterol (XOPENEX) 0.63 MG/3ML nebulizer solution   GOUT, UNSPECIFIED    Takes allopurinol and colchicine for same - refill prn      Male hypogonadism    Dx 10/2009 - associated with ED symptoms IM testosterone q 2 weeks as per uro orders      Relevant Orders   Ambulatory referral to Urology   Sarcoidosis (HCC)    Follows with rheum (mirsha - [facility]) and pulm (sood - [facility]) but not seen in past 12 mo by [facility] due to high co pays - scheduled for followup in next 4 months Scheduled f/u with neuro Craige Cotta -[facility]) next month for same, but has also run out of Keppra - off same since 11/2014 symptoms generally controlled and stable on MTX, but no refills so not on same since 04/2014 (past 31mo) -  Prev used pred only when unable to afford MTX with insurance issues, steroid injection last week with URI sx Refill on nebs today      Relevant Orders   Ambulatory  referral to Rheumatology    Other Visit Diagnoses    Routine general medical examination at a health care facility    -  Primary        Rene Paci, MD

## 2015-04-29 NOTE — Assessment & Plan Note (Signed)
Has been prescribed Cialis by urologist - increase dose of same now for prn use Intermittent trial of Viagra less effective per pt preference Also refer back to uro for followup on associated hypogonadism

## 2015-04-29 NOTE — Progress Notes (Signed)
Pre visit review using our clinic review tool, if applicable. No additional management support is needed unless otherwise documented below in the visit note. 

## 2015-04-29 NOTE — Assessment & Plan Note (Signed)
Dx 10/2009 - associated with ED symptoms IM testosterone q 2 weeks as per uro orders

## 2015-04-29 NOTE — Assessment & Plan Note (Signed)
Refill prior nebs as feels pulmicort and xopinex nebs more effective than other inhalers Pending f/u with pulm 08/2015 with Craige Cotta

## 2015-04-30 ENCOUNTER — Telehealth: Payer: Self-pay | Admitting: Internal Medicine

## 2015-04-30 NOTE — Telephone Encounter (Signed)
Pt called in and said that the nebulizer was called in incorrectly, with wrong code.  Ins is not coving it.  Can you contact pt and or pharmacy to see what they are needing   [facility]

## 2015-05-01 ENCOUNTER — Ambulatory Visit (INDEPENDENT_AMBULATORY_CARE_PROVIDER_SITE_OTHER): Payer: Medicare Other

## 2015-05-01 DIAGNOSIS — E291 Testicular hypofunction: Secondary | ICD-10-CM | POA: Diagnosis not present

## 2015-05-01 MED ORDER — TESTOSTERONE CYPIONATE 200 MG/ML IM SOLN
200.0000 mg | Freq: Once | INTRAMUSCULAR | Status: AC
  Administered 2015-05-01: 200 mg via INTRAMUSCULAR

## 2015-05-01 MED ORDER — BUDESONIDE 0.25 MG/2ML IN SUSP: 0.2500 mg | Freq: Two times a day (BID) | RESPIRATORY_TRACT | Status: DC

## 2015-05-01 MED ORDER — LEVALBUTEROL HCL 0.63 MG/3ML IN NEBU: 0.6300 mg | INHALATION_SOLUTION | Freq: Four times a day (QID) | RESPIRATORY_TRACT | Status: DC | PRN

## 2015-05-01 NOTE — Telephone Encounter (Signed)
Spoke to pt while he was in the office and confirmed what he needed with his rx and gave lab results.   Contact pharmacy about erx sent with new dx code was received. [facility] confirmed that [facility] was [facility].

## 2015-05-01 NOTE — Telephone Encounter (Signed)
Patient called to advise that he needs the budesonide (PULMICORT) 0.25 MG/2ML nebulizer solutionsent to a different pharmacy so that it is covered.   [facility] on [address]

## 2015-05-01 NOTE — Addendum Note (Signed)
Addended by: Verlan Friends on: 05/01/2015 04:22 PM   Modules accepted: Orders

## 2015-05-01 NOTE — Telephone Encounter (Signed)
erx sent   Phoned pt and informed of same.

## 2015-05-06 ENCOUNTER — Other Ambulatory Visit: Payer: Self-pay | Admitting: Neurology

## 2015-05-06 ENCOUNTER — Encounter: Payer: Self-pay | Admitting: Internal Medicine

## 2015-05-06 ENCOUNTER — Other Ambulatory Visit: Payer: Self-pay

## 2015-05-06 DIAGNOSIS — G40909 Epilepsy, unspecified, not intractable, without status epilepticus: Secondary | ICD-10-CM

## 2015-05-06 MED ORDER — BUDESONIDE 0.25 MG/2ML IN SUSP: 0.2500 mg | Freq: Two times a day (BID) | RESPIRATORY_TRACT | Status: DC

## 2015-05-16 ENCOUNTER — Ambulatory Visit (INDEPENDENT_AMBULATORY_CARE_PROVIDER_SITE_OTHER): Payer: Medicare Other

## 2015-05-16 DIAGNOSIS — E291 Testicular hypofunction: Secondary | ICD-10-CM

## 2015-05-16 MED ORDER — TESTOSTERONE CYPIONATE 200 MG/ML IM SOLN
200.0000 mg | Freq: Once | INTRAMUSCULAR | Status: AC
  Administered 2015-05-16: 200 mg via INTRAMUSCULAR

## 2015-05-17 ENCOUNTER — Ambulatory Visit
Admission: RE | Admit: 2015-05-17 | Discharge: 2015-05-17 | Disposition: A | Discharge status: Home / Self Care | Payer: Medicare Other | Source: Ambulatory Visit | Attending: Neurology | Admitting: Neurology

## 2015-05-17 DIAGNOSIS — G40909 Epilepsy, unspecified, not intractable, without status epilepticus: Secondary | ICD-10-CM

## 2015-05-17 MED ORDER — GADOBENATE DIMEGLUMINE 529 MG/ML IV SOLN
10.0000 mL | Freq: Once | INTRAVENOUS | Status: AC | PRN
  Administered 2015-05-17: 10 mL via INTRAVENOUS

## 2015-05-21 ENCOUNTER — Ambulatory Visit (INDEPENDENT_AMBULATORY_CARE_PROVIDER_SITE_OTHER): Payer: Medicare Other | Admitting: Family

## 2015-05-21 ENCOUNTER — Other Ambulatory Visit: Payer: Self-pay

## 2015-05-21 ENCOUNTER — Encounter: Payer: Self-pay | Admitting: Family

## 2015-05-21 VITALS — BP 112/78 | HR 77 | Temp 98.0°F | Resp 18 | Ht 75.0 in | Wt 272.4 lb

## 2015-05-21 DIAGNOSIS — E669 Obesity, unspecified: Secondary | ICD-10-CM

## 2015-05-21 DIAGNOSIS — Z711 Person with feared health complaint in whom no diagnosis is made: Secondary | ICD-10-CM

## 2015-05-21 DIAGNOSIS — E119 Type 2 diabetes mellitus without complications: Secondary | ICD-10-CM | POA: Diagnosis not present

## 2015-05-21 DIAGNOSIS — E1169 Type 2 diabetes mellitus with other specified complication: Secondary | ICD-10-CM

## 2015-05-21 NOTE — Patient Instructions (Signed)
Thank you for choosing [facility].  Summary/Instructions:  Please stop by the lab on the basement level of the building for your blood work. Your results will be released to MyChart (or called to you) after review, usually within 72 hours after test completion. If any changes need to be made, you will be notified at that same time.  If your symptoms worsen or fail to improve, please contact our office for further instruction, or in case of emergency go directly to the emergency room at the closest medical facility.     

## 2015-05-21 NOTE — Assessment & Plan Note (Signed)
Priestley start any relationship and requesting testing for sexual transmitted diseases. Obtain RPR, HSV-1, HSV 2, HIV, and GC/chlamydia. Treatment pending results if necessary.

## 2015-05-21 NOTE — Assessment & Plan Note (Signed)
A1c of 7.3 last office visit. Takes medications as prescribed and denies adverse side effects. Continue current dosage of metformin. Pneumovax completed. Continue current dosage of atorvastatin for CAD risk reduction. Follow-up in 3 months for A1c.

## 2015-05-21 NOTE — Progress Notes (Signed)
Pre visit review using our clinic review tool, if applicable. No additional management support is needed unless otherwise documented below in the visit note. 

## 2015-05-21 NOTE — Progress Notes (Signed)
Subjective:    Patient ID: [name], male    DOB: 1970-12-09, 44 y.o.   MRN: [id number]  Chief Complaint  Patient presents with  . Follow-up    diabetes    HPI:  [name] is a 44 y.o. male who  has a past medical history of PULMONARY SARCOIDOSIS; DIABETES MELLITUS, TYPE II; TESTICULAR HYPOFUNCTION; HYPERLIPIDEMIA; Gout, unspecified; POLYNEUROPATHY; HYPERTENSION; CORONARY ARTERY DISEASE; GERD; ERECTILE DYSFUNCTION, ORGANIC; Edema; TIA (transient ischemic attack); Arthritis; Narcolepsy without cataplexy; and Pericarditis. and presents today for a follow up office visit.   1.) Diabetes - currently maintained on metformin. Takes medications prescribed and denies adverse side effects. Blood sugar readings at home. Due for an eye exam. Does currently take atorvastatin for cholesterol and CAD risk reduction. Does not currently take his blood sugar at home.   Lab Results  Component Value Date   HGBA1C 7.3* 04/29/2015   2.)  Concern for STD - Recently started a new relationship and has concerns for potential STI. Denies any current symptoms including fever, chills, penile discharge or pain. Denies rashes.   Allergies  Allergen Reactions  . Sulfonamide Derivatives Rash and Other (See Comments)     Current Outpatient Prescriptions on File Prior to Visit  Medication Sig Dispense Refill  . albuterol (PROVENTIL HFA;VENTOLIN HFA) 108 (90 BASE) MCG/ACT inhaler Inhale 2 puffs into the lungs every 6 (six) hours as needed for wheezing or shortness of breath. 1 Inhaler 0  . allopurinol (ZYLOPRIM) 300 MG tablet Take 1 tablet (300 mg total) by mouth daily. 30 tablet 2  . aspirin EC 81 MG tablet Take 1 tablet (81 mg total) by mouth daily. 30 tablet 3  . atorvastatin (LIPITOR) 10 MG tablet Take 1 tablet (10 mg total) by mouth daily. 90 tablet 3  . budesonide (PULMICORT) 0.25 MG/2ML nebulizer solution Take 2 mLs (0.25 mg total) by nebulization 2 (two) times daily. DX: J44.0, D86.9, J06.9  60 mL 11  . colchicine (COLCRYS) 0.6 MG tablet Take 1 tablet (0.6 mg total) by mouth daily. 30 tablet 2  . furosemide (LASIX) 40 MG tablet Take 1 tablet (40 mg total) by mouth daily. 90 tablet 3  . glucose blood (ONE TOUCH ULTRA TEST) test strip 1 each by Other route daily. Use as instructed 100 each 1  . hydrochlorothiazide (HYDRODIURIL) 25 MG tablet Take 1 tablet (25 mg total) by mouth daily. 90 tablet 1  . levalbuterol (XOPENEX) 0.63 MG/3ML nebulizer solution Take 3 mLs (0.63 mg total) by nebulization every 6 (six) hours as needed. DX: D86.9 and DX: J06.9 120 mL 5  . metFORMIN (GLUCOPHAGE-XR) 500 MG 24 hr tablet Take 1 tablet (500 mg total) by mouth 2 (two) times daily with a meal. 180 tablet 1  . metoprolol (LOPRESSOR) 50 MG tablet Take 1 tablet (50 mg total) by mouth 2 (two) times daily. 180 tablet 1  . nitroGLYCERIN (NITROSTAT) 0.4 MG SL tablet Place 1 tablet (0.4 mg total) under the tongue every 5 (five) minutes as needed for chest pain. 90 tablet 3  . phentermine 37.5 MG capsule Take 1 capsule (37.5 mg total) by mouth daily. 30 capsule 0  . potassium chloride SA (KLOR-CON M20) 20 MEQ tablet Take 1 tablet (20 mEq total) by mouth 2 (two) times daily. 180 tablet 2  . saw palmetto 160 MG capsule Take 160 mg by mouth daily.    . tadalafil (CIALIS) 20 MG tablet Take 0.5-1 tablets (10-20 mg total) by mouth every 3 (three)  days as needed for erectile dysfunction. 10 tablet 1  . testosterone cypionate (DEPOTESTOSTERONE CYPIONATE) 200 MG/ML injection Inject 1 mL (200 mg total) into the muscle every 14 (fourteen) days. 10 mL 1   No current facility-administered medications on file prior to visit.    Review of Systems  Constitutional: Negative for fever and chills.  Genitourinary: Negative for dysuria, urgency, frequency, penile swelling, penile pain and testicular pain.  Skin: Negative for rash.      Objective:    BP 112/78 mmHg  Pulse 77  Temp(Src) 98 F (36.7 C) (Oral)  Resp 18  Ht 6'  3" (1.905 m)  Wt 272 lb 6.4 oz (123.56 kg)  BMI 34.05 kg/m2  SpO2 98% Nursing note and vital signs reviewed.  Physical Exam  Constitutional: He is oriented to person, place, and time. He appears well-developed and well-nourished. No distress.  Cardiovascular: Normal rate, regular rhythm, normal heart sounds and intact distal pulses.   Pulmonary/Chest: Effort normal and breath sounds normal.  Neurological: He is alert and oriented to person, place, and time.  Skin: Skin is warm and dry.  Psychiatric: He has a normal mood and affect. His behavior is normal. Judgment and thought content normal.       Assessment & Plan:   Problem List Items Addressed This Visit      Endocrine   Diabetes mellitus type 2 in obese (HCC)    A1c of 7.3 last office visit. Takes medications as prescribed and denies adverse side effects. Continue current dosage of metformin. Pneumovax completed. Continue current dosage of atorvastatin for CAD risk reduction. Follow-up in 3 months for A1c.        Other   Concern about STD in male without diagnosis - Primary    Priestley start any relationship and requesting testing for sexual transmitted diseases. Obtain RPR, HSV-1, HSV 2, HIV, and GC/chlamydia. Treatment pending results if necessary.      Relevant Orders   GC/chlamydia probe amp, urine   HIV antibody   RPR   HSV 2 antibody, IgG   HSV 1 antibody, IgG

## 2015-05-22 ENCOUNTER — Encounter: Payer: Self-pay | Admitting: Family

## 2015-05-22 LAB — GC/CHLAMYDIA PROBE AMP, URINE
Chlamydia, Swab/Urine, PCR: NOT DETECTED
GC Probe Amp, Urine: NOT DETECTED

## 2015-05-22 LAB — HSV 1 ANTIBODY, IGG: HSV 1 Glycoprotein G Ab, IgG: 11.41 IV — ABNORMAL HIGH

## 2015-05-22 LAB — HSV 2 ANTIBODY, IGG: HSV 2 Glycoprotein G Ab, IgG: 0.16 IV

## 2015-05-22 LAB — HIV ANTIBODY (ROUTINE TESTING W REFLEX): HIV 1&2 Ab, 4th Generation: NONREACTIVE

## 2015-05-22 LAB — RPR

## 2015-06-05 ENCOUNTER — Ambulatory Visit (INDEPENDENT_AMBULATORY_CARE_PROVIDER_SITE_OTHER): Payer: Medicare Other | Admitting: Geriatric Medicine

## 2015-06-05 DIAGNOSIS — E291 Testicular hypofunction: Secondary | ICD-10-CM | POA: Diagnosis not present

## 2015-06-05 MED ORDER — TESTOSTERONE CYPIONATE 200 MG/ML IM SOLN
200.0000 mg | INTRAMUSCULAR | Status: DC
  Administered 2015-06-05: 200 mg via INTRAMUSCULAR

## 2015-06-20 ENCOUNTER — Ambulatory Visit (INDEPENDENT_AMBULATORY_CARE_PROVIDER_SITE_OTHER): Payer: Medicare Other

## 2015-06-20 ENCOUNTER — Other Ambulatory Visit: Payer: Self-pay

## 2015-06-20 DIAGNOSIS — E291 Testicular hypofunction: Secondary | ICD-10-CM | POA: Diagnosis not present

## 2015-06-20 MED ORDER — TESTOSTERONE CYPIONATE 200 MG/ML IM SOLN
200.0000 mg | Freq: Once | INTRAMUSCULAR | Status: AC
  Administered 2015-06-20: 200 mg via INTRAMUSCULAR

## 2015-06-20 MED ORDER — COLCHICINE 0.6 MG PO TABS: 0.6000 mg | ORAL_TABLET | Freq: Every day | ORAL | Status: DC

## 2015-06-20 NOTE — Telephone Encounter (Signed)
Pt requesting colchine for current gout flare. He states he has allupurinol but it out of colchicine. Ok to fill?  [facility] [place]. I have changed this

## 2015-06-23 ENCOUNTER — Other Ambulatory Visit: Payer: Self-pay | Admitting: Internal Medicine

## 2015-07-03 ENCOUNTER — Telehealth: Payer: Self-pay | Admitting: Internal Medicine

## 2015-07-03 ENCOUNTER — Telehealth: Payer: Self-pay

## 2015-07-03 ENCOUNTER — Ambulatory Visit (INDEPENDENT_AMBULATORY_CARE_PROVIDER_SITE_OTHER): Payer: Medicare Other

## 2015-07-03 DIAGNOSIS — E291 Testicular hypofunction: Secondary | ICD-10-CM

## 2015-07-03 MED ORDER — TESTOSTERONE CYPIONATE 200 MG/ML IM SOLN
200.0000 mg | Freq: Once | INTRAMUSCULAR | Status: AC
  Administered 2015-07-03: 200 mg via INTRAMUSCULAR

## 2015-07-03 MED ORDER — NEEDLE (DISP) 23G X 1" MISC
Status: DC
Start: 2015-07-03 — End: 2015-07-08

## 2015-07-03 MED ORDER — NEEDLE (DISP) 18G X 1" MISC
Status: DC
Start: 2015-07-03 — End: 2015-07-08

## 2015-07-03 MED ORDER — NEEDLES & SYRINGES MISC
Status: DC
Start: 2015-07-03 — End: 2015-07-08

## 2015-07-03 NOTE — Telephone Encounter (Signed)
Pt requesting needles and syringes be sent to pharmacy for testosterone injections, is going to try to self administer at home to cut down on cost for injection at office. Will send to pharmacy on file

## 2015-07-03 NOTE — Telephone Encounter (Signed)
Pt would like to transfer from to [facility] from [place].  It this ok?

## 2015-07-03 NOTE — Addendum Note (Signed)
Addended by: Donnita Falls on: 07/03/2015 04:51 PM   Modules accepted: Orders

## 2015-07-07 NOTE — Telephone Encounter (Signed)
yes

## 2015-07-08 MED ORDER — NEEDLES & SYRINGES MISC: Status: DC

## 2015-07-08 MED ORDER — "NEEDLE (DISP) 18G X 1"" MISC": Status: DC

## 2015-07-08 MED ORDER — "NEEDLE (DISP) 23G X 1"" MISC": Status: DC

## 2015-07-08 NOTE — Addendum Note (Signed)
Addended by: Donnita Falls on: 07/08/2015 01:37 PM   Modules accepted: Orders

## 2015-07-30 ENCOUNTER — Ambulatory Visit (INDEPENDENT_AMBULATORY_CARE_PROVIDER_SITE_OTHER): Payer: Medicare Other

## 2015-07-30 DIAGNOSIS — E291 Testicular hypofunction: Secondary | ICD-10-CM | POA: Diagnosis not present

## 2015-07-30 MED ORDER — TESTOSTERONE CYPIONATE 200 MG/ML IM SOLN
200.0000 mg | Freq: Once | INTRAMUSCULAR | Status: AC
  Administered 2015-07-30: 200 mg via INTRAMUSCULAR

## 2015-08-09 DIAGNOSIS — N5201 Erectile dysfunction due to arterial insufficiency: Secondary | ICD-10-CM | POA: Diagnosis not present

## 2015-08-09 DIAGNOSIS — N401 Enlarged prostate with lower urinary tract symptoms: Secondary | ICD-10-CM | POA: Diagnosis not present

## 2015-08-09 DIAGNOSIS — R350 Frequency of micturition: Secondary | ICD-10-CM | POA: Diagnosis not present

## 2015-08-09 DIAGNOSIS — Z0000 Encounter for general adult medical examination without abnormal findings: Secondary | ICD-10-CM | POA: Diagnosis not present

## 2015-08-09 DIAGNOSIS — R351 Nocturia: Secondary | ICD-10-CM | POA: Diagnosis not present

## 2015-08-09 DIAGNOSIS — E895 Postprocedural testicular hypofunction: Secondary | ICD-10-CM | POA: Diagnosis not present

## 2015-09-03 DIAGNOSIS — H5203 Hypermetropia, bilateral: Secondary | ICD-10-CM | POA: Diagnosis not present

## 2015-09-03 DIAGNOSIS — E119 Type 2 diabetes mellitus without complications: Secondary | ICD-10-CM | POA: Diagnosis not present

## 2015-09-03 DIAGNOSIS — H52223 Regular astigmatism, bilateral: Secondary | ICD-10-CM | POA: Diagnosis not present

## 2015-09-03 DIAGNOSIS — I10 Essential (primary) hypertension: Secondary | ICD-10-CM | POA: Diagnosis not present

## 2015-09-03 DIAGNOSIS — D869 Sarcoidosis, unspecified: Secondary | ICD-10-CM | POA: Diagnosis not present

## 2015-09-03 DIAGNOSIS — H524 Presbyopia: Secondary | ICD-10-CM | POA: Diagnosis not present

## 2015-09-03 DIAGNOSIS — H11153 Pinguecula, bilateral: Secondary | ICD-10-CM | POA: Diagnosis not present

## 2015-09-03 LAB — HM DIABETES EYE EXAM

## 2015-09-23 ENCOUNTER — Encounter: Payer: Self-pay | Admitting: Internal Medicine

## 2015-09-30 DIAGNOSIS — M109 Gout, unspecified: Secondary | ICD-10-CM | POA: Diagnosis not present

## 2015-09-30 DIAGNOSIS — E119 Type 2 diabetes mellitus without complications: Secondary | ICD-10-CM | POA: Diagnosis not present

## 2015-09-30 DIAGNOSIS — I158 Other secondary hypertension: Secondary | ICD-10-CM | POA: Diagnosis not present

## 2015-09-30 DIAGNOSIS — D860 Sarcoidosis of lung: Secondary | ICD-10-CM | POA: Diagnosis not present

## 2015-09-30 DIAGNOSIS — E784 Other hyperlipidemia: Secondary | ICD-10-CM | POA: Diagnosis not present

## 2015-09-30 LAB — HEPATIC FUNCTION PANEL
ALT: 25 U/L (ref 10–40)
AST: 21 U/L (ref 14–40)
Alkaline Phosphatase: 64 U/L (ref 25–125)
Bilirubin, Total: 0.5 mg/dL

## 2015-09-30 LAB — CBC AND DIFFERENTIAL
HCT: 47 % (ref 41–53)
Hemoglobin: 16 g/dL (ref 13.5–17.5)
Platelets: 265 10*3/uL (ref 150–399)
WBC: 4.9 10^3/mL

## 2015-09-30 LAB — BASIC METABOLIC PANEL
BUN: 9 mg/dL (ref 4–21)
Creatinine: 0.9 mg/dL (ref 0.6–1.3)
Glucose: 176 mg/dL
Potassium: 3.7 mmol/L (ref 3.4–5.3)
Sodium: 139 mmol/L (ref 137–147)

## 2015-10-15 ENCOUNTER — Other Ambulatory Visit: Payer: Self-pay | Admitting: Family

## 2015-10-15 ENCOUNTER — Telehealth: Payer: Self-pay | Admitting: *Deleted

## 2015-10-15 ENCOUNTER — Encounter: Payer: Self-pay | Admitting: Family

## 2015-10-15 ENCOUNTER — Other Ambulatory Visit (INDEPENDENT_AMBULATORY_CARE_PROVIDER_SITE_OTHER): Payer: Medicare Other

## 2015-10-15 DIAGNOSIS — Z5181 Encounter for therapeutic drug level monitoring: Secondary | ICD-10-CM

## 2015-10-15 DIAGNOSIS — E291 Testicular hypofunction: Secondary | ICD-10-CM | POA: Diagnosis not present

## 2015-10-15 DIAGNOSIS — R7989 Other specified abnormal findings of blood chemistry: Secondary | ICD-10-CM

## 2015-10-15 LAB — CBC
HCT: 43.1 % (ref 39.0–52.0)
Hemoglobin: 14.5 g/dL (ref 13.0–17.0)
MCHC: 33.7 g/dL (ref 30.0–36.0)
MCV: 79.5 fl (ref 78.0–100.0)
Platelets: 239 10*3/uL (ref 150.0–400.0)
RBC: 5.42 Mil/uL (ref 4.22–5.81)
RDW: 14 % (ref 11.5–15.5)
WBC: 5.3 10*3/uL (ref 4.0–10.5)

## 2015-10-15 MED ORDER — TESTOSTERONE CYPIONATE 200 MG/ML IM SOLN: 200.0000 mg | INTRAMUSCULAR | Status: DC

## 2015-10-15 NOTE — Telephone Encounter (Signed)
Received call pt requesting refill on his Testosterone. He has appt w/Dr. Yetta Barre on 11/06/15 currently is out of med...Raechel Chute

## 2015-10-15 NOTE — Telephone Encounter (Signed)
Notified pt w/Greg response.../lmb 

## 2015-10-15 NOTE — Telephone Encounter (Signed)
Please have him stop by the lab for a CBC. Once I get those results I will refill it.

## 2015-10-16 ENCOUNTER — Telehealth: Payer: Self-pay

## 2015-10-16 NOTE — Telephone Encounter (Signed)
APPROVED through 10/15/2016

## 2015-10-16 NOTE — Telephone Encounter (Signed)
PA initiated via CoverMyMeds key [id number]

## 2015-10-17 ENCOUNTER — Other Ambulatory Visit: Payer: Self-pay

## 2015-10-17 MED ORDER — HYDROCHLOROTHIAZIDE 25 MG PO TABS: 25.0000 mg | ORAL_TABLET | Freq: Every day | ORAL | Status: DC

## 2015-10-18 ENCOUNTER — Other Ambulatory Visit: Payer: Self-pay

## 2015-10-18 MED ORDER — FUROSEMIDE 40 MG PO TABS: 40.0000 mg | ORAL_TABLET | Freq: Every day | ORAL | Status: DC

## 2015-10-18 MED ORDER — HYDROCHLOROTHIAZIDE 25 MG PO TABS: 25.0000 mg | ORAL_TABLET | Freq: Every day | ORAL | Status: DC

## 2015-11-05 ENCOUNTER — Other Ambulatory Visit: Payer: Self-pay | Admitting: *Deleted

## 2015-11-05 ENCOUNTER — Other Ambulatory Visit: Payer: Self-pay | Admitting: Internal Medicine

## 2015-11-05 MED ORDER — ALLOPURINOL 300 MG PO TABS: 300.0000 mg | ORAL_TABLET | Freq: Every day | ORAL | Status: DC

## 2015-11-06 ENCOUNTER — Encounter: Payer: Self-pay | Admitting: Internal Medicine

## 2015-11-06 ENCOUNTER — Ambulatory Visit (INDEPENDENT_AMBULATORY_CARE_PROVIDER_SITE_OTHER): Payer: Medicare Other | Admitting: Internal Medicine

## 2015-11-06 ENCOUNTER — Other Ambulatory Visit (INDEPENDENT_AMBULATORY_CARE_PROVIDER_SITE_OTHER): Payer: Medicare Other

## 2015-11-06 VITALS — BP 132/80 | HR 80 | Temp 98.3°F | Resp 16 | Ht 75.0 in | Wt 270.0 lb

## 2015-11-06 DIAGNOSIS — D869 Sarcoidosis, unspecified: Secondary | ICD-10-CM | POA: Diagnosis not present

## 2015-11-06 DIAGNOSIS — E119 Type 2 diabetes mellitus without complications: Secondary | ICD-10-CM

## 2015-11-06 DIAGNOSIS — E669 Obesity, unspecified: Secondary | ICD-10-CM | POA: Diagnosis not present

## 2015-11-06 DIAGNOSIS — I10 Essential (primary) hypertension: Secondary | ICD-10-CM

## 2015-11-06 DIAGNOSIS — E1169 Type 2 diabetes mellitus with other specified complication: Secondary | ICD-10-CM

## 2015-11-06 DIAGNOSIS — E876 Hypokalemia: Secondary | ICD-10-CM | POA: Insufficient documentation

## 2015-11-06 DIAGNOSIS — J45990 Exercise induced bronchospasm: Secondary | ICD-10-CM

## 2015-11-06 DIAGNOSIS — E785 Hyperlipidemia, unspecified: Secondary | ICD-10-CM

## 2015-11-06 DIAGNOSIS — Z23 Encounter for immunization: Secondary | ICD-10-CM | POA: Diagnosis not present

## 2015-11-06 DIAGNOSIS — N522 Drug-induced erectile dysfunction: Secondary | ICD-10-CM | POA: Diagnosis not present

## 2015-11-06 DIAGNOSIS — J069 Acute upper respiratory infection, unspecified: Secondary | ICD-10-CM

## 2015-11-06 LAB — COMPREHENSIVE METABOLIC PANEL
ALT: 24 U/L (ref 0–53)
AST: 18 U/L (ref 0–37)
Albumin: 4.2 g/dL (ref 3.5–5.2)
Alkaline Phosphatase: 64 U/L (ref 39–117)
BUN: 8 mg/dL (ref 6–23)
CO2: 28 mEq/L (ref 19–32)
Calcium: 9.2 mg/dL (ref 8.4–10.5)
Chloride: 97 mEq/L (ref 96–112)
Creatinine, Ser: 0.79 mg/dL (ref 0.40–1.50)
GFR: 137.73 mL/min (ref >60.00)
Glucose, Bld: 183 mg/dL — ABNORMAL HIGH (ref 70–99)
Potassium: 3.3 mEq/L — ABNORMAL LOW (ref 3.5–5.1)
Sodium: 135 mEq/L (ref 135–145)
Total Bilirubin: 0.4 mg/dL (ref 0.2–1.2)
Total Protein: 7.3 g/dL (ref 6.0–8.3)

## 2015-11-06 LAB — HEMOGLOBIN A1C: Hgb A1c MFr Bld: 7.2 % — ABNORMAL HIGH (ref 4.6–6.5)

## 2015-11-06 MED ORDER — POTASSIUM CHLORIDE ER 10 MEQ PO TBCR: 10.0000 meq | EXTENDED_RELEASE_TABLET | Freq: Two times a day (BID) | ORAL | Status: DC

## 2015-11-06 MED ORDER — TADALAFIL 20 MG PO TABS: 10.0000 mg | ORAL_TABLET | ORAL | Status: DC | PRN

## 2015-11-06 MED ORDER — ALBUTEROL SULFATE HFA 108 (90 BASE) MCG/ACT IN AERS: 2.0000 | INHALATION_SPRAY | Freq: Four times a day (QID) | RESPIRATORY_TRACT | Status: DC | PRN

## 2015-11-06 MED ORDER — HYDROCHLOROTHIAZIDE 25 MG PO TABS: 25.0000 mg | ORAL_TABLET | Freq: Every day | ORAL | Status: DC

## 2015-11-06 NOTE — Progress Notes (Signed)
Subjective:  Patient ID: [name], male    DOB: June 12, 1970  Age: 45 y.o. MRN: [id number]  CC: Hypertension and Diabetes  NEW TO ME  HPI [name] presents for Follow-up on sarcoid, hypertension, and diabetes. He feels well and offers no complaints. He denies cough, shortness of breath, wheezing, polyuria, polydipsia, or polyphagia. His blood pressures been well controlled with a combination of metoprolol and 2 diuretics are currently listed on his medication list. He denies recent headache, blurred vision, chest pain, shortness of breath, muscle aches or weakness, palpitations, dyspnea on exertion, or edema.  History [name] has a past medical history of PULMONARY SARCOIDOSIS; DIABETES MELLITUS, TYPE II; TESTICULAR HYPOFUNCTION; HYPERLIPIDEMIA; Gout, unspecified; POLYNEUROPATHY; HYPERTENSION; CORONARY ARTERY DISEASE; GERD; ERECTILE DYSFUNCTION, ORGANIC; Edema; TIA (transient ischemic attack); Arthritis; Narcolepsy without cataplexy; and Pericarditis.   He has past surgical history that includes Bronchoscopy (08/21/08); Mediastinoscopy (11/30/08); Cardiac catheterization (06/25/2009); and left heart catheterization with coronary angiogram (N/A, 05/18/2014).   His family history includes Diabetes in his father and mother; Heart disease in his father; Heart failure in his father; Hypertension in his mother.He reports that he quit smoking about 7 years ago. His smoking use included Cigarettes. He quit after 15 years of use. He does not have any smokeless tobacco history on file. He reports that he drinks alcohol. He reports that he does not use illicit drugs.  Outpatient Prescriptions Prior to Visit  Medication Sig Dispense Refill  . aspirin EC 81 MG tablet Take 1 tablet (81 mg total) by mouth daily. 30 tablet 3  . atorvastatin (LIPITOR) 10 MG tablet Take 1 tablet (10 mg total) by mouth daily. 90 tablet 3  . budesonide (PULMICORT) 0.25 MG/2ML nebulizer solution Take 2 mLs (0.25 mg  total) by nebulization 2 (two) times daily. DX: J44.0, D86.9, J06.9 60 mL 11  . colchicine (COLCRYS) 0.6 MG tablet Take 1 tablet (0.6 mg total) by mouth daily. 30 tablet 2  . glucose blood (ONE TOUCH ULTRA TEST) test strip 1 each by Other route daily. Use as instructed 100 each 1  . levalbuterol (XOPENEX) 0.63 MG/3ML nebulizer solution Take 3 mLs (0.63 mg total) by nebulization every 6 (six) hours as needed. DX: D86.9 and DX: J06.9 120 mL 5  . metFORMIN (GLUCOPHAGE-XR) 500 MG 24 hr tablet Take 1 tablet (500 mg total) by mouth 2 (two) times daily with a meal. 180 tablet 1  . metoprolol (LOPRESSOR) 50 MG tablet Take 1 tablet (50 mg total) by mouth 2 (two) times daily. 180 tablet 0  . NEEDLE, DISP, 18 G 18G X 1" MISC Use to inject testosterone every 14 days 25 each 0  . NEEDLE, DISP, 23 G 23G X 1" MISC Use to inject testosterone every 14 days 25 each 0  . Needles & Syringes MISC Use to inject testosterone every 14 days. 10 each 3  . potassium chloride SA (KLOR-CON M20) 20 MEQ tablet Take 1 tablet (20 mEq total) by mouth 2 (two) times daily. 180 tablet 2  . saw palmetto 160 MG capsule Take 160 mg by mouth daily.    Marland Kitchen testosterone cypionate (DEPOTESTOSTERONE CYPIONATE) 200 MG/ML injection Inject 1 mL (200 mg total) into the muscle every 14 (fourteen) days. 10 mL 0  . albuterol (PROVENTIL HFA;VENTOLIN HFA) 108 (90 BASE) MCG/ACT inhaler Inhale 2 puffs into the lungs every 6 (six) hours as needed for wheezing or shortness of breath. 1 Inhaler 0  . allopurinol (ZYLOPRIM) 300 MG tablet Take 1 tablet (300  mg total) by mouth daily. 30 tablet 0  . furosemide (LASIX) 40 MG tablet Take 1 tablet (40 mg total) by mouth daily. 30 tablet 0  . hydrochlorothiazide (HYDRODIURIL) 25 MG tablet Take 1 tablet (25 mg total) by mouth daily. 30 tablet 0  . metoprolol (LOPRESSOR) 50 MG tablet Take 1 tablet (50 mg total) by mouth 2 (two) times daily. 180 tablet 1  . tadalafil (CIALIS) 20 MG tablet Take 0.5-1 tablets (10-20 mg  total) by mouth every 3 (three) days as needed for erectile dysfunction. 10 tablet 1  . nitroGLYCERIN (NITROSTAT) 0.4 MG SL tablet Place 1 tablet (0.4 mg total) under the tongue every 5 (five) minutes as needed for chest pain. (Patient not taking: Reported on 11/06/2015) 90 tablet 3  . phentermine 37.5 MG capsule Take 1 capsule (37.5 mg total) by mouth daily. (Patient not taking: Reported on 11/06/2015) 30 capsule 0   No facility-administered medications prior to visit.    ROS Review of Systems  Constitutional: Negative.  Negative for fever, chills, diaphoresis, appetite change and fatigue.  HENT: Negative.   Eyes: Negative.  Negative for visual disturbance.  Respiratory: Negative.  Negative for cough, choking, chest tightness, shortness of breath and stridor.   Cardiovascular: Negative.  Negative for chest pain, palpitations and leg swelling.  Gastrointestinal: Negative.  Negative for nausea, vomiting, abdominal pain, diarrhea, constipation and blood in stool.  Endocrine: Negative.   Genitourinary: Negative.  Negative for difficulty urinating.  Musculoskeletal: Negative.  Negative for myalgias, back pain, arthralgias and neck pain.  Skin: Negative.  Negative for color change and rash.  Allergic/Immunologic: Negative.   Neurological: Negative.  Negative for dizziness, tremors, syncope, light-headedness, numbness and headaches.  Hematological: Negative.  Negative for adenopathy. Does not bruise/bleed easily.  Psychiatric/Behavioral: Negative.     Objective:  BP 132/80 mmHg  Pulse 80  Temp(Src) 98.3 F (36.8 C) (Oral)  Resp 16  Ht 6\' 3"  (1.905 m)  Wt 270 lb (122.471 kg)  BMI 33.75 kg/m2  SpO2 97%  Physical Exam  Constitutional: He is oriented to person, place, and time. No distress.  HENT:  Mouth/Throat: Oropharynx is clear and moist. No oropharyngeal exudate.  Eyes: Conjunctivae are normal. Right eye exhibits no discharge. Left eye exhibits no discharge. No scleral icterus.    Neck: Normal range of motion. Neck supple. No JVD present. No tracheal deviation present. No thyromegaly present.  Cardiovascular: Normal rate, regular rhythm, normal heart sounds and intact distal pulses.  Exam reveals no gallop and no friction rub.   No murmur heard. Pulmonary/Chest: Effort normal and breath sounds normal. No stridor. No respiratory distress. He has no wheezes. He has no rales. He exhibits no tenderness.  Abdominal: Soft. Bowel sounds are normal. He exhibits no distension and no mass. There is no tenderness. There is no rebound and no guarding.  Musculoskeletal: Normal range of motion. He exhibits no edema or tenderness.  Lymphadenopathy:    He has no cervical adenopathy.  Neurological: He is oriented to person, place, and time.  Skin: Skin is warm and dry. No rash noted. He is not diaphoretic. No erythema. No pallor.  Vitals reviewed.   Lab Results  Component Value Date   WBC 5.3 10/15/2015   HGB 14.5 10/15/2015   HCT 43.1 10/15/2015   PLT 239.0 10/15/2015   GLUCOSE 183* 11/06/2015   CHOL 191 04/29/2015   TRIG 120.0 04/29/2015   HDL 45.00 04/29/2015   LDLCALC 122* 04/29/2015   ALT 24 11/06/2015  AST 18 11/06/2015   NA 135 11/06/2015   K 3.3* 11/06/2015   CL 97 11/06/2015   CREATININE 0.79 11/06/2015   BUN 8 11/06/2015   CO2 28 11/06/2015   TSH 0.41 11/22/2013   INR 1.0 05/14/2014   HGBA1C 7.2* 11/06/2015   MICROALBUR 1.4 04/29/2015    Assessment & Plan:   Randall was seen today for hypertension and diabetes.  Diagnoses and all orders for this visit:  Acute upper respiratory infection  Essential hypertension- his blood pressures adequately well controlled, I don't think you should take 2 different diuretics have asked him to stop the loop diuretic. Will start potassium replacement therapy to treat the low potassium level. -     hydrochlorothiazide (HYDRODIURIL) 25 MG tablet; Take 1 tablet (25 mg total) by mouth daily. -     Comprehensive metabolic  panel; Future -     potassium chloride (K-DUR) 10 MEQ tablet; Take 1 tablet (10 mEq total) by mouth 2 (two) times daily.  Diabetes mellitus type 2 in obese [facility])- his blood sugars are adequately well-controlled, he will continue the current medical regimen. -     Comprehensive metabolic panel; Future -     Hemoglobin A1c; Future  PULMONARY SARCOIDOSIS  Sarcoidosis (HCC)- he has no signs or symptoms related to this and his calcium level is normal. He will continue to follow-up with his rheumatologist at [facility].  Drug-induced erectile dysfunction -     tadalafil (CIALIS) 20 MG tablet; Take 0.5-1 tablets (10-20 mg total) by mouth every 3 (three) days as needed for erectile dysfunction.  Exercise-induced asthma -     albuterol (PROVENTIL HFA;VENTOLIN HFA) 108 (90 Base) MCG/ACT inhaler; Inhale 2 puffs into the lungs every 6 (six) hours as needed for wheezing or shortness of breath.  Hyperlipidemia with target LDL less than 70  Need for prophylactic vaccination against Streptococcus pneumoniae (pneumococcus) -     Pneumococcal polysaccharide vaccine 23-valent greater than or equal to 2yo subcutaneous/IM  Hypokalemia- He has listed 2 different diuretics 1 a thiazide and 1 a loop diuretic, I think this combination has caused the hypokalemia, I've asked him to stop taking the loop diuretic but will continue hydrochlorothiazide for blood pressure control, will also start potassium replacement therapy to treat the low potassium level. -     potassium chloride (K-DUR) 10 MEQ tablet; Take 1 tablet (10 mEq total) by mouth 2 (two) times daily.   I have discontinued Mr. Dowding phentermine and furosemide. I am also having him start on potassium chloride. Additionally, I am having him maintain his saw palmetto, glucose blood, nitroGLYCERIN, metFORMIN, potassium chloride SA, aspirin EC, atorvastatin, levalbuterol, budesonide, colchicine, metoprolol, NEEDLE (DISP) 18 G, NEEDLE (DISP) 23 G, Needles &  Syringes, testosterone cypionate, levETIRAcetam, albuterol, hydrochlorothiazide, and tadalafil.  Meds ordered this encounter  Medications  . levETIRAcetam (KEPPRA) 500 MG tablet    Sig: Take 1,000 mg by mouth.  Marland Kitchen albuterol (PROVENTIL HFA;VENTOLIN HFA) 108 (90 Base) MCG/ACT inhaler    Sig: Inhale 2 puffs into the lungs every 6 (six) hours as needed for wheezing or shortness of breath.    Dispense:  1 Inhaler    Refill:  11  . hydrochlorothiazide (HYDRODIURIL) 25 MG tablet    Sig: Take 1 tablet (25 mg total) by mouth daily.    Dispense:  30 tablet    Refill:  5  . tadalafil (CIALIS) 20 MG tablet    Sig: Take 0.5-1 tablets (10-20 mg total) by mouth every 3 (three)  days as needed for erectile dysfunction.    Dispense:  10 tablet    Refill:  11  . potassium chloride (K-DUR) 10 MEQ tablet    Sig: Take 1 tablet (10 mEq total) by mouth 2 (two) times daily.    Dispense:  60 tablet    Refill:  5     Follow-up: Return in about 6 months (around 05/07/2016).  Sanda Linger, MD

## 2015-11-06 NOTE — Patient Instructions (Signed)

## 2015-11-06 NOTE — Progress Notes (Signed)
Pre visit review using our clinic review tool, if applicable. No additional management support is needed unless otherwise documented below in the visit note. 

## 2015-11-16 ENCOUNTER — Other Ambulatory Visit: Payer: Self-pay | Admitting: Internal Medicine

## 2015-11-25 ENCOUNTER — Ambulatory Visit (INDEPENDENT_AMBULATORY_CARE_PROVIDER_SITE_OTHER): Payer: Medicare Other

## 2015-11-25 DIAGNOSIS — E291 Testicular hypofunction: Secondary | ICD-10-CM

## 2015-11-25 MED ORDER — TESTOSTERONE CYPIONATE 200 MG/ML IM SOLN
200.0000 mg | Freq: Once | INTRAMUSCULAR | Status: AC
  Administered 2015-11-25: 200 mg via INTRAMUSCULAR

## 2015-12-12 ENCOUNTER — Ambulatory Visit (INDEPENDENT_AMBULATORY_CARE_PROVIDER_SITE_OTHER): Payer: Medicare Other

## 2015-12-12 DIAGNOSIS — E291 Testicular hypofunction: Secondary | ICD-10-CM | POA: Diagnosis not present

## 2015-12-12 MED ORDER — TESTOSTERONE CYPIONATE 200 MG/ML IM SOLN: 200.0000 mg | INTRAMUSCULAR | Status: DC

## 2015-12-12 MED ORDER — TESTOSTERONE CYPIONATE 200 MG/ML IM SOLN
200.0000 mg | Freq: Once | INTRAMUSCULAR | Status: AC
  Administered 2015-12-12: 200 mg via INTRAMUSCULAR

## 2015-12-19 ENCOUNTER — Telehealth: Payer: Self-pay

## 2015-12-19 NOTE — Telephone Encounter (Signed)
rx faxed to [facility].  

## 2015-12-27 ENCOUNTER — Ambulatory Visit (INDEPENDENT_AMBULATORY_CARE_PROVIDER_SITE_OTHER): Payer: Medicare Other | Admitting: Geriatric Medicine

## 2015-12-27 DIAGNOSIS — E291 Testicular hypofunction: Secondary | ICD-10-CM

## 2015-12-27 MED ORDER — TESTOSTERONE CYPIONATE 200 MG/ML IM SOLN
200.0000 mg | INTRAMUSCULAR | Status: DC
  Administered 2015-12-27 – 2016-04-23 (×2): 200 mg via INTRAMUSCULAR

## 2015-12-31 ENCOUNTER — Ambulatory Visit (INDEPENDENT_AMBULATORY_CARE_PROVIDER_SITE_OTHER): Payer: Medicare Other

## 2015-12-31 ENCOUNTER — Encounter: Payer: Self-pay | Admitting: Podiatry

## 2015-12-31 ENCOUNTER — Ambulatory Visit (INDEPENDENT_AMBULATORY_CARE_PROVIDER_SITE_OTHER): Payer: Medicare Other | Admitting: Podiatry

## 2015-12-31 VITALS — BP 124/85 | HR 86 | Resp 16

## 2015-12-31 DIAGNOSIS — E119 Type 2 diabetes mellitus without complications: Secondary | ICD-10-CM

## 2015-12-31 DIAGNOSIS — E1142 Type 2 diabetes mellitus with diabetic polyneuropathy: Secondary | ICD-10-CM

## 2015-12-31 DIAGNOSIS — M6740 Ganglion, unspecified site: Secondary | ICD-10-CM

## 2015-12-31 NOTE — Progress Notes (Signed)
   Subjective:    Patient ID: [name], male    DOB: April 21, 1971, 45 y.o.   MRN: [id number]  HPI: He presents today with a chief complaint of what appears to be a cyst to the third toe of the left foot. He states that it has been present for the past few months is really not painful but it has changed in size and he is concerned because he is diabetic. His hemoglobin A1c is 7.2. He does relate numbness and tingling to his toes on occasion as well as his fingers. He is a Marine scientist.    Review of Systems  All other systems reviewed and are negative.      Objective:   Physical Exam: I have reviewed his past mental history medications allergies surgeries and social history. He is in no apparent distress. Pulses are strong and palpable bilateral. Neurologic sensorium is intact. Deep tendon reflexes are intact. Muscle strength +5 over 5 dorsiflexion plantar flexors and inverters everters all intrinsic musculature is intact. Orthopedic evaluation demonstrates all joints distal to the ankle for range of motion without crepitation. He does have mild flexible hammertoe deformities of third toenail left foot appears to be a little more rigid than that of the right radiographs taken today 3 views of bilateral foot demonstrates relatively normal osseous architecture with exception of hammertoe deformities and some osteoarthritic changes to the DIPJ third digit of the left foot. Cutaneous evaluation demonstrates a 0.7 cm nonpulsatile lesion overlying the DIPJ third digit left foot. I lanced the lesion today after sterile Betadine skin prep and mucoid material was exsanguinated. No signs of infected tissue.        Assessment & Plan:  Mucoid cyst diabetes mellitus with diabetic peripheral neuropathy third digit left foot.  Plan: Aspiration of the lesion today after sterile Betadine skin prep. Small amount of Silvadene cream and address her compressive dressing was applied and instructed him on how  to compress the lesion for the next few days. Follow up with him on an as-needed basis for either aspiration or surgical fix.

## 2016-01-10 ENCOUNTER — Ambulatory Visit (INDEPENDENT_AMBULATORY_CARE_PROVIDER_SITE_OTHER): Payer: Medicare Other | Admitting: Family

## 2016-01-10 ENCOUNTER — Encounter: Payer: Self-pay | Admitting: Family

## 2016-01-10 DIAGNOSIS — R0981 Nasal congestion: Secondary | ICD-10-CM

## 2016-01-10 MED ORDER — FLUTICASONE FUROATE-VILANTEROL 100-25 MCG/INH IN AEPB: 1.0000 | INHALATION_SPRAY | Freq: Every day | RESPIRATORY_TRACT | 0 refills | Status: DC

## 2016-01-10 NOTE — Progress Notes (Signed)
Subjective:    Patient ID: [name], male    DOB: [birth year], 45 y.o.   MRN: [id number]  Chief Complaint  Patient presents with  . Shortness of Breath    SOB, some congestion, had a cold last week and he still has the SOB, no cough    HPI:  [name] is a 45 y.o. male who  has a past medical history of Arthritis; CORONARY ARTERY DISEASE; DIABETES MELLITUS, TYPE II; Edema; ERECTILE DYSFUNCTION, ORGANIC; GERD; Gout, unspecified; HYPERLIPIDEMIA; HYPERTENSION; Narcolepsy without cataplexy; Pericarditis; POLYNEUROPATHY; PULMONARY SARCOIDOSIS; TESTICULAR HYPOFUNCTION; and TIA (transient ischemic attack). and presents today for an acute office visit.     This is a new problem. Associated symptom congestion and occasional shortness of breath has been going on for about 1 week. Notes it started out with cold symptoms which have resolved but the congestion and shortness of breath remain. Modifying factors include OTC medications which have helped modify the symptoms. He has had to use his rescue inhaler more often.   Allergies  Allergen Reactions  . Sulfonamide Derivatives Rash and Other (See Comments)     Current Outpatient Prescriptions on File Prior to Visit  Medication Sig Dispense Refill  . albuterol (PROVENTIL HFA;VENTOLIN HFA) 108 (90 Base) MCG/ACT inhaler Inhale 2 puffs into the lungs every 6 (six) hours as needed for wheezing or shortness of breath. 1 Inhaler 11  . allopurinol (ZYLOPRIM) 300 MG tablet Take 1 tablet (300 mg total) by mouth daily. 90 tablet 3  . aspirin EC 81 MG tablet Take 1 tablet (81 mg total) by mouth daily. 30 tablet 3  . atorvastatin (LIPITOR) 10 MG tablet Take 1 tablet (10 mg total) by mouth daily. 90 tablet 3  . budesonide (PULMICORT) 0.25 MG/2ML nebulizer solution Take 2 mLs (0.25 mg total) by nebulization 2 (two) times daily. DX: J44.0, D86.9, J06.9 60 mL 11  . colchicine (COLCRYS) 0.6 MG tablet Take 1 tablet (0.6 mg total) by mouth daily. 30  tablet 2  . furosemide (LASIX) 40 MG tablet TAKE 1 TABLET BY MOUTH DAILY 90 tablet 3  . glucose blood (ONE TOUCH ULTRA TEST) test strip 1 each by Other route daily. Use as instructed 100 each 1  . hydrochlorothiazide (HYDRODIURIL) 25 MG tablet Take 1 tablet (25 mg total) by mouth daily. 30 tablet 5  . levalbuterol (XOPENEX) 0.63 MG/3ML nebulizer solution Take 3 mLs (0.63 mg total) by nebulization every 6 (six) hours as needed. DX: D86.9 and DX: J06.9 120 mL 5  . levETIRAcetam (KEPPRA) 500 MG tablet Take 1,000 mg by mouth.    . metFORMIN (GLUCOPHAGE-XR) 500 MG 24 hr tablet Take 1 tablet (500 mg total) by mouth 2 (two) times daily with a meal. 180 tablet 1  . metoprolol (LOPRESSOR) 50 MG tablet Take 1 tablet (50 mg total) by mouth 2 (two) times daily. 180 tablet 0  . NEEDLE, DISP, 18 G 18G X 1" MISC Use to inject testosterone every 14 days 25 each 0  . NEEDLE, DISP, 23 G 23G X 1" MISC Use to inject testosterone every 14 days 25 each 0  . Needles & Syringes MISC Use to inject testosterone every 14 days. 10 each 3  . nitroGLYCERIN (NITROSTAT) 0.4 MG SL tablet Place 1 tablet (0.4 mg total) under the tongue every 5 (five) minutes as needed for chest pain. 90 tablet 3  . potassium chloride (K-DUR) 10 MEQ tablet Take 1 tablet (10 mEq total) by mouth 2 (two) times daily.  60 tablet 5  . potassium chloride SA (KLOR-CON M20) 20 MEQ tablet Take 1 tablet (20 mEq total) by mouth 2 (two) times daily. 180 tablet 2  . saw palmetto 160 MG capsule Take 160 mg by mouth daily.    . tadalafil (CIALIS) 20 MG tablet Take 0.5-1 tablets (10-20 mg total) by mouth every 3 (three) days as needed for erectile dysfunction. 10 tablet 11  . testosterone cypionate (DEPOTESTOSTERONE CYPIONATE) 200 MG/ML injection Inject 1 mL (200 mg total) into the muscle every 14 (fourteen) days. 10 mL 0  . zolpidem (AMBIEN) 5 MG tablet Take 5 mg by mouth.     Current Facility-Administered Medications on File Prior to Visit  Medication Dose Route  Frequency Provider Last Rate Last Dose  . testosterone cypionate (DEPOTESTOSTERONE CYPIONATE) injection 200 mg  200 mg Intramuscular Q14 Days Tresa Garter, MD   200 mg at 12/27/15 1548      Review of Systems  Constitutional: Negative for chills, diaphoresis, fever and unexpected weight change.  HENT: Positive for congestion. Negative for ear pain, rhinorrhea, sinus pressure, sneezing and sore throat.   Respiratory: Positive for shortness of breath. Negative for chest tightness and wheezing.   Neurological: Negative for headaches.      Objective:    BP 132/90 (BP Location: Left Arm, Patient Position: Sitting, Cuff Size: Large)   Pulse 76   Temp 98.1 F (36.7 C) (Oral)   Resp 18   Ht 6\' 3"  (1.905 m)   Wt 276 lb (125.2 kg)   SpO2 97%   BMI 34.50 kg/m  Nursing note and vital signs reviewed.  Physical Exam  Constitutional: He is oriented to person, place, and time. He appears well-developed and well-nourished. No distress.  HENT:  Right Ear: Hearing, tympanic membrane, external ear and ear canal normal.  Left Ear: Hearing, tympanic membrane, external ear and ear canal normal.  Nose: Right sinus exhibits no maxillary sinus tenderness and no frontal sinus tenderness. Left sinus exhibits no maxillary sinus tenderness and no frontal sinus tenderness.  Mouth/Throat: Uvula is midline, oropharynx is clear and moist and mucous membranes are normal.  Cardiovascular: Normal rate, regular rhythm, normal heart sounds and intact distal pulses.   Pulmonary/Chest: Effort normal and breath sounds normal. No respiratory distress. He has no wheezes. He has no rales. He exhibits no tenderness.  Neurological: He is alert and oriented to person, place, and time.  Skin: Skin is warm and dry.  Psychiatric: He has a normal mood and affect. His behavior is normal. Judgment and thought content normal.       Assessment & Plan:   Problem List Items Addressed This Visit      Respiratory   Head  congestion    Symptoms and exam consistent with viral infection that appears to be resolving. Start Breo. Continue over the counter medication as needed for symptom relief and supportive care.       Other Visit Diagnoses   None.      I am having Mr. Florendo start on fluticasone furoate-vilanterol. I am also having him maintain his saw palmetto, glucose blood, nitroGLYCERIN, metFORMIN, potassium chloride SA, aspirin EC, atorvastatin, levalbuterol, budesonide, colchicine, metoprolol, NEEDLE (DISP) 18 G, NEEDLE (DISP) 23 G, Needles & Syringes, allopurinol, levETIRAcetam, albuterol, hydrochlorothiazide, tadalafil, potassium chloride, furosemide, testosterone cypionate, and zolpidem. We will continue to administer testosterone cypionate.   Meds ordered this encounter  Medications  . fluticasone furoate-vilanterol (BREO ELLIPTA) 100-25 MCG/INH AEPB    Sig: Inhale 1 puff  into the lungs daily.    Dispense:  28 each    Refill:  0    Order Specific Question:   Supervising Provider    Answer:   Hillard Danker A [[id number]]     Follow-up: Return if symptoms worsen or fail to improve.  Jeanine Luz, FNP

## 2016-01-10 NOTE — Patient Instructions (Signed)
Thank you for choosing [facility].  Summary/Instructions:   Start the Glandorf once daily.  If your symptoms worsen please let us know and we will consider a steroid or antibiotic depending upon your symptoms.   Your prescription(s) have been submitted to your pharmacy or been printed and provided for you. Please take as directed and contact our office if you believe you are having problem(s) with the medication(s) or have any questions.  If your symptoms worsen or fail to improve, please contact our office for further instruction, or in case of emergency go directly to the emergency room at the closest medical facility.   General Recommendations:    Please drink plenty of fluids.  Get plenty of rest   Sleep in humidified air  Use saline nasal sprays  Netti pot   OTC Medications:  Decongestants - helps relieve congestion   Flonase (generic fluticasone) or Nasacort (generic triamcinolone) - please make sure to use the "cross-over" technique at a 45 degree angle towards the opposite eye as opposed to straight up the nasal passageway.   Sudafed (generic pseudoephedrine - Note this is the one that is available behind the pharmacy counter); Products with phenylephrine (-PE) may also be used but is often not as effective as pseudoephedrine.   If you have HIGH BLOOD PRESSURE - Coricidin HBP; AVOID any product that is -D as this contains pseudoephedrine which may increase your blood pressure.  Afrin (oxymetazoline) every 6-8 hours for up to 3 days.   Allergies - helps relieve runny nose, itchy eyes and sneezing   Claritin (generic loratidine), Allegra (fexofenidine), or Zyrtec (generic cyrterizine) for runny nose. These medications should not cause drowsiness.  Note - Benadryl (generic diphenhydramine) may be used however may cause drowsiness  Cough -   Delsym or Robitussin (generic dextromethorphan)  Expectorants - helps loosen mucus to ease removal   Mucinex  (generic guaifenesin) as directed on the package.  Headaches / General Aches   Tylenol (generic acetaminophen) - DO NOT EXCEED 3 grams (3,000 mg) in a 24 hour time period  Advil/Motrin (generic ibuprofen)   Sore Throat -   Salt water gargle   Chloraseptic (generic benzocaine) spray or lozenges / Sucrets (generic dyclonine)

## 2016-01-10 NOTE — Assessment & Plan Note (Signed)
Symptoms and exam consistent with viral infection that appears to be resolving. Start Breo. Continue over the counter medication as needed for symptom relief and supportive care.

## 2016-01-20 ENCOUNTER — Other Ambulatory Visit: Payer: Self-pay | Admitting: *Deleted

## 2016-01-20 DIAGNOSIS — I10 Essential (primary) hypertension: Secondary | ICD-10-CM

## 2016-01-20 MED ORDER — FUROSEMIDE 40 MG PO TABS: 40.0000 mg | ORAL_TABLET | Freq: Every day | ORAL | 1 refills | Status: DC

## 2016-01-20 MED ORDER — METFORMIN HCL ER 500 MG PO TB24: 500.0000 mg | ORAL_TABLET | Freq: Two times a day (BID) | ORAL | 1 refills | Status: DC

## 2016-01-20 MED ORDER — POTASSIUM CHLORIDE CRYS ER 20 MEQ PO TBCR: 20.0000 meq | EXTENDED_RELEASE_TABLET | Freq: Two times a day (BID) | ORAL | 1 refills | Status: DC

## 2016-01-20 MED ORDER — HYDROCHLOROTHIAZIDE 25 MG PO TABS: 25.0000 mg | ORAL_TABLET | Freq: Every day | ORAL | 1 refills | Status: DC

## 2016-01-20 NOTE — Telephone Encounter (Signed)
Left msg on triage stating pharmacy has sent several request to have his medicine refill and they still haven't receive response. Back. Currently out of metformin, hctz, potassium, allopurinol, and lasix. Called pt verified pharmacy inform will send to [facility].....Raechel Chute

## 2016-02-12 DIAGNOSIS — I158 Other secondary hypertension: Secondary | ICD-10-CM | POA: Diagnosis not present

## 2016-02-12 DIAGNOSIS — D860 Sarcoidosis of lung: Secondary | ICD-10-CM | POA: Diagnosis not present

## 2016-02-12 DIAGNOSIS — E119 Type 2 diabetes mellitus without complications: Secondary | ICD-10-CM | POA: Diagnosis not present

## 2016-02-12 DIAGNOSIS — E784 Other hyperlipidemia: Secondary | ICD-10-CM | POA: Diagnosis not present

## 2016-02-12 DIAGNOSIS — M109 Gout, unspecified: Secondary | ICD-10-CM | POA: Diagnosis not present

## 2016-02-19 ENCOUNTER — Ambulatory Visit (INDEPENDENT_AMBULATORY_CARE_PROVIDER_SITE_OTHER): Payer: Medicare Other

## 2016-02-19 DIAGNOSIS — Z23 Encounter for immunization: Secondary | ICD-10-CM

## 2016-02-19 DIAGNOSIS — E291 Testicular hypofunction: Secondary | ICD-10-CM

## 2016-02-19 MED ORDER — TESTOSTERONE CYPIONATE 200 MG/ML IM SOLN
200.0000 mg | Freq: Once | INTRAMUSCULAR | Status: AC
  Administered 2016-02-19: 200 mg via INTRAMUSCULAR

## 2016-03-04 ENCOUNTER — Encounter: Payer: Self-pay | Admitting: Student

## 2016-03-09 ENCOUNTER — Encounter: Payer: Self-pay | Admitting: Internal Medicine

## 2016-03-09 LAB — C-REACTIVE PROTEIN: CRP: 1.3 mg/dL

## 2016-04-07 ENCOUNTER — Ambulatory Visit (INDEPENDENT_AMBULATORY_CARE_PROVIDER_SITE_OTHER): Payer: Medicare Other | Admitting: Student

## 2016-04-07 ENCOUNTER — Ambulatory Visit: Payer: Self-pay | Admitting: Cardiology

## 2016-04-07 ENCOUNTER — Encounter: Payer: Self-pay | Admitting: Student

## 2016-04-07 VITALS — BP 130/84 | HR 87 | Ht 75.0 in | Wt 282.0 lb

## 2016-04-07 DIAGNOSIS — E785 Hyperlipidemia, unspecified: Secondary | ICD-10-CM

## 2016-04-07 DIAGNOSIS — Z5181 Encounter for therapeutic drug level monitoring: Secondary | ICD-10-CM

## 2016-04-07 DIAGNOSIS — D869 Sarcoidosis, unspecified: Secondary | ICD-10-CM | POA: Diagnosis not present

## 2016-04-07 DIAGNOSIS — I10 Essential (primary) hypertension: Secondary | ICD-10-CM

## 2016-04-07 DIAGNOSIS — R079 Chest pain, unspecified: Secondary | ICD-10-CM | POA: Diagnosis not present

## 2016-04-07 NOTE — Progress Notes (Signed)
Cardiology Office Note    Date:  04/07/2016   ID:  [name], DOB December 15, 1970, MRN [id number]  PCP:  Xavier Linger, MD  Cardiologist:  Dr. Delton [name]   Chief Complaint  Patient presents with  . Chest Pain    History of Present Illness:    [name] is a 45 y.o. male with past medical history of CAD (nonobstructive disease by cath in 05/2014), HTN, HLD, Type 2 DM, RA, and pulmonary sarcoidosis who presents to the office today for evaluation of chest pain.  Was last seen by Dr. Delton [name] in 05/2014 and reported having chest discomfort which was worse with exertion and associated with dyspnea, diaphoresis, and nausea. With his presenting symptoms and known family history of premature CAD (father had fatal MI at age 2), a cardiac catheterization was recommended for further evaluation. This was performed and showed mild atherosclerosis along the LAD with widely patent LCx and RCA. EF was estimated at 60%. Risk factor modification was recommended.   He reports doing well since until this past weekend. Was walking at an outdoor festival when he developed a shooting pain along his left chest which went to his left arm. No associated nausea, vomiting, dyspnea, or diaphoresis. Took one SL NTG with resolution of his pain.   He had a recurrent episode two nights ago when lying down to go to bed. Described it as a shooting pain along his left pectoral region. Did not radiate anywhere and no associated symptoms. The shooting pain lasted 5-10 minutes and he felt a generalized soreness throughout the following day which eventually subsided. Denies any chest discomfort today.   Says he is active at baseline, exercising at the gym 3-4 times per week. Uses the stair climber, elliptical, and weight machines. Denies any chest discomfort or dyspnea with exertion while performing these activities, granted he has not exercised since this past weekend as he wanted to be seen in the office prior to resuming  his workout routine.    Past Medical History:  Diagnosis Date  . Arthritis    ? 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 Followed by Dr. Nickola Major  . CORONARY ARTERY DISEASE    a. cath 05/2014: mild LAD disease, normal LCx and RCA. Risk factor modification recommended.  Marland Kitchen DIABETES MELLITUS, TYPE II   . Edema   . ERECTILE DYSFUNCTION, ORGANIC   . GERD   . Gout, unspecified   . HYPERLIPIDEMIA   . HYPERTENSION   . 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 Pericarditis    recurrent  . POLYNEUROPATHY   . PULMONARY SARCOIDOSIS    Mediastinal lymphadenopathy with biospy proven sarcodosis  . TESTICULAR HYPOFUNCTION   . TIA (transient ischemic attack)     Past Surgical History:  Procedure Laterality Date  . BRONCHOSCOPY  08/21/08  . CARDIAC CATHETERIZATION  06/25/2009   minimal disease  . LEFT HEART CATHETERIZATION WITH CORONARY ANGIOGRAM N/A 05/18/2014   Procedure: LEFT HEART CATHETERIZATION WITH CORONARY ANGIOGRAM;  Surgeon: Corky Crafts, MD;  Location: [facility] CATH LAB;  Service: Cardiovascular;  Laterality: N/A;  . MEDIASTINOSCOPY  11/30/08    Current Medications: Outpatient Medications Prior to Visit  Medication Sig Dispense Refill  . albuterol (PROVENTIL HFA;VENTOLIN HFA) 108 (90 Base) MCG/ACT inhaler Inhale 2 puffs into the lungs every 6 (six) hours as needed for wheezing or shortness of breath. 1 Inhaler 11  . allopurinol (ZYLOPRIM) 300 MG tablet Take 1 tablet (300 mg total) by  mouth daily. 90 tablet 3  . aspirin EC 81 MG tablet Take 1 tablet (81 mg total) by mouth daily. 30 tablet 3  . atorvastatin (LIPITOR) 10 MG tablet Take 1 tablet (10 mg total) by mouth daily. 90 tablet 3  . budesonide (PULMICORT) 0.25 MG/2ML nebulizer solution Take 2 mLs (0.25 mg total) by nebulization 2 (two) times daily. DX: J44.0, D86.9, J06.9 60 mL 11  . colchicine (COLCRYS) 0.6 MG tablet Take 1 tablet (0.6 mg total) by mouth daily. 30 tablet 2  . fluticasone  furoate-vilanterol (BREO ELLIPTA) 100-25 MCG/INH AEPB Inhale 1 puff into the lungs daily. 28 each 0  . furosemide (LASIX) 40 MG tablet Take 1 tablet (40 mg total) by mouth daily. 90 tablet 1  . glucose blood (ONE TOUCH ULTRA TEST) test strip 1 each by Other route daily. Use as instructed 100 each 1  . levalbuterol (XOPENEX) 0.63 MG/3ML nebulizer solution Take 3 mLs (0.63 mg total) by nebulization every 6 (six) hours as needed. DX: D86.9 and DX: J06.9 120 mL 5  . levETIRAcetam (KEPPRA) 500 MG tablet Take 1,000 mg by mouth.    . metFORMIN (GLUCOPHAGE-XR) 500 MG 24 hr tablet Take 1 tablet (500 mg total) by mouth 2 (two) times daily with a meal. 180 tablet 1  . metoprolol (LOPRESSOR) 50 MG tablet Take 1 tablet (50 mg total) by mouth 2 (two) times daily. 180 tablet 0  . NEEDLE, DISP, 18 G 18G X 1" MISC Use to inject testosterone every 14 days 25 each 0  . NEEDLE, DISP, 23 G 23G X 1" MISC Use to inject testosterone every 14 days 25 each 0  . Needles & Syringes MISC Use to inject testosterone every 14 days. 10 each 3  . nitroGLYCERIN (NITROSTAT) 0.4 MG SL tablet Place 1 tablet (0.4 mg total) under the tongue every 5 (five) minutes as needed for chest pain. 90 tablet 3  . potassium chloride (K-DUR) 10 MEQ tablet Take 1 tablet (10 mEq total) by mouth 2 (two) times daily. 60 tablet 5  . potassium chloride SA (KLOR-CON M20) 20 MEQ tablet Take 1 tablet (20 mEq total) by mouth 2 (two) times daily. 180 tablet 1  . saw palmetto 160 MG capsule Take 160 mg by mouth daily.    . tadalafil (CIALIS) 20 MG tablet Take 0.5-1 tablets (10-20 mg total) by mouth every 3 (three) days as needed for erectile dysfunction. 10 tablet 11  . testosterone cypionate (DEPOTESTOSTERONE CYPIONATE) 200 MG/ML injection Inject 1 mL (200 mg total) into the muscle every 14 (fourteen) days. 10 mL 0  . zolpidem (AMBIEN) 5 MG tablet Take 5 mg by mouth.    . hydrochlorothiazide (HYDRODIURIL) 25 MG tablet Take 1 tablet (25 mg total) by mouth daily.  90 tablet 1   Facility-Administered Medications Prior to Visit  Medication Dose Route Frequency Provider Last Rate Last Dose  . testosterone cypionate (DEPOTESTOSTERONE CYPIONATE) injection 200 mg  200 mg Intramuscular Q14 Days Tresa Garter, MD   200 mg at 12/27/15 1548     Allergies:   Sulfonamide derivatives   Social History   Social History  . Marital status: Married    Spouse name: N/A  . Number of children: N/A  . Years of education: N/A   Social History Main Topics  . Smoking status: Former Smoker    Years: 15.00    Types: Cigarettes    Quit date: 07/09/2008  . Smokeless tobacco: Never Used  . Alcohol use 0.0 oz/week  Comment: occ  . Drug use: No  . Sexual activity: Not Asked   Other Topics Concern  . None   Social History Narrative   Married, lives with wife and 3 kids.    Currently student. 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   Review of Systems:   Please [name] the history of present illness.     General:  No chills, fever, night sweats or weight changes. Positive for generalized fatigue. Cardiovascular:  No dyspnea on exertion, edema, orthopnea, palpitations, paroxysmal nocturnal dyspnea. Positive for chest pain.  Dermatological: No rash, lesions/masses Respiratory: No cough, dyspnea Urologic: No hematuria, dysuria Abdominal:   No nausea, vomiting, diarrhea, bright red blood per rectum, melena, or hematemesis Neurologic:  No visual changes, wkns, changes in mental status. All other systems reviewed and are otherwise negative except as noted above.   Physical Exam:    VS:  BP 130/84   Pulse 87   Ht 6\' 3"  (1.905 m)   Wt 282 lb (127.9 kg)   BMI 35.25 kg/m    General: Well developed, well nourished,male appearing in no acute distress. Head: Normocephalic,  atraumatic, sclera non-icteric, no xanthomas, nares are without discharge.  Neck: No carotid bruits. JVD not elevated.  Lungs: Respirations regular and unlabored, without wheezes or rales.  Heart: Regular rate and rhythm. No S3 or S4.  No murmur, no rubs, or gallops appreciated. Abdomen: Soft, non-tender, non-distended with normoactive bowel sounds. No hepatomegaly. No rebound/guarding. No obvious abdominal masses. Msk:  Strength and tone appear normal for age. No joint deformities or effusions. Extremities: No clubbing or cyanosis. No lower extremity edema.  Distal pedal pulses are 2+ bilaterally. Neuro: Alert and oriented X 3. Moves all extremities spontaneously. No focal deficits noted. Psych:  Responds to questions appropriately with a normal affect. Skin: No rashes or lesions noted  Wt Readings from Last 3 Encounters:  04/07/16 282 lb (127.9 kg)  01/10/16 276 lb (125.2 kg)  11/06/15 270 lb (122.5 kg)     Studies/Labs Reviewed:   EKG:  EKG is ordered today. The EKG ordered today demonstrates NSR, 1st degree AV block, with up-sloping ST in anterior and lateral leads, secondary to LVH with repolarization abnormality. Similar to previous tracings.   Recent Labs: 10/15/2015: Hemoglobin 14.5; Platelets 239.0 11/06/2015: ALT 24; BUN 8; Creatinine, Ser 0.79; Potassium 3.3; Sodium 135   Lipid Panel    Component Value Date/Time   CHOL 191 04/29/2015 0919   TRIG 120.0 04/29/2015 0919   TRIG 90 06/25/2009 0000   HDL 45.00 04/29/2015 0919   CHOLHDL 4 04/29/2015 0919   VLDL 24.0 04/29/2015 0919   LDLCALC 122 (H) 04/29/2015 0919    Additional studies/ records that were reviewed today include:   Cardiac Catheterization: 05/2014 HEMODYNAMICS:  Aortic pressure was 105/70; LV pressure was 105/2; LVEDP 16.  There was no gradient between the left ventricle and aorta.    ANGIOGRAPHIC DATA:   The left main coronary artery is widely patent.  The left anterior descending artery is a large  vessel which wraps around the apex. There is mild atherosclerosis in the mid vessel. There are 2 large diagonals more proximally. There are widely patent. The third diagonal is medium-sized and patent.  The left circumflex artery is a large vessel. The first obtuse marginal  is large and branches across the lateral wall.  The second obtuse marginal is widely patent.  The right coronary artery is a large dominant vessel. There is an early bifurcation of the posterior lateral and posterior descending arteries. Both vessels are widely patent.  LEFT VENTRICULOGRAM:  Left ventricular angiogram was done in the 30 RAO projection and revealed normal left ventricular wall motion and systolic function with an estimated ejection fraction of 60 %.  LVEDP was 16 mmHg.  IMPRESSIONS:  1. Normal left main coronary artery. 2. Mild disease in the left anterior descending artery and its branches. 3. Widely patent left circumflex artery and its branches. 4. Widely patent right coronary artery. 5. Normal left ventricular systolic function.  LVEDP 16 mmHg.  Ejection fraction 60%.  RECOMMENDATION:  Continue preventative therapy. He needs aggressive risk factor modification. Follow-up with Dr. Delton SeeNelson.  Assessment:    1. Chest pain, unspecified type   2. Essential hypertension   3. Hyperlipidemia with target LDL less than 70   4. Sarcoidosis (HCC)   5. Medication monitoring encounter      Plan:   In order of problems listed above:  1. Chest Pain with Mixed Typical and Atypical Features - reports two episodes of chest discomfort in the past week. One occurring while walking at a festival, associated with left arm pain and relieved with SL NTG. Had repeat episode two nights ago while laying in bed which was sharp in character and lasted 5-10 minutes then experienced a dull ache throughout the next day. Had been exercising 3-4 days per week prior to this with no exertional chest discomfort or dyspnea.  -  had recent cardiac catheterization in 05/2014 which showed mild atherosclerosis along the LAD with widely patent LCx and RCA.  - patient is concerned about his symptoms as his father had a fatal MI at age 43. With his recent cath less than 2 years ago showing nonobstructive CAD, will pursue Exercise Myoview for initial assessment. - continue ASA, statin, and BB.  2. Essential HTN - BP well-controlled at 130/84 at today's visit. - continue Lasix and Lopressor. Will discontinue HCTZ 25mg  daily as he is currently on dual diuretic therapy with this and Lasix 40mg  daily. - encouraged the patient to check BP at home as we are discontinuing HCTZ and he may need further dose adjustment of his Lopressor or the addition of Amlodipine or an ACE-I/ARB. Will call or follow-up with PCP in regards to BP readings.  3. HLD - Lipitor 10mg  daily on medication list. Patient unsure if he is still taking this.  - followed by PCP. For annual physical with fasting labs in 2 weeks according to the patient.  4. Sarcoidosis - [facility]  5. Medication Monitoring - on dual diuretic therapy with HCTZ and Lasix. HCTZ discontinued as above. - obtain BMET today to assess creatinine.    Medication Adjustments/Labs and Tests Ordered: Current medicines are reviewed at length with the patient today.  Concerns regarding medicines are outlined above.  Medication changes, Labs and Tests ordered today are listed in the Patient Instructions below. Patient Instructions  Medication Instructions:   STOP TAKING HYDROCHLOROTHIAZIDE   If you need a refill on your cardiac medications before your next appointment, please call your pharmacy.  Labwork: BMET TODAY   Testing/Procedures: Your physician has requested that you have en exercise stress myoview. For further information please visit [email or web address]. Please follow instruction sheet, as given.  Follow-Up: BASED UPON RESULTS OF  STRESS TEST     Any Other Special Instructions Will Be Listed Below (If Applicable).   Signed, Ellsworth Lennox, PA  04/07/2016 2:55 PM    [facility] [address], [address] [place], [place]  [address] Phone: [phone]; Fax: [phone]  [address] [place], [place] [address] Phone: [phone]

## 2016-04-07 NOTE — Patient Instructions (Signed)
Medication Instructions:   STOP TAKING HYDROCHLOROTHIAZIDE   If you need a refill on your cardiac medications before your next appointment, please call your pharmacy.  Labwork: BMET TODAY    Testing/Procedures: Your physician has requested that you have en exercise stress myoview. For further information please visit [email or web address] Please follow instruction sheet, as given.    Follow-Up: BASED UPON RESULTS OF STRESS TEST    Any Other Special Instructions Will Be Listed Below (If Applicable).

## 2016-04-08 ENCOUNTER — Other Ambulatory Visit: Payer: Self-pay

## 2016-04-08 LAB — BASIC METABOLIC PANEL
BUN: 9 mg/dL (ref 7–25)
CO2: 29 mmol/L (ref 20–31)
Calcium: 9.5 mg/dL (ref 8.6–10.3)
Chloride: 97 mmol/L — ABNORMAL LOW (ref 98–110)
Creat: 0.91 mg/dL (ref 0.60–1.35)
Glucose, Bld: 119 mg/dL — ABNORMAL HIGH (ref 65–99)
Potassium: 3.8 mmol/L (ref 3.5–5.3)
Sodium: 136 mmol/L (ref 135–146)

## 2016-04-13 ENCOUNTER — Ambulatory Visit: Payer: Medicare Other

## 2016-04-13 ENCOUNTER — Encounter (HOSPITAL_COMMUNITY): Payer: Self-pay

## 2016-04-13 ENCOUNTER — Emergency Department (HOSPITAL_COMMUNITY): Payer: Medicare Other

## 2016-04-13 ENCOUNTER — Observation Stay (HOSPITAL_COMMUNITY)
Admission: EM | Admit: 2016-04-13 | Discharge: 2016-04-14 | Disposition: A | Discharge status: Home / Self Care | Payer: Medicare Other | Attending: Cardiovascular Disease | Admitting: Cardiovascular Disease

## 2016-04-13 DIAGNOSIS — Z87891 Personal history of nicotine dependence: Secondary | ICD-10-CM | POA: Diagnosis not present

## 2016-04-13 DIAGNOSIS — D862 Sarcoidosis of lung with sarcoidosis of lymph nodes: Secondary | ICD-10-CM | POA: Diagnosis not present

## 2016-04-13 DIAGNOSIS — Z79899 Other long term (current) drug therapy: Secondary | ICD-10-CM | POA: Insufficient documentation

## 2016-04-13 DIAGNOSIS — R0602 Shortness of breath: Secondary | ICD-10-CM | POA: Insufficient documentation

## 2016-04-13 DIAGNOSIS — Z8249 Family history of ischemic heart disease and other diseases of the circulatory system: Secondary | ICD-10-CM | POA: Diagnosis not present

## 2016-04-13 DIAGNOSIS — Z8673 Personal history of transient ischemic attack (TIA), and cerebral infarction without residual deficits: Secondary | ICD-10-CM | POA: Insufficient documentation

## 2016-04-13 DIAGNOSIS — I2510 Atherosclerotic heart disease of native coronary artery without angina pectoris: Secondary | ICD-10-CM | POA: Diagnosis not present

## 2016-04-13 DIAGNOSIS — E1142 Type 2 diabetes mellitus with diabetic polyneuropathy: Secondary | ICD-10-CM | POA: Insufficient documentation

## 2016-04-13 DIAGNOSIS — R0789 Other chest pain: Principal | ICD-10-CM | POA: Insufficient documentation

## 2016-04-13 DIAGNOSIS — M109 Gout, unspecified: Secondary | ICD-10-CM | POA: Insufficient documentation

## 2016-04-13 DIAGNOSIS — K219 Gastro-esophageal reflux disease without esophagitis: Secondary | ICD-10-CM | POA: Insufficient documentation

## 2016-04-13 DIAGNOSIS — E785 Hyperlipidemia, unspecified: Secondary | ICD-10-CM | POA: Diagnosis not present

## 2016-04-13 DIAGNOSIS — I200 Unstable angina: Secondary | ICD-10-CM | POA: Diagnosis not present

## 2016-04-13 DIAGNOSIS — R072 Precordial pain: Secondary | ICD-10-CM | POA: Diagnosis not present

## 2016-04-13 DIAGNOSIS — M069 Rheumatoid arthritis, unspecified: Secondary | ICD-10-CM | POA: Insufficient documentation

## 2016-04-13 DIAGNOSIS — I10 Essential (primary) hypertension: Secondary | ICD-10-CM

## 2016-04-13 DIAGNOSIS — R079 Chest pain, unspecified: Secondary | ICD-10-CM

## 2016-04-13 HISTORY — DX: Unspecified asthma, uncomplicated: J45.909

## 2016-04-13 HISTORY — DX: Cardiac murmur, unspecified: R01.1

## 2016-04-13 HISTORY — DX: Angina pectoris, unspecified: I20.9

## 2016-04-13 HISTORY — DX: Unspecified convulsions: R56.9

## 2016-04-13 LAB — BASIC METABOLIC PANEL
Anion gap: 8 (ref 5–15)
BUN: 5 mg/dL — ABNORMAL LOW (ref 6–20)
CO2: 26 mmol/L (ref 22–32)
Calcium: 8.9 mg/dL (ref 8.9–10.3)
Chloride: 101 mmol/L (ref 101–111)
Creatinine, Ser: 0.81 mg/dL (ref 0.61–1.24)
GFR calc Af Amer: >60 mL/min (ref >60)
GFR calc non Af Amer: >60 mL/min (ref >60)
Glucose, Bld: 150 mg/dL — ABNORMAL HIGH (ref 65–99)
Potassium: 3.8 mmol/L (ref 3.5–5.1)
Sodium: 135 mmol/L (ref 135–145)

## 2016-04-13 LAB — GLUCOSE, CAPILLARY
Glucose-Capillary: 193 mg/dL — ABNORMAL HIGH (ref 65–99)
Glucose-Capillary: 162 mg/dL — ABNORMAL HIGH (ref 65–99)

## 2016-04-13 LAB — HEPARIN LEVEL (UNFRACTIONATED): Heparin Unfractionated: 0.31 IU/mL (ref 0.30–0.70)

## 2016-04-13 LAB — I-STAT TROPONIN, ED: Troponin i, poc: 0 ng/mL (ref 0.00–0.08)

## 2016-04-13 LAB — CBC
HCT: 45.8 % (ref 39.0–52.0)
Hemoglobin: 16.5 g/dL (ref 13.0–17.0)
MCH: 27.5 pg (ref 26.0–34.0)
MCHC: 36 g/dL (ref 30.0–36.0)
MCV: 76.2 fL — ABNORMAL LOW (ref 78.0–100.0)
Platelets: 261 10*3/uL (ref 150–400)
RBC: 6.01 MIL/uL — ABNORMAL HIGH (ref 4.22–5.81)
RDW: 13.7 % (ref 11.5–15.5)
WBC: 5.5 10*3/uL (ref 4.0–10.5)

## 2016-04-13 LAB — TROPONIN I: Troponin I: <0.03 ng/mL (ref <0.03)

## 2016-04-13 MED ORDER — HEPARIN BOLUS VIA INFUSION
4000.0000 [IU] | Freq: Once | INTRAVENOUS | Status: AC
  Administered 2016-04-13: 4000 [IU] via INTRAVENOUS
  Filled 2016-04-13: qty 4000

## 2016-04-13 MED ORDER — ALLOPURINOL 300 MG PO TABS
300.0000 mg | ORAL_TABLET | Freq: Every day | ORAL | Status: DC
  Administered 2016-04-13: 300 mg via ORAL
  Filled 2016-04-13: qty 1

## 2016-04-13 MED ORDER — METOPROLOL TARTRATE 50 MG PO TABS
50.0000 mg | ORAL_TABLET | Freq: Two times a day (BID) | ORAL | Status: DC
  Administered 2016-04-13: 50 mg via ORAL
  Filled 2016-04-13: qty 1

## 2016-04-13 MED ORDER — NITROGLYCERIN 0.4 MG SL SUBL: 0.4000 mg | SUBLINGUAL_TABLET | SUBLINGUAL | Status: DC | PRN

## 2016-04-13 MED ORDER — NITROGLYCERIN 2 % TD OINT
1.0000 [in_us] | TOPICAL_OINTMENT | Freq: Four times a day (QID) | TRANSDERMAL | Status: DC
  Administered 2016-04-13: 1 [in_us] via TOPICAL
  Filled 2016-04-13: qty 30

## 2016-04-13 MED ORDER — ASPIRIN 81 MG PO CHEW
324.0000 mg | CHEWABLE_TABLET | Freq: Once | ORAL | Status: AC
  Administered 2016-04-13: 324 mg via ORAL
  Filled 2016-04-13: qty 4

## 2016-04-13 MED ORDER — NITROGLYCERIN 2 % TD OINT
1.0000 [in_us] | TOPICAL_OINTMENT | Freq: Once | TRANSDERMAL | Status: AC
  Administered 2016-04-13: 1 [in_us] via TOPICAL
  Filled 2016-04-13: qty 1

## 2016-04-13 MED ORDER — ATORVASTATIN CALCIUM 40 MG PO TABS
40.0000 mg | ORAL_TABLET | Freq: Every day | ORAL | Status: DC
  Administered 2016-04-13: 40 mg via ORAL
  Filled 2016-04-13: qty 1

## 2016-04-13 MED ORDER — NITROGLYCERIN 0.4 MG SL SUBL
0.4000 mg | SUBLINGUAL_TABLET | SUBLINGUAL | Status: DC | PRN
  Administered 2016-04-13 (×2): 0.4 mg via SUBLINGUAL
  Filled 2016-04-13: qty 1

## 2016-04-13 MED ORDER — ASPIRIN EC 81 MG PO TBEC: 81.0000 mg | DELAYED_RELEASE_TABLET | Freq: Every day | ORAL | Status: DC

## 2016-04-13 MED ORDER — HEPARIN (PORCINE) IN NACL 100-0.45 UNIT/ML-% IJ SOLN
1900.0000 [IU]/h | INTRAMUSCULAR | Status: DC
  Administered 2016-04-13: 1500 [IU]/h via INTRAVENOUS
  Administered 2016-04-14: 1600 [IU]/h via INTRAVENOUS
  Filled 2016-04-13 (×3): qty 250

## 2016-04-13 MED ORDER — VITAMIN D 1000 UNITS PO TABS
1000.0000 [IU] | ORAL_TABLET | Freq: Every day | ORAL | Status: DC
  Administered 2016-04-13: 1000 [IU] via ORAL
  Filled 2016-04-13: qty 1

## 2016-04-13 MED ORDER — ACETAMINOPHEN 325 MG PO TABS: 650.0000 mg | ORAL_TABLET | ORAL | Status: DC | PRN

## 2016-04-13 MED ORDER — INSULIN ASPART 100 UNIT/ML ~~LOC~~ SOLN: 0.0000 [IU] | Freq: Three times a day (TID) | SUBCUTANEOUS | Status: DC

## 2016-04-13 MED ORDER — ONDANSETRON HCL 4 MG/2ML IJ SOLN: 4.0000 mg | Freq: Four times a day (QID) | INTRAMUSCULAR | Status: DC | PRN

## 2016-04-13 NOTE — Progress Notes (Signed)
Patient with history of diabetes and CBG on admission 193, PA on call paged through [facility] system will await call back . Sendy Pluta, Randall An RN

## 2016-04-13 NOTE — ED Notes (Signed)
EDP knows about ST elevation. Pt has hx of cardiomyopathy and EKG readings show ST elevation due to that.

## 2016-04-13 NOTE — Progress Notes (Signed)
PA oncall called regarding patient and orders received for CBGs and insulin will monitor patient. Cathi Hazan, Randall An RN

## 2016-04-13 NOTE — Progress Notes (Signed)
Received report from ED. Pricila Bridge, Randall An RN

## 2016-04-13 NOTE — ED Notes (Signed)
Attempted to call report- they will call back

## 2016-04-13 NOTE — ED Provider Notes (Signed)
[facility]-EMERGENCY DEPT Provider Note   CSN: [id number] Arrival date & time: 04/13/16  0732     History   Chief Complaint Chief Complaint  Patient presents with  . Chest Pain  . Shortness of Breath    HPI [name] is a 45 y.o. male.  Pt presents to the ED today with CP and SOB.  The pt said that he has been getting it intermittently for about 1 week.  The pt did see the PA at the cardiologist's office on 10/31 for the same.  He did have a cath in 2015 which showed nonobstructive disease.  He was told they would schedule him for a stress test and if pain worsened, then to come to the ED.  The pt said the pain would come, he would take a nitro, then it would go away.  Then it would come back.       Past Medical History:  Diagnosis Date  . Arthritis    ? 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 Followed by Dr. Nickola Major  . CORONARY ARTERY DISEASE    a. cath 05/2014: mild LAD disease, normal LCx and RCA. Risk factor modification recommended.  Marland Kitchen DIABETES MELLITUS, TYPE II   . Edema   . ERECTILE DYSFUNCTION, ORGANIC   . GERD   . Gout, unspecified   . HYPERLIPIDEMIA   . HYPERTENSION   . 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 Pericarditis    recurrent  . POLYNEUROPATHY   . PULMONARY SARCOIDOSIS    Mediastinal lymphadenopathy with biospy proven sarcodosis  . TESTICULAR HYPOFUNCTION   . TIA (transient ischemic attack)     Patient Active Problem List   Diagnosis Date Noted  . Head congestion 01/10/2016  . Drug-induced erectile dysfunction 11/06/2015  . Hypokalemia 11/06/2015  . Obese   . Diabetes mellitus (HCC) 07/29/2011  . Sarcoidosis (HCC) 09/29/2010  . GERD 01/07/2010  . Exercise-induced asthma 12/03/2009  . POLYNEUROPATHY 10/17/2009  . Male hypogonadism 10/08/2009  . Diabetes mellitus type 2 in obese (HCC) 10/07/2009  . Hyperlipidemia with target LDL less than 70 10/07/2009  . Gout 10/07/2009  . Essential hypertension  10/07/2009  . CAD (coronary artery disease) 10/07/2009  . Male erectile dysfunction 10/07/2009  . ARTHRITIS 07/11/2009    Past Surgical History:  Procedure Laterality Date  . BRONCHOSCOPY  08/21/08  . CARDIAC CATHETERIZATION  06/25/2009   minimal disease  . LEFT HEART CATHETERIZATION WITH CORONARY ANGIOGRAM N/A 05/18/2014   Procedure: LEFT HEART CATHETERIZATION WITH CORONARY ANGIOGRAM;  Surgeon: Corky Crafts, MD;  Location: [facility] CATH LAB;  Service: Cardiovascular;  Laterality: N/A;  . MEDIASTINOSCOPY  11/30/08       Home Medications    Prior to Admission medications   Medication Sig Start Date End Date Taking? Authorizing Provider  albuterol (PROVENTIL HFA;VENTOLIN HFA) 108 (90 Base) MCG/ACT inhaler Inhale 2 puffs into the lungs every 6 (six) hours as needed for wheezing or shortness of breath. 11/06/15  Yes [name], MD  allopurinol (ZYLOPRIM) 300 MG tablet Take 1 tablet (300 mg total) by mouth daily. 11/06/15  Yes [name], MD  aspirin EC 81 MG tablet Take 1 tablet (81 mg total) by mouth daily. 04/29/15  Yes Newt Lukes, MD  cholecalciferol (VITAMIN D) 1000 units tablet Take 1,000 Units by mouth daily.   Yes Historical Provider, MD  furosemide (LASIX) 40 MG tablet Take 1 tablet (40 mg total) by mouth daily. 01/20/16  Yes  [name], MD  glucose blood (ONE TOUCH ULTRA TEST) test strip 1 each by Other route daily. Use as instructed 12/23/11  Yes Newt Lukes, MD  metFORMIN (GLUCOPHAGE-XR) 500 MG 24 hr tablet Take 1 tablet (500 mg total) by mouth 2 (two) times daily with a meal. 01/20/16  Yes [name], MD  metoprolol (LOPRESSOR) 50 MG tablet Take 1 tablet (50 mg total) by mouth 2 (two) times daily. 06/24/15  Yes [name], MD  nitroGLYCERIN (NITROSTAT) 0.4 MG SL tablet Place 1 tablet (0.4 mg total) under the tongue every 5 (five) minutes as needed for chest pain. 05/14/14  Yes Lars Masson, MD  potassium chloride SA (KLOR-CON M20) 20 MEQ tablet  Take 1 tablet (20 mEq total) by mouth 2 (two) times daily. 01/20/16  Yes [name], MD  saw palmetto 160 MG capsule Take 160 mg by mouth daily.   Yes Historical Provider, MD  tadalafil (CIALIS) 20 MG tablet Take 0.5-1 tablets (10-20 mg total) by mouth every 3 (three) days as needed for erectile dysfunction. 11/06/15  Yes [name], MD  testosterone cypionate (DEPOTESTOSTERONE CYPIONATE) 200 MG/ML injection Inject 1 mL (200 mg total) into the muscle every 14 (fourteen) days. 12/12/15  Yes [name], MD    Family History Family History  Problem Relation Age of Onset  . Diabetes Mother   . Hypertension Mother   . Diabetes Father   . Heart disease Father 83    Fatal MI  . Heart failure Father     Social History Social History  Substance Use Topics  . Smoking status: Former Smoker    Years: 15.00    Types: Cigarettes    Quit date: 07/09/2008  . Smokeless tobacco: Never Used  . Alcohol use 0.0 oz/week     Comment: occ     Allergies   Sulfonamide derivatives   Review of Systems Review of Systems  Cardiovascular: Positive for chest pain.  All other systems reviewed and are negative.    Physical Exam Updated Vital Signs BP 120/77   Pulse 66   Temp 97.7 F (36.5 C) (Oral)   Resp 24   Ht 6\' 3"  (1.905 m)   Wt 282 lb (127.9 kg)   SpO2 97%   BMI 35.25 kg/m   Physical Exam  Constitutional: He is oriented to person, place, and time. He appears well-developed and well-nourished.  HENT:  Head: Normocephalic and atraumatic.  Right Ear: External ear normal.  Left Ear: External ear normal.  Nose: Nose normal.  Mouth/Throat: Oropharynx is clear and moist.  Eyes: Conjunctivae and EOM are normal. Pupils are equal, round, and reactive to light.  Neck: Normal range of motion. Neck supple.  Cardiovascular: Normal rate, regular rhythm, normal heart sounds and intact distal pulses.   Pulmonary/Chest: Effort normal and breath sounds normal.  Abdominal: Soft. Bowel sounds  are normal.  Musculoskeletal: Normal range of motion.  Neurological: He is alert and oriented to person, place, and time.  Skin: Skin is warm.  Psychiatric: He has a normal mood and affect. His behavior is normal. Judgment and thought content normal.  Nursing note and vitals reviewed.    ED Treatments / Results  Labs (all labs ordered are listed, but only abnormal results are displayed) Labs Reviewed  BASIC METABOLIC PANEL - Abnormal; Notable for the following:       Result Value   Glucose, Bld 150 (*)    BUN 5 (*)  All other components within normal limits  CBC - Abnormal; Notable for the following:    RBC 6.01 (*)    MCV 76.2 (*)    All other components within normal limits  TROPONIN I  HEPARIN LEVEL (UNFRACTIONATED)  I-STAT TROPOININ, ED    EKG  EKG Interpretation  Date/Time:  Monday April 13 2016 07:35:36 EST Ventricular Rate:  82 PR Interval:  210 QRS Duration: 82 QT Interval:  366 QTC Calculation: 427 R Axis:   13 Text Interpretation:  Sinus rhythm with 1st degree A-V block Right atrial enlargement Anterior infarct , age undetermined Inferolateral injury pattern Abnormal ECG No significant change since last tracing Confirmed by [facility] MD, Judithann Villamar ([id number]) on 04/13/2016 7:51:30 AM       Radiology Dg Chest 2 View  Result Date: 04/13/2016 CLINICAL DATA:  Chest pain for 1 week with progression EXAM: CHEST  2 VIEW COMPARISON:  November 22, 2013 FINDINGS: There is no edema or consolidation. The heart size and pulmonary vascularity are normal. No adenopathy. There is degenerative change in the thoracic spine. IMPRESSION: No edema or consolidation. Electronically Signed   By: Bretta Bang III M.D.   On: 04/13/2016 08:52    Procedures Procedures (including critical care time)  Medications Ordered in ED Medications  nitroGLYCERIN (NITROSTAT) SL tablet 0.4 mg (0.4 mg Sublingual Given 04/13/16 0910)  heparin ADULT infusion 100 units/mL (25000 units/262mL sodium  chloride 0.45%) (1,500 Units/hr Intravenous New Bag/Given 04/13/16 1244)  aspirin chewable tablet 324 mg (324 mg Oral Given 04/13/16 0825)  nitroGLYCERIN (NITROGLYN) 2 % ointment 1 inch (1 inch Topical Given 04/13/16 0957)  heparin bolus via infusion 4,000 Units (4,000 Units Intravenous Bolus from Bag 04/13/16 1246)     Initial Impression / Assessment and Plan / ED Course  I have reviewed the triage vital signs and the nursing notes.  Pertinent labs & imaging results that were available during my care of the patient were reviewed by me and considered in my medical decision making (see chart for details).  Clinical Course    Pt's CP gone with 2 nitro.  Pt d/w cardiology who will come see patient.  Cardiology saw pt and will admit and take him to the cath lab.  Final Clinical Impressions(s) / ED Diagnoses   Final diagnoses:  Chest pain, unspecified type  Unstable angina [facility]  Essential hypertension    New Prescriptions New Prescriptions   No medications on file     Jacalyn Lefevre, MD 04/13/16 1349

## 2016-04-13 NOTE — ED Notes (Signed)
Phlebotomy at the bedside  

## 2016-04-13 NOTE — Progress Notes (Signed)
ANTICOAGULATION CONSULT NOTE - Follow-up Consult  Pharmacy Consult for heparin Indication: chest pain/ACS  Allergies  Allergen Reactions  . Sulfonamide Derivatives Rash and Other (See Comments)    Patient Measurements: Height: 6\' 3"  (190.5 cm) Weight: 276 lb 10.8 oz (125.5 kg) IBW/kg (Calculated) : 84.5 Heparin Dosing Weight: 112 kg  Vital Signs: Temp: 97.7 F (36.5 C) (11/06 1544) Temp Source: Oral (11/06 1544) BP: 120/67 (11/06 1544) Pulse Rate: 74 (11/06 1544)  Labs:  Recent Labs  04/13/16 0744 04/13/16 1141 04/13/16 1843  HGB 16.5  --   --   HCT 45.8  --   --   PLT 261  --   --   HEPARINUNFRC  --   --  0.31  CREATININE 0.81  --   --   TROPONINI  --  <0.03  --    Estimated Creatinine Clearance: 167.8 mL/min (by C-G formula based on SCr of 0.81 mg/dL).  Medical History: Past Medical History:  Diagnosis Date  . Anginal pain (HCC)   . Arthritis    ? 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 Followed by Dr. 13/06/17  . Arthritis    "ankles" (04/13/2016)  . Asthma   . CORONARY ARTERY DISEASE    a. cath 05/2014: mild LAD disease, normal LCx and RCA. Risk factor modification recommended.  06/2014 DIABETES MELLITUS, TYPE II   . Edema   . ERECTILE DYSFUNCTION, ORGANIC   . GERD   . Gout, unspecified   . Heart murmur   . HYPERLIPIDEMIA   . HYPERTENSION   . 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 Pericarditis    recurrent  . POLYNEUROPATHY   . PULMONARY SARCOIDOSIS    Mediastinal lymphadenopathy with biospy proven sarcodosis  . Seizures (HCC)    "none in 4-5 years; don't know what kind; not related to alcohol" (04/13/2016)  . TESTICULAR HYPOFUNCTION   . TIA (transient ischemic attack)    "I don't remember when" (04/13/2016)   Assessment: 45 yo M in ED with CP x 1 week. PMH of CM, EKG w/ ST elevations due to CM.   Pharmacy consulted to dose heparin for ACS/STEMI.  He says he does not take any blood thinners at home.  TBW 128 kg,  HDW 112 kg. Creat WNL, CBC WNL, Trop neg x 1 istat. No bleeding or infusion issues reported.  Goal of Therapy:  Heparin level 0.3-0.7 units/ml Monitor platelets by anticoagulation protocol: Yes   Plan:  Increase heparin infusion to 1600 units/hr Check anti-Xa level in 6 hours and daily while on heparin Continue to monitor H&H and platelets  57, PharmD Clinical Pharmacist Pager: [phone] 04/13/2016 8:00 PM

## 2016-04-13 NOTE — H&P (Signed)
Cardiology H&P    Patient ID: ANTWOIN LACKEY MRN: [id number], DOB/AGE: 1971/04/12   Admit date: 04/13/2016 Date of Consult: 04/13/2016  Primary Physician: Sanda Linger, MD Reason for Consult: Chest pain Primary Cardiologist: Dr. Delton See Requesting Provider: Dr. Particia Nearing  Patient Profile    Mr. Alberg is a 45 year old male with a past medical of CAD (nonobstructive by cath in 05/2014), HTN, HLD, DM, RA and pulmonary sarcoidosis. Presents with chest pain for the past week.   History of Present Illness    Mr. Kalisz starting having chest pain about a week ago, and was seen in our office on 04/06/16. He was scheduled for a [facility], but this was scheduled for 04/22/16.   His chest pain has gotten much worse over the weekend. He has stairs in his home and after climbing 4 of them he develops chest pain and SOB. Pain is located on the left side of his chest with radiation down his left arm. The pain lasts a few minutes and is relieved with SL Nitro. On Saturday night he was sleeping and was awakened with chest pain associated with SOB.   He was urged by his wife today to come in and seek evaluation for his chest pain. He has on Nitro paste and is currently pain free. His EKG shows NSR with diffuse ST elevation consistent with LVH and repolarization abnormalities. His EKG from December 2015 is similar to today's EKG. His initial troponin is negative.   He is a former smoker. His dad died from MI when he was 68.   Past Medical History       Past Medical History:  Diagnosis Date  . Arthritis    ? 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 Followed by Dr. Nickola Major  . CORONARY ARTERY DISEASE    a. cath 05/2014: mild LAD disease, normal LCx and RCA. Risk factor modification recommended.  Marland Kitchen DIABETES MELLITUS, TYPE II   . Edema   . ERECTILE DYSFUNCTION, ORGANIC   . GERD   . Gout, unspecified   . HYPERLIPIDEMIA   . HYPERTENSION   . Narcolepsy without  cataplexy(347.00)    MSLT 01/09/09 & MRI brain 01/09/09  . Pericarditis    recurrent  . POLYNEUROPATHY   . PULMONARY SARCOIDOSIS    Mediastinal lymphadenopathy with biospy proven sarcodosis  . TESTICULAR HYPOFUNCTION   . TIA (transient ischemic attack)          Past Surgical History:  Procedure Laterality Date  . BRONCHOSCOPY  08/21/08  . CARDIAC CATHETERIZATION  06/25/2009   minimal disease  . LEFT HEART CATHETERIZATION WITH CORONARY ANGIOGRAM N/A 05/18/2014   Procedure: LEFT HEART CATHETERIZATION WITH CORONARY ANGIOGRAM;  Surgeon: Corky Crafts, MD;  Location: [facility] CATH LAB;  Service: Cardiovascular;  Laterality: N/A;  . MEDIASTINOSCOPY  11/30/08     Allergies      Allergies  Allergen Reactions  . Sulfonamide Derivatives Rash and Other (See Comments)    Inpatient Medications      Family History          Family History  Problem Relation Age of Onset  . Diabetes Mother   . Hypertension Mother   . Diabetes Father   . Heart disease Father 71    Fatal MI  . Heart failure Father     Social History    Social History        Social History  . Marital status: Single    Spouse name: N/A  . Number of  children: N/A  . Years of education: N/A      Occupational History  . Not on file.         Social History Main Topics  . Smoking status: Former Smoker    Years: 15.00    Types: Cigarettes    Quit date: 07/09/2008  . Smokeless tobacco: Never Used  . Alcohol use 0.0 oz/week     Comment: occ  . Drug use: No  . Sexual activity: Not on file       Other Topics Concern  . Not on file      Social History Narrative   Married, lives with wife and 3 kids.    Currently student. 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  Cardiovascular:  + chest pain, dyspnea on exertion, edema, orthopnea, palpitations, paroxysmal nocturnal  dyspnea. Dermatological: No rash, lesions/masses Respiratory: No cough, dyspnea Urologic: No hematuria, dysuria Abdominal:   No nausea, vomiting, diarrhea, bright red blood per rectum, melena, or hematemesis Neurologic:  No visual changes, wkns, changes in mental status. All other systems reviewed and are otherwise negative except as noted above.  Physical Exam    Blood pressure 108/70, pulse 68, temperature 97.7 F (36.5 C), temperature source Oral, resp. rate 21, height 6\' 3"  (1.905 m), weight 282 lb (127.9 kg), SpO2 99 %.  General: Pleasant, NAD Psych: Normal affect. Neuro: Alert and oriented X 3. Moves all extremities spontaneously. HEENT: Normal           Neck: Supple without bruits or JVD. Lungs:  Resp regular and unlabored, CTA. Heart: RRR no s3, s4, or murmurs. Abdomen: Soft, non-tender, non-distended, BS + x 4.  Extremities: No clubbing, cyanosis or edema. DP/PT/Radials 2+ and equal bilaterally.  Labs    Troponin (Point of Care Test)  Recent Labs (last 2 labs)    Recent Labs  04/13/16 0757  TROPIPOC 0.00      Recent Labs       Lab Results  Component Value Date   WBC 5.5 04/13/2016   HGB 16.5 04/13/2016   HCT 45.8 04/13/2016   MCV 76.2 (L) 04/13/2016   PLT 261 04/13/2016      Recent Labs Lab 04/13/16 0744  NA 135  K 3.8  CL 101  CO2 26  BUN 5*  CREATININE 0.81  CALCIUM 8.9  GLUCOSE 150*   Recent Labs       Lab Results  Component Value Date   CHOL 191 04/29/2015   HDL 45.00 04/29/2015   LDLCALC 122 (H) 04/29/2015   TRIG 120.0 04/29/2015       Radiology Studies     Imaging Results  Dg Chest 2 View  Result Date: 04/13/2016 CLINICAL DATA:  Chest pain for 1 week with progression EXAM: CHEST  2 VIEW COMPARISON:  November 22, 2013 FINDINGS: There is no edema or consolidation. The heart size and pulmonary vascularity are normal. No adenopathy. There is degenerative change in the thoracic spine. IMPRESSION: No edema or  consolidation. Electronically Signed   By: November 24, 2013 III M.D.   On: 04/13/2016 08:52     EKG & Cardiac Imaging    EKG: NSR, ST elevation diffusely    Assessment & Plan    1. Chest pain: patient has had intermittent chest pain at rest and with exertion. Pain has worsened over the weekend and has awakened him from  sleep recently. Pain is relieved with SL Nitro.   He has risk factors for CAD including DM, former tobacco abuse and strong family history.   He had a heart cath in 2015 for the same symptoms and had normal cors. It is unlikely that he has developed CAD since his last cath. But in light of his symptoms with increased activity intolerance, classical anginal symptoms and strong family history he needs repeat coronary angiography.   His EKG is consistent with early repolarization abnormality and has essentially looked the same since 2015. We will follow troponin results and if next set comes back positive he will need more urgent cardiac cath.   Start heparin gtt, continue Nitro paste. Will order Echo.   2. DM: Managed by his PCP. Last A1c was 7.2 6 months ago. He does use insulin.   3. HTN: Continue metoprolol.     Signed, Little IshikawaErin E Smith, NP 04/13/2016, 11:33 AM Pager: [phone]      Attending Note:   The patient was seen and examined.  Agree with assessment and plan as noted above.  Changes made to the above note as needed.  Patient seen and independently examined with Suzzette RighterErin Smith, NP.   We discussed all aspects of the encounter. I agree with the assessment and plan as stated above.  Pt presents today with significant worsening of his chest pain / tightness.  Symptoms have been worsening over the past several weeks.   ECG shows early repol changes and is unchanged from previous several years.  I think our best option is to repeat his cath  We discussed the risks / benefits / opotions. He understands and agrees to proceed.    I have spent a  total of 40 minutes with patient reviewing hospital  notes , telemetry, EKGs, labs and examining patient as well as establishing an assessment and plan that was discussed with the patient. > 50% of time was spent in direct patient care.    Vesta MixerPhilip J. Trenna Kiely, Montez HagemanJr., MD, [facility] 04/13/2016, 5:49 PM [address],  [address] Office [phone] Pager [phone]- [phone]

## 2016-04-13 NOTE — ED Triage Notes (Signed)
Pt reports chest pain X1 week. He reports radiation into left arm. Pt reports hx of diabetes and HTN. Pt reports some relief with nitro yesterday. Pt states pain is 3/10

## 2016-04-13 NOTE — Progress Notes (Signed)
ANTICOAGULATION CONSULT NOTE - Initial Consult  Pharmacy Consult for heparin Indication: chest pain/ACS  Allergies  Allergen Reactions  . Sulfonamide Derivatives Rash and Other (See Comments)    Patient Measurements: Height: 6\' 3"  (190.5 cm) Weight: 282 lb (127.9 kg) IBW/kg (Calculated) : 84.5 Heparin Dosing Weight: 112 kg  Vital Signs: Temp: 97.7 F (36.5 C) (11/06 0919) Temp Source: Oral (11/06 0919) BP: 108/70 (11/06 1118) Pulse Rate: 68 (11/06 1118)  Labs:  Recent Labs  04/13/16 0744  HGB 16.5  HCT 45.8  PLT 261  CREATININE 0.81    Estimated Creatinine Clearance: 169.5 mL/min (by C-G formula based on SCr of 0.81 mg/dL).   Medical History: Past Medical History:  Diagnosis Date  . Arthritis    ? 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 Followed by Dr. 13/06/17  . CORONARY ARTERY DISEASE    a. cath 05/2014: mild LAD disease, normal LCx and RCA. Risk factor modification recommended.  06/2014 DIABETES MELLITUS, TYPE II   . Edema   . ERECTILE DYSFUNCTION, ORGANIC   . GERD   . Gout, unspecified   . HYPERLIPIDEMIA   . HYPERTENSION   . 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 Pericarditis    recurrent  . POLYNEUROPATHY   . PULMONARY SARCOIDOSIS    Mediastinal lymphadenopathy with biospy proven sarcodosis  . TESTICULAR HYPOFUNCTION   . TIA (transient ischemic attack)      Assessment: 45 yo M in ED with CP x 1 week. PMH of CM, EKG w/ ST elevations due to CM.   Pharmacy consulted to dose heparin for ACS/STEMI.  He says he does not take any blood thinners at home.  TBW 128 kg, HDW 112 kg. Creat WNL, CBC WNL, Trop neg x 1 istat.  Goal of Therapy:  Heparin level 0.3-0.7 units/ml Monitor platelets by anticoagulation protocol: Yes   Plan:  Give 4000 units bolus x 1 Start heparin infusion at 1500 units/hr Check anti-Xa level in 6 hours and daily while on heparin Continue to monitor H&H and platelets  55,  Pharm.D. Herby Abraham 04/13/2042 12:06 PM

## 2016-04-13 NOTE — Consult Note (Deleted)
Cardiology Consult    Patient ID: [name] MRN: [id number], DOB/AGE: 43-22-1972   Admit date: 04/13/2016 Date of Consult: 04/13/2016  Primary Physician: Sanda Linger, MD Reason for Consult: Chest pain Primary Cardiologist: Dr. Delton See Requesting Provider: Dr. Particia Nearing  Patient Profile    [name] is a 45 year old male with a past medical of CAD (nonobstructive by cath in 05/2014), HTN, HLD, DM, RA and pulmonary sarcoidosis. Presents with chest pain for the past week.   History of Present Illness    [name] starting having chest pain about a week ago, and was seen in our office on 04/06/16. He was scheduled for a [facility], but this was scheduled for 04/22/16.   His chest pain has gotten much worse over the weekend. He has stairs in his home and after climbing 4 of them he develops chest pain and SOB. Pain is located on the left side of his chest with radiation down his left arm. The pain lasts a few minutes and is relieved with SL Nitro. On Saturday night he was sleeping and was awakened with chest pain associated with SOB.   He was urged by his wife today to come in and seek evaluation for his chest pain. He has on Nitro paste and is currently pain free. His EKG shows NSR with diffuse ST elevation consistent with LVH and repolarization abnormalities. His EKG from December 2015 is similar to today's EKG. His initial troponin is negative.   He is a former smoker. His dad died from MI when he was 31.   Past Medical History   Past Medical History:  Diagnosis Date  . Arthritis    ? 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 Followed by Dr. Nickola Major  . CORONARY ARTERY DISEASE    a. cath 05/2014: mild LAD disease, normal LCx and RCA. Risk factor modification recommended.  Marland Kitchen DIABETES MELLITUS, TYPE II   . Edema   . ERECTILE DYSFUNCTION, ORGANIC   . GERD   . Gout, unspecified   . HYPERLIPIDEMIA   . HYPERTENSION   . 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 Pericarditis    recurrent  . POLYNEUROPATHY   . PULMONARY SARCOIDOSIS    Mediastinal lymphadenopathy with biospy proven sarcodosis  . TESTICULAR HYPOFUNCTION   . TIA (transient ischemic attack)     Past Surgical History:  Procedure Laterality Date  . BRONCHOSCOPY  08/21/08  . CARDIAC CATHETERIZATION  06/25/2009   minimal disease  . LEFT HEART CATHETERIZATION WITH CORONARY ANGIOGRAM N/A 05/18/2014   Procedure: LEFT HEART CATHETERIZATION WITH CORONARY ANGIOGRAM;  Surgeon: Corky Crafts, MD;  Location: [facility] CATH LAB;  Service: Cardiovascular;  Laterality: N/A;  . MEDIASTINOSCOPY  11/30/08     Allergies  Allergies  Allergen Reactions  . Sulfonamide Derivatives Rash and Other (See Comments)    Inpatient Medications      Family History    Family History  Problem Relation Age of Onset  . Diabetes Mother   . Hypertension Mother   . Diabetes Father   . Heart disease Father 46    Fatal MI  . Heart failure Father     Social History    Social History   Social History  . Marital status: Single    Spouse name: N/A  . Number of children: N/A  . Years of education: N/A   Occupational History  . Not on file.   Social History Main Topics  .  Smoking status: Former Smoker    Years: 15.00    Types: Cigarettes    Quit date: 07/09/2008  . Smokeless tobacco: Never Used  . Alcohol use 0.0 oz/week     Comment: occ  . Drug use: No  . Sexual activity: Not on file   Other Topics Concern  . Not on file   Social History Narrative   Married, lives with wife and 3 kids.    Currently student. 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  Cardiovascular:  + chest pain, dyspnea on exertion, edema, orthopnea, palpitations, paroxysmal nocturnal dyspnea. Dermatological: No rash, lesions/masses Respiratory: No cough, dyspnea Urologic: No hematuria,  dysuria Abdominal:   No nausea, vomiting, diarrhea, bright red blood per rectum, melena, or hematemesis Neurologic:  No visual changes, wkns, changes in mental status. All other systems reviewed and are otherwise negative except as noted above.  Physical Exam    Blood pressure 108/70, pulse 68, temperature 97.7 F (36.5 C), temperature source Oral, resp. rate 21, height 6\' 3"  (1.905 m), weight 282 lb (127.9 kg), SpO2 99 %.  General: Pleasant, NAD Psych: Normal affect. Neuro: Alert and oriented X 3. Moves all extremities spontaneously. HEENT: Normal  Neck: Supple without bruits or JVD. Lungs:  Resp regular and unlabored, CTA. Heart: RRR no s3, s4, or murmurs. Abdomen: Soft, non-tender, non-distended, BS + x 4.  Extremities: No clubbing, cyanosis or edema. DP/PT/Radials 2+ and equal bilaterally.  Labs    Troponin [facility] Test)  Recent Labs  04/13/16 0757  TROPIPOC 0.00    Lab Results  Component Value Date   WBC 5.5 04/13/2016   HGB 16.5 04/13/2016   HCT 45.8 04/13/2016   MCV 76.2 (L) 04/13/2016   PLT 261 04/13/2016    Recent Labs Lab 04/13/16 0744  NA 135  K 3.8  CL 101  CO2 26  BUN 5*  CREATININE 0.81  CALCIUM 8.9  GLUCOSE 150*   Lab Results  Component Value Date   CHOL 191 04/29/2015   HDL 45.00 04/29/2015   LDLCALC 122 (H) 04/29/2015   TRIG 120.0 04/29/2015     Radiology Studies    Dg Chest 2 View  Result Date: 04/13/2016 CLINICAL DATA:  Chest pain for 1 week with progression EXAM: CHEST  2 VIEW COMPARISON:  November 22, 2013 FINDINGS: There is no edema or consolidation. The heart size and pulmonary vascularity are normal. No adenopathy. There is degenerative change in the thoracic spine. IMPRESSION: No edema or consolidation. Electronically Signed   By: Bretta Bang III M.D.   On: 04/13/2016 08:52    EKG & Cardiac Imaging    EKG: NSR, ST elevation diffusely    Assessment & Plan    1. Chest pain: patient has had intermittent chest pain  at rest and with exertion. Pain has worsened over the weekend and has awakened him from sleep recently. Pain is relieved with SL Nitro.   He has risk factors for CAD including DM, former tobacco abuse and strong family history.   He had a heart cath in 2015 for the same symptoms and had normal cors. It is unlikely that he has developed CAD since his last cath. But in light of his symptoms with increased activity intolerance, classical anginal symptoms and strong family history he needs repeat coronary angiography.   His EKG is consistent with early repolarization  abnormality and has essentially looked the same since 2015. We will follow troponin results and if next set comes back positive he will need more urgent cardiac cath.   Start heparin gtt, continue Nitro paste. Will order Echo.   2. DM: Managed by his PCP. Last A1c was 7.2 6 months ago. He does use insulin.   3. HTN: Continue metoprolol.     Signed, Little Ishikawa, NP 04/13/2016, 11:33 AM Pager: [phone]

## 2016-04-14 ENCOUNTER — Encounter (HOSPITAL_COMMUNITY)
Admission: EM | Disposition: A | Discharge status: Home / Self Care | Payer: Self-pay | Source: Home / Self Care | Attending: Emergency Medicine

## 2016-04-14 ENCOUNTER — Encounter (HOSPITAL_COMMUNITY): Payer: Self-pay | Admitting: Cardiovascular Disease

## 2016-04-14 DIAGNOSIS — R079 Chest pain, unspecified: Secondary | ICD-10-CM

## 2016-04-14 DIAGNOSIS — R072 Precordial pain: Secondary | ICD-10-CM | POA: Diagnosis not present

## 2016-04-14 DIAGNOSIS — R0789 Other chest pain: Secondary | ICD-10-CM | POA: Diagnosis not present

## 2016-04-14 HISTORY — PX: CARDIAC CATHETERIZATION: SHX172

## 2016-04-14 LAB — GLUCOSE, CAPILLARY
Glucose-Capillary: 119 mg/dL — ABNORMAL HIGH (ref 65–99)
Glucose-Capillary: 166 mg/dL — ABNORMAL HIGH (ref 65–99)

## 2016-04-14 LAB — BASIC METABOLIC PANEL
Anion gap: 8 (ref 5–15)
BUN: 8 mg/dL (ref 6–20)
CO2: 29 mmol/L (ref 22–32)
Calcium: 8.9 mg/dL (ref 8.9–10.3)
Chloride: 101 mmol/L (ref 101–111)
Creatinine, Ser: 0.93 mg/dL (ref 0.61–1.24)
GFR calc Af Amer: >60 mL/min (ref >60)
GFR calc non Af Amer: >60 mL/min (ref >60)
Glucose, Bld: 168 mg/dL — ABNORMAL HIGH (ref 65–99)
Potassium: 3.5 mmol/L (ref 3.5–5.1)
Sodium: 138 mmol/L (ref 135–145)

## 2016-04-14 LAB — CBC
HCT: 43.5 % (ref 39.0–52.0)
Hemoglobin: 15.4 g/dL (ref 13.0–17.0)
MCH: 27.2 pg (ref 26.0–34.0)
MCHC: 35.4 g/dL (ref 30.0–36.0)
MCV: 76.9 fL — ABNORMAL LOW (ref 78.0–100.0)
Platelets: 238 10*3/uL (ref 150–400)
RBC: 5.66 MIL/uL (ref 4.22–5.81)
RDW: 13.8 % (ref 11.5–15.5)
WBC: 7.6 10*3/uL (ref 4.0–10.5)

## 2016-04-14 LAB — LIPID PANEL
Cholesterol: 162 mg/dL (ref 0–200)
HDL: 33 mg/dL — ABNORMAL LOW (ref >40)
LDL Cholesterol: 100 mg/dL — ABNORMAL HIGH (ref 0–99)
Total CHOL/HDL Ratio: 4.9 RATIO
Triglycerides: 147 mg/dL (ref <150)
VLDL: 29 mg/dL (ref 0–40)

## 2016-04-14 LAB — HEMOGLOBIN A1C
Hgb A1c MFr Bld: 7.1 % — ABNORMAL HIGH (ref 4.8–5.6)
Mean Plasma Glucose: 157 mg/dL

## 2016-04-14 LAB — PROTIME-INR
INR: 0.98
Prothrombin Time: 13 seconds (ref 11.4–15.2)

## 2016-04-14 LAB — POCT ACTIVATED CLOTTING TIME: Activated Clotting Time: 147 seconds

## 2016-04-14 LAB — TSH: TSH: 1.102 u[IU]/mL (ref 0.350–4.500)

## 2016-04-14 LAB — HEPARIN LEVEL (UNFRACTIONATED): Heparin Unfractionated: 0.28 IU/mL — ABNORMAL LOW (ref 0.30–0.70)

## 2016-04-14 SURGERY — LEFT HEART CATH AND CORONARY ANGIOGRAPHY

## 2016-04-14 MED ORDER — FREESTYLE LANCETS MISC: 12 refills | Status: DC

## 2016-04-14 MED ORDER — MIDAZOLAM HCL 2 MG/2ML IJ SOLN
INTRAMUSCULAR | Status: AC
Start: 2016-04-14 — End: 2016-04-14
  Filled 2016-04-14: qty 2

## 2016-04-14 MED ORDER — LIDOCAINE HCL (PF) 1 % IJ SOLN
INTRAMUSCULAR | Status: AC
  Filled 2016-04-14: qty 30

## 2016-04-14 MED ORDER — HEPARIN (PORCINE) IN NACL 2-0.9 UNIT/ML-% IJ SOLN
INTRAMUSCULAR | Status: AC
  Filled 2016-04-14: qty 1000

## 2016-04-14 MED ORDER — ASPIRIN 81 MG PO CHEW
81.0000 mg | CHEWABLE_TABLET | ORAL | Status: AC
  Administered 2016-04-14: 81 mg via ORAL
  Filled 2016-04-14: qty 1

## 2016-04-14 MED ORDER — SODIUM CHLORIDE 0.9% FLUSH: 3.0000 mL | Freq: Two times a day (BID) | INTRAVENOUS | Status: DC

## 2016-04-14 MED ORDER — HEPARIN (PORCINE) IN NACL 2-0.9 UNIT/ML-% IJ SOLN
INTRAMUSCULAR | Status: DC | PRN
  Administered 2016-04-14: 1000 mL

## 2016-04-14 MED ORDER — MIDAZOLAM HCL 2 MG/2ML IJ SOLN
INTRAMUSCULAR | Status: AC
  Filled 2016-04-14: qty 2

## 2016-04-14 MED ORDER — FENTANYL CITRATE (PF) 100 MCG/2ML IJ SOLN
INTRAMUSCULAR | Status: DC | PRN
  Administered 2016-04-14: 25 ug via INTRAVENOUS
  Administered 2016-04-14: 50 ug via INTRAVENOUS

## 2016-04-14 MED ORDER — SODIUM CHLORIDE 0.9% FLUSH: 3.0000 mL | INTRAVENOUS | Status: DC | PRN

## 2016-04-14 MED ORDER — SODIUM CHLORIDE 0.9 % IV SOLN
INTRAVENOUS | Status: DC
  Administered 2016-04-14: 05:00:00 via INTRAVENOUS

## 2016-04-14 MED ORDER — FENTANYL CITRATE (PF) 100 MCG/2ML IJ SOLN
INTRAMUSCULAR | Status: AC
  Filled 2016-04-14: qty 2

## 2016-04-14 MED ORDER — SODIUM CHLORIDE 0.9 % IV SOLN: 250.0000 mL | INTRAVENOUS | Status: DC | PRN

## 2016-04-14 MED ORDER — ASPIRIN EC 81 MG PO TBEC: 81.0000 mg | DELAYED_RELEASE_TABLET | Freq: Every day | ORAL | Status: DC

## 2016-04-14 MED ORDER — IOPAMIDOL (ISOVUE-370) INJECTION 76%
INTRAVENOUS | Status: DC | PRN
  Administered 2016-04-14: 60 mL via INTRAVENOUS

## 2016-04-14 MED ORDER — SODIUM CHLORIDE 0.9 % IV SOLN: INTRAVENOUS | Status: AC

## 2016-04-14 MED ORDER — ATORVASTATIN CALCIUM 40 MG PO TABS: 40.0000 mg | ORAL_TABLET | Freq: Every day | ORAL | 12 refills | Status: DC

## 2016-04-14 MED ORDER — GLUCOSE BLOOD VI STRP: ORAL_STRIP | 12 refills | Status: DC

## 2016-04-14 MED ORDER — IOPAMIDOL (ISOVUE-370) INJECTION 76%
INTRAVENOUS | Status: AC
  Filled 2016-04-14: qty 100

## 2016-04-14 MED ORDER — FREESTYLE SYSTEM KIT: 1.0000 | PACK | 0 refills | Status: DC | PRN

## 2016-04-14 MED ORDER — MIDAZOLAM HCL 2 MG/2ML IJ SOLN
INTRAMUSCULAR | Status: DC | PRN
  Administered 2016-04-14: 2 mg via INTRAVENOUS
  Administered 2016-04-14: 1 mg via INTRAVENOUS

## 2016-04-14 MED ORDER — LIDOCAINE HCL (PF) 1 % IJ SOLN
INTRAMUSCULAR | Status: DC | PRN
  Administered 2016-04-14: 15 mL

## 2016-04-14 SURGICAL SUPPLY — 8 items
CATH INFINITI MULTIPACK ST 5F (CATHETERS) ×2 IMPLANT
GUIDEWIRE 3MM J TIP .035 145 (WIRE) ×2 IMPLANT
HOVERMATT SINGLE USE (MISCELLANEOUS) ×2 IMPLANT
KIT HEART LEFT (KITS) ×2 IMPLANT
PACK CARDIAC CATHETERIZATION (CUSTOM PROCEDURE TRAY) ×2 IMPLANT
SHEATH PINNACLE 5F 10CM (SHEATH) ×2 IMPLANT
TRANSDUCER W/STOPCOCK (MISCELLANEOUS) ×2 IMPLANT
TUBING CIL FLEX 10 FLL-RA (TUBING) IMPLANT

## 2016-04-14 NOTE — Care Management Obs Status (Signed)
MEDICARE OBSERVATION STATUS NOTIFICATION   Patient Details  Name: VERGIL BURBY MRN: [id number] Date of Birth: [birth year]   Medicare Observation Status Notification Given:       Elliot Cousin, RN 04/14/2016, 2:25 PM

## 2016-04-14 NOTE — Care Management Obs Status (Signed)
MEDICARE OBSERVATION STATUS NOTIFICATION   Patient Details  Name: GEFFREY MICHAELSEN MRN: [id number] Date of Birth: 1971/03/08   Medicare Observation Status Notification Given:  Yes    Elliot Cousin, RN 04/14/2016, 3:30 PM

## 2016-04-14 NOTE — Progress Notes (Signed)
    Pt's cath showed minimal CAD He is feeling better  troponins are negative  He will be discharged this evening . We can cancel the OP stress myoview ( since he has already had the cath)   Will still get the echo for complete evaluation  Follow up with Dr. Delton See in several months      Kristeen Miss, MD  04/14/2016 1:18 PM    [facility] [address] [place],  [address] [place], [place]  [address] Pager [phone] Phone: [phone]; Fax: [phone]

## 2016-04-14 NOTE — Care Management Note (Signed)
Case Management Note  Patient Details  Name: [name] MRN: [id number] Date of Birth: Sep 09, 1970  Subjective/Objective:   Chest pain, DM, HTN                 Action/Plan: Discharge Planning:  NCM spoke to pt and girlfriend at bedside. Pt states he takes his medications as prescribed. Has follow up with Endocrinologist this month. States he does not check blood sugars at home. Request sent to attending for Rx for Glucometer. Pt states he has an good exercise program and routine.   PCP [name] MD  Expected Discharge Date:  04/14/2016             Expected Discharge Plan:  Home/Self Care  In-House Referral:  NA  Discharge planning Services  CM Consult  Post Acute Care Choice:  NA Choice offered to:  NA  DME Arranged:  N/A DME Agency:  NA  HH Arranged:  NA HH Agency:  NA  Status of Service:  Completed, signed off  If discussed at Long Length of Stay Meetings, dates discussed:    Additional Comments:  Elliot Cousin, RN 04/14/2016, 2:32 PM

## 2016-04-14 NOTE — Interval H&P Note (Signed)
History and Physical Interval Note:  04/14/2016 8:35 AM  [name]  has presented today for cardiac cath with the diagnosis of chest pain. The various methods of treatment have been discussed with the patient and family. After consideration of risks, benefits and other options for treatment, the patient has consented to  Procedure(s): Left Heart Cath and Coronary Angiography (N/A) as a surgical intervention .  The patient's history has been reviewed, patient examined, no change in status, stable for surgery.  I have reviewed the patient's chart and labs.  Questions were answered to the patient's satisfaction.    Cath Lab Visit (complete for each Cath Lab visit)  Clinical Evaluation Leading to the Procedure:   ACS: No.  Non-ACS:    Anginal Classification: CCS II  Anti-ischemic medical therapy: Minimal Therapy (1 class of medications)  Non-Invasive Test Results: No non-invasive testing performed  Prior CABG: No previous CABG         Verne Carrow

## 2016-04-14 NOTE — Discharge Summary (Signed)
Discharge Summary    Patient ID: [name],  MRN: [id number], DOB/AGE: 1970-06-16 45 y.o.  Admit date: 04/13/2016 Discharge date: 04/14/2016  Primary Care Provider: Scarlette Calico Primary Cardiologist: Dr. Meda Coffee  Discharge Diagnoses    Active Problems:   Unstable angina [facility])   Chest pain   Allergies Allergies  Allergen Reactions  . Sulfonamide Derivatives Rash and Other (See Comments)    Diagnostic Studies/Procedures  Left Heart Cath and Coronary Angiography 04/14/16   Prox LAD to Dist LAD lesion, 20 %stenosed.   1. Mild non-obstructive CAD 2. Elevation LV filling pressures 3. Non-cardiac chest pain  Recommendations: Medical management of mild CAD.    _____________   History of Present Illness   Mr. [name] starting having chest pain about a week ago, and was seen in our office on 04/06/16. He was scheduled for a [facility], but this was scheduled for 04/22/16.   His chest pain has gotten much worse over the weekend. He has stairs in his home and after climbing 4 of them he develops chest pain and SOB. Pain is located on the left side of his chest with radiation down his left arm. The pain lasts a few minutes and is relieved with SL Nitro. On Saturday night (04/10/16) he was sleeping and was awakened with chest pain associated with SOB.   He was urged by his wife  to come in and seek evaluation for his chest pain. He presented to the ED on 04/13/16. He has on Nitro paste and is currently pain free. His EKG shows NSR with diffuse ST elevation consistent with LVH and repolarization abnormalities. His EKG from December 2015 is similar to today's EKG. His initial troponin is negative.   He is a former smoker. His dad died from MI when he was 48.     Hospital Course     Troponin remained negative. He had normal cors by cath. He was stable post procedure. He requested that we use his groin for access rather than his radial because he plays the piano and  does not want this to interfere.   He has ambulated without chest pain. Groin site is a level zero.  He was seen today by Dr. Acie Fredrickson and deemed suitable for discharge.  _____________  Discharge Vitals Blood pressure (!) 146/94, pulse 72, temperature 98.2 F (36.8 C), temperature source Oral, resp. rate 20, height _0  (1.905 m), weight 276 lb 10.8 oz (125.5 kg), SpO2 100 %.  Filed Weights   04/13/16 0740 04/13/16 1544  Weight: 282 lb (127.9 kg) 276 lb 10.8 oz (125.5 kg)    Labs & Radiologic Studies     CBC  Recent Labs  04/13/16 0744 04/14/16 0302  WBC 5.5 7.6  HGB 16.5 15.4  HCT 45.8 43.5  MCV 76.2* 76.9*  PLT 261 675   Basic Metabolic Panel  Recent Labs  04/13/16 0744 04/14/16 0302  NA 135 138  K 3.8 3.5  CL 101 101  CO2 26 29  GLUCOSE 150* 168*  BUN 5* 8  CREATININE 0.81 0.93  CALCIUM 8.9 8.9   Cardiac Enzymes  Recent Labs  04/13/16 1141  TROPONINI <0.03   Fasting Lipid Panel  Recent Labs  04/14/16 0302  CHOL 162  HDL 33*  LDLCALC 100*  TRIG 147  CHOLHDL 4.9   Thyroid Function Tests  Recent Labs  04/14/16 0302  TSH 1.102    Dg Chest 2 View  Result Date: 04/13/2016 CLINICAL DATA:  Chest pain for 1 week with progression EXAM: CHEST  2 VIEW COMPARISON:  November 22, 2013 FINDINGS: There is no edema or consolidation. The heart size and pulmonary vascularity are normal. No adenopathy. There is degenerative change in the thoracic spine. IMPRESSION: No edema or consolidation. Electronically Signed   By: Lowella Grip III M.D.   On: 04/13/2016 08:52    Disposition   Pt is being discharged home today in good condition.  Follow-up Plans & Appointments   Follow up with Dr. Meda Coffee as needed.   Discharge Instructions    Diet - low sodium heart healthy    Complete by:  As directed    Discharge instructions    Complete by:  As directed    Do not take Metformin today or tomorrow, may resume your Metformin on 04/16/16 in the morning.     Groin Site Care Refer to this sheet in the next few weeks. These instructions provide you with information on caring for yourself after your procedure. Your caregiver may also give you more specific instructions. Your treatment has been planned according to current medical practices, but problems sometimes occur. Call your caregiver if you have any problems or questions after your procedure. HOME CARE INSTRUCTIONS You may shower 24 hours after the procedure. Remove the bandage (dressing) and gently wash the site with plain soap and water. Gently pat the site dry.  Do not apply powder or lotion to the site.  Do not sit in a bathtub, swimming pool, or whirlpool for 5 to 7 days.  No bending, squatting, or lifting anything over 10 pounds (4.5 kg) as directed by your caregiver.  Inspect the site at least twice daily.  Do not drive home if you are discharged the same day of the procedure. Have someone else drive you.  You may drive 24 hours after the procedure unless otherwise instructed by your caregiver.  What to expect: Any bruising will usually fade within 1 to 2 weeks.  Blood that collects in the tissue (hematoma) may be painful to the touch. It should usually decrease in size and tenderness within 1 to 2 weeks.  SEEK IMMEDIATE MEDICAL CARE IF: You have unusual pain at the groin site or down the affected leg.  You have redness, warmth, swelling, or pain at the groin site.  You have drainage (other than a small amount of blood on the dressing).  You have chills.  You have a fever or persistent symptoms for more than 72 hours.  You have a fever and your symptoms suddenly get worse.  Your leg becomes pale, cool, tingly, or numb.  You have heavy bleeding from the site. Hold pressure on the site. .   Increase activity slowly    Complete by:  As directed       Discharge Medications   Current Discharge Medication List    START taking these medications   Details  atorvastatin (LIPITOR)  40 MG tablet Take 1 tablet (40 mg total) by mouth daily at 6 PM. Qty: 30 tablet, Refills: 12    !! glucose blood (FREESTYLE TEST STRIPS) test strip Use as instructed Qty: 100 each, Refills: 12    glucose monitoring kit (FREESTYLE) monitoring kit 1 each by Does not apply route as needed for other. Qty: 1 each, Refills: 0    Lancets (FREESTYLE) lancets Use as instructed Qty: 100 each, Refills: 12     !! - Potential duplicate medications found. Please discuss with provider.    CONTINUE these  medications which have NOT CHANGED   Details  albuterol (PROVENTIL HFA;VENTOLIN HFA) 108 (90 Base) MCG/ACT inhaler Inhale 2 puffs into the lungs every 6 (six) hours as needed for wheezing or shortness of breath. Qty: 1 Inhaler, Refills: 11   Associated Diagnoses: Exercise-induced asthma    allopurinol (ZYLOPRIM) 300 MG tablet Take 1 tablet (300 mg total) by mouth daily. Qty: 90 tablet, Refills: 3    aspirin EC 81 MG tablet Take 1 tablet (81 mg total) by mouth daily. Qty: 30 tablet, Refills: 3    cholecalciferol (VITAMIN D) 1000 units tablet Take 1,000 Units by mouth daily.    furosemide (LASIX) 40 MG tablet Take 1 tablet (40 mg total) by mouth daily. Qty: 90 tablet, Refills: 1    !! glucose blood (ONE TOUCH ULTRA TEST) test strip 1 each by Other route daily. Use as instructed Qty: 100 each, Refills: 1    metFORMIN (GLUCOPHAGE-XR) 500 MG 24 hr tablet Take 1 tablet (500 mg total) by mouth 2 (two) times daily with a meal. Qty: 180 tablet, Refills: 1    metoprolol (LOPRESSOR) 50 MG tablet Take 1 tablet (50 mg total) by mouth 2 (two) times daily. Qty: 180 tablet, Refills: 0    nitroGLYCERIN (NITROSTAT) 0.4 MG SL tablet Place 1 tablet (0.4 mg total) under the tongue every 5 (five) minutes as needed for chest pain. Qty: 90 tablet, Refills: 3    potassium chloride SA (KLOR-CON M20) 20 MEQ tablet Take 1 tablet (20 mEq total) by mouth 2 (two) times daily. Qty: 180 tablet, Refills: 1    saw  palmetto 160 MG capsule Take 160 mg by mouth daily.    tadalafil (CIALIS) 20 MG tablet Take 0.5-1 tablets (10-20 mg total) by mouth every 3 (three) days as needed for erectile dysfunction. Qty: 10 tablet, Refills: 11   Associated Diagnoses: Drug-induced erectile dysfunction    testosterone cypionate (DEPOTESTOSTERONE CYPIONATE) 200 MG/ML injection Inject 1 mL (200 mg total) into the muscle every 14 (fourteen) days. Qty: 10 mL, Refills: 0   Associated Diagnoses: Hypogonadism in male     !! - Potential duplicate medications found. Please discuss with provider.          Outstanding Labs/Studies    Duration of Discharge Encounter   Greater than 30 minutes including physician time.  Signed, Arbutus Leas NP 04/14/2016, 2:38 PM   Attending Note:   The patient was seen and examined.  Agree with assessment and plan as noted above.  Changes made to the above note as needed.  Patient seen and independently examined with Jettie Booze , NP .   We discussed all aspects of the encounter. I agree with the assessment and plan as stated above.  1. CP .   Cath did not show significant CAD Patient is stable for DC    I have spent a total of 40 minutes with patient reviewing hospital  notes , telemetry, EKGs, labs and examining patient as well as establishing an assessment and plan that was discussed with the patient. > 50% of time was spent in direct patient care.    Thayer Headings, Brooke Bonito., MD, [facility] 04/15/2016, 6:39 PM [address]  [place] Pager [phone]

## 2016-04-14 NOTE — Progress Notes (Signed)
ANTICOAGULATION CONSULT NOTE - Follow-up Consult  Pharmacy Consult for heparin Indication: chest pain/ACS  Allergies  Allergen Reactions  . Sulfonamide Derivatives Rash and Other (See Comments)    Patient Measurements: Height: 6\' 3"  (190.5 cm) Weight: 276 lb 10.8 oz (125.5 kg) IBW/kg (Calculated) : 84.5 Heparin Dosing Weight: 112 kg  Vital Signs: Temp: 98.2 F (36.8 C) (11/07 0436) Temp Source: Oral (11/07 0436) BP: 134/81 (11/07 0436) Pulse Rate: 77 (11/07 0436)  Labs:  Recent Labs  04/13/16 0744 04/13/16 1141 04/13/16 1843 04/14/16 0302  HGB 16.5  --   --  15.4  HCT 45.8  --   --  43.5  PLT 261  --   --  238  HEPARINUNFRC  --   --  0.31 0.28*  CREATININE 0.81  --   --  0.93  TROPONINI  --  <0.03  --   --    Estimated Creatinine Clearance: 146.2 mL/min (by C-G formula based on SCr of 0.93 mg/dL).  Assessment: 45 yo M on heparin for ACS/STEMI. Heparin level down to slightly subtherapeutic (0.28) on gtt at 1600 units/hr. No issues with line or bleeding reported per RN. CBC stable.  Goal of Therapy:  Heparin level 0.3-0.7 units/ml Monitor platelets by anticoagulation protocol: Yes   Plan:  Increase heparin infusion to 1900 units/hr F/u 6 hr heparin level  55, PharmD, BCPS Clinical pharmacist, pager [phone] 04/14/2016 4:41 AM

## 2016-04-14 NOTE — Progress Notes (Signed)
Site area: Right groin 5 french arterial sheath was removed by Earnest Rosier RN  Site Prior to Removal:  Level 0  Pressure Applied For 20 MINUTES    Bedrest Beginning at 1040am  Manual:   Yes.    Patient Status During Pull:  stable  Post Pull Groin Site:  Level 0  Post Pull Instructions Given:  Yes.    Post Pull Pulses Present:  Yes.    Dressing Applied:  Yes.    Comments:  VS remain stable during sheath pull

## 2016-04-14 NOTE — Care Management CC44 (Signed)
Condition Code 44 Documentation Completed  Patient Details  Name: [name] MRN: [id number] Date of Birth: 20-Jul-1970   Condition Code 44 given:  yes Patient signature on Condition Code 44 notice:  yes Documentation of 2 MD's agreement:  yes Code 44 added to claim:  yes    Elliot Cousin, RN 04/14/2016, 2:25 PM

## 2016-04-15 ENCOUNTER — Telehealth: Payer: Self-pay | Admitting: Cardiology

## 2016-04-15 MED ORDER — AMLODIPINE BESYLATE 5 MG PO TABS: 5.0000 mg | ORAL_TABLET | Freq: Every day | ORAL | 0 refills | Status: DC

## 2016-04-15 NOTE — Telephone Encounter (Signed)
Left a message for the pt to call back.  Pt was last seen on 10/31 by [place] PA-C.  May need to defer call back to her assistant, in regards to last OV with [place] on 10/31.

## 2016-04-15 NOTE — Telephone Encounter (Signed)
Pt states at time of office visit 04/07/16 he was taking both HCTZ and lasix.  Pt states HCTZ was discontinued at that office visit, he is currently taking lasix 40mg  daily and metoprolol 50 mg bid. Pt states this morning when he checked his BP and it was 154/101 he had taken his medication, he  went ahead and took an additional metoprolol 50mg  in addition to his usual dose of metoprolol 50mg .  Pt states his BP was okay at hospital yesterday because he had on a NTG patch but when patch was taken off and his was discharged his BP was high last night  Per Simmons--start amlodipine 5mg  daily, schedule follow up appointment in 10-14 days with pharmacist in [facility].  Pt advised,verbalized understanding, will forward to [facility] to arrange appt in [facility].

## 2016-04-15 NOTE — Telephone Encounter (Signed)
Pt states BP this AM 154/101, 158/104,heart rate 80. Pt states BP 147/101, heart rate 82.

## 2016-04-15 NOTE — Telephone Encounter (Signed)
Follow up      Pt saw the PA on 04-07-16.  He had a cath on 04-13-16 which was normal.  Pt want to know if he will still need to have his stress test. Also, pt states his bp is 158/104.  He doubled his metoprolol and it came down to 147/90.  Please advise.

## 2016-04-15 NOTE — Telephone Encounter (Signed)
Per Brittany--start amlodipine 5mg  daily, scheduled follow up appointment 10-14 days with pharmacist in [facility].

## 2016-04-15 NOTE — Telephone Encounter (Signed)
New message  Pt call requesting to speak with Rn about last office visit. Please call back to discuss

## 2016-04-16 NOTE — Telephone Encounter (Signed)
[facility].ON 04/2016,PATIENT IS AWARE AND APPOINT MAILED

## 2016-04-22 ENCOUNTER — Ambulatory Visit (HOSPITAL_COMMUNITY): Payer: Self-pay

## 2016-04-23 ENCOUNTER — Ambulatory Visit (HOSPITAL_COMMUNITY): Payer: Self-pay

## 2016-04-23 ENCOUNTER — Encounter: Payer: Self-pay | Admitting: Adult Health

## 2016-04-23 ENCOUNTER — Ambulatory Visit (INDEPENDENT_AMBULATORY_CARE_PROVIDER_SITE_OTHER): Payer: Medicare Other | Admitting: General Practice

## 2016-04-23 ENCOUNTER — Ambulatory Visit (INDEPENDENT_AMBULATORY_CARE_PROVIDER_SITE_OTHER): Payer: Medicare Other | Admitting: Adult Health

## 2016-04-23 ENCOUNTER — Other Ambulatory Visit: Payer: Self-pay | Admitting: General Practice

## 2016-04-23 VITALS — BP 108/76 | HR 73 | Temp 98.2°F | Ht 75.0 in | Wt 278.0 lb

## 2016-04-23 DIAGNOSIS — D869 Sarcoidosis, unspecified: Secondary | ICD-10-CM | POA: Diagnosis not present

## 2016-04-23 DIAGNOSIS — E291 Testicular hypofunction: Secondary | ICD-10-CM | POA: Diagnosis not present

## 2016-04-23 MED ORDER — PREDNISONE 10 MG PO TABS: ORAL_TABLET | ORAL | 0 refills | Status: DC

## 2016-04-23 MED ORDER — TESTOSTERONE CYPIONATE 200 MG/ML IM SOLN: 200.0000 mg | INTRAMUSCULAR | Status: DC

## 2016-04-23 NOTE — Assessment & Plan Note (Signed)
Pt education on steroid and bs To call if bs > 250 .

## 2016-04-23 NOTE — Assessment & Plan Note (Signed)
Probable sarcoid flare  Need PFT -pending these results may need CT chest  Recent CXR ok.  Steroid challenge. If still sx will need to consider restarting methotrexate with rheumatology   Plan Patient Instructions  Prednisone 40mg  daily for 5 days then 20mg  daily for 1 week then 10mg  daily for 1 week then 5mg  daily for 1 week then stop .  Follow up with Dr.  In 4 weeks with PFT  And As needed   Please contact office for sooner follow up if symptoms do not improve or worsen or seek emergency care

## 2016-04-23 NOTE — Progress Notes (Signed)
Reviewed and agree with assessment/plan.  [name], MD [facility] Pulmonary/Critical Care 04/23/2016, 4:22 PM Pager:  [phone]

## 2016-04-23 NOTE — Progress Notes (Signed)
Subjective:    Patient ID: [name], male    DOB: 10-11-1970, 45 y.o.   MRN: [id number]  HPI  45 yo male followed for Sarcoid w/ pulmonary /neuro/joint involvement , and mild persistent asthma.   TEST  Spirometry 01/14/11 >> FEV1 3.15 (78%), FVC 3.68 (72%), FEV1% 86  Spirometry 12/09/11 >> FEV1 3.25 (80%), FVC 3.85 (76%), FEV1% 84   04/23/2016 Acute OV : Sarcoid  Last seen in office 3.5 yr ago. Presents today for Sarcoid flare . Complains of joint pain , chest tightness, and increased sob, and dry cough for last couple of months. Worse for last 2 weeks.  Previously on MTX thru Rheumatology. Says he ran out of refills and Rheumatologist would not refill. Marland Kitchen Has been off for over 1-2 years ago.  Was admitted 04/13/16 with chest pain , underwent cardiac cath that showed mild non obstructive CAD. CXR showed nad. Nml H/H.  He denies fever, discolored mucus, orthopena, edema or hemotpysis. Occasional rash but nothing right now.    Past Medical History:  Diagnosis Date  . Anginal pain ([place])   . Arthritis    ? 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 Followed by Dr. Trudie Reed  . Arthritis    "ankles" (04/13/2016)  . Asthma   . CORONARY ARTERY DISEASE    a. cath 05/2014: mild LAD disease, normal LCx and RCA. Risk factor modification recommended.  Marland Kitchen DIABETES MELLITUS, TYPE II   . Edema   . ERECTILE DYSFUNCTION, ORGANIC   . GERD   . Gout, unspecified   . Heart murmur   . HYPERLIPIDEMIA   . HYPERTENSION   . 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 Pericarditis    recurrent  . POLYNEUROPATHY   . PULMONARY SARCOIDOSIS    Mediastinal lymphadenopathy with biospy proven sarcodosis  . Seizures ([place])    "none in 4-5 years; don't know what kind; not related to alcohol" (04/13/2016)  . TESTICULAR HYPOFUNCTION   . TIA (transient ischemic attack)    "I don't remember when" (04/13/2016)   Current Outpatient Prescriptions on File Prior to Visit    Medication Sig Dispense Refill  . albuterol (PROVENTIL HFA;VENTOLIN HFA) 108 (90 Base) MCG/ACT inhaler Inhale 2 puffs into the lungs every 6 (six) hours as needed for wheezing or shortness of breath. 1 Inhaler 11  . allopurinol (ZYLOPRIM) 300 MG tablet Take 1 tablet (300 mg total) by mouth daily. 90 tablet 3  . amLODipine (NORVASC) 5 MG tablet Take 1 tablet (5 mg total) by mouth daily. 90 tablet 0  . aspirin EC 81 MG tablet Take 1 tablet (81 mg total) by mouth daily. 30 tablet 3  . atorvastatin (LIPITOR) 40 MG tablet Take 1 tablet (40 mg total) by mouth daily at 6 PM. 30 tablet 12  . cholecalciferol (VITAMIN D) 1000 units tablet Take 1,000 Units by mouth daily.    . furosemide (LASIX) 40 MG tablet Take 1 tablet (40 mg total) by mouth daily. 90 tablet 1  . glucose blood (FREESTYLE TEST STRIPS) test strip Use as instructed 100 each 12  . glucose blood (ONE TOUCH ULTRA TEST) test strip 1 each by Other route daily. Use as instructed 100 each 1  . glucose monitoring kit (FREESTYLE) monitoring kit 1 each by Does not apply route as needed for other. 1 each 0  . Lancets (FREESTYLE) lancets Use as instructed 100 each 12  . metFORMIN (GLUCOPHAGE-XR) 500 MG 24 hr tablet Take 1  tablet (500 mg total) by mouth 2 (two) times daily with a meal. 180 tablet 1  . metoprolol (LOPRESSOR) 50 MG tablet Take 1 tablet (50 mg total) by mouth 2 (two) times daily. 180 tablet 0  . nitroGLYCERIN (NITROSTAT) 0.4 MG SL tablet Place 1 tablet (0.4 mg total) under the tongue every 5 (five) minutes as needed for chest pain. 90 tablet 3  . potassium chloride SA (KLOR-CON M20) 20 MEQ tablet Take 1 tablet (20 mEq total) by mouth 2 (two) times daily. 180 tablet 1  . saw palmetto 160 MG capsule Take 160 mg by mouth daily.    . tadalafil (CIALIS) 20 MG tablet Take 0.5-1 tablets (10-20 mg total) by mouth every 3 (three) days as needed for erectile dysfunction. 10 tablet 11  . testosterone cypionate (DEPOTESTOSTERONE CYPIONATE) 200 MG/ML  injection Inject 1 mL (200 mg total) into the muscle every 14 (fourteen) days. 10 mL 0   Current Facility-Administered Medications on File Prior to Visit  Medication Dose Route Frequency Provider Last Rate Last Dose  . testosterone cypionate (DEPOTESTOSTERONE CYPIONATE) injection 200 mg  200 mg Intramuscular Q14 Days Cassandria Anger, MD   200 mg at 04/23/16 1433      Review of Systems  Constitutional:   No  weight loss, night sweats,  Fevers, chills,  +fatigue, or  lassitude.  HEENT:   No headaches,  Difficulty swallowing,  Tooth/dental problems, or  Sore throat,                No sneezing, itching, ear ache, nasal congestion, post nasal drip,   CV:  No chest pain,  Orthopnea, PND, swelling in lower extremities, anasarca, dizziness, palpitations, syncope.   GI  No heartburn, indigestion, abdominal pain, nausea, vomiting, diarrhea, change in bowel habits, loss of appetite, bloody stools.   Resp:    No chest wall deformity  Skin: no rash or lesions.  GU: no dysuria, change in color of urine, no urgency or frequency.  No flank pain, no hematuria   MS:  No joint pain or swelling.  No decreased range of motion.  No back pain.  Psych:  No change in mood or affect. No depression or anxiety.  No memory loss.         Objective:   Physical Exam Vitals:   04/23/16 1537  BP: 108/76  Pulse: 73  Temp: 98.2 F (36.8 C)  TempSrc: Oral  SpO2: 98%  Weight: 278 lb (126.1 kg)  Height: '6\' 3"'$  (1.905 m)   GEN: A/Ox3; pleasant , NAD, well nourished    HEENT:  [place]/AT,  EACs-clear, TMs-wnl, NOSE-clear, THROAT-clear, no lesions, no postnasal drip or exudate noted.   NECK:  Supple w/ fair ROM; no JVD; normal carotid impulses w/o bruits; no thyromegaly or nodules palpated; no lymphadenopathy.    RESP  Clear  P & A; w/o, wheezes/ rales/ or rhonchi. no accessory muscle use, no dullness to percussion  CARD:  RRR, no m/r/g  , no peripheral edema, pulses intact, no cyanosis or  clubbing.  GI:   Soft & nt; nml bowel sounds; no organomegaly or masses detected.   Musco: Warm bil, no deformities or joint swelling noted.   Neuro: alert, no focal deficits noted.    Skin: Warm, no lesions or rashes  Brecklynn Jian NP-C  [facility]  04/23/2016        Assessment & Plan:

## 2016-04-23 NOTE — Patient Instructions (Signed)
Prednisone 40mg  daily for 5 days then 20mg  daily for 1 week then 10mg  daily for 1 week then 5mg  daily for 1 week then stop .  Follow up with Dr.  In 4 weeks with PFT  And As needed   Please contact office for sooner follow up if symptoms do not improve or worsen or seek emergency care

## 2016-04-27 ENCOUNTER — Ambulatory Visit (INDEPENDENT_AMBULATORY_CARE_PROVIDER_SITE_OTHER): Payer: Medicare Other | Admitting: Pharmacist

## 2016-04-27 ENCOUNTER — Encounter: Payer: Self-pay | Admitting: Pharmacist

## 2016-04-27 VITALS — BP 140/92 | HR 76

## 2016-04-27 DIAGNOSIS — I10 Essential (primary) hypertension: Secondary | ICD-10-CM | POA: Diagnosis not present

## 2016-04-27 NOTE — Progress Notes (Signed)
Patient ID: ERCELL [name]                 DOB: November 10, 1970                      MRN: [id number]     HPI: [name] is a 45 y.o. male patient of Dr. Meda Coffee referred by to HTN clinic. PMH is significant for CAD (nonobstructive disease by cath in 05/2014), HTN, HLD, DM2, RA, edema and chronic pulmonary sarcoidosis. At last OV on 10/31, his BP was 130/84 mmHg on Lasix, Lopressor and HCTZ. His HCTZ was d/c'd at that visit due to medication duplication with the Lasix. Patient currently on prednisone taper for next 3 weeks for chronic sarcoidosis exacerbation.  Pt denies SOB, blurred vision, dizziness and faintness, but does endorse CP due to CAD. Pt was continued on the Lasix versus HCTZ due to ankle swelling in the past. He denies ankle swelling since starting the amlodipine, but says he had some ankle swelling last night which may be due to current prednisone therapy.   Current HTN meds: amlodipine 5 mg daily, lasix 40 mg daily, metoprolol 50 mg BID, nitroglycerine 0.4 mg SL prn Previously tried: HCTZ 25 mg daily (d/c'd b/c taking Lasix) BP goal: <130/80 mmHg (new HTN guidelines)  Family History: MI, decreased (father, 37)  Social History: Former Smoker of cigarettes 1 PPD for 20 years. Quit smoking on 05/18/2014. Drinks 2 glasses of wine per month. Denies illicit drug use)  Exercise: Says he is active at baseline, exercising at the gym 3-4 times per week. Uses the stair climber, elliptical, and weight machines. Denies any chest discomfort or dyspnea with exertion while performing these activities, granted he has not exercised since this past weekend as he wanted to be seen in the office prior to resuming his workout routine  Home BP readings: BP measured at 162/108 mmHg the day after stopping HCTZ but pt had a lot of CP at the time associated with sarcoidosis exacerbation. His home BP yesterday morning was 129/73 mmHg and this AM was 154/86 mmHg.   Wt Readings from Last 3 Encounters:    04/23/16 278 lb (126.1 kg)  04/13/16 276 lb 10.8 oz (125.5 kg)  04/07/16 282 lb (127.9 kg)   BP Readings from Last 3 Encounters:  04/23/16 108/76  04/14/16 (!) 146/94  04/07/16 130/84   Pulse Readings from Last 3 Encounters:  04/23/16 73  04/14/16 72  04/07/16 87    Renal function: Estimated Creatinine Clearance: 146.5 mL/min (by C-G formula based on SCr of 0.93 mg/dL).  Past Medical History:  Diagnosis Date  . Anginal pain ([place])   . Arthritis    ? 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 Followed by Dr. Trudie Reed  . Arthritis    "ankles" (04/13/2016)  . Asthma   . CORONARY ARTERY DISEASE    a. cath 05/2014: mild LAD disease, normal LCx and RCA. Risk factor modification recommended.  Marland Kitchen DIABETES MELLITUS, TYPE II   . Edema   . ERECTILE DYSFUNCTION, ORGANIC   . GERD   . Gout, unspecified   . Heart murmur   . HYPERLIPIDEMIA   . HYPERTENSION   . 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 Pericarditis    recurrent  . POLYNEUROPATHY   . PULMONARY SARCOIDOSIS    Mediastinal lymphadenopathy with biospy proven sarcodosis  . Seizures ([place])    "none in 4-5 years; don't know what kind;  not related to alcohol" (04/13/2016)  . TESTICULAR HYPOFUNCTION   . TIA (transient ischemic attack)    "I don't remember when" (04/13/2016)    Current Outpatient Prescriptions on File Prior to Visit  Medication Sig Dispense Refill  . albuterol (PROVENTIL HFA;VENTOLIN HFA) 108 (90 Base) MCG/ACT inhaler Inhale 2 puffs into the lungs every 6 (six) hours as needed for wheezing or shortness of breath. 1 Inhaler 11  . allopurinol (ZYLOPRIM) 300 MG tablet Take 1 tablet (300 mg total) by mouth daily. 90 tablet 3  . amLODipine (NORVASC) 5 MG tablet Take 1 tablet (5 mg total) by mouth daily. 90 tablet 0  . aspirin EC 81 MG tablet Take 1 tablet (81 mg total) by mouth daily. 30 tablet 3  . atorvastatin (LIPITOR) 40 MG tablet Take 1 tablet (40 mg total) by mouth daily at 6  PM. 30 tablet 12  . cholecalciferol (VITAMIN D) 1000 units tablet Take 1,000 Units by mouth daily.    . colchicine 0.6 MG tablet Take 0.6 mg by mouth daily as needed.    . furosemide (LASIX) 40 MG tablet Take 1 tablet (40 mg total) by mouth daily. 90 tablet 1  . glucose blood (FREESTYLE TEST STRIPS) test strip Use as instructed 100 each 12  . glucose blood (ONE TOUCH ULTRA TEST) test strip 1 each by Other route daily. Use as instructed 100 each 1  . glucose monitoring kit (FREESTYLE) monitoring kit 1 each by Does not apply route as needed for other. 1 each 0  . Lancets (FREESTYLE) lancets Use as instructed 100 each 12  . metFORMIN (GLUCOPHAGE-XR) 500 MG 24 hr tablet Take 1 tablet (500 mg total) by mouth 2 (two) times daily with a meal. 180 tablet 1  . metoprolol (LOPRESSOR) 50 MG tablet Take 1 tablet (50 mg total) by mouth 2 (two) times daily. 180 tablet 0  . nitroGLYCERIN (NITROSTAT) 0.4 MG SL tablet Place 1 tablet (0.4 mg total) under the tongue every 5 (five) minutes as needed for chest pain. 90 tablet 3  . potassium chloride SA (KLOR-CON M20) 20 MEQ tablet Take 1 tablet (20 mEq total) by mouth 2 (two) times daily. 180 tablet 1  . predniSONE (DELTASONE) 10 MG tablet 4 tabs for 5 days,  2 tabs for 1 week  then 1 tab for 1 week , then 1/2 tab for 1 week and stop 50 tablet 0  . saw palmetto 160 MG capsule Take 160 mg by mouth daily.    . tadalafil (CIALIS) 20 MG tablet Take 0.5-1 tablets (10-20 mg total) by mouth every 3 (three) days as needed for erectile dysfunction. 10 tablet 11  . testosterone cypionate (DEPOTESTOSTERONE CYPIONATE) 200 MG/ML injection Inject 1 mL (200 mg total) into the muscle every 14 (fourteen) days. 10 mL 0   Current Facility-Administered Medications on File Prior to Visit  Medication Dose Route Frequency Provider Last Rate Last Dose  . testosterone cypionate (DEPOTESTOSTERONE CYPIONATE) injection 200 mg  200 mg Intramuscular Q14 Days Tresa Garter, MD   200 mg at  04/23/16 1433  . testosterone cypionate (DEPOTESTOSTERONE CYPIONATE) injection 200 mg  200 mg Intramuscular Q14 Days [name], MD        Allergies  Allergen Reactions  . Sulfonamide Derivatives Rash and Other (See Comments)     Assessment/Plan:  1. Hypertension - Patient's blood pressure of 140/92 mmgHg is above the goal of <130/80 mmHg given new HTN guidelines. Patient is also on prednisone for 3 weeks  for a sarcoidosis exacerbation which can elevate blood pressure. Will increase amlodipine to 10 mg daily for blood pressure lowering and advised pt to monitor his ankles for swelling. Follow-up in clinic in 6 weeks to assess blood pressure after the completion of corticosteroid therapy.   Patient is aware to avoid NTG and Cialis use within 48 hours of each other.  Patient seen by Leroy Libman, P4 pharmacy student.  Megan E. Supple, PharmD, [place] [address], [place], [place] [address] Phone: [phone]; Fax: [phone] 04/27/2016 5:06 PM

## 2016-04-27 NOTE — Patient Instructions (Addendum)
Start taking Norvasc (amlodipine) 10 mg daily. If you have 5 mg tablets let, just take 2 tablets until gone.   Monitor your ankles for swelling.  Follow-up in clinic in 6 weeks for a blood pressure check.   Call the clinic at [phone] with any questions or concerns.

## 2016-04-28 ENCOUNTER — Ambulatory Visit (INDEPENDENT_AMBULATORY_CARE_PROVIDER_SITE_OTHER): Payer: Medicare Other | Admitting: Internal Medicine

## 2016-04-28 ENCOUNTER — Other Ambulatory Visit (INDEPENDENT_AMBULATORY_CARE_PROVIDER_SITE_OTHER): Payer: Medicare Other

## 2016-04-28 ENCOUNTER — Encounter: Payer: Self-pay | Admitting: Internal Medicine

## 2016-04-28 VITALS — BP 138/88 | HR 83 | Temp 98.2°F | Resp 16 | Wt 280.1 lb

## 2016-04-28 DIAGNOSIS — E669 Obesity, unspecified: Secondary | ICD-10-CM | POA: Diagnosis not present

## 2016-04-28 DIAGNOSIS — E1169 Type 2 diabetes mellitus with other specified complication: Secondary | ICD-10-CM | POA: Diagnosis not present

## 2016-04-28 DIAGNOSIS — M1000 Idiopathic gout, unspecified site: Secondary | ICD-10-CM

## 2016-04-28 DIAGNOSIS — E291 Testicular hypofunction: Secondary | ICD-10-CM

## 2016-04-28 LAB — MICROALBUMIN / CREATININE URINE RATIO
Creatinine,U: 127.6 mg/dL
Microalb Creat Ratio: 1.3 mg/g (ref 0.0–30.0)
Microalb, Ur: 1.7 mg/dL (ref 0.0–1.9)

## 2016-04-28 LAB — URIC ACID: Uric Acid, Serum: 4.9 mg/dL (ref 4.0–7.8)

## 2016-04-28 MED ORDER — GLUCOSE BLOOD VI STRP
ORAL_STRIP | 11 refills | Status: DC
Start: 2016-04-28 — End: 2016-05-07

## 2016-04-28 MED ORDER — ONETOUCH VERIO FLEX SYSTEM W/DEVICE KIT: 1.0000 | PACK | Freq: Three times a day (TID) | 0 refills | Status: DC | PRN

## 2016-04-28 MED ORDER — TESTOSTERONE CYPIONATE 200 MG/ML IM SOLN: 200.0000 mg | INTRAMUSCULAR | 0 refills | Status: DC

## 2016-04-28 NOTE — Progress Notes (Signed)
Pre visit review using our clinic review tool, if applicable. No additional management support is needed unless otherwise documented below in the visit note. 

## 2016-04-28 NOTE — Patient Instructions (Signed)
Hypertension Hypertension, commonly called high blood pressure, is when the force of blood pumping through your arteries is too strong. Your arteries are the blood vessels that carry blood from your heart throughout your body. A blood pressure reading consists of a higher number over a lower number, such as 110/72. The higher number (systolic) is the pressure inside your arteries when your heart pumps. The lower number (diastolic) is the pressure inside your arteries when your heart relaxes. Ideally you want your blood pressure below 120/80. Hypertension forces your heart to work harder to pump blood. Your arteries may become narrow or stiff. Having untreated or uncontrolled hypertension can cause heart attack, stroke, kidney disease, and other problems. What increases the risk? Some risk factors for high blood pressure are controllable. Others are not. Risk factors you cannot control include:  Race. You may be at higher risk if you are African American.  Age. Risk increases with age.  Gender. Men are at higher risk than women before age 45 years. After age 65, women are at higher risk than men. Risk factors you can control include:  Not getting enough exercise or physical activity.  Being overweight.  Getting too much fat, sugar, calories, or salt in your diet.  Drinking too much alcohol. What are the signs or symptoms? Hypertension does not usually cause signs or symptoms. Extremely high blood pressure (hypertensive crisis) may cause headache, anxiety, shortness of breath, and nosebleed. How is this diagnosed? To check if you have hypertension, your health care provider will measure your blood pressure while you are seated, with your arm held at the level of your heart. It should be measured at least twice using the same arm. Certain conditions can cause a difference in blood pressure between your right and left arms. A blood pressure reading that is higher than normal on one occasion does  not mean that you need treatment. If it is not clear whether you have high blood pressure, you may be asked to return on a different day to have your blood pressure checked again. Or, you may be asked to monitor your blood pressure at home for 1 or more weeks. How is this treated? Treating high blood pressure includes making lifestyle changes and possibly taking medicine. Living a healthy lifestyle can help lower high blood pressure. You may need to change some of your habits. Lifestyle changes may include:  Following the DASH diet. This diet is high in fruits, vegetables, and whole grains. It is low in salt, red meat, and added sugars.  Keep your sodium intake below 2,300 mg per day.  Getting at least 30-45 minutes of aerobic exercise at least 4 times per week.  Losing weight if necessary.  Not smoking.  Limiting alcoholic beverages.  Learning ways to reduce stress. Your health care provider may prescribe medicine if lifestyle changes are not enough to get your blood pressure under control, and if one of the following is true:  You are 18-59 years of age and your systolic blood pressure is above 140.  You are 60 years of age or older, and your systolic blood pressure is above 150.  Your diastolic blood pressure is above 90.  You have diabetes, and your systolic blood pressure is over 140 or your diastolic blood pressure is over 90.  You have kidney disease and your blood pressure is above 140/90.  You have heart disease and your blood pressure is above 140/90. Your personal target blood pressure may vary depending on your medical   conditions, your age, and other factors. Follow these instructions at home:  Have your blood pressure rechecked as directed by your health care provider.  Take medicines only as directed by your health care provider. Follow the directions carefully. Blood pressure medicines must be taken as prescribed. The medicine does not work as well when you skip  doses. Skipping doses also puts you at risk for problems.  Do not smoke.  Monitor your blood pressure at home as directed by your health care provider. Contact a health care provider if:  You think you are having a reaction to medicines taken.  You have recurrent headaches or feel dizzy.  You have swelling in your ankles.  You have trouble with your vision. Get help right away if:  You develop a severe headache or confusion.  You have unusual weakness, numbness, or feel faint.  You have severe chest or abdominal pain.  You vomit repeatedly.  You have trouble breathing. This information is not intended to replace advice given to you by your health care provider. Make sure you discuss any questions you have with your health care provider. Document Released: 05/25/2005 Document Revised: 10/31/2015 Document Reviewed: 03/17/2013 Elsevier Interactive Patient Education  2017 [employer]  

## 2016-04-28 NOTE — Progress Notes (Signed)
Subjective:  Patient ID: [name], male    DOB: 03/16/1971  Age: 45 y.o. MRN: [id number]  CC: Diabetes and Hypertension   HPI [name] presents for follow-up. He was recently admitted for atypical chest pain and ruled out for coronary artery disease. He tells me the chest pain has resolved. He has a mild, intermittent nonproductive cough that is attributed to sarcoid. He offers no other complaints today.  Outpatient Medications Prior to Visit  Medication Sig Dispense Refill  . albuterol (PROVENTIL HFA;VENTOLIN HFA) 108 (90 Base) MCG/ACT inhaler Inhale 2 puffs into the lungs every 6 (six) hours as needed for wheezing or shortness of breath. 1 Inhaler 11  . allopurinol (ZYLOPRIM) 300 MG tablet Take 1 tablet (300 mg total) by mouth daily. 90 tablet 3  . amLODipine (NORVASC) 10 MG tablet Take 1 tablet (10 mg total) by mouth daily. 90 tablet 0  . aspirin EC 81 MG tablet Take 1 tablet (81 mg total) by mouth daily. 30 tablet 3  . atorvastatin (LIPITOR) 40 MG tablet Take 1 tablet (40 mg total) by mouth daily at 6 PM. 30 tablet 12  . cholecalciferol (VITAMIN D) 1000 units tablet Take 1,000 Units by mouth daily.    . furosemide (LASIX) 40 MG tablet Take 1 tablet (40 mg total) by mouth daily. 90 tablet 1  . metFORMIN (GLUCOPHAGE-XR) 500 MG 24 hr tablet Take 1 tablet (500 mg total) by mouth 2 (two) times daily with a meal. 180 tablet 1  . metoprolol (LOPRESSOR) 50 MG tablet Take 1 tablet (50 mg total) by mouth 2 (two) times daily. 180 tablet 0  . nitroGLYCERIN (NITROSTAT) 0.4 MG SL tablet Place 1 tablet (0.4 mg total) under the tongue every 5 (five) minutes as needed for chest pain. 90 tablet 3  . potassium chloride SA (KLOR-CON M20) 20 MEQ tablet Take 1 tablet (20 mEq total) by mouth 2 (two) times daily. 180 tablet 1  . predniSONE (DELTASONE) 10 MG tablet 4 tabs for 5 days,  2 tabs for 1 week  then 1 tab for 1 week , then 1/2 tab for 1 week and stop 50 tablet 0  . tadalafil (CIALIS) 20  MG tablet Take 0.5-1 tablets (10-20 mg total) by mouth every 3 (three) days as needed for erectile dysfunction. 10 tablet 11  . glucose blood (FREESTYLE TEST STRIPS) test strip Use as instructed 100 each 12  . glucose blood (ONE TOUCH ULTRA TEST) test strip 1 each by Other route daily. Use as instructed 100 each 1  . glucose monitoring kit (FREESTYLE) monitoring kit 1 each by Does not apply route as needed for other. 1 each 0  . Lancets (FREESTYLE) lancets Use as instructed 100 each 12  . saw palmetto 160 MG capsule Take 160 mg by mouth daily.    Marland Kitchen testosterone cypionate (DEPOTESTOSTERONE CYPIONATE) 200 MG/ML injection Inject 1 mL (200 mg total) into the muscle every 14 (fourteen) days. 10 mL 0  . colchicine 0.6 MG tablet Take 0.6 mg by mouth daily as needed.    . testosterone cypionate (DEPOTESTOSTERONE CYPIONATE) injection 200 mg     . testosterone cypionate (DEPOTESTOSTERONE CYPIONATE) injection 200 mg      No facility-administered medications prior to visit.     ROS Review of Systems  Constitutional: Negative.  Negative for activity change, appetite change, chills, diaphoresis and fatigue.  HENT: Negative.  Negative for trouble swallowing.   Eyes: Negative for visual disturbance.  Respiratory: Positive for cough.  Negative for choking, chest tightness, shortness of breath, wheezing and stridor.   Cardiovascular: Negative for chest pain, palpitations and leg swelling.  Gastrointestinal: Negative.  Negative for abdominal pain, constipation, diarrhea, nausea and vomiting.  Endocrine: Negative.  Negative for polydipsia, polyphagia and polyuria.  Genitourinary: Negative.   Musculoskeletal: Negative for back pain, joint swelling and myalgias.  Skin: Negative.  Negative for color change and rash.  Neurological: Negative.  Negative for dizziness, weakness and light-headedness.  Hematological: Negative.  Negative for adenopathy. Does not bruise/bleed easily.  Psychiatric/Behavioral: Negative.       Objective:  BP 138/88 (BP Location: Right Arm, Patient Position: Sitting, Cuff Size: Normal)   Pulse 83   Temp 98.2 F (36.8 C) (Oral)   Resp 16   Wt 280 lb 1.3 oz (127 kg)   SpO2 100%   BMI 35.01 kg/m   BP Readings from Last 3 Encounters:  04/28/16 138/88  04/27/16 (!) 140/92  04/23/16 108/76    Wt Readings from Last 3 Encounters:  04/28/16 280 lb 1.3 oz (127 kg)  04/23/16 278 lb (126.1 kg)  04/13/16 276 lb 10.8 oz (125.5 kg)    Physical Exam  Constitutional: He is oriented to person, place, and time. No distress.  HENT:  Mouth/Throat: Oropharynx is clear and moist. No oropharyngeal exudate.  Eyes: Conjunctivae are normal. Right eye exhibits no discharge. Left eye exhibits no discharge. No scleral icterus.  Neck: Normal range of motion. Neck supple. No JVD present. No tracheal deviation present. No thyromegaly present.  Cardiovascular: Normal rate, regular rhythm, normal heart sounds and intact distal pulses.  Exam reveals no gallop and no friction rub.   No murmur heard. Pulmonary/Chest: Effort normal and breath sounds normal. No stridor. No respiratory distress. He has no wheezes. He has no rales. He exhibits no tenderness.  Abdominal: Soft. Bowel sounds are normal. He exhibits no distension and no mass. There is no tenderness. There is no rebound and no guarding.  Musculoskeletal: Normal range of motion. He exhibits no edema, tenderness or deformity.  Lymphadenopathy:    He has no cervical adenopathy.  Neurological: He is oriented to person, place, and time.  Skin: Skin is warm and dry. No rash noted. He is not diaphoretic. No erythema. No pallor.    Lab Results  Component Value Date   WBC 7.6 04/14/2016   HGB 15.4 04/14/2016   HCT 43.5 04/14/2016   PLT 238 04/14/2016   GLUCOSE 168 (H) 04/14/2016   CHOL 162 04/14/2016   TRIG 147 04/14/2016   HDL 33 (L) 04/14/2016   LDLCALC 100 (H) 04/14/2016   ALT 24 11/06/2015   AST 18 11/06/2015   NA 138 04/14/2016    K 3.5 04/14/2016   CL 101 04/14/2016   CREATININE 0.93 04/14/2016   BUN 8 04/14/2016   CO2 29 04/14/2016   TSH 1.102 04/14/2016   INR 0.98 04/14/2016   HGBA1C 7.1 (H) 04/14/2016   MICROALBUR 1.4 04/29/2015    Dg Chest 2 View  Result Date: 04/13/2016 CLINICAL DATA:  Chest pain for 1 week with progression EXAM: CHEST  2 VIEW COMPARISON:  November 22, 2013 FINDINGS: There is no edema or consolidation. The heart size and pulmonary vascularity are normal. No adenopathy. There is degenerative change in the thoracic spine. IMPRESSION: No edema or consolidation. Electronically Signed   By: Lowella Grip III M.D.   On: 04/13/2016 08:52    Assessment & Plan:   Daeshaun was seen today for diabetes and hypertension.  Diagnoses and all orders for this visit:  Male hypogonadism - His last testosterone dose was 5 days prior to this visit. I will recheck his testosterone level and adjust his dose if indicated. -     testosterone cypionate (DEPOTESTOSTERONE CYPIONATE) 200 MG/ML injection; Inject 1 mL (200 mg total) into the muscle every 14 (fourteen) days. -     Testosterone Total,Free,Bio, Males; Future  Hypogonadism in male  Diabetes mellitus type 2 in obese [facility])- his recent A1c was 7.1%, his blood sugars are adequately well controlled. -     Blood Glucose Monitoring Suppl (ONETOUCH VERIO FLEX SYSTEM) w/Device KIT; 1 Act by Does not apply route 3 (three) times daily with meals as needed. -     glucose blood (ONETOUCH VERIO) test strip; Use TID -     Microalbumin / creatinine urine ratio; Future  Idiopathic gout, unspecified chronicity, unspecified site- his uric acid level is below the desired goal of 6 at the current dose of allopurinol. Will continue. -     Uric acid; Future   I have discontinued Mr. Kardell saw palmetto, glucose blood, glucose monitoring kit, freestyle, and glucose blood. I am also having him start on [place] and glucose blood. Additionally, I am having  him maintain his nitroGLYCERIN, aspirin EC, metoprolol, allopurinol, albuterol, tadalafil, metFORMIN, potassium chloride SA, furosemide, cholecalciferol, atorvastatin, colchicine, predniSONE, amLODipine, folic acid, methotrexate, and testosterone cypionate. We will stop administering testosterone cypionate and testosterone cypionate.  Meds ordered this encounter  Medications  . folic acid (FOLVITE) 1 MG tablet    Sig: TK 1 T PO QD    Refill:  1  . methotrexate (RHEUMATREX) 2.5 MG tablet    Sig: TK 4 TS PO 1 TIME A WK    Refill:  1  . testosterone cypionate (DEPOTESTOSTERONE CYPIONATE) 200 MG/ML injection    Sig: Inject 1 mL (200 mg total) into the muscle every 14 (fourteen) days.    Dispense:  10 mL    Refill:  0  . Blood Glucose Monitoring Suppl (ONETOUCH VERIO FLEX SYSTEM) w/Device KIT    Sig: 1 Act by Does not apply route 3 (three) times daily with meals as needed.    Dispense:  2 kit    Refill:  0  . glucose blood (ONETOUCH VERIO) test strip    Sig: Use TID    Dispense:  100 each    Refill:  11     Follow-up: No Follow-up on file.  Scarlette Calico, MD

## 2016-04-29 ENCOUNTER — Encounter: Payer: Self-pay | Admitting: Internal Medicine

## 2016-04-29 LAB — TESTOSTERONE TOTAL,FREE,BIO, MALES
Albumin: 4.3 g/dL (ref 3.6–5.1)
Sex Hormone Binding: 29 nmol/L (ref 10–50)
Testosterone, Bioavailable: 170.3 ng/dL (ref 110.0–575.0)
Testosterone, Free: 86.4 pg/mL (ref 46.0–224.0)
Testosterone: 556 ng/dL (ref 250–827)

## 2016-05-07 ENCOUNTER — Ambulatory Visit (INDEPENDENT_AMBULATORY_CARE_PROVIDER_SITE_OTHER)
Admission: RE | Admit: 2016-05-07 | Discharge: 2016-05-07 | Disposition: A | Discharge status: Home / Self Care | Payer: Medicare Other | Source: Ambulatory Visit | Attending: Nurse Practitioner | Admitting: Nurse Practitioner

## 2016-05-07 ENCOUNTER — Encounter: Payer: Self-pay | Admitting: Nurse Practitioner

## 2016-05-07 ENCOUNTER — Ambulatory Visit (INDEPENDENT_AMBULATORY_CARE_PROVIDER_SITE_OTHER): Payer: Medicare Other | Admitting: Nurse Practitioner

## 2016-05-07 ENCOUNTER — Other Ambulatory Visit (INDEPENDENT_AMBULATORY_CARE_PROVIDER_SITE_OTHER): Payer: Medicare Other

## 2016-05-07 ENCOUNTER — Telehealth: Payer: Self-pay | Admitting: Internal Medicine

## 2016-05-07 VITALS — BP 130/84 | HR 72 | Temp 97.4°F | Ht 75.0 in | Wt 282.0 lb

## 2016-05-07 DIAGNOSIS — J9801 Acute bronchospasm: Secondary | ICD-10-CM

## 2016-05-07 DIAGNOSIS — J0140 Acute pansinusitis, unspecified: Secondary | ICD-10-CM

## 2016-05-07 DIAGNOSIS — D899 Disorder involving the immune mechanism, unspecified: Secondary | ICD-10-CM

## 2016-05-07 DIAGNOSIS — E291 Testicular hypofunction: Secondary | ICD-10-CM

## 2016-05-07 DIAGNOSIS — D849 Immunodeficiency, unspecified: Secondary | ICD-10-CM | POA: Diagnosis not present

## 2016-05-07 DIAGNOSIS — R0602 Shortness of breath: Secondary | ICD-10-CM | POA: Diagnosis not present

## 2016-05-07 DIAGNOSIS — E1169 Type 2 diabetes mellitus with other specified complication: Secondary | ICD-10-CM

## 2016-05-07 DIAGNOSIS — R05 Cough: Secondary | ICD-10-CM | POA: Diagnosis not present

## 2016-05-07 DIAGNOSIS — E669 Obesity, unspecified: Secondary | ICD-10-CM

## 2016-05-07 LAB — PSA: PSA: 1.33 ng/mL (ref 0.10–4.00)

## 2016-05-07 MED ORDER — DM-GUAIFENESIN ER 30-600 MG PO TB12: 1.0000 | ORAL_TABLET | Freq: Two times a day (BID) | ORAL | 0 refills | Status: DC | PRN

## 2016-05-07 MED ORDER — GLUCOSE BLOOD VI STRP: ORAL_STRIP | 11 refills | Status: DC

## 2016-05-07 MED ORDER — LEVOFLOXACIN 500 MG PO TABS: 500.0000 mg | ORAL_TABLET | Freq: Every day | ORAL | 0 refills | Status: DC

## 2016-05-07 MED ORDER — TESTOSTERONE CYPIONATE 200 MG/ML IM SOLN
200.0000 mg | INTRAMUSCULAR | Status: DC
  Administered 2016-05-07: 200 mg via INTRAMUSCULAR

## 2016-05-07 MED ORDER — HYDROCODONE-HOMATROPINE 5-1.5 MG/5ML PO SYRP: 5.0000 mL | ORAL_SOLUTION | Freq: Every evening | ORAL | 0 refills | Status: DC | PRN

## 2016-05-07 NOTE — Patient Instructions (Signed)
Go to basement for CXR. You will be called with CXR results.  Encourage adequate oral hydration. Return to office if no improvement in 1week.  URI Instructions: Flonase and Afrin use: apply 1spray of afrin in each nare, wait , then apply 2sprays of flonase in each nare. Use both nasal spray consecutively x 3days, then flonase only for at least 14days.  Use over-the-counter  "cold" medicines  such as "Tylenol cold" , "Advil cold",  "Mucinex" or" Mucinex D"  for cough and congestion.  Avoid decongestants if you have high blood pressure. Use" Delsym" or" Robitussin" cough syrup varietis for cough.  You can use plain "Tylenol" or "Advi"l for fever, chills and achyness.   "Common cold" symptoms are usually triggered by a virus.  The antibiotics are usually not necessary. On average, a" viral cold" illness would take 4-7 days to resolve. Please, make an appointment if you are not better or if you're worse.

## 2016-05-07 NOTE — Telephone Encounter (Signed)
Called in rx for testosterone to [place] at [facility]---ok to send rx and get patient to go downstairs for psa lab per dr jones----call patient after psa labs result to advise if dr Yetta Barre wants him to continue with injections

## 2016-05-07 NOTE — Progress Notes (Signed)
Subjective:  Patient ID: [name], male    DOB: 23-Mar-1971  Age: 45 y.o. MRN: [id number]  CC: Cough (coughing yellow mucus,pain radiate toward back when cough,going on for 1 wk and half. took mucinex & nyquil. )   Cough  This is a new problem. The current episode started 1 to 4 weeks ago. The problem has been gradually worsening. The problem occurs constantly. The cough is productive of purulent sputum. Associated symptoms include chest pain, chills, headaches, myalgias, nasal congestion, postnasal drip, rhinorrhea, a sore throat, shortness of breath and wheezing. Pertinent negatives include no hemoptysis. The symptoms are aggravated by lying down and cold air. He has tried OTC cough suppressant and a beta-agonist inhaler for the symptoms. The treatment provided mild relief. His past medical history is significant for asthma and environmental allergies.  last dose of methotrexate 05/07/16.  Outpatient Medications Prior to Visit  Medication Sig Dispense Refill  . albuterol (PROVENTIL HFA;VENTOLIN HFA) 108 (90 Base) MCG/ACT inhaler Inhale 2 puffs into the lungs every 6 (six) hours as needed for wheezing or shortness of breath. 1 Inhaler 11  . allopurinol (ZYLOPRIM) 300 MG tablet Take 1 tablet (300 mg total) by mouth daily. 90 tablet 3  . amLODipine (NORVASC) 10 MG tablet Take 1 tablet (10 mg total) by mouth daily. 90 tablet 0  . aspirin EC 81 MG tablet Take 1 tablet (81 mg total) by mouth daily. 30 tablet 3  . atorvastatin (LIPITOR) 40 MG tablet Take 1 tablet (40 mg total) by mouth daily at 6 PM. 30 tablet 12  . Blood Glucose Monitoring Suppl (ONETOUCH VERIO FLEX SYSTEM) w/Device KIT 1 Act by Does not apply route 3 (three) times daily with meals as needed. 2 kit 0  . cholecalciferol (VITAMIN D) 1000 units tablet Take 1,000 Units by mouth daily.    . colchicine 0.6 MG tablet Take 0.6 mg by mouth daily as needed.    . folic acid (FOLVITE) 1 MG tablet TK 1 T PO QD  1  . furosemide (LASIX)  40 MG tablet Take 1 tablet (40 mg total) by mouth daily. 90 tablet 1  . metFORMIN (GLUCOPHAGE-XR) 500 MG 24 hr tablet Take 1 tablet (500 mg total) by mouth 2 (two) times daily with a meal. 180 tablet 1  . methotrexate (RHEUMATREX) 2.5 MG tablet TK 4 TS PO 1 TIME A WK  1  . metoprolol (LOPRESSOR) 50 MG tablet Take 1 tablet (50 mg total) by mouth 2 (two) times daily. 180 tablet 0  . nitroGLYCERIN (NITROSTAT) 0.4 MG SL tablet Place 1 tablet (0.4 mg total) under the tongue every 5 (five) minutes as needed for chest pain. 90 tablet 3  . potassium chloride SA (KLOR-CON M20) 20 MEQ tablet Take 1 tablet (20 mEq total) by mouth 2 (two) times daily. 180 tablet 1  . predniSONE (DELTASONE) 10 MG tablet 4 tabs for 5 days,  2 tabs for 1 week  then 1 tab for 1 week , then 1/2 tab for 1 week and stop 50 tablet 0  . tadalafil (CIALIS) 20 MG tablet Take 0.5-1 tablets (10-20 mg total) by mouth every 3 (three) days as needed for erectile dysfunction. 10 tablet 11  . testosterone cypionate (DEPOTESTOSTERONE CYPIONATE) 200 MG/ML injection Inject 1 mL (200 mg total) into the muscle every 14 (fourteen) days. 10 mL 0  . glucose blood (ONETOUCH VERIO) test strip Use TID 100 each 11   No facility-administered medications prior to visit.  ROS See HPI  Objective:  BP 130/84   Pulse 72   Temp 97.4 F (36.3 C)   Ht 6\' 3"  (1.905 m)   Wt 282 lb (127.9 kg)   SpO2 98%   BMI 35.25 kg/m   BP Readings from Last 3 Encounters:  05/07/16 130/84  04/28/16 138/88  04/27/16 (!) 140/92    Wt Readings from Last 3 Encounters:  05/07/16 282 lb (127.9 kg)  04/28/16 280 lb 1.3 oz (127 kg)  04/23/16 278 lb (126.1 kg)    Physical Exam  Constitutional: He is oriented to person, place, and time.  HENT:  Right Ear: Tympanic membrane, external ear and ear canal normal.  Left Ear: Tympanic membrane, external ear and ear canal normal.  Nose: Mucosal edema and rhinorrhea present. Right sinus exhibits maxillary sinus tenderness  and frontal sinus tenderness. Left sinus exhibits maxillary sinus tenderness and frontal sinus tenderness.  Mouth/Throat: Uvula is midline. No trismus in the jaw. Posterior oropharyngeal erythema present. No oropharyngeal exudate.  Neck: Normal range of motion. Neck supple.  Cardiovascular: Normal rate and normal heart sounds.   Pulmonary/Chest: Effort normal. No respiratory distress. He has wheezes. He has rales. He exhibits no tenderness.  Musculoskeletal: Normal range of motion. He exhibits no edema.  Lymphadenopathy:    He has cervical adenopathy.  Neurological: He is alert and oriented to person, place, and time.    Lab Results  Component Value Date   WBC 7.6 04/14/2016   HGB 15.4 04/14/2016   HCT 43.5 04/14/2016   PLT 238 04/14/2016   GLUCOSE 168 (H) 04/14/2016   CHOL 162 04/14/2016   TRIG 147 04/14/2016   HDL 33 (L) 04/14/2016   LDLCALC 100 (H) 04/14/2016   ALT 24 11/06/2015   AST 18 11/06/2015   NA 138 04/14/2016   K 3.5 04/14/2016   CL 101 04/14/2016   CREATININE 0.93 04/14/2016   BUN 8 04/14/2016   CO2 29 04/14/2016   TSH 1.102 04/14/2016   INR 0.98 04/14/2016   HGBA1C 7.1 (H) 04/14/2016   MICROALBUR 1.7 04/28/2016    Dg Chest 2 View  Result Date: 05/07/2016 CLINICAL DATA:  Pt c/o cough and sob x 7 days. Left side cp x 2 days. Hx of sarcoid, DM, HTN. Ex smoker. EXAM: CHEST  2 VIEW COMPARISON:  04/13/2016 FINDINGS: All cardiac silhouette is normal in size and configuration. Normal mediastinal and hilar contours. Clear lungs.  No pleural effusion or pneumothorax. Mild degenerative spurring along the mid to lower thoracic spine. Skeletal structures are otherwise unremarkable. IMPRESSION: No active cardiopulmonary disease. Electronically Signed   By: 13/11/2015 M.D.   On: 05/07/2016 11:43    Assessment & Plan:   Codie was seen today for cough.  Diagnoses and all orders for this visit:  Cough due to bronchospasm -     dextromethorphan-guaiFENesin (MUCINEX DM)  30-600 MG 12hr tablet; Take 1 tablet by mouth 2 (two) times daily as needed for cough. -     HYDROcodone-homatropine (HYCODAN) 5-1.5 MG/5ML syrup; Take 5 mLs by mouth at bedtime as needed for cough. -     DG Chest 2 View; Future -     levofloxacin (LEVAQUIN) 500 MG tablet; Take 1 tablet (500 mg total) by mouth daily.  Shortness of breath -     dextromethorphan-guaiFENesin (MUCINEX DM) 30-600 MG 12hr tablet; Take 1 tablet by mouth 2 (two) times daily as needed for cough. -     HYDROcodone-homatropine (HYCODAN) 5-1.5 MG/5ML syrup; Take 5  mLs by mouth at bedtime as needed for cough. -     DG Chest 2 View; Future -     levofloxacin (LEVAQUIN) 500 MG tablet; Take 1 tablet (500 mg total) by mouth daily.  Acute non-recurrent pansinusitis -     levofloxacin (LEVAQUIN) 500 MG tablet; Take 1 tablet (500 mg total) by mouth daily.  Immunocompromised patient [facility]) -     DG Chest 2 View; Future -     levofloxacin (LEVAQUIN) 500 MG tablet; Take 1 tablet (500 mg total) by mouth daily.  Male hypogonadism -     PSA; Future -     testosterone cypionate (DEPOTESTOSTERONE CYPIONATE) injection 200 mg; Inject 1 mL (200 mg total) into the muscle every 14 (fourteen) days.  Diabetes mellitus type 2 in obese (HCC) -     glucose blood (ONETOUCH VERIO) test strip; Use TID   I am having Mr. Zawadzki start on dextromethorphan-guaiFENesin, HYDROcodone-homatropine, and levofloxacin. I am also having him maintain his nitroGLYCERIN, aspirin EC, metoprolol, allopurinol, albuterol, tadalafil, metFORMIN, potassium chloride SA, furosemide, cholecalciferol, atorvastatin, colchicine, predniSONE, amLODipine, folic acid, methotrexate, testosterone cypionate, ONETOUCH VERIO FLEX SYSTEM, and glucose blood. We administered testosterone cypionate. We will continue to administer testosterone cypionate.  Meds ordered this encounter  Medications  . dextromethorphan-guaiFENesin (MUCINEX DM) 30-600 MG 12hr tablet    Sig: Take 1 tablet  by mouth 2 (two) times daily as needed for cough.    Dispense:  14 tablet    Refill:  0    Order Specific Question:   Supervising Provider    Answer:   Cassandria Anger [[id number]]  . HYDROcodone-homatropine (HYCODAN) 5-1.5 MG/5ML syrup    Sig: Take 5 mLs by mouth at bedtime as needed for cough.    Dispense:  100 mL    Refill:  0    Order Specific Question:   Supervising Provider    Answer:   Cassandria Anger [[id number]]  . glucose blood (ONETOUCH VERIO) test strip    Sig: Use TID    Dispense:  100 each    Refill:  11  . testosterone cypionate (DEPOTESTOSTERONE CYPIONATE) injection 200 mg  . levofloxacin (LEVAQUIN) 500 MG tablet    Sig: Take 1 tablet (500 mg total) by mouth daily.    Dispense:  7 tablet    Refill:  0    Order Specific Question:   Supervising Provider    Answer:   Cassandria Anger [[id number]]    Follow-up: Return if symptoms worsen or fail to improve.  Wilfred Lacy, NP

## 2016-05-07 NOTE — Progress Notes (Signed)
Pre visit review using our clinic review tool, if applicable. No additional management support is needed unless otherwise documented below in the visit note. 

## 2016-05-07 NOTE — Progress Notes (Signed)
Normal results, see office note

## 2016-05-08 DIAGNOSIS — M791 Myalgia: Secondary | ICD-10-CM | POA: Diagnosis not present

## 2016-05-13 DIAGNOSIS — D860 Sarcoidosis of lung: Secondary | ICD-10-CM | POA: Diagnosis not present

## 2016-05-13 DIAGNOSIS — I158 Other secondary hypertension: Secondary | ICD-10-CM | POA: Diagnosis not present

## 2016-05-13 DIAGNOSIS — E119 Type 2 diabetes mellitus without complications: Secondary | ICD-10-CM | POA: Diagnosis not present

## 2016-05-13 DIAGNOSIS — E784 Other hyperlipidemia: Secondary | ICD-10-CM | POA: Diagnosis not present

## 2016-05-13 DIAGNOSIS — Z79899 Other long term (current) drug therapy: Secondary | ICD-10-CM | POA: Diagnosis not present

## 2016-05-13 DIAGNOSIS — M109 Gout, unspecified: Secondary | ICD-10-CM | POA: Diagnosis not present

## 2016-05-15 ENCOUNTER — Other Ambulatory Visit: Payer: Self-pay | Admitting: Internal Medicine

## 2016-05-19 DIAGNOSIS — B079 Viral wart, unspecified: Secondary | ICD-10-CM | POA: Diagnosis not present

## 2016-05-19 DIAGNOSIS — D485 Neoplasm of uncertain behavior of skin: Secondary | ICD-10-CM | POA: Diagnosis not present

## 2016-05-21 ENCOUNTER — Ambulatory Visit (INDEPENDENT_AMBULATORY_CARE_PROVIDER_SITE_OTHER): Payer: Medicare Other | Admitting: General Practice

## 2016-05-21 DIAGNOSIS — E291 Testicular hypofunction: Secondary | ICD-10-CM | POA: Diagnosis not present

## 2016-05-21 MED ORDER — TESTOSTERONE CYPIONATE 200 MG/ML IM SOLN
200.0000 mg | INTRAMUSCULAR | Status: DC
  Administered 2016-05-21: 200 mg via INTRAMUSCULAR

## 2016-05-24 ENCOUNTER — Encounter (HOSPITAL_COMMUNITY): Payer: Self-pay | Admitting: *Deleted

## 2016-05-24 ENCOUNTER — Emergency Department (HOSPITAL_COMMUNITY)
Admission: EM | Admit: 2016-05-24 | Discharge: 2016-05-25 | Disposition: A | Discharge status: Home / Self Care | Payer: Medicare Other | Attending: Emergency Medicine | Admitting: Emergency Medicine

## 2016-05-24 ENCOUNTER — Emergency Department (HOSPITAL_COMMUNITY): Payer: Medicare Other

## 2016-05-24 DIAGNOSIS — I10 Essential (primary) hypertension: Secondary | ICD-10-CM | POA: Insufficient documentation

## 2016-05-24 DIAGNOSIS — M7989 Other specified soft tissue disorders: Secondary | ICD-10-CM | POA: Diagnosis not present

## 2016-05-24 DIAGNOSIS — Z87891 Personal history of nicotine dependence: Secondary | ICD-10-CM | POA: Diagnosis not present

## 2016-05-24 DIAGNOSIS — M25562 Pain in left knee: Secondary | ICD-10-CM | POA: Diagnosis not present

## 2016-05-24 DIAGNOSIS — Z7982 Long term (current) use of aspirin: Secondary | ICD-10-CM | POA: Diagnosis not present

## 2016-05-24 DIAGNOSIS — Z8673 Personal history of transient ischemic attack (TIA), and cerebral infarction without residual deficits: Secondary | ICD-10-CM | POA: Diagnosis not present

## 2016-05-24 DIAGNOSIS — J45909 Unspecified asthma, uncomplicated: Secondary | ICD-10-CM | POA: Diagnosis not present

## 2016-05-24 DIAGNOSIS — E119 Type 2 diabetes mellitus without complications: Secondary | ICD-10-CM | POA: Insufficient documentation

## 2016-05-24 DIAGNOSIS — S8992XA Unspecified injury of left lower leg, initial encounter: Secondary | ICD-10-CM | POA: Diagnosis not present

## 2016-05-24 DIAGNOSIS — I2510 Atherosclerotic heart disease of native coronary artery without angina pectoris: Secondary | ICD-10-CM | POA: Insufficient documentation

## 2016-05-24 DIAGNOSIS — Z7984 Long term (current) use of oral hypoglycemic drugs: Secondary | ICD-10-CM | POA: Diagnosis not present

## 2016-05-24 DIAGNOSIS — Z79899 Other long term (current) drug therapy: Secondary | ICD-10-CM | POA: Diagnosis not present

## 2016-05-24 DIAGNOSIS — M79662 Pain in left lower leg: Secondary | ICD-10-CM | POA: Diagnosis not present

## 2016-05-24 LAB — CBC
HCT: 41.8 % (ref 39.0–52.0)
Hemoglobin: 15 g/dL (ref 13.0–17.0)
MCH: 27.6 pg (ref 26.0–34.0)
MCHC: 35.9 g/dL (ref 30.0–36.0)
MCV: 77 fL — ABNORMAL LOW (ref 78.0–100.0)
Platelets: 253 10*3/uL (ref 150–400)
RBC: 5.43 MIL/uL (ref 4.22–5.81)
RDW: 14.3 % (ref 11.5–15.5)
WBC: 6.6 10*3/uL (ref 4.0–10.5)

## 2016-05-24 LAB — I-STAT CHEM 8, ED
BUN: 11 mg/dL (ref 6–20)
Calcium, Ion: 1.21 mmol/L (ref 1.15–1.40)
Chloride: 99 mmol/L — ABNORMAL LOW (ref 101–111)
Creatinine, Ser: 0.7 mg/dL (ref 0.61–1.24)
Glucose, Bld: 202 mg/dL — ABNORMAL HIGH (ref 65–99)
HCT: 43 % (ref 39.0–52.0)
Hemoglobin: 14.6 g/dL (ref 13.0–17.0)
Potassium: 3.3 mmol/L — ABNORMAL LOW (ref 3.5–5.1)
Sodium: 141 mmol/L (ref 135–145)
TCO2: 27 mmol/L (ref 0–100)

## 2016-05-24 LAB — PROTIME-INR
INR: 0.89
Prothrombin Time: 12 seconds (ref 11.4–15.2)

## 2016-05-24 LAB — D-DIMER, QUANTITATIVE: D-Dimer, Quant: 0.36 ug/mL-FEU (ref 0.00–0.50)
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
## 2016-05-24 NOTE — ED Notes (Signed)
Patient transported to X-ray 
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
## 2016-05-25 MED ORDER — TRAMADOL HCL 50 MG PO TABS: 50.0000 mg | ORAL_TABLET | Freq: Four times a day (QID) | ORAL | 0 refills | Status: DC | PRN

## 2016-05-25 MED ORDER — IBUPROFEN 600 MG PO TABS: 600.0000 mg | ORAL_TABLET | Freq: Four times a day (QID) | ORAL | 0 refills | Status: DC | PRN

## 2016-05-25 NOTE — Discharge Instructions (Signed)
Discussion her x-rays are normal without any sign of fracture or perceived edema or swelling on your x-ray.  The one blood test that was performed specifically to evaluate for DVT or blood clot is within normal parameters. These make an appointment with your primary care physician if you develop increased swelling, increased pain. You have been given a prescription for ibuprofen for minor pain relief as well as trim for more severe pain

## 2016-05-26 ENCOUNTER — Ambulatory Visit: Payer: Self-pay | Admitting: Internal Medicine

## 2016-05-27 ENCOUNTER — Ambulatory Visit: Payer: Medicare Other | Admitting: Internal Medicine

## 2016-06-04 ENCOUNTER — Ambulatory Visit (INDEPENDENT_AMBULATORY_CARE_PROVIDER_SITE_OTHER): Payer: Medicare Other | Admitting: General Practice

## 2016-06-04 DIAGNOSIS — E291 Testicular hypofunction: Secondary | ICD-10-CM | POA: Diagnosis not present

## 2016-06-04 MED ORDER — TESTOSTERONE CYPIONATE 200 MG/ML IM SOLN
200.0000 mg | INTRAMUSCULAR | Status: DC
  Administered 2016-06-04: 200 mg via INTRAMUSCULAR

## 2016-06-09 ENCOUNTER — Ambulatory Visit (INDEPENDENT_AMBULATORY_CARE_PROVIDER_SITE_OTHER): Payer: Medicare Other | Admitting: Pharmacist

## 2016-06-09 VITALS — BP 144/90 | HR 78 | Ht 75.0 in | Wt 284.0 lb

## 2016-06-09 DIAGNOSIS — I10 Essential (primary) hypertension: Secondary | ICD-10-CM | POA: Diagnosis not present

## 2016-06-09 MED ORDER — TORSEMIDE 20 MG PO TABS: 20.0000 mg | ORAL_TABLET | Freq: Every day | ORAL | 11 refills | Status: DC

## 2016-06-09 MED ORDER — VALSARTAN 80 MG PO TABS: 80.0000 mg | ORAL_TABLET | Freq: Every day | ORAL | 11 refills | Status: DC

## 2016-06-09 NOTE — Patient Instructions (Addendum)
Stop taking fursemide. Start taking torsemide 20mg  daily. See if this helps with your weight gain - we can increase the dose if needed.  Start taking valsartan 80mg  once a day for your blood pressure.  Follow up lab in 1 week - Wednesday, January 10th any time after 7:30am.  Follow up in blood pressure clinic in 3 weeks - Tuesday, January 23rd at 4pm.

## 2016-06-09 NOTE — Progress Notes (Signed)
Patient ID: [name]                 DOB: Mar 17, 1971                      MRN: [id number]     HPI: [name] is a 46 y.o. male patient of Dr. Meda Coffee referred by to HTN clinic. PMH is significant for CAD (nonobstructive disease by cath in 05/2014), HTN, HLD, DM2, RA, edema and chronic pulmonary sarcoidosis. At last OV, pt was on a prednisone taper for sarcoidosis exacerbation which was likely contributing to elevated BP readings. His amlodipine dose was increased and pt presents today for follow up.]  Pt reports that he reduced his amlodipine back to '5mg'$  because the '10mg'$  was causing a lot of ankle swelling. This resolved when he decreased his dose. He has been off his prednisone taper for the past 3 weeks. Reports that he has gained some weight around his abdomen and thinks he is retaining fluid. Baseline weight 278, had been up to 287, weight today 284. Home readings ranging 130/80 - 160/90. Had a soda about 4 hours ago at lunch, will drink 1 cup of coffee a day. Pt's testosterone may be contributing to BP elevations. He is rarely using ibuprofen.  Current HTN meds: amlodipine 5 mg daily, lasix 40 mg daily, metoprolol 50 mg BID Previously tried: HCTZ 25 mg daily (d/c'd b/c taking Lasix), lisinopril - cough BP goal: <130/80 mmHg  Family History: MI, decreased (father, 81)  Social History: Former Smoker of cigarettes 1 PPD for 20 years. Quit smoking on 05/18/2014. Drinks 2 glasses of wine per month. Denies illicit drug use)  Exercise: Says he is active at baseline, exercising at the gym 3-4 times per week. Uses the stair climber, elliptical, and weight machines. Denies any chest discomfort or dyspnea with exertion while performing these activities, granted he has not exercised since this past weekend as he wanted to be seen in the office prior to resuming his workout routine  Home BP readings: BP measured at 162/108 mmHg the day after stopping HCTZ but pt had a lot of CP at the  time associated with sarcoidosis exacerbation. His home BP yesterday morning was 129/73 mmHg and this AM was 154/86 mmHg.   Wt Readings from Last 3 Encounters:  05/24/16 287 lb 7 oz (130.4 kg)  05/07/16 282 lb (127.9 kg)  04/28/16 280 lb 1.3 oz (127 kg)   BP Readings from Last 3 Encounters:  05/25/16 123/80  05/07/16 130/84  04/28/16 138/88   Pulse Readings from Last 3 Encounters:  05/25/16 98  05/07/16 72  04/28/16 83    Renal function: CrCl cannot be calculated (Unknown ideal weight.).  Past Medical History:  Diagnosis Date  . Anginal pain ([place])   . Arthritis    ? 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 Followed by Dr. Trudie Reed  . Arthritis    "ankles" (04/13/2016)  . Asthma   . CORONARY ARTERY DISEASE    a. cath 05/2014: mild LAD disease, normal LCx and RCA. Risk factor modification recommended.  Marland Kitchen DIABETES MELLITUS, TYPE II   . Edema   . ERECTILE DYSFUNCTION, ORGANIC   . GERD   . Gout, unspecified   . Heart murmur   . HYPERLIPIDEMIA   . HYPERTENSION   . 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 Pericarditis    recurrent  . POLYNEUROPATHY   . PULMONARY SARCOIDOSIS  Mediastinal lymphadenopathy with biospy proven sarcodosis  . Seizures ([place])    "none in 4-5 years; don't know what kind; not related to alcohol" (04/13/2016)  . TESTICULAR HYPOFUNCTION   . TIA (transient ischemic attack)    "I don't remember when" (04/13/2016)    Current Outpatient Prescriptions on File Prior to Visit  Medication Sig Dispense Refill  . albuterol (PROVENTIL HFA;VENTOLIN HFA) 108 (90 Base) MCG/ACT inhaler Inhale 2 puffs into the lungs every 6 (six) hours as needed for wheezing or shortness of breath. 1 Inhaler 11  . allopurinol (ZYLOPRIM) 300 MG tablet Take 1 tablet (300 mg total) by mouth daily. 90 tablet 3  . amLODipine (NORVASC) 10 MG tablet Take 1 tablet (10 mg total) by mouth daily. 90 tablet 0  . aspirin EC 81 MG tablet Take 1 tablet (81 mg  total) by mouth daily. 30 tablet 3  . atorvastatin (LIPITOR) 40 MG tablet Take 1 tablet (40 mg total) by mouth daily at 6 PM. 30 tablet 12  . Blood Glucose Monitoring Suppl (ONETOUCH VERIO FLEX SYSTEM) w/Device KIT 1 Act by Does not apply route 3 (three) times daily with meals as needed. 2 kit 0  . cholecalciferol (VITAMIN D) 1000 units tablet Take 1,000 Units by mouth daily.    . colchicine 0.6 MG tablet Take 0.6 mg by mouth daily as needed.    Marland Kitchen dextromethorphan-guaiFENesin (MUCINEX DM) 30-600 MG 12hr tablet Take 1 tablet by mouth 2 (two) times daily as needed for cough. 14 tablet 0  . folic acid (FOLVITE) 1 MG tablet TK 1 T PO QD  1  . furosemide (LASIX) 40 MG tablet Take 1 tablet (40 mg total) by mouth daily. 90 tablet 1  . glucose blood (ONETOUCH VERIO) test strip Use TID 100 each 11  . HYDROcodone-homatropine (HYCODAN) 5-1.5 MG/5ML syrup Take 5 mLs by mouth at bedtime as needed for cough. 100 mL 0  . ibuprofen (ADVIL,MOTRIN) 600 MG tablet Take 1 tablet (600 mg total) by mouth every 6 (six) hours as needed. 30 tablet 0  . levofloxacin (LEVAQUIN) 500 MG tablet Take 1 tablet (500 mg total) by mouth daily. 7 tablet 0  . metFORMIN (GLUCOPHAGE-XR) 500 MG 24 hr tablet Take 1 tablet (500 mg total) by mouth 2 (two) times daily with a meal. 180 tablet 1  . methotrexate (RHEUMATREX) 2.5 MG tablet TK 4 TS PO 1 TIME A WK  1  . metoprolol (LOPRESSOR) 50 MG tablet TAKE 1 TABLET BY MOUTH TWICE DAILY 180 tablet 1  . nitroGLYCERIN (NITROSTAT) 0.4 MG SL tablet Place 1 tablet (0.4 mg total) under the tongue every 5 (five) minutes as needed for chest pain. 90 tablet 3  . potassium chloride SA (KLOR-CON M20) 20 MEQ tablet Take 1 tablet (20 mEq total) by mouth 2 (two) times daily. 180 tablet 1  . predniSONE (DELTASONE) 10 MG tablet 4 tabs for 5 days,  2 tabs for 1 week  then 1 tab for 1 week , then 1/2 tab for 1 week and stop 50 tablet 0  . tadalafil (CIALIS) 20 MG tablet Take 0.5-1 tablets (10-20 mg total) by  mouth every 3 (three) days as needed for erectile dysfunction. 10 tablet 11  . testosterone cypionate (DEPOTESTOSTERONE CYPIONATE) 200 MG/ML injection Inject 1 mL (200 mg total) into the muscle every 14 (fourteen) days. 10 mL 0  . traMADol (ULTRAM) 50 MG tablet Take 1 tablet (50 mg total) by mouth every 6 (six) hours as needed. 15 tablet 0  Current Facility-Administered Medications on File Prior to Visit  Medication Dose Route Frequency Provider Last Rate Last Dose  . testosterone cypionate (DEPOTESTOSTERONE CYPIONATE) injection 200 mg  200 mg Intramuscular Q14 Days Janith Lima, MD   200 mg at 05/07/16 1143  . testosterone cypionate (DEPOTESTOSTERONE CYPIONATE) injection 200 mg  200 mg Intramuscular Q28 days Janith Lima, MD   200 mg at 05/21/16 0857  . testosterone cypionate (DEPOTESTOSTERONE CYPIONATE) injection 200 mg  200 mg Intramuscular Q28 days Janith Lima, MD   200 mg at 06/04/16 1508    Allergies  Allergen Reactions  . Sulfonamide Derivatives Rash and Other (See Comments)     Assessment/Plan:  1. Hypertension - BP above goal <130/59mHg. Since pt is diabetic and hypertensive, will add ARB therapy (pt experienced dry cough with lisinopril). Will start valsartan '80mg'$  daily. Will also stop furosemide and start torsemide '20mg'$  daily to see if this helps with patient's fluid. Amlodipine '5mg'$  is patient's max dose given ankle swelling on '10mg'$  dose. F/u BMET in 1 week and HTN visit in 3 weeks.   Megan E. Supple, PharmD, CPP, [id number]. [id number] [place] [address]Phone: [phone] Fax: [phone]/07/2016 4:30 PM

## 2016-06-17 ENCOUNTER — Other Ambulatory Visit: Payer: Medicare Other | Admitting: *Deleted

## 2016-06-17 DIAGNOSIS — I10 Essential (primary) hypertension: Secondary | ICD-10-CM | POA: Diagnosis not present

## 2016-06-18 LAB — BASIC METABOLIC PANEL
BUN/Creatinine Ratio: 12 (ref 9–20)
BUN: 11 mg/dL (ref 6–24)
CO2: 25 mmol/L (ref 18–29)
Calcium: 9.5 mg/dL (ref 8.7–10.2)
Chloride: 98 mmol/L (ref 96–106)
Creatinine, Ser: 0.92 mg/dL (ref 0.76–1.27)
GFR calc Af Amer: 117 mL/min/{1.73_m2} (ref >59)
GFR calc non Af Amer: 102 mL/min/{1.73_m2} (ref >59)
Glucose: 227 mg/dL — ABNORMAL HIGH (ref 65–99)
Potassium: 4.6 mmol/L (ref 3.5–5.2)
Sodium: 141 mmol/L (ref 134–144)

## 2016-06-30 ENCOUNTER — Ambulatory Visit (INDEPENDENT_AMBULATORY_CARE_PROVIDER_SITE_OTHER): Payer: Medicare Other | Admitting: Pharmacist

## 2016-06-30 ENCOUNTER — Encounter: Payer: Self-pay | Admitting: Pharmacist

## 2016-06-30 ENCOUNTER — Other Ambulatory Visit: Payer: Self-pay | Admitting: Pulmonary Disease

## 2016-06-30 VITALS — BP 112/78 | HR 78 | Wt 279.5 lb

## 2016-06-30 DIAGNOSIS — R0600 Dyspnea, unspecified: Secondary | ICD-10-CM

## 2016-06-30 DIAGNOSIS — I10 Essential (primary) hypertension: Secondary | ICD-10-CM | POA: Diagnosis not present

## 2016-06-30 NOTE — Patient Instructions (Addendum)
Return for a follow up appointment in 4 weeks  Check your blood pressure at home daily (if able) and keep record of the readings.  Take your BP meds as follows: Continue torsemide 20mg  daily and valsartan 160mg  daily   Remain off amlodipine and metoprolol   Bring all of your meds, your BP cuff and your record of home blood pressures to your next appointment.  Exercise as you're able, try to walk approximately 30 minutes per day.  Keep salt intake to a minimum, especially watch canned and prepared boxed foods.  Eat more fresh fruits and vegetables and fewer canned items.  Avoid eating in fast food restaurants.    HOW TO TAKE YOUR BLOOD PRESSURE: . Rest 5 minutes before taking your blood pressure. .  Don't smoke or drink caffeinated beverages for at least 30 minutes before. . Take your blood pressure before (not after) you eat. . Sit comfortably with your back supported and both feet on the floor (don't cross your legs). . Elevate your arm to heart level on a table or a desk. . Use the proper sized cuff. It should fit smoothly and snugly around your bare upper arm. There should be enough room to slip a fingertip under the cuff. The bottom edge of the cuff should be 1 inch above the crease of the elbow. . Ideally, take 3 measurements at one sitting and record the average.

## 2016-06-30 NOTE — Progress Notes (Signed)
Patient ID: [name]                 DOB: May 01, 1971                      MRN: [id number]     HPI:n [name] is a 46 y.o. male patient of Dr. Meda Coffee with PMH below who presents today for hypertension follow up. He previously was seen by Fuller Canada, PharmD and at his most recent OV his lasix was changed to torsemide (to help with fluid retention) and he was started on valsartan.  He presents today and states that he was somewhat confused about the instructions on his medications. He reports that he stopped his metoprolol and amlodipine completely. He self titrated his valsartan to 18m daily about 1-1.5 weeks ago as his pressure had been running high at home. He reports since then his pressures have been running 140s/80s. He denies symptoms of low blood pressure and states he has been feeling well with his current medication regimen.   PMH: HTN, CAD (non-obstructive disease by cath in 05/2014), asthma, DM2, HLD, obesity, hypokalemia, gout, sarcoidosis  Current HTN meds:  Valsartan 1652mdaily - increase dose about 1 week ago Torsemide 2053maily Metoprolol 85m24mD - has not been taking  Amlodipine 5mg 89mly - has not been taking   Previously tried:  Higher doses of amlodipine - LLE HCTZ - d/ced d/t lasix Lisinopril - cough  BP goal: <130/80 mmHg  Family History:MI, decreased (father, 46)  33cial History: Former Smoker of cigarettes 1 PPD for 20 years. Quit smoking on 05/18/2014. Drinks 2 glasses of wine per month. Denies illicit drug use)  Exercise:Says he is active at baseline, exercising at the gym 3-4 times per week. Uses the stair climber, elliptical, and weight machines. Denies any chest discomfort or dyspnea with exertion while performing these activities, granted he has not exercised since this past weekend as he wanted to be seen in the office prior to resuming his workout routine   Wt Readings from Last 3 Encounters:  06/30/16 279 lb 8 oz (126.8 kg)    06/09/16 284 lb (128.8 kg)  05/24/16 287 lb 7 oz (130.4 kg)   BP Readings from Last 3 Encounters:  06/30/16 112/78  06/09/16 (!) 144/90  05/25/16 123/80   Pulse Readings from Last 3 Encounters:  06/30/16 78  06/09/16 78  05/25/16 98    Renal function: Estimated Creatinine Clearance: 148.5 mL/min (by C-G formula based on SCr of 0.92 mg/dL).  Past Medical History:  Diagnosis Date  . Anginal pain (HCC) [place] Arthritis    ? 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 Followed by Dr. HawkeTrudie Reedrthritis    "ankles" (04/13/2016)  . Asthma   . CORONARY ARTERY DISEASE    a. cath 05/2014: mild LAD disease, normal LCx and RCA. Risk factor modification recommended.  . DIAMarland KitchenETES MELLITUS, TYPE II   . Edema   . ERECTILE DYSFUNCTION, ORGANIC   . GERD   . Gout, unspecified   . Heart murmur   . HYPERLIPIDEMIA   . HYPERTENSION   . 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 Pericarditis    recurrent  . POLYNEUROPATHY   . PULMONARY SARCOIDOSIS    Mediastinal lymphadenopathy with biospy proven sarcodosis  . Seizures (HCC) [place]"none in 4-5 years; don't know what kind; not related to alcohol" (04/13/2016)  . TESTICULAR HYPOFUNCTION   .  TIA (transient ischemic attack)    "I don't remember when" (04/13/2016)    Current Outpatient Prescriptions on File Prior to Visit  Medication Sig Dispense Refill  . albuterol (PROVENTIL HFA;VENTOLIN HFA) 108 (90 Base) MCG/ACT inhaler Inhale 2 puffs into the lungs every 6 (six) hours as needed for wheezing or shortness of breath. 1 Inhaler 11  . allopurinol (ZYLOPRIM) 300 MG tablet Take 1 tablet (300 mg total) by mouth daily. 90 tablet 3  . aspirin EC 81 MG tablet Take 1 tablet (81 mg total) by mouth daily. 30 tablet 3  . atorvastatin (LIPITOR) 40 MG tablet Take 1 tablet (40 mg total) by mouth daily at 6 PM. 30 tablet 12  . Blood Glucose Monitoring Suppl (ONETOUCH VERIO FLEX SYSTEM) w/Device KIT 1 Act by Does not apply route  3 (three) times daily with meals as needed. 2 kit 0  . cholecalciferol (VITAMIN D) 1000 units tablet Take 1,000 Units by mouth daily.    . colchicine 0.6 MG tablet Take 0.6 mg by mouth daily as needed.    . folic acid (FOLVITE) 1 MG tablet TK 1 T PO QD  1  . glucose blood (ONETOUCH VERIO) test strip Use TID 100 each 11  . ibuprofen (ADVIL,MOTRIN) 600 MG tablet Take 1 tablet (600 mg total) by mouth every 6 (six) hours as needed. 30 tablet 0  . metFORMIN (GLUCOPHAGE-XR) 500 MG 24 hr tablet Take 1 tablet (500 mg total) by mouth 2 (two) times daily with a meal. 180 tablet 1  . methotrexate (RHEUMATREX) 2.5 MG tablet TK 4 TS PO 1 TIME A WK  1  . potassium chloride SA (KLOR-CON M20) 20 MEQ tablet Take 1 tablet (20 mEq total) by mouth 2 (two) times daily. 180 tablet 1  . tadalafil (CIALIS) 20 MG tablet Take 0.5-1 tablets (10-20 mg total) by mouth every 3 (three) days as needed for erectile dysfunction. 10 tablet 11  . testosterone cypionate (DEPOTESTOSTERONE CYPIONATE) 200 MG/ML injection Inject 1 mL (200 mg total) into the muscle every 14 (fourteen) days. 10 mL 0  . torsemide (DEMADEX) 20 MG tablet Take 1 tablet (20 mg total) by mouth daily. 30 tablet 11  . valsartan (DIOVAN) 80 MG tablet Take 1 tablet (80 mg total) by mouth daily. 30 tablet 11  . nitroGLYCERIN (NITROSTAT) 0.4 MG SL tablet Place 1 tablet (0.4 mg total) under the tongue every 5 (five) minutes as needed for chest pain. (Patient not taking: Reported on 06/30/2016) 90 tablet 3   Current Facility-Administered Medications on File Prior to Visit  Medication Dose Route Frequency Provider Last Rate Last Dose  . testosterone cypionate (DEPOTESTOSTERONE CYPIONATE) injection 200 mg  200 mg Intramuscular Q14 Days Janith Lima, MD   200 mg at 05/07/16 1143  . testosterone cypionate (DEPOTESTOSTERONE CYPIONATE) injection 200 mg  200 mg Intramuscular Q28 days Janith Lima, MD   200 mg at 05/21/16 0857  . testosterone cypionate (DEPOTESTOSTERONE  CYPIONATE) injection 200 mg  200 mg Intramuscular Q28 days Janith Lima, MD   200 mg at 06/04/16 1508    Allergies  Allergen Reactions  . Amlodipine Other (See Comments)    Ankle swelling with 11m, ok with 531mdose  . Sulfonamide Derivatives Rash and Other (See Comments)    Blood pressure 112/78, pulse 78, weight 279 lb 8 oz (126.8 kg), SpO2 99 %.   Assessment/Plan: Hypertension: BP today below goal. Recent BMET stable. With low-normal pressure will have him continue his current BP  regimen (valsartan 140m and torsemide 221mdaily) and not resume metoprolol or amlodipine at this time. Continue to monitor pressures at home. Follow up in hypertension clinic in 4 weeks for BP check and BMET.    Thank you, [facility]elan [facility]  06/30/2016 9:49 PM

## 2016-07-01 ENCOUNTER — Ambulatory Visit: Payer: Self-pay | Admitting: Pulmonary Disease

## 2016-07-20 ENCOUNTER — Other Ambulatory Visit: Payer: Self-pay | Admitting: Internal Medicine

## 2016-07-28 ENCOUNTER — Other Ambulatory Visit: Payer: Self-pay

## 2016-07-28 ENCOUNTER — Ambulatory Visit: Payer: Self-pay

## 2016-07-28 NOTE — Progress Notes (Deleted)
Patient ID: [name]                 DOB: [birth year]                      MRN: [id number]     HPI: [name] a 46 y.o.malepatient of Dr. Meda Coffee referred by to HTN clinic.PMH is significant for CAD (nonobstructive disease by cath in 05/2014), HTN, HLD, DM2, RA, edemaand chronic pulmonary sarcoidosis. Frequent prednisone tapers for sarcoidosis exacerbations likely contribute to hx of elevated BP readings. Pt was previously advised to start valsartan 35m daily and switch from Lasix to torsemide 270mdaily to help with abdominal swelling. Pt apparently misunderstood these instructions and self-titrated his valsartan to 16044maily and stopped taking his metoprolol and amlodipine.    Current HTN meds:valsartan 67m26mily, amlodipine 5 mg daily, lasix 40 mg daily, metoprolol 50 mg BID Previously tried:HCTZ 25 mg daily (d/c'd b/c on loop diuretic), lisinopril - cough, amlodipine 10mg30mly - ankle swelling BP goal: <130/80 mmHg  Family History:MI, decreased (father, 46)  29cial History: Former Smoker of cigarettes 1 PPD for 20 years. Quit smoking on 05/18/2014. Drinks 2 glasses of wine per month. Denies illicit drug use.   Exercise:Says he is active at baseline, exercising at the gym 3-4 times per week. Uses the stair climber, elliptical, and weight machines. Denies any chest discomfort or dyspnea with exertion while performing these activities, granted he has not exercised since this past weekend as he wanted to be seen in the office prior to resuming his workout routine  Home BP readings:BP measured at 162/108 mmHg the day after stopping HCTZ but pt had a lot of CP at the time associated with sarcoidosis exacerbation. His home BP yesterday morning was 129/73 mmHg and this AM was 154/86 mmHg.   Wt Readings from Last 3 Encounters:  06/30/16 279 lb 8 oz (126.8 kg)  06/09/16 284 lb (128.8 kg)  05/24/16 287 lb 7 oz (130.4 kg)   BP Readings from Last 3 Encounters:    06/30/16 112/78  06/09/16 (!) 144/90  05/25/16 123/80   Pulse Readings from Last 3 Encounters:  06/30/16 78  06/09/16 78  05/25/16 98    Renal function: CrCl cannot be calculated (Patient's most recent lab result is older than the maximum 21 days allowed.).  Past Medical History:  Diagnosis Date  . Anginal pain (HCC) [place] Arthritis    ? 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 Followed by Dr. HawkeTrudie Reedrthritis    "ankles" (04/13/2016)  . Asthma   . CORONARY ARTERY DISEASE    a. cath 05/2014: mild LAD disease, normal LCx and RCA. Risk factor modification recommended.  . DIAMarland KitchenETES MELLITUS, TYPE II   . Edema   . ERECTILE DYSFUNCTION, ORGANIC   . GERD   . Gout, unspecified   . Heart murmur   . HYPERLIPIDEMIA   . HYPERTENSION   . 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 Pericarditis    recurrent  . POLYNEUROPATHY   . PULMONARY SARCOIDOSIS    Mediastinal lymphadenopathy with biospy proven sarcodosis  . Seizures (HCC) [place]"none in 4-5 years; don't know what kind; not related to alcohol" (04/13/2016)  . TESTICULAR HYPOFUNCTION   . TIA (transient ischemic attack)    "I don't remember when" (04/13/2016)    Current Outpatient Prescriptions on File Prior to Visit  Medication Sig Dispense Refill  .  albuterol (PROVENTIL HFA;VENTOLIN HFA) 108 (90 Base) MCG/ACT inhaler Inhale 2 puffs into the lungs every 6 (six) hours as needed for wheezing or shortness of breath. 1 Inhaler 11  . allopurinol (ZYLOPRIM) 300 MG tablet Take 1 tablet (300 mg total) by mouth daily. 90 tablet 3  . aspirin EC 81 MG tablet Take 1 tablet (81 mg total) by mouth daily. 30 tablet 3  . atorvastatin (LIPITOR) 40 MG tablet Take 1 tablet (40 mg total) by mouth daily at 6 PM. 30 tablet 12  . Blood Glucose Monitoring Suppl (ONETOUCH VERIO FLEX SYSTEM) w/Device KIT 1 Act by Does not apply route 3 (three) times daily with meals as needed. 2 kit 0  . cholecalciferol (VITAMIN D)  1000 units tablet Take 1,000 Units by mouth daily.    . colchicine 0.6 MG tablet Take 0.6 mg by mouth daily as needed.    . folic acid (FOLVITE) 1 MG tablet TK 1 T PO QD  1  . glucose blood (ONETOUCH VERIO) test strip Use TID 100 each 11  . ibuprofen (ADVIL,MOTRIN) 600 MG tablet Take 1 tablet (600 mg total) by mouth every 6 (six) hours as needed. 30 tablet 0  . metFORMIN (GLUCOPHAGE-XR) 500 MG 24 hr tablet Take 1 tablet (500 mg total) by mouth 2 (two) times daily with a meal. 180 tablet 1  . methotrexate (RHEUMATREX) 2.5 MG tablet TK 4 TS PO 1 TIME A WK  1  . nitroGLYCERIN (NITROSTAT) 0.4 MG SL tablet Place 1 tablet (0.4 mg total) under the tongue every 5 (five) minutes as needed for chest pain. (Patient not taking: Reported on 06/30/2016) 90 tablet 3  . potassium chloride SA (K-DUR,KLOR-CON) 20 MEQ tablet TAKE 1 TABLET(20 MEQ) BY MOUTH TWICE DAILY 180 tablet 1  . tadalafil (CIALIS) 20 MG tablet Take 0.5-1 tablets (10-20 mg total) by mouth every 3 (three) days as needed for erectile dysfunction. 10 tablet 11  . testosterone cypionate (DEPOTESTOSTERONE CYPIONATE) 200 MG/ML injection Inject 1 mL (200 mg total) into the muscle every 14 (fourteen) days. 10 mL 0  . torsemide (DEMADEX) 20 MG tablet Take 1 tablet (20 mg total) by mouth daily. 30 tablet 11  . valsartan (DIOVAN) 80 MG tablet Take 1 tablet (80 mg total) by mouth daily. 30 tablet 11   Current Facility-Administered Medications on File Prior to Visit  Medication Dose Route Frequency Provider Last Rate Last Dose  . testosterone cypionate (DEPOTESTOSTERONE CYPIONATE) injection 200 mg  200 mg Intramuscular Q14 Days Janith Lima, MD   200 mg at 05/07/16 1143  . testosterone cypionate (DEPOTESTOSTERONE CYPIONATE) injection 200 mg  200 mg Intramuscular Q28 days Janith Lima, MD   200 mg at 05/21/16 0857  . testosterone cypionate (DEPOTESTOSTERONE CYPIONATE) injection 200 mg  200 mg Intramuscular Q28 days Janith Lima, MD   200 mg at 06/04/16  1508    Allergies  Allergen Reactions  . Amlodipine Other (See Comments)    Ankle swelling with 39m, ok with 568mdose  . Sulfonamide Derivatives Rash and Other (See Comments)     Assessment/Plan:

## 2016-07-29 ENCOUNTER — Other Ambulatory Visit: Payer: Self-pay | Admitting: Cardiology

## 2016-07-29 ENCOUNTER — Telehealth: Payer: Self-pay | Admitting: *Deleted

## 2016-07-29 MED ORDER — VALSARTAN 160 MG PO TABS: 160.0000 mg | ORAL_TABLET | Freq: Every day | ORAL | 1 refills | Status: DC

## 2016-07-29 NOTE — Telephone Encounter (Signed)
Rx has been sent for 160mg  tablets to pharmacy.

## 2016-07-29 NOTE — Telephone Encounter (Signed)
Patient stated that he was instructed to take 160 mg of valsartan qd. There is not an order on his chart for this, but I do see at his last visit it says to continue valsartan 160 mg. Should this be refilled for the 160 mg tablets or 80 mg tablets written for him to take two tablets qd? Please advise. Thanks, MI

## 2016-08-03 ENCOUNTER — Other Ambulatory Visit: Payer: Self-pay | Admitting: *Deleted

## 2016-08-03 MED ORDER — COLCHICINE 0.6 MG PO TABS: 0.6000 mg | ORAL_TABLET | Freq: Every day | ORAL | 0 refills | Status: DC | PRN

## 2016-08-13 DIAGNOSIS — M5136 Other intervertebral disc degeneration, lumbar region: Secondary | ICD-10-CM | POA: Diagnosis not present

## 2016-08-18 DIAGNOSIS — I158 Other secondary hypertension: Secondary | ICD-10-CM | POA: Diagnosis not present

## 2016-08-18 DIAGNOSIS — M109 Gout, unspecified: Secondary | ICD-10-CM | POA: Diagnosis not present

## 2016-08-18 DIAGNOSIS — E119 Type 2 diabetes mellitus without complications: Secondary | ICD-10-CM | POA: Diagnosis not present

## 2016-08-18 DIAGNOSIS — D860 Sarcoidosis of lung: Secondary | ICD-10-CM | POA: Diagnosis not present

## 2016-08-24 DIAGNOSIS — S134XXA Sprain of ligaments of cervical spine, initial encounter: Secondary | ICD-10-CM | POA: Diagnosis not present

## 2016-08-27 ENCOUNTER — Telehealth: Payer: Self-pay | Admitting: *Deleted

## 2016-08-27 NOTE — Telephone Encounter (Signed)
Patient called and stated that he went to pick up the torsemide yesterday and the pharmacy informed him that this medication is not covered. His insurance is supposed to fax a form to the office today.

## 2016-08-28 ENCOUNTER — Telehealth: Payer: Self-pay | Admitting: Cardiology

## 2016-08-28 ENCOUNTER — Telehealth: Payer: Self-pay

## 2016-08-28 NOTE — Telephone Encounter (Signed)
Will route this message accordingly to our prior authorization Nurse for further review and follow-up with [employer].

## 2016-08-28 NOTE — Telephone Encounter (Signed)
New Message     Provider calling regarding the preauthorization for  torsemide (DEMADEX) 20 MG tablet Take 1 tablet (20 mg total) by mouth daily   This is 2nd attempt and they will deny if information is not sent

## 2016-08-28 NOTE — Telephone Encounter (Signed)
Prior auth for Torsemide 20mg  submitted to [facility].

## 2016-08-28 NOTE — Telephone Encounter (Signed)
**Note De-Identified [name] Obfuscation** See phone note from 3/23.

## 2016-08-28 NOTE — Telephone Encounter (Signed)
Will route this to our prior auth Nurse as an fyi.

## 2016-08-28 NOTE — Telephone Encounter (Signed)
Prior auth for Torsemide cancelled. Patient apparently has a discount card for the medicine, so no PA is needed.

## 2016-08-28 NOTE — Telephone Encounter (Signed)
New Message     torsemide (DEMADEX) 20 MG tablet Take 1 tablet (20 mg total) by mouth daily   This has been approved for 1 year, they are mailing a letter with the approval and fax

## 2016-08-28 NOTE — Telephone Encounter (Signed)
Prior auth sbmitted to [facility].

## 2016-09-01 ENCOUNTER — Telehealth: Payer: Self-pay

## 2016-09-01 NOTE — Telephone Encounter (Signed)
Letter from [facility] noting approval of Torsemide 20mg  tabs. This is valid through 08/28/2017.

## 2016-09-08 DIAGNOSIS — E119 Type 2 diabetes mellitus without complications: Secondary | ICD-10-CM | POA: Diagnosis not present

## 2016-09-08 DIAGNOSIS — H524 Presbyopia: Secondary | ICD-10-CM | POA: Diagnosis not present

## 2016-09-08 LAB — HM DIABETES EYE EXAM

## 2016-09-16 ENCOUNTER — Other Ambulatory Visit: Payer: Self-pay | Admitting: Internal Medicine

## 2016-09-24 ENCOUNTER — Encounter: Payer: Self-pay | Admitting: Internal Medicine

## 2016-09-24 NOTE — Progress Notes (Signed)
Result abstracted 

## 2016-09-26 ENCOUNTER — Other Ambulatory Visit: Payer: Self-pay | Admitting: Cardiology

## 2016-10-25 ENCOUNTER — Other Ambulatory Visit: Payer: Self-pay | Admitting: Nurse Practitioner

## 2016-10-25 DIAGNOSIS — E291 Testicular hypofunction: Secondary | ICD-10-CM

## 2016-11-11 ENCOUNTER — Other Ambulatory Visit: Payer: Self-pay | Admitting: Sports Medicine

## 2016-11-11 DIAGNOSIS — S83241A Other tear of medial meniscus, current injury, right knee, initial encounter: Secondary | ICD-10-CM

## 2016-11-17 DIAGNOSIS — R350 Frequency of micturition: Secondary | ICD-10-CM | POA: Diagnosis not present

## 2016-11-17 DIAGNOSIS — N401 Enlarged prostate with lower urinary tract symptoms: Secondary | ICD-10-CM | POA: Diagnosis not present

## 2016-11-17 DIAGNOSIS — E291 Testicular hypofunction: Secondary | ICD-10-CM | POA: Diagnosis not present

## 2016-11-18 ENCOUNTER — Other Ambulatory Visit: Payer: Self-pay | Admitting: Internal Medicine

## 2016-11-23 ENCOUNTER — Telehealth: Payer: Self-pay | Admitting: Internal Medicine

## 2016-11-23 DIAGNOSIS — M109 Gout, unspecified: Secondary | ICD-10-CM | POA: Diagnosis not present

## 2016-11-23 DIAGNOSIS — D860 Sarcoidosis of lung: Secondary | ICD-10-CM | POA: Diagnosis not present

## 2016-11-23 DIAGNOSIS — I158 Other secondary hypertension: Secondary | ICD-10-CM | POA: Diagnosis not present

## 2016-11-23 DIAGNOSIS — E119 Type 2 diabetes mellitus without complications: Secondary | ICD-10-CM | POA: Diagnosis not present

## 2016-11-23 NOTE — Telephone Encounter (Signed)
error 

## 2016-11-24 ENCOUNTER — Other Ambulatory Visit: Payer: Self-pay

## 2016-11-24 ENCOUNTER — Ambulatory Visit
Admission: RE | Admit: 2016-11-24 | Discharge: 2016-11-24 | Disposition: A | Discharge status: Home / Self Care | Payer: Medicare Other | Source: Ambulatory Visit | Attending: Sports Medicine | Admitting: Sports Medicine

## 2016-11-24 DIAGNOSIS — M25561 Pain in right knee: Secondary | ICD-10-CM | POA: Diagnosis not present

## 2016-11-24 DIAGNOSIS — S83241A Other tear of medial meniscus, current injury, right knee, initial encounter: Secondary | ICD-10-CM

## 2016-11-25 ENCOUNTER — Telehealth: Payer: Self-pay | Admitting: Endocrinology

## 2016-11-25 DIAGNOSIS — M1711 Unilateral primary osteoarthritis, right knee: Secondary | ICD-10-CM | POA: Diagnosis not present

## 2016-11-25 NOTE — Telephone Encounter (Signed)
Patient would like to set up a new patient appointment with Dr. Everardo All.

## 2016-11-27 ENCOUNTER — Other Ambulatory Visit: Payer: Self-pay | Admitting: Internal Medicine

## 2016-12-08 ENCOUNTER — Encounter: Payer: Self-pay | Admitting: Endocrinology

## 2016-12-08 ENCOUNTER — Ambulatory Visit (INDEPENDENT_AMBULATORY_CARE_PROVIDER_SITE_OTHER): Payer: Medicare Other | Admitting: Endocrinology

## 2016-12-08 VITALS — BP 132/78 | HR 95 | Ht 75.0 in | Wt 286.0 lb

## 2016-12-08 DIAGNOSIS — E1159 Type 2 diabetes mellitus with other circulatory complications: Secondary | ICD-10-CM

## 2016-12-08 DIAGNOSIS — E669 Obesity, unspecified: Principal | ICD-10-CM

## 2016-12-08 DIAGNOSIS — E1169 Type 2 diabetes mellitus with other specified complication: Secondary | ICD-10-CM

## 2016-12-08 LAB — POCT GLYCOSYLATED HEMOGLOBIN (HGB A1C): Hemoglobin A1C: 7.9

## 2016-12-08 MED ORDER — SITAGLIPTIN PHOSPHATE 100 MG PO TABS: 100.0000 mg | ORAL_TABLET | Freq: Every day | ORAL | 11 refills | Status: DC

## 2016-12-08 NOTE — Progress Notes (Signed)
Subjective:    Patient ID: [name], male    DOB: [birth year], 46 y.o.   MRN: [id number]  HPI pt is referred by Dr Ronnald Ramp, for diabetes.  Pt states DM was dx'ed in 2008; he has mild if any neuropathy of the lower extremities, but he has associated CAD; he has never been on insulin; pt says his diet and exercise are not good; he has never had pancreatitis, pancreatic surgery, severe hypoglycemia or DKA.  He last took prednisone 5 mos ago.  He says cbg's are in the 100's-200's. Past Medical History:  Diagnosis Date  . Anginal pain ([place])   . Arthritis    ? 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 Followed by Dr. Trudie Reed  . Arthritis    "ankles" (04/13/2016)  . Asthma   . CORONARY ARTERY DISEASE    a. cath 05/2014: mild LAD disease, normal LCx and RCA. Risk factor modification recommended.  Marland Kitchen DIABETES MELLITUS, TYPE II   . Edema   . ERECTILE DYSFUNCTION, ORGANIC   . GERD   . Gout, unspecified   . Heart murmur   . HYPERLIPIDEMIA   . HYPERTENSION   . 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 Pericarditis    recurrent  . POLYNEUROPATHY   . PULMONARY SARCOIDOSIS    Mediastinal lymphadenopathy with biospy proven sarcodosis  . Seizures ([place])    "none in 4-5 years; don't know what kind; not related to alcohol" (04/13/2016)  . TESTICULAR HYPOFUNCTION   . TIA (transient ischemic attack)    "I don't remember when" (04/13/2016)    Past Surgical History:  Procedure Laterality Date  . BRONCHOSCOPY  08/21/08  . CARDIAC CATHETERIZATION  06/25/2009   minimal disease  . CARDIAC CATHETERIZATION N/A 04/14/2016   Procedure: Left Heart Cath and Coronary Angiography;  Surgeon: Burnell Blanks, MD;  Location: [facility];  Service: Cardiovascular;  Laterality: N/A;  . LEFT HEART CATHETERIZATION WITH CORONARY ANGIOGRAM N/A 05/18/2014   Procedure: LEFT HEART CATHETERIZATION WITH CORONARY ANGIOGRAM;  Surgeon: Jettie Booze, MD;  Location: [facility]  CATH LAB;  Service: Cardiovascular;  Laterality: N/A;  . MEDIASTINOSCOPY  11/30/08    Social History   Social History  . Marital status: Significant Other    Spouse name: N/A  . Number of children: N/A  . Years of education: N/A   Occupational History  . Not on file.   Social History Main Topics  . Smoking status: Former Smoker    Packs/day: 1.00    Years: 20.00    Types: Cigarettes    Quit date: 05/18/2014  . Smokeless tobacco: Never Used  . Alcohol use 0.0 oz/week     Comment: 04/13/2016 "glass of wine a couple times/month"  . Drug use: No  . Sexual activity: Yes   Other Topics Concern  . Not on file   Social History Narrative   Married, lives with wife and 3 kids.    Currently student. 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 albuterol (PROVENTIL HFA;VENTOLIN HFA) 108 (90 Base) MCG/ACT inhaler Inhale 2 puffs into the lungs every 6 (six) hours as needed for wheezing or shortness of breath. 1 Inhaler 11  . allopurinol (ZYLOPRIM) 300 MG tablet TAKE 1 TABLET(300 MG) BY MOUTH DAILY 90 tablet 0  . aspirin EC 81 MG tablet Take 1 tablet (81 mg total)  by mouth daily. 30 tablet 3  . atorvastatin (LIPITOR) 40 MG tablet Take 1 tablet (40 mg total) by mouth daily at 6 PM. 30 tablet 12  . Blood Glucose Monitoring Suppl (ONETOUCH VERIO FLEX SYSTEM) w/Device KIT 1 Act by Does not apply route 3 (three) times daily with meals as needed. 2 kit 0  . cholecalciferol (VITAMIN D) 1000 units tablet Take 1,000 Units by mouth daily.    . colchicine 0.6 MG tablet Take 1 tablet (0.6 mg total) by mouth daily as needed. 30 tablet 0  . folic acid (FOLVITE) 1 MG tablet TK 1 T PO QD  1  . glucose blood (ONETOUCH VERIO) test strip Use TID 100 each 11  . ibuprofen (ADVIL,MOTRIN) 600 MG tablet Take 1 tablet (600 mg total) by mouth every 6 (six) hours as needed. 30 tablet 0  . metFORMIN (GLUCOPHAGE-XR)  500 MG 24 hr tablet TAKE 1 TABLET(500 MG) BY MOUTH TWICE DAILY WITH A MEAL 180 tablet 0  . methotrexate (RHEUMATREX) 2.5 MG tablet TK 4 TS PO 1 TIME A WK  1  . nitroGLYCERIN (NITROSTAT) 0.4 MG SL tablet Place 1 tablet (0.4 mg total) under the tongue every 5 (five) minutes as needed for chest pain. 90 tablet 3  . potassium chloride SA (K-DUR,KLOR-CON) 20 MEQ tablet TAKE 1 TABLET(20 MEQ) BY MOUTH TWICE DAILY 180 tablet 1  . tadalafil (CIALIS) 20 MG tablet Take 0.5-1 tablets (10-20 mg total) by mouth every 3 (three) days as needed for erectile dysfunction. 10 tablet 11  . testosterone cypionate (DEPOTESTOSTERONE CYPIONATE) 200 MG/ML injection INJECT 1 ML IN THE MUSCLE EVERY 14 DAYS 10 mL 0  . valsartan (DIOVAN) 160 MG tablet TAKE 1 TABLET(160 MG) BY MOUTH DAILY 30 tablet 5  . torsemide (DEMADEX) 20 MG tablet Take 1 tablet (20 mg total) by mouth daily. 30 tablet 11   Current Facility-Administered Medications on File Prior to Visit  Medication Dose Route Frequency Provider Last Rate Last Dose  . testosterone cypionate (DEPOTESTOSTERONE CYPIONATE) injection 200 mg  200 mg Intramuscular Q14 Days Janith Lima, MD   200 mg at 05/07/16 1143  . testosterone cypionate (DEPOTESTOSTERONE CYPIONATE) injection 200 mg  200 mg Intramuscular Q28 days Janith Lima, MD   200 mg at 05/21/16 0857  . testosterone cypionate (DEPOTESTOSTERONE CYPIONATE) injection 200 mg  200 mg Intramuscular Q28 days Janith Lima, MD   200 mg at 06/04/16 1508    Allergies  Allergen Reactions  . Amlodipine Other (See Comments)    Ankle swelling with '10mg'$ , ok with '5mg'$  dose  . Sulfonamide Derivatives Rash and Other (See Comments)    Family History  Problem Relation Age of Onset  . Diabetes Mother   . Hypertension Mother   . Diabetes Father   . Heart disease Father 38       Fatal MI  . Heart failure Father     BP 132/78   Pulse 95   Ht '6\' 3"'$  (1.905 m)   Wt 286 lb (129.7 kg)   SpO2 97%   BMI 35.75 kg/m    Review  of Systems denies blurry vision, chest pain, sob, n/v, dry skin, depression, cold intolerance, rhinorrhea, and easy bruising.  He has weight gain, urinary frequency, leg cramps, and intermitt headache.      Objective:   Physical Exam VS: see vs page GEN: no distress HEAD: head: no deformity eyes: no periorbital swelling, no proptosis external nose and ears are normal mouth: no lesion  seen NECK: supple, thyroid is not enlarged CHEST WALL: no deformity LUNGS: clear to auscultation CV: reg rate and rhythm, no murmur ABD: abdomen is soft, nontender.  no hepatosplenomegaly.  not distended.  no hernia MUSCULOSKELETAL: muscle bulk and strength are grossly normal.  no obvious joint swelling.  gait is normal and steady EXTEMITIES: no deformity.  no ulcer on the feet.  feet are of normal color and temp.  no edema PULSES: dorsalis pedis intact bilat.  no carotid bruit NEURO:  cn 2-12 grossly intact.   readily moves all 4's.  sensation is intact to touch on the feet SKIN:  Normal texture and temperature.  No rash or suspicious lesion is visible.   NODES:  None palpable at the neck PSYCH: alert, well-oriented.  Does not appear anxious nor depressed.   A1c=7.9%  I personally reviewed electrocardiogram tracing (04/13/16):  Indication: chest pain Impression: NSR.   No hypertrophy.  LVH with repol. abnormality. Compared to 04/07/16: no significant change.      Assessment & Plan:  Type 2 DM, with CAD: he needs increased rx, if it can be done with a regimen that avoids or minimizes hypoglycemia.    Patient Instructions  good diet and exercise significantly improve the control of your diabetes.  please let me know if you wish to be referred to a dietician.  high blood sugar is very risky to your health.  you should see an eye doctor and dentist every year.  It is very important to get all recommended vaccinations.  Controlling your blood pressure and cholesterol drastically reduces the damage  diabetes does to your body.  Those who smoke should quit.  Please discuss these with your doctor.  check your blood sugar once a day.  vary the time of day when you check, between before the 3 meals, and at bedtime.  also check if you have symptoms of your blood sugar being too high or too low.  please keep a record of the readings and bring it to your next appointment here (or you can bring the meter itself).  You can write it on any piece of paper.  please call us sooner if your blood sugar goes below 70, or if you have a lot of readings over 200.  Please continue the same metformin, and: I have sent a prescription to your pharmacy, to add "januvia." At our office, we are fortunate to have two specialists who are happy to help you:   Leonia Reader, RN, CDE, is a diabetes educator and pump trainer.  She is here on Monday mornings, and all day Tuesday and Wednesday.  She is can help you with low blood sugar avoidance and treatment, injecting insulin, sick day management, and others.   Antonieta Iba, RD is our dietician.  She is here all day Thursday and Friday.  She can advise you about a healthy diet.  She can also help you about a variety of special diabetes situations, such as shift work, Actor, gluten-free, diet for kidney patients, traveling with diabetes, and help for those who need to gain weight.  Please come back for a follow-up appointment in 3 months.

## 2016-12-08 NOTE — Patient Instructions (Addendum)
good diet and exercise significantly improve the control of your diabetes.  please let me know if you wish to be referred to a dietician.  high blood sugar is very risky to your health.  you should see an eye doctor and dentist every year.  It is very important to get all recommended vaccinations.  Controlling your blood pressure and cholesterol drastically reduces the damage diabetes does to your body.  Those who smoke should quit.  Please discuss these with your doctor.  check your blood sugar once a day.  vary the time of day when you check, between before the 3 meals, and at bedtime.  also check if you have symptoms of your blood sugar being too high or too low.  please keep a record of the readings and bring it to your next appointment here (or you can bring the meter itself).  You can write it on any piece of paper.  please call us sooner if your blood sugar goes below 70, or if you have a lot of readings over 200.  Please continue the same metformin, and: I have sent a prescription to your pharmacy, to add "januvia." At our office, we are fortunate to have two specialists who are happy to help you:   Cristy Folks, RN, CDE, is a diabetes educator and pump trainer.  She is here on Monday mornings, and all day Tuesday and Wednesday.  She is can help you with low blood sugar avoidance and treatment, injecting insulin, sick day management, and others.   Oran Rein, RD is our dietician.  She is here all day Thursday and Friday.  She can advise you about a healthy diet.  She can also help you about a variety of special diabetes situations, such as shift work, Animal nutritionist, gluten-free, diet for kidney patients, traveling with diabetes, and help for those who need to gain weight.  Please come back for a follow-up appointment in 3 months.

## 2016-12-14 ENCOUNTER — Telehealth: Payer: Self-pay | Admitting: Cardiology

## 2016-12-14 NOTE — Telephone Encounter (Signed)
LMTCB

## 2016-12-14 NOTE — Telephone Encounter (Signed)
Per pharmacist-pt advised to contact pharmacy to see if his valsartan is made by manufacturer of effected valsartan supply.

## 2016-12-14 NOTE — Telephone Encounter (Signed)
New Message:   Pt says he does not want to take the Valsartan,he wants to take something else please.

## 2016-12-17 ENCOUNTER — Other Ambulatory Visit: Payer: Self-pay | Admitting: Internal Medicine

## 2016-12-17 DIAGNOSIS — E291 Testicular hypofunction: Secondary | ICD-10-CM | POA: Diagnosis not present

## 2016-12-18 NOTE — Telephone Encounter (Signed)
Pt needs an appt to receive refills. Can you call pt?

## 2016-12-18 NOTE — Telephone Encounter (Signed)
Left patient vm to call back to schedule.  °

## 2016-12-21 ENCOUNTER — Other Ambulatory Visit: Payer: Self-pay | Admitting: Internal Medicine

## 2016-12-22 ENCOUNTER — Telehealth: Payer: Self-pay | Admitting: Cardiology

## 2016-12-22 NOTE — Telephone Encounter (Signed)
New message      Pt c/o medication issue:  1. Name of Medication: Valsartan   2. How are you currently taking this medication (dosage and times per day)?  3. Are you having a reaction (difficulty breathing--STAT)?no  4. What is your medication issue? Medication was recalled he would like you to put him on something else , the pharmacist told him to stop taking it

## 2016-12-22 NOTE — Telephone Encounter (Signed)
PT REFUSING TO TAKE  VALSARTAN  NEED AN ALTERNATIVE MED  WILL FORWARD TO DR Delton See FOR RECOMMENDATIONS .Zack Seal

## 2016-12-23 MED ORDER — LOSARTAN POTASSIUM 50 MG PO TABS: 50.0000 mg | ORAL_TABLET | Freq: Every day | ORAL | 3 refills | Status: DC

## 2016-12-23 NOTE — Telephone Encounter (Signed)
Notified the pt that per Dr Delton See, due to the recent recall of Valsartan, we will discontinue this med, and switch him to Losartan 50 mg po daily instead.  Confirmed the pharmacy of choice with the pt.  Pt verbalized understanding and agrees with this plan.

## 2016-12-23 NOTE — Telephone Encounter (Signed)
Valsartan has been recalled at all pharmacies but [facility].  Pt doesn't want to take this at all.  Please advise on a different regimen, in place of Valsartan.

## 2016-12-23 NOTE — Telephone Encounter (Signed)
Please switch to losartan 50 mg po daily

## 2016-12-29 ENCOUNTER — Other Ambulatory Visit: Payer: Self-pay | Admitting: Internal Medicine

## 2017-01-15 NOTE — Progress Notes (Signed)
Subjective:   [name] is a 46 y.o. male who presents for an Initial Medicare Annual Wellness Visit.  Review of Systems  No ROS.  Medicare Wellness Visit. Additional risk factors are reflected in the social history.  Cardiac Risk Factors include: diabetes mellitus;dyslipidemia;hypertension;male gender Sleep patterns: feels rested on waking, gets up 1 times nightly to void and sleeps 6-7 hours nightly.    Home Safety/Smoke Alarms: Feels safe in home. Smoke alarms in place.  Living environment; residence and Firearm Safety: 2-story house, no firearms. Lives with friend, no needs for DME, good support system Seat Belt Safety/Bike Helmet: Wears seat belt.   Counseling:   Eye Exam- appointment yearly, Dr. E[facility]- Resources provided  Male:   CCS- N/A, due to age 82 years old    PSA-  Lab Results  Component Value Date   PSA 1.33 05/07/2016      Objective:    Today's Vitals   01/18/17 1412 01/18/17 1457  BP: (!) 162/86 (!) 168/102  Pulse: 91 76  Resp: 20 16  Temp: 98.7 F (37.1 C) 98.1 F (36.7 C)  TempSrc:  Oral  SpO2: 99% 98%  Weight: 294 lb (133.4 kg) 294 lb (133.4 kg)  Height: 6' 3" (1.905 m)    Body mass index is 36.75 kg/m.  Current Medications (verified) Outpatient Encounter Prescriptions as of 01/18/2017  Medication Sig  . albuterol (PROVENTIL HFA;VENTOLIN HFA) 108 (90 Base) MCG/ACT inhaler Inhale 2 puffs into the lungs every 6 (six) hours as needed for wheezing or shortness of breath.  . allopurinol (ZYLOPRIM) 300 MG tablet TAKE 1 TABLET(300 MG) BY MOUTH DAILY  . aspirin EC 81 MG tablet Take 1 tablet (81 mg total) by mouth daily.  Marland Kitchen atorvastatin (LIPITOR) 40 MG tablet Take 1 tablet (40 mg total) by mouth daily at 6 PM.  . Blood Glucose Monitoring Suppl (ONETOUCH VERIO FLEX SYSTEM) w/Device KIT 1 Act by Does not apply route 3 (three) times daily with meals as needed.  . cholecalciferol (VITAMIN D) 1000 units tablet Take 1,000 Units by mouth  daily.  . colchicine 0.6 MG tablet Take 1 tablet (0.6 mg total) by mouth daily as needed.  . folic acid (FOLVITE) 1 MG tablet TK 1 T PO QD  . glucose blood (ONETOUCH VERIO) test strip Use TID  . ibuprofen (ADVIL,MOTRIN) 600 MG tablet Take 1 tablet (600 mg total) by mouth every 6 (six) hours as needed.  . metFORMIN (GLUCOPHAGE-XR) 500 MG 24 hr tablet TAKE 1 TABLET(500 MG) BY MOUTH TWICE DAILY WITH A MEAL  . methotrexate (RHEUMATREX) 2.5 MG tablet TK 4 TS PO 1 TIME A WK  . nitroGLYCERIN (NITROSTAT) 0.4 MG SL tablet Place 1 tablet (0.4 mg total) under the tongue every 5 (five) minutes as needed for chest pain.  . potassium chloride SA (K-DUR,KLOR-CON) 20 MEQ tablet TAKE 1 TABLET(20 MEQ) BY MOUTH TWICE DAILY  . sitaGLIPtin ([name]) 100 MG tablet Take 1 tablet (100 mg total) by mouth daily.  . tadalafil (CIALIS) 20 MG tablet Take 0.5-1 tablets (10-20 mg total) by mouth every 3 (three) days as needed for erectile dysfunction.  . [DISCONTINUED] losartan (COZAAR) 50 MG tablet Take 1 tablet (50 mg total) by mouth daily.  . irbesartan-hydrochlorothiazide (AVALIDE) 300-12.5 MG tablet Take 1 tablet by mouth daily.  Marland Kitchen torsemide (DEMADEX) 20 MG tablet Take 1 tablet (20 mg total) by mouth daily.  . [DISCONTINUED] potassium chloride SA (K-DUR,KLOR-CON) 20 MEQ tablet TAKE 1 TABLET(20 MEQ) BY MOUTH TWICE  DAILY  . [DISCONTINUED] testosterone cypionate (DEPOTESTOSTERONE CYPIONATE) 200 MG/ML injection INJECT 1 ML IN THE MUSCLE EVERY 14 DAYS (Patient not taking: Reported on 01/18/2017)   Facility-Administered Encounter Medications as of 01/18/2017  Medication  . testosterone cypionate (DEPOTESTOSTERONE CYPIONATE) injection 200 mg  . testosterone cypionate (DEPOTESTOSTERONE CYPIONATE) injection 200 mg  . testosterone cypionate (DEPOTESTOSTERONE CYPIONATE) injection 200 mg    Allergies (verified) Amlodipine and Sulfonamide derivatives   History: Past Medical History:  Diagnosis Date  . Anginal pain ([place])   .  Arthritis    ? 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 Followed by Dr. Trudie Reed  . Arthritis    "ankles" (04/13/2016)  . Asthma   . CORONARY ARTERY DISEASE    a. cath 05/2014: mild LAD disease, normal LCx and RCA. Risk factor modification recommended.  Marland Kitchen DIABETES MELLITUS, TYPE II   . Edema   . ERECTILE DYSFUNCTION, ORGANIC   . GERD   . Gout, unspecified   . Heart murmur   . HYPERLIPIDEMIA   . HYPERTENSION   . 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 Pericarditis    recurrent  . POLYNEUROPATHY   . PULMONARY SARCOIDOSIS    Mediastinal lymphadenopathy with biospy proven sarcodosis  . Seizures ([place])    "none in 4-5 years; don't know what kind; not related to alcohol" (04/13/2016)  . TESTICULAR HYPOFUNCTION   . TIA (transient ischemic attack)    "I don't remember when" (04/13/2016)   Past Surgical History:  Procedure Laterality Date  . BRONCHOSCOPY  08/21/08  . CARDIAC CATHETERIZATION  06/25/2009   minimal disease  . CARDIAC CATHETERIZATION N/A 04/14/2016   Procedure: Left Heart Cath and Coronary Angiography;  Surgeon: Burnell Blanks, MD;  Location: [place] [facility];  Service: Cardiovascular;  Laterality: N/A;  . LEFT HEART CATHETERIZATION WITH CORONARY ANGIOGRAM N/A 05/18/2014   Procedure: LEFT HEART CATHETERIZATION WITH CORONARY ANGIOGRAM;  Surgeon: Jettie Booze, MD;  Location[facility] C[facility];  Service: Cardiovascular;  Laterality: N/A;  . MEDIASTINOSCOPY  11/30/08   Family History  Problem Relation Age of Onset  . Diabetes Mother   . Hypertension Mother   . Diabetes Father   . Heart disease Father 34       Fatal MI  . Heart failure Father    Social History   Occupational History  . Not on file.   Social History Main Topics  . Smoking status: Former Smoker    Packs/day: 1.00    Years: 20.00    Types: Cigarettes    Quit date: 05/18/2014  . Smokeless tobacco: Never Used  . Alcohol use 0.0 oz/week     Comment:  04/13/2016 "glass of wine a couple times/month"  . Drug use: No  . Sexual activity: Yes   Tobacco Counseling Counseling given: Not Answered   Activities of Daily Living In your present state of health, do you have any difficulty performing the following activities: 01/18/2017 04/13/2016  Hearing? N -  Vision? N -  Difficulty concentrating or making decisions? N -  Walking or climbing stairs? N -  Dressing or bathing? N -  Doing errands, shopping? N N  Preparing Food and eating ? N -  Using the Toilet? N -  In the past six months, have you accidently leaked urine? N -  Do you have problems with loss of bowel control? N -  Managing your Medications? N -  Managing your Finances? N -  Housekeeping or managing your  Housekeeping? N -  Some recent data might be hidden    Immunizations and Health Maintenance Immunization History  Administered Date(s) Administered  . Influenza Split 04/06/2011, 02/17/2012, 03/16/2012  . Influenza,inj,Quad PF,36+ Mos 03/21/2013, 03/08/2014, 04/03/2015, 02/19/2016  . Pneumococcal Conjugate-13 04/29/2015  . Pneumococcal Polysaccharide-23 11/06/2015  . Tdap 03/21/2013   Health Maintenance Due  Topic Date Due  . FOOT EXAM  04/28/2016  . INFLUENZA VACCINE  01/06/2017    Patient Care Team: Janith Lima, MD as PCP - General (Internal Medicine) Kittie Plater as Consulting Physician (Rheumatology) Mosetta Anis, MD as Referring Physician (Rheumatology) Chesley Mires, MD as Consulting Physician (Pulmonary Disease) Renato Shin, MD as Consulting Physician (Endocrinology) Dorothy Spark, MD (Cardiology)  Indicate any recent Medical Services you may have received from other than [facility] providers in the past year (date may be approximate).    Assessment:   This is a routine wellness examination for [place]. Physical assessment deferred to PCP.   Hearing/Vision screen Hearing Screening Comments: Able to hear conversational tones w/o difficulty.  No issues reported.  Passed whisper test  Dietary issues and exercise activities discussed: Current Exercise Habits: Home exercise routine, Type of exercise: calisthenics;strength training/weights, Time (Minutes): 50, Frequency (Times/Week): 2, Weekly Exercise (Minutes/Week): 100, Intensity: Moderate, Exercise limited by: None identified  Diet (meal preparation, eat out, water intake, caffeinated beverages, dairy products, fruits and vegetables): in general, a "healthy" diet  , well balanced , eats a variety of fruits and vegetables daily, limits salt, fat/cholesterol, sugar, caffeine, drinks 1-2 glasses of water daily.  Reviewed heart healthy and diabetic diet, encouraged patient to increase daily water intake.     Goals    . lose weight          Increase exercise and activity to daily either going to gym or house-hold tasks, eat smaller portions and make healthier choices.      Depression Screen PHQ 2/9 Scores 01/18/2017  PHQ - 2 Score 1  PHQ- 9 Score 2    Fall Risk Fall Risk  01/18/2017  Falls in the past year? No    Cognitive Function:       Ad8 score reviewed for issues:  Issues making decisions: no  Less interest in hobbies / activities: no  Repeats questions, stories (family complaining): no  Trouble using ordinary gadgets (microwave, computer, phone):no  Forgets the month or year: no  Mismanaging finances: no  Remembering appts: no  Daily problems with thinking and/or memory: no Ad8 score is= 0  Screening Tests Health Maintenance  Topic Date Due  . FOOT EXAM  04/28/2016  . INFLUENZA VACCINE  01/06/2017  . HEMOGLOBIN A1C  06/10/2017  . OPHTHALMOLOGY EXAM  09/08/2017  . PNEUMOCOCCAL POLYSACCHARIDE VACCINE (2) 11/05/2020  . TETANUS/TDAP  03/22/2023  . HIV Screening  Completed        Plan:  Continue doing brain stimulating activities (puzzles, reading, adult coloring books, staying active) to keep memory sharp.   Continue to eat heart healthy diet  (full of fruits, vegetables, whole grains, lean protein, water--limit salt, fat, and sugar intake) and increase physical activity as tolerated.  I have personally reviewed and noted the following in the patient's chart:   . Medical and social history . Use of alcohol, tobacco or illicit drugs  . Current medications and supplements . Functional ability and status . Nutritional status . Physical activity . Advanced directives . List of other physicians . Vitals . Screenings to include cognitive, depression, and falls . Referrals and  appointments  In addition, I have reviewed and discussed with patient certain preventive protocols, quality metrics, and best practice recommendations. A written personalized care plan for preventive services as well as general preventive health recommendations were provided to patient.     Michiel Cowboy, RN   01/18/2017

## 2017-01-15 NOTE — Progress Notes (Signed)
Pre visit review using our clinic review tool, if applicable. No additional management support is needed unless otherwise documented below in the visit note. 

## 2017-01-18 ENCOUNTER — Other Ambulatory Visit: Payer: Self-pay | Admitting: Internal Medicine

## 2017-01-18 ENCOUNTER — Ambulatory Visit (INDEPENDENT_AMBULATORY_CARE_PROVIDER_SITE_OTHER): Payer: Medicare Other | Admitting: Internal Medicine

## 2017-01-18 ENCOUNTER — Encounter: Payer: Self-pay | Admitting: Internal Medicine

## 2017-01-18 VITALS — BP 168/102 | HR 76 | Temp 98.1°F | Resp 16 | Ht 75.0 in | Wt 294.0 lb

## 2017-01-18 DIAGNOSIS — Z0000 Encounter for general adult medical examination without abnormal findings: Secondary | ICD-10-CM

## 2017-01-18 DIAGNOSIS — I10 Essential (primary) hypertension: Secondary | ICD-10-CM | POA: Diagnosis not present

## 2017-01-18 MED ORDER — IRBESARTAN-HYDROCHLOROTHIAZIDE 300-12.5 MG PO TABS: 1.0000 | ORAL_TABLET | Freq: Every day | ORAL | 1 refills | Status: DC

## 2017-01-18 NOTE — Patient Instructions (Addendum)
Continue doing brain stimulating activities (puzzles, reading, adult coloring books, staying active) to keep memory sharp.   Continue to eat heart healthy diet (full of fruits, vegetables, whole grains, lean protein, water--limit salt, fat, and sugar intake) and increase physical activity as tolerated.   [name] , Thank you for taking time to come for your Medicare Wellness Visit. I appreciate your ongoing commitment to your health goals. Please review the following plan we discussed and let me know if I can assist you in the future.   These are the goals we discussed: Goals    . lose weight          Increase exercise and activity to daily either going to gym or house-hold tasks, eat smaller portions and make healthier choices.       This is a list of the screening recommended for you and due dates:  Health Maintenance  Topic Date Due  . Complete foot exam   04/28/2016  . Flu Shot  01/06/2017  . Hemoglobin A1C  06/10/2017  . Eye exam for diabetics  09/08/2017  . Pneumococcal vaccine (2) 11/05/2020  . Tetanus Vaccine  03/22/2023  . HIV Screening  Completed
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
## 2017-01-25 ENCOUNTER — Other Ambulatory Visit: Payer: Self-pay | Admitting: Internal Medicine

## 2017-01-25 DIAGNOSIS — J45990 Exercise induced bronchospasm: Secondary | ICD-10-CM

## 2017-02-16 ENCOUNTER — Ambulatory Visit: Payer: Self-pay | Admitting: Internal Medicine

## 2017-02-17 ENCOUNTER — Ambulatory Visit (INDEPENDENT_AMBULATORY_CARE_PROVIDER_SITE_OTHER): Payer: Medicare Other | Admitting: Internal Medicine

## 2017-02-17 ENCOUNTER — Telehealth: Payer: Self-pay | Admitting: Internal Medicine

## 2017-02-17 ENCOUNTER — Encounter: Payer: Self-pay | Admitting: Internal Medicine

## 2017-02-17 VITALS — BP 122/86 | HR 98 | Temp 98.5°F | Ht 75.0 in | Wt 288.0 lb

## 2017-02-17 DIAGNOSIS — Z23 Encounter for immunization: Secondary | ICD-10-CM | POA: Diagnosis not present

## 2017-02-17 DIAGNOSIS — J019 Acute sinusitis, unspecified: Secondary | ICD-10-CM

## 2017-02-17 MED ORDER — LEVOFLOXACIN 500 MG PO TABS: 500.0000 mg | ORAL_TABLET | Freq: Every day | ORAL | 0 refills | Status: AC

## 2017-02-17 NOTE — Progress Notes (Signed)
Subjective:    Patient ID: [name], male    DOB: 12-Jul-1970, 46 y.o.   MRN: [id number]  HPI   Here with 2-3 days acute onset fever, facial pain, pressure, headache, general weakness and malaise, and greenish d/c, with mild ST and cough, but pt denies chest pain, wheezing, increased sob or doe, orthopnea, PND, increased LE swelling, palpitations, dizziness or syncope. Past Medical History:  Diagnosis Date  . Anginal pain ([place])   . Arthritis    ? 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 Followed by Dr. Trudie Reed  . Arthritis    "ankles" (04/13/2016)  . Asthma   . CORONARY ARTERY DISEASE    a. cath 05/2014: mild LAD disease, normal LCx and RCA. Risk factor modification recommended.  Marland Kitchen DIABETES MELLITUS, TYPE II   . Edema   . ERECTILE DYSFUNCTION, ORGANIC   . GERD   . Gout, unspecified   . Heart murmur   . HYPERLIPIDEMIA   . HYPERTENSION   . 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 Pericarditis    recurrent  . POLYNEUROPATHY   . PULMONARY SARCOIDOSIS    Mediastinal lymphadenopathy with biospy proven sarcodosis  . Seizures ([place])    "none in 4-5 years; don't know what kind; not related to alcohol" (04/13/2016)  . TESTICULAR HYPOFUNCTION   . TIA (transient ischemic attack)    "I don't remember when" (04/13/2016)   Past Surgical History:  Procedure Laterality Date  . BRONCHOSCOPY  08/21/08  . CARDIAC CATHETERIZATION  06/25/2009   minimal disease  . CARDIAC CATHETERIZATION N/A 04/14/2016   Procedure: Left Heart Cath and Coronary Angiography;  Surgeon: Burnell Blanks, MD;  Location: [facility];  Service: Cardiovascular;  Laterality: N/A;  . LEFT HEART CATHETERIZATION WITH CORONARY ANGIOGRAM N/A 05/18/2014   Procedure: LEFT HEART CATHETERIZATION WITH CORONARY ANGIOGRAM;  Surgeon: Jettie Booze, MD;  Location: [facility] CATH [facility];  Service: Cardiovascular;  Laterality: N/A;  . MEDIASTINOSCOPY  11/30/08    reports that he quit  smoking about 2 years ago. His smoking use included Cigarettes. He has a 20.00 pack-year smoking history. He has never used smokeless tobacco. He reports that he drinks alcohol. He reports that he does not use drugs. family history includes Diabetes in his father and mother; Heart disease (age of onset: 59) in his father; Heart failure in his father; Hypertension in his mother. Allergies  Allergen Reactions  . Amlodipine Other (See Comments)    Ankle swelling with 66m, ok with 586mdose  . Sulfonamide Derivatives Rash and Other (See Comments)   Current Outpatient Prescriptions on File Prior to Visit  Medication Sig Dispense Refill  . allopurinol (ZYLOPRIM) 300 MG tablet TAKE 1 TABLET(300 MG) BY MOUTH DAILY 90 tablet 0  . aspirin EC 81 MG tablet Take 1 tablet (81 mg total) by mouth daily. 30 tablet 3  . atorvastatin (LIPITOR) 40 MG tablet Take 1 tablet (40 mg total) by mouth daily at 6 PM. 30 tablet 12  . Blood Glucose Monitoring Suppl (ONETOUCH VERIO FLEX SYSTEM) w/Device KIT 1 Act by Does not apply route 3 (three) times daily with meals as needed. 2 kit 0  . cholecalciferol (VITAMIN D) 1000 units tablet Take 1,000 Units by mouth daily.    . colchicine 0.6 MG tablet Take 1 tablet (0.6 mg total) by mouth daily as needed. 30 tablet 0  . folic acid (FOLVITE) 1 MG tablet TK 1 T PO QD  1  .  glucose blood (ONETOUCH VERIO) test strip Use TID 100 each 11  . ibuprofen (ADVIL,MOTRIN) 600 MG tablet Take 1 tablet (600 mg total) by mouth every 6 (six) hours as needed. 30 tablet 0  . irbesartan-hydrochlorothiazide (AVALIDE) 300-12.5 MG tablet Take 1 tablet by mouth daily. 90 tablet 1  . metFORMIN (GLUCOPHAGE-XR) 500 MG 24 hr tablet TAKE 1 TABLET(500 MG) BY MOUTH TWICE DAILY WITH A MEAL 180 tablet 1  . methotrexate (RHEUMATREX) 2.5 MG tablet TK 4 TS PO 1 TIME A WK  1  . nitroGLYCERIN (NITROSTAT) 0.4 MG SL tablet Place 1 tablet (0.4 mg total) under the tongue every 5 (five) minutes as needed for chest pain. 90  tablet 3  . potassium chloride SA (K-DUR,KLOR-CON) 20 MEQ tablet TAKE 1 TABLET(20 MEQ) BY MOUTH TWICE DAILY 180 tablet 0  . sitaGLIPtin ([name]) 100 MG tablet Take 1 tablet (100 mg total) by mouth daily. 30 tablet 11  . tadalafil (CIALIS) 20 MG tablet Take 0.5-1 tablets (10-20 mg total) by mouth every 3 (three) days as needed for erectile dysfunction. 10 tablet 11  . VENTOLIN HFA 108 (90 Base) MCG/ACT inhaler INHALE 2 PUFFS INTO THE LUNGS EVERY 6 HOURS AS NEEDED FOR WHEEZING OR SHORTNESS OF BREATH 18 g 5  . torsemide (DEMADEX) 20 MG tablet Take 1 tablet (20 mg total) by mouth daily. 30 tablet 11   Current Facility-Administered Medications on File Prior to Visit  Medication Dose Route Frequency Provider Last Rate Last Dose  . testosterone cypionate (DEPOTESTOSTERONE CYPIONATE) injection 200 mg  200 mg Intramuscular Q14 Days Janith Lima, MD   200 mg at 05/07/16 1143  . testosterone cypionate (DEPOTESTOSTERONE CYPIONATE) injection 200 mg  200 mg Intramuscular Q28 days Janith Lima, MD   200 mg at 05/21/16 0857  . testosterone cypionate (DEPOTESTOSTERONE CYPIONATE) injection 200 mg  200 mg Intramuscular Q28 days Janith Lima, MD   200 mg at 06/04/16 1508   Review of Systems All otherwise neg per pt    Objective:   Physical Exam BP 122/86   Pulse 98   Temp 98.5 F (36.9 C) (Oral)   Ht _0  (1.905 m)   Wt 288 lb (130.6 kg)   SpO2 100%   BMI 36.00 kg/m  VS noted, mild ill Constitutional: Pt appears in NAD HENT: Head: NCAT.  Right Ear: External ear normal.  Left Ear: External ear normal.  Eyes: . Pupils are equal, round, and reactive to light. Conjunctivae and EOM are normal Nose: without d/c or deformity Bilat tm's with mild erythema.  Max sinus areas mild tender.  Pharynx with mild erythema, no exudate, also with mild enlarged left preauricular LA tender Neck: Neck supple. Gross normal ROM Cardiovascular: Normal rate and regular rhythm.   Pulmonary/Chest: Effort normal and  breath sounds without rales or wheezing.  Neurological: Pt is alert. At baseline orientation, motor grossly intact Skin: Skin is warm. No rashes, other new lesions, no LE edema Psychiatric: Pt behavior is normal without agitation  No other exam findings      Assessment & Plan:

## 2017-02-17 NOTE — Telephone Encounter (Signed)
Patient Name: [name]  DOB: 01/25/1971    Initial Comment Caller states he has neck stiffness, fever, and headache. Current temperature: 99.0. He is asking to schedule an appt.    Nurse Assessment  Nurse: Stefano Gaul, RN, Dwana Curd Date/Time (Eastern Time): 02/17/2017 7:07:56 AM  Confirm and document reason for call. If symptomatic, describe symptoms. ---Caller states he has intermittent fever. current temp is 99. He has neck pain and headache. He can bend his neck. neck pain 10. symptoms started on Sunday.  Does the patient have any new or worsening symptoms? ---Yes  Will a triage be completed? ---Yes  Related visit to physician within the last 2 weeks? ---No  Does the PT have any chronic conditions? (i.e. diabetes, asthma, etc.) ---Yes  List chronic conditions. ---diabetes; HTN  Is this a behavioral health or substance abuse call? ---No     Guidelines    Guideline Title Affirmed Question Affirmed Notes  Neck Pain or Stiffness [1] SEVERE neck pain (e.g., excruciating, unable to do any normal activities) AND [2] not improved after 2 hours of pain medicine    Final Disposition User   See Physician within 4 Hours (or PCP triage) Stefano Gaul, RN, [place]    Comments  appt scheduled for 02/17/2017 at 9:15 am with Dr. Oliver Barre   Referrals  REFERRED TO PCP OFFICE   Disagree/Comply: Comply

## 2017-02-17 NOTE — Patient Instructions (Signed)
You had the flu shot today  Please take all new medication as prescribed - the antibiotic  Please continue all other medications as before, and refills have been done if requested.  Please have the pharmacy call with any other refills you may need.  Please keep your appointments with your specialists as you may have planned     

## 2017-02-20 DIAGNOSIS — J019 Acute sinusitis, unspecified: Secondary | ICD-10-CM | POA: Insufficient documentation

## 2017-02-24 ENCOUNTER — Other Ambulatory Visit: Payer: Self-pay | Admitting: Internal Medicine

## 2017-03-01 ENCOUNTER — Encounter: Payer: Self-pay | Admitting: Internal Medicine

## 2017-03-01 ENCOUNTER — Ambulatory Visit (INDEPENDENT_AMBULATORY_CARE_PROVIDER_SITE_OTHER): Payer: Medicare Other | Admitting: Internal Medicine

## 2017-03-01 ENCOUNTER — Other Ambulatory Visit (INDEPENDENT_AMBULATORY_CARE_PROVIDER_SITE_OTHER): Payer: Self-pay

## 2017-03-01 VITALS — BP 118/82 | HR 99 | Temp 97.5°F | Resp 16 | Ht 75.0 in | Wt 282.0 lb

## 2017-03-01 DIAGNOSIS — Z6835 Body mass index (BMI) 35.0-35.9, adult: Secondary | ICD-10-CM

## 2017-03-01 DIAGNOSIS — E669 Obesity, unspecified: Secondary | ICD-10-CM | POA: Diagnosis not present

## 2017-03-01 DIAGNOSIS — E785 Hyperlipidemia, unspecified: Secondary | ICD-10-CM

## 2017-03-01 DIAGNOSIS — I10 Essential (primary) hypertension: Secondary | ICD-10-CM

## 2017-03-01 DIAGNOSIS — M109 Gout, unspecified: Secondary | ICD-10-CM | POA: Diagnosis not present

## 2017-03-01 DIAGNOSIS — E1169 Type 2 diabetes mellitus with other specified complication: Secondary | ICD-10-CM | POA: Diagnosis not present

## 2017-03-01 DIAGNOSIS — D869 Sarcoidosis, unspecified: Secondary | ICD-10-CM

## 2017-03-01 DIAGNOSIS — E119 Type 2 diabetes mellitus without complications: Secondary | ICD-10-CM | POA: Diagnosis not present

## 2017-03-01 DIAGNOSIS — I2510 Atherosclerotic heart disease of native coronary artery without angina pectoris: Secondary | ICD-10-CM

## 2017-03-01 DIAGNOSIS — D860 Sarcoidosis of lung: Secondary | ICD-10-CM | POA: Diagnosis not present

## 2017-03-01 DIAGNOSIS — I158 Other secondary hypertension: Secondary | ICD-10-CM | POA: Diagnosis not present

## 2017-03-01 LAB — LIPID PANEL
Cholesterol: 192 mg/dL (ref 0–200)
HDL: 35.1 mg/dL — ABNORMAL LOW (ref >39.00)
LDL Cholesterol: 129 mg/dL — ABNORMAL HIGH (ref 0–99)
NonHDL: 156.59
Total CHOL/HDL Ratio: 5
Triglycerides: 139 mg/dL (ref 0.0–149.0)
VLDL: 27.8 mg/dL (ref 0.0–40.0)

## 2017-03-01 LAB — HEMOGLOBIN A1C: Hgb A1c MFr Bld: 7.3 % — ABNORMAL HIGH (ref 4.6–6.5)

## 2017-03-01 LAB — COMPREHENSIVE METABOLIC PANEL
ALT: 49 U/L (ref 0–53)
AST: 35 U/L (ref 0–37)
Albumin: 4.2 g/dL (ref 3.5–5.2)
Alkaline Phosphatase: 90 U/L (ref 39–117)
BUN: 10 mg/dL (ref 6–23)
CO2: 30 mEq/L (ref 19–32)
Calcium: 9.6 mg/dL (ref 8.4–10.5)
Chloride: 98 mEq/L (ref 96–112)
Creatinine, Ser: 0.78 mg/dL (ref 0.40–1.50)
GFR: 138.91 mL/min (ref >60.00)
Glucose, Bld: 196 mg/dL — ABNORMAL HIGH (ref 70–99)
Potassium: 3.9 mEq/L (ref 3.5–5.1)
Sodium: 137 mEq/L (ref 135–145)
Total Bilirubin: 0.4 mg/dL (ref 0.2–1.2)
Total Protein: 7.9 g/dL (ref 6.0–8.3)

## 2017-03-01 LAB — THYROID PANEL WITH TSH
Free Thyroxine Index: 2 (ref 1.4–3.8)
T3 Uptake: 34 % (ref 22–35)
T4, Total: 5.9 ug/dL (ref 4.9–10.5)
TSH: 0.66 mIU/L (ref 0.40–4.50)

## 2017-03-01 MED ORDER — ROSUVASTATIN CALCIUM 40 MG PO TABS: 40.0000 mg | ORAL_TABLET | Freq: Every day | ORAL | 1 refills | Status: DC

## 2017-03-01 NOTE — Patient Instructions (Signed)
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
## 2017-03-10 ENCOUNTER — Ambulatory Visit (INDEPENDENT_AMBULATORY_CARE_PROVIDER_SITE_OTHER): Payer: Medicare Other | Admitting: Endocrinology

## 2017-03-10 ENCOUNTER — Encounter: Payer: Self-pay | Admitting: Endocrinology

## 2017-03-10 VITALS — BP 122/86 | HR 92 | Wt 289.4 lb

## 2017-03-10 DIAGNOSIS — E1169 Type 2 diabetes mellitus with other specified complication: Secondary | ICD-10-CM

## 2017-03-10 DIAGNOSIS — E669 Obesity, unspecified: Secondary | ICD-10-CM

## 2017-03-10 MED ORDER — SITAGLIPTIN PHOSPHATE 100 MG PO TABS: 100.0000 mg | ORAL_TABLET | Freq: Every day | ORAL | 11 refills | Status: DC

## 2017-03-10 MED ORDER — PIOGLITAZONE HCL 15 MG PO TABS: 15.0000 mg | ORAL_TABLET | Freq: Every day | ORAL | 11 refills | Status: DC

## 2017-03-10 NOTE — Progress Notes (Signed)
Subjective:    Patient ID: [name], male    DOB: 13-Nov-1970, 46 y.o.   MRN: [id number]  HPI Pt returns for f/u of diabetes mellitus:  DM type: 2 Dx'ed: 5284 Complications: CAD Therapy: 2 oral meds DKA: never Severe hypoglycemia: never Pancreatitis: never Pancreatic imaging: 2011 CT: mild fatty replacement in the head and uncinate process.  Interval history: he takes meds as rx'ed.  pt states he feels well in general. Past Medical History:  Diagnosis Date  . Anginal pain ([place])   . Arthritis    ? 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 Followed by Dr. Trudie Reed  . Arthritis    "ankles" (04/13/2016)  . Asthma   . CORONARY ARTERY DISEASE    a. cath 05/2014: mild LAD disease, normal LCx and RCA. Risk factor modification recommended.  Marland Kitchen DIABETES MELLITUS, TYPE II   . Edema   . ERECTILE DYSFUNCTION, ORGANIC   . GERD   . Gout, unspecified   . Heart murmur   . HYPERLIPIDEMIA   . HYPERTENSION   . 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 Pericarditis    recurrent  . POLYNEUROPATHY   . PULMONARY SARCOIDOSIS    Mediastinal lymphadenopathy with biospy proven sarcodosis  . Seizures ([place])    "none in 4-5 years; don't know what kind; not related to alcohol" (04/13/2016)  . TESTICULAR HYPOFUNCTION   . TIA (transient ischemic attack)    "I don't remember when" (04/13/2016)    Past Surgical History:  Procedure Laterality Date  . BRONCHOSCOPY  08/21/08  . CARDIAC CATHETERIZATION  06/25/2009   minimal disease  . CARDIAC CATHETERIZATION N/A 04/14/2016   Procedure: Left Heart Cath and Coronary Angiography;  Surgeon: Burnell Blanks, MD;  Location: [facility];  Service: Cardiovascular;  Laterality: N/A;  . LEFT HEART CATHETERIZATION WITH CORONARY ANGIOGRAM N/A 05/18/2014   Procedure: LEFT HEART CATHETERIZATION WITH CORONARY ANGIOGRAM;  Surgeon: Jettie Booze, MD;  Location: [facility];  Service: Cardiovascular;  Laterality:  N/A;  . MEDIASTINOSCOPY  11/30/08    Social History   Social History  . Marital status: Significant Other    Spouse name: N/A  . Number of children: N/A  . Years of education: N/A   Occupational History  . Not on file.   Social History Main Topics  . Smoking status: Former Smoker    Packs/day: 1.00    Years: 20.00    Types: Cigarettes    Quit date: 05/18/2014  . Smokeless tobacco: Never Used  . Alcohol use 0.0 oz/week     Comment: 04/13/2016 "glass of wine a couple times/month"  . Drug use: No  . Sexual activity: Yes   Other Topics Concern  . Not on file   Social History Narrative   Married, lives with wife and 3 kids.    Currently student. 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 allopurinol (ZYLOPRIM) 300 MG tablet TAKE 1 TABLET(300 MG) BY MOUTH DAILY 90 tablet 0  . aspirin EC 81 MG tablet Take 1 tablet (81 mg total) by mouth daily. 30 tablet 3  . Blood Glucose Monitoring Suppl (ONETOUCH VERIO FLEX SYSTEM) w/Device KIT 1 Act by Does not apply route 3 (three) times daily with meals as needed. 2 kit 0  . cholecalciferol (VITAMIN D) 1000 units tablet Take 1,000 Units by mouth daily.    Marland Kitchen  folic acid (FOLVITE) 1 MG tablet TK 1 T PO QD  1  . glucose blood (ONETOUCH VERIO) test strip Use TID 100 each 11  . ibuprofen (ADVIL,MOTRIN) 600 MG tablet Take 1 tablet (600 mg total) by mouth every 6 (six) hours as needed. 30 tablet 0  . irbesartan-hydrochlorothiazide (AVALIDE) 300-12.5 MG tablet Take 1 tablet by mouth daily. 90 tablet 1  . metFORMIN (GLUCOPHAGE-XR) 500 MG 24 hr tablet TAKE 1 TABLET(500 MG) BY MOUTH TWICE DAILY WITH A MEAL 180 tablet 1  . methotrexate (RHEUMATREX) 2.5 MG tablet TK 4 TS PO 1 TIME A WK  1  . nitroGLYCERIN (NITROSTAT) 0.4 MG SL tablet Place 1 tablet (0.4 mg total) under the tongue every 5 (five) minutes as needed for chest pain. 90 tablet 3  . potassium  chloride SA (K-DUR,KLOR-CON) 20 MEQ tablet TAKE 1 TABLET(20 MEQ) BY MOUTH TWICE DAILY 180 tablet 0  . rosuvastatin (CRESTOR) 40 MG tablet Take 1 tablet (40 mg total) by mouth daily. 90 tablet 1  . tadalafil (CIALIS) 20 MG tablet Take 0.5-1 tablets (10-20 mg total) by mouth every 3 (three) days as needed for erectile dysfunction. 10 tablet 11  . torsemide (DEMADEX) 20 MG tablet Take 1 tablet (20 mg total) by mouth daily. 30 tablet 11  . VENTOLIN HFA 108 (90 Base) MCG/ACT inhaler INHALE 2 PUFFS INTO THE LUNGS EVERY 6 HOURS AS NEEDED FOR WHEEZING OR SHORTNESS OF BREATH 18 g 5   No current facility-administered medications on file prior to visit.     Allergies  Allergen Reactions  . Amlodipine Other (See Comments)    Ankle swelling with '10mg'$ , ok with '5mg'$  dose  . Sulfonamide Derivatives Rash and Other (See Comments)    Family History  Problem Relation Age of Onset  . Diabetes Mother   . Hypertension Mother   . Diabetes Father   . Heart disease Father 44       Fatal MI  . Heart failure Father     BP 122/86   Pulse 92   Wt 289 lb 6.4 oz (131.3 kg)   SpO2 98%   BMI 36.17 kg/m   Review of Systems He denies nausea    Objective:   Physical Exam VITAL SIGNS:  See vs page GENERAL: no distress Pulses: foot pulses are intact bilaterally.   MSK: no deformity of the feet or ankles.  CV: no edema of the legs or ankles.  Skin:  no ulcer on the feet or ankles.  normal color and temp on the feet and ankles Neuro: sensation is intact to touch on the feet and ankles.     Lab Results  Component Value Date   HGBA1C 7.3 (H) 03/01/2017    Lab Results  Component Value Date   ALT 49 03/01/2017   AST 35 03/01/2017   ALKPHOS 90 03/01/2017   BILITOT 0.4 03/01/2017       Assessment & Plan:  Type 2 DM, with CAD: he needs increased rx, if it can be done with a regimen that avoids or minimizes hypoglycemia.    Patient Instructions  I have sent a prescription to your pharmacy, to add  "pioglitizone."  check your blood sugar once a day.  vary the time of day when you check, between before the 3 meals, and at bedtime.  also check if you have symptoms of your blood sugar being too high or too low.  please keep a record of the readings and bring it to your next  appointment here (or you can bring the meter itself).  You can write it on any piece of paper.  please call us sooner if your blood sugar goes below 70, or if you have a lot of readings over 200. Please come back for a follow-up appointment in 4 months.

## 2017-03-10 NOTE — Patient Instructions (Signed)
I have sent a prescription to your pharmacy, to add "pioglitizone."  check your blood sugar once a day.  vary the time of day when you check, between before the 3 meals, and at bedtime.  also check if you have symptoms of your blood sugar being too high or too low.  please keep a record of the readings and bring it to your next appointment here (or you can bring the meter itself).  You can write it on any piece of paper.  please call us sooner if your blood sugar goes below 70, or if you have a lot of readings over 200. Please come back for a follow-up appointment in 4 months.

## 2017-03-23 DIAGNOSIS — R404 Transient alteration of awareness: Secondary | ICD-10-CM | POA: Insufficient documentation

## 2017-03-23 DIAGNOSIS — G40209 Localization-related (focal) (partial) symptomatic epilepsy and epileptic syndromes with complex partial seizures, not intractable, without status epilepticus: Secondary | ICD-10-CM | POA: Insufficient documentation

## 2017-04-18 ENCOUNTER — Other Ambulatory Visit: Payer: Self-pay | Admitting: Internal Medicine

## 2017-05-10 ENCOUNTER — Other Ambulatory Visit: Payer: Self-pay | Admitting: Cardiology

## 2017-05-10 ENCOUNTER — Other Ambulatory Visit: Payer: Self-pay

## 2017-05-10 MED ORDER — TORSEMIDE 20 MG PO TABS: 20.0000 mg | ORAL_TABLET | Freq: Every day | ORAL | 0 refills | Status: DC

## 2017-05-11 ENCOUNTER — Telehealth: Payer: Self-pay | Admitting: Internal Medicine

## 2017-05-11 NOTE — Telephone Encounter (Signed)
Copied from CRM [id number]. Topic: Quick Communication - See Telephone Encounter >> May 11, 2017  3:04 PM Guinevere Ferrari, NT wrote: CRM for notification. See Telephone encounter for: Pt is calling about trying to get a medication refill on torsemide (DEMADEX) 20 MG tablet. Pt said that the pharmacy has also faxed the medication over last week with no response. Pt. Would like a call back.  05/11/17.

## 2017-05-12 NOTE — Telephone Encounter (Signed)
Spoke with patient regarding refill on Demadex. A refill was sent on 12/3 to his pharmacy. Reminded patient he needed appointment with Dr. Delton See before further refills.  Stated he understood.

## 2017-06-06 ENCOUNTER — Other Ambulatory Visit: Payer: Self-pay | Admitting: Cardiology

## 2017-06-06 ENCOUNTER — Other Ambulatory Visit: Payer: Self-pay | Admitting: Internal Medicine

## 2017-06-07 ENCOUNTER — Other Ambulatory Visit: Payer: Self-pay | Admitting: Cardiology

## 2017-06-07 DIAGNOSIS — M109 Gout, unspecified: Secondary | ICD-10-CM | POA: Diagnosis not present

## 2017-06-07 DIAGNOSIS — D860 Sarcoidosis of lung: Secondary | ICD-10-CM | POA: Diagnosis not present

## 2017-06-07 DIAGNOSIS — I158 Other secondary hypertension: Secondary | ICD-10-CM | POA: Diagnosis not present

## 2017-06-07 DIAGNOSIS — E119 Type 2 diabetes mellitus without complications: Secondary | ICD-10-CM | POA: Diagnosis not present

## 2017-06-07 NOTE — Telephone Encounter (Signed)
°*  STAT* If patient is at the pharmacy, call can be transferred to refill team.   1. Which medications need to be refilled? (please list name of each medication and dose if known) torsemide (DEMADEX) 20 MG tablet  2. Which pharmacy/location (including street and city if local pharmacy) is medication to be sent to? [facility] on [address]  3. Do they need a 30 day or 90 day supply?  90   Pt has an appt 06/28/2017 @ 9:00am with Boyce Medici

## 2017-06-22 DIAGNOSIS — G4710 Hypersomnia, unspecified: Secondary | ICD-10-CM | POA: Insufficient documentation

## 2017-06-22 DIAGNOSIS — R404 Transient alteration of awareness: Secondary | ICD-10-CM | POA: Diagnosis not present

## 2017-06-22 DIAGNOSIS — D869 Sarcoidosis, unspecified: Secondary | ICD-10-CM | POA: Diagnosis not present

## 2017-06-22 DIAGNOSIS — G47419 Narcolepsy without cataplexy: Secondary | ICD-10-CM | POA: Insufficient documentation

## 2017-06-22 DIAGNOSIS — R0683 Snoring: Secondary | ICD-10-CM | POA: Insufficient documentation

## 2017-06-22 DIAGNOSIS — G40209 Localization-related (focal) (partial) symptomatic epilepsy and epileptic syndromes with complex partial seizures, not intractable, without status epilepticus: Secondary | ICD-10-CM | POA: Diagnosis not present

## 2017-06-22 DIAGNOSIS — G4761 Periodic limb movement disorder: Secondary | ICD-10-CM | POA: Diagnosis not present

## 2017-06-28 ENCOUNTER — Ambulatory Visit: Payer: Medicare Other | Admitting: Cardiology

## 2017-06-28 ENCOUNTER — Encounter: Payer: Self-pay | Admitting: Cardiology

## 2017-06-28 VITALS — BP 138/98 | HR 82 | Ht 75.0 in | Wt 292.0 lb

## 2017-06-28 DIAGNOSIS — I2510 Atherosclerotic heart disease of native coronary artery without angina pectoris: Secondary | ICD-10-CM

## 2017-06-28 DIAGNOSIS — I10 Essential (primary) hypertension: Secondary | ICD-10-CM | POA: Diagnosis not present

## 2017-06-28 MED ORDER — TORSEMIDE 20 MG PO TABS: 20.0000 mg | ORAL_TABLET | Freq: Every day | ORAL | 11 refills | Status: DC

## 2017-06-28 MED ORDER — NITROGLYCERIN 0.4 MG SL SUBL: 0.4000 mg | SUBLINGUAL_TABLET | SUBLINGUAL | 3 refills | Status: DC | PRN

## 2017-06-28 NOTE — Progress Notes (Signed)
06/28/2017 [name]   1971/05/13  [id number]  Primary Physician Janith Lima, MD Primary Cardiologist: Dr. Meda Coffee   Reason for Visit/CC: F/u for Nonobstructive CAD and HTN  HPI:  [name] is a 47 y.o. male with a past medical history of CAD (nonobstructive disease by cath in 05/2014), HTN, HLD, Type 2 DM, RA, and pulmonary sarcoidosis and family h/o CAD (father with MI at age 9). Pt underwent cardiac cath in 2015 for evaluation for CP, which showed mild atherosclerosis along the LAD with widely patent LCx and RCA. EF was estimated at 60%. Risk factor modification was recommended. His is on meds for BP, DM and cholesterol. His PCP follows his lipid profile. Last lipid panel showed elevated LDL at 129 mg/dL. His PCP, Dr. Ronnald Ramp, started him on Crestor at that time and he has tolerated well w/o side effects. He returns back to PCP tomorrow for repeat assessment and f/u labs.   From a cardiac standpoint, he has done well. He denies CP and dyspnea. No other cardiac symptoms. BP is mildly elevated at 138/98, however he has not yet taken his morning meds. He reports full daily compliance. EKG shows NSR and is unchanged from previous.   Past Cardiac Studies  Cardiac Catheterization: 05/2014 HEMODYNAMICS:Aortic pressure was 105/70; LV pressure was 105/2; LVEDP 16. There was no gradient between the left ventricle and aorta.   ANGIOGRAPHIC DATA:The left main coronary artery is widely patent.  The left anterior descending artery is a large vessel which wraps around the apex. There is mild atherosclerosis in the mid vessel. There are 2 large diagonals more proximally. There are widely patent. The third diagonal is medium-sized and patent.  The left circumflex artery is a large vessel. The first obtuse marginal is large and branches across the lateral wall. The second obtuse marginal is widely patent.  The right coronary artery is a large dominant vessel. There is an early  bifurcation of the posterior lateral and posterior descending arteries. Both vessels are widely patent.  LEFT VENTRICULOGRAM:Left ventricular angiogram was done in the 30 RAO projection and revealed normal left ventricular wall motion and systolic function with an estimated ejection fraction of 60 %. LVEDP was 16 mmHg.  IMPRESSIONS:  1. Normal left main coronary artery. 2. Mild disease in the left anterior descending artery and its branches. 3. Widely patent left circumflex artery and its branches. 4. Widely patent right coronary artery. 5. Normal left ventricular systolic function. LVEDP 16 mmHg. Ejection fraction 60%.  Current Meds  Medication Sig  . allopurinol (ZYLOPRIM) 300 MG tablet TAKE 1 TABLET(300 MG) BY MOUTH DAILY  . aspirin EC 81 MG tablet Take 1 tablet (81 mg total) by mouth daily.  . Blood Glucose Monitoring Suppl (ONETOUCH VERIO FLEX SYSTEM) w/Device KIT 1 Act by Does not apply route 3 (three) times daily with meals as needed.  . cholecalciferol (VITAMIN D) 1000 units tablet Take 1,000 Units by mouth daily.  . folic acid (FOLVITE) 1 MG tablet TK 1 T PO QD  . glucose blood (ONETOUCH VERIO) test strip Use TID  . ibuprofen (ADVIL,MOTRIN) 600 MG tablet Take 1 tablet (600 mg total) by mouth every 6 (six) hours as needed.  . irbesartan-hydrochlorothiazide (AVALIDE) 300-12.5 MG tablet Take 1 tablet by mouth daily.  . metFORMIN (GLUCOPHAGE-XR) 500 MG 24 hr tablet TAKE 1 TABLET(500 MG) BY MOUTH TWICE DAILY WITH A MEAL  . nitroGLYCERIN (NITROSTAT) 0.4 MG SL tablet Place 1 tablet (0.4 mg total) under the  tongue every 5 (five) minutes as needed for chest pain.  . pioglitazone (ACTOS) 15 MG tablet Take 1 tablet (15 mg total) by mouth daily.  . potassium chloride SA (K-DUR,KLOR-CON) 20 MEQ tablet TAKE 1 TABLET(20 MEQ) BY MOUTH TWICE DAILY  . rosuvastatin (CRESTOR) 40 MG tablet Take 1 tablet (40 mg total) by mouth daily.  . sitaGLIPtin ([name]) 100 MG tablet Take 1 tablet (100 mg  total) by mouth daily.  . VENTOLIN HFA 108 (90 Base) MCG/ACT inhaler INHALE 2 PUFFS INTO THE LUNGS EVERY 6 HOURS AS NEEDED FOR WHEEZING OR SHORTNESS OF BREATH  . [DISCONTINUED] nitroGLYCERIN (NITROSTAT) 0.4 MG SL tablet Place 1 tablet (0.4 mg total) under the tongue every 5 (five) minutes as needed for chest pain.   Allergies  Allergen Reactions  . Amlodipine Other (See Comments)    Ankle swelling with 23m, ok with 552mdose  . Sulfamethoxazole Rash    fever  . Sulfonamide Derivatives Rash and Other (See Comments)   Past Medical History:  Diagnosis Date  . Anginal pain (HCC[place]  . Arthritis    ? 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 Followed by Dr. HaTrudie Reed. Arthritis    "ankles" (04/13/2016)  . Asthma   . CORONARY ARTERY DISEASE    a. cath 05/2014: mild LAD disease, normal LCx and RCA. Risk factor modification recommended.  . Marland KitchenIABETES MELLITUS, TYPE II   . Edema   . ERECTILE DYSFUNCTION, ORGANIC   . GERD   . Gout, unspecified   . Heart murmur   . HYPERLIPIDEMIA   . HYPERTENSION   . 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 Pericarditis    recurrent  . POLYNEUROPATHY   . PULMONARY SARCOIDOSIS    Mediastinal lymphadenopathy with biospy proven sarcodosis  . Seizures (HCP[facility]   "none in 4-5 years; don't know what kind; not related to alcohol" (04/13/2016)  . TESTICULAR HYPOFUNCTION   . TIA (transient ischemic attack)    "I don't remember when" (04/13/2016)   Family History  Problem Relation Age of Onset  . Diabetes Mother   . Hypertension Mother   . Diabetes Father   . Heart disease Father [id number]     Fatal MI  . Heart failure Father    Past Surgical History:  Procedure Laterality Date  . BRONCHOSCOPY  08/21/08  . CARDIAC CATHETERIZATION  06/25/2009   minimal disease  . CARDIAC CATHETERIZATION N/A 04/14/2016   Procedure: Left Heart Cath and Coronary Angiography;  Surgeon: ChBurnell BlanksMD;  [facility];   Service: Cardiovascular;  Laterality: N/A;  . LEFT HEART CATHETERIZATION WITH CORONARY ANGIOGRAM N/A 05/18/2014   Procedure: LEFT HEART CATHETERIZATION WITH CORONARY ANGIOGRAM;  Surgeon: JaJettie BoozeMD;  [facility];  Service: Cardiovascular;  Laterality: N/A;  . MEDIASTINOSCOPY  11/30/08   Social History   Socioeconomic History  . Marital status: Significant Other    Spouse name: Not on file  . Number of children: Not on file  . Years of education: Not on file  . Highest education level: Not on file  Social Needs  . Financial resource strain: Not on file  . Food insecurity - worry: Not on file  . Food insecurity - inability: Not on file  . Transportation needs - medical: Not on file  . Transportation needs - non-medical: Not on file  Occupational History  . Not on file  Tobacco Use  . Smoking  status: Former Smoker    Packs/day: 1.00    Years: 20.00    Pack years: 20.00    Types: Cigarettes    Last attempt to quit: 05/18/2014    Years since quitting: 3.1  . Smokeless tobacco: Never Used  Substance and Sexual Activity  . Alcohol use: Yes    Alcohol/week: 0.0 oz    Comment: 04/13/2016 "glass of wine a couple times/month"  . Drug use: No  . Sexual activity: Yes  Other Topics Concern  . Not on file  Social History Narrative   Married, lives with wife and 3 kids.    Currently student. 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  Cardiovascular: negative for chest pain, dyspnea on exertion, edema, orthopnea, palpitations, paroxysmal nocturnal dyspnea or shortness of breath Dermatological: negative for rash Respiratory: negative for cough or wheezing Urologic: negative for hematuria Abdominal: negative for nausea, vomiting, diarrhea, bright red blood per rectum, melena, or hematemesis Neurologic: negative for visual changes, syncope, or dizziness All other  systems reviewed and are otherwise negative except as noted above.   Physical Exam:  Blood pressure (!) 138/98, pulse 82, height _0  (1.905 m), weight 292 lb (132.5 kg).  General appearance: alert, cooperative and no distress Neck: no adenopathy, no carotid bruit and no JVD Lungs: clear to auscultation bilaterally Heart: regular rate and rhythm, S1, S2 normal, no murmur, click, rub or gallop Extremities: extremities normal, atraumatic, no cyanosis or edema Pulses: 2+ and symmetric Skin: Skin color, texture, turgor normal. No rashes or lesions Neurologic: Grossly normal  EKG NSR, 82 bpm. No change from previous  -- personally reviewed   ASSESSMENT AND PLAN:   1. Nonobstructive CAD: noted on cath in 2015 as outlined above. Risk factor modification recommended. He has done well. No anginal symptoms. Continue ASA, statin and antihypertensives. In encouraged exercise. Regular walking.   2. HTN: mildly elevated but hasn't taken BP meds yet. Goal HTN given DM is < 130/80. On ARB, HCTZ and torsemide. PCP to check labs tomorrow.   3. HLD: followed by PCP. Recently started on Crestor given elevated LDL at 129 mg/dL. He will get repeat labs at PCP tomorrow. Goal LDL is < 70 mg/DL.   4. DM: followed by PCP. Last HGb a1c was 7.3. Goal is < 7.0.    Follow-Up: continue medical therapy for risk factor modification. F/u with PCP tomorrow. F/u with Dr. Meda Coffee in 1 year.   Dimitrious Micciche Ladoris Gene, MHS [facility] 06/28/2017 9:19 AM

## 2017-06-28 NOTE — Patient Instructions (Signed)
Medication Instructions:  Your physician recommends that you continue on your current medications as directed. Please refer to the Current Medication list given to you today.  Refills have been sent in for Nitrostat and Torsemide  Labwork: Not ordered today. You will have these done at your primary care doctor.  Testing/Procedures: Not ordered today.  Follow-Up: Your physician wants you to follow-up in 1 year with Dr. Delton See.  You will receive a reminder letter in the mail two months in advance. If you don't receive a letter, please call our office to schedule the follow-up appointment.   Any Other Special Instructions Will Be Listed Below (If Applicable).  As you discussed with Boyce Medici, PA-C please keep the following numbers in mind.  She would like your LDL less than 70.  She would like your Hemoglobin A1C less than 7.  She would like your blood pressure 130/80 or less.  Walk for exercise.   If you need a refill on your cardiac medications before your next appointment, please call your pharmacy.

## 2017-06-29 ENCOUNTER — Encounter: Payer: Self-pay | Admitting: Internal Medicine

## 2017-06-29 ENCOUNTER — Ambulatory Visit: Payer: Medicare Other | Admitting: Internal Medicine

## 2017-06-29 ENCOUNTER — Other Ambulatory Visit (INDEPENDENT_AMBULATORY_CARE_PROVIDER_SITE_OTHER): Payer: Medicare Other

## 2017-06-29 VITALS — BP 136/96 | HR 92 | Temp 98.6°F | Resp 16 | Ht 75.0 in | Wt 294.0 lb

## 2017-06-29 DIAGNOSIS — I10 Essential (primary) hypertension: Secondary | ICD-10-CM

## 2017-06-29 DIAGNOSIS — Z6835 Body mass index (BMI) 35.0-35.9, adult: Secondary | ICD-10-CM | POA: Diagnosis not present

## 2017-06-29 DIAGNOSIS — E669 Obesity, unspecified: Secondary | ICD-10-CM

## 2017-06-29 DIAGNOSIS — E1169 Type 2 diabetes mellitus with other specified complication: Secondary | ICD-10-CM

## 2017-06-29 LAB — URINALYSIS, ROUTINE W REFLEX MICROSCOPIC
Bilirubin Urine: NEGATIVE
Hgb urine dipstick: NEGATIVE
Ketones, ur: NEGATIVE
Leukocytes, UA: NEGATIVE
Nitrite: NEGATIVE
RBC / HPF: NONE SEEN (ref 0–?)
Specific Gravity, Urine: 1.015 (ref 1.000–1.030)
Total Protein, Urine: NEGATIVE
Urine Glucose: NEGATIVE
Urobilinogen, UA: 0.2 (ref 0.0–1.0)
WBC, UA: NONE SEEN (ref 0–?)
pH: 7 (ref 5.0–8.0)

## 2017-06-29 LAB — HEMOGLOBIN A1C: Hgb A1c MFr Bld: 7.2 % — ABNORMAL HIGH (ref 4.6–6.5)

## 2017-06-29 LAB — CBC WITH DIFFERENTIAL/PLATELET
Basophils Absolute: 0 10*3/uL (ref 0.0–0.1)
Basophils Relative: 0.6 % (ref 0.0–3.0)
Eosinophils Absolute: 0.2 10*3/uL (ref 0.0–0.7)
Eosinophils Relative: 3.5 % (ref 0.0–5.0)
HCT: 42.9 % (ref 39.0–52.0)
Hemoglobin: 14.7 g/dL (ref 13.0–17.0)
Lymphocytes Relative: 34.9 % (ref 12.0–46.0)
Lymphs Abs: 2.2 10*3/uL (ref 0.7–4.0)
MCHC: 34.2 g/dL (ref 30.0–36.0)
MCV: 79.5 fl (ref 78.0–100.0)
Monocytes Absolute: 0.5 10*3/uL (ref 0.1–1.0)
Monocytes Relative: 8.2 % (ref 3.0–12.0)
Neutro Abs: 3.3 10*3/uL (ref 1.4–7.7)
Neutrophils Relative %: 52.8 % (ref 43.0–77.0)
Platelets: 242 10*3/uL (ref 150.0–400.0)
RBC: 5.39 Mil/uL (ref 4.22–5.81)
RDW: 15.1 % (ref 11.5–15.5)
WBC: 6.3 10*3/uL (ref 4.0–10.5)

## 2017-06-29 LAB — BASIC METABOLIC PANEL
BUN: 16 mg/dL (ref 6–23)
CO2: 28 mEq/L (ref 19–32)
Calcium: 9.4 mg/dL (ref 8.4–10.5)
Chloride: 101 mEq/L (ref 96–112)
Creatinine, Ser: 0.94 mg/dL (ref 0.40–1.50)
GFR: 111.84 mL/min (ref >60.00)
Glucose, Bld: 176 mg/dL — ABNORMAL HIGH (ref 70–99)
Potassium: 3.9 mEq/L (ref 3.5–5.1)
Sodium: 137 mEq/L (ref 135–145)

## 2017-06-29 LAB — MICROALBUMIN / CREATININE URINE RATIO
Creatinine,U: 125.2 mg/dL
Microalb Creat Ratio: 1.6 mg/g (ref 0.0–30.0)
Microalb, Ur: 2 mg/dL — ABNORMAL HIGH (ref 0.0–1.9)

## 2017-06-29 MED ORDER — IRBESARTAN-HYDROCHLOROTHIAZIDE 300-12.5 MG PO TABS: 1.0000 | ORAL_TABLET | Freq: Every day | ORAL | 1 refills | Status: DC

## 2017-06-29 MED ORDER — CARVEDILOL 3.125 MG PO TABS: 3.1250 mg | ORAL_TABLET | Freq: Two times a day (BID) | ORAL | 0 refills | Status: DC

## 2017-06-29 MED ORDER — LORCASERIN HCL ER 20 MG PO TB24: 1.0000 | ORAL_TABLET | Freq: Every day | ORAL | 5 refills | Status: DC
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
## 2017-06-29 NOTE — Patient Instructions (Signed)

## 2017-07-01 DIAGNOSIS — E291 Testicular hypofunction: Secondary | ICD-10-CM | POA: Diagnosis not present

## 2017-07-01 DIAGNOSIS — N401 Enlarged prostate with lower urinary tract symptoms: Secondary | ICD-10-CM | POA: Diagnosis not present

## 2017-07-01 DIAGNOSIS — R350 Frequency of micturition: Secondary | ICD-10-CM | POA: Diagnosis not present

## 2017-07-02 DIAGNOSIS — E291 Testicular hypofunction: Secondary | ICD-10-CM | POA: Diagnosis not present

## 2017-07-08 ENCOUNTER — Telehealth: Payer: Self-pay

## 2017-07-08 NOTE — Telephone Encounter (Signed)
Key: [id number] for Prior authorization request for Belviq XR 20 mg tablets. Auth currently pending.

## 2017-07-09 ENCOUNTER — Telehealth: Payer: Self-pay

## 2017-07-09 DIAGNOSIS — J45990 Exercise induced bronchospasm: Secondary | ICD-10-CM

## 2017-07-09 NOTE — Telephone Encounter (Signed)
Copied from CRM [id number]. Topic: Quick Communication - Rx Refill/Question >> Jul 09, 2017 10:05 AM [name] wrote: Medication: Lorcaserin HCl ER (BELVIQ XR) 20 MG TB24 [name] with [name] /[employer] called to advise this med has been denied. Medicare does not cover any weight loss meds and that is what this is considered

## 2017-07-12 ENCOUNTER — Ambulatory Visit (INDEPENDENT_AMBULATORY_CARE_PROVIDER_SITE_OTHER): Payer: Medicare Other | Admitting: Endocrinology

## 2017-07-12 ENCOUNTER — Encounter: Payer: Self-pay | Admitting: Endocrinology

## 2017-07-12 VITALS — BP 150/99 | HR 93 | Wt 296.6 lb

## 2017-07-12 DIAGNOSIS — E1169 Type 2 diabetes mellitus with other specified complication: Secondary | ICD-10-CM

## 2017-07-12 DIAGNOSIS — E669 Obesity, unspecified: Secondary | ICD-10-CM

## 2017-07-12 MED ORDER — CANAGLIFLOZIN 100 MG PO TABS: 100.0000 mg | ORAL_TABLET | Freq: Every day | ORAL | 11 refills | Status: DC

## 2017-07-12 MED ORDER — ALBUTEROL SULFATE HFA 108 (90 BASE) MCG/ACT IN AERS: 2.0000 | INHALATION_SPRAY | Freq: Four times a day (QID) | RESPIRATORY_TRACT | 1 refills | Status: DC | PRN

## 2017-07-12 NOTE — Telephone Encounter (Signed)
Pt informed of same. Pt stated understanding.  

## 2017-07-12 NOTE — Addendum Note (Signed)
Addended by: Verlan Friends on: 07/12/2017 09:37 AM   Modules accepted: Orders

## 2017-07-12 NOTE — Progress Notes (Signed)
 Subjective:    Patient ID: [name], male    DOB: 02/02/1971, 47 y.o.   MRN: [id number]  HPI Pt returns for f/u of diabetes mellitus:  DM type: 2 Dx'ed: 2008 Complications: CAD Therapy: 3 oral meds.   DKA: never Severe hypoglycemia: never.  Pancreatitis: never.   Pancreatic imaging: 2011 CT: mild fatty replacement in the head and uncinate process.   Interval history: he takes meds as rx'ed.  pt states he feels well in general.   Past Medical History:  Diagnosis Date  . Anginal pain (HCC)   . Arthritis    ? 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 Followed by Dr. Hawkes  . Arthritis    "ankles" (04/13/2016)  . Asthma   . CORONARY ARTERY DISEASE    a. cath 05/2014: mild LAD disease, normal LCx and RCA. Risk factor modification recommended.  . DIABETES MELLITUS, TYPE II   . Edema   . ERECTILE DYSFUNCTION, ORGANIC   . GERD   . Gout, unspecified   . Heart murmur   . HYPERLIPIDEMIA   . HYPERTENSION   . 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 Pericarditis    recurrent  . POLYNEUROPATHY   . PULMONARY SARCOIDOSIS    Mediastinal lymphadenopathy with biospy proven sarcodosis  . Seizures (HCC)    "none in 4-5 years; don't know what kind; not related to alcohol" (04/13/2016)  . TESTICULAR HYPOFUNCTION   . TIA (transient ischemic attack)    "I don't remember when" (04/13/2016)    Past Surgical History:  Procedure Laterality Date  . BRONCHOSCOPY  08/21/08  . CARDIAC CATHETERIZATION  06/25/2009   minimal disease  . CARDIAC CATHETERIZATION N/A 04/14/2016   Procedure: Left Heart Cath and Coronary Angiography;  Surgeon: Christopher D McAlhany, MD;  Location: [facility];  Service: Cardiovascular;  Laterality: N/A;  . LEFT HEART CATHETERIZATION WITH CORONARY ANGIOGRAM N/A 05/18/2014   Procedure: LEFT HEART CATHETERIZATION WITH CORONARY ANGIOGRAM;  Surgeon: Jayadeep S Varanasi, MD;  Location: [facility];  Service: Cardiovascular;   Laterality: N/A;  . MEDIASTINOSCOPY  11/30/08    Social History   Socioeconomic History  . Marital status: Significant Other    Spouse name: Not on file  . Number of children: Not on file  . Years of education: Not on file  . Highest education level: Not on file  Social Needs  . Financial resource strain: Not on file  . Food insecurity - worry: Not on file  . Food insecurity - inability: Not on file  . Transportation needs - medical: Not on file  . Transportation needs - non-medical: Not on file  Occupational History  . Not on file  Tobacco Use  . Smoking status: Former Smoker    Packs/day: 1.00    Years: 20.00    Pack years: 20.00    Types: Cigarettes    Last attempt to quit: 05/18/2014    Years since quitting: 3.1  . Smokeless tobacco: Never Used  Substance and Sexual Activity  . Alcohol use: Yes    Alcohol/week: 0.0 oz    Comment: 04/13/2016 "glass of wine a couple times/month"  . Drug use: No  . Sexual activity: Yes  Other Topics Concern  . Not on file  Social History Narrative   Married, lives with wife and 3 kids.    Currently student. 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 albuterol (VENTOLIN HFA) 108 (90 Base) MCG/ACT inhaler Inhale 2 puffs into the lungs every 6 (six) hours as needed for wheezing or shortness of breath. 3 Inhaler 1  . allopurinol (ZYLOPRIM) 300 MG tablet TAKE 1 TABLET(300 MG) BY MOUTH DAILY 90 tablet 0  . aspirin EC 81 MG tablet Take 1 tablet (81 mg total) by mouth daily. 30 tablet 3  . Blood Glucose Monitoring Suppl (ONETOUCH VERIO FLEX SYSTEM) w/Device KIT 1 Act by Does not apply route 3 (three) times daily with meals as needed. 2 kit 0  . carvedilol (COREG) 3.125 MG tablet Take 1 tablet (3.125 mg total) by mouth 2 (two) times daily with a meal. 180 tablet 0  . cholecalciferol (VITAMIN D) 1000 units tablet Take 1,000 Units by mouth daily.      . folic acid (FOLVITE) 1 MG tablet TK 1 T PO QD  1  . glucose blood (ONETOUCH VERIO) test strip Use TID 100 each 11  . irbesartan-hydrochlorothiazide (AVALIDE) 300-12.5 MG tablet Take 1 tablet by mouth daily. 90 tablet 1  . Lorcaserin HCl ER (BELVIQ XR) 20 MG TB24 Take 1 tablet by mouth daily. 30 tablet 5  . metFORMIN (GLUCOPHAGE-XR) 500 MG 24 hr tablet TAKE 1 TABLET(500 MG) BY MOUTH TWICE DAILY WITH A MEAL 180 tablet 1  . nitroGLYCERIN (NITROSTAT) 0.4 MG SL tablet Place 1 tablet (0.4 mg total) under the tongue every 5 (five) minutes as needed for chest pain. 25 tablet 3  . pioglitazone (ACTOS) 15 MG tablet Take 1 tablet (15 mg total) by mouth daily. 30 tablet 11  . potassium chloride SA (K-DUR,KLOR-CON) 20 MEQ tablet TAKE 1 TABLET(20 MEQ) BY MOUTH TWICE DAILY 180 tablet 0  . rosuvastatin (CRESTOR) 40 MG tablet Take 1 tablet (40 mg total) by mouth daily. 90 tablet 1  . sitaGLIPtin ([name]) 100 MG tablet Take 1 tablet (100 mg total) by mouth daily. 30 tablet 11   No current facility-administered medications on file prior to visit.     Allergies  Allergen Reactions  . Amlodipine Other (See Comments)    Ankle swelling with 10mg, ok with 5mg dose  . Sulfamethoxazole Rash    fever  . Sulfonamide Derivatives Rash and Other (See Comments)    Family History  Problem Relation Age of Onset  . Diabetes Mother   . Hypertension Mother   . Diabetes Father   . Heart disease Father 46       Fatal MI  . Heart failure Father     BP (!) 150/99 (BP Location: Left Arm, Patient Position: Sitting, Cuff Size: Normal)   Pulse 93   Wt 296 lb 9.6 oz (134.5 kg)   SpO2 97%   BMI 37.07 kg/m    Review of Systems He denies hypoglycemia    Objective:   Physical Exam VITAL SIGNS:  See vs page GENERAL: no distress Pulses: foot pulses are intact bilaterally.   MSK: no deformity of the feet or ankles.  CV: trace bilat edema of the legs.   Skin:  no ulcer on the feet or ankles.  normal color and temp  on the feet and ankles.   Neuro: sensation is intact to touch on the feet and ankles.     Lab Results  Component Value Date   HGBA1C 7.2 (H) 06/29/2017   Lab Results  Component Value Date   CREATININE 0.94 06/29/2017   BUN 16 06/29/2017   NA 137 06/29/2017     K 3.9 06/29/2017   CL 101 06/29/2017   CO2 28 06/29/2017       Assessment & Plan:  Type 2 DM: he needs increased rx, if it can be done with a regimen that avoids or minimizes hypoglycemia. edema: new.  Mild.  We'll follow.    Patient Instructions  I have sent a prescription to your pharmacy, to add "invokana."  check your blood sugar once a day.  vary the time of day when you check, between before the 3 meals, and at bedtime.  also check if you have symptoms of your blood sugar being too high or too low.  please keep a record of the readings and bring it to your next appointment here (or you can bring the meter itself).  You can write it on any piece of paper.  please call us sooner if your blood sugar goes below 70, or if you have a lot of readings over 200. Please come back for a follow-up appointment in 4 months.     

## 2017-07-12 NOTE — Patient Instructions (Addendum)
I have sent a prescription to your pharmacy, to add "invokana."  check your blood sugar once a day.  vary the time of day when you check, between before the 3 meals, and at bedtime.  also check if you have symptoms of your blood sugar being too high or too low.  please keep a record of the readings and bring it to your next appointment here (or you can bring the meter itself).  You can write it on any piece of paper.  please call us sooner if your blood sugar goes below 70, or if you have a lot of readings over 200. Please come back for a follow-up appointment in 4 months.

## 2017-07-19 ENCOUNTER — Telehealth: Payer: Self-pay

## 2017-07-19 ENCOUNTER — Other Ambulatory Visit: Payer: Self-pay

## 2017-07-19 ENCOUNTER — Telehealth: Payer: Self-pay | Admitting: Endocrinology

## 2017-07-19 MED ORDER — EMPAGLIFLOZIN 10 MG PO TABS: 10.0000 mg | ORAL_TABLET | Freq: Every day | ORAL | 3 refills | Status: DC

## 2017-07-19 NOTE — Telephone Encounter (Signed)
Pt stated that [facility] was suppose to be faxing over a paper for his medication that we are suppose to be filling out and faxing to his insurance. He is wanting to see if we have received that paper.   Please advise

## 2017-07-19 NOTE — Telephone Encounter (Signed)
Ordered Jardiance 10 mg daily to replace Invokana per Dr. Everardo All

## 2017-07-20 ENCOUNTER — Other Ambulatory Visit: Payer: Self-pay

## 2017-07-20 MED ORDER — DAPAGLIFLOZIN PROPANEDIOL 5 MG PO TABS: 5.0000 mg | ORAL_TABLET | Freq: Every day | ORAL | 11 refills | Status: DC

## 2017-07-20 NOTE — Telephone Encounter (Signed)
farxiga 5 mg qd.  Thanks.

## 2017-07-20 NOTE — Telephone Encounter (Signed)
Would you like me to complete PA or try to switch to [place] or Jardiance? Please advise?

## 2017-07-20 NOTE — Telephone Encounter (Signed)
I called and LVM stating that prescription for invokana was being changed to farxiga.

## 2017-07-26 ENCOUNTER — Other Ambulatory Visit: Payer: Self-pay | Admitting: Internal Medicine

## 2017-08-09 DIAGNOSIS — R404 Transient alteration of awareness: Secondary | ICD-10-CM | POA: Diagnosis not present

## 2017-08-09 DIAGNOSIS — G40209 Localization-related (focal) (partial) symptomatic epilepsy and epileptic syndromes with complex partial seizures, not intractable, without status epilepticus: Secondary | ICD-10-CM | POA: Diagnosis not present

## 2017-09-06 DIAGNOSIS — M10052 Idiopathic gout, left hip: Secondary | ICD-10-CM | POA: Diagnosis not present

## 2017-09-06 DIAGNOSIS — I158 Other secondary hypertension: Secondary | ICD-10-CM | POA: Diagnosis not present

## 2017-09-06 DIAGNOSIS — M109 Gout, unspecified: Secondary | ICD-10-CM | POA: Diagnosis not present

## 2017-09-06 DIAGNOSIS — D860 Sarcoidosis of lung: Secondary | ICD-10-CM | POA: Diagnosis not present

## 2017-09-06 LAB — HM DIABETES EYE EXAM

## 2017-09-10 ENCOUNTER — Other Ambulatory Visit: Payer: Self-pay | Admitting: Internal Medicine

## 2017-09-13 DIAGNOSIS — D869 Sarcoidosis, unspecified: Secondary | ICD-10-CM | POA: Diagnosis not present

## 2017-09-13 DIAGNOSIS — E119 Type 2 diabetes mellitus without complications: Secondary | ICD-10-CM | POA: Diagnosis not present

## 2017-09-13 DIAGNOSIS — H11153 Pinguecula, bilateral: Secondary | ICD-10-CM | POA: Diagnosis not present

## 2017-09-13 DIAGNOSIS — I10 Essential (primary) hypertension: Secondary | ICD-10-CM | POA: Diagnosis not present

## 2017-09-13 DIAGNOSIS — H5212 Myopia, left eye: Secondary | ICD-10-CM | POA: Diagnosis not present

## 2017-09-13 LAB — HM DIABETES EYE EXAM

## 2017-09-14 ENCOUNTER — Encounter (HOSPITAL_COMMUNITY): Payer: Self-pay | Admitting: Emergency Medicine

## 2017-09-14 ENCOUNTER — Emergency Department (HOSPITAL_COMMUNITY): Payer: Medicare Other

## 2017-09-14 ENCOUNTER — Other Ambulatory Visit: Payer: Self-pay

## 2017-09-14 ENCOUNTER — Emergency Department (HOSPITAL_COMMUNITY)
Admission: EM | Admit: 2017-09-14 | Discharge: 2017-09-14 | Disposition: A | Discharge status: Home / Self Care | Payer: Medicare Other | Attending: Emergency Medicine | Admitting: Emergency Medicine

## 2017-09-14 DIAGNOSIS — Z5321 Procedure and treatment not carried out due to patient leaving prior to being seen by health care provider: Secondary | ICD-10-CM | POA: Diagnosis not present

## 2017-09-14 DIAGNOSIS — R079 Chest pain, unspecified: Secondary | ICD-10-CM | POA: Diagnosis not present

## 2017-09-14 LAB — BASIC METABOLIC PANEL
Anion gap: 15 (ref 5–15)
BUN: 14 mg/dL (ref 6–20)
CO2: 28 mmol/L (ref 22–32)
Calcium: 9.3 mg/dL (ref 8.9–10.3)
Chloride: 93 mmol/L — ABNORMAL LOW (ref 101–111)
Creatinine, Ser: 1.13 mg/dL (ref 0.61–1.24)
GFR calc Af Amer: >60 mL/min (ref >60)
GFR calc non Af Amer: >60 mL/min (ref >60)
Glucose, Bld: 165 mg/dL — ABNORMAL HIGH (ref 65–99)
Potassium: 3.2 mmol/L — ABNORMAL LOW (ref 3.5–5.1)
Sodium: 136 mmol/L (ref 135–145)

## 2017-09-14 LAB — CBC
HCT: 48.1 % (ref 39.0–52.0)
Hemoglobin: 17 g/dL (ref 13.0–17.0)
MCH: 27.9 pg (ref 26.0–34.0)
MCHC: 35.3 g/dL (ref 30.0–36.0)
MCV: 79 fL (ref 78.0–100.0)
Platelets: 326 10*3/uL (ref 150–400)
RBC: 6.09 MIL/uL — ABNORMAL HIGH (ref 4.22–5.81)
RDW: 14.6 % (ref 11.5–15.5)
WBC: 13.7 10*3/uL — ABNORMAL HIGH (ref 4.0–10.5)

## 2017-09-14 LAB — I-STAT TROPONIN, ED: Troponin i, poc: 0 ng/mL (ref 0.00–0.08)

## 2017-09-14 NOTE — ED Triage Notes (Signed)
Pt report left sided chest pain that started yesterday but woke him up out of a sleep this morning.  Pt SOB, diaphoresis, n/v.  Pt reports he can't lay down w/ the pain Pt reports he took a nitro at the house which decreased the pain but he then got light headed and nausea.

## 2017-09-15 ENCOUNTER — Telehealth: Payer: Self-pay | Admitting: Internal Medicine

## 2017-09-15 ENCOUNTER — Ambulatory Visit: Payer: Self-pay | Admitting: *Deleted

## 2017-09-15 ENCOUNTER — Ambulatory Visit (INDEPENDENT_AMBULATORY_CARE_PROVIDER_SITE_OTHER)
Admission: RE | Admit: 2017-09-15 | Discharge: 2017-09-15 | Disposition: A | Discharge status: Home / Self Care | Payer: Medicare Other | Source: Ambulatory Visit | Attending: Internal Medicine | Admitting: Internal Medicine

## 2017-09-15 ENCOUNTER — Encounter: Payer: Self-pay | Admitting: Internal Medicine

## 2017-09-15 ENCOUNTER — Other Ambulatory Visit (INDEPENDENT_AMBULATORY_CARE_PROVIDER_SITE_OTHER): Payer: Medicare Other

## 2017-09-15 ENCOUNTER — Ambulatory Visit (HOSPITAL_COMMUNITY)
Admission: RE | Admit: 2017-09-15 | Discharge: 2017-09-15 | Disposition: A | Discharge status: Home / Self Care | Payer: Medicare Other | Source: Ambulatory Visit | Attending: Internal Medicine | Admitting: Internal Medicine

## 2017-09-15 ENCOUNTER — Emergency Department (HOSPITAL_COMMUNITY)
Admission: EM | Admit: 2017-09-15 | Discharge: 2017-09-16 | Disposition: A | Discharge status: Home / Self Care | Payer: Medicare Other | Attending: Emergency Medicine | Admitting: Emergency Medicine

## 2017-09-15 ENCOUNTER — Ambulatory Visit: Payer: Medicare Other | Admitting: Internal Medicine

## 2017-09-15 ENCOUNTER — Encounter (HOSPITAL_COMMUNITY): Payer: Self-pay | Admitting: Emergency Medicine

## 2017-09-15 ENCOUNTER — Other Ambulatory Visit: Payer: Self-pay

## 2017-09-15 VITALS — BP 130/80 | HR 99 | Temp 98.1°F | Ht 75.0 in | Wt 279.0 lb

## 2017-09-15 DIAGNOSIS — K859 Acute pancreatitis without necrosis or infection, unspecified: Secondary | ICD-10-CM | POA: Diagnosis not present

## 2017-09-15 DIAGNOSIS — R1030 Lower abdominal pain, unspecified: Secondary | ICD-10-CM | POA: Diagnosis not present

## 2017-09-15 DIAGNOSIS — R112 Nausea with vomiting, unspecified: Secondary | ICD-10-CM | POA: Diagnosis not present

## 2017-09-15 DIAGNOSIS — I2510 Atherosclerotic heart disease of native coronary artery without angina pectoris: Secondary | ICD-10-CM | POA: Diagnosis not present

## 2017-09-15 DIAGNOSIS — Z7984 Long term (current) use of oral hypoglycemic drugs: Secondary | ICD-10-CM | POA: Diagnosis not present

## 2017-09-15 DIAGNOSIS — R1084 Generalized abdominal pain: Secondary | ICD-10-CM | POA: Diagnosis not present

## 2017-09-15 DIAGNOSIS — K760 Fatty (change of) liver, not elsewhere classified: Secondary | ICD-10-CM | POA: Insufficient documentation

## 2017-09-15 DIAGNOSIS — I10 Essential (primary) hypertension: Secondary | ICD-10-CM | POA: Diagnosis not present

## 2017-09-15 DIAGNOSIS — R10817 Generalized abdominal tenderness: Secondary | ICD-10-CM | POA: Diagnosis not present

## 2017-09-15 DIAGNOSIS — R109 Unspecified abdominal pain: Secondary | ICD-10-CM | POA: Diagnosis not present

## 2017-09-15 DIAGNOSIS — Z87891 Personal history of nicotine dependence: Secondary | ICD-10-CM | POA: Diagnosis not present

## 2017-09-15 DIAGNOSIS — Z7982 Long term (current) use of aspirin: Secondary | ICD-10-CM | POA: Insufficient documentation

## 2017-09-15 DIAGNOSIS — E876 Hypokalemia: Secondary | ICD-10-CM | POA: Diagnosis not present

## 2017-09-15 DIAGNOSIS — Z79899 Other long term (current) drug therapy: Secondary | ICD-10-CM | POA: Diagnosis not present

## 2017-09-15 DIAGNOSIS — N400 Enlarged prostate without lower urinary tract symptoms: Secondary | ICD-10-CM

## 2017-09-15 DIAGNOSIS — E119 Type 2 diabetes mellitus without complications: Secondary | ICD-10-CM | POA: Insufficient documentation

## 2017-09-15 DIAGNOSIS — J45909 Unspecified asthma, uncomplicated: Secondary | ICD-10-CM | POA: Insufficient documentation

## 2017-09-15 DIAGNOSIS — R1110 Vomiting, unspecified: Secondary | ICD-10-CM | POA: Diagnosis not present

## 2017-09-15 DIAGNOSIS — R1033 Periumbilical pain: Secondary | ICD-10-CM | POA: Diagnosis present

## 2017-09-15 LAB — LIPASE: Lipase: 19 U/L (ref 11.0–59.0)

## 2017-09-15 LAB — URINALYSIS, ROUTINE W REFLEX MICROSCOPIC
Bilirubin Urine: NEGATIVE
Hgb urine dipstick: NEGATIVE
Ketones, ur: NEGATIVE
Leukocytes, UA: NEGATIVE
Nitrite: NEGATIVE
RBC / HPF: NONE SEEN (ref 0–?)
Specific Gravity, Urine: 1.01 (ref 1.000–1.030)
Total Protein, Urine: NEGATIVE
Urine Glucose: >=1000 — AB
Urobilinogen, UA: 0.2 (ref 0.0–1.0)
WBC, UA: NONE SEEN (ref 0–?)
pH: 8.5 — AB (ref 5.0–8.0)

## 2017-09-15 LAB — COMPREHENSIVE METABOLIC PANEL
ALT: 56 U/L — ABNORMAL HIGH (ref 0–53)
AST: 24 U/L (ref 0–37)
Albumin: 4.4 g/dL (ref 3.5–5.2)
Alkaline Phosphatase: 81 U/L (ref 39–117)
BUN: 9 mg/dL (ref 6–23)
CO2: 35 mEq/L — ABNORMAL HIGH (ref 19–32)
Calcium: 9.9 mg/dL (ref 8.4–10.5)
Chloride: 91 mEq/L — ABNORMAL LOW (ref 96–112)
Creatinine, Ser: 0.86 mg/dL (ref 0.40–1.50)
GFR: 123.8 mL/min (ref >60.00)
Glucose, Bld: 146 mg/dL — ABNORMAL HIGH (ref 70–99)
Potassium: 3 mEq/L — ABNORMAL LOW (ref 3.5–5.1)
Sodium: 134 mEq/L — ABNORMAL LOW (ref 135–145)
Total Bilirubin: 1.2 mg/dL (ref 0.2–1.2)
Total Protein: 8.3 g/dL (ref 6.0–8.3)

## 2017-09-15 LAB — CBC WITH DIFFERENTIAL/PLATELET
Basophils Absolute: 0.2 10*3/uL — ABNORMAL HIGH (ref 0.0–0.1)
Basophils Relative: 1.2 % (ref 0.0–3.0)
Eosinophils Absolute: 0.2 10*3/uL (ref 0.0–0.7)
Eosinophils Relative: 1.7 % (ref 0.0–5.0)
HCT: 48.4 % (ref 39.0–52.0)
Hemoglobin: 16.7 g/dL (ref 13.0–17.0)
Lymphocytes Relative: 30.4 % (ref 12.0–46.0)
Lymphs Abs: 3.8 10*3/uL (ref 0.7–4.0)
MCHC: 34.4 g/dL (ref 30.0–36.0)
MCV: 79.6 fl (ref 78.0–100.0)
Monocytes Absolute: 1.3 10*3/uL — ABNORMAL HIGH (ref 0.1–1.0)
Monocytes Relative: 10.2 % (ref 3.0–12.0)
Neutro Abs: 7.1 10*3/uL (ref 1.4–7.7)
Neutrophils Relative %: 56.5 % (ref 43.0–77.0)
Platelets: 270 10*3/uL (ref 150.0–400.0)
RBC: 6.07 Mil/uL — ABNORMAL HIGH (ref 4.22–5.81)
RDW: 14.7 % (ref 11.5–15.5)
WBC: 12.5 10*3/uL — ABNORMAL HIGH (ref 4.0–10.5)

## 2017-09-15 LAB — SEDIMENTATION RATE: Sed Rate: 28 mm/hr — ABNORMAL HIGH (ref 0–15)

## 2017-09-15 LAB — AMYLASE: Amylase: 48 U/L (ref 27–131)

## 2017-09-15 MED ORDER — OXYCODONE-ACETAMINOPHEN 5-325 MG PO TABS: 1.0000 | ORAL_TABLET | ORAL | 0 refills | Status: DC | PRN

## 2017-09-15 MED ORDER — IOHEXOL 300 MG/ML  SOLN
100.0000 mL | Freq: Once | INTRAMUSCULAR | Status: AC | PRN
  Administered 2017-09-15: 100 mL via INTRAVENOUS

## 2017-09-15 MED ORDER — MORPHINE SULFATE (PF) 4 MG/ML IV SOLN
4.0000 mg | Freq: Once | INTRAVENOUS | Status: AC
  Administered 2017-09-15: 4 mg via INTRAVENOUS
  Filled 2017-09-15: qty 1

## 2017-09-15 MED ORDER — IOPAMIDOL (ISOVUE-300) INJECTION 61%
30.0000 mL | Freq: Once | INTRAVENOUS | Status: AC
  Administered 2017-09-15: 30 mL via ORAL

## 2017-09-15 MED ORDER — ONDANSETRON 4 MG PO TBDP: ORAL_TABLET | ORAL | 0 refills | Status: DC

## 2017-09-15 MED ORDER — SODIUM CHLORIDE 0.9 % IV BOLUS
1000.0000 mL | Freq: Once | INTRAVENOUS | Status: AC
  Administered 2017-09-15: 1000 mL via INTRAVENOUS

## 2017-09-15 MED ORDER — POTASSIUM CHLORIDE CRYS ER 20 MEQ PO TBCR
40.0000 meq | EXTENDED_RELEASE_TABLET | Freq: Once | ORAL | Status: AC
  Administered 2017-09-16: 40 meq via ORAL
  Filled 2017-09-15: qty 2

## 2017-09-15 MED ORDER — ONDANSETRON 4 MG PO TBDP
4.0000 mg | ORAL_TABLET | Freq: Once | ORAL | Status: AC | PRN
  Administered 2017-09-15: 4 mg via ORAL
  Filled 2017-09-15: qty 1

## 2017-09-15 MED ORDER — IOPAMIDOL (ISOVUE-300) INJECTION 61%
INTRAVENOUS | Status: AC
  Filled 2017-09-15: qty 30

## 2017-09-15 NOTE — Progress Notes (Signed)
Subjective:  Patient ID: [name], male    DOB: July 06, 1970  Age: 47 y.o. MRN: [id number]  CC: Abdominal Pain   HPI [name] presents for a 2-day course of diffuse abdominal pain in the mid section.  He also complains of nausea, vomiting, and loss of appetite.  He describes the pain as a stabbing sensation that radiates into his back.  He has had some improvement with bowel movement but he denies diarrhea or constipation.  One day prior to this visit he was triaged in the ED and was found to have some abnormal lab work but he says the wait was too long so he did not stay to be seen.  Outpatient Medications Prior to Visit  Medication Sig Dispense Refill  . albuterol (VENTOLIN HFA) 108 (90 Base) MCG/ACT inhaler Inhale 2 puffs into the lungs every 6 (six) hours as needed for wheezing or shortness of breath. 3 Inhaler 1  . allopurinol (ZYLOPRIM) 300 MG tablet TAKE 1 TABLET(300 MG) BY MOUTH DAILY 90 tablet 0  . aspirin EC 81 MG tablet Take 1 tablet (81 mg total) by mouth daily. 30 tablet 3  . Blood Glucose Monitoring Suppl (ONETOUCH VERIO FLEX SYSTEM) w/Device KIT 1 Act by Does not apply route 3 (three) times daily with meals as needed. 2 kit 0  . carvedilol (COREG) 3.125 MG tablet Take 1 tablet (3.125 mg total) by mouth 2 (two) times daily with a meal. 180 tablet 0  . cholecalciferol (VITAMIN D) 1000 units tablet Take 1,000 Units by mouth daily.    . folic acid (FOLVITE) 1 MG tablet TK 1 T PO QD  1  . glucose blood (ONETOUCH VERIO) test strip Use TID 100 each 11  . metFORMIN (GLUCOPHAGE-XR) 500 MG 24 hr tablet TAKE 1 TABLET(500 MG) BY MOUTH TWICE DAILY WITH A MEAL 180 tablet 0  . nitroGLYCERIN (NITROSTAT) 0.4 MG SL tablet Place 1 tablet (0.4 mg total) under the tongue every 5 (five) minutes as needed for chest pain. 25 tablet 3  . pioglitazone (ACTOS) 15 MG tablet Take 1 tablet (15 mg total) by mouth daily. 30 tablet 11  . potassium chloride SA (K-DUR,KLOR-CON) 20 MEQ tablet TAKE  1 TABLET(20 MEQ) BY MOUTH TWICE DAILY 180 tablet 0  . rosuvastatin (CRESTOR) 40 MG tablet Take 1 tablet (40 mg total) by mouth daily. 90 tablet 1  . canagliflozin (INVOKANA) 100 MG TABS tablet Take 1 tablet (100 mg total) by mouth daily before breakfast. 30 tablet 11  . dapagliflozin propanediol (FARXIGA) 5 MG TABS tablet Take 5 mg by mouth daily. 30 tablet 11  . empagliflozin (JARDIANCE) 10 MG TABS tablet Take 10 mg by mouth daily. 30 tablet 3  . irbesartan-hydrochlorothiazide (AVALIDE) 300-12.5 MG tablet Take 1 tablet by mouth daily. 90 tablet 1  . sitaGLIPtin ([name]) 100 MG tablet Take 1 tablet (100 mg total) by mouth daily. 30 tablet 11  . Lorcaserin HCl ER (BELVIQ XR) 20 MG TB24 Take 1 tablet by mouth daily. 30 tablet 5   No facility-administered medications prior to visit.     ROS Review of Systems  Constitutional: Positive for appetite change. Negative for activity change, diaphoresis, fatigue and unexpected weight change.  HENT: Negative.  Negative for trouble swallowing.   Eyes: Negative for visual disturbance.  Respiratory: Negative for cough, chest tightness, shortness of breath and wheezing.   Cardiovascular: Negative for chest pain, palpitations and leg swelling.  Gastrointestinal: Positive for abdominal pain, nausea and vomiting. Negative  for blood in stool, constipation and diarrhea.  Endocrine: Negative.   Genitourinary: Negative.  Negative for difficulty urinating.  Musculoskeletal: Negative.   Skin: Negative.  Negative for rash.  Neurological: Negative.  Negative for dizziness, weakness and light-headedness.  Hematological: Negative for adenopathy. Does not bruise/bleed easily.  Psychiatric/Behavioral: Negative.     Objective:  BP 130/80 (BP Location: Left Arm, Patient Position: Sitting, Cuff Size: Large)   Pulse 99   Temp 98.1 F (36.7 C) (Oral)   Ht 6' 3" (1.905 m)   Wt 279 lb (126.6 kg)   SpO2 96%   BMI 34.87 kg/m   BP Readings from Last 3 Encounters:    09/17/17 132/90  09/16/17 (!) 133/99  09/15/17 130/80    Wt Readings from Last 3 Encounters:  09/17/17 280 lb 0.6 oz (127 kg)  09/15/17 279 lb (126.6 kg)  09/15/17 279 lb (126.6 kg)    Physical Exam  Constitutional: He is oriented to person, place, and time. He appears well-developed and well-nourished.  Non-toxic appearance. He does not appear ill. No distress.  HENT:  Mouth/Throat: Oropharynx is clear and moist.  Eyes: No scleral icterus.  Cardiovascular: Normal rate, regular rhythm and normal heart sounds.  No murmur heard. Pulmonary/Chest: Effort normal and breath sounds normal. No stridor. No respiratory distress. He has no wheezes. He has no rhonchi. He has no rales. He exhibits no tenderness.  Abdominal: Soft. Normal appearance. He exhibits no mass. Bowel sounds are decreased. There is no hepatosplenomegaly, splenomegaly or hepatomegaly. There is tenderness in the periumbilical area and suprapubic area. There is no rigidity, no rebound, no guarding and no CVA tenderness. Hernia confirmed negative in the ventral area, confirmed negative in the right inguinal area and confirmed negative in the left inguinal area.  Musculoskeletal: Normal range of motion. He exhibits no edema or tenderness.  Neurological: He is alert and oriented to person, place, and time.  Skin: Skin is warm and dry.    Lab Results  Component Value Date   WBC 7.1 09/17/2017   HGB 15.5 09/17/2017   HCT 44.9 09/17/2017   PLT 282.0 09/17/2017   GLUCOSE 129 (H) 09/17/2017   CHOL 192 03/01/2017   TRIG 139.0 03/01/2017   HDL 35.10 (L) 03/01/2017   LDLCALC 129 (H) 03/01/2017   ALT 45 09/17/2017   AST 23 09/17/2017   NA 135 09/17/2017   K 4.0 09/17/2017   CL 94 (L) 09/17/2017   CREATININE 0.96 09/17/2017   BUN 9 09/17/2017   CO2 31 09/17/2017   TSH 0.66 03/01/2017   PSA 1.33 05/07/2016   INR 0.89 05/24/2016   HGBA1C 7.2 (H) 06/29/2017   MICROALBUR 2.0 (H) 06/29/2017    Dg Chest 2 View  Result  Date: 09/14/2017 CLINICAL DATA:  Left-sided chest pain. EXAM: CHEST - 2 VIEW COMPARISON:  Radiograph 05/07/2016 FINDINGS: The cardiomediastinal contours are normal. The lungs are clear. Pulmonary vasculature is normal. No consolidation, pleural effusion, or pneumothorax. No acute osseous abnormalities are seen. IMPRESSION: No acute pulmonary process. Electronically Signed   By: Melanie  Ehinger M.D.   On: 09/14/2017 05:43    Assessment & Plan:   Smitty was seen today for abdominal pain.  Diagnoses and all orders for this visit:  Generalized abdominal tenderness without rebound tenderness- a CT of the abdomen showed a subtle area of pancreatitis so he was referred to the ED for treatment.  He takes 2 medications that are associated with pancreatitis so have asked him to stop taking the   DPP4 inhibitor and the thiazide diuretic. -     DG Abd Acute W/Chest; Future -     Comprehensive metabolic panel; Future -     CBC with Differential/Platelet; Future -     Lipase; Future -     Amylase; Future -     Urinalysis, Routine w reflex microscopic; Future -     Sedimentation rate; Future -     CT Abdomen Pelvis W Contrast; Future  Lower abdominal pain -     CT Abdomen Pelvis W Contrast; Future   I have discontinued Nobel T. Stines's sitaGLIPtin, irbesartan-hydrochlorothiazide, Lorcaserin HCl ER, and empagliflozin. I am also having him maintain his aspirin EC, cholecalciferol, folic acid, ONETOUCH VERIO FLEX SYSTEM, glucose blood, rosuvastatin, pioglitazone, allopurinol, nitroGLYCERIN, carvedilol, albuterol, metFORMIN, and potassium chloride SA.  No orders of the defined types were placed in this encounter.    Follow-up: Return in about 1 day (around 09/16/2017).  Scarlette Calico, MD

## 2017-09-15 NOTE — Telephone Encounter (Signed)
Abdominal pain for 2 days- hurts to lay down- patient can't eat or drink without pain. Patient did go to the ED a day ago and he states he was cleared for any heart problem. Patient had nausea this morning and did vomit. Patient does state he has had some bloating and decreased urination.  Reason for Disposition . [1] MODERATE pain (e.g., interferes with normal activities) AND [2] pain comes and goes (cramps) AND [3] present > 24 hours  (Exception: pain with Vomiting or Diarrhea - see that Guideline)  Answer Assessment - Initial Assessment Questions 1. LOCATION: "Where does it hurt?"      Middle 2. RADIATION: "Does the pain shoot anywhere else?" (e.g., chest, back)     Radiates straight through to the back 3. ONSET: "When did the pain begin?" (Minutes, hours or days ago)      2 days 4. SUDDEN: "Gradual or sudden onset?"     gradual 5. PATTERN "Does the pain come and go, or is it constant?"    - If constant: "Is it getting better, staying the same, or worsening?"      (Note: Constant means the pain never goes away completely; most serious pain is constant and it progresses)     - If intermittent: "How long does it last?" "Do you have pain now?"     (Note: Intermittent means the pain goes away completely between bouts)     Comes/goes-stomach, dull pain- back 6. SEVERITY: "How bad is the pain?"  (e.g., Scale 1-10; mild, moderate, or severe)    - MILD (1-3): doesn't interfere with normal activities, abdomen soft and not tender to touch     - MODERATE (4-7): interferes with normal activities or awakens from sleep, tender to touch     - SEVERE (8-10): excruciating pain, doubled over, unable to do any normal activities       10- when comes 7. RECURRENT SYMPTOM: "Have you ever had this type of abdominal pain before?" If so, ask: "When was the last time?" and "What happened that time?"      no 8. CAUSE: "What do you think is causing the abdominal pain?"     Patient took prednisone- decreased  urination, bloated 9. RELIEVING/AGGRAVATING FACTORS: "What makes it better or worse?" (e.g., movement, antacids, bowel movement)     expelling gas helped 10. OTHER SYMPTOMS: "Has there been any vomiting, diarrhea, constipation, or urine problems?"       Vomiting- this morning- mucus ,  decreased urine output, not having BM regularly  Protocols used: ABDOMINAL PAIN - MALE-A-AH

## 2017-09-15 NOTE — Discharge Instructions (Addendum)
Please use pain medication as needed and Zofran for nausea.  Start with a clear liquid diet and advance slowly as tolerated.  Please follow-up closely with your primary care doctor.  Return to the emergency department for fevers, worsening abdominal pain persistent nausea and vomiting and you are unable to keep down fluids, blood in your stool or any other new or concerning symptoms.  Your potassium was a little bit low, this is common with vomiting, please take potassium tablets for the next 4 days.

## 2017-09-15 NOTE — ED Triage Notes (Signed)
Pt reports he had abd pain with n/v for couple days. Saw PCP today who did blood work and CT scan and was told has pancreatitis and to go to ED. Denies any urinary or diarrhea.

## 2017-09-15 NOTE — ED Notes (Signed)
Pt was given Gingerale for PO challenge. Pt tolerated well. Denies N/V.

## 2017-09-15 NOTE — Telephone Encounter (Signed)
Sierra from [facility] called report. [place] stated that the doctor was calling her back. Routing message to office.    IMPRESSION: 1. Mild fullness of the pancreatic head with associated slight haziness of the peripancreatic fat, cannot exclude a mild uncomplicated acute pancreatitis. No discrete pancreatic mass. No pancreatic or biliary ductal dilatation. No peripancreatic fluid collections. 2. No evidence of bowel obstruction or acute bowel inflammation. 3. Diffuse hepatic steatosis. 4. Mildly enlarged prostate.   Electronically Signed   By: Delbert Phenix M.D.   On: 09/15/2017 18

## 2017-09-15 NOTE — ED Provider Notes (Addendum)
[facility] DEPT Provider Note   CSN: [id number] Arrival date & time: 09/15/17  [place]     History   Chief Complaint Chief Complaint  Patient presents with  . sent due to CT scan showing pancreatitis  . Emesis    HPI [name] is a 47 y.o. male.  [name] is a 47 y.o. Male with a history of CAD, diabetes, GERD, hyperlipidemia, hypertension, sarcoidosis and arthritis, who was sent by his primary care doctor to the ED for evaluation of possible pancreatitis.  Patient reports for the past 2 days he has had periumbilical abdominal pain, nausea and vomiting and has not been able to keep down any food or fluids.  Patient reports he went to his primary care doctor today who did blood work and a CT scan which was concerning for pancreatitis and he was told to come to the emergency department.  Patient denies any fevers or chills no diarrhea, melena or hematochezia.  Patient denies any urinary symptoms.  He reports his last episode of vomiting was this morning, denies any hematemesis.  Patient has not tried to eat and drink anything today.  Patient has not tried any medications to treat his symptoms, was not prescribed anything for symptomatic management by his primary doctor.  Patient denies any chest pain or shortness of breath.     Past Medical History:  Diagnosis Date  . Anginal pain ([place])   . Arthritis    ? 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 Followed by Dr. Trudie Reed  . Arthritis    "ankles" (04/13/2016)  . Asthma   . CORONARY ARTERY DISEASE    a. cath 05/2014: mild LAD disease, normal LCx and RCA. Risk factor modification recommended.  Marland Kitchen DIABETES MELLITUS, TYPE II   . Edema   . ERECTILE DYSFUNCTION, ORGANIC   . GERD   . Gout, unspecified   . Heart murmur   . HYPERLIPIDEMIA   . HYPERTENSION   . 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 Pericarditis    recurrent  . POLYNEUROPATHY   .  PULMONARY SARCOIDOSIS    Mediastinal lymphadenopathy with biospy proven sarcodosis  . Seizures ([place])    "none in 4-5 years; don't know what kind; not related to alcohol" (04/13/2016)  . TESTICULAR HYPOFUNCTION   . TIA (transient ischemic attack)    "I don't remember when" (04/13/2016)    Patient Active Problem List   Diagnosis Date Noted  . Abdominal tenderness, generalized 09/15/2017  . Snoring 06/22/2017  . Primary narcolepsy without cataplexy 06/22/2017  . Periodic limb movements of sleep 06/22/2017  . Hypersomnia 06/22/2017  . Transient alteration of awareness 03/23/2017  . Nonintractable epilepsy with complex partial seizures ([place]) 03/23/2017  . Drug-induced erectile dysfunction 11/06/2015  . Body mass index (BMI) of 35.0 to 35.9 with comorbidity   . Sarcoidosis ([place]) 09/29/2010  . GERD 01/07/2010  . Exercise-induced asthma 12/03/2009  . POLYNEUROPATHY 10/17/2009  . Male hypogonadism 10/08/2009  . Diabetes mellitus type 2 in obese ([place]) 10/07/2009  . Hyperlipidemia with target LDL less than 70 10/07/2009  . Gout 10/07/2009  . Essential hypertension 10/07/2009  . CAD (coronary artery disease) 10/07/2009  . Male erectile dysfunction 10/07/2009    Past Surgical History:  Procedure Laterality Date  . BRONCHOSCOPY  08/21/08  . CARDIAC CATHETERIZATION  06/25/2009   minimal disease  . CARDIAC CATHETERIZATION N/A 04/14/2016   Procedure: Left Heart Cath and Coronary Angiography;  Surgeon: Harrell Gave  Santina Evans, MD;  Location: [facility];  Service: Cardiovascular;  Laterality: N/A;  . LEFT HEART CATHETERIZATION WITH CORONARY ANGIOGRAM N/A 05/18/2014   Procedure: LEFT HEART CATHETERIZATION WITH CORONARY ANGIOGRAM;  Surgeon: Jettie Booze, MD;  Location: [facility] CATH LAB;  Service: Cardiovascular;  Laterality: N/A;  . MEDIASTINOSCOPY  11/30/08        Home Medications    Prior to Admission medications   Medication Sig Start Date End Date Taking? Authorizing Provider    albuterol (VENTOLIN HFA) 108 (90 Base) MCG/ACT inhaler Inhale 2 puffs into the lungs every 6 (six) hours as needed for wheezing or shortness of breath. 07/12/17   Janith Lima, MD  allopurinol (ZYLOPRIM) 300 MG tablet TAKE 1 TABLET(300 MG) BY MOUTH DAILY 06/06/17   Janith Lima, MD  aspirin EC 81 MG tablet Take 1 tablet (81 mg total) by mouth daily. 04/29/15   Rowe Clack, MD  Blood Glucose Monitoring Suppl ([place]) w/Device KIT 1 Act by Does not apply route 3 (three) times daily with meals as needed. 04/28/16   Janith Lima, MD  canagliflozin (INVOKANA) 100 MG TABS tablet Take 1 tablet (100 mg total) by mouth daily before breakfast. 07/12/17   Renato Shin, MD  carvedilol (COREG) 3.125 MG tablet Take 1 tablet (3.125 mg total) by mouth 2 (two) times daily with a meal. 06/29/17   Janith Lima, MD  cholecalciferol (VITAMIN D) 1000 units tablet Take 1,000 Units by mouth daily.    [provider]  dapagliflozin propanediol (FARXIGA) 5 MG TABS tablet Take 5 mg by mouth daily. 07/20/17   Renato Shin, MD  folic acid (FOLVITE) 1 MG tablet TK 1 T PO QD 04/24/16   [provider]  glucose blood (ONETOUCH VERIO) test strip Use TID 05/07/16   Janith Lima, MD  irbesartan-hydrochlorothiazide (AVALIDE) 300-12.5 MG tablet Take 1 tablet by mouth daily. 06/29/17   Janith Lima, MD  metFORMIN (GLUCOPHAGE-XR) 500 MG 24 hr tablet TAKE 1 TABLET(500 MG) BY MOUTH TWICE DAILY WITH A MEAL 09/10/17   Janith Lima, MD  nitroGLYCERIN (NITROSTAT) 0.4 MG SL tablet Place 1 tablet (0.4 mg total) under the tongue every 5 (five) minutes as needed for chest pain. 06/28/17   Lyda Jester M, PA-C  pioglitazone (ACTOS) 15 MG tablet Take 1 tablet (15 mg total) by mouth daily. 03/10/17   Renato Shin, MD  potassium chloride SA (K-DUR,KLOR-CON) 20 MEQ tablet TAKE 1 TABLET(20 MEQ) BY MOUTH TWICE DAILY 09/10/17   Janith Lima, MD  rosuvastatin (CRESTOR) 40 MG tablet Take 1  tablet (40 mg total) by mouth daily. 03/01/17   Janith Lima, MD  sitaGLIPtin ([name]) 100 MG tablet Take 1 tablet (100 mg total) by mouth daily. 03/10/17   Renato Shin, MD    Family History Family History  Problem Relation Age of Onset  . Diabetes Mother   . Hypertension Mother   . Diabetes Father   . Heart disease Father 56       Fatal MI  . Heart failure Father     Social History Social History   Tobacco Use  . Smoking status: Former Smoker    Packs/day: 1.00    Years: 20.00    Pack years: 20.00    Types: Cigarettes    Last attempt to quit: 05/18/2014    Years since quitting: 3.3  . Smokeless tobacco: Never Used  Substance Use Topics  . Alcohol use: Yes  Alcohol/week: 0.0 oz    Comment: 04/13/2016 "glass of wine a couple times/month"  . Drug use: No     Allergies   Amlodipine; Sulfamethoxazole; and Sulfonamide derivatives   Review of Systems Review of Systems  Constitutional: Negative for chills and fever.  HENT: Negative for congestion, rhinorrhea and sore throat.   Eyes: Negative for visual disturbance.  Respiratory: Negative for cough and shortness of breath.   Cardiovascular: Negative for chest pain.  Gastrointestinal: Positive for abdominal pain, nausea and vomiting. Negative for blood in stool, constipation and diarrhea.  Genitourinary: Negative for dysuria, flank pain, frequency and hematuria.  Musculoskeletal: Negative for arthralgias and myalgias.  Skin: Negative for color change and rash.  Neurological: Negative for dizziness, syncope and light-headedness.     Physical Exam Updated Vital Signs BP (!) 150/108 (BP Location: Left Arm)   Pulse 91   Temp 98.2 F (36.8 C) (Oral)   Resp 18   Ht '6\' 3"'$  (1.905 m)   Wt 126.6 kg (279 lb)   SpO2 99%   BMI 34.87 kg/m   Physical Exam  Constitutional: He appears well-developed and well-nourished. No distress.  Patient appears mildly uncomfortable but is in no acute distress.  HENT:  Head:  Normocephalic and atraumatic.  Mouth/Throat: Oropharynx is clear and moist.  Eyes: Right eye exhibits no discharge. Left eye exhibits no discharge.  Neck: Neck supple.  Cardiovascular: Normal rate, regular rhythm, normal heart sounds and intact distal pulses.  Pulmonary/Chest: Effort normal and breath sounds normal. No stridor. No respiratory distress. He has no wheezes. He has no rales.  Respirations equal and unlabored, patient able to speak in full sentences, lungs clear to auscultation bilaterally  Abdominal: Soft. Bowel sounds are normal. He exhibits no distension and no mass. There is tenderness. There is guarding.  Abdomen soft, bowel sounds normal, no apparent surgical scars, there is mild periumbilical tenderness and guarding, all other quadrants nontender, negative Murphy sign, no tenderness at McBurney's point, no peritoneal signs  Musculoskeletal: He exhibits no edema or deformity.  Neurological: He is alert. Coordination normal.  Skin: Skin is warm and dry. Capillary refill takes less than 2 seconds. He is not diaphoretic.  Psychiatric: He has a normal mood and affect. His behavior is normal.  Nursing note and vitals reviewed.    ED Treatments / Results  Labs (all labs ordered are listed, but only abnormal results are displayed) Results for orders placed or performed in visit on 09/15/17 (from the past 24 hour(s))  Sedimentation rate     Status: Abnormal   Collection Time: 09/15/17  2:38 PM  Result Value Ref Range   Sed Rate 28 (H) 0 - 15 mm/hr  Urinalysis, Routine w reflex microscopic     Status: Abnormal   Collection Time: 09/15/17  2:38 PM  Result Value Ref Range   Color, Urine YELLOW Yellow;Lt. Yellow   APPearance CLEAR Clear   Specific Gravity, Urine 1.010 1.000 - 1.030   pH 8.5 (A) 5.0 - 8.0   Total Protein, Urine NEGATIVE Negative   Urine Glucose >=1000 (A) Negative   Ketones, ur NEGATIVE Negative   Bilirubin Urine NEGATIVE Negative   Hgb urine dipstick  NEGATIVE Negative   Urobilinogen, UA 0.2 0.0 - 1.0   Leukocytes, UA NEGATIVE Negative   Nitrite NEGATIVE Negative   WBC, UA none seen 0-2/hpf   RBC / HPF none seen 0-2/hpf  Amylase     Status: None   Collection Time: 09/15/17  2:38 PM  Result  Value Ref Range   Amylase 48 27 - 131 U/L  Lipase     Status: None   Collection Time: 09/15/17  2:38 PM  Result Value Ref Range   Lipase 19.0 11.0 - 59.0 U/L  CBC with Differential/Platelet     Status: Abnormal   Collection Time: 09/15/17  2:38 PM  Result Value Ref Range   WBC 12.5 (H) 4.0 - 10.5 K/uL   RBC 6.07 (H) 4.22 - 5.81 Mil/uL   Hemoglobin 16.7 13.0 - 17.0 g/dL   HCT 48.4 39.0 - 52.0 %   MCV 79.6 78.0 - 100.0 fl   MCHC 34.4 30.0 - 36.0 g/dL   RDW 14.7 11.5 - 15.5 %   Platelets 270.0 150.0 - 400.0 K/uL   Neutrophils Relative % 56.5 43.0 - 77.0 %   Lymphocytes Relative 30.4 12.0 - 46.0 %   Monocytes Relative 10.2 3.0 - 12.0 %   Eosinophils Relative 1.7 0.0 - 5.0 %   Basophils Relative 1.2 0.0 - 3.0 %   Neutro Abs 7.1 1.4 - 7.7 K/uL   Lymphs Abs 3.8 0.7 - 4.0 K/uL   Monocytes Absolute 1.3 (H) 0.1 - 1.0 K/uL   Eosinophils Absolute 0.2 0.0 - 0.7 K/uL   Basophils Absolute 0.2 (H) 0.0 - 0.1 K/uL  Comprehensive metabolic panel     Status: Abnormal   Collection Time: 09/15/17  2:38 PM  Result Value Ref Range   Sodium 134 (L) 135 - 145 mEq/L   Potassium 3.0 (L) 3.5 - 5.1 mEq/L   Chloride 91 (L) 96 - 112 mEq/L   CO2 35 (H) 19 - 32 mEq/L   Glucose, Bld 146 (H) 70 - 99 mg/dL   BUN 9 6 - 23 mg/dL   Creatinine, Ser 0.86 0.40 - 1.50 mg/dL   Total Bilirubin 1.2 0.2 - 1.2 mg/dL   Alkaline Phosphatase 81 39 - 117 U/L   AST 24 0 - 37 U/L   ALT 56 (H) 0 - 53 U/L   Total Protein 8.3 6.0 - 8.3 g/dL   Albumin 4.4 3.5 - 5.2 g/dL   Calcium 9.9 8.4 - 10.5 mg/dL   GFR 123.80 >60.00 mL/min     EKG None  Radiology Dg Chest 2 View  Result Date: 09/14/2017 CLINICAL DATA:  Left-sided chest pain. EXAM: CHEST - 2 VIEW COMPARISON:  Radiograph  05/07/2016 FINDINGS: The cardiomediastinal contours are normal. The lungs are clear. Pulmonary vasculature is normal. No consolidation, pleural effusion, or pneumothorax. No acute osseous abnormalities are seen. IMPRESSION: No acute pulmonary process. Electronically Signed   By: Jeb Levering M.D.   On: 09/14/2017 05:43   Ct Abdomen Pelvis W Contrast  Result Date: 09/15/2017 CLINICAL DATA:  Lower abdominal pain and tenderness with vomiting for 3 days. EXAM: CT ABDOMEN AND PELVIS WITH CONTRAST TECHNIQUE: Multidetector CT imaging of the abdomen and pelvis was performed using the standard protocol following bolus administration of intravenous contrast. CONTRAST:  75m ISOVUE-300 IOPAMIDOL (ISOVUE-300) INJECTION 61%, 1020mOMNIPAQUE IOHEXOL 300 MG/ML SOLN COMPARISON:  Abdominal radiographs from earlier today. 01/12/2010 CT abdomen/pelvis. FINDINGS: Lower chest: No significant pulmonary nodules or acute consolidative airspace disease. Hepatobiliary: Diffuse hepatic steatosis. No liver surface irregularity. No liver mass. Normal gallbladder with no radiopaque cholelithiasis. No biliary ductal dilatation. Pancreas: There is heterogeneous fatty infiltration of the pancreas in the pancreatic head and neck. There is mild fullness of the pancreatic head with slight haziness of the peripancreatic fat in the pancreatic head, cannot exclude mild acute pancreatitis. No  discrete pancreatic mass. No pancreatic duct dilation. No peripancreatic fluid collections. Spleen: Normal size. No mass. Adrenals/Urinary Tract: Normal adrenals. Small simple exophytic renal cysts in the lower kidneys bilaterally, largest 1.3 cm on the left. Subcentimeter hypodense renal cortical lesions in the interpolar kidneys bilaterally, too small to characterize. No hydronephrosis. Normal bladder. Stomach/Bowel: Normal non-distended stomach. Normal caliber small bowel with no small bowel wall thickening. Normal appendix. Normal large bowel with no  diverticulosis, large bowel wall thickening or pericolonic fat stranding. Vascular/Lymphatic: Normal caliber abdominal aorta. Patent portal, splenic, hepatic and renal veins. No pathologically enlarged lymph nodes in the abdomen or pelvis. Reproductive: Mildly enlarged prostate. Other: No pneumoperitoneum, ascites or focal fluid collection. Musculoskeletal: No aggressive appearing focal osseous lesions. Mild thoracolumbar spondylosis. IMPRESSION: 1. Mild fullness of the pancreatic head with associated slight haziness of the peripancreatic fat, cannot exclude a mild uncomplicated acute pancreatitis. No discrete pancreatic mass. No pancreatic or biliary ductal dilatation. No peripancreatic fluid collections. 2. No evidence of bowel obstruction or acute bowel inflammation. 3. Diffuse hepatic steatosis. 4. Mildly enlarged prostate. Electronically Signed   By: Ilona Sorrel M.D.   On: 09/15/2017 18:12   Dg Abd Acute W/chest  Result Date: 09/15/2017 CLINICAL DATA:  Generalized abd pain, N/V, constipation x 3 days intermittent, recent cp, hx of HTN, DM, Sarcoid, x-smoker. EXAM: DG ABDOMEN ACUTE W/ 1V CHEST COMPARISON:  Chest radiograph, 09/14/2017. FINDINGS: There is no evidence of dilated bowel loops or free intraperitoneal air. No radiopaque calculi or other significant radiographic abnormality is seen. Heart size and mediastinal contours are within normal limits. Both lungs are clear. IMPRESSION: Negative abdominal radiographs.  No acute cardiopulmonary disease. Electronically Signed   By: Lajean Manes M.D.   On: 09/15/2017 14:38    Procedures Procedures (including critical care time)  Medications Ordered in ED Medications  ondansetron (ZOFRAN-ODT) disintegrating tablet 4 mg (4 mg Oral Given 09/15/17 1905)  sodium chloride 0.9 % bolus 1,000 mL (1,000 mLs Intravenous New Bag/Given 09/15/17 2115)  morphine 4 MG/ML injection 4 mg (4 mg Intravenous Given 09/15/17 2119)  morphine 4 MG/ML injection 4 mg (4 mg  Intravenous Given 09/15/17 2257)     Initial Impression / Assessment and Plan / ED Course  I have reviewed the triage vital signs and the nursing notes.  Pertinent labs & imaging results that were available during my care of the patient were reviewed by me and considered in my medical decision making (see chart for details).  Patient presents to the ED for evaluation of periumbilical abdominal pain, nausea and vomiting.  Had a workup done through his primary care doctor today.  Abdominal CT scan showed mild pancreatitis.  Lipase and amylase normal.  CBC shows mild leukocytosis of 12.5, normal hemoglobin, potassium is 3.0, will give 40 of p.o. potassium here in the ED and have patient continue taking potassium for the next few days.  Analysis without evidence of infection.  Based on CT and lab results this appears to be a very mild case of pancreatitis.  We will focus on symptomatic treatment with IV fluids, pain medication and Zofran and reevaluate the patient.  Patient tolerating p.o. ginger ale with no further episodes of nausea or vomiting.  Reports some improvement in pain, 1 additional dose of pain medication given with continued improvement.  At this time I feel patient is stable for discharge home with pain medication and Zofran, counseled to use clear liquid diet and advance slowly as tolerated.  Patient provided with 4 more  days of potassium tablets.  He should follow-up with his primary care doctor in the next few days for recheck.  Strict return precautions discussed.  Patient expressed understanding and is in agreement with plan.  Final Clinical Impressions(s) / ED Diagnoses   Final diagnoses:  Acute pancreatitis without infection or necrosis, unspecified pancreatitis type  Non-intractable vomiting with nausea, unspecified vomiting type  Hypokalemia    ED Discharge Orders        Ordered    potassium chloride (K-DUR) 10 MEQ tablet  Daily     09/16/17 0003    ondansetron (ZOFRAN  ODT) 4 MG disintegrating tablet     09/15/17 2352    oxyCODONE-acetaminophen (PERCOCET/ROXICET) 5-325 MG tablet  Every 4 hours PRN     09/15/17 2352           Jacqlyn Larsen, PA-C 09/16/17 0037    Fatima Blank, MD 09/16/17 0041

## 2017-09-15 NOTE — Patient Instructions (Addendum)

## 2017-09-16 MED ORDER — POTASSIUM CHLORIDE ER 10 MEQ PO TBCR: 10.0000 meq | EXTENDED_RELEASE_TABLET | Freq: Every day | ORAL | 0 refills | Status: DC

## 2017-09-17 ENCOUNTER — Encounter: Payer: Self-pay | Admitting: Family

## 2017-09-17 ENCOUNTER — Ambulatory Visit: Payer: Medicare Other | Admitting: Family

## 2017-09-17 ENCOUNTER — Other Ambulatory Visit (INDEPENDENT_AMBULATORY_CARE_PROVIDER_SITE_OTHER): Payer: Self-pay

## 2017-09-17 ENCOUNTER — Encounter: Payer: Self-pay | Admitting: Gastroenterology

## 2017-09-17 VITALS — BP 132/90 | HR 88 | Temp 98.2°F | Ht 75.0 in | Wt 280.0 lb

## 2017-09-17 DIAGNOSIS — K859 Acute pancreatitis without necrosis or infection, unspecified: Secondary | ICD-10-CM

## 2017-09-17 LAB — CBC WITH DIFFERENTIAL/PLATELET
Basophils Absolute: 0.1 10*3/uL (ref 0.0–0.1)
Basophils Relative: 2 % (ref 0.0–3.0)
Eosinophils Absolute: 0.3 10*3/uL (ref 0.0–0.7)
Eosinophils Relative: 4.2 % (ref 0.0–5.0)
HCT: 44.9 % (ref 39.0–52.0)
Hemoglobin: 15.5 g/dL (ref 13.0–17.0)
Lymphocytes Relative: 42.5 % (ref 12.0–46.0)
Lymphs Abs: 3 10*3/uL (ref 0.7–4.0)
MCHC: 34.5 g/dL (ref 30.0–36.0)
MCV: 80.1 fl (ref 78.0–100.0)
Monocytes Absolute: 0.7 10*3/uL (ref 0.1–1.0)
Monocytes Relative: 9.8 % (ref 3.0–12.0)
Neutro Abs: 2.9 10*3/uL (ref 1.4–7.7)
Neutrophils Relative %: 41.5 % — ABNORMAL LOW (ref 43.0–77.0)
Platelets: 282 10*3/uL (ref 150.0–400.0)
RBC: 5.61 Mil/uL (ref 4.22–5.81)
RDW: 14.9 % (ref 11.5–15.5)
WBC: 7.1 10*3/uL (ref 4.0–10.5)

## 2017-09-17 LAB — COMPREHENSIVE METABOLIC PANEL
ALT: 45 U/L (ref 0–53)
AST: 23 U/L (ref 0–37)
Albumin: 4 g/dL (ref 3.5–5.2)
Alkaline Phosphatase: 71 U/L (ref 39–117)
BUN: 9 mg/dL (ref 6–23)
CO2: 31 mEq/L (ref 19–32)
Calcium: 9.4 mg/dL (ref 8.4–10.5)
Chloride: 94 mEq/L — ABNORMAL LOW (ref 96–112)
Creatinine, Ser: 0.96 mg/dL (ref 0.40–1.50)
GFR: 109.04 mL/min (ref >60.00)
Glucose, Bld: 129 mg/dL — ABNORMAL HIGH (ref 70–99)
Potassium: 4 mEq/L (ref 3.5–5.1)
Sodium: 135 mEq/L (ref 135–145)
Total Bilirubin: 1 mg/dL (ref 0.2–1.2)
Total Protein: 7.6 g/dL (ref 6.0–8.3)

## 2017-09-17 LAB — LIPASE: Lipase: 23 U/L (ref 11.0–59.0)

## 2017-09-17 LAB — AMYLASE: Amylase: 52 U/L (ref 27–131)

## 2017-09-17 NOTE — Patient Instructions (Signed)
Acute Pancreatitis Acute pancreatitis is a condition in which the pancreas suddenly gets irritated and swollen (has inflammation). The pancreas is a large gland behind the stomach. It makes enzymes that help to digest food. The pancreas also makes hormones that help to control your blood sugar. Acute pancreatitis happens when the enzymes attack the pancreas and damage it. Most attacks last a couple of days and can cause serious problems. Follow these instructions at home: Eating and drinking  Follow instructions from your doctor about diet. You may need to: ? Avoid alcohol. ? Limit how much fat is in your diet.  Eat small meals often. Avoid eating big meals.  Drink enough fluid to keep your pee (urine) clear or pale yellow.  Do not drink alcohol if it caused your condition. General instructions  Take over-the-counter and prescription medicines only as told by your doctor.  Do not use any tobacco products. These include cigarettes, chewing tobacco, and e-cigarettes. If you need help quitting, ask your doctor.  Get plenty of rest.  If directed, check your blood sugar at home as told by your doctor.  Keep all follow-up visits as told by your doctor. This is important. Contact a doctor if:  You do not get better as quickly as expected.  You have new symptoms.  Your symptoms get worse.  You have lasting pain or weakness.  You continue to feel sick to your stomach (nauseous).  You get better and then you have another pain attack.  You have a fever. Get help right away if:  You cannot eat or keep fluids down.  Your pain becomes very bad.  Your skin or the white part of your eyes turns yellow (jaundice).  You throw up (vomit).  You feel dizzy or you pass out (faint).  Your blood sugar is high (over 300 mg/dL). This information is not intended to replace advice given to you by your health care provider. Make sure you discuss any questions you have with your health care  provider. Document Released: 11/11/2007 Document Revised: 10/31/2015 Document Reviewed: 02/26/2015 Elsevier Interactive Patient Education  2018 [employer]  

## 2017-09-17 NOTE — Progress Notes (Signed)
[name] is a 47 y.o. male with the following history as recorded in EpicCare:  Patient Active Problem List   Diagnosis Date Noted  . Abdominal tenderness, generalized 09/15/2017  . Snoring 06/22/2017  . Primary narcolepsy without cataplexy 06/22/2017  . Periodic limb movements of sleep 06/22/2017  . Hypersomnia 06/22/2017  . Transient alteration of awareness 03/23/2017  . Nonintractable epilepsy with complex partial seizures ([place]) 03/23/2017  . Drug-induced erectile dysfunction 11/06/2015  . Body mass index (BMI) of 35.0 to 35.9 with comorbidity   . Sarcoidosis ([place]) 09/29/2010  . GERD 01/07/2010  . Exercise-induced asthma 12/03/2009  . POLYNEUROPATHY 10/17/2009  . Male hypogonadism 10/08/2009  . Diabetes mellitus type 2 in obese ([place]) 10/07/2009  . Hyperlipidemia with target LDL less than 70 10/07/2009  . Gout 10/07/2009  . Essential hypertension 10/07/2009  . CAD (coronary artery disease) 10/07/2009  . Male erectile dysfunction 10/07/2009    Current Outpatient Medications  Medication Sig Dispense Refill  . albuterol (VENTOLIN HFA) 108 (90 Base) MCG/ACT inhaler Inhale 2 puffs into the lungs every 6 (six) hours as needed for wheezing or shortness of breath. 3 Inhaler 1  . allopurinol (ZYLOPRIM) 300 MG tablet TAKE 1 TABLET(300 MG) BY MOUTH DAILY 90 tablet 0  . aspirin EC 81 MG tablet Take 1 tablet (81 mg total) by mouth daily. 30 tablet 3  . Blood Glucose Monitoring Suppl (ONETOUCH VERIO FLEX SYSTEM) w/Device KIT 1 Act by Does not apply route 3 (three) times daily with meals as needed. 2 kit 0  . carvedilol (COREG) 3.125 MG tablet Take 1 tablet (3.125 mg total) by mouth 2 (two) times daily with a meal. 180 tablet 0  . cholecalciferol (VITAMIN D) 1000 units tablet Take 1,000 Units by mouth daily.    . folic acid (FOLVITE) 1 MG tablet TK 1 T PO QD  1  . glucose blood (ONETOUCH VERIO) test strip Use TID 100 each 11  . irbesartan-hydrochlorothiazide (AVALIDE) 300-12.5 MG  tablet Take 1 tablet by mouth daily. 90 tablet 1  . metFORMIN (GLUCOPHAGE-XR) 500 MG 24 hr tablet TAKE 1 TABLET(500 MG) BY MOUTH TWICE DAILY WITH A MEAL 180 tablet 0  . nitroGLYCERIN (NITROSTAT) 0.4 MG SL tablet Place 1 tablet (0.4 mg total) under the tongue every 5 (five) minutes as needed for chest pain. 25 tablet 3  . ondansetron (ZOFRAN ODT) 4 MG disintegrating tablet 59m ODT q4 hours prn nausea/vomit 10 tablet 0  . oxyCODONE-acetaminophen (PERCOCET/ROXICET) 5-325 MG tablet Take 1-2 tablets by mouth every 4 (four) hours as needed for severe pain. 15 tablet 0  . pioglitazone (ACTOS) 15 MG tablet Take 1 tablet (15 mg total) by mouth daily. 30 tablet 11  . potassium chloride (K-DUR) 10 MEQ tablet Take 1 tablet (10 mEq total) by mouth daily for 4 days. 4 tablet 0  . potassium chloride SA (K-DUR,KLOR-CON) 20 MEQ tablet TAKE 1 TABLET(20 MEQ) BY MOUTH TWICE DAILY 180 tablet 0  . rosuvastatin (CRESTOR) 40 MG tablet Take 1 tablet (40 mg total) by mouth daily. 90 tablet 1  . sitaGLIPtin ([name]) 100 MG tablet Take 1 tablet (100 mg total) by mouth daily. 30 tablet 11   No current facility-administered medications for this visit.     Allergies: Amlodipine; Sulfamethoxazole; and Sulfonamide derivatives  Past Medical History:  Diagnosis Date  . Anginal pain ([place]   . Arthritis    ? 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 Followed by Dr. HTrudie Reed . Arthritis    "ankles" (04/13/2016)  .  Asthma   . CORONARY ARTERY DISEASE    a. cath 05/2014: mild LAD disease, normal LCx and RCA. Risk factor modification recommended.  Marland Kitchen DIABETES MELLITUS, TYPE II   . Edema   . ERECTILE DYSFUNCTION, ORGANIC   . GERD   . Gout, unspecified   . Heart murmur   . HYPERLIPIDEMIA   . HYPERTENSION   . 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 Pericarditis    recurrent  . POLYNEUROPATHY   . PULMONARY SARCOIDOSIS    Mediastinal lymphadenopathy with biospy proven sarcodosis  .  Seizures ([place])    "none in 4-5 years; don't know what kind; not related to alcohol" (04/13/2016)  . TESTICULAR HYPOFUNCTION   . TIA (transient ischemic attack)    "I don't remember when" (04/13/2016)    Past Surgical History:  Procedure Laterality Date  . BRONCHOSCOPY  08/21/08  . CARDIAC CATHETERIZATION  06/25/2009   minimal disease  . CARDIAC CATHETERIZATION N/A 04/14/2016   Procedure: Left Heart Cath and Coronary Angiography;  Surgeon: Burnell Blanks, MD;  Location: [facility];  Service: Cardiovascular;  Laterality: N/A;  . LEFT HEART CATHETERIZATION WITH CORONARY ANGIOGRAM N/A 05/18/2014   Procedure: LEFT HEART CATHETERIZATION WITH CORONARY ANGIOGRAM;  Surgeon: Jettie Booze, MD;  Location[facility];  Service: Cardiovascular;  Laterality: N/A;  . MEDIASTINOSCOPY  11/30/08    Family History  Problem Relation Age of Onset  . Diabetes Mother   . Hypertension Mother   . Diabetes Father   . Heart disease Father 70       Fatal MI  . Heart failure Father     Social History   Tobacco Use  . Smoking status: Former Smoker    Packs/day: 1.00    Years: 20.00    Pack years: 20.00    Types: Cigarettes    Last attempt to quit: 05/18/2014    Years since quitting: 3.3  . Smokeless tobacco: Never Used  Substance Use Topics  . Alcohol use: Yes    Alcohol/week: 0.0 oz    Comment: 04/13/2016 "glass of wine a couple times/month"    Subjective:  Patient started suddenly on Monday night with abdominal pain; went to ER but wait was too long; symptoms worsened over the next 2 days; saw his PCP on Wednesday and CT showed mild pancreatitis; was sent to ER and treated with IV fluids, anti-nausea medication; notes he had not had any alcohol or Tylenol prior to onset of symptoms; no known gallbladder disease; does have "fatty pancreas" per his endocrinologist- was supposed to be taking Actos to help treat but admits he had not been taking this medication recently;  Is feeling  somewhat better today; was able to eat soup yesterday; no further vomiting since being seen at ER on Wednesday night; no fever; needs work note asking for him to return to work on Monday;      Objective:  Vitals:   09/17/17 1044  BP: 132/90  Pulse: 88  Temp: 98.2 F (36.8 C)  TempSrc: Oral  SpO2: 96%  Weight: 280 lb 0.6 oz (127 kg)  Height: _0  (1.905 m)    General: Well developed, well nourished, in no acute distress  Skin : Warm and dry.  Lungs: Respirations unlabored; clear to auscultation bilaterally without wheeze, rales, rhonchi  CVS exam: normal rate and regular rhythm.  Abdomen: Soft; tender over LUQ;  nondistended; normoactive bowel sounds; no masses or hepatosplenomegaly  Musculoskeletal:  No deformities; no active joint inflammation  Extremities: No edema, cyanosis, clubbing  Vessels: Symmetric bilaterally  Neurologic: Alert and oriented; speech intact; face symmetrical; moves all extremities well; CNII-XII intact without focal deficit  Assessment:  1. Acute pancreatitis, unspecified complication status, unspecified pancreatitis type     Plan:   Will repeat labs today including CBC, CMP, amylase, lipase; work note given as requested; assuming his labs are normal today, will consider HIDA scan to better evaluate his gallbladder function- anatomy of gallbladder was normal on recent CT; refer to GI for further evaluation; Encouraged to take his medications for his diabetes as scheduled including the Actos;   No follow-ups on file.  Orders Placed This Encounter  Procedures  . CBC w/Diff    Standing Status:   Future    Number of Occurrences:   1    Standing Expiration Date:   09/17/2018  . Comp Met (CMET)    Standing Status:   Future    Number of Occurrences:   1    Standing Expiration Date:   09/17/2018  . Amylase    Standing Status:   Future    Number of Occurrences:   1    Standing Expiration Date:   09/17/2018  . Lipase    Standing Status:   Future    Number  of Occurrences:   1    Standing Expiration Date:   09/17/2018  . Ambulatory referral to Gastroenterology    Referral Priority:   Routine    Referral Type:   Consultation    Referral Reason:   Specialty Services Required    Number of Visits Requested:   1    Requested Prescriptions    No prescriptions requested or ordered in this encounter

## 2017-09-18 DIAGNOSIS — K859 Acute pancreatitis without necrosis or infection, unspecified: Secondary | ICD-10-CM | POA: Insufficient documentation

## 2017-09-18 DIAGNOSIS — R1030 Lower abdominal pain, unspecified: Secondary | ICD-10-CM | POA: Insufficient documentation

## 2017-09-19 ENCOUNTER — Encounter (HOSPITAL_BASED_OUTPATIENT_CLINIC_OR_DEPARTMENT_OTHER): Payer: Self-pay | Admitting: *Deleted

## 2017-09-19 ENCOUNTER — Other Ambulatory Visit: Payer: Self-pay

## 2017-09-19 ENCOUNTER — Other Ambulatory Visit (HOSPITAL_COMMUNITY): Payer: Self-pay

## 2017-09-19 ENCOUNTER — Observation Stay (HOSPITAL_COMMUNITY): Payer: Medicare Other

## 2017-09-19 ENCOUNTER — Inpatient Hospital Stay (HOSPITAL_BASED_OUTPATIENT_CLINIC_OR_DEPARTMENT_OTHER)
Admission: EM | Admit: 2017-09-19 | Discharge: 2017-09-22 | DRG: 418 | Disposition: A | Discharge status: Home / Self Care | Payer: Medicare Other | Attending: Internal Medicine | Admitting: Internal Medicine

## 2017-09-19 DIAGNOSIS — N529 Male erectile dysfunction, unspecified: Secondary | ICD-10-CM | POA: Diagnosis present

## 2017-09-19 DIAGNOSIS — Z7982 Long term (current) use of aspirin: Secondary | ICD-10-CM | POA: Diagnosis not present

## 2017-09-19 DIAGNOSIS — Z8673 Personal history of transient ischemic attack (TIA), and cerebral infarction without residual deficits: Secondary | ICD-10-CM

## 2017-09-19 DIAGNOSIS — M109 Gout, unspecified: Secondary | ICD-10-CM | POA: Diagnosis not present

## 2017-09-19 DIAGNOSIS — R1011 Right upper quadrant pain: Secondary | ICD-10-CM | POA: Diagnosis not present

## 2017-09-19 DIAGNOSIS — J45909 Unspecified asthma, uncomplicated: Secondary | ICD-10-CM | POA: Diagnosis not present

## 2017-09-19 DIAGNOSIS — K861 Other chronic pancreatitis: Secondary | ICD-10-CM | POA: Diagnosis not present

## 2017-09-19 DIAGNOSIS — E1165 Type 2 diabetes mellitus with hyperglycemia: Secondary | ICD-10-CM | POA: Diagnosis present

## 2017-09-19 DIAGNOSIS — I2510 Atherosclerotic heart disease of native coronary artery without angina pectoris: Secondary | ICD-10-CM | POA: Diagnosis not present

## 2017-09-19 DIAGNOSIS — Z833 Family history of diabetes mellitus: Secondary | ICD-10-CM

## 2017-09-19 DIAGNOSIS — R0602 Shortness of breath: Secondary | ICD-10-CM

## 2017-09-19 DIAGNOSIS — D869 Sarcoidosis, unspecified: Secondary | ICD-10-CM | POA: Diagnosis present

## 2017-09-19 DIAGNOSIS — I10 Essential (primary) hypertension: Secondary | ICD-10-CM | POA: Diagnosis not present

## 2017-09-19 DIAGNOSIS — K760 Fatty (change of) liver, not elsewhere classified: Secondary | ICD-10-CM | POA: Diagnosis present

## 2017-09-19 DIAGNOSIS — D860 Sarcoidosis of lung: Secondary | ICD-10-CM | POA: Diagnosis present

## 2017-09-19 DIAGNOSIS — E1142 Type 2 diabetes mellitus with diabetic polyneuropathy: Secondary | ICD-10-CM | POA: Diagnosis present

## 2017-09-19 DIAGNOSIS — K8010 Calculus of gallbladder with chronic cholecystitis without obstruction: Secondary | ICD-10-CM | POA: Diagnosis not present

## 2017-09-19 DIAGNOSIS — D8689 Sarcoidosis of other sites: Secondary | ICD-10-CM | POA: Diagnosis present

## 2017-09-19 DIAGNOSIS — K8020 Calculus of gallbladder without cholecystitis without obstruction: Secondary | ICD-10-CM | POA: Diagnosis not present

## 2017-09-19 DIAGNOSIS — N62 Hypertrophy of breast: Secondary | ICD-10-CM | POA: Diagnosis not present

## 2017-09-19 DIAGNOSIS — R109 Unspecified abdominal pain: Secondary | ICD-10-CM | POA: Diagnosis not present

## 2017-09-19 DIAGNOSIS — G40909 Epilepsy, unspecified, not intractable, without status epilepticus: Secondary | ICD-10-CM | POA: Diagnosis not present

## 2017-09-19 DIAGNOSIS — Z7984 Long term (current) use of oral hypoglycemic drugs: Secondary | ICD-10-CM | POA: Diagnosis not present

## 2017-09-19 DIAGNOSIS — E119 Type 2 diabetes mellitus without complications: Secondary | ICD-10-CM | POA: Diagnosis not present

## 2017-09-19 DIAGNOSIS — E1169 Type 2 diabetes mellitus with other specified complication: Secondary | ICD-10-CM | POA: Diagnosis present

## 2017-09-19 DIAGNOSIS — Z8249 Family history of ischemic heart disease and other diseases of the circulatory system: Secondary | ICD-10-CM

## 2017-09-19 DIAGNOSIS — Z79899 Other long term (current) drug therapy: Secondary | ICD-10-CM | POA: Diagnosis not present

## 2017-09-19 DIAGNOSIS — E669 Obesity, unspecified: Secondary | ICD-10-CM | POA: Diagnosis present

## 2017-09-19 DIAGNOSIS — Z79811 Long term (current) use of aromatase inhibitors: Secondary | ICD-10-CM | POA: Diagnosis not present

## 2017-09-19 DIAGNOSIS — Z87891 Personal history of nicotine dependence: Secondary | ICD-10-CM | POA: Diagnosis not present

## 2017-09-19 DIAGNOSIS — E785 Hyperlipidemia, unspecified: Secondary | ICD-10-CM | POA: Diagnosis present

## 2017-09-19 DIAGNOSIS — G47419 Narcolepsy without cataplexy: Secondary | ICD-10-CM | POA: Diagnosis present

## 2017-09-19 DIAGNOSIS — Z882 Allergy status to sulfonamides status: Secondary | ICD-10-CM

## 2017-09-19 DIAGNOSIS — K219 Gastro-esophageal reflux disease without esophagitis: Secondary | ICD-10-CM | POA: Diagnosis present

## 2017-09-19 DIAGNOSIS — R52 Pain, unspecified: Secondary | ICD-10-CM

## 2017-09-19 DIAGNOSIS — K859 Acute pancreatitis without necrosis or infection, unspecified: Secondary | ICD-10-CM | POA: Diagnosis not present

## 2017-09-19 DIAGNOSIS — Z6835 Body mass index (BMI) 35.0-35.9, adult: Secondary | ICD-10-CM | POA: Diagnosis not present

## 2017-09-19 DIAGNOSIS — Z888 Allergy status to other drugs, medicaments and biological substances status: Secondary | ICD-10-CM

## 2017-09-19 DIAGNOSIS — R112 Nausea with vomiting, unspecified: Secondary | ICD-10-CM | POA: Diagnosis not present

## 2017-09-19 LAB — COMPREHENSIVE METABOLIC PANEL
ALT: 45 U/L (ref 17–63)
AST: 30 U/L (ref 15–41)
Albumin: 3.9 g/dL (ref 3.5–5.0)
Alkaline Phosphatase: 67 U/L (ref 38–126)
Anion gap: 10 (ref 5–15)
BUN: 8 mg/dL (ref 6–20)
CO2: 26 mmol/L (ref 22–32)
Calcium: 9.1 mg/dL (ref 8.9–10.3)
Chloride: 99 mmol/L — ABNORMAL LOW (ref 101–111)
Creatinine, Ser: 0.97 mg/dL (ref 0.61–1.24)
GFR calc Af Amer: >60 mL/min (ref >60)
GFR calc non Af Amer: >60 mL/min (ref >60)
Glucose, Bld: 144 mg/dL — ABNORMAL HIGH (ref 65–99)
Potassium: 4.4 mmol/L (ref 3.5–5.1)
Sodium: 135 mmol/L (ref 135–145)
Total Bilirubin: 0.3 mg/dL (ref 0.3–1.2)
Total Protein: 7.5 g/dL (ref 6.5–8.1)

## 2017-09-19 LAB — CBC WITH DIFFERENTIAL/PLATELET
Basophils Absolute: 0.1 10*3/uL (ref 0.0–0.1)
Basophils Relative: 1 %
Eosinophils Absolute: 0.2 10*3/uL (ref 0.0–0.7)
Eosinophils Relative: 4 %
HCT: 43.1 % (ref 39.0–52.0)
Hemoglobin: 15.3 g/dL (ref 13.0–17.0)
Lymphocytes Relative: 47 %
Lymphs Abs: 2.8 10*3/uL (ref 0.7–4.0)
MCH: 27.4 pg (ref 26.0–34.0)
MCHC: 35.5 g/dL (ref 30.0–36.0)
MCV: 77.1 fL — ABNORMAL LOW (ref 78.0–100.0)
Monocytes Absolute: 0.8 10*3/uL (ref 0.1–1.0)
Monocytes Relative: 14 %
Neutro Abs: 2 10*3/uL (ref 1.7–7.7)
Neutrophils Relative %: 34 %
Platelets: 228 10*3/uL (ref 150–400)
RBC: 5.59 MIL/uL (ref 4.22–5.81)
RDW: 14.4 % (ref 11.5–15.5)
WBC: 5.9 10*3/uL (ref 4.0–10.5)

## 2017-09-19 LAB — LIPID PANEL
Cholesterol: 176 mg/dL (ref 0–200)
HDL: 34 mg/dL — ABNORMAL LOW (ref >40)
LDL Cholesterol: 123 mg/dL — ABNORMAL HIGH (ref 0–99)
Total CHOL/HDL Ratio: 5.2 RATIO
Triglycerides: 97 mg/dL (ref <150)
VLDL: 19 mg/dL (ref 0–40)

## 2017-09-19 LAB — LIPASE, BLOOD: Lipase: 52 U/L — ABNORMAL HIGH (ref 11–51)

## 2017-09-19 LAB — URINALYSIS, ROUTINE W REFLEX MICROSCOPIC
Bilirubin Urine: NEGATIVE
Glucose, UA: >=500 mg/dL — AB
Hgb urine dipstick: NEGATIVE
Ketones, ur: NEGATIVE mg/dL
Leukocytes, UA: NEGATIVE
Nitrite: NEGATIVE
Protein, ur: NEGATIVE mg/dL
Specific Gravity, Urine: 1.01 (ref 1.005–1.030)
pH: 6.5 (ref 5.0–8.0)

## 2017-09-19 LAB — URINALYSIS, MICROSCOPIC (REFLEX): Bacteria, UA: NONE SEEN

## 2017-09-19 LAB — CREATININE, SERUM
Creatinine, Ser: 0.84 mg/dL (ref 0.61–1.24)
GFR calc Af Amer: >60 mL/min (ref >60)
GFR calc non Af Amer: >60 mL/min (ref >60)

## 2017-09-19 LAB — CBC
HCT: 43 % (ref 39.0–52.0)
Hemoglobin: 15.1 g/dL (ref 13.0–17.0)
MCH: 27.9 pg (ref 26.0–34.0)
MCHC: 35.1 g/dL (ref 30.0–36.0)
MCV: 79.5 fL (ref 78.0–100.0)
Platelets: 254 10*3/uL (ref 150–400)
RBC: 5.41 MIL/uL (ref 4.22–5.81)
RDW: 14.3 % (ref 11.5–15.5)
WBC: 6.5 10*3/uL (ref 4.0–10.5)

## 2017-09-19 LAB — TROPONIN I
Troponin I: <0.03 ng/mL (ref <0.03)
Troponin I: <0.03 ng/mL (ref <0.03)
Troponin I: <0.03 ng/mL (ref <0.03)

## 2017-09-19 LAB — GLUCOSE, CAPILLARY
Glucose-Capillary: 113 mg/dL — ABNORMAL HIGH (ref 65–99)
Glucose-Capillary: 113 mg/dL — ABNORMAL HIGH (ref 65–99)
Glucose-Capillary: 104 mg/dL — ABNORMAL HIGH (ref 65–99)

## 2017-09-19 MED ORDER — HYDROMORPHONE HCL 1 MG/ML IJ SOLN
0.5000 mg | INTRAMUSCULAR | Status: DC | PRN
  Administered 2017-09-19 – 2017-09-21 (×7): 0.5 mg via INTRAVENOUS
  Filled 2017-09-19 (×7): qty 0.5

## 2017-09-19 MED ORDER — ONDANSETRON HCL 4 MG/2ML IJ SOLN
4.0000 mg | Freq: Four times a day (QID) | INTRAMUSCULAR | Status: DC | PRN
  Administered 2017-09-21: 4 mg via INTRAVENOUS

## 2017-09-19 MED ORDER — INSULIN ASPART 100 UNIT/ML ~~LOC~~ SOLN
0.0000 [IU] | Freq: Every day | SUBCUTANEOUS | Status: DC
  Administered 2017-09-21: 2 [IU] via SUBCUTANEOUS

## 2017-09-19 MED ORDER — CARVEDILOL 3.125 MG PO TABS
3.1250 mg | ORAL_TABLET | Freq: Two times a day (BID) | ORAL | Status: DC
  Administered 2017-09-19 – 2017-09-22 (×5): 3.125 mg via ORAL
  Filled 2017-09-19 (×5): qty 1

## 2017-09-19 MED ORDER — ANASTROZOLE 1 MG PO TABS
1.0000 mg | ORAL_TABLET | Freq: Every day | ORAL | Status: DC
  Administered 2017-09-19 – 2017-09-22 (×2): 1 mg via ORAL
  Filled 2017-09-19 (×4): qty 1

## 2017-09-19 MED ORDER — SAW PALMETTO (SERENOA REPENS) 160 MG PO CAPS: 160.0000 mg | ORAL_CAPSULE | Freq: Every day | ORAL | Status: DC

## 2017-09-19 MED ORDER — ALLOPURINOL 300 MG PO TABS
300.0000 mg | ORAL_TABLET | Freq: Every day | ORAL | Status: DC
  Administered 2017-09-19 – 2017-09-22 (×2): 300 mg via ORAL
  Filled 2017-09-19 (×2): qty 1

## 2017-09-19 MED ORDER — SODIUM CHLORIDE 0.9 % IV BOLUS
1000.0000 mL | Freq: Once | INTRAVENOUS | Status: AC
  Administered 2017-09-19: 1000 mL via INTRAVENOUS

## 2017-09-19 MED ORDER — CYANOCOBALAMIN 500 MCG PO TABS
250.0000 ug | ORAL_TABLET | Freq: Every day | ORAL | Status: DC
  Administered 2017-09-19 – 2017-09-22 (×2): 250 ug via ORAL
  Filled 2017-09-19 (×4): qty 1

## 2017-09-19 MED ORDER — MORPHINE SULFATE (PF) 4 MG/ML IV SOLN
4.0000 mg | Freq: Once | INTRAVENOUS | Status: AC
  Administered 2017-09-19: 4 mg via INTRAVENOUS
  Filled 2017-09-19: qty 1

## 2017-09-19 MED ORDER — ENOXAPARIN SODIUM 60 MG/0.6ML ~~LOC~~ SOLN
60.0000 mg | SUBCUTANEOUS | Status: DC
  Administered 2017-09-19 – 2017-09-22 (×2): 60 mg via SUBCUTANEOUS
  Filled 2017-09-19 (×2): qty 0.6

## 2017-09-19 MED ORDER — ACETAMINOPHEN 650 MG RE SUPP: 650.0000 mg | Freq: Four times a day (QID) | RECTAL | Status: DC | PRN

## 2017-09-19 MED ORDER — FOLIC ACID 1 MG PO TABS
1.0000 mg | ORAL_TABLET | Freq: Every day | ORAL | Status: DC
Start: 2017-09-19 — End: 2017-09-22
  Administered 2017-09-19 – 2017-09-22 (×2): 1 mg via ORAL
  Filled 2017-09-19 (×2): qty 1

## 2017-09-19 MED ORDER — ACETAMINOPHEN 325 MG PO TABS
650.0000 mg | ORAL_TABLET | Freq: Four times a day (QID) | ORAL | Status: DC | PRN
Start: 2017-09-19 — End: 2017-09-22

## 2017-09-19 MED ORDER — ASPIRIN EC 81 MG PO TBEC
81.0000 mg | DELAYED_RELEASE_TABLET | Freq: Every day | ORAL | Status: DC
  Administered 2017-09-22: 81 mg via ORAL
  Filled 2017-09-19 (×2): qty 1

## 2017-09-19 MED ORDER — SODIUM CHLORIDE 0.9 % IV SOLN
INTRAVENOUS | Status: DC
  Administered 2017-09-19: 07:00:00 via INTRAVENOUS

## 2017-09-19 MED ORDER — ASPIRIN 325 MG PO TABS
325.0000 mg | ORAL_TABLET | Freq: Once | ORAL | Status: AC
  Administered 2017-09-19: 325 mg via ORAL
  Filled 2017-09-19: qty 1

## 2017-09-19 MED ORDER — CARVEDILOL 3.125 MG PO TABS
3.1250 mg | ORAL_TABLET | Freq: Once | ORAL | Status: AC
  Administered 2017-09-19: 3.125 mg via ORAL
  Filled 2017-09-19: qty 1

## 2017-09-19 MED ORDER — LEVETIRACETAM 500 MG PO TABS
1000.0000 mg | ORAL_TABLET | Freq: Two times a day (BID) | ORAL | Status: DC
  Administered 2017-09-19 – 2017-09-22 (×5): 1000 mg via ORAL
  Filled 2017-09-19 (×4): qty 2

## 2017-09-19 MED ORDER — LACTATED RINGERS IV SOLN
INTRAVENOUS | Status: DC
  Administered 2017-09-19 – 2017-09-21 (×7): via INTRAVENOUS
  Administered 2017-09-21: 1000 mL via INTRAVENOUS
  Administered 2017-09-22: 05:00:00 via INTRAVENOUS
  Filled 2017-09-19 (×2): qty 1000

## 2017-09-19 MED ORDER — INSULIN ASPART 100 UNIT/ML ~~LOC~~ SOLN
0.0000 [IU] | Freq: Three times a day (TID) | SUBCUTANEOUS | Status: DC
Start: 2017-09-19 — End: 2017-09-22
  Administered 2017-09-21 – 2017-09-22 (×2): 2 [IU] via SUBCUTANEOUS

## 2017-09-19 MED ORDER — ONDANSETRON HCL 4 MG PO TABS: 4.0000 mg | ORAL_TABLET | Freq: Four times a day (QID) | ORAL | Status: DC | PRN

## 2017-09-19 MED ORDER — ROSUVASTATIN CALCIUM 20 MG PO TABS
40.0000 mg | ORAL_TABLET | Freq: Every day | ORAL | Status: DC
  Administered 2017-09-19 – 2017-09-22 (×2): 40 mg via ORAL
  Filled 2017-09-19 (×2): qty 2

## 2017-09-19 MED ORDER — ALBUTEROL SULFATE (2.5 MG/3ML) 0.083% IN NEBU: 2.5000 mg | INHALATION_SOLUTION | Freq: Four times a day (QID) | RESPIRATORY_TRACT | Status: DC | PRN

## 2017-09-19 MED ORDER — OXYCODONE HCL 5 MG PO TABS
5.0000 mg | ORAL_TABLET | ORAL | Status: DC | PRN
  Administered 2017-09-19: 5 mg via ORAL
  Filled 2017-09-19 (×3): qty 1

## 2017-09-19 MED ORDER — VITAMIN D 1000 UNITS PO TABS
1000.0000 [IU] | ORAL_TABLET | Freq: Every day | ORAL | Status: DC
  Administered 2017-09-19 – 2017-09-22 (×2): 1000 [IU] via ORAL
  Filled 2017-09-19 (×2): qty 1

## 2017-09-19 MED ORDER — ONDANSETRON HCL 4 MG/2ML IJ SOLN
4.0000 mg | Freq: Once | INTRAMUSCULAR | Status: AC
  Administered 2017-09-19: 4 mg via INTRAVENOUS
  Filled 2017-09-19: qty 2

## 2017-09-19 NOTE — ED Triage Notes (Signed)
Was seen by his MD on Wednesday for abd pain and had a CT scan ordered. States that the CT scan showed a pancreatitis and was sent to the ED for f/u. Was seen in the ED Wednesday and d/c home. Followed up on Friday with his regular MD and states he had blood work but does not know the results. States he is urinating but states he doesn't feel like he is emptying his bladder. Clo upper abd pain. Denies nausea. Denies diarrhea. Denies any fevers.

## 2017-09-19 NOTE — ED Notes (Signed)
Pt given pain meds for transport to [facility] per order. Wife at bedside. Updated with room number and plan.

## 2017-09-19 NOTE — ED Notes (Signed)
MD with pt  

## 2017-09-19 NOTE — ED Notes (Signed)
Report given to [facility] RN  

## 2017-09-19 NOTE — Plan of Care (Signed)
[facility] transfer Mr. Brunsman is a 47 year old male with pmh of sarcoidosis, hypertension, diabetes, obesity, and  pancreatitis diagnosed 4 days ago; who presents with complaints of decreased oral intake and continued abdominal pain.  Vital signs appear stable.  Labs relatively unremarkable except for a lipase of 52(but appears to be on upward trend).  Patient was given 1 L of normal saline IV fluids, Zofran, and 4 mg of morphine emergency department.  Admit to a MedSurg bed.

## 2017-09-19 NOTE — ED Notes (Signed)
[facility] here to transport pt to  [facility] 

## 2017-09-19 NOTE — ED Notes (Signed)
Report given to [name] with [facility]

## 2017-09-19 NOTE — ED Provider Notes (Signed)
725 AM patient requesting additional pain medicine.  He is alert Glasgow Coma Score 15 appears in no distress.  Additional intravenous morphine ordered   Doug Sou, MD 09/19/17 [phone]

## 2017-09-19 NOTE — Progress Notes (Signed)
PHARMACIST - PHYSICIAN ORDER COMMUNICATION  CONCERNING: P&T Medication Policy on Herbal Medications  DESCRIPTION:  This patient's order for:  Saw [place]  has been noted.  This product(s) is classified as an "herbal" or natural product. Due to a lack of definitive safety studies or FDA approval, nonstandard manufacturing practices, plus the potential risk of unknown drug-drug interactions while on inpatient medications, the Pharmacy and Therapeutics Committee does not permit the use of "herbal" or natural products of this type within [facility].   ACTION TAKEN: The pharmacy department is unable to verify this order at this time and your patient has been informed of this safety policy. Please reevaluate patient's clinical condition at discharge and address if the herbal or natural product(s) should be resumed at that time.  Lynann Beaver PharmD, BCPS Pager [phone] 09/19/2017 10:49 AM

## 2017-09-19 NOTE — H&P (Addendum)
History and Physical    [name] JAS:[id number] DOB: [birth year] DOA: 09/19/2017  PCP: Janith Lima, MD Patient coming from: home  I have personally briefly reviewed patient's old medical records in [place]  Chief Complaint: abdominal pain  HPI: [name] is a 47 y.o. male with medical history significant of  Non obstructive CAD, T2DM, sarcoidosis, asthma, gout presenting with several days of abdominal pain with imaging findings consistent with mild pancreatitis.  He notes that his pain started on Monday.  He describes it as a stabbing epigastric pain that radiates to his back.  He notes that it was mostly right-sided.  The pain was at its worst on Monday.  He notes it is constant, waxing and waning.  Worse with food.  Worse with movement.  Not much makes it better.  He denies any fevers, chills, cough, cold.  He does note some exertional shortness of breath as well as some exertional left-sided chest discomfort.  He notes that the left-sided chest pain happened yesterday with exertion and then this morning as well with exertion.  And improved with rest.  He describes as different from his typical angina.  He has not had any episodes of vomiting since Wednesday.  He denies any diarrhea.  Denies any new meds or alcohol use.  He was seen in the ED on April 10 and diagnosed with mild pancreatitis.  He was treated with IV fluids and pain medication and discharged with antiemetics and pain medicine and recommendation to slowly advance diet.  He followed up with his PCP on the 12th who was planning on GI follow-up with possible HIDA scan.  He represented to the emergency department today with continued epigastric pain.  ED Course: In the ED he received IV pain medicine, labs.  Admit to hospitalist due to persistent abdominal pain.  Review of Systems: As per HPI otherwise 10 point review of systems negative.   Past Medical History:  Diagnosis Date  . Anginal pain ([place])   .  Arthritis    ? 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 Followed by Dr. Trudie Reed  . Arthritis    "ankles" (04/13/2016)  . Asthma   . CORONARY ARTERY DISEASE    a. cath 05/2014: mild LAD disease, normal LCx and RCA. Risk factor modification recommended.  Marland Kitchen DIABETES MELLITUS, TYPE II   . Edema   . ERECTILE DYSFUNCTION, ORGANIC   . GERD   . Gout, unspecified   . Heart murmur   . HYPERLIPIDEMIA   . HYPERTENSION   . 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 Pericarditis    recurrent  . POLYNEUROPATHY   . PULMONARY SARCOIDOSIS    Mediastinal lymphadenopathy with biospy proven sarcodosis  . Seizures ([place])    "none in 4-5 years; don't know what kind; not related to alcohol" (04/13/2016)  . TESTICULAR HYPOFUNCTION   . TIA (transient ischemic attack)    "I don't remember when" (04/13/2016)    Past Surgical History:  Procedure Laterality Date  . BRONCHOSCOPY  08/21/08  . CARDIAC CATHETERIZATION  06/25/2009   minimal disease  . CARDIAC CATHETERIZATION N/A 04/14/2016   Procedure: Left Heart Cath and Coronary Angiography;  Surgeon: Burnell Blanks, MD;  Location: [facility];  Service: Cardiovascular;  Laterality: N/A;  . LEFT HEART CATHETERIZATION WITH CORONARY ANGIOGRAM N/A 05/18/2014   Procedure: LEFT HEART CATHETERIZATION WITH CORONARY ANGIOGRAM;  Surgeon: Jettie Booze, MD;  Location: [facility];  Service: Cardiovascular;  Laterality: N/A;  . MEDIASTINOSCOPY  11/30/08     reports that he quit smoking about 3 years ago. His smoking use included cigarettes. He has a 20.00 pack-year smoking history. He has never used smokeless tobacco. He reports that he drinks alcohol. He reports that he does not use drugs.  Allergies  Allergen Reactions  . Amlodipine Other (See Comments)    Ankle swelling with '10mg'$ , ok with '5mg'$  dose  . Sulfamethoxazole Rash    fever  . Sulfonamide Derivatives Rash and Other (See Comments)    Family History    Problem Relation Age of Onset  . Diabetes Mother   . Hypertension Mother   . Diabetes Father   . Heart disease Father 45       Fatal MI  . Heart failure Father    Prior to Admission medications   Medication Sig Start Date End Date Taking? Authorizing Provider  albuterol (VENTOLIN HFA) 108 (90 Base) MCG/ACT inhaler Inhale 2 puffs into the lungs every 6 (six) hours as needed for wheezing or shortness of breath. 07/12/17   Janith Lima, MD  allopurinol (ZYLOPRIM) 300 MG tablet TAKE 1 TABLET(300 MG) BY MOUTH DAILY 06/06/17   Janith Lima, MD  aspirin EC 81 MG tablet Take 1 tablet (81 mg total) by mouth daily. 04/29/15   Rowe Clack, MD  Blood Glucose Monitoring Suppl ([place]) w/Device KIT 1 Act by Does not apply route 3 (three) times daily with meals as needed. 04/28/16   Janith Lima, MD  carvedilol (COREG) 3.125 MG tablet Take 1 tablet (3.125 mg total) by mouth 2 (two) times daily with a meal. 06/29/17   Janith Lima, MD  cholecalciferol (VITAMIN D) 1000 units tablet Take 1,000 Units by mouth daily.    [provider]  folic acid (FOLVITE) 1 MG tablet TK 1 T PO QD 04/24/16   [provider]  glucose blood (ONETOUCH VERIO) test strip Use TID 05/07/16   Janith Lima, MD  metFORMIN (GLUCOPHAGE-XR) 500 MG 24 hr tablet TAKE 1 TABLET(500 MG) BY MOUTH TWICE DAILY WITH A MEAL 09/10/17   Janith Lima, MD  nitroGLYCERIN (NITROSTAT) 0.4 MG SL tablet Place 1 tablet (0.4 mg total) under the tongue every 5 (five) minutes as needed for chest pain. 06/28/17   Lyda Jester M, PA-C  ondansetron (ZOFRAN ODT) 4 MG disintegrating tablet '4mg'$  ODT q4 hours prn nausea/vomit 09/15/17   Jacqlyn Larsen, PA-C  oxyCODONE-acetaminophen (PERCOCET/ROXICET) 5-325 MG tablet Take 1-2 tablets by mouth every 4 (four) hours as needed for severe pain. 09/15/17   Jacqlyn Larsen, PA-C  pioglitazone (ACTOS) 15 MG tablet Take 1 tablet (15 mg total) by mouth daily. 03/10/17    Renato Shin, MD  potassium chloride (K-DUR) 10 MEQ tablet Take 1 tablet (10 mEq total) by mouth daily for 4 days. 09/16/17 09/20/17  Jacqlyn Larsen, PA-C  potassium chloride SA (K-DUR,KLOR-CON) 20 MEQ tablet TAKE 1 TABLET(20 MEQ) BY MOUTH TWICE DAILY 09/10/17   Janith Lima, MD  rosuvastatin (CRESTOR) 40 MG tablet Take 1 tablet (40 mg total) by mouth daily. 03/01/17   Janith Lima, MD    Physical Exam: Vitals:   09/19/17 0452 09/19/17 0653 09/19/17 0730 09/19/17 0841  BP: (!) 119/94 (!) 134/91 122/82 (!) 132/91  Pulse: 100 78 70 75  Resp: '18 18  18  '$ Temp: 98.6 F (37 C) 98.3 F (36.8 C)  98.6 F (37 C)  TempSrc:    Oral  SpO2: 100% 99% 100% 100%    Constitutional: NAD, calm, comfortable Vitals:   09/19/17 0452 09/19/17 0653 09/19/17 0730 09/19/17 0841  BP: (!) 119/94 (!) 134/91 122/82 (!) 132/91  Pulse: 100 78 70 75  Resp: '18 18  18  '$ Temp: 98.6 F (37 C) 98.3 F (36.8 C)  98.6 F (37 C)  TempSrc:    Oral  SpO2: 100% 99% 100% 100%   Eyes: PERRL, lids and conjunctivae normal ENMT: Mucous membranes are moist. Posterior pharynx clear of any exudate or lesions.Normal dentition.  Neck: normal, supple, no masses, no thyromegaly Respiratory: clear to auscultation bilaterally, no wheezing, no crackles. Normal respiratory effort. No accessory muscle use.  Cardiovascular: Regular rate and rhythm, no murmurs / rubs / gallops. No extremity edema. 2+ pedal pulses. No carotid bruits.  Abdomen: RUQ and epigastric TTP.  Most tender in RUQ.  Negative rebound or guarding.  No hepatosplenomegaly. Bowel sounds positive.  Musculoskeletal: no clubbing / cyanosis. No joint deformity upper and lower extremities. Good ROM, no contractures. Normal muscle tone.  Skin: no rashes, lesions, ulcers. No induration Neurologic: CN 2-12 grossly intact. Sensation intact. Strength 5/5 in all 4.  Psychiatric: Normal judgment and insight. Alert and oriented x 3. Normal mood.   Labs on Admission: I have  personally reviewed following labs and imaging studies  CBC: Recent Labs  Lab 09/14/17 0525 09/15/17 1438 09/17/17 1124 09/19/17 0500  WBC 13.7* 12.5* 7.1 5.9  NEUTROABS  --  7.1 2.9 2.0  HGB 17.0 16.7 15.5 15.3  HCT 48.1 48.4 44.9 43.1  MCV 79.0 79.6 80.1 77.1*  PLT 326 270.0 282.0 212   Basic Metabolic Panel: Recent Labs  Lab 09/14/17 0525 09/15/17 1438 09/17/17 1124 09/19/17 0500  NA 136 134* 135 135  K 3.2* 3.0* 4.0 4.4  CL 93* 91* 94* 99*  CO2 28 35* 31 26  GLUCOSE 165* 146* 129* 144*  BUN '14 9 9 8  '$ CREATININE 1.13 0.86 0.96 0.97  CALCIUM 9.3 9.9 9.4 9.1   GFR: Estimated Creatinine Clearance: 139.5 mL/min (by C-G formula based on SCr of 0.97 mg/dL). Liver Function Tests: Recent Labs  Lab 09/15/17 1438 09/17/17 1124 09/19/17 0500  AST '24 23 30  '$ ALT 56* 45 45  ALKPHOS 81 71 67  BILITOT 1.2 1.0 0.3  PROT 8.3 7.6 7.5  ALBUMIN 4.4 4.0 3.9   Recent Labs  Lab 09/15/17 1438 09/17/17 1124 09/19/17 0500  LIPASE 19.0 23.0 52*  AMYLASE 48 52  --    No results for input(s): AMMONIA in the last 168 hours. Coagulation Profile: No results for input(s): INR, PROTIME in the last 168 hours. Cardiac Enzymes: No results for input(s): CKTOTAL, CKMB, CKMBINDEX, TROPONINI in the last 168 hours. BNP (last 3 results) No results for input(s): PROBNP in the last 8760 hours. HbA1C: No results for input(s): HGBA1C in the last 72 hours. CBG: No results for input(s): GLUCAP in the last 168 hours. Lipid Profile: No results for input(s): CHOL, HDL, LDLCALC, TRIG, CHOLHDL, LDLDIRECT in the last 72 hours. Thyroid Function Tests: No results for input(s): TSH, T4TOTAL, FREET4, T3FREE, THYROIDAB in the last 72 hours. Anemia Panel: No results for input(s): VITAMINB12, FOLATE, FERRITIN, TIBC, IRON, RETICCTPCT in the last 72 hours. Urine analysis:    Component Value Date/Time   COLORURINE YELLOW 09/19/2017 0432   APPEARANCEUR CLEAR 09/19/2017 0432   LABSPEC 1.010 09/19/2017  0432   PHURINE 6.5 09/19/2017 0432   GLUCOSEU >=500 (A)  09/19/2017 0432   GLUCOSEU >=1000 (A) 09/15/2017 1438   HGBUR NEGATIVE 09/19/2017 0432   BILIRUBINUR NEGATIVE 09/19/2017 0432   KETONESUR NEGATIVE 09/19/2017 0432   PROTEINUR NEGATIVE 09/19/2017 0432   UROBILINOGEN 0.2 09/15/2017 1438   NITRITE NEGATIVE 09/19/2017 0432   LEUKOCYTESUR NEGATIVE 09/19/2017 0432    Radiological Exams on Admission: No results found.  EKG: Independently reviewed. NSR.  Diffuse J point elevation, likely due to early repol. EKG appears similar to priors.  Assessment/Plan Active Problems:   Pancreatitis  Right Upper Quadrant Abdominal Pain  Mild Pancreatitis:  Imaging from 4/10 with "mild fullness of pancreatitic head with haziness of peripancreatic fat, cannot exclude a mild uncomplicated acute pancreatitis".  Lipase mildly elevated.  No etoh.  HCTZ, methotrexate and more rarely crestor and keppra associated with pancreatitis, though with pain worst in RUQ will get [place] to help r/o stones and cholecystitis.  LFT's wnl.    RUQ [place].  Consider HIDA after if unrevealing.  Lipids  NPO IVF Oxycodone, dilaudid for breakthrough  Exertional Chest Pain  CAD: cath in 2017 with non obstructive CAD.  Notes some pain yesterday and early this morning with exertion that improved with rest.  Noted a bit different than typical anginal pain.   Trend troponins Follow EKG May need cardiology c/s pending plan for above for stress test Continue ASA, statin, carvedilol    Dyspnea on exertion: had some DOE over past day or so as well he associates with the CP.  Will continue to monitor.  Currently asx.  Follow CXR.  He appears euvolemic, stable on room air at this time.  Hypertension: on irbesartan and torsemide as well as coreg.  Hold torsemide with IVF.  He's not sure of irbesartan dose, will ask for pharmacy assistance with med rec.  Gout: flare last week, notes improved now after steroids.  Allopurinol  T2DM: SSI.   Hold metformin and actos.   Hx Sarcoidosis: hold methotrexate, follows with doc in [facility]  Hx Seizures: keppra 1000 mg BID  Asthma: prn nebs, stable  Asked pharmacy to assist with repeating med rec as pt unaware of some meds and doses and when comparing to previous clinic progress notes, some meds missing.    DVT prophylaxis: lovenox  Code Status: full  Family Communication: none at bedside  Disposition Plan: pending further evaluation of abdominal pain Consults called: none  Admission status: obs    Fayrene [facility] MD [facility] Pager [phone]  If 7PM-7AM, please contact night-coverage [email or web address] Password [email or web address]  09/19/2017, 9:11 AM

## 2017-09-19 NOTE — ED Provider Notes (Signed)
[facility] EMERGENCY DEPARTMENT Provider Note   CSN: [id number] Arrival date & time: 09/19/17  0356     History   Chief Complaint Chief Complaint  Patient presents with  . difficulty urinating    HPI [name] is a 47 y.o. male.  Patient is a 47 year old male with past medical history of sarcoidosis, hypertension, diabetes, obesity, and recently diagnosed pancreatitis.  He presents today with complaints of decreased p.o. intake and decreased urination.  He was diagnosed with pancreatitis by CT scan 4 days ago.  He was told by his primary doctor that he is concerned his gallbladder may also be the problem.  He is due to have a HIDA scan set up in the near future.  He presents tonight stating that he is not feeling any better and is having continued abdominal discomfort.  The history is provided by the patient.    Past Medical History:  Diagnosis Date  . Anginal pain ([place])   . Arthritis    ? 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 Followed by Dr. Trudie Reed  . Arthritis    "ankles" (04/13/2016)  . Asthma   . CORONARY ARTERY DISEASE    a. cath 05/2014: mild LAD disease, normal LCx and RCA. Risk factor modification recommended.  Marland Kitchen DIABETES MELLITUS, TYPE II   . Edema   . ERECTILE DYSFUNCTION, ORGANIC   . GERD   . Gout, unspecified   . Heart murmur   . HYPERLIPIDEMIA   . HYPERTENSION   . 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 Pericarditis    recurrent  . POLYNEUROPATHY   . PULMONARY SARCOIDOSIS    Mediastinal lymphadenopathy with biospy proven sarcodosis  . Seizures ([place])    "none in 4-5 years; don't know what kind; not related to alcohol" (04/13/2016)  . TESTICULAR HYPOFUNCTION   . TIA (transient ischemic attack)    "I don't remember when" (04/13/2016)    Patient Active Problem List   Diagnosis Date Noted  . Lower abdominal pain 09/18/2017  . Acute pancreatitis 09/18/2017  . Abdominal tenderness, generalized  09/15/2017  . Snoring 06/22/2017  . Primary narcolepsy without cataplexy 06/22/2017  . Periodic limb movements of sleep 06/22/2017  . Hypersomnia 06/22/2017  . Transient alteration of awareness 03/23/2017  . Nonintractable epilepsy with complex partial seizures ([place]) 03/23/2017  . Drug-induced erectile dysfunction 11/06/2015  . Body mass index (BMI) of 35.0 to 35.9 with comorbidity   . Sarcoidosis ([place]) 09/29/2010  . GERD 01/07/2010  . Exercise-induced asthma 12/03/2009  . POLYNEUROPATHY 10/17/2009  . Male hypogonadism 10/08/2009  . Diabetes mellitus type 2 in obese ([place]) 10/07/2009  . Hyperlipidemia with target LDL less than 70 10/07/2009  . Gout 10/07/2009  . Essential hypertension 10/07/2009  . CAD (coronary artery disease) 10/07/2009  . Male erectile dysfunction 10/07/2009    Past Surgical History:  Procedure Laterality Date  . BRONCHOSCOPY  08/21/08  . CARDIAC CATHETERIZATION  06/25/2009   minimal disease  . CARDIAC CATHETERIZATION N/A 04/14/2016   Procedure: Left Heart Cath and Coronary Angiography;  Surgeon: Burnell Blanks, MD;  Location: [facility];  Service: Cardiovascular;  Laterality: N/A;  . LEFT HEART CATHETERIZATION WITH CORONARY ANGIOGRAM N/A 05/18/2014   Procedure: LEFT HEART CATHETERIZATION WITH CORONARY ANGIOGRAM;  Surgeon: Jettie Booze, MD;  Location: [facility] CATH LAB;  Service: Cardiovascular;  Laterality: N/A;  . MEDIASTINOSCOPY  11/30/08        Home Medications    Prior  to Admission medications   Medication Sig Start Date End Date Taking? Authorizing Provider  albuterol (VENTOLIN HFA) 108 (90 Base) MCG/ACT inhaler Inhale 2 puffs into the lungs every 6 (six) hours as needed for wheezing or shortness of breath. 07/12/17   Janith Lima, MD  allopurinol (ZYLOPRIM) 300 MG tablet TAKE 1 TABLET(300 MG) BY MOUTH DAILY 06/06/17   Janith Lima, MD  aspirin EC 81 MG tablet Take 1 tablet (81 mg total) by mouth daily. 04/29/15   Rowe Clack, MD  Blood Glucose Monitoring Suppl ([place]) w/Device KIT 1 Act by Does not apply route 3 (three) times daily with meals as needed. 04/28/16   Janith Lima, MD  carvedilol (COREG) 3.125 MG tablet Take 1 tablet (3.125 mg total) by mouth 2 (two) times daily with a meal. 06/29/17   Janith Lima, MD  cholecalciferol (VITAMIN D) 1000 units tablet Take 1,000 Units by mouth daily.    [provider]  folic acid (FOLVITE) 1 MG tablet TK 1 T PO QD 04/24/16   [provider]  glucose blood (ONETOUCH VERIO) test strip Use TID 05/07/16   Janith Lima, MD  metFORMIN (GLUCOPHAGE-XR) 500 MG 24 hr tablet TAKE 1 TABLET(500 MG) BY MOUTH TWICE DAILY WITH A MEAL 09/10/17   Janith Lima, MD  nitroGLYCERIN (NITROSTAT) 0.4 MG SL tablet Place 1 tablet (0.4 mg total) under the tongue every 5 (five) minutes as needed for chest pain. 06/28/17   Lyda Jester M, PA-C  ondansetron (ZOFRAN ODT) 4 MG disintegrating tablet '4mg'$  ODT q4 hours prn nausea/vomit 09/15/17   Jacqlyn Larsen, PA-C  oxyCODONE-acetaminophen (PERCOCET/ROXICET) 5-325 MG tablet Take 1-2 tablets by mouth every 4 (four) hours as needed for severe pain. 09/15/17   Jacqlyn Larsen, PA-C  pioglitazone (ACTOS) 15 MG tablet Take 1 tablet (15 mg total) by mouth daily. 03/10/17   Renato Shin, MD  potassium chloride (K-DUR) 10 MEQ tablet Take 1 tablet (10 mEq total) by mouth daily for 4 days. 09/16/17 09/20/17  Jacqlyn Larsen, PA-C  potassium chloride SA (K-DUR,KLOR-CON) 20 MEQ tablet TAKE 1 TABLET(20 MEQ) BY MOUTH TWICE DAILY 09/10/17   Janith Lima, MD  rosuvastatin (CRESTOR) 40 MG tablet Take 1 tablet (40 mg total) by mouth daily. 03/01/17   Janith Lima, MD    Family History Family History  Problem Relation Age of Onset  . Diabetes Mother   . Hypertension Mother   . Diabetes Father   . Heart disease Father 29       Fatal MI  . Heart failure Father     Social History Social History   Tobacco Use  .  Smoking status: Former Smoker    Packs/day: 1.00    Years: 20.00    Pack years: 20.00    Types: Cigarettes    Last attempt to quit: 05/18/2014    Years since quitting: 3.3  . Smokeless tobacco: Never Used  Substance Use Topics  . Alcohol use: Yes    Alcohol/week: 0.0 oz    Comment: 04/13/2016 "glass of wine a couple times/month"  . Drug use: No     Allergies   Amlodipine; Sulfamethoxazole; and Sulfonamide derivatives   Review of Systems Review of Systems  All other systems reviewed and are negative.    Physical Exam Updated Vital Signs There were no vitals taken for this visit.  Physical Exam  Constitutional: He is oriented to person, place, and time. He  appears well-developed and well-nourished. No distress.  HENT:  Head: Normocephalic and atraumatic.  Mouth/Throat: Oropharynx is clear and moist.  Neck: Normal range of motion. Neck supple.  Cardiovascular: Normal rate and regular rhythm. Exam reveals no friction rub.  No murmur heard. Pulmonary/Chest: Effort normal and breath sounds normal. No respiratory distress. He has no wheezes. He has no rales.  Abdominal: Soft. Bowel sounds are normal. He exhibits no distension. There is tenderness. There is no rebound and no guarding.  There is tenderness to palpation in the epigastric region and right upper quadrant.  Musculoskeletal: Normal range of motion. He exhibits no edema.  Neurological: He is alert and oriented to person, place, and time. Coordination normal.  Skin: Skin is warm and dry. He is not diaphoretic.  Nursing note and vitals reviewed.    ED Treatments / Results  Labs (all labs ordered are listed, but only abnormal results are displayed) Labs Reviewed  URINALYSIS, ROUTINE W REFLEX MICROSCOPIC  COMPREHENSIVE METABOLIC PANEL  LIPASE, BLOOD  CBC WITH DIFFERENTIAL/PLATELET    EKG None  Radiology No results found.  Procedures Procedures (including critical care time)  Medications Ordered in  ED Medications  sodium chloride 0.9 % bolus 1,000 mL (has no administration in time range)     Initial Impression / Assessment and Plan / ED Course  I have reviewed the triage vital signs and the nursing notes.  Pertinent labs & imaging results that were available during my care of the patient were reviewed by me and considered in my medical decision making (see chart for details).  Patient presenting to the ER for the third time in 1 week with complaints of epigastric pain.  He had a CT scan performed several days ago which revealed fullness and inflammation of the pancreatic head consistent with pancreatitis.  He tells me that he has not been able to eat or drink and that his pain is not adequately controlled with the medication that was prescribed for him.  He was given IV fluids and pain medicine here in the ER, but reports apprehension about going home.   He will be admitted to the Hospitalist service under the care of Dr. Tamala Julian.  Final Clinical Impressions(s) / ED Diagnoses   Final diagnoses:  None    ED Discharge Orders    None       Veryl Speak, MD 09/19/17 [phone]

## 2017-09-20 ENCOUNTER — Encounter (HOSPITAL_COMMUNITY): Payer: Self-pay

## 2017-09-20 ENCOUNTER — Other Ambulatory Visit: Payer: Self-pay | Admitting: Family

## 2017-09-20 ENCOUNTER — Observation Stay (HOSPITAL_COMMUNITY): Payer: Medicare Other

## 2017-09-20 DIAGNOSIS — Z79811 Long term (current) use of aromatase inhibitors: Secondary | ICD-10-CM | POA: Diagnosis not present

## 2017-09-20 DIAGNOSIS — J45909 Unspecified asthma, uncomplicated: Secondary | ICD-10-CM | POA: Diagnosis not present

## 2017-09-20 DIAGNOSIS — N62 Hypertrophy of breast: Secondary | ICD-10-CM | POA: Diagnosis present

## 2017-09-20 DIAGNOSIS — Z7982 Long term (current) use of aspirin: Secondary | ICD-10-CM | POA: Diagnosis not present

## 2017-09-20 DIAGNOSIS — Z6835 Body mass index (BMI) 35.0-35.9, adult: Secondary | ICD-10-CM | POA: Diagnosis not present

## 2017-09-20 DIAGNOSIS — D860 Sarcoidosis of lung: Secondary | ICD-10-CM | POA: Diagnosis not present

## 2017-09-20 DIAGNOSIS — R1011 Right upper quadrant pain: Secondary | ICD-10-CM | POA: Diagnosis not present

## 2017-09-20 DIAGNOSIS — K8010 Calculus of gallbladder with chronic cholecystitis without obstruction: Secondary | ICD-10-CM | POA: Diagnosis not present

## 2017-09-20 DIAGNOSIS — R112 Nausea with vomiting, unspecified: Secondary | ICD-10-CM | POA: Diagnosis not present

## 2017-09-20 DIAGNOSIS — D8689 Sarcoidosis of other sites: Secondary | ICD-10-CM | POA: Diagnosis present

## 2017-09-20 DIAGNOSIS — M109 Gout, unspecified: Secondary | ICD-10-CM | POA: Diagnosis not present

## 2017-09-20 DIAGNOSIS — G47419 Narcolepsy without cataplexy: Secondary | ICD-10-CM | POA: Diagnosis present

## 2017-09-20 DIAGNOSIS — N529 Male erectile dysfunction, unspecified: Secondary | ICD-10-CM | POA: Diagnosis present

## 2017-09-20 DIAGNOSIS — Z79899 Other long term (current) drug therapy: Secondary | ICD-10-CM | POA: Diagnosis not present

## 2017-09-20 DIAGNOSIS — I2510 Atherosclerotic heart disease of native coronary artery without angina pectoris: Secondary | ICD-10-CM | POA: Diagnosis not present

## 2017-09-20 DIAGNOSIS — K859 Acute pancreatitis without necrosis or infection, unspecified: Secondary | ICD-10-CM

## 2017-09-20 DIAGNOSIS — I10 Essential (primary) hypertension: Secondary | ICD-10-CM | POA: Diagnosis not present

## 2017-09-20 DIAGNOSIS — E119 Type 2 diabetes mellitus without complications: Secondary | ICD-10-CM | POA: Diagnosis not present

## 2017-09-20 DIAGNOSIS — G40909 Epilepsy, unspecified, not intractable, without status epilepticus: Secondary | ICD-10-CM | POA: Diagnosis present

## 2017-09-20 DIAGNOSIS — Z87891 Personal history of nicotine dependence: Secondary | ICD-10-CM | POA: Diagnosis not present

## 2017-09-20 DIAGNOSIS — K8020 Calculus of gallbladder without cholecystitis without obstruction: Secondary | ICD-10-CM | POA: Diagnosis not present

## 2017-09-20 DIAGNOSIS — E1142 Type 2 diabetes mellitus with diabetic polyneuropathy: Secondary | ICD-10-CM | POA: Diagnosis not present

## 2017-09-20 DIAGNOSIS — Z7984 Long term (current) use of oral hypoglycemic drugs: Secondary | ICD-10-CM | POA: Diagnosis not present

## 2017-09-20 DIAGNOSIS — E785 Hyperlipidemia, unspecified: Secondary | ICD-10-CM | POA: Diagnosis not present

## 2017-09-20 DIAGNOSIS — K219 Gastro-esophageal reflux disease without esophagitis: Secondary | ICD-10-CM | POA: Diagnosis not present

## 2017-09-20 DIAGNOSIS — K760 Fatty (change of) liver, not elsewhere classified: Secondary | ICD-10-CM | POA: Diagnosis present

## 2017-09-20 DIAGNOSIS — Z8673 Personal history of transient ischemic attack (TIA), and cerebral infarction without residual deficits: Secondary | ICD-10-CM | POA: Diagnosis not present

## 2017-09-20 DIAGNOSIS — R109 Unspecified abdominal pain: Secondary | ICD-10-CM | POA: Diagnosis not present

## 2017-09-20 LAB — HEPATIC FUNCTION PANEL
ALT: 43 U/L (ref 17–63)
AST: 26 U/L (ref 15–41)
Albumin: 3.1 g/dL — ABNORMAL LOW (ref 3.5–5.0)
Alkaline Phosphatase: 56 U/L (ref 38–126)
Bilirubin, Direct: 0.1 mg/dL (ref 0.1–0.5)
Indirect Bilirubin: 0.7 mg/dL (ref 0.3–0.9)
Total Bilirubin: 0.8 mg/dL (ref 0.3–1.2)
Total Protein: 6.3 g/dL — ABNORMAL LOW (ref 6.5–8.1)

## 2017-09-20 LAB — CBC
HCT: 39.5 % (ref 39.0–52.0)
Hemoglobin: 13.6 g/dL (ref 13.0–17.0)
MCH: 27.5 pg (ref 26.0–34.0)
MCHC: 34.4 g/dL (ref 30.0–36.0)
MCV: 79.8 fL (ref 78.0–100.0)
Platelets: 213 10*3/uL (ref 150–400)
RBC: 4.95 MIL/uL (ref 4.22–5.81)
RDW: 14.3 % (ref 11.5–15.5)
WBC: 5.6 10*3/uL (ref 4.0–10.5)

## 2017-09-20 LAB — BASIC METABOLIC PANEL
Anion gap: 8 (ref 5–15)
BUN: 9 mg/dL (ref 6–20)
CO2: 27 mmol/L (ref 22–32)
Calcium: 8.7 mg/dL — ABNORMAL LOW (ref 8.9–10.3)
Chloride: 103 mmol/L (ref 101–111)
Creatinine, Ser: 0.85 mg/dL (ref 0.61–1.24)
GFR calc Af Amer: >60 mL/min (ref >60)
GFR calc non Af Amer: >60 mL/min (ref >60)
Glucose, Bld: 117 mg/dL — ABNORMAL HIGH (ref 65–99)
Potassium: 4 mmol/L (ref 3.5–5.1)
Sodium: 138 mmol/L (ref 135–145)

## 2017-09-20 LAB — HIV ANTIBODY (ROUTINE TESTING W REFLEX): HIV Screen 4th Generation wRfx: NONREACTIVE

## 2017-09-20 LAB — SURGICAL PCR SCREEN
MRSA, PCR: NEGATIVE
Staphylococcus aureus: NEGATIVE

## 2017-09-20 LAB — GLUCOSE, CAPILLARY
Glucose-Capillary: 117 mg/dL — ABNORMAL HIGH (ref 65–99)
Glucose-Capillary: 104 mg/dL — ABNORMAL HIGH (ref 65–99)
Glucose-Capillary: 108 mg/dL — ABNORMAL HIGH (ref 65–99)
Glucose-Capillary: 181 mg/dL — ABNORMAL HIGH (ref 65–99)

## 2017-09-20 LAB — LIPASE, BLOOD: Lipase: 32 U/L (ref 11–51)

## 2017-09-20 MED ORDER — HYDROCODONE-ACETAMINOPHEN 5-325 MG PO TABS
1.0000 | ORAL_TABLET | ORAL | Status: DC | PRN
  Administered 2017-09-20 – 2017-09-21 (×4): 2 via ORAL
  Filled 2017-09-20 (×4): qty 2

## 2017-09-20 MED ORDER — TECHNETIUM TC 99M MEBROFENIN IV KIT
5.3000 | PACK | Freq: Once | INTRAVENOUS | Status: AC | PRN
  Administered 2017-09-20: 5.3 via INTRAVENOUS

## 2017-09-20 MED ORDER — DEXTROSE 5 % IV SOLN
3.0000 g | INTRAVENOUS | Status: AC
  Administered 2017-09-21: 3 g via INTRAVENOUS
  Filled 2017-09-20: qty 3

## 2017-09-20 MED ORDER — MORPHINE SULFATE (PF) 4 MG/ML IV SOLN
INTRAVENOUS | Status: AC
  Filled 2017-09-20: qty 2

## 2017-09-20 MED ORDER — COLCHICINE 0.6 MG PO TABS: 0.6000 mg | ORAL_TABLET | ORAL | Status: DC | PRN

## 2017-09-20 MED ORDER — MORPHINE SULFATE (PF) 4 MG/ML IV SOLN
5.3000 mg | Freq: Once | INTRAVENOUS | Status: AC
  Administered 2017-09-20: 5.3 mg via INTRAVENOUS

## 2017-09-20 NOTE — Consult Note (Signed)
Reason for Consult: Abdominal pain, gallstones  Referring Physician: Ferrell Claiborne is an 47 y.o. male.  HPI: I was asked to evaluate this patient for abdominal pain and gallstones.  He is a very pleasant 47 year old African-American male who was in his usual state of health until approximately 6 days ago.  At that time he awoke at night with the acute onset of severe pressure-like epigastric and mid abdominal pain associated with nausea and vomiting.  He went to the emergency room but after waiting for a number of hours felt a little better and went home.  After eating the pain recurred and he returned to the emergency department.  A CT scan was obtained showing possible mild pancreatitis around the head of the pancreas.  Gallbladder ultrasound shows multiple small gallstones.  He was discharged to follow-up with his primary but the pain never really went away.  It occurs mainly when he eats.  It has become more localized in the right upper quadrant and radiating around to his back.  Associated with nausea and occasional vomiting.  It is not as bad if he does not eat.  He has felt constipated.  No fever chills or jaundice.  He has no chronic previous GI complaints.  He is diabetic oral agent controlled.  History of pulmonary and neurosarcoid under control.  Also history of nonobstructive coronary artery disease.  Saw his cardiologist in January and has not been having any chest pain or shortness of breath.  Past Medical History:  Diagnosis Date  . Anginal pain ([place])   . Arthritis    ? 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 Followed by Dr. Trudie Reed  . Arthritis    "ankles" (04/13/2016)  . Asthma   . CORONARY ARTERY DISEASE    a. cath 05/2014: mild LAD disease, normal LCx and RCA. Risk factor modification recommended.  Marland Kitchen DIABETES MELLITUS, TYPE II   . Edema   . ERECTILE DYSFUNCTION, ORGANIC   . GERD   . Gout, unspecified   . Heart murmur   . HYPERLIPIDEMIA   . HYPERTENSION   .  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 Pericarditis    recurrent  . POLYNEUROPATHY   . PULMONARY SARCOIDOSIS    Mediastinal lymphadenopathy with biospy proven sarcodosis  . Seizures ([place])    "none in 4-5 years; don't know what kind; not related to alcohol" (04/13/2016)  . TESTICULAR HYPOFUNCTION   . TIA (transient ischemic attack)    "I don't remember when" (04/13/2016)    Past Surgical History:  Procedure Laterality Date  . BRONCHOSCOPY  08/21/08  . CARDIAC CATHETERIZATION  06/25/2009   minimal disease  . CARDIAC CATHETERIZATION N/A 04/14/2016   Procedure: Left Heart Cath and Coronary Angiography;  Surgeon: Burnell Blanks, MD;  Location: [place];  Service: Cardiovascular;  Laterality: N/A;  . LEFT HEART CATHETERIZATION WITH CORONARY ANGIOGRAM N/A 05/18/2014   Procedure: LEFT HEART CATHETERIZATION WITH CORONARY ANGIOGRAM;  Surgeon: Jettie Booze, MD;  Location: [facility] CATH LAB;  Service: Cardiovascular;  Laterality: N/A;  . MEDIASTINOSCOPY  11/30/08    Family History  Problem Relation Age of Onset  . Diabetes Mother   . Hypertension Mother   . Diabetes Father   . Heart disease Father 31       Fatal MI  . Heart failure Father     Social History:  reports that he quit smoking about 3 years ago. His smoking use included  cigarettes. He has a 20.00 pack-year smoking history. He has never used smokeless tobacco. He reports that he drinks alcohol. He reports that he does not use drugs.  Allergies:  Allergies  Allergen Reactions  . Amlodipine Other (See Comments)    Ankle swelling with '10mg'$ , ok with '5mg'$  dose  . Sulfamethoxazole Rash    fever  . Sulfonamide Derivatives Rash and Other (See Comments)    Current Facility-Administered Medications  Medication Dose Route Frequency Provider Last Rate Last Dose  . acetaminophen (TYLENOL) tablet 650 mg  650 mg Oral Q6H PRN Elodia Florence., MD       Or  . acetaminophen  (TYLENOL) suppository 650 mg  650 mg Rectal Q6H PRN Elodia Florence., MD      . albuterol (PROVENTIL) (2.5 MG/3ML) 0.083% nebulizer solution 2.5 mg  2.5 mg Inhalation Q6H PRN Elodia Florence., MD      . allopurinol (ZYLOPRIM) tablet 300 mg  300 mg Oral Daily Elodia Florence., MD   300 mg at 09/19/17 1127  . anastrozole (ARIMIDEX) tablet 1 mg  1 mg Oral Daily Elodia Florence., MD   1 mg at 09/19/17 2220  . aspirin EC tablet 81 mg  81 mg Oral Daily Elodia Florence., MD      . carvedilol (COREG) tablet 3.125 mg  3.125 mg Oral BID WC Elodia Florence., MD   3.125 mg at 09/20/17 1517  . [START ON 09/21/2017] ceFAZolin (ANCEF) 3 g in dextrose 5 % 50 mL IVPB  3 g Intravenous To OR Excell Seltzer, MD      . cholecalciferol (VITAMIN D) tablet 1,000 Units  1,000 Units Oral Daily Elodia Florence., MD   1,000 Units at 09/19/17 1128  . colchicine tablet 0.6 mg  0.6 mg Oral Q4H PRN Purohit, Shrey C, MD      . enoxaparin (LOVENOX) injection 60 mg  60 mg Subcutaneous Q24H Elodia Florence., MD   60 mg at 09/19/17 1128  . folic acid (FOLVITE) tablet 1 mg  1 mg Oral Daily Elodia Florence., MD   1 mg at 09/19/17 1127  . HYDROcodone-acetaminophen (NORCO/VICODIN) 5-325 MG per tablet 1-2 tablet  1-2 tablet Oral Q4H PRN Purohit, Konrad Dolores, MD   2 tablet at 09/20/17 1718  . HYDROmorphone (DILAUDID) injection 0.5 mg  0.5 mg Intravenous Q3H PRN Elodia Florence., MD   0.5 mg at 09/20/17 0549  . insulin aspart (novoLOG) injection 0-5 Units  0-5 Units Subcutaneous QHS Elodia Florence., MD      . insulin aspart (novoLOG) injection 0-9 Units  0-9 Units Subcutaneous TID WC Elodia Florence., MD      . lactated ringers infusion   Intravenous Continuous Purohit, Konrad Dolores, MD 100 mL/hr at 09/20/17 1721    . levETIRAcetam (KEPPRA) tablet 1,000 mg  1,000 mg Oral BID Elodia Florence., MD   1,000 mg at 09/19/17 2220  . morphine 4 MG/ML injection           .  ondansetron (ZOFRAN) tablet 4 mg  4 mg Oral Q6H PRN Elodia Florence., MD       Or  . ondansetron [facility]) injection 4 mg  4 mg Intravenous Q6H PRN Elodia Florence., MD      . rosuvastatin (CRESTOR) tablet 40 mg  40 mg Oral Daily Elodia Florence., MD   40  mg at 09/19/17 1126  . vitamin B-12 (CYANOCOBALAMIN) tablet 250 mcg  250 mcg Oral Daily Elodia Florence., MD   250 mcg at 09/19/17 1347     Results for orders placed or performed during the hospital encounter of 09/19/17 (from the past 48 hour(s))  Urinalysis, Routine w reflex microscopic     Status: Abnormal   Collection Time: 09/19/17  4:32 AM  Result Value Ref Range   Color, Urine YELLOW YELLOW   APPearance CLEAR CLEAR   Specific Gravity, Urine 1.010 1.005 - 1.030   pH 6.5 5.0 - 8.0   Glucose, UA >=500 (A) NEGATIVE mg/dL   Hgb urine dipstick NEGATIVE NEGATIVE   Bilirubin Urine NEGATIVE NEGATIVE   Ketones, ur NEGATIVE NEGATIVE mg/dL   Protein, ur NEGATIVE NEGATIVE mg/dL   Nitrite NEGATIVE NEGATIVE   Leukocytes, UA NEGATIVE NEGATIVE    Comment: Performed at [facility], [address] [place], [place] [address]  Urinalysis, Microscopic (reflex)     Status: Abnormal   Collection Time: 09/19/17  4:32 AM  Result Value Ref Range   RBC / HPF 0-5 0 - 5 RBC/hpf   WBC, UA 0-5 0 - 5 WBC/hpf   Bacteria, UA NONE SEEN NONE SEEN   Squamous Epithelial / LPF 0-5 (A) NONE SEEN    Comment: Performed at [facility], [address] [place], [place] [address]  Comprehensive metabolic panel     Status: Abnormal   Collection Time: 09/19/17  5:00 AM  Result Value Ref Range   Sodium 135 135 - 145 mmol/L   Potassium 4.4 3.5 - 5.1 mmol/L   Chloride 99 (L) 101 - 111 mmol/L   CO2 26 22 - 32 mmol/L   Glucose, Bld 144 (H) 65 - 99 mg/dL   BUN 8 6 - 20 mg/dL   Creatinine, Ser 0.97 0.61 - 1.24 mg/dL   Calcium 9.1 8.9 - 10.3 mg/dL   Total Protein 7.5 6.5 - 8.1 g/dL   Albumin 3.9 3.5 - 5.0 g/dL   AST 30 15  - 41 U/L   ALT 45 17 - 63 U/L   Alkaline Phosphatase 67 38 - 126 U/L   Total Bilirubin 0.3 0.3 - 1.2 mg/dL   GFR calc non Af Amer >60 >60 mL/min   GFR calc Af Amer >60 >60 mL/min    Comment: (NOTE) The eGFR has been calculated using the CKD EPI equation. This calculation has not been validated in all clinical situations. eGFR's persistently <60 mL/min signify possible Chronic Kidney Disease.    Anion gap 10 5 - 15    Comment: Performed at [facility], [address] [place], [place] [address]  Lipase, blood     Status: Abnormal   Collection Time: 09/19/17  5:00 AM  Result Value Ref Range   Lipase 52 (H) 11 - 51 U/L    Comment: Performed at [facility], [address] [place], [place] [address]  CBC with Differential     Status: Abnormal   Collection Time: 09/19/17  5:00 AM  Result Value Ref Range   WBC 5.9 4.0 - 10.5 K/uL   RBC 5.59 4.22 - 5.81 MIL/uL   Hemoglobin 15.3 13.0 - 17.0 g/dL   HCT 43.1 39.0 - 52.0 %   MCV 77.1 (L) 78.0 - 100.0 fL   MCH 27.4 26.0 - 34.0 pg   MCHC 35.5 30.0 - 36.0 g/dL    Comment:  CORRECTED FOR COLD AGGLUTININS   RDW 14.4 11.5 - 15.5 %   Platelets 228 150 - 400 K/uL   Neutrophils Relative % 34 %   Lymphocytes Relative 47 %   Monocytes Relative 14 %   Eosinophils Relative 4 %   Basophils Relative 1 %   Neutro Abs 2.0 1.7 - 7.7 K/uL   Lymphs Abs 2.8 0.7 - 4.0 K/uL   Monocytes Absolute 0.8 0.1 - 1.0 K/uL   Eosinophils Absolute 0.2 0.0 - 0.7 K/uL   Basophils Absolute 0.1 0.0 - 0.1 K/uL   Smear Review MORPHOLOGY UNREMARKABLE     Comment: Performed at [facility], [address] [place], [place] [address]  Lipid panel     Status: Abnormal   Collection Time: 09/19/17  9:09 AM  Result Value Ref Range   Cholesterol 176 0 - 200 mg/dL   Triglycerides 97 <150 mg/dL   HDL 34 (L) >40 mg/dL   Total CHOL/HDL Ratio 5.2 RATIO   VLDL 19 0 - 40 mg/dL   LDL Cholesterol 123 (H) 0 - 99 mg/dL    Comment:        Total  Cholesterol/HDL:CHD Risk Coronary Heart Disease Risk Table                     Men   Women  1/2 Average Risk   3.4   3.3  Average Risk       5.0   4.4  2 X Average Risk   9.6   7.1  3 X Average Risk  23.4   11.0        Use the calculated Patient Ratio above and the CHD Risk Table to determine the patient's CHD Risk.        ATP III CLASSIFICATION (LDL):  <100     mg/dL   Optimal  100-129  mg/dL   Near or Above                    Optimal  130-159  mg/dL   Borderline  160-189  mg/dL   High  >190     mg/dL   Very High Performed at [facility] [address], [place], [place] [address]   HIV antibody (Routine Testing)     Status: None   Collection Time: 09/19/17  9:10 AM  Result Value Ref Range   HIV Screen 4th Generation wRfx Non Reactive Non Reactive    Comment: (NOTE) Performed At: [facility] [place], [place] [id number] Rush Farmer MD [id number] Performed at [facility], [place] [address] [place], [place] [address]   CBC     Status: None   Collection Time: 09/19/17  9:10 AM  Result Value Ref Range   WBC 6.5 4.0 - 10.5 K/uL   RBC 5.41 4.22 - 5.81 MIL/uL   Hemoglobin 15.1 13.0 - 17.0 g/dL   HCT 43.0 39.0 - 52.0 %   MCV 79.5 78.0 - 100.0 fL   MCH 27.9 26.0 - 34.0 pg   MCHC 35.1 30.0 - 36.0 g/dL   RDW 14.3 11.5 - 15.5 %   Platelets 254 150 - 400 K/uL    Comment: Performed at [facility], [place] [address], [place], [place] [address]  Creatinine, serum     Status: None   Collection Time: 09/19/17  9:10 AM  Result Value Ref Range   Creatinine, Ser 0.84 0.61 - 1.24 mg/dL  GFR calc non Af Amer >60 >60 mL/min   GFR calc Af Amer >60 >60 mL/min    Comment: (NOTE) The eGFR has been calculated using the CKD EPI equation. This calculation has not been validated in all clinical situations. eGFR's persistently <60 mL/min signify possible Chronic Kidney Disease. Performed at [facility], [place] [address], [place], [place] [address]   Troponin I     Status: None   Collection Time: 09/19/17  9:10 AM  Result Value Ref Range   Troponin I <0.03 <0.03 ng/mL    Comment: Performed at [facility], [place] [address] [place], [place] [address]  Glucose, capillary     Status: Abnormal   Collection Time: 09/19/17 11:52 AM  Result Value Ref Range   Glucose-Capillary 113 (H) 65 - 99 mg/dL  Troponin I     Status: None   Collection Time: 09/19/17  3:09 PM  Result Value Ref Range   Troponin I <0.03 <0.03 ng/mL    Comment: Performed at [facility], [place], [place] [address], [place], [place] [address]  Glucose, capillary     Status: Abnormal   Collection Time: 09/19/17  4:36 PM  Result Value Ref Range   Glucose-Capillary 113 (H) 65 - 99 mg/dL  Troponin I     Status: None   Collection Time: 09/19/17  9:05 PM  Result Value Ref Range   Troponin I <0.03 <0.03 ng/mL    Comment: Performed at [facility], [place] [address] [place],  [address]  Glucose, capillary     Status: Abnormal   Collection Time: 09/19/17 10:13 PM  Result Value Ref Range   Glucose-Capillary 104 (H) 65 - 99 mg/dL  CBC     Status: None   Collection Time: 09/20/17  4:22 AM  Result Value Ref Range   WBC 5.6 4.0 - 10.5 K/uL   RBC 4.95 4.22 - 5.81 MIL/uL   Hemoglobin 13.6 13.0 - 17.0 g/dL   HCT 39.5 39.0 - 52.0 %   MCV 79.8 78.0 - 100.0 fL   MCH 27.5 26.0 - 34.0 pg   MCHC 34.4 30.0 - 36.0 g/dL   RDW 14.3 11.5 - 15.5 %   Platelets 213 150 - 400 K/uL    Comment: Performed at [facility], [place] [address], [place], [place] [address]  Lipase, blood     Status: None   Collection Time: 09/20/17  4:22 AM  Result Value Ref Range   Lipase 32 11 - 51 U/L    Comment: Performed at [facility], [place] [address] [place],  [address]  Basic metabolic panel     Status: Abnormal   Collection Time: 09/20/17  4:22 AM   Result Value Ref Range   Sodium 138 135 - 145 mmol/L   Potassium 4.0 3.5 - 5.1 mmol/L   Chloride 103 101 - 111 mmol/L   CO2 27 22 - 32 mmol/L   Glucose, Bld 117 (H) 65 - 99 mg/dL   BUN 9 6 - 20 mg/dL   Creatinine, Ser 0.85 0.61 - 1.24 mg/dL   Calcium 8.7 (L) 8.9 - 10.3 mg/dL   GFR calc non Af Amer >60 >60 mL/min   GFR calc Af Amer >60 >60 mL/min    Comment: (NOTE) The eGFR has been calculated using the CKD EPI equation. This calculation has not been validated in all clinical situations. eGFR's persistently <60 mL/min signify possible Chronic Kidney Disease.  Anion gap 8 5 - 15    Comment: Performed at [facility], [place] [address], [place], [place] [address]  Hepatic function panel     Status: Abnormal   Collection Time: 09/20/17  4:22 AM  Result Value Ref Range   Total Protein 6.3 (L) 6.5 - 8.1 g/dL   Albumin 3.1 (L) 3.5 - 5.0 g/dL   AST 26 15 - 41 U/L   ALT 43 17 - 63 U/L   Alkaline Phosphatase 56 38 - 126 U/L   Total Bilirubin 0.8 0.3 - 1.2 mg/dL   Bilirubin, Direct 0.1 0.1 - 0.5 mg/dL   Indirect Bilirubin 0.7 0.3 - 0.9 mg/dL    Comment: Performed at [facility], [place] [address] [place], [place] [address]  Glucose, capillary     Status: Abnormal   Collection Time: 09/20/17  7:42 AM  Result Value Ref Range   Glucose-Capillary 117 (H) 65 - 99 mg/dL  Glucose, capillary     Status: Abnormal   Collection Time: 09/20/17 11:49 AM  Result Value Ref Range   Glucose-Capillary 104 (H) 65 - 99 mg/dL  Glucose, capillary     Status: Abnormal   Collection Time: 09/20/17  5:08 PM  Result Value Ref Range   Glucose-Capillary 108 (H) 65 - 99 mg/dL    Nm Hepato W/eject Fract  Result Date: 09/20/2017 CLINICAL DATA:  Right upper quadrant pain with nausea and vomiting EXAM: NUCLEAR MEDICINE HEPATOBILIARY IMAGING TECHNIQUE: Sequential images of the abdomen were obtained out to 60 minutes following intravenous administration of radiopharmaceutical.  RADIOPHARMACEUTICALS:  5.3 mCi Tc-81m Choletec IV COMPARISON:  Ultrasound from 09/19/2017 FINDINGS: There is prompt uptake of biliary tracer throughout the liver. Visualization of the biliary tree with opacification of the small bowel is noted. No significant gallbladder opacification is noted. 5.3 mg of morphine was then administered intravenously and repeat imaging was obtained. On significant delayed images following morphine administration there was filling within the gallbladder. IMPRESSION: Patent cystic duct although the gallbladder fills in a somewhat delayed fashion likely related consistent with abnormal gallbladder function. This may represent some chronic cholecystitis. No biliary obstruction is seen. Electronically Signed   By: MInez CatalinaM.D.   On: 09/20/2017 15:39   Dg Chest Port 1 View  Result Date: 09/19/2017 CLINICAL DATA:  Shortness of breath. Heart murmur. Coronary artery disease. EXAM: PORTABLE CHEST 1 VIEW COMPARISON:  09/15/2017 FINDINGS: The heart size and mediastinal contours are within normal limits. Both lungs are clear. The visualized skeletal structures are unremarkable. IMPRESSION: No active disease. Electronically Signed   By: JEarle GellM.D.   On: 09/19/2017 11:05   UKoreaAbdomen Limited Ruq  Result Date: 09/19/2017 CLINICAL DATA:  Abdominal pain since Wednesday, pancreatitis. EXAM: ULTRASOUND ABDOMEN LIMITED RIGHT UPPER QUADRANT COMPARISON:  None. FINDINGS: Gallbladder: Small layering gallstones. No gallbladder wall thickening, pericholecystic fluid, sonographic Murphy's sign or other secondary signs of acute cholecystitis. Common bile duct: Diameter: 4 mm Liver: No focal lesion identified. Diffusely echogenic parenchyma indicating fatty infiltration. Portal vein is patent on color Doppler imaging with normal direction of blood flow towards the liver. IMPRESSION: 1. Cholelithiasis without evidence of acute cholecystitis. 2. No bile duct dilatation. 3. Fatty infiltration of  the liver. Electronically Signed   By: SFranki CabotM.D.   On: 09/19/2017 14:16    Review of Systems  Constitutional: Negative for chills, fever and weight loss.  Respiratory: Negative for cough and shortness of breath.   Cardiovascular: Negative for  chest pain and leg swelling.  Gastrointestinal: Positive for constipation, nausea and vomiting. Negative for blood in stool and diarrhea.  Genitourinary: Negative.    Blood pressure (!) 156/92, pulse 82, temperature 98.2 F (36.8 C), temperature source Oral, resp. rate 16, height '6\' 3"'$  (1.905 m), weight 132.5 kg (292 lb 1.8 oz), SpO2 100 %. Physical Exam General: Alert, moderately obese African-American male, in no distress Skin: Warm and dry without rash or infection. HEENT: No palpable masses or thyromegaly. Sclera nonicteric. Pupils equal round and reactive.  Lymph nodes: No cervical, supraclavicular, or inguinal nodes palpable. Lungs: Breath sounds clear and equal without increased work of breathing Cardiovascular: Regular rate and rhythm without murmur. No JVD or edema.  Abdomen: Nondistended.  Mild epigastric and right upper tenderness without guarding.. No masses palpable. No organomegaly. No palpable hernias. Extremities: No edema or joint swelling or deformity. No chronic venous stasis changes. Neurologic: Alert and fully oriented.  Affect normal.  No gross motor deficits.  Assessment/Plan: 47 year old diabetic male with stable medical problems including sarcoidosis, coronary artery disease and diabetes.  Presents with recent onset of recurrent epigastric and right upper quadrant abdominal pain, nausea and vomiting, entirely consistent with biliary tract disease.  Ultrasound shows gallstones.  Some evidence of possible mild pancreatitis.  HIDA shows patent cystic duct.  Currently no elevated LFTs or lipase.  I suspect that he has persistent biliary colic or early acute cholecystitis and may have had a mild episode of gallstone  pancreatitis.  I do not think that any further workup is necessary.  I believe the patient would benefit from laparoscopic cholecystectomy. I discussed the procedure in detail with the patient and his wife.   We discussed the risks and benefits of a laparoscopic cholecystectomy and possible cholangiogram including, but not limited to bleeding, infection, injury to surrounding structures such as the intestine or liver, bile leak, retained gallstones, need to convert to an open procedure, prolonged diarrhea, blood clots such as  DVT, common bile duct injury, anesthesia risks, and possible need for additional procedures.  The likelihood of improvement in symptoms and return to the patient's normal status is good. We discussed the typical post-operative recovery course. His cardiac disease seems very stable and asymptomatic.  I would however like to have the medical team confirm that no further cardiac workup would be required preoperatively.  I have tentatively scheduled him for laparoscopic cholecystectomy with cholangiogram tomorrow afternoon.  Darene Lamer Laconda Basich 09/20/2017, 5:34 PM

## 2017-09-20 NOTE — Progress Notes (Signed)
PROGRESS NOTE    [name]  EEF:[id number] DOB: February 26, 1971 DOA: 09/19/2017 PCP: Xavier Grandchild, MD    Brief Narrative:   47 year old with past medical history relevant for type 2 diabetes on oral hypoglycemics, hypertension, sarcoidosis with neurological and pulmonary involvement, gated by seizures, gynecomastia, nonobstructive coronary artery disease, gout, asthma who presented with abdominal pain after ED visit on 09/15/2017 with diagnosis of mild pancreatitis.   Assessment & Plan:   Principal Problem:   Right upper quadrant abdominal pain Active Problems:   Diabetes mellitus type 2 in obese (HCC)   Gout   Essential hypertension   CAD (coronary artery disease)   GERD   Sarcoidosis (HCC)   Acute pancreatitis   Pancreatitis   #) Abdominal pain: Patient's lipase today was only 52.  CT scan done during prior ED visit showed only very mild haziness at the head of the pancreas with equivocal findings for pancreatitis.  Patient primarily reports pain postprandially raising the suspicion for possible biliary colic particularly since he has quite a few gallstones.  Other causes for this abdominal pain could be very well related to sarcoidosis of the bowel and/or GI tract however this would be exceedingly rare. -HIDA scan, n.p.o. for this -We will consider GI for EGD if this persists  #) Gout: -Continue allopurinol 300 mg daily -Continue PRN colchicine 0.6 mg every 4 hours as needed  #) Gynecomastia: -Continue anastrozole 1 mg daily  #) Hypertension: -Continue carvedilol 3.125 mg twice daily - Hold home irbesartan-hydrochlorothiazide 300-2.5 mg daily  #) Type 2 diabetes on oral hypoglycemics: -Sliding scale insulin, before meals at bedtime -Hold home empagliflozin 10mg  daily, metformin 500 mg daily, sitagliptin 100 mg daily, p.o. glitazone 15 mg daily  #) Nonobstructing coronary artery disease: -Continue beta-blocker -Continue rosuvastatin 40 mg daily -Continue aspirin  81 mg daily next line #) Seizure disorder: -Continue levetiracetam 1000 mg daily  #) Sarcoidosis, goodbye neurological and pulmonary involvement: - Hold methotrexate 10 mg weekly on Monday  #) Asthma: -Continue PRN bronchodilators  Fluids: Gentle IV fluids Electrolytes: Monitor and supplement excellent nutrition: N.p.o. pending HIDA  Prophylaxis: Inox apparent  Disposition: Pending evaluation of abdominal pain  Full code  Consultants:   None  Procedures: (Don't include imaging studies which can be auto populated. Include things that cannot be auto populated i.e. Echo, Carotid and venous dopplers, Foley, Bipap, HD, tubes/drains, wound vac, central lines etc)  09/20/2017 HIDA scan: Pending  Antimicrobials: (specify start and planned stop date. Auto populated tables are space occupying and do not give end dates)  None   Subjective: Patient reports he is doing well.  He reports significant severe right upper quadrant abdominal pain that radiates to his back when taking anything by mouth.  He denies any nausea, vomiting, diarrhea at this time.  Objective: Vitals:   09/19/17 1334 09/19/17 2215 09/20/17 0532 09/20/17 1100  BP: 126/67 129/75 (!) 157/98 (!) 148/94  Pulse: 72 74 70 74  Resp: 18 18 18 16   Temp: (!) 97.4 F (36.3 C) 98.7 F (37.1 C) 98.4 F (36.9 C) 98.2 F (36.8 C)  TempSrc: Oral Oral Oral Oral  SpO2: 100% 98% 99% 100%  Weight:   132.5 kg (292 lb 1.8 oz)     Intake/Output Summary (Last 24 hours) at 09/20/2017 1319 Last data filed at 09/20/2017 1000 Gross per 24 hour  Intake 1580 ml  Output 1125 ml  Net 455 ml   Filed Weights   09/20/17 0532  Weight: 132.5 kg (292  lb 1.8 oz)    Examination:  General exam: Appears calm and comfortable  Respiratory system: Clear to auscultation. Respiratory effort normal. Cardiovascular system: Regular rate and rhythm, no murmurs Gastrointestinal system: Abdomen is soft, nondistended, plus bowel sounds, no right  upper quadrant tenderness Central nervous system: Alert and oriented. No focal neurological deficits. Extremities: No lower extremity edema Skin: No rashes on visible skin Psychiatry: Judgement and insight appear normal. Mood & affect appropriate.     Data Reviewed: I have personally reviewed following labs and imaging studies  CBC: Recent Labs  Lab 09/15/17 1438 09/17/17 1124 09/19/17 0500 09/19/17 0910 09/20/17 0422  WBC 12.5* 7.1 5.9 6.5 5.6  NEUTROABS 7.1 2.9 2.0  --   --   HGB 16.7 15.5 15.3 15.1 13.6  HCT 48.4 44.9 43.1 43.0 39.5  MCV 79.6 80.1 77.1* 79.5 79.8  PLT 270.0 282.0 228 254 213   Basic Metabolic Panel: Recent Labs  Lab 09/14/17 0525 09/15/17 1438 09/17/17 1124 09/19/17 0500 09/19/17 0910 09/20/17 0422  NA 136 134* 135 135  --  138  K 3.2* 3.0* 4.0 4.4  --  4.0  CL 93* 91* 94* 99*  --  103  CO2 28 35* 31 26  --  27  GLUCOSE 165* 146* 129* 144*  --  117*  BUN 14 9 9 8   --  9  CREATININE 1.13 0.86 0.96 0.97 0.84 0.85  CALCIUM 9.3 9.9 9.4 9.1  --  8.7*   GFR: Estimated Creatinine Clearance: 162.7 mL/min (by C-G formula based on SCr of 0.85 mg/dL). Liver Function Tests: Recent Labs  Lab 09/15/17 1438 09/17/17 1124 09/19/17 0500 09/20/17 0422  AST 24 23 30 26   ALT 56* 45 45 43  ALKPHOS 81 71 67 56  BILITOT 1.2 1.0 0.3 0.8  PROT 8.3 7.6 7.5 6.3*  ALBUMIN 4.4 4.0 3.9 3.1*   Recent Labs  Lab 09/15/17 1438 09/17/17 1124 09/19/17 0500 09/20/17 0422  LIPASE 19.0 23.0 52* 32  AMYLASE 48 52  --   --    No results for input(s): AMMONIA in the last 168 hours. Coagulation Profile: No results for input(s): INR, PROTIME in the last 168 hours. Cardiac Enzymes: Recent Labs  Lab 09/19/17 0910 09/19/17 1509 09/19/17 2105  TROPONINI <0.03 <0.03 <0.03   BNP (last 3 results) No results for input(s): PROBNP in the last 8760 hours. HbA1C: No results for input(s): HGBA1C in the last 72 hours. CBG: Recent Labs  Lab 09/19/17 1152 09/19/17 1636  09/19/17 2213 09/20/17 0742 09/20/17 1149  GLUCAP 113* 113* 104* 117* 104*   Lipid Profile: Recent Labs    09/19/17 0909  CHOL 176  HDL 34*  LDLCALC 123*  TRIG 97  CHOLHDL 5.2   Thyroid Function Tests: No results for input(s): TSH, T4TOTAL, FREET4, T3FREE, THYROIDAB in the last 72 hours. Anemia Panel: No results for input(s): VITAMINB12, FOLATE, FERRITIN, TIBC, IRON, RETICCTPCT in the last 72 hours. Sepsis Labs: No results for input(s): PROCALCITON, LATICACIDVEN in the last 168 hours.  No results found for this or any previous visit (from the past 240 hour(s)).       Radiology Studies: Dg Chest Port 1 View  Result Date: 09/19/2017 CLINICAL DATA:  Shortness of breath. Heart murmur. Coronary artery disease. EXAM: PORTABLE CHEST 1 VIEW COMPARISON:  09/15/2017 FINDINGS: The heart size and mediastinal contours are within normal limits. Both lungs are clear. The visualized skeletal structures are unremarkable. IMPRESSION: No active disease. Electronically Signed   By: 09/21/2017  Eppie Gibson M.D.   On: 09/19/2017 11:05   US Abdomen Limited Ruq  Result Date: 09/19/2017 CLINICAL DATA:  Abdominal pain since Wednesday, pancreatitis. EXAM: ULTRASOUND ABDOMEN LIMITED RIGHT UPPER QUADRANT COMPARISON:  None. FINDINGS: Gallbladder: Small layering gallstones. No gallbladder wall thickening, pericholecystic fluid, sonographic Murphy's sign or other secondary signs of acute cholecystitis. Common bile duct: Diameter: 4 mm Liver: No focal lesion identified. Diffusely echogenic parenchyma indicating fatty infiltration. Portal vein is patent on color Doppler imaging with normal direction of blood flow towards the liver. IMPRESSION: 1. Cholelithiasis without evidence of acute cholecystitis. 2. No bile duct dilatation. 3. Fatty infiltration of the liver. Electronically Signed   By: Bary Richard M.D.   On: 09/19/2017 14:16        Scheduled Meds: . allopurinol  300 mg Oral Daily  . anastrozole  1 mg Oral  Daily  . aspirin EC  81 mg Oral Daily  . carvedilol  3.125 mg Oral BID WC  . cholecalciferol  1,000 Units Oral Daily  . enoxaparin (LOVENOX) injection  60 mg Subcutaneous Q24H  . folic acid  1 mg Oral Daily  . insulin aspart  0-5 Units Subcutaneous QHS  . insulin aspart  0-9 Units Subcutaneous TID WC  . levETIRAcetam  1,000 mg Oral BID  . rosuvastatin  40 mg Oral Daily  . vitamin B-12  250 mcg Oral Daily   Continuous Infusions: . lactated ringers 150 mL/hr at 09/20/17 1117     LOS: 0 days    Time spent: 35    Delaine Lame, MD [facility]  If 7PM-7AM, please contact night-coverage [email or web address] Password [email or web address] 09/20/2017, 1:19 PM

## 2017-09-20 NOTE — Progress Notes (Signed)
Received report from Dani Gobble.  Patient resting quietly, no signs of distress.  Agree with previous assessment.

## 2017-09-21 ENCOUNTER — Encounter (HOSPITAL_COMMUNITY)
Admission: EM | Disposition: A | Discharge status: Home / Self Care | Payer: Self-pay | Source: Home / Self Care | Attending: Internal Medicine

## 2017-09-21 ENCOUNTER — Inpatient Hospital Stay (HOSPITAL_COMMUNITY): Payer: Medicare Other | Admitting: Anesthesiology

## 2017-09-21 ENCOUNTER — Inpatient Hospital Stay (HOSPITAL_COMMUNITY): Payer: Medicare Other

## 2017-09-21 ENCOUNTER — Encounter (HOSPITAL_COMMUNITY): Payer: Self-pay | Admitting: Student

## 2017-09-21 HISTORY — PX: CHOLECYSTECTOMY: SHX55

## 2017-09-21 LAB — GLUCOSE, CAPILLARY
Glucose-Capillary: 117 mg/dL — ABNORMAL HIGH (ref 65–99)
Glucose-Capillary: 139 mg/dL — ABNORMAL HIGH (ref 65–99)
Glucose-Capillary: 145 mg/dL — ABNORMAL HIGH (ref 65–99)
Glucose-Capillary: 166 mg/dL — ABNORMAL HIGH (ref 65–99)
Glucose-Capillary: 237 mg/dL — ABNORMAL HIGH (ref 65–99)

## 2017-09-21 SURGERY — LAPAROSCOPIC CHOLECYSTECTOMY WITH INTRAOPERATIVE CHOLANGIOGRAM
Anesthesia: General

## 2017-09-21 MED ORDER — DEXAMETHASONE SODIUM PHOSPHATE 10 MG/ML IJ SOLN
INTRAMUSCULAR | Status: AC
  Filled 2017-09-21: qty 1

## 2017-09-21 MED ORDER — KETOROLAC TROMETHAMINE 30 MG/ML IJ SOLN
INTRAMUSCULAR | Status: AC
  Filled 2017-09-21: qty 1

## 2017-09-21 MED ORDER — ROCURONIUM BROMIDE 10 MG/ML (PF) SYRINGE
PREFILLED_SYRINGE | INTRAVENOUS | Status: AC
  Filled 2017-09-21: qty 5

## 2017-09-21 MED ORDER — FENTANYL CITRATE (PF) 100 MCG/2ML IJ SOLN
INTRAMUSCULAR | Status: DC | PRN
  Administered 2017-09-21: 150 ug via INTRAVENOUS
  Administered 2017-09-21 (×2): 50 ug via INTRAVENOUS

## 2017-09-21 MED ORDER — LACTATED RINGERS IV SOLN
INTRAVENOUS | Status: DC
Start: 2017-09-21 — End: 2017-09-21
  Administered 2017-09-21: 12:00:00 via INTRAVENOUS

## 2017-09-21 MED ORDER — GLYCOPYRROLATE 0.2 MG/ML IV SOSY
PREFILLED_SYRINGE | INTRAVENOUS | Status: DC | PRN
  Administered 2017-09-21: .2 mg via INTRAVENOUS

## 2017-09-21 MED ORDER — DEXAMETHASONE SODIUM PHOSPHATE 10 MG/ML IJ SOLN
INTRAMUSCULAR | Status: DC | PRN
  Administered 2017-09-21: 10 mg via INTRAVENOUS

## 2017-09-21 MED ORDER — SUGAMMADEX SODIUM 500 MG/5ML IV SOLN
INTRAVENOUS | Status: AC
  Filled 2017-09-21: qty 5

## 2017-09-21 MED ORDER — FENTANYL CITRATE (PF) 250 MCG/5ML IJ SOLN
INTRAMUSCULAR | Status: AC
Start: 2017-09-21 — End: 2017-09-21
  Filled 2017-09-21: qty 5

## 2017-09-21 MED ORDER — LIDOCAINE 2% (20 MG/ML) 5 ML SYRINGE
INTRAMUSCULAR | Status: AC
  Filled 2017-09-21: qty 5

## 2017-09-21 MED ORDER — ROCURONIUM BROMIDE 10 MG/ML (PF) SYRINGE
PREFILLED_SYRINGE | INTRAVENOUS | Status: DC | PRN
  Administered 2017-09-21: 10 mg via INTRAVENOUS
  Administered 2017-09-21: 60 mg via INTRAVENOUS

## 2017-09-21 MED ORDER — GLYCOPYRROLATE 0.2 MG/ML IV SOSY
PREFILLED_SYRINGE | INTRAVENOUS | Status: AC
  Filled 2017-09-21: qty 5

## 2017-09-21 MED ORDER — PROPOFOL 10 MG/ML IV BOLUS
INTRAVENOUS | Status: DC | PRN
  Administered 2017-09-21: 200 mg via INTRAVENOUS

## 2017-09-21 MED ORDER — PROMETHAZINE HCL 25 MG/ML IJ SOLN: 6.2500 mg | INTRAMUSCULAR | Status: DC | PRN

## 2017-09-21 MED ORDER — KETOROLAC TROMETHAMINE 30 MG/ML IJ SOLN
30.0000 mg | Freq: Once | INTRAMUSCULAR | Status: AC | PRN
  Administered 2017-09-21: 30 mg via INTRAVENOUS

## 2017-09-21 MED ORDER — 0.9 % SODIUM CHLORIDE (POUR BTL) OPTIME
TOPICAL | Status: DC | PRN
Start: 2017-09-21 — End: 2017-09-21
  Administered 2017-09-21: 1000 mL

## 2017-09-21 MED ORDER — LIDOCAINE 2% (20 MG/ML) 5 ML SYRINGE
INTRAMUSCULAR | Status: DC | PRN
  Administered 2017-09-21: 80 mg via INTRAVENOUS

## 2017-09-21 MED ORDER — MIDAZOLAM HCL 2 MG/2ML IJ SOLN
INTRAMUSCULAR | Status: AC
  Filled 2017-09-21: qty 2

## 2017-09-21 MED ORDER — MIDAZOLAM HCL 5 MG/5ML IJ SOLN
INTRAMUSCULAR | Status: DC | PRN
  Administered 2017-09-21: 2 mg via INTRAVENOUS

## 2017-09-21 MED ORDER — HYDROMORPHONE HCL 1 MG/ML IJ SOLN
INTRAMUSCULAR | Status: AC
  Filled 2017-09-21: qty 1

## 2017-09-21 MED ORDER — SUGAMMADEX SODIUM 500 MG/5ML IV SOLN
INTRAVENOUS | Status: DC | PRN
  Administered 2017-09-21: 500 mg via INTRAVENOUS

## 2017-09-21 MED ORDER — IOPAMIDOL (ISOVUE-300) INJECTION 61%
INTRAVENOUS | Status: AC
  Filled 2017-09-21: qty 50

## 2017-09-21 MED ORDER — HYDROMORPHONE HCL 1 MG/ML IJ SOLN
0.2500 mg | INTRAMUSCULAR | Status: DC | PRN
  Administered 2017-09-21 (×4): 0.5 mg via INTRAVENOUS

## 2017-09-21 MED ORDER — IOPAMIDOL (ISOVUE-300) INJECTION 61%
INTRAVENOUS | Status: DC | PRN
  Administered 2017-09-21: 4 mL

## 2017-09-21 MED ORDER — BUPIVACAINE-EPINEPHRINE (PF) 0.5% -1:200000 IJ SOLN
INTRAMUSCULAR | Status: DC | PRN
  Administered 2017-09-21: 24 mL via PERINEURAL

## 2017-09-21 MED ORDER — LACTATED RINGERS IV SOLN
INTRAVENOUS | Status: AC | PRN
  Administered 2017-09-21: 1000 mL

## 2017-09-21 MED ORDER — PROPOFOL 10 MG/ML IV BOLUS
INTRAVENOUS | Status: AC
  Filled 2017-09-21: qty 20

## 2017-09-21 MED ORDER — BUPIVACAINE-EPINEPHRINE (PF) 0.5% -1:200000 IJ SOLN
INTRAMUSCULAR | Status: AC
  Filled 2017-09-21: qty 30

## 2017-09-21 MED ORDER — ONDANSETRON HCL 4 MG/2ML IJ SOLN
INTRAMUSCULAR | Status: AC
  Filled 2017-09-21: qty 2

## 2017-09-21 SURGICAL SUPPLY — 32 items
APPLIER CLIP ROT 10 11.4 M/L (STAPLE) ×2
CABLE HIGH FREQUENCY MONO STRZ (ELECTRODE) ×2 IMPLANT
CATH REDDICK CHOLANGI 4FR 50CM (CATHETERS) IMPLANT
CHLORAPREP W/TINT 26ML (MISCELLANEOUS) ×2 IMPLANT
CLIP APPLIE ROT 10 11.4 M/L (STAPLE) ×1 IMPLANT
COVER MAYO STAND STRL (DRAPES) ×2 IMPLANT
COVER SURGICAL LIGHT HANDLE (MISCELLANEOUS) ×2 IMPLANT
DECANTER SPIKE VIAL GLASS SM (MISCELLANEOUS) ×2 IMPLANT
DERMABOND ADVANCED (GAUZE/BANDAGES/DRESSINGS) ×1
DERMABOND ADVANCED .7 DNX12 (GAUZE/BANDAGES/DRESSINGS) ×1 IMPLANT
DRAPE C-ARM 42X120 X-RAY (DRAPES) ×2 IMPLANT
ELECT REM PT RETURN 15FT ADLT (MISCELLANEOUS) ×2 IMPLANT
GLOVE BIOGEL PI IND STRL 7.5 (GLOVE) ×1 IMPLANT
GLOVE BIOGEL PI INDICATOR 7.5 (GLOVE) ×1
GLOVE ECLIPSE 7.5 STRL STRAW (GLOVE) ×2 IMPLANT
GOWN STRL REUS W/TWL XL LVL3 (GOWN DISPOSABLE) ×6 IMPLANT
HEMOSTAT SNOW SURGICEL 2X4 (HEMOSTASIS) IMPLANT
HEMOSTAT SURGICEL 4X8 (HEMOSTASIS) IMPLANT
KIT BASIN OR (CUSTOM PROCEDURE TRAY) ×2 IMPLANT
POUCH RETRIEVAL ECOSAC 10 (ENDOMECHANICALS) ×1 IMPLANT
POUCH RETRIEVAL ECOSAC 10MM (ENDOMECHANICALS) ×1
SCISSORS LAP 5X35 DISP (ENDOMECHANICALS) ×2 IMPLANT
SET CHOLANGIOGRAPH MIX (MISCELLANEOUS) ×2 IMPLANT
SET IRRIG TUBING LAPAROSCOPIC (IRRIGATION / IRRIGATOR) ×2 IMPLANT
SLEEVE XCEL OPT CAN 5 100 (ENDOMECHANICALS) ×2 IMPLANT
SUT MNCRL AB 4-0 PS2 18 (SUTURE) ×2 IMPLANT
TOWEL OR 17X26 10 PK STRL BLUE (TOWEL DISPOSABLE) ×2 IMPLANT
TRAY LAPAROSCOPIC (CUSTOM PROCEDURE TRAY) ×2 IMPLANT
TROCAR BLADELESS OPT 5 100 (ENDOMECHANICALS) ×2 IMPLANT
TROCAR XCEL BLUNT TIP 100MML (ENDOMECHANICALS) ×2 IMPLANT
TROCAR XCEL NON-BLD 11X100MML (ENDOMECHANICALS) ×2 IMPLANT
TUBING INSUF HEATED (TUBING) ×2 IMPLANT

## 2017-09-21 NOTE — Anesthesia Postprocedure Evaluation (Signed)
Anesthesia Post Note  Patient: [name]  Procedure(s) Performed: LAPAROSCOPIC CHOLECYSTECTOMY WITH INTRAOPERATIVE CHOLANGIOGRAM (N/A )     Patient location during evaluation: PACU Anesthesia Type: General Level of consciousness: awake and alert Pain management: pain level controlled Vital Signs Assessment: post-procedure vital signs reviewed and stable Respiratory status: spontaneous breathing, nonlabored ventilation, respiratory function stable and patient connected to nasal cannula oxygen Cardiovascular status: blood pressure returned to baseline and stable Postop Assessment: no apparent nausea or vomiting Anesthetic complications: no    Last Vitals:  Vitals:   09/21/17 1445 09/21/17 1500  BP: (!) 142/84 (!) 142/82  Pulse: 85 86  Resp: 19 19  Temp:    SpO2: 99% 98%    Last Pain:  Vitals:   09/21/17 1520  TempSrc:   PainSc: 7                  Herron Fero S

## 2017-09-21 NOTE — Anesthesia Preprocedure Evaluation (Signed)
Anesthesia Evaluation  Patient identified by MRN, date of birth, ID band Patient awake    Reviewed: Allergy & Precautions, NPO status , Patient's Chart, lab work & pertinent test results  Airway Mallampati: II  TM Distance: <3 FB Neck ROM: Full    Dental no notable dental hx.    Pulmonary former smoker,  PULMONARY SARCOIDOSIS  Mediastinal lymphadenopathy with biospy proven sarcodosis    Pulmonary exam normal breath sounds clear to auscultation       Cardiovascular hypertension, Normal cardiovascular exam Rhythm:Regular Rate:Normal     Neuro/Psych TIAnegative psych ROS   GI/Hepatic negative GI ROS, Neg liver ROS,   Endo/Other  diabetesMorbid obesity  Renal/GU negative Renal ROS  negative genitourinary   Musculoskeletal negative musculoskeletal ROS (+)   Abdominal   Peds negative pediatric ROS (+)  Hematology negative hematology ROS (+)   Anesthesia Other Findings   Reproductive/Obstetrics negative OB ROS                             Anesthesia Physical Anesthesia Plan  ASA: III  Anesthesia Plan: General   Post-op Pain Management:    Induction: Intravenous  PONV Risk Score and Plan: 2 and Ondansetron, Treatment may vary due to age or medical condition and Midazolam  Airway Management Planned: Oral ETT  Additional Equipment:   Intra-op Plan:   Post-operative Plan: Extubation in OR  Informed Consent: I have reviewed the patients History and Physical, chart, labs and discussed the procedure including the risks, benefits and alternatives for the proposed anesthesia with the patient or authorized representative who has indicated his/her understanding and acceptance.   Dental advisory given  Plan Discussed with: CRNA and Surgeon  Anesthesia Plan Comments:         Anesthesia Quick Evaluation

## 2017-09-21 NOTE — Anesthesia Procedure Notes (Signed)
Procedure Name: Intubation Date/Time: 09/21/2017 1:10 PM Performed by: Lavina Hamman, CRNA Pre-anesthesia Checklist: Patient identified, Emergency Drugs available, Suction available, Patient being monitored and Timeout performed Patient Re-evaluated:Patient Re-evaluated prior to induction Oxygen Delivery Method: Circle system utilized Preoxygenation: Pre-oxygenation with 100% oxygen Induction Type: IV induction Ventilation: Mask ventilation without difficulty Laryngoscope Size: Mac and 4 Grade View: Grade II Tube type: Oral Tube size: 7.5 mm Number of attempts: 1 Airway Equipment and Method: Stylet Placement Confirmation: ETT inserted through vocal cords under direct vision,  positive ETCO2,  CO2 detector and breath sounds checked- equal and bilateral Secured at: 23 cm Tube secured with: Tape Dental Injury: Teeth and Oropharynx as per pre-operative assessment

## 2017-09-21 NOTE — Transfer of Care (Signed)
Immediate Anesthesia Transfer of Care Note  Patient: [name]  Procedure(s) Performed: LAPAROSCOPIC CHOLECYSTECTOMY WITH INTRAOPERATIVE CHOLANGIOGRAM (N/A )  Patient Location: PACU  Anesthesia Type:General  Level of Consciousness: awake, alert  and oriented  Airway & Oxygen Therapy: Patient Spontanous Breathing and Patient connected to face mask oxygen  Post-op Assessment: Report given to RN  Post vital signs: Reviewed and stable  Last Vitals:  Vitals Value Taken Time  BP 154/78 09/21/2017  2:30 PM  Temp    Pulse 85 09/21/2017  2:32 PM  Resp 24 09/21/2017  2:32 PM  SpO2 100 % 09/21/2017  2:32 PM  Vitals shown include unvalidated device data.  Last Pain:  Vitals:   09/21/17 0836  TempSrc:   PainSc: 8       Patients Stated Pain Goal: 0 (09/21/17 0755)  Complications: No apparent anesthesia complications

## 2017-09-21 NOTE — Op Note (Signed)
Preoperative diagnosis: Cholelithiasis and cholecystitis  Postoperative diagnosis: Cholelithiasis and cholecystitis  Surgical procedure: Laparoscopic cholecystectomy with intraoperative cholangiogram  Surgeon: Sharlet Salina T. Aryaa Bunting M.D.  Assistant: None  Anesthesia: General Endotracheal  Complications: None  Estimated blood loss: Minimal  Description of procedure: The patient brought to the operating room, placed in the supine position on the operating table, and general endotracheal anesthesia induced. The abdomen was widely sterilely prepped and draped. The patient had received preoperative IV antibiotics and PAS were in place. Patient timeout was performed the correct procedure verified. Standard 4 port technique was used with an open Hassan cannula at the umbilicus and the remainder of the ports placed under direct vision. The gallbladder was visualized. It appeared edematous with early acute inflammation. The fundus was grasped and elevated up over the liver and the infundibulum retracted inferiolaterally. Peritoneum anterior and posterior to close triangle was incised and fibrofatty tissue stripped off the neck of the gallbladder toward the porta hepatis. The distal gallbladder was thoroughly dissected. The cystic artery was identified in Calot's triangle and the cystic duct gallbladder junction dissected 360.  A good critical view was obtained. When the anatomy was clear the cystic duct was clipped at the gallbladder junction and an operative cholangiogram obtained through the cystic duct. This showed good filling of a normal common bile duct and intrahepatic ducts with free flow into the duodenum and no filling defects. Following this the cholangiocath was removed and the cystic duct was doubly clipped proximally and divided. The cystic artery was doubly clipped proximally and distally and divided. The gallbladder was dissected free from its bed using hook cautery and removed through the  umbilical port site. Complete hemostasis was obtained in the gallbladder bed. The right upper quadrant was thoroughly irrigated and hemostasis assured. Trochars were removed and all CO2 evacuated and the [facility] trocar site fascial defect closed. Skin incisions were closed with subcuticular Monocryl and Dermabond. Sponge needle and instrument counts were correct. The patient was taken to PACU in good condition.  Lorne Skeens Suren Payne  09/21/2017

## 2017-09-21 NOTE — Progress Notes (Signed)
[facility]/Trauma Progress Note      Assessment/Plan CAD DM type II GERD Gout HTN Narcolepsy Pulmonary sarcoidosis Seizures TIA  Cholelithiasis, bilary cholic  - US showed Cholelithiasis without evidence of acute cholecystitis - HIDA showed Patent cystic duct although the gallbladder fills in a somewhat delayed fashion likely related consistent with abnormal gallbladder function - LFT's WNL - OR today for lap chole with IOC  FEN: NPO VTE: SCD's, lovenox ID: Ancef pre-op Foley:  Follow up: TBD  DISPO: OR today for lap chole with IOC    LOS: 1 day    Subjective: CC: abdominal pain  Pain unchanged, not worse. No acute events overnight. No nausea, vomiting, fever, or chills. No family at bedside. Discussed timing of OR.   Objective: Vital signs in last 24 hours: Temp:  [97.6 F (36.4 C)-98.6 F (37 C)] 98.6 F (37 C) (04/16 0623) Pulse Rate:  [55-82] 55 (04/16 0623) Resp:  [16-18] 18 (04/16 0623) BP: (137-170)/(84-102) 153/91 (04/16 0623) SpO2:  [98 %-100 %] 100 % (04/16 0623) Weight:  [132.5 kg (292 lb 1.8 oz)] 132.5 kg (292 lb 1.8 oz) (04/15 1100) Last BM Date: 09/13/17  Intake/Output from previous day: 04/15 0701 - 04/16 0700 In: 3175 [P.O.:480; I.V.:2695] Out: 1175 [Urine:1175] Intake/Output this shift: No intake/output data recorded.  PE: Gen:  Alert, NAD, pleasant, cooperative Card:  RRR, no M/G/R heard Pulm:  CTA, no W/R/R, effort normal Abd: Soft, ND, +BS, no HSM, mild TTP in RUQ, no guarding Skin: no rashes noted, warm and dry   Anti-infectives: Anti-infectives (From admission, onward)   Start     Dose/Rate Route Frequency Ordered Stop   09/21/17 1100  ceFAZolin (ANCEF) 3 g in dextrose 5 % 50 mL IVPB     3 g 130 mL/hr over 30 Minutes Intravenous To Surgery 09/20/17 1734 09/22/17 1100      Lab Results:  Recent Labs    09/19/17 0910 09/20/17 0422  WBC 6.5 5.6  HGB 15.1 13.6  HCT 43.0 39.5  PLT 254 213    BMET Recent Labs    09/19/17 0500 09/19/17 0910 09/20/17 0422  NA 135  --  138  K 4.4  --  4.0  CL 99*  --  103  CO2 26  --  27  GLUCOSE 144*  --  117*  BUN 8  --  9  CREATININE 0.97 0.84 0.85  CALCIUM 9.1  --  8.7*   PT/INR No results for input(s): LABPROT, INR in the last 72 hours. CMP     Component Value Date/Time   NA 138 09/20/2017 0422   NA 141 06/17/2016 0745   K 4.0 09/20/2017 0422   CL 103 09/20/2017 0422   CO2 27 09/20/2017 0422   GLUCOSE 117 (H) 09/20/2017 0422   BUN 9 09/20/2017 0422   BUN 11 06/17/2016 0745   CREATININE 0.85 09/20/2017 0422   CREATININE 0.91 04/07/2016 1529   CALCIUM 8.7 (L) 09/20/2017 0422   PROT 6.3 (L) 09/20/2017 0422   ALBUMIN 3.1 (L) 09/20/2017 0422   AST 26 09/20/2017 0422   ALT 43 09/20/2017 0422   ALKPHOS 56 09/20/2017 0422   BILITOT 0.8 09/20/2017 0422   GFRNONAA >60 09/20/2017 0422   GFRAA >60 09/20/2017 0422   Lipase     Component Value Date/Time   LIPASE 32 09/20/2017 0422    Studies/Results: Nm Hepato W/eject Fract  Result Date: 09/20/2017 CLINICAL DATA:  Right upper quadrant pain with nausea and vomiting EXAM: NUCLEAR  MEDICINE HEPATOBILIARY IMAGING TECHNIQUE: Sequential images of the abdomen were obtained out to 60 minutes following intravenous administration of radiopharmaceutical. RADIOPHARMACEUTICALS:  5.3 mCi Tc-39m  Choletec IV COMPARISON:  Ultrasound from 09/19/2017 FINDINGS: There is prompt uptake of biliary tracer throughout the liver. Visualization of the biliary tree with opacification of the small bowel is noted. No significant gallbladder opacification is noted. 5.3 mg of morphine was then administered intravenously and repeat imaging was obtained. On significant delayed images following morphine administration there was filling within the gallbladder. IMPRESSION: Patent cystic duct although the gallbladder fills in a somewhat delayed fashion likely related consistent with abnormal gallbladder function. This  may represent some chronic cholecystitis. No biliary obstruction is seen. Electronically Signed   By: Alcide Clever M.D.   On: 09/20/2017 15:39   Dg Chest Port 1 View  Result Date: 09/19/2017 CLINICAL DATA:  Shortness of breath. Heart murmur. Coronary artery disease. EXAM: PORTABLE CHEST 1 VIEW COMPARISON:  09/15/2017 FINDINGS: The heart size and mediastinal contours are within normal limits. Both lungs are clear. The visualized skeletal structures are unremarkable. IMPRESSION: No active disease. Electronically Signed   By: Myles Rosenthal M.D.   On: 09/19/2017 11:05   US Abdomen Limited Ruq  Result Date: 09/19/2017 CLINICAL DATA:  Abdominal pain since Wednesday, pancreatitis. EXAM: ULTRASOUND ABDOMEN LIMITED RIGHT UPPER QUADRANT COMPARISON:  None. FINDINGS: Gallbladder: Small layering gallstones. No gallbladder wall thickening, pericholecystic fluid, sonographic Murphy's sign or other secondary signs of acute cholecystitis. Common bile duct: Diameter: 4 mm Liver: No focal lesion identified. Diffusely echogenic parenchyma indicating fatty infiltration. Portal vein is patent on color Doppler imaging with normal direction of blood flow towards the liver. IMPRESSION: 1. Cholelithiasis without evidence of acute cholecystitis. 2. No bile duct dilatation. 3. Fatty infiltration of the liver. Electronically Signed   By: Bary Richard M.D.   On: 09/19/2017 14:16      Jerre Simon , [facility] 09/21/2017, 7:54 AM  Pager: [phone] Mon-Wed, Friday 7:00am-4:30pm Thurs 7am-11:30am  Consults: [phone]

## 2017-09-21 NOTE — Progress Notes (Signed)
PROGRESS NOTE    [name]  WER:[id number] DOB: 01-30-1971 DOA: 09/19/2017 PCP: [name], MD    Brief Narrative:   47 year old with past medical history relevant for type 2 diabetes on oral hypoglycemics, hypertension, sarcoidosis with neurological and pulmonary involvement, gated by seizures, gynecomastia, nonobstructive coronary artery disease, gout, asthma who presented with abdominal pain after ED visit on 09/15/2017 with diagnosis of mild pancreatitis.   Assessment & Plan:   Principal Problem:   Right upper quadrant abdominal pain Active Problems:   Diabetes mellitus type 2 in obese (HCC)   Gout   Essential hypertension   CAD (coronary artery disease)   GERD   Sarcoidosis (HCC)   Acute pancreatitis   Pancreatitis   #) Biliary colic: Patient's abdominal pain is likely thought to be secondary to biliary colic with abnormal HIDA scan.  His prior visit on 09/15/2017 to the emergency department with mild pancreatitis could possibly reflect a small stone that was passing. -HIDA scan, mildly abnormal -ToOR today for lap scopic cholecystectomy and cholangiogram  #) Gout: -Continue allopurinol 300 mg daily -Continue PRN colchicine 0.6 mg every 4 hours as needed  #) Gynecomastia: -Continue anastrozole 1 mg daily  #) Hypertension: -Continue carvedilol 3.125 mg twice daily - Hold home irbesartan-hydrochlorothiazide 300-2.5 mg daily  #) Type 2 diabetes on oral hypoglycemics: -Sliding scale insulin, before meals at bedtime -Hold home empagliflozin 10mg  daily, metformin 500 mg daily, sitagliptin 100 mg daily, p.o. glitazone 15 mg daily  #) Nonobstructing coronary artery disease: -Continue beta-blocker -Continue rosuvastatin 40 mg daily -Continue aspirin 81 mg daily   #) Seizure disorder: -Continue levetiracetam 1000 mg daily  #) Sarcoidosis, complicated by neurological and pulmonary involvement: - Hold methotrexate 10 mg weekly on Monday  #)  Asthma: -Continue PRN bronchodilators  Fluids: Gentle IV fluids Electrolytes: Monitor and supplement excellent nutrition: N.p.o. pending OR  Prophylaxis: Enoxaparin  Disposition: Pending lap scopic cholecystectomy and cholangiogram and discharge likely tomorrow  Full code  Consultants:   General surgery  Procedures: (Don't include imaging studies which can be auto populated. Include things that cannot be auto populated i.e. Echo, Carotid and venous dopplers, Foley, Bipap, HD, tubes/drains, wound vac, central lines etc)  09/19/2017 right upper quadrant ultrasound: 1. Cholelithiasis without evidence of acute cholecystitis. 2. No bile duct dilatation. 3. Fatty infiltration of the liver.   09/20/2017 HIDA scan: Patent cystic duct although the gallbladder fills in a somewhat delayed fashion likely related consistent with abnormal gallbladder  function. This may represent some chronic cholecystitis.  Antimicrobials: (specify start and planned stop date. Auto populated tables are space occupying and do not give end dates)  None   Subjective: Patient reports he is doing well.  He reports some pain last night that was postprandial.  He is eager to get the surgery done and over with.  He currently denies any abdominal pain, nausea, vomiting, diarrhea.  Objective: Vitals:   09/20/17 2158 09/21/17 0301 09/21/17 0623 09/21/17 0755  BP: (!) 170/102 137/84 (!) 153/91 (!) 144/90  Pulse: 73 69 (!) 55 61  Resp: 18 18 18 18   Temp: 98 F (36.7 C) 97.6 F (36.4 C) 98.6 F (37 C) 98.2 F (36.8 C)  TempSrc: Oral Oral Oral Oral  SpO2: 98% 99% 100% 100%  Weight:      Height:        Intake/Output Summary (Last 24 hours) at 09/21/2017 1336 Last data filed at 09/21/2017 0803 Gross per 24 hour  Intake 2457.5 ml  Output  900 ml  Net 1557.5 ml   Filed Weights   09/20/17 0532 09/20/17 1100  Weight: 132.5 kg (292 lb 1.8 oz) 132.5 kg (292 lb 1.8 oz)    Examination:  General exam:  Appears calm and comfortable  Respiratory system: Clear to auscultation. Respiratory effort normal. Cardiovascular system: Regular rate and rhythm, no murmurs Gastrointestinal system: Abdomen is soft, nondistended, plus bowel sounds, mild right upper quadrant tenderness, no rebound or guarding Central nervous system: Alert and oriented. No focal neurological deficits. Extremities: No lower extremity edema Skin: No rashes on visible skin Psychiatry: Judgement and insight appear normal. Mood & affect appropriate.     Data Reviewed: I have personally reviewed following labs and imaging studies  CBC: Recent Labs  Lab 09/15/17 1438 09/17/17 1124 09/19/17 0500 09/19/17 0910 09/20/17 0422  WBC 12.5* 7.1 5.9 6.5 5.6  NEUTROABS 7.1 2.9 2.0  --   --   HGB 16.7 15.5 15.3 15.1 13.6  HCT 48.4 44.9 43.1 43.0 39.5  MCV 79.6 80.1 77.1* 79.5 79.8  PLT 270.0 282.0 228 254 213   Basic Metabolic Panel: Recent Labs  Lab 09/15/17 1438 09/17/17 1124 09/19/17 0500 09/19/17 0910 09/20/17 0422  NA 134* 135 135  --  138  K 3.0* 4.0 4.4  --  4.0  CL 91* 94* 99*  --  103  CO2 35* 31 26  --  27  GLUCOSE 146* 129* 144*  --  117*  BUN 9 9 8   --  9  CREATININE 0.86 0.96 0.97 0.84 0.85  CALCIUM 9.9 9.4 9.1  --  8.7*   GFR: Estimated Creatinine Clearance: 162.7 mL/min (by C-G formula based on SCr of 0.85 mg/dL). Liver Function Tests: Recent Labs  Lab 09/15/17 1438 09/17/17 1124 09/19/17 0500 09/20/17 0422  AST 24 23 30 26   ALT 56* 45 45 43  ALKPHOS 81 71 67 56  BILITOT 1.2 1.0 0.3 0.8  PROT 8.3 7.6 7.5 6.3*  ALBUMIN 4.4 4.0 3.9 3.1*   Recent Labs  Lab 09/15/17 1438 09/17/17 1124 09/19/17 0500 09/20/17 0422  LIPASE 19.0 23.0 52* 32  AMYLASE 48 52  --   --    No results for input(s): AMMONIA in the last 168 hours. Coagulation Profile: No results for input(s): INR, PROTIME in the last 168 hours. Cardiac Enzymes: Recent Labs  Lab 09/19/17 0910 09/19/17 1509 09/19/17 2105   TROPONINI <0.03 <0.03 <0.03   BNP (last 3 results) No results for input(s): PROBNP in the last 8760 hours. HbA1C: No results for input(s): HGBA1C in the last 72 hours. CBG: Recent Labs  Lab 09/20/17 1149 09/20/17 1708 09/20/17 2153 09/21/17 0802 09/21/17 1147  GLUCAP 104* 108* 181* 117* 139*   Lipid Profile: Recent Labs    09/19/17 0909  CHOL 176  HDL 34*  LDLCALC 123*  TRIG 97  CHOLHDL 5.2   Thyroid Function Tests: No results for input(s): TSH, T4TOTAL, FREET4, T3FREE, THYROIDAB in the last 72 hours. Anemia Panel: No results for input(s): VITAMINB12, FOLATE, FERRITIN, TIBC, IRON, RETICCTPCT in the last 72 hours. Sepsis Labs: No results for input(s): PROCALCITON, LATICACIDVEN in the last 168 hours.  Recent Results (from the past 240 hour(s))  Surgical pcr screen     Status: None   Collection Time: 09/20/17  6:27 PM  Result Value Ref Range Status   MRSA, PCR NEGATIVE NEGATIVE Final   Staphylococcus aureus NEGATIVE NEGATIVE Final    Comment: (NOTE) The Xpert SA Assay (FDA approved for NASAL specimens in  patients 57 years of age and older), is one component of a comprehensive surveillance program. It is not intended to diagnose infection nor to guide or monitor treatment. Performed at [facility], [address] [place], [place] [address]          Radiology Studies: Nm Hepato W/eject Fract  Result Date: 09/20/2017 CLINICAL DATA:  Right upper quadrant pain with nausea and vomiting EXAM: NUCLEAR MEDICINE HEPATOBILIARY IMAGING TECHNIQUE: Sequential images of the abdomen were obtained out to 60 minutes following intravenous administration of radiopharmaceutical. RADIOPHARMACEUTICALS:  5.3 mCi Tc-53m  Choletec IV COMPARISON:  Ultrasound from 09/19/2017 FINDINGS: There is prompt uptake of biliary tracer throughout the liver. Visualization of the biliary tree with opacification of the small bowel is noted. No significant gallbladder opacification  is noted. 5.3 mg of morphine was then administered intravenously and repeat imaging was obtained. On significant delayed images following morphine administration there was filling within the gallbladder. IMPRESSION: Patent cystic duct although the gallbladder fills in a somewhat delayed fashion likely related consistent with abnormal gallbladder function. This may represent some chronic cholecystitis. No biliary obstruction is seen. Electronically Signed   By: Alcide Clever M.D.   On: 09/20/2017 15:39        Scheduled Meds: . [MAR Hold] allopurinol  300 mg Oral Daily  . [MAR Hold] anastrozole  1 mg Oral Daily  . [MAR Hold] aspirin EC  81 mg Oral Daily  . [MAR Hold] carvedilol  3.125 mg Oral BID WC  . [MAR Hold] cholecalciferol  1,000 Units Oral Daily  . [MAR Hold] enoxaparin (LOVENOX) injection  60 mg Subcutaneous Q24H  . [MAR Hold] folic acid  1 mg Oral Daily  . [MAR Hold] insulin aspart  0-5 Units Subcutaneous QHS  . [MAR Hold] insulin aspart  0-9 Units Subcutaneous TID WC  . [MAR Hold] levETIRAcetam  1,000 mg Oral BID  . [MAR Hold] rosuvastatin  40 mg Oral Daily  . [MAR Hold] vitamin B-12  250 mcg Oral Daily   Continuous Infusions: . lactated ringers 1,000 mL (09/21/17 0257)  . lactated ringers 75 mL/hr at 09/21/17 1214  . lactated ringers       LOS: 1 day    Time spent: 35    Delaine Lame, MD [facility]  If 7PM-7AM, please contact night-coverage [email or web address] Password [email or web address] 09/21/2017, 1:36 PM

## 2017-09-22 DIAGNOSIS — R1011 Right upper quadrant pain: Secondary | ICD-10-CM

## 2017-09-22 LAB — BASIC METABOLIC PANEL
Anion gap: 10 (ref 5–15)
BUN: 7 mg/dL (ref 6–20)
CO2: 22 mmol/L (ref 22–32)
Calcium: 9 mg/dL (ref 8.9–10.3)
Chloride: 105 mmol/L (ref 101–111)
Creatinine, Ser: 0.69 mg/dL (ref 0.61–1.24)
GFR calc Af Amer: >60 mL/min (ref >60)
GFR calc non Af Amer: >60 mL/min (ref >60)
Glucose, Bld: 169 mg/dL — ABNORMAL HIGH (ref 65–99)
Potassium: 4.1 mmol/L (ref 3.5–5.1)
Sodium: 137 mmol/L (ref 135–145)

## 2017-09-22 LAB — CBC
HCT: 40.2 % (ref 39.0–52.0)
Hemoglobin: 13.8 g/dL (ref 13.0–17.0)
MCH: 26.8 pg (ref 26.0–34.0)
MCHC: 34.3 g/dL (ref 30.0–36.0)
MCV: 78.1 fL (ref 78.0–100.0)
Platelets: 244 10*3/uL (ref 150–400)
RBC: 5.15 MIL/uL (ref 4.22–5.81)
RDW: 14.2 % (ref 11.5–15.5)
WBC: 6.4 10*3/uL (ref 4.0–10.5)

## 2017-09-22 LAB — GLUCOSE, CAPILLARY: Glucose-Capillary: 150 mg/dL — ABNORMAL HIGH (ref 65–99)

## 2017-09-22 MED ORDER — IRBESARTAN-HYDROCHLOROTHIAZIDE 300-12.5 MG PO TABS: 1.0000 | ORAL_TABLET | Freq: Every day | ORAL | Status: DC

## 2017-09-22 MED ORDER — HYDROCODONE-ACETAMINOPHEN 5-325 MG PO TABS: 1.0000 | ORAL_TABLET | Freq: Four times a day (QID) | ORAL | 0 refills | Status: DC | PRN

## 2017-09-22 MED ORDER — SENNOSIDES-DOCUSATE SODIUM 8.6-50 MG PO TABS: 1.0000 | ORAL_TABLET | Freq: Every day | ORAL | 0 refills | Status: DC

## 2017-09-22 MED ORDER — IRBESARTAN 300 MG PO TABS
300.0000 mg | ORAL_TABLET | Freq: Every day | ORAL | Status: DC
  Administered 2017-09-22: 300 mg via ORAL
  Filled 2017-09-22: qty 1

## 2017-09-22 MED ORDER — SENNOSIDES-DOCUSATE SODIUM 8.6-50 MG PO TABS: 1.0000 | ORAL_TABLET | Freq: Two times a day (BID) | ORAL | Status: DC

## 2017-09-22 MED ORDER — HYDROCHLOROTHIAZIDE 12.5 MG PO CAPS: 12.5000 mg | ORAL_CAPSULE | Freq: Every day | ORAL | Status: DC

## 2017-09-22 NOTE — Discharge Instructions (Signed)

## 2017-09-22 NOTE — Progress Notes (Signed)
Discharge instructions reviewed with patient and patient verbalizes understanding.  VS stable, patient in no apparent distress at time of discharge.  Transported to personal vehicle with family by NT.

## 2017-09-22 NOTE — Discharge Summary (Signed)
Discharge Summary  [name] KXF:[id number] DOB: 12-04-1970  PCP: Janith Lima, MD  Admit date: 09/19/2017 Discharge date: 09/22/2017  Time spent: <46mns  Recommendations for Outpatient Follow-up:  1. F/u with PMD within a week  for hospital discharge follow up, repeat cbc/bmp at follow up. 2. F/u with general surgery in two weeks for post op follow up  Discharge Diagnoses:  Active Hospital Problems   Diagnosis Date Noted  . Right upper quadrant abdominal pain   . Pancreatitis 09/19/2017  . Acute pancreatitis 09/18/2017  . Sarcoidosis ([facility] 09/29/2010  . GERD 01/07/2010  . Diabetes mellitus type 2 in obese ([facility] 10/07/2009  . Gout 10/07/2009  . Essential hypertension 10/07/2009  . CAD (coronary artery disease) 10/07/2009    Resolved Hospital Problems  No resolved problems to display.    Discharge Condition: stable  Diet recommendation: heart healthy/carb modified  Filed Weights   09/20/17 0532 09/20/17 1100 09/22/17 0647  Weight: 132.5 kg (292 lb 1.8 oz) 132.5 kg (292 lb 1.8 oz) 129.6 kg (285 lb 11.5 oz)    History of present illness: (per admitting MD Dr PFlorene Glen  PCP: JJanith Lima MD Patient coming from: home  I have personally briefly reviewed patient's old medical records [facility] Chief Complaint: abdominal pain  HPI: CCEASER EBELINGis a 47y.o. male with medical history significant of  Non obstructive CAD, T2DM, sarcoidosis, asthma, gout presenting with several days of abdominal pain with imaging findings consistent with mild pancreatitis.  He notes that his pain started on Monday.  He describes it as a stabbing epigastric pain that radiates to his back.  He notes that it was mostly right-sided.  The pain was at its worst on Monday.  He notes it is constant, waxing and waning.  Worse with food.  Worse with movement.  Not much makes it better.  He denies any fevers, chills, cough, cold.  He does note some exertional shortness of  breath as well as some exertional left-sided chest discomfort.  He notes that the left-sided chest pain happened yesterday with exertion and then this morning as well with exertion.  And improved with rest.  He describes as different from his typical angina.  He has not had any episodes of vomiting since Wednesday.  He denies any diarrhea.  Denies any new meds or alcohol use.  He was seen in the ED on April 10 and diagnosed with mild pancreatitis.  He was treated with IV fluids and pain medication and discharged with antiemetics and pain medicine and recommendation to slowly advance diet.  He followed up with his PCP on the 12th who was planning on GI follow-up with possible HIDA scan.  He represented to the emergency department today with continued epigastric pain.  ED Course: In the ED he received IV pain medicine, labs.  Admit to hospitalist due to persistent abdominal pain.   Hospital Course:  Principal Problem:   Right upper quadrant abdominal pain Active Problems:   Diabetes mellitus type 2 in obese (HCC)   Gout   Essential hypertension   CAD (coronary artery disease)   GERD   Sarcoidosis ([facility]   Acute pancreatitis   Pancreatitis   #) Biliary colic:  Patient's abdominal pain is likely thought to be secondary to biliary colic with abnormal HIDA scan.  His prior visit on 09/15/2017 to the emergency department with mild pancreatitis could possibly reflect a small stone that was passing. -HIDA scan, mildly abnormal -s/p  lap scopic cholecystectomy and cholangiogram on 4/16 -feeling better, d/c home with general surgery follow up in two weeks  #) Gout: -Continue allopurinol 300 mg daily -Continue PRN colchicine 0.6 mg every 4 hours as needed -no issues while in the hospital  #) Gynecomastia: -Continue anastrozole 1 mg daily  #) Hypertension: -Continue carvedilol 3.125 mg twice daily - home meds  irbesartan-hydrochlorothiazide 300-2.5 mg daily held in the hospital, resumed at  discharge  #) Type 2 diabetes on oral hypoglycemics: noninsulin dependent -Sliding scale insulin, before meals at bedtime -home oral meds empagliflozin '10mg'$  daily, metformin 500 mg daily, sitagliptin 100 mg daily, p.o. glitazone 15 mg daily held in the hospital, resumed at discharge -a1c 7.2 in 06/2017  #) Nonobstructing coronary artery disease: -Continue coreg 3.125 bid -Continue rosuvastatin 40 mg daily -Continue aspirin 81 mg daily   #) Seizure disorder: -Continue levetiracetam 1000 mg daily  #) Sarcoidosis, complicated by neurological and pulmonary involvement: -  methotrexate 10 mg weekly on Monday, methotrexate hled on 4/15 while in the hospital, resume at discharge  #) Asthma: -Continue PRN bronchodilators  Body mass index is 35.71 kg/m.   Prophylaxis while in the hospital: Enoxaparin  Disposition: cleared to discharge home by general surgery  Full code  Consultants:   General surgery  Procedures: (Don't include imaging studies which can be auto populated. Include things that cannot be auto populated i.e. Echo, Carotid and venous dopplers, Foley, Bipap, HD, tubes/drains, wound vac, central lines etc)  09/19/2017 right upper quadrant ultrasound: 1. Cholelithiasis without evidence of acute cholecystitis. 2. No bile duct dilatation. 3. Fatty infiltration of the liver.   09/20/2017 HIDA scan: Patent cystic duct although the gallbladder fills in a somewhat delayed fashion likely related consistent with abnormal gallbladder  function. This may represent some chronic cholecystitis.  4/16: S/P lap cholecystectomy with IOC    Antimicrobials: (specify start and planned stop date. Auto populated tables are space occupying and do not give end dates)  Perioperative ancef     Discharge Exam: BP (!) 167/105 (BP Location: Left Arm)   Pulse 81   Temp 98.4 F (36.9 C) (Oral)   Resp 16   Ht '6\' 3"'$  (1.905 m)   Wt 129.6 kg (285 lb 11.5 oz)   SpO2 100%    BMI 35.71 kg/m   General: NAD Cardiovascular: RRR Respiratory: CTABL Ab: soft, nontender, +bs, insicions without drainage or erythema  Discharge Instructions You were cared for by a hospitalist during your hospital stay. If you have any questions about your discharge medications or the care you received while you were in the hospital after you are discharged, you can call the unit and asked to speak with the hospitalist on call if the hospitalist that took care of you is not available. Once you are discharged, your primary care physician will handle any further medical issues. Please note that NO REFILLS for any discharge medications will be authorized once you are discharged, as it is imperative that you return to your primary care physician (or establish a relationship with a primary care physician if you do not have one) for your aftercare needs so that they can reassess your need for medications and monitor your lab values.  Discharge Instructions    Diet general   Complete by:  As directed    Soft diet, low sodium,. Carb modified Advance diet as tolerated   Discharge instructions   Complete by:  As directed    Please check your blood pressure at home, bring  in blood pressure record for your primary care doctor to review at hospital discharge follow up.   Increase activity slowly   Complete by:  As directed      Allergies as of 09/22/2017      Reactions   Amlodipine Other (See Comments)   Ankle swelling with '10mg'$ , ok with '5mg'$  dose   Sulfamethoxazole Rash   fever   Sulfonamide Derivatives Rash, Other (See Comments)      Medication List    STOP taking these medications   oxyCODONE-acetaminophen 5-325 MG tablet Commonly known as:  PERCOCET/ROXICET     TAKE these medications   albuterol 108 (90 Base) MCG/ACT inhaler Commonly known as:  VENTOLIN HFA Inhale 2 puffs into the lungs every 6 (six) hours as needed for wheezing or shortness of breath.   allopurinol 300 MG  tablet Commonly known as:  ZYLOPRIM TAKE 1 TABLET(300 MG) BY MOUTH DAILY   anastrozole 1 MG tablet Commonly known as:  ARIMIDEX Take 1 mg by mouth daily.   aspirin EC 81 MG tablet Take 1 tablet (81 mg total) by mouth daily.   carvedilol 3.125 MG tablet Commonly known as:  COREG Take 1 tablet (3.125 mg total) by mouth 2 (two) times daily with a meal.   cholecalciferol 1000 units tablet Commonly known as:  VITAMIN D Take 1,000 Units by mouth daily.   colchicine 0.6 MG tablet Take 0.6 mg by mouth every 4 (four) hours as needed (gout pain).   folic acid 1 MG tablet Commonly known as:  FOLVITE TK 1 T PO QD   glucose blood test strip Commonly known as:  ONETOUCH VERIO Use TID   HYDROcodone-acetaminophen 5-325 MG tablet Commonly known as:  NORCO/VICODIN Take 1 tablet by mouth every 6 (six) hours as needed for moderate pain.   irbesartan-hydrochlorothiazide 300-12.5 MG tablet Commonly known as:  AVALIDE Take 1 tablet by mouth daily.   JARDIANCE 10 MG Tabs tablet Generic drug:  empagliflozin Take 10 mg by mouth daily.   levETIRAcetam 1000 MG tablet Commonly known as:  KEPPRA Take 1,000 mg by mouth 2 (two) times daily.   metFORMIN 500 MG 24 hr tablet Commonly known as:  GLUCOPHAGE-XR TAKE 1 TABLET(500 MG) BY MOUTH TWICE DAILY WITH A MEAL   methotrexate 2.5 MG tablet Commonly known as:  RHEUMATREX Take 10 mg by mouth once a week. Take 4 tablets ('10mg'$ ) once weekly on Mondays. Caution:Chemotherapy. Protect from light.   nitroGLYCERIN 0.4 MG SL tablet Commonly known as:  NITROSTAT Place 1 tablet (0.4 mg total) under the tongue every 5 (five) minutes as needed for chest pain.   ondansetron 4 MG disintegrating tablet Commonly known as:  ZOFRAN ODT '4mg'$  ODT q4 hours prn nausea/vomit What changed:    how much to take  how to take this  when to take this  reasons to take this  additional instructions   [place] w/Device Kit 1 Act by Does not  apply route 3 (three) times daily with meals as needed.   pioglitazone 15 MG tablet Commonly known as:  ACTOS Take 1 tablet (15 mg total) by mouth daily.   potassium chloride 10 MEQ tablet Commonly known as:  K-DUR Take 1 tablet (10 mEq total) by mouth daily for 4 days.   potassium chloride SA 20 MEQ tablet Commonly known as:  K-DUR,KLOR-CON TAKE 1 TABLET(20 MEQ) BY MOUTH TWICE DAILY   rosuvastatin 40 MG tablet Commonly known as:  CRESTOR Take 1 tablet (40 mg total)  by mouth daily.   saw palmetto 160 MG capsule Take 160 mg by mouth daily.   senna-docusate 8.6-50 MG tablet Commonly known as:  Senokot-S Take 1 tablet by mouth at bedtime.   sitaGLIPtin 100 MG tablet Commonly known as:  [name] Take 100 mg by mouth daily.   torsemide 20 MG tablet Commonly known as:  DEMADEX Take 20 mg by mouth daily.   vitamin B-12 250 MCG tablet Commonly known as:  CYANOCOBALAMIN Take 250 mcg by mouth daily.      Allergies  Allergen Reactions  . Amlodipine Other (See Comments)    Ankle swelling with '10mg'$ , ok with '5mg'$  dose  . Sulfamethoxazole Rash    fever  . Sulfonamide Derivatives Rash and Other (See Comments)   Follow-up [facility] Go on 10/05/2017.   Specialty:  General Surgery Why:  at 11:30 am Please arrive 30 mintues prior to your appointment to complete paperwork. Contact information: [address] [place] [phone]       Janith Lima, MD Follow up on 09/27/2017.   Specialty:  Internal Medicine Why:  hospital discharge follow up Contact information: [address] [address] [phone]            The results of significant diagnostics from this hospitalization (including imaging, microbiology, ancillary and laboratory) are listed below for reference.    Significant Diagnostic Studies: Dg Chest 2 View  Result Date: 09/14/2017 CLINICAL DATA:   Left-sided chest pain. EXAM: CHEST - 2 VIEW COMPARISON:  Radiograph 05/07/2016 FINDINGS: The cardiomediastinal contours are normal. The lungs are clear. Pulmonary vasculature is normal. No consolidation, pleural effusion, or pneumothorax. No acute osseous abnormalities are seen. IMPRESSION: No acute pulmonary process. Electronically Signed   By: Jeb Levering M.D.   On: 09/14/2017 05:43   Dg Cholangiogram Operative  Result Date: 09/21/2017 CLINICAL DATA:  Pain EXAM: INTRAOPERATIVE CHOLANGIOGRAM TECHNIQUE: Cholangiographic images from the C-arm fluoroscopic device were submitted for interpretation post-operatively. Please see the procedural report for the amount of contrast and the fluoroscopy time utilized. COMPARISON:  Scintigraphy 09/20/2017 FINDINGS: No persistent filling defects in the common duct. Intrahepatic ducts are incompletely visualized, appearing decompressed centrally. Contrast passes into the duodenum. : Negative for retained common duct stone. Electronically Signed   By: Lucrezia Europe M.D.   On: 09/21/2017 14:17   Ct Abdomen Pelvis W Contrast  Result Date: 09/15/2017 CLINICAL DATA:  Lower abdominal pain and tenderness with vomiting for 3 days. EXAM: CT ABDOMEN AND PELVIS WITH CONTRAST TECHNIQUE: Multidetector CT imaging of the abdomen and pelvis was performed using the standard protocol following bolus administration of intravenous contrast. CONTRAST:  72m ISOVUE-300 IOPAMIDOL (ISOVUE-300) INJECTION 61%, 1055mOMNIPAQUE IOHEXOL 300 MG/ML SOLN COMPARISON:  Abdominal radiographs from earlier today. 01/12/2010 CT abdomen/pelvis. FINDINGS: Lower chest: No significant pulmonary nodules or acute consolidative airspace disease. Hepatobiliary: Diffuse hepatic steatosis. No liver surface irregularity. No liver mass. Normal gallbladder with no radiopaque cholelithiasis. No biliary ductal dilatation. Pancreas: There is heterogeneous fatty infiltration of the pancreas in the pancreatic head and neck.  There is mild fullness of the pancreatic head with slight haziness of the peripancreatic fat in the pancreatic head, cannot exclude mild acute pancreatitis. No discrete pancreatic mass. No pancreatic duct dilation. No peripancreatic fluid collections. Spleen: Normal size. No mass. Adrenals/Urinary Tract: Normal adrenals. Small simple exophytic renal cysts in the lower kidneys bilaterally, largest 1.3 cm on the left. Subcentimeter hypodense  renal cortical lesions in the interpolar kidneys bilaterally, too small to characterize. No hydronephrosis. Normal bladder. Stomach/Bowel: Normal non-distended stomach. Normal caliber small bowel with no small bowel wall thickening. Normal appendix. Normal large bowel with no diverticulosis, large bowel wall thickening or pericolonic fat stranding. Vascular/Lymphatic: Normal caliber abdominal aorta. Patent portal, splenic, hepatic and renal veins. No pathologically enlarged lymph nodes in the abdomen or pelvis. Reproductive: Mildly enlarged prostate. Other: No pneumoperitoneum, ascites or focal fluid collection. Musculoskeletal: No aggressive appearing focal osseous lesions. Mild thoracolumbar spondylosis. IMPRESSION: 1. Mild fullness of the pancreatic head with associated slight haziness of the peripancreatic fat, cannot exclude a mild uncomplicated acute pancreatitis. No discrete pancreatic mass. No pancreatic or biliary ductal dilatation. No peripancreatic fluid collections. 2. No evidence of bowel obstruction or acute bowel inflammation. 3. Diffuse hepatic steatosis. 4. Mildly enlarged prostate. Electronically Signed   By: Ilona Sorrel M.D.   On: 09/15/2017 18:12   Nm Hepato W/eject Fract  Result Date: 09/20/2017 CLINICAL DATA:  Right upper quadrant pain with nausea and vomiting EXAM: NUCLEAR MEDICINE HEPATOBILIARY IMAGING TECHNIQUE: Sequential images of the abdomen were obtained out to 60 minutes following intravenous administration of radiopharmaceutical.  RADIOPHARMACEUTICALS:  5.3 mCi Tc-25m Choletec IV COMPARISON:  Ultrasound from 09/19/2017 FINDINGS: There is prompt uptake of biliary tracer throughout the liver. Visualization of the biliary tree with opacification of the small bowel is noted. No significant gallbladder opacification is noted. 5.3 mg of morphine was then administered intravenously and repeat imaging was obtained. On significant delayed images following morphine administration there was filling within the gallbladder. IMPRESSION: Patent cystic duct although the gallbladder fills in a somewhat delayed fashion likely related consistent with abnormal gallbladder function. This may represent some chronic cholecystitis. No biliary obstruction is seen. Electronically Signed   By: MInez CatalinaM.D.   On: 09/20/2017 15:39   Dg Chest Port 1 View  Result Date: 09/19/2017 CLINICAL DATA:  Shortness of breath. Heart murmur. Coronary artery disease. EXAM: PORTABLE CHEST 1 VIEW COMPARISON:  09/15/2017 FINDINGS: The heart size and mediastinal contours are within normal limits. Both lungs are clear. The visualized skeletal structures are unremarkable. IMPRESSION: No active disease. Electronically Signed   By: JEarle GellM.D.   On: 09/19/2017 11:05   Dg Abd Acute W/chest  Result Date: 09/15/2017 CLINICAL DATA:  Generalized abd pain, N/V, constipation x 3 days intermittent, recent cp, hx of HTN, DM, Sarcoid, x-smoker. EXAM: DG ABDOMEN ACUTE W/ 1V CHEST COMPARISON:  Chest radiograph, 09/14/2017. FINDINGS: There is no evidence of dilated bowel loops or free intraperitoneal air. No radiopaque calculi or other significant radiographic abnormality is seen. Heart size and mediastinal contours are within normal limits. Both lungs are clear. IMPRESSION: Negative abdominal radiographs.  No acute cardiopulmonary disease. Electronically Signed   By: DLajean ManesM.D.   On: 09/15/2017 14:38   UKoreaAbdomen Limited Ruq  Result Date: 09/19/2017 CLINICAL DATA:  Abdominal  pain since Wednesday, pancreatitis. EXAM: ULTRASOUND ABDOMEN LIMITED RIGHT UPPER QUADRANT COMPARISON:  None. FINDINGS: Gallbladder: Small layering gallstones. No gallbladder wall thickening, pericholecystic fluid, sonographic Murphy's sign or other secondary signs of acute cholecystitis. Common bile duct: Diameter: 4 mm Liver: No focal lesion identified. Diffusely echogenic parenchyma indicating fatty infiltration. Portal vein is patent on color Doppler imaging with normal direction of blood flow towards the liver. IMPRESSION: 1. Cholelithiasis without evidence of acute cholecystitis. 2. No bile duct dilatation. 3. Fatty infiltration of the liver. Electronically Signed   By: SRoxy HorsemanD.  On: 09/19/2017 14:16    Microbiology: Recent Results (from the past 240 hour(s))  Surgical pcr screen     Status: None   Collection Time: 09/20/17  6:27 PM  Result Value Ref Range Status   MRSA, PCR NEGATIVE NEGATIVE Final   Staphylococcus aureus NEGATIVE NEGATIVE Final    Comment: (NOTE) The Xpert SA Assay (FDA approved for NASAL specimens in patients 43 years of age and older), is one component of a comprehensive surveillance program. It is not intended to diagnose infection nor to guide or monitor treatment. Performed at [facility], [place] [address], [place], [place] [address]      Labs: Basic Metabolic Panel: Recent Labs  Lab 09/15/17 1438 09/17/17 1124 09/19/17 0500 09/19/17 0910 09/20/17 0422 09/22/17 0436  NA 134* 135 135  --  138 137  K 3.0* 4.0 4.4  --  4.0 4.1  CL 91* 94* 99*  --  103 105  CO2 35* 31 26  --  27 22  GLUCOSE 146* 129* 144*  --  117* 169*  BUN '9 9 8  '$ --  9 7  CREATININE 0.86 0.96 0.97 0.84 0.85 0.69  CALCIUM 9.9 9.4 9.1  --  8.7* 9.0   Liver Function Tests: Recent Labs  Lab 09/15/17 1438 09/17/17 1124 09/19/17 0500 09/20/17 0422  AST '24 23 30 26  '$ ALT 56* 45 45 43  ALKPHOS 81 71 67 56  BILITOT 1.2 1.0 0.3 0.8  PROT 8.3 7.6 7.5 6.3*   ALBUMIN 4.4 4.0 3.9 3.1*   Recent Labs  Lab 09/15/17 1438 09/17/17 1124 09/19/17 0500 09/20/17 0422  LIPASE 19.0 23.0 52* 32  AMYLASE 48 52  --   --    No results for input(s): AMMONIA in the last 168 hours. CBC: Recent Labs  Lab 09/15/17 1438 09/17/17 1124 09/19/17 0500 09/19/17 0910 09/20/17 0422 09/22/17 0436  WBC 12.5* 7.1 5.9 6.5 5.6 6.4  NEUTROABS 7.1 2.9 2.0  --   --   --   HGB 16.7 15.5 15.3 15.1 13.6 13.8  HCT 48.4 44.9 43.1 43.0 39.5 40.2  MCV 79.6 80.1 77.1* 79.5 79.8 78.1  PLT 270.0 282.0 228 254 213 244   Cardiac Enzymes: Recent Labs  Lab 09/19/17 0910 09/19/17 1509 09/19/17 2105  TROPONINI <0.03 <0.03 <0.03   BNP: BNP (last 3 results) No results for input(s): BNP in the last 8760 hours.  ProBNP (last 3 results) No results for input(s): PROBNP in the last 8760 hours.  CBG: Recent Labs  Lab 09/21/17 1147 09/21/17 1432 09/21/17 1616 09/21/17 2202 09/22/17 0738  GLUCAP 139* 145* 166* 237* 150*       Signed:  Florencia Reasons MD, PhD  [facility] 09/22/2017, 11:28 AM

## 2017-09-22 NOTE — Progress Notes (Signed)
[facility]/Trauma Progress Note  1 Day Post-Op   Assessment/Plan Principal Problem:   Right upper quadrant abdominal pain Active Problems:   Diabetes mellitus type 2 in obese (HCC)   Gout   Essential hypertension   CAD (coronary artery disease)   GERD   Sarcoidosis (HCC)   Acute pancreatitis   Pancreatitis  Cholelithiasis and cholecystitis - S/P lap cholecystectomy with IOC  FEN: soft diet VTE: SCD's, lovenox ID: Ancef preop Foley: none Follow up: [facility] office 2 weeks  DISPO: Pt is clear for discharge from a surgical standpoint.     LOS: 2 days    Subjective: CC: abdominal soreness  Pt states he is feeling much better today. His abdomen is sore but he is not having the same pain he was before surgery. He has walked, urinated, passed flatus, tolerated a full liquid diet. He denies fever, chills, nausea or vomiting overnight.   Objective: Vital signs in last 24 hours: Temp:  [97.7 F (36.5 C)-98.7 F (37.1 C)] 97.8 F (36.6 C) (04/17 0512) Pulse Rate:  [70-90] 70 (04/17 0512) Resp:  [16-22] 18 (04/17 0512) BP: (122-154)/(72-90) 139/72 (04/17 0512) SpO2:  [98 %-100 %] 100 % (04/17 0512) Weight:  [129.6 kg (285 lb 11.5 oz)] 129.6 kg (285 lb 11.5 oz) (04/17 0647) Last BM Date: 09/20/17  Intake/Output from previous day: 04/16 0701 - 04/17 0700 In: 3240 [P.O.:1140; I.V.:2100] Out: 2425 [Urine:2400; Blood:25] Intake/Output this shift: No intake/output data recorded.  PE: Gen:  Alert, NAD, pleasant, cooperative Card:  RRR, no M/G/R heard Pulm:  CTA, no W/R/R, rate and effort normal Abd: Soft, ND, +BS, incisions with glue intact are without drainage or erythema, very mild generalized TTP without guarding, no signs of peritonitis Skin: no rashes noted, warm and dry   Anti-infectives: Anti-infectives (From admission, onward)   Start     Dose/Rate Route Frequency Ordered Stop   09/21/17 1100  ceFAZolin (ANCEF) 3 g in dextrose 5 % 50 mL IVPB     3  g 130 mL/hr over 30 Minutes Intravenous To Surgery 09/20/17 1734 09/21/17 1304      Lab Results:  Recent Labs    09/20/17 0422 09/22/17 0436  WBC 5.6 6.4  HGB 13.6 13.8  HCT 39.5 40.2  PLT 213 244   BMET Recent Labs    09/20/17 0422 09/22/17 0436  NA 138 137  K 4.0 4.1  CL 103 105  CO2 27 22  GLUCOSE 117* 169*  BUN 9 7  CREATININE 0.85 0.69  CALCIUM 8.7* 9.0   PT/INR No results for input(s): LABPROT, INR in the last 72 hours. CMP     Component Value Date/Time   NA 137 09/22/2017 0436   NA 141 06/17/2016 0745   K 4.1 09/22/2017 0436   CL 105 09/22/2017 0436   CO2 22 09/22/2017 0436   GLUCOSE 169 (H) 09/22/2017 0436   BUN 7 09/22/2017 0436   BUN 11 06/17/2016 0745   CREATININE 0.69 09/22/2017 0436   CREATININE 0.91 04/07/2016 1529   CALCIUM 9.0 09/22/2017 0436   PROT 6.3 (L) 09/20/2017 0422   ALBUMIN 3.1 (L) 09/20/2017 0422   AST 26 09/20/2017 0422   ALT 43 09/20/2017 0422   ALKPHOS 56 09/20/2017 0422   BILITOT 0.8 09/20/2017 0422   GFRNONAA >60 09/22/2017 0436   GFRAA >60 09/22/2017 0436   Lipase     Component Value Date/Time   LIPASE 32 09/20/2017 0422    Studies/Results: Dg Cholangiogram Operative  Result  Date: 09/21/2017 CLINICAL DATA:  Pain EXAM: INTRAOPERATIVE CHOLANGIOGRAM TECHNIQUE: Cholangiographic images from the C-arm fluoroscopic device were submitted for interpretation post-operatively. Please see the procedural report for the amount of contrast and the fluoroscopy time utilized. COMPARISON:  Scintigraphy 09/20/2017 FINDINGS: No persistent filling defects in the common duct. Intrahepatic ducts are incompletely visualized, appearing decompressed centrally. Contrast passes into the duodenum. : Negative for retained common duct stone. Electronically Signed   By: Corlis Leak M.D.   On: 09/21/2017 14:17   Nm Hepato W/eject Fract  Result Date: 09/20/2017 CLINICAL DATA:  Right upper quadrant pain with nausea and vomiting EXAM: NUCLEAR MEDICINE  HEPATOBILIARY IMAGING TECHNIQUE: Sequential images of the abdomen were obtained out to 60 minutes following intravenous administration of radiopharmaceutical. RADIOPHARMACEUTICALS:  5.3 mCi Tc-90m  Choletec IV COMPARISON:  Ultrasound from 09/19/2017 FINDINGS: There is prompt uptake of biliary tracer throughout the liver. Visualization of the biliary tree with opacification of the small bowel is noted. No significant gallbladder opacification is noted. 5.3 mg of morphine was then administered intravenously and repeat imaging was obtained. On significant delayed images following morphine administration there was filling within the gallbladder. IMPRESSION: Patent cystic duct although the gallbladder fills in a somewhat delayed fashion likely related consistent with abnormal gallbladder function. This may represent some chronic cholecystitis. No biliary obstruction is seen. Electronically Signed   By: Alcide Clever M.D.   On: 09/20/2017 15:39      Jerre Simon , [facility] 09/22/2017, 9:13 AM  Pager: [phone] Mon-Wed, Friday 7:00am-4:30pm Thurs 7am-11:30am  Consults: [phone]

## 2017-09-23 ENCOUNTER — Telehealth: Payer: Self-pay

## 2017-09-23 NOTE — Telephone Encounter (Signed)
Transition Care Management Follow-up Telephone Call   Date discharged? 09/22/2017  How have you been since you were released from the hospital? Pt states that he is feeling much better. Pt states that he had a bowel movement.   Do you understand why you were in the hospital? Yes   Do you understand the discharge instructions? Yes  Where were you discharged to? Home  Items Reviewed:  Medications reviewed: Yes  Allergies reviewed: Yes  Dietary changes reviewed: Yes   Referrals reviewed: Yes  Functional Questionnaire:   Activities of Daily Living (ADLs):    States they are independent in the following: All  States they require assistance with the following: none  Any transportation issues/concerns?: No  Any patient concerns? No  Confirmed importance and date/time of follow-up visits scheduled: Yes  Provider Appointment booked with Dr. Yetta Barre on Monday April 22nd, 2019 at 8:15am  Confirmed with patient if condition begins to worsen call PCP or go to the ER.  Patient was given the office number and encouraged to call back with questions or concerns: Yes

## 2017-09-27 ENCOUNTER — Ambulatory Visit: Payer: Medicare Other | Admitting: Internal Medicine

## 2017-09-27 ENCOUNTER — Encounter: Payer: Self-pay | Admitting: Internal Medicine

## 2017-09-27 VITALS — BP 138/82 | HR 84 | Temp 98.2°F | Resp 16 | Ht 75.0 in | Wt 273.8 lb

## 2017-09-27 DIAGNOSIS — E669 Obesity, unspecified: Secondary | ICD-10-CM | POA: Diagnosis not present

## 2017-09-27 DIAGNOSIS — I10 Essential (primary) hypertension: Secondary | ICD-10-CM | POA: Diagnosis not present

## 2017-09-27 DIAGNOSIS — E1169 Type 2 diabetes mellitus with other specified complication: Secondary | ICD-10-CM | POA: Diagnosis not present

## 2017-09-27 DIAGNOSIS — E291 Testicular hypofunction: Secondary | ICD-10-CM | POA: Diagnosis not present

## 2017-09-27 LAB — POCT GLYCOSYLATED HEMOGLOBIN (HGB A1C): Hemoglobin A1C: 7.2

## 2017-09-27 NOTE — Progress Notes (Signed)
Subjective:  Patient ID: [name], male    DOB: [birth year]  Age: 47 y.o. MRN: [id number]  CC: Hospitalization Follow-up and Diabetes   HPI [name] presents for f/up - He was recently admitted for an episode of gallstone pancreatitis and underwent successful cholecystectomy.  He returns today and tells me he feels well.  His abdominal pain has resolved and he denies nausea, vomiting, fever, chills, or loss of appetite.  Outpatient Medications Prior to Visit  Medication Sig Dispense Refill  . albuterol (VENTOLIN HFA) 108 (90 Base) MCG/ACT inhaler Inhale 2 puffs into the lungs every 6 (six) hours as needed for wheezing or shortness of breath. 3 Inhaler 1  . allopurinol (ZYLOPRIM) 300 MG tablet TAKE 1 TABLET(300 MG) BY MOUTH DAILY 90 tablet 0  . anastrozole (ARIMIDEX) 1 MG tablet Take 1 mg by mouth daily.    Marland Kitchen aspirin EC 81 MG tablet Take 1 tablet (81 mg total) by mouth daily. 30 tablet 3  . Blood Glucose Monitoring Suppl (ONETOUCH VERIO FLEX SYSTEM) w/Device KIT 1 Act by Does not apply route 3 (three) times daily with meals as needed. 2 kit 0  . carvedilol (COREG) 3.125 MG tablet Take 1 tablet (3.125 mg total) by mouth 2 (two) times daily with a meal. 180 tablet 0  . cholecalciferol (VITAMIN D) 1000 units tablet Take 1,000 Units by mouth daily.    . colchicine 0.6 MG tablet Take 0.6 mg by mouth every 4 (four) hours as needed (gout pain).    Marland Kitchen empagliflozin (JARDIANCE) 10 MG TABS tablet Take 10 mg by mouth daily.    . folic acid (FOLVITE) 1 MG tablet TK 1 T PO QD  1  . glucose blood (ONETOUCH VERIO) test strip Use TID 100 each 11  . irbesartan-hydrochlorothiazide (AVALIDE) 300-12.5 MG tablet Take 1 tablet by mouth daily.    Marland Kitchen levETIRAcetam (KEPPRA) 1000 MG tablet Take 1,000 mg by mouth 2 (two) times daily.    . metFORMIN (GLUCOPHAGE-XR) 500 MG 24 hr tablet TAKE 1 TABLET(500 MG) BY MOUTH TWICE DAILY WITH A MEAL 180 tablet 0  . methotrexate (RHEUMATREX) 2.5 MG tablet Take 10 mg  by mouth once a week. Take 4 tablets ('10mg'$ ) once weekly on Mondays. Caution:Chemotherapy. Protect from light.    . nitroGLYCERIN (NITROSTAT) 0.4 MG SL tablet Place 1 tablet (0.4 mg total) under the tongue every 5 (five) minutes as needed for chest pain. 25 tablet 3  . pioglitazone (ACTOS) 15 MG tablet Take 1 tablet (15 mg total) by mouth daily. 30 tablet 11  . potassium chloride SA (K-DUR,KLOR-CON) 20 MEQ tablet TAKE 1 TABLET(20 MEQ) BY MOUTH TWICE DAILY 180 tablet 0  . rosuvastatin (CRESTOR) 40 MG tablet Take 1 tablet (40 mg total) by mouth daily. 90 tablet 1  . saw palmetto 160 MG capsule Take 160 mg by mouth daily.    . sitaGLIPtin ([name]) 100 MG tablet Take 100 mg by mouth daily.    Marland Kitchen torsemide (DEMADEX) 20 MG tablet Take 20 mg by mouth daily.    . vitamin B-12 (CYANOCOBALAMIN) 250 MCG tablet Take 250 mcg by mouth daily.    Marland Kitchen HYDROcodone-acetaminophen (NORCO/VICODIN) 5-325 MG tablet Take 1 tablet by mouth every 6 (six) hours as needed for moderate pain. 15 tablet 0  . ondansetron (ZOFRAN ODT) 4 MG disintegrating tablet '4mg'$  ODT q4 hours prn nausea/vomit (Patient taking differently: Take 4 mg by mouth every 4 (four) hours as needed for nausea or vomiting. ) 10 tablet  0  . senna-docusate (SENOKOT-S) 8.6-50 MG tablet Take 1 tablet by mouth at bedtime. 30 tablet 0  . potassium chloride (K-DUR) 10 MEQ tablet Take 1 tablet (10 mEq total) by mouth daily for 4 days. (Patient not taking: Reported on 09/19/2017) 4 tablet 0   No facility-administered medications prior to visit.     ROS Review of Systems  Constitutional: Negative.  Negative for appetite change, chills, diaphoresis and fatigue.  HENT: Negative.   Eyes: Negative for visual disturbance.  Respiratory: Negative for cough, chest tightness, shortness of breath and wheezing.   Cardiovascular: Negative.  Negative for chest pain, palpitations and leg swelling.  Gastrointestinal: Negative for abdominal pain, diarrhea and vomiting.  Endocrine:  Negative.  Negative for polydipsia, polyphagia and polyuria.  Genitourinary: Negative.  Negative for difficulty urinating.  Musculoskeletal: Negative.  Negative for arthralgias, back pain, myalgias and neck pain.  Skin: Negative.  Negative for color change.  Allergic/Immunologic: Negative.   Neurological: Negative.  Negative for dizziness and light-headedness.  Hematological: Negative for adenopathy. Does not bruise/bleed easily.  Psychiatric/Behavioral: Negative.     Objective:  BP 138/82 (BP Location: Left Arm, Patient Position: Sitting, Cuff Size: Large)   Pulse 84   Temp 98.2 F (36.8 C) (Oral)   Resp 16   Ht '6\' 3"'$  (1.905 m)   Wt 273 lb 12.8 oz (124.2 kg)   SpO2 95%   BMI 34.22 kg/m   BP Readings from Last 3 Encounters:  09/27/17 138/82  09/22/17 (!) 167/105  09/17/17 132/90    Wt Readings from Last 3 Encounters:  09/27/17 273 lb 12.8 oz (124.2 kg)  09/22/17 285 lb 11.5 oz (129.6 kg)  09/17/17 280 lb 0.6 oz (127 kg)    Physical Exam  Constitutional: He is oriented to person, place, and time. No distress.  HENT:  Mouth/Throat: Oropharynx is clear and moist. No oropharyngeal exudate.  Eyes: Conjunctivae are normal. No scleral icterus.  Neck: Normal range of motion. Neck supple. No JVD present. No thyromegaly present.  Cardiovascular: Normal rate, regular rhythm and normal heart sounds. Exam reveals no gallop and no friction rub.  No murmur heard. Pulmonary/Chest: Effort normal and breath sounds normal. No stridor. No respiratory distress. He has no wheezes. He has no rales. He exhibits no tenderness.  Abdominal: Soft. Bowel sounds are normal. He exhibits no distension and no mass. There is no tenderness. There is no rebound and no guarding.    Musculoskeletal: Normal range of motion. He exhibits no edema, tenderness or deformity.  Neurological: He is alert and oriented to person, place, and time.  Skin: Skin is warm and dry. He is not diaphoretic.  Vitals  reviewed.   Lab Results  Component Value Date   WBC 6.4 09/22/2017   HGB 13.8 09/22/2017   HCT 40.2 09/22/2017   PLT 244 09/22/2017   GLUCOSE 169 (H) 09/22/2017   CHOL 176 09/19/2017   TRIG 97 09/19/2017   HDL 34 (L) 09/19/2017   LDLCALC 123 (H) 09/19/2017   ALT 43 09/20/2017   AST 26 09/20/2017   NA 137 09/22/2017   K 4.1 09/22/2017   CL 105 09/22/2017   CREATININE 0.69 09/22/2017   BUN 7 09/22/2017   CO2 22 09/22/2017   TSH 0.66 03/01/2017   PSA 1.33 05/07/2016   INR 0.89 05/24/2016   HGBA1C 7.2 09/27/2017   MICROALBUR 2.0 (H) 06/29/2017    Nm Hepato W/eject Fract  Result Date: 09/20/2017 CLINICAL DATA:  Right upper quadrant pain with nausea  and vomiting EXAM: NUCLEAR MEDICINE HEPATOBILIARY IMAGING TECHNIQUE: Sequential images of the abdomen were obtained out to 60 minutes following intravenous administration of radiopharmaceutical. RADIOPHARMACEUTICALS:  5.3 mCi Tc-24m Choletec IV COMPARISON:  Ultrasound from 09/19/2017 FINDINGS: There is prompt uptake of biliary tracer throughout the liver. Visualization of the biliary tree with opacification of the small bowel is noted. No significant gallbladder opacification is noted. 5.3 mg of morphine was then administered intravenously and repeat imaging was obtained. On significant delayed images following morphine administration there was filling within the gallbladder. IMPRESSION: Patent cystic duct although the gallbladder fills in a somewhat delayed fashion likely related consistent with abnormal gallbladder function. This may represent some chronic cholecystitis. No biliary obstruction is seen. Electronically Signed   By: MInez CatalinaM.D.   On: 09/20/2017 15:39   Dg Chest Port 1 View  Result Date: 09/19/2017 CLINICAL DATA:  Shortness of breath. Heart murmur. Coronary artery disease. EXAM: PORTABLE CHEST 1 VIEW COMPARISON:  09/15/2017 FINDINGS: The heart size and mediastinal contours are within normal limits. Both lungs are clear.  The visualized skeletal structures are unremarkable. IMPRESSION: No active disease. Electronically Signed   By: JEarle GellM.D.   On: 09/19/2017 11:05   [place]Abdomen Limited Ruq  Result Date: 09/19/2017 CLINICAL DATA:  Abdominal pain since Wednesday, pancreatitis. EXAM: ULTRASOUND ABDOMEN LIMITED RIGHT UPPER QUADRANT COMPARISON:  None. FINDINGS: Gallbladder: Small layering gallstones. No gallbladder wall thickening, pericholecystic fluid, sonographic Murphy's sign or other secondary signs of acute cholecystitis. Common bile duct: Diameter: 4 mm Liver: No focal lesion identified. Diffusely echogenic parenchyma indicating fatty infiltration. Portal vein is patent on color Doppler imaging with normal direction of blood flow towards the liver. IMPRESSION: 1. Cholelithiasis without evidence of acute cholecystitis. 2. No bile duct dilatation. 3. Fatty infiltration of the liver. Electronically Signed   By: SFranki CabotM.D.   On: 09/19/2017 14:16    Assessment & Plan:   CMykahwas seen today for hospitalization follow-up and diabetes.  Diagnoses and all orders for this visit:  Diabetes mellitus type 2 in obese (HChance- His A1c is at 7.2%.  His blood sugars are adequately well controlled on the current regimen. -     POCT HgB A1C  Essential hypertension- His blood pressure is adequately well controlled.   I have discontinued Onis T. Lehr's ondansetron, potassium chloride, HYDROcodone-acetaminophen, and senna-docusate. I am also having him maintain his aspirin EC, cholecalciferol, folic acid, ONETOUCH VERIO FLEX SYSTEM, glucose blood, rosuvastatin, pioglitazone, allopurinol, nitroGLYCERIN, carvedilol, albuterol, metFORMIN, potassium chloride SA, torsemide, irbesartan-hydrochlorothiazide, sitaGLIPtin, saw palmetto, vitamin B-12, methotrexate, empagliflozin, levETIRAcetam, colchicine, and anastrozole.  No orders of the defined types were placed in this encounter.    Follow-up: Return in about 6  months (around 03/29/2018).  TScarlette Calico MD

## 2017-09-27 NOTE — Patient Instructions (Signed)

## 2017-09-28 ENCOUNTER — Telehealth: Payer: Self-pay

## 2017-09-28 NOTE — Telephone Encounter (Signed)
I have done a Torsemide PA through covermymeds. 

## 2017-09-29 NOTE — Telephone Encounter (Signed)
Follow up   Pt c/o medication issue:  1. Name of Medication: torsemide (DEMADEX) 20 MG tablet  2. How are you currently taking this medication (dosage and times per day)? Take 20 mg by mouth daily.  3. Are you having a reaction (difficulty breathing--STAT)? no  4. What is your medication issue? [name] with [employer] states that the medication has been approved and is good for a year. No call back needed

## 2017-09-30 ENCOUNTER — Encounter: Payer: Self-pay | Admitting: Internal Medicine

## 2017-09-30 MED ORDER — TORSEMIDE 10 MG PO TABS: 20.0000 mg | ORAL_TABLET | Freq: Every day | ORAL | 2 refills | Status: DC

## 2017-09-30 NOTE — Telephone Encounter (Signed)
Pharmacy faxed over paperwork to our office for a drug change request for this pts torsemide. Per [facility], torsemide 20 mg tabs are currently on backorder, and it could be several weeks before they receive a supply of this med.  Pharmacist at [facility] did state that they have the 10 mg tabs of Torsemide on hand, and the pt can take 2 of these tabs to get torsemide 20 mg po daily.  Asked the pharmacist if this will require another PA, for our PA Nurse just got his torsemide 20 mg tabs approved for a year for the pt through [employer].  Pharmacist states that's unknown, but will fax [place] any paperwork requesting another PA on tab change, as needed.  Sent in the pt a new script to [facility] for torsemide 10 mg tablets, take 2 tabs (20 mg total) po daily.  Made the pt aware of this change and to make sure he takes 2 tablets of his 10 mg torsemide, to obtain his goal dose of 20 mg po daily. Pt verbalized understanding and agrees with this plan.

## 2017-10-01 ENCOUNTER — Ambulatory Visit: Payer: Self-pay | Admitting: *Deleted

## 2017-10-01 NOTE — Telephone Encounter (Signed)
Pt states this morning around 7 am after taking a shower he felt like he was going to pass out. Pt states that he noticed the sensation again while going from a sitting to standing position. Pt states he has been feeling lightheaded off on on since 7 am this morning. Pt checked his CBG, thinking that maybe his blood glucose was decreased but it was 189. Pt states he feels like his heart is beating rapidly. Pt denies being on any new medications Pt is currently at work. Pt does state that he took 20 mg of Torsemide last night before going to bed which he had not taken in over a week due to the medication being on back order. Pt states he did not check his BP last night before taking medication this morning or last night. Pt advised that he needed to have some one to drive him to the ED and not drive his self. Pt advised to call 911 if he can not get anyone to drive him to the ED. Pt verbalized understanding and states he will go to the ED.  Reason for Disposition . SEVERE dizziness (e.g., unable to stand, requires support to walk, feels like passing out now)  Answer Assessment - Initial Assessment Questions 1. DESCRIPTION: "Describe your dizziness."     Feels like he is going to pass out 2. LIGHTHEADED: "Do you feel lightheaded?" (e.g., somewhat faint, woozy, weak upon standing)     Weak with standing, feels faint 3. VERTIGO: "Do you feel like either you or the room is spinning or tilting?" (i.e. vertigo)     No 4. SEVERITY: "How bad is it?"  "Do you feel like you are going to faint?" "Can you stand and walk?"   - MILD - walking normally   - MODERATE - interferes with normal activities (e.g., work, school)    - SEVERE - unable to stand, requires support to walk, feels like passing out now.      moderate 5. ONSET:  "When did the dizziness begin?"     Approximately 7am today 6. AGGRAVATING FACTORS: "Does anything make it worse?" (e.g., standing, change in head position)     Going from sitting to  standing position 7. HEART RATE: "Can you tell me your heart rate?" "How many beats in 15 seconds?"  (Note: not all patients can do this)       Feels like heart is beating rapidly 8. CAUSE: "What do you think is causing the dizziness?"     unsure 9. RECURRENT SYMPTOM: "Have you had dizziness before?" If so, ask: "When was the last time?" "What happened that time?"     This morning around 7 am 10. OTHER SYMPTOMS: "Do you have any other symptoms?" (e.g., fever, chest pain, vomiting, diarrhea, bleeding)      Diarrhea this morning  Protocols used: DIZZINESS [facility]

## 2017-10-04 DIAGNOSIS — R350 Frequency of micturition: Secondary | ICD-10-CM | POA: Diagnosis not present

## 2017-10-04 DIAGNOSIS — N401 Enlarged prostate with lower urinary tract symptoms: Secondary | ICD-10-CM | POA: Diagnosis not present

## 2017-10-04 DIAGNOSIS — E291 Testicular hypofunction: Secondary | ICD-10-CM | POA: Diagnosis not present

## 2017-10-05 NOTE — Telephone Encounter (Signed)
Pt contacted and he stated that he was not checking his BP before he took his BP meds. Pt felt that he was dehydrated and drank a lot of water and also took his potassium supplement.   Pt started checking BP before meds and noticed that his BP was low (90/72) and pt did not take the carvedilol. Since pt started doing this he has not had the symptoms described by [facility] triage.   Pt stated that he has not taken (due to BP reading) since Sunday 10/03/2017 due to BP being controlled.

## 2017-10-07 DIAGNOSIS — E119 Type 2 diabetes mellitus without complications: Secondary | ICD-10-CM | POA: Diagnosis not present

## 2017-10-29 ENCOUNTER — Ambulatory Visit: Payer: Self-pay | Admitting: Gastroenterology

## 2017-11-03 ENCOUNTER — Ambulatory Visit: Payer: Medicare Other | Admitting: Gastroenterology

## 2017-11-03 ENCOUNTER — Encounter: Payer: Self-pay | Admitting: Gastroenterology

## 2017-11-03 ENCOUNTER — Other Ambulatory Visit (INDEPENDENT_AMBULATORY_CARE_PROVIDER_SITE_OTHER): Payer: Medicare Other

## 2017-11-03 ENCOUNTER — Other Ambulatory Visit: Payer: Self-pay | Admitting: Gastroenterology

## 2017-11-03 ENCOUNTER — Other Ambulatory Visit: Payer: Self-pay

## 2017-11-03 VITALS — BP 122/74 | HR 78 | Ht 72.0 in | Wt 271.0 lb

## 2017-11-03 DIAGNOSIS — K760 Fatty (change of) liver, not elsewhere classified: Secondary | ICD-10-CM

## 2017-11-03 DIAGNOSIS — R1310 Dysphagia, unspecified: Secondary | ICD-10-CM | POA: Diagnosis not present

## 2017-11-03 DIAGNOSIS — R1013 Epigastric pain: Secondary | ICD-10-CM

## 2017-11-03 LAB — COMPREHENSIVE METABOLIC PANEL
ALT: 32 U/L (ref 0–53)
AST: 22 U/L (ref 0–37)
Albumin: 4.4 g/dL (ref 3.5–5.2)
Alkaline Phosphatase: 78 U/L (ref 39–117)
BUN: 13 mg/dL (ref 6–23)
CO2: 30 mEq/L (ref 19–32)
Calcium: 9.9 mg/dL (ref 8.4–10.5)
Chloride: 100 mEq/L (ref 96–112)
Creatinine, Ser: 0.88 mg/dL (ref 0.40–1.50)
GFR: 120.49 mL/min (ref >60.00)
Glucose, Bld: 213 mg/dL — ABNORMAL HIGH (ref 70–99)
Potassium: 3.7 mEq/L (ref 3.5–5.1)
Sodium: 139 mEq/L (ref 135–145)
Total Bilirubin: 0.5 mg/dL (ref 0.2–1.2)
Total Protein: 8.1 g/dL (ref 6.0–8.3)

## 2017-11-03 LAB — CBC WITH DIFFERENTIAL/PLATELET
Basophils Absolute: 0.1 10*3/uL (ref 0.0–0.1)
Basophils Relative: 1.4 % (ref 0.0–3.0)
Eosinophils Absolute: 0.2 10*3/uL (ref 0.0–0.7)
Eosinophils Relative: 3.7 % (ref 0.0–5.0)
HCT: 44.2 % (ref 39.0–52.0)
Hemoglobin: 15.1 g/dL (ref 13.0–17.0)
Lymphocytes Relative: 34.2 % (ref 12.0–46.0)
Lymphs Abs: 2.2 10*3/uL (ref 0.7–4.0)
MCHC: 34.1 g/dL (ref 30.0–36.0)
MCV: 79.8 fl (ref 78.0–100.0)
Monocytes Absolute: 0.6 10*3/uL (ref 0.1–1.0)
Monocytes Relative: 9 % (ref 3.0–12.0)
Neutro Abs: 3.3 10*3/uL (ref 1.4–7.7)
Neutrophils Relative %: 51.7 % (ref 43.0–77.0)
Platelets: 265 10*3/uL (ref 150.0–400.0)
RBC: 5.54 Mil/uL (ref 4.22–5.81)
RDW: 15.4 % (ref 11.5–15.5)
WBC: 6.4 10*3/uL (ref 4.0–10.5)

## 2017-11-03 LAB — LIPASE: Lipase: 18 U/L (ref 11.0–59.0)

## 2017-11-03 MED ORDER — SUCRALFATE 1 G PO TABS: 1.0000 g | ORAL_TABLET | Freq: Three times a day (TID) | ORAL | 1 refills | Status: DC

## 2017-11-03 MED ORDER — SUCRALFATE 1 GM/10ML PO SUSP: 1.0000 g | Freq: Three times a day (TID) | ORAL | 1 refills | Status: DC

## 2017-11-03 MED ORDER — OMEPRAZOLE 40 MG PO CPDR: 40.0000 mg | DELAYED_RELEASE_CAPSULE | Freq: Every day | ORAL | 11 refills | Status: DC

## 2017-11-03 NOTE — Patient Instructions (Signed)
Your provider has requested that you go to the basement level for lab work before leaving today. Press "B" on the elevator. The lab is located at the first door on the left as you exit the elevator.  We have sent the following medications to your pharmacy for you to pick up at your convenience:omeprazole and carafate.   Patient advised to avoid spicy, acidic, citrus, chocolate, mints, fruit and fruit juices.  Limit the intake of caffeine, alcohol and Soda.  Don't exercise too soon after eating.  Don't lie down within 3-4 hours of eating.  Elevate the head of your bed.  You have been scheduled for an endoscopy. Please follow written instructions given to you at your visit today. If you use inhalers (even only as needed), please bring them with you on the day of your procedure. Your physician has requested that you go to www.startemmi.com and enter the access code given to you at your visit today. This web site gives a general overview about your procedure. However, you should still follow specific instructions given to you by our office regarding your preparation for the procedure.  Normal BMI (Body Mass Index- based on height and weight) is between 19 and 25. Your BMI today is Body mass index is 36.75 kg/m. Marland Kitchen Please consider follow up  regarding your BMI with your Primary Care Provider.  Thank you for choosing me and [facility].  Venita Lick. Pleas Koch., MD., Clementeen Graham

## 2017-11-03 NOTE — Progress Notes (Signed)
History of Present Illness: This is a 47 year old male referred by Marrian Salvage,* FNP for the evaluation of pancreatitis.  He was hospitalized in April with epigastric pain, right upper quadrant pain nausea and vomiting.  He was evaluated by Dr. Excell Seltzer.  Symptoms were felt due to symptomatic biliary disease and possibly mild pancreatitis.  Lipase was transiently elevated to 52. LFTs were normal.  He underwent laparoscopic cholecystectomy with a negative IOC. I independently reviewed the CT scan and RUQ [place].  Pathology showed chronic cholecystitis and cholelithiasis.  He relates his symptoms completely resolved following his cholecystectomy however about 3 weeks later he developed recurrent problems with upper abdominal pain and pain in his upper back.  The symptoms are present all the time.  He has had difficulty swallowing and pain upon swallowing.  No change in medications.  He states his appetite is been less than normal following his hospital stay.  He has ongoing constipation and intermittent hemorrhoid symptoms. Denies weight loss, diarrhea, change in stool caliber, melena, hematochezia, nausea, vomiting, chest pain.   RUQ [place] 09/17/2017 IMPRESSION: 1. Cholelithiasis without evidence of acute cholecystitis. 2. No bile duct dilatation. 3. Fatty infiltration of the liver  Abd/pelvic CT 09/2017 IMPRESSION: 1. Mild fullness of the pancreatic head with associated slight haziness of the peripancreatic fat, cannot exclude a mild uncomplicated acute pancreatitis. No discrete pancreatic mass. No pancreatic or biliary ductal dilatation. No peripancreatic fluid collections. 2. No evidence of bowel obstruction or acute bowel inflammation. 3. Diffuse hepatic steatosis. 4. Mildly enlarged prostate  Allergies  Allergen Reactions  . Amlodipine Other (See Comments)    Ankle swelling with 43m, ok with 572mdose  . Sulfamethoxazole Rash    fever  . Sulfonamide Derivatives Rash and Other (See  Comments)   Outpatient Medications Prior to Visit  Medication Sig Dispense Refill  . albuterol (VENTOLIN HFA) 108 (90 Base) MCG/ACT inhaler Inhale 2 puffs into the lungs every 6 (six) hours as needed for wheezing or shortness of breath. 3 Inhaler 1  . allopurinol (ZYLOPRIM) 300 MG tablet TAKE 1 TABLET(300 MG) BY MOUTH DAILY 90 tablet 0  . aspirin EC 81 MG tablet Take 1 tablet (81 mg total) by mouth daily. 30 tablet 3  . Blood Glucose Monitoring Suppl (ONETOUCH VERIO FLEX SYSTEM) w/Device KIT 1 Act by Does not apply route 3 (three) times daily with meals as needed. 2 kit 0  . carvedilol (COREG) 3.125 MG tablet Take 1 tablet (3.125 mg total) by mouth 2 (two) times daily with a meal. 180 tablet 0  . cholecalciferol (VITAMIN D) 1000 units tablet Take 1,000 Units by mouth daily.    . colchicine 0.6 MG tablet Take 0.6 mg by mouth every 4 (four) hours as needed (gout pain).    . Marland Kitchenmpagliflozin (JARDIANCE) 10 MG TABS tablet Take 10 mg by mouth daily.    . folic acid (FOLVITE) 1 MG tablet TK 1 T PO QD  1  . glucose blood (ONETOUCH VERIO) test strip Use TID 100 each 11  . irbesartan-hydrochlorothiazide (AVALIDE) 300-12.5 MG tablet Take 1 tablet by mouth daily.    . Marland KitchenevETIRAcetam (KEPPRA) 1000 MG tablet Take 1,000 mg by mouth 2 (two) times daily.    . metFORMIN (GLUCOPHAGE-XR) 500 MG 24 hr tablet TAKE 1 TABLET(500 MG) BY MOUTH TWICE DAILY WITH A MEAL 180 tablet 0  . methotrexate (RHEUMATREX) 2.5 MG tablet Take 10 mg by mouth once a week. Take 4 tablets (1013monce weekly on Mondays. Caution:Chemotherapy.  Protect from light.    . nitroGLYCERIN (NITROSTAT) 0.4 MG SL tablet Place 1 tablet (0.4 mg total) under the tongue every 5 (five) minutes as needed for chest pain. 25 tablet 3  . pioglitazone (ACTOS) 15 MG tablet Take 1 tablet (15 mg total) by mouth daily. 30 tablet 11  . potassium chloride SA (K-DUR,KLOR-CON) 20 MEQ tablet TAKE 1 TABLET(20 MEQ) BY MOUTH TWICE DAILY 180 tablet 0  . rosuvastatin (CRESTOR)  40 MG tablet Take 1 tablet (40 mg total) by mouth daily. 90 tablet 1  . saw palmetto 160 MG capsule Take 160 mg by mouth daily.    . sitaGLIPtin ([name]) 100 MG tablet Take 100 mg by mouth daily.    Marland Kitchen torsemide (DEMADEX) 10 MG tablet Take 2 tablets (20 mg total) by mouth daily. 180 tablet 2  . vitamin B-12 (CYANOCOBALAMIN) 250 MCG tablet Take 250 mcg by mouth daily.    Marland Kitchen anastrozole (ARIMIDEX) 1 MG tablet Take 1 mg by mouth daily.     No facility-administered medications prior to visit.    Past Medical History:  Diagnosis Date  . Anginal pain ([place])   . Arthritis    ? 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 Followed by Dr. Trudie Reed  . Arthritis    "ankles" (04/13/2016)  . Asthma   . CORONARY ARTERY DISEASE    a. cath 05/2014: mild LAD disease, normal LCx and RCA. Risk factor modification recommended.  Marland Kitchen DIABETES MELLITUS, TYPE II   . Edema   . ERECTILE DYSFUNCTION, ORGANIC   . GERD   . Gout, unspecified   . Heart murmur   . HYPERLIPIDEMIA   . HYPERTENSION   . 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 Pericarditis    recurrent  . POLYNEUROPATHY   . PULMONARY SARCOIDOSIS    Mediastinal lymphadenopathy with biospy proven sarcodosis  . Seizures ([place])    "none in 4-5 years; don't know what kind; not related to alcohol" (04/13/2016)  . TESTICULAR HYPOFUNCTION   . TIA (transient ischemic attack)    "I don't remember when" (04/13/2016)   Past Surgical History:  Procedure Laterality Date  . BRONCHOSCOPY  08/21/08  . CARDIAC CATHETERIZATION  06/25/2009   minimal disease  . CARDIAC CATHETERIZATION N/A 04/14/2016   Procedure: Left Heart Cath and Coronary Angiography;  Surgeon: Burnell Blanks, MD;  Location: [facility];  Service: Cardiovascular;  Laterality: N/A;  . CHOLECYSTECTOMY N/A 09/21/2017   Procedure: LAPAROSCOPIC CHOLECYSTECTOMY WITH INTRAOPERATIVE CHOLANGIOGRAM;  Surgeon: Excell Seltzer, MD;  Location: [facility];  Service:  General;  Laterality: N/A;  . LEFT HEART CATHETERIZATION WITH CORONARY ANGIOGRAM N/A 05/18/2014   Procedure: LEFT HEART CATHETERIZATION WITH CORONARY ANGIOGRAM;  Surgeon: Jettie Booze, MD;  Location: [facility];  Service: Cardiovascular;  Laterality: N/A;  . MEDIASTINOSCOPY  11/30/08   Social History   Socioeconomic History  . Marital status: Significant Other    Spouse name: Not on file  . Number of children: 3  . Years of education: Not on file  . Highest education level: Not on file  Occupational History  . Occupation: Unemployed  Social Needs  . Financial resource strain: Not on file  . Food insecurity:    Worry: Not on file    Inability: Not on file  . Transportation needs:    Medical: Not on file    Non-medical: Not on file  Tobacco Use  . Smoking status: Former Smoker    Packs/day:  1.00    Years: 10.00    Pack years: 10.00    Types: Cigarettes    Last attempt to quit: 05/18/2014    Years since quitting: 3.4  . Smokeless tobacco: Never Used  Substance and Sexual Activity  . Alcohol use: Yes    Alcohol/week: 0.0 oz    Comment: 04/13/2016 "glass of wine a couple times/month"  . Drug use: No  . Sexual activity: Yes  Lifestyle  . Physical activity:    Days per week: Not on file    Minutes per session: Not on file  . Stress: Not on file  Relationships  . Social connections:    Talks on phone: Not on file    Gets together: Not on file    Attends religious service: Not on file    Active member of club or organization: Not on file    Attends meetings of clubs or organizations: Not on file    Relationship status: Not on file  Other Topics Concern  . Not on file  Social History Narrative   Married, lives with wife and 3 kids.    Currently student. 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 Diabetes Mother   . Hypertension Mother   . Diabetes Father   . Heart disease Father 58       Fatal MI  .  Heart failure Father        Review of Systems: Pertinent positive and negative review of systems were noted in the above HPI section. All other review of systems were otherwise negative.    Physical Exam: General: Well developed, well nourished, no acute distress Head: Normocephalic and atraumatic Eyes:  sclerae anicteric, EOMI Ears: Normal auditory acuity Mouth: No deformity or lesions Neck: Supple, no masses or thyromegaly Lungs: Clear throughout to auscultation Heart: Regular rate and rhythm; no murmurs, rubs or bruits Abdomen: Soft, non tender and non distended. No masses, hepatosplenomegaly or hernias noted. Normal Bowel sounds Rectal: Musculoskeletal: Symmetrical with no gross deformities  Skin: No lesions on visible extremities Pulses:  Normal pulses noted Extremities: No clubbing, cyanosis, edema or deformities noted Neurological: Alert oriented x 4, grossly nonfocal Cervical Nodes:  No significant cervical adenopathy Inguinal Nodes: No significant inguinal adenopathy Psychological:  Alert and cooperative. Normal mood and affect  Assessment and Recommendations:  1. Epigastric pain, odynophagia, dysphagia. R/O esophagitis, GERD, ulcer, gastritis. Omeprazole 40 mg po qam, Carafate suspension tid ac, antireflux measures. Schedule EGD. The risks (including bleeding, perforation, infection, missed lesions, medication reactions and possible hospitalization or surgery if complications occur), benefits, and alternatives to endoscopy with possible biopsy and possible dilation were discussed with the patient and they consent to proceed.   2. Symptomatic cholelithiasis status post laparoscopic cholecystectomy. CBC, CMP, lipase.   3. Probable mild biliary pancreatitis by CT, clinically resolved.  4. Hepatic steatosis.  Long-term optimal control of diabetes mellitus along with fat modified, carb modified, weight loss diet supervised by his PCP.    cc: Marrian Salvage,  [address] [place], [place] [id number]

## 2017-11-09 ENCOUNTER — Ambulatory Visit: Payer: Self-pay | Admitting: Endocrinology

## 2017-11-09 ENCOUNTER — Telehealth: Payer: Self-pay | Admitting: *Deleted

## 2017-11-09 ENCOUNTER — Encounter: Payer: Self-pay | Admitting: Gastroenterology

## 2017-11-09 ENCOUNTER — Ambulatory Visit (AMBULATORY_SURGERY_CENTER): Payer: Medicare Other | Admitting: Gastroenterology

## 2017-11-09 ENCOUNTER — Other Ambulatory Visit: Payer: Self-pay

## 2017-11-09 VITALS — BP 134/85 | HR 77 | Temp 97.5°F | Resp 22 | Ht 72.0 in | Wt 271.0 lb

## 2017-11-09 DIAGNOSIS — K319 Disease of stomach and duodenum, unspecified: Secondary | ICD-10-CM | POA: Diagnosis not present

## 2017-11-09 DIAGNOSIS — R1013 Epigastric pain: Secondary | ICD-10-CM | POA: Diagnosis not present

## 2017-11-09 DIAGNOSIS — R1310 Dysphagia, unspecified: Secondary | ICD-10-CM

## 2017-11-09 DIAGNOSIS — K2950 Unspecified chronic gastritis without bleeding: Secondary | ICD-10-CM | POA: Diagnosis not present

## 2017-11-09 DIAGNOSIS — K219 Gastro-esophageal reflux disease without esophagitis: Secondary | ICD-10-CM | POA: Diagnosis not present

## 2017-11-09 MED ORDER — SODIUM CHLORIDE 0.9 % IV SOLN: 500.0000 mL | Freq: Once | INTRAVENOUS | Status: DC

## 2017-11-09 NOTE — Patient Instructions (Signed)
    YOU HAD AN ENDOSCOPIC PROCEDURE TODAY AT THE [facility]:   Refer to the procedure report that was given to you for any specific questions about what was found during the examination.  If the procedure report does not answer your questions, please call your gastroenterologist to clarify.  If you requested that your care partner not be given the details of your procedure findings, then the procedure report has been included in a sealed envelope for you to review at your convenience later.  YOU SHOULD EXPECT: Some feelings of bloating in the abdomen. Passage of more gas than usual.  Walking can help get rid of the air that was put into your GI tract during the procedure and reduce the bloating. If you had a lower endoscopy (such as a colonoscopy or flexible sigmoidoscopy) you may notice spotting of blood in your stool or on the toilet paper. If you underwent a bowel prep for your procedure, you may not have a normal bowel movement for a few days.  Please Note:  You might notice some irritation and congestion in your nose or some drainage.  This is from the oxygen used during your procedure.  There is no need for concern and it should clear up in a day or so.  SYMPTOMS TO REPORT IMMEDIATELY:    Following upper endoscopy (EGD)  Vomiting of blood or coffee ground material  New chest pain or pain under the shoulder blades  Painful or persistently difficult swallowing  New shortness of breath  Fever of 100F or higher  Black, tarry-looking stools  For urgent or emergent issues, a gastroenterologist can be reached at any hour by calling [phone].   DIET:  CLEAR LIQUIDS ONLY UNTIL 1230. AFTER 12:30 ONLY SOFT FOODS UNTIL THE AM. RESUME YOUR REGULAR DIET IN THE AM.  ACTIVITY:  You should plan to take it easy for the rest of today and you should NOT DRIVE or use heavy machinery until tomorrow (because of the sedation medicines used during the test).    FOLLOW UP: Our staff  will call the number listed on your records the next business day following your procedure to check on you and address any questions or concerns that you may have regarding the information given to you following your procedure. If we do not reach you, we will leave a message.  However, if you are feeling well and you are not experiencing any problems, there is no need to return our call.  We will assume that you have returned to your regular daily activities without incident.  If any biopsies were taken you will be contacted by phone or by letter within the next 1-3 weeks.  Please call us at [phone] if you have not heard about the biopsies in 3 weeks.    SIGNATURES/CONFIDENTIALITY: You and/or your care partner have signed paperwork which will be entered into your electronic medical record.  These signatures attest to the fact that that the information above on your After Visit Summary has been reviewed and is understood.  Full responsibility of the confidentiality of this discharge information lies with you and/or your care-partner.

## 2017-11-09 NOTE — Op Note (Signed)
[facility] Patient Name: [name] Procedure Date: 11/09/2017 9:55 AM MRN: [id number] Endoscopist: Meryl Dare , MD Age: 47 Referring MD:  Date of Birth: 1971/02/19 Gender: Male Account #: [id number] Procedure:                Upper GI endoscopy Indications:              Epigastric abdominal pain, Dysphagia, Odynophagia Medicines:                Monitored Anesthesia Care Procedure:                Pre-Anesthesia Assessment:                           - Prior to the procedure, a History and Physical                            was performed, and patient medications and                            allergies were reviewed. The patient's tolerance of                            previous anesthesia was also reviewed. The risks                            and benefits of the procedure and the sedation                            options and risks were discussed with the patient.                            All questions were answered, and informed consent                            was obtained. Prior Anticoagulants: The patient has                            taken no previous anticoagulant or antiplatelet                            agents. ASA Grade Assessment: II - A patient with                            mild systemic disease. After reviewing the risks                            and benefits, the patient was deemed in                            satisfactory condition to undergo the procedure.                           After obtaining informed consent, the endoscope was  passed under direct vision. Throughout the                            procedure, the patient's blood pressure, pulse, and                            oxygen saturations were monitored continuously. The                            Endoscope was introduced through the mouth, and                            advanced to the second part of duodenum. The upper                            GI  endoscopy was accomplished without difficulty.                            The patient tolerated the procedure well. Scope In: Scope Out: Findings:                 No endoscopic abnormality was evident in the                            esophagus to explain the patient's complaint of                            dysphagia. It was decided, however, to proceed with                            dilation of the entire esophagus. A guidewire was                            placed and the scope was withdrawn. Dilation was                            performed with a Savary dilator with no resistance                            at 16 mm.                           Patchy mildly erythematous mucosa without bleeding                            was found in the entire examined stomach. Biopsies                            were taken with a cold forceps for histology.                           The exam of the stomach was otherwise normal.  The duodenal bulb and second portion of the                            duodenum were normal. Complications:            No immediate complications. Estimated Blood Loss:     Estimated blood loss was minimal. Impression:               - No endoscopic esophageal abnormality to explain                            patient's dysphagia or odynophagia. Esophagus                            dilated. Dilated.                           - Erythematous mucosa in the stomach. Biopsied.                           - Normal duodenal bulb and second portion of the                            duodenum. Recommendation:           - Patient has a contact number available for                            emergencies. The signs and symptoms of potential                            delayed complications were discussed with the                            patient. Return to normal activities tomorrow.                            Written discharge instructions were provided to the                             patient.                           - Clear liquid diet for 2 hours, then advance as                            tolerated to soft diet today.                           - Continue present medications.                           - Await pathology results. Meryl Dare, MD 11/09/2017 10:15:39 AM This report has been signed electronically.

## 2017-11-09 NOTE — Progress Notes (Signed)
A and O x3. Report to RN. Tolerated MAC anesthesia well.Teeth unchanged after procedure.

## 2017-11-09 NOTE — Progress Notes (Signed)
Pt. Reports no change in his medical or surgical history since his pre-visit 11/03/2017.

## 2017-11-09 NOTE — Progress Notes (Signed)
Called to room to assist during endoscopic procedure.  Patient ID and intended procedure confirmed with present staff. Received instructions for my participation in the procedure from the performing physician.  

## 2017-11-09 NOTE — Telephone Encounter (Signed)
Patient's inhaler found under his stretcher. Called the patient to inform and placed inhaler at front desk with receptionist.

## 2017-11-10 ENCOUNTER — Telehealth: Payer: Self-pay

## 2017-11-10 NOTE — Telephone Encounter (Signed)
  Follow up Call-  Call back number 11/09/2017  Post procedure Call Back phone  # [phone]  Permission to leave phone message Yes  Some recent data might be hidden     Patient questions:  Do you have a fever, pain , or abdominal swelling? No. Pain Score  0 *  Have you tolerated food without any problems? Yes.    Have you been able to return to your normal activities? Yes.    Do you have any questions about your discharge instructions: Diet   No. Medications  No. Follow up visit  No.  Do you have questions or concerns about your Care? No.  Actions: * If pain score is 4 or above: No action needed, pain <4.  No problems noted per pt. maw

## 2017-11-11 ENCOUNTER — Encounter: Payer: Self-pay | Admitting: Gastroenterology

## 2017-11-11 ENCOUNTER — Encounter: Payer: Self-pay | Admitting: Endocrinology

## 2017-11-11 ENCOUNTER — Ambulatory Visit (INDEPENDENT_AMBULATORY_CARE_PROVIDER_SITE_OTHER): Payer: Medicare Other | Admitting: Endocrinology

## 2017-11-11 VITALS — BP 110/80 | HR 91 | Wt 263.2 lb

## 2017-11-11 DIAGNOSIS — E669 Obesity, unspecified: Secondary | ICD-10-CM

## 2017-11-11 DIAGNOSIS — E1169 Type 2 diabetes mellitus with other specified complication: Secondary | ICD-10-CM | POA: Diagnosis not present

## 2017-11-11 MED ORDER — EMPAGLIFLOZIN 25 MG PO TABS: 25.0000 mg | ORAL_TABLET | Freq: Every day | ORAL | 3 refills | Status: DC

## 2017-11-11 MED ORDER — PIOGLITAZONE HCL 30 MG PO TABS: 30.0000 mg | ORAL_TABLET | Freq: Every day | ORAL | 3 refills | Status: DC

## 2017-11-11 NOTE — Patient Instructions (Signed)
I have sent prescriptions to your pharmacy, to increase the jardiance and pioglitizone. Please continue the same medications. check your blood sugar once a day.  vary the time of day when you check, between before the 3 meals, and at bedtime.  also check if you have symptoms of your blood sugar being too high or too low.  please keep a record of the readings and bring it to your next appointment here (or you can bring the meter itself).  You can write it on any piece of paper.  please call us sooner if your blood sugar goes below 70, or if you have a lot of readings over 200. Please come back for a follow-up appointment in 3 months

## 2017-11-11 NOTE — Progress Notes (Signed)
Subjective:    Patient ID: [name], male    DOB: 10-09-1970, 47 y.o.   MRN: [id number]  HPI Pt returns for f/u of diabetes mellitus:  DM type: 2 Dx'ed: 5009 Complications: CAD Therapy: 4 oral meds.   DKA: never Severe hypoglycemia: never.  Pancreatitis: 2019 (GB) Pancreatic imaging: 2011 CT: mild fatty replacement in the head and uncinate process.   Interval history: he takes meds as rx'ed.  pt states he feels well in general.   Past Medical History:  Diagnosis Date  . Anginal pain ([place])   . Arthritis    ? 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 Followed by Dr. Trudie Reed  . Arthritis    "ankles" (04/13/2016)  . Asthma   . CORONARY ARTERY DISEASE    a. cath 05/2014: mild LAD disease, normal LCx and RCA. Risk factor modification recommended.  Marland Kitchen DIABETES MELLITUS, TYPE II   . Edema   . ERECTILE DYSFUNCTION, ORGANIC   . GERD   . Gout, unspecified   . Heart murmur   . HYPERLIPIDEMIA   . HYPERTENSION   . 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 Pericarditis    recurrent  . POLYNEUROPATHY   . PULMONARY SARCOIDOSIS    Mediastinal lymphadenopathy with biospy proven sarcodosis  . Seizures ([place])    "none in 4-5 years; don't know what kind; not related to alcohol" (04/13/2016)  . TESTICULAR HYPOFUNCTION   . TIA (transient ischemic attack)    "I don't remember when" (04/13/2016)    Past Surgical History:  Procedure Laterality Date  . BRONCHOSCOPY  08/21/08  . CARDIAC CATHETERIZATION  06/25/2009   minimal disease  . CARDIAC CATHETERIZATION N/A 04/14/2016   Procedure: Left Heart Cath and Coronary Angiography;  Surgeon: Burnell Blanks, MD;  Location: [facility];  Service: Cardiovascular;  Laterality: N/A;  . CHOLECYSTECTOMY N/A 09/21/2017   Procedure: LAPAROSCOPIC CHOLECYSTECTOMY WITH INTRAOPERATIVE CHOLANGIOGRAM;  Surgeon: Excell Seltzer, MD;  Location: [facility];  Service: General;  Laterality: N/A;  . LEFT HEART  CATHETERIZATION WITH CORONARY ANGIOGRAM N/A 05/18/2014   Procedure: LEFT HEART CATHETERIZATION WITH CORONARY ANGIOGRAM;  Surgeon: Jettie Booze, MD;  Location: [facility];  Service: Cardiovascular;  Laterality: N/A;  . MEDIASTINOSCOPY  11/30/08    Social History   Socioeconomic History  . Marital status: Significant Other    Spouse name: Not on file  . Number of children: 3  . Years of education: Not on file  . Highest education level: Not on file  Occupational History  . Occupation: Unemployed  Social Needs  . Financial resource strain: Not on file  . Food insecurity:    Worry: Not on file    Inability: Not on file  . Transportation needs:    Medical: Not on file    Non-medical: Not on file  Tobacco Use  . Smoking status: Former Smoker    Packs/day: 1.00    Years: 10.00    Pack years: 10.00    Types: Cigarettes    Last attempt to quit: 05/18/2014    Years since quitting: 3.4  . Smokeless tobacco: Never Used  Substance and Sexual Activity  . Alcohol use: Yes    Alcohol/week: 0.0 oz    Comment: 04/13/2016 "glass of wine a couple times/month"  . Drug use: No  . Sexual activity: Yes  Lifestyle  . Physical activity:    Days per week: Not on file    Minutes per session:  Not on file  . Stress: Not on file  Relationships  . Social connections:    Talks on phone: Not on file    Gets together: Not on file    Attends religious service: Not on file    Active member of club or organization: Not on file    Attends meetings of clubs or organizations: Not on file    Relationship status: Not on file  . Intimate partner violence:    Fear of current or ex partner: Not on file    Emotionally abused: Not on file    Physically abused: Not on file    Forced sexual activity: Not on file  Other Topics Concern  . Not on file  Social History Narrative   Married, lives with wife and 3 kids.    Currently student. 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 albuterol (VENTOLIN HFA) 108 (90 Base) MCG/ACT inhaler Inhale 2 puffs into the lungs every 6 (six) hours as needed for wheezing or shortness of breath. 3 Inhaler 1  . allopurinol (ZYLOPRIM) 300 MG tablet TAKE 1 TABLET(300 MG) BY MOUTH DAILY 90 tablet 0  . anastrozole (ARIMIDEX) 1 MG tablet Take 1 mg by mouth daily.    Marland Kitchen aspirin EC 81 MG tablet Take 1 tablet (81 mg total) by mouth daily. 30 tablet 3  . Blood Glucose Monitoring Suppl (ONETOUCH VERIO FLEX SYSTEM) w/Device KIT 1 Act by Does not apply route 3 (three) times daily with meals as needed. 2 kit 0  . carvedilol (COREG) 3.125 MG tablet Take 1 tablet (3.125 mg total) by mouth 2 (two) times daily with a meal. 180 tablet 0  . cholecalciferol (VITAMIN D) 1000 units tablet Take 1,000 Units by mouth daily.    . colchicine 0.6 MG tablet Take 0.6 mg by mouth every 4 (four) hours as needed (gout pain).    . folic acid (FOLVITE) 1 MG tablet TK 1 T PO QD  1  . glucose blood (ONETOUCH VERIO) test strip Use TID 100 each 11  . irbesartan-hydrochlorothiazide (AVALIDE) 300-12.5 MG tablet Take 1 tablet by mouth daily.    Marland Kitchen levETIRAcetam (KEPPRA) 1000 MG tablet Take 1,000 mg by mouth 2 (two) times daily.    . metFORMIN (GLUCOPHAGE-XR) 500 MG 24 hr tablet TAKE 1 TABLET(500 MG) BY MOUTH TWICE DAILY WITH A MEAL 180 tablet 0  . methotrexate (RHEUMATREX) 2.5 MG tablet Take 10 mg by mouth once a week. Take 4 tablets (28m) once weekly on Mondays. Caution:Chemotherapy. Protect from light.    . nitroGLYCERIN (NITROSTAT) 0.4 MG SL tablet Place 1 tablet (0.4 mg total) under the tongue every 5 (five) minutes as needed for chest pain. 25 tablet 3  . omeprazole (PRILOSEC) 40 MG capsule Take 1 capsule (40 mg total) by mouth daily. 30 capsule 11  . potassium chloride SA (K-DUR,KLOR-CON) 20 MEQ tablet TAKE 1 TABLET(20 MEQ) BY MOUTH TWICE DAILY 180 tablet 0  . rosuvastatin (CRESTOR) 40 MG tablet  Take 1 tablet (40 mg total) by mouth daily. 90 tablet 1  . saw palmetto 160 MG capsule Take 160 mg by mouth daily.    . sitaGLIPtin ([name]) 100 MG tablet Take 100 mg by mouth daily.    . sucralfate (CARAFATE) 1 g tablet TAKE 1 TABLET(1 GRAM) BY MOUTH FOUR TIMES DAILY AT BEDTIME WITH MEALS 360 tablet 1  . torsemide (DEMADEX)  10 MG tablet Take 2 tablets (20 mg total) by mouth daily. 180 tablet 2  . vitamin B-12 (CYANOCOBALAMIN) 250 MCG tablet Take 250 mcg by mouth daily.     Current Facility-Administered Medications on File Prior to Visit  Medication Dose Route Frequency Provider Last Rate Last Dose  . 0.9 %  sodium chloride infusion  500 mL Intravenous Once Ladene Artist, MD        Allergies  Allergen Reactions  . Amlodipine Other (See Comments)    Ankle swelling with 49m, ok with 555mdose  . Sulfamethoxazole Rash    fever  . Sulfonamide Derivatives Rash and Other (See Comments)    Family History  Problem Relation Age of Onset  . Diabetes Mother   . Hypertension Mother   . Diabetes Father   . Heart disease Father 463     Fatal MI  . Heart failure Father     BP 110/80   Pulse 91   Wt 263 lb 3.2 oz (119.4 kg)   SpO2 95%   BMI 35.70 kg/m    Review of Systems He has lost a few lbs, due to recent illness (he feels better now).  He has lost 33 lbs since last ov.      Objective:   Physical Exam VITAL SIGNS:  See vs page GENERAL: no distress Pulses: dorsalis pedis intact bilat.   MSK: no deformity of the feet CV: no leg edema Skin:  no ulcer on the feet.  normal color and temp on the feet. Neuro: sensation is intact to touch on the feet  Lab Results  Component Value Date   HGBA1C 7.2 09/27/2017   Lab Results  Component Value Date   CREATININE 0.88 11/03/2017   BUN 13 11/03/2017   NA 139 11/03/2017   K 3.7 11/03/2017   CL 100 11/03/2017   CO2 30 11/03/2017       Assessment & Plan:  Type 2 DM, with CAD: he needs increased rx Pancreatitis, new.  Was due  to GB stone, so this does not limit DM rx options.   Weight loss: this has helped rx of DM. He is advised not to regain.   Patient Instructions  I have sent prescriptions to your pharmacy, to increase the jardiance and pioglitizone. Please continue the same medications. check your blood sugar once a day.  vary the time of day when you check, between before the 3 meals, and at bedtime.  also check if you have symptoms of your blood sugar being too high or too low.  please keep a record of the readings and bring it to your next appointment here (or you can bring the meter itself).  You can write it on any piece of paper.  please call [place] if your blood sugar goes below 70, or if you have a lot of readings over 200. Please come back for a follow-up appointment in 3 months

## 2017-11-17 ENCOUNTER — Encounter: Payer: Self-pay | Admitting: Internal Medicine

## 2017-11-17 ENCOUNTER — Ambulatory Visit: Payer: Medicare Other | Admitting: Internal Medicine

## 2017-11-17 ENCOUNTER — Other Ambulatory Visit (INDEPENDENT_AMBULATORY_CARE_PROVIDER_SITE_OTHER): Payer: Medicare Other

## 2017-11-17 VITALS — BP 126/82 | HR 87 | Temp 98.3°F | Ht 72.0 in | Wt 267.0 lb

## 2017-11-17 DIAGNOSIS — E1169 Type 2 diabetes mellitus with other specified complication: Secondary | ICD-10-CM | POA: Diagnosis not present

## 2017-11-17 DIAGNOSIS — I10 Essential (primary) hypertension: Secondary | ICD-10-CM | POA: Diagnosis not present

## 2017-11-17 DIAGNOSIS — N410 Acute prostatitis: Secondary | ICD-10-CM

## 2017-11-17 DIAGNOSIS — E669 Obesity, unspecified: Secondary | ICD-10-CM | POA: Diagnosis not present

## 2017-11-17 LAB — URINALYSIS, ROUTINE W REFLEX MICROSCOPIC
Bilirubin Urine: NEGATIVE
Hgb urine dipstick: NEGATIVE
Ketones, ur: NEGATIVE
Leukocytes, UA: NEGATIVE
Nitrite: NEGATIVE
RBC / HPF: NONE SEEN (ref 0–?)
Specific Gravity, Urine: 1.01 (ref 1.000–1.030)
Total Protein, Urine: NEGATIVE
Urine Glucose: >=1000 — AB
Urobilinogen, UA: 0.2 (ref 0.0–1.0)
WBC, UA: NONE SEEN (ref 0–?)
pH: 5.5 (ref 5.0–8.0)

## 2017-11-17 MED ORDER — DOXYCYCLINE HYCLATE 100 MG PO TABS: 100.0000 mg | ORAL_TABLET | Freq: Two times a day (BID) | ORAL | 0 refills | Status: DC

## 2017-11-17 NOTE — Progress Notes (Signed)
 Subjective:    Patient ID: [name], male    DOB: 01/09/1971, 46 y.o.   MRN: [id number]  HPI  Here with recent onset symptoms x 5 days - has c/o extreme urinary frequency "about 50 times" , tingling and lower abd pain/bilat pelvic as well as lower back pain and perineal pain, assoc with marked fatigue, feeling some warm, but no high fever, chills, n/v and Denies worsening reflux, abd pain, dysphagia, bowel change or blood.  Denies urinary symptoms such as dysuria, urgency, flank pain, hematuria or n/v, fever, chills.  No hx of prostatitis.  Pt denies new neurological symptoms such as new headache, or facial or extremity weakness or numbness   Pt denies polydipsia, polyuria Past Medical History:  Diagnosis Date  . Anginal pain ([facility])   . Arthritis    ? 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 Followed by Dr. Hawkes  . Arthritis    "ankles" (04/13/2016)  . Asthma   . CORONARY ARTERY DISEASE    a. cath 05/2014: mild LAD disease, normal LCx and RCA. Risk factor modification recommended.  . DIABETES MELLITUS, TYPE II   . Edema   . ERECTILE DYSFUNCTION, ORGANIC   . GERD   . Gout, unspecified   . Heart murmur   . HYPERLIPIDEMIA   . HYPERTENSION   . 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 Pericarditis    recurrent  . POLYNEUROPATHY   . PULMONARY SARCOIDOSIS    Mediastinal lymphadenopathy with biospy proven sarcodosis  . Seizures ([facility])    "none in 4-5 years; don't know what kind; not related to alcohol" (04/13/2016)  . TESTICULAR HYPOFUNCTION   . TIA (transient ischemic attack)    "I don't remember when" (04/13/2016)   Past Surgical History:  Procedure Laterality Date  . BRONCHOSCOPY  08/21/08  . CARDIAC CATHETERIZATION  06/25/2009   minimal disease  . CARDIAC CATHETERIZATION N/A 04/14/2016   Procedure: Left Heart Cath and Coronary Angiography;  Surgeon: Christopher D McAlhany, MD;  Location: [facility];  Service: Cardiovascular;   Laterality: N/A;  . CHOLECYSTECTOMY N/A 09/21/2017   Procedure: LAPAROSCOPIC CHOLECYSTECTOMY WITH INTRAOPERATIVE CHOLANGIOGRAM;  Surgeon: Hoxworth, Benjamin, MD;  Location: [facility];  Service: General;  Laterality: N/A;  . LEFT HEART CATHETERIZATION WITH CORONARY ANGIOGRAM N/A 05/18/2014   Procedure: LEFT HEART CATHETERIZATION WITH CORONARY ANGIOGRAM;  Surgeon: Jayadeep S Varanasi, MD;  Location: [facility];  Service: Cardiovascular;  Laterality: N/A;  . MEDIASTINOSCOPY  11/30/08    reports that he quit smoking about 3 years ago. His smoking use included cigarettes. He has a 10.00 pack-year smoking history. He has never used smokeless tobacco. He reports that he drinks alcohol. He reports that he does not use drugs. family history includes Diabetes in his father and mother; Heart disease (age of onset: 46) in his father; Heart failure in his father; Hypertension in his mother. Allergies  Allergen Reactions  . Amlodipine Other (See Comments)    Ankle swelling with 10mg, ok with 5mg dose  . Sulfamethoxazole Rash    fever  . Sulfonamide Derivatives Rash and Other (See Comments)   Current Outpatient Medications on File Prior to Visit  Medication Sig Dispense Refill  . albuterol (VENTOLIN HFA) 108 (90 Base) MCG/ACT inhaler Inhale 2 puffs into the lungs every 6 (six) hours as needed for wheezing or shortness of breath. 3 Inhaler 1  . allopurinol (ZYLOPRIM) 300 MG tablet TAKE 1 TABLET(300 MG) BY MOUTH DAILY 90   tablet 0  . anastrozole (ARIMIDEX) 1 MG tablet Take 1 mg by mouth daily.    Marland Kitchen aspirin EC 81 MG tablet Take 1 tablet (81 mg total) by mouth daily. 30 tablet 3  . Blood Glucose Monitoring Suppl (ONETOUCH VERIO FLEX SYSTEM) w/Device KIT 1 Act by Does not apply route 3 (three) times daily with meals as needed. 2 kit 0  . carvedilol (COREG) 3.125 MG tablet Take 1 tablet (3.125 mg total) by mouth 2 (two) times daily with a meal. 180 tablet 0  . cholecalciferol (VITAMIN D) 1000 units tablet Take 1,000  Units by mouth daily.    . colchicine 0.6 MG tablet Take 0.6 mg by mouth every 4 (four) hours as needed (gout pain).    Marland Kitchen empagliflozin (JARDIANCE) 25 MG TABS tablet Take 25 mg by mouth daily. 90 tablet 3  . folic acid (FOLVITE) 1 MG tablet TK 1 T PO QD  1  . glucose blood (ONETOUCH VERIO) test strip Use TID 100 each 11  . irbesartan-hydrochlorothiazide (AVALIDE) 300-12.5 MG tablet Take 1 tablet by mouth daily.    Marland Kitchen levETIRAcetam (KEPPRA) 1000 MG tablet Take 1,000 mg by mouth 2 (two) times daily.    . metFORMIN (GLUCOPHAGE-XR) 500 MG 24 hr tablet TAKE 1 TABLET(500 MG) BY MOUTH TWICE DAILY WITH A MEAL 180 tablet 0  . methotrexate (RHEUMATREX) 2.5 MG tablet Take 10 mg by mouth once a week. Take 4 tablets (36m) once weekly on Mondays. Caution:Chemotherapy. Protect from light.    . nitroGLYCERIN (NITROSTAT) 0.4 MG SL tablet Place 1 tablet (0.4 mg total) under the tongue every 5 (five) minutes as needed for chest pain. 25 tablet 3  . omeprazole (PRILOSEC) 40 MG capsule Take 1 capsule (40 mg total) by mouth daily. 30 capsule 11  . pioglitazone (ACTOS) 30 MG tablet Take 1 tablet (30 mg total) by mouth daily. 90 tablet 3  . potassium chloride SA (K-DUR,KLOR-CON) 20 MEQ tablet TAKE 1 TABLET(20 MEQ) BY MOUTH TWICE DAILY 180 tablet 0  . rosuvastatin (CRESTOR) 40 MG tablet Take 1 tablet (40 mg total) by mouth daily. 90 tablet 1  . saw palmetto 160 MG capsule Take 160 mg by mouth daily.    . sitaGLIPtin ([name]) 100 MG tablet Take 100 mg by mouth daily.    . sucralfate (CARAFATE) 1 g tablet TAKE 1 TABLET(1 GRAM) BY MOUTH FOUR TIMES DAILY AT BEDTIME WITH MEALS 360 tablet 1  . torsemide (DEMADEX) 10 MG tablet Take 2 tablets (20 mg total) by mouth daily. 180 tablet 2  . vitamin B-12 (CYANOCOBALAMIN) 250 MCG tablet Take 250 mcg by mouth daily.     No current facility-administered medications on file prior to visit.    Review of Systems  Constitutional: Negative for other unusual diaphoresis or sweats HENT:  Negative for ear discharge or swelling Eyes: Negative for other worsening visual disturbances Respiratory: Negative for stridor or other swelling  Gastrointestinal: Negative for worsening distension or other blood Genitourinary: Negative for retention or other urinary change Musculoskeletal: Negative for other MSK pain or swelling Skin: Negative for color change or other new lesions Neurological: Negative for worsening tremors and other numbness  Psychiatric/Behavioral: Negative for worsening agitation or other fatigue All other system neg per pt    Objective:   Physical Exam BP 126/82   Pulse 87   Temp 98.3 F (36.8 C) (Oral)   Ht 6' (1.829 m)   Wt 267 lb (121.1 kg)   SpO2 96%   BMI  36.21 kg/m  VS noted, non toxic but fatigued Constitutional: Pt appears in NAD HENT: Head: NCAT.  Right Ear: External ear normal.  Left Ear: External ear normal.  Eyes: . Pupils are equal, round, and reactive to light. Conjunctivae and EOM are normal Nose: without d/c or deformity Neck: Neck supple. Gross normal ROM Cardiovascular: Normal rate and regular rhythm.   Pulmonary/Chest: Effort normal and breath sounds without rales or wheezing.  Abd:  Soft, NT, ND, + BS, no organomegaly Neurological: Pt is alert. At baseline orientation, motor grossly intact Skin: Skin is warm. No rashes, other new lesions, no LE edema Psychiatric: Pt behavior is normal without agitation  No other exam findings  Lab Results  Component Value Date   WBC 6.4 11/03/2017   HGB 15.1 11/03/2017   HCT 44.2 11/03/2017   PLT 265.0 11/03/2017   GLUCOSE 213 (H) 11/03/2017   CHOL 176 09/19/2017   TRIG 97 09/19/2017   HDL 34 (L) 09/19/2017   LDLCALC 123 (H) 09/19/2017   ALT 32 11/03/2017   AST 22 11/03/2017   NA 139 11/03/2017   K 3.7 11/03/2017   CL 100 11/03/2017   CREATININE 0.88 11/03/2017   BUN 13 11/03/2017   CO2 30 11/03/2017   TSH 0.66 03/01/2017   PSA 1.33 05/07/2016   INR 0.89 05/24/2016   HGBA1C 7.2  09/27/2017   MICROALBUR 2.0 (H) 06/29/2017       Assessment & Plan:   

## 2017-11-17 NOTE — Assessment & Plan Note (Signed)
Clinical dx, Mild to mod, for urine cx, for empiric antibx course, will hold on imaging for now, to f/u any worsening symptoms or concerns

## 2017-11-17 NOTE — Patient Instructions (Addendum)
Please take all new medication as prescribed - the antibiotic  Please continue all other medications as before, and refills have been done if requested.  Please have the pharmacy call with any other refills you may need.  Please keep your appointments with your specialists as you may have planned  Please go to the LAB in the Basement (turn left off the elevator) for the tests to be done today - just the urine testing today  You will be contacted by phone if any changes need to be made immediately.  Otherwise, you will receive a letter about your results with an explanation, but please check with MyChart first.  Please remember to sign up for MyChart if you have not done so, as this will be important to you in the future with finding out test results, communicating by private email, and scheduling acute appointments online when needed.  

## 2017-11-17 NOTE — Assessment & Plan Note (Signed)
stable overall by history and exam, recent data reviewed with pt, and pt to continue medical treatment as before,  to f/u any worsening symptoms or concerns BP Readings from Last 3 Encounters:  11/17/17 126/82  11/11/17 110/80  11/09/17 134/85

## 2017-11-17 NOTE — Assessment & Plan Note (Signed)
stable overall by history and exam, recent data reviewed with pt, and pt to continue medical treatment as before,  to f/u any worsening symptoms or concerns Lab Results  Component Value Date   HGBA1C 7.2 09/27/2017

## 2017-11-18 LAB — URINE CULTURE
MICRO NUMBER:: 90705028
Result:: NO GROWTH
SPECIMEN QUALITY:: ADEQUATE

## 2017-11-24 ENCOUNTER — Telehealth: Payer: Self-pay | Admitting: Internal Medicine

## 2017-11-24 NOTE — Telephone Encounter (Signed)
Patient should follow up with PCP.

## 2017-11-24 NOTE — Telephone Encounter (Signed)
Copied from CRM [id number]. Topic: Quick Communication - See Telephone Encounter >> Nov 24, 2017  1:18 PM Terisa Starr wrote: CRM for notification. See Telephone encounter for: 11/24/17.  Patient said that he saw Dr Jonny Ruiz on 6/12 for back pain. He said that he is still in a lot pain and wants to know what Dr Yetta Barre or Dr Jonny Ruiz suggest he do? Please advise  Call back @ [phone]

## 2017-11-25 NOTE — Telephone Encounter (Signed)
Called pt and he stated that he is still having pain and is wanting to know if he can add tylenol or ibuprofen for the pain. He was given 15 days of doxycycline for acute prostatitis.   Please advise what pt can do and I will call pt back with advise.

## 2017-11-27 NOTE — Telephone Encounter (Signed)
Yes, he can 

## 2017-11-29 NOTE — Telephone Encounter (Signed)
Pt informed of recommendation.  

## 2017-11-30 NOTE — Telephone Encounter (Signed)
Following message received from covermymedsDELMER [name]: [id number]  Outcome  Approved on April 24  Effective from 09/28/2017 through 09/29/2018.

## 2017-12-03 ENCOUNTER — Other Ambulatory Visit: Payer: Self-pay | Admitting: Internal Medicine

## 2017-12-03 DIAGNOSIS — I10 Essential (primary) hypertension: Secondary | ICD-10-CM

## 2017-12-22 DIAGNOSIS — N410 Acute prostatitis: Secondary | ICD-10-CM | POA: Diagnosis not present

## 2017-12-31 DIAGNOSIS — E291 Testicular hypofunction: Secondary | ICD-10-CM | POA: Diagnosis not present

## 2018-01-07 DIAGNOSIS — N401 Enlarged prostate with lower urinary tract symptoms: Secondary | ICD-10-CM | POA: Diagnosis not present

## 2018-01-07 DIAGNOSIS — E291 Testicular hypofunction: Secondary | ICD-10-CM | POA: Diagnosis not present

## 2018-01-07 DIAGNOSIS — N5201 Erectile dysfunction due to arterial insufficiency: Secondary | ICD-10-CM | POA: Diagnosis not present

## 2018-01-07 DIAGNOSIS — R350 Frequency of micturition: Secondary | ICD-10-CM | POA: Diagnosis not present

## 2018-01-28 ENCOUNTER — Telehealth: Payer: Self-pay | Admitting: *Deleted

## 2018-01-28 ENCOUNTER — Ambulatory Visit: Payer: Medicare Other | Admitting: Nurse Practitioner

## 2018-01-28 ENCOUNTER — Encounter: Payer: Self-pay | Admitting: Nurse Practitioner

## 2018-01-28 VITALS — BP 118/78 | HR 83 | Temp 98.0°F | Ht 72.0 in | Wt 259.0 lb

## 2018-01-28 DIAGNOSIS — H65193 Other acute nonsuppurative otitis media, bilateral: Secondary | ICD-10-CM

## 2018-01-28 DIAGNOSIS — J069 Acute upper respiratory infection, unspecified: Secondary | ICD-10-CM | POA: Diagnosis not present

## 2018-01-28 DIAGNOSIS — H1033 Unspecified acute conjunctivitis, bilateral: Secondary | ICD-10-CM | POA: Diagnosis not present

## 2018-01-28 MED ORDER — OLOPATADINE HCL 0.2 % OP SOLN: 1.0000 [drp] | Freq: Every day | OPHTHALMIC | 0 refills | Status: DC

## 2018-01-28 MED ORDER — AMOXICILLIN-POT CLAVULANATE 875-125 MG PO TABS: 1.0000 | ORAL_TABLET | Freq: Two times a day (BID) | ORAL | 0 refills | Status: DC

## 2018-01-28 NOTE — Patient Instructions (Signed)
Please start augmentin twice daily for 5 days  Please start pataday eyedrops 1 drop to each eye once daily for the next week  Please follow up for fevers >101, worsening symptoms or if your symptoms continue after antibiotics.   Otitis Media, Adult Otitis media is redness, soreness, and puffiness (swelling) in the space just behind your eardrum (middle ear). It may be caused by allergies or infection. It often happens along with a cold. Follow these instructions at home:  Take your medicine as told. Finish it even if you start to feel better.  Only take over-the-counter or prescription medicines for pain, discomfort, or fever as told by your doctor.  Follow up with your doctor as told. Contact a doctor if:  You have otitis media only in one ear, or bleeding from your nose, or both.  You notice a lump on your neck.  You are not getting better in 3-5 days.  You feel worse instead of better. Get help right away if:  You have pain that is not helped with medicine.  You have puffiness, redness, or pain around your ear.  You get a stiff neck.  You cannot move part of your face (paralysis).  You notice that the bone behind your ear hurts when you touch it. This information is not intended to replace advice given to you by your health care provider. Make sure you discuss any questions you have with your health care provider. Document Released: 11/11/2007 Document Revised: 10/31/2015 Document Reviewed: 12/20/2012 Elsevier Interactive Patient Education  2017 [employer].

## 2018-01-28 NOTE — Telephone Encounter (Signed)
Olopatadine 0.2 ophth soln PA initiate via CoverMyMeds.  Key: [id number]

## 2018-01-28 NOTE — Progress Notes (Signed)
Name: [name]   MRN: [id number]    DOB: July 02, 1970   Date:01/28/2018       Progress Note  Subjective  Chief Complaint  URI  HPI  [name] is here today requesting evaluation of acute complaint of cold symptoms, which began about 1 week ago. He first developed dry cough, sore throat, which have seemed to be improving through the week. However, over the past few days hes started to notice pressure and pain to bilateral ears, watery red eyes and waking with his eyes matted shut. He denies fevers, chills, syncope, confusion, vision changes, body aches, chest pain, shortness of breath, abdominal pain, nausea, vomiting. He has tried mucinex and zyrtec at home for symptoms, mucinex seemed to help some. He has had ear infections in the past  Patient Active Problem List   Diagnosis Date Noted  . Acute prostatitis 11/17/2017  . Hepatic steatosis 11/03/2017  . Snoring 06/22/2017  . Primary narcolepsy without cataplexy 06/22/2017  . Periodic limb movements of sleep 06/22/2017  . Hypersomnia 06/22/2017  . Nonintractable epilepsy with complex partial seizures ([place]) 03/23/2017  . Drug-induced erectile dysfunction 11/06/2015  . Body mass index (BMI) of 35.0 to 35.9 with comorbidity   . Sarcoidosis ([place]) 09/29/2010  . GERD 01/07/2010  . Exercise-induced asthma 12/03/2009  . POLYNEUROPATHY 10/17/2009  . Male hypogonadism 10/08/2009  . Diabetes mellitus type 2 in obese ([place]) 10/07/2009  . Hyperlipidemia with target LDL less than 70 10/07/2009  . Gout 10/07/2009  . Essential hypertension 10/07/2009  . CAD (coronary artery disease) 10/07/2009  . Male erectile dysfunction 10/07/2009    Social History   Tobacco Use  . Smoking status: Former Smoker    Packs/day: 1.00    Years: 10.00    Pack years: 10.00    Types: Cigarettes    Last attempt to quit: 05/18/2014    Years since quitting: 3.7  . Smokeless tobacco: Never Used  Substance Use Topics  . Alcohol use: Yes    Alcohol/week:  0.0 standard drinks    Comment: 04/13/2016 "glass of wine a couple times/month"     Current Outpatient Medications:  .  albuterol (VENTOLIN HFA) 108 (90 Base) MCG/ACT inhaler, Inhale 2 puffs into the lungs every 6 (six) hours as needed for wheezing or shortness of breath., Disp: 3 Inhaler, Rfl: 1 .  allopurinol (ZYLOPRIM) 300 MG tablet, TAKE 1 TABLET(300 MG) BY MOUTH DAILY, Disp: 90 tablet, Rfl: 0 .  anastrozole (ARIMIDEX) 1 MG tablet, Take 1 mg by mouth daily., Disp: , Rfl:  .  aspirin EC 81 MG tablet, Take 1 tablet (81 mg total) by mouth daily., Disp: 30 tablet, Rfl: 3 .  Blood Glucose Monitoring Suppl (ONETOUCH VERIO FLEX SYSTEM) w/Device KIT, 1 Act by Does not apply route 3 (three) times daily with meals as needed., Disp: 2 kit, Rfl: 0 .  carvedilol (COREG) 3.125 MG tablet, TAKE 1 TABLET(3.125 MG) BY MOUTH TWICE DAILY WITH A MEAL, Disp: 180 tablet, Rfl: 0 .  cholecalciferol (VITAMIN D) 1000 units tablet, Take 1,000 Units by mouth daily., Disp: , Rfl:  .  colchicine 0.6 MG tablet, Take 0.6 mg by mouth every 4 (four) hours as needed (gout pain)., Disp: , Rfl:  .  empagliflozin (JARDIANCE) 25 MG TABS tablet, Take 25 mg by mouth daily., Disp: 90 tablet, Rfl: 3 .  folic acid (FOLVITE) 1 MG tablet, TK 1 T PO QD, Disp: , Rfl: 1 .  glucose blood (ONETOUCH VERIO) test strip, Use TID, Disp:  100 each, Rfl: 11 .  irbesartan-hydrochlorothiazide (AVALIDE) 300-12.5 MG tablet, Take 1 tablet by mouth daily., Disp: , Rfl:  .  levETIRAcetam (KEPPRA) 1000 MG tablet, Take 1,000 mg by mouth 2 (two) times daily., Disp: , Rfl:  .  metFORMIN (GLUCOPHAGE-XR) 500 MG 24 hr tablet, TAKE 1 TABLET(500 MG) BY MOUTH TWICE DAILY WITH A MEAL, Disp: 180 tablet, Rfl: 0 .  methotrexate (RHEUMATREX) 2.5 MG tablet, Take 10 mg by mouth once a week. Take 4 tablets ('10mg'$ ) once weekly on Mondays. Caution:Chemotherapy. Protect from light., Disp: , Rfl:  .  nitroGLYCERIN (NITROSTAT) 0.4 MG SL tablet, Place 1 tablet (0.4 mg total) under  the tongue every 5 (five) minutes as needed for chest pain., Disp: 25 tablet, Rfl: 3 .  omeprazole (PRILOSEC) 40 MG capsule, Take 1 capsule (40 mg total) by mouth daily., Disp: 30 capsule, Rfl: 11 .  pioglitazone (ACTOS) 30 MG tablet, Take 1 tablet (30 mg total) by mouth daily., Disp: 90 tablet, Rfl: 3 .  potassium chloride SA (K-DUR,KLOR-CON) 20 MEQ tablet, TAKE 1 TABLET(20 MEQ) BY MOUTH TWICE DAILY, Disp: 180 tablet, Rfl: 0 .  rosuvastatin (CRESTOR) 40 MG tablet, Take 1 tablet (40 mg total) by mouth daily., Disp: 90 tablet, Rfl: 1 .  saw palmetto 160 MG capsule, Take 160 mg by mouth daily., Disp: , Rfl:  .  sitaGLIPtin ([name]) 100 MG tablet, Take 100 mg by mouth daily., Disp: , Rfl:  .  sucralfate (CARAFATE) 1 g tablet, TAKE 1 TABLET(1 GRAM) BY MOUTH FOUR TIMES DAILY AT BEDTIME WITH MEALS, Disp: 360 tablet, Rfl: 1 .  torsemide (DEMADEX) 10 MG tablet, Take 2 tablets (20 mg total) by mouth daily., Disp: 180 tablet, Rfl: 2 .  vitamin B-12 (CYANOCOBALAMIN) 250 MCG tablet, Take 250 mcg by mouth daily., Disp: , Rfl:  .  amoxicillin-clavulanate (AUGMENTIN) 875-125 MG tablet, Take 1 tablet by mouth 2 (two) times daily., Disp: 10 tablet, Rfl: 0 .  Olopatadine HCl 0.2 % SOLN, Apply 1 drop to eye daily., Disp: 2.5 mL, Rfl: 0  Allergies  Allergen Reactions  . Amlodipine Other (See Comments)    Ankle swelling with '10mg'$ , ok with '5mg'$  dose  . Sulfamethoxazole Rash    fever  . Sulfonamide Derivatives Rash and Other (See Comments)    ROS  No other specific complaints in a complete review of systems (except as listed in HPI above).  Objective  Vitals:   01/28/18 0816  BP: 118/78  Pulse: 83  Temp: 98 F (36.7 C)  TempSrc: Oral  SpO2: 97%  Weight: 259 lb (117.5 kg)  Height: 6' (1.829 m)    Body mass index is 35.13 kg/m.  Nursing Note and Vital Signs reviewed.  Physical Exam  Constitutional: Patient appears well-developed and well-nourished. No distress.  HEENT: head atraumatic,  normocephalic, pupils equal and reactive to light, EOM's intact, conjunctivae mildly injected bilaterally without exudate, TM's opaque, bulging bilaterally, external ear canals inflamed and erythematous,  no maxillary or frontal sinus tenderness , neck supple without lymphadenopathy, oropharynx pink and moist without exudate Cardiovascular: Normal rate, regular rhythm, S1/S2 present.  Distal pulses intact Pulmonary/Chest: Effort normal and breath sounds clear. No respiratory distress or retractions. Neurological: He is alert and oriented to person, place, and time. No cranial nerve deficit. Coordination, balance, strength, speech and gait are normal.  Skin: Skin is warm and dry. No rash noted. No erythema.  Psychiatric: Patient has a normal mood and affect. behavior is normal. Judgment and thought content normal.  Assessment & Plan  Upper respiratory tract infection, unspecified type Overall feeling better, however he does appear to have bilateral ear infection on exam today so will treat with course of antibiotics  -Home management, Red flags and when to present for emergency care or RTC including fever >101.79F, chest pain, shortness of breath, new/worsening/un-resolving symptoms, reviewed with patient at time of visit. Follow up and care instructions discussed and provided in AVS.  Other non-recurrent acute nonsuppurative otitis media of both ears Course of augmentin sent-dosing and side effects discussed - amoxicillin-clavulanate (AUGMENTIN) 875-125 MG tablet; Take 1 tablet by mouth 2 (two) times daily.  Dispense: 10 tablet; Refill: 0  Acute conjunctivitis of both eyes, unspecified acute conjunctivitis type Likely allergic vs viral conjunctivitis, pataday course sent, dosing and side effects discussed - Olopatadine HCl 0.2 % SOLN; Apply 1 drop to eye daily.  Dispense: 2.5 mL; Refill: 0

## 2018-01-31 NOTE — Telephone Encounter (Signed)
PA has been denied for Olopatadine 0.2 opthal soln - nonFDA approved use

## 2018-01-31 NOTE — Telephone Encounter (Signed)
I did not prescribe this

## 2018-02-01 ENCOUNTER — Other Ambulatory Visit: Payer: Self-pay | Admitting: Internal Medicine

## 2018-02-01 ENCOUNTER — Other Ambulatory Visit: Payer: Self-pay | Admitting: Gastroenterology

## 2018-02-01 DIAGNOSIS — E1169 Type 2 diabetes mellitus with other specified complication: Secondary | ICD-10-CM

## 2018-02-01 DIAGNOSIS — E669 Obesity, unspecified: Secondary | ICD-10-CM

## 2018-02-01 DIAGNOSIS — I10 Essential (primary) hypertension: Secondary | ICD-10-CM

## 2018-02-01 NOTE — Telephone Encounter (Signed)
I called pt- he states he paid OOP for the drops. He has been using the drops and his sxs are improving.   He asked me about his metformin refill. I advised him per his med list- metformin was refilled today and receipt was confirmed by [facility].

## 2018-02-11 ENCOUNTER — Telehealth: Payer: Self-pay | Admitting: Internal Medicine

## 2018-02-11 NOTE — Telephone Encounter (Signed)
Copied from CRM [id number]. Topic: Quick Communication - See Telephone Encounter >> Feb 11, 2018  3:30 PM Oneal Grout wrote: CRM for notification. See Telephone encounter for: 02/11/18. Pharmacy is requesting to know if patient has ever been on a statin drug due to increase risk for stroke with Diabetes.

## 2018-02-14 ENCOUNTER — Other Ambulatory Visit: Payer: Self-pay | Admitting: Internal Medicine

## 2018-02-16 ENCOUNTER — Ambulatory Visit (INDEPENDENT_AMBULATORY_CARE_PROVIDER_SITE_OTHER): Payer: Medicare Other | Admitting: Endocrinology

## 2018-02-16 VITALS — BP 122/78 | HR 94 | Ht 72.0 in | Wt 259.4 lb

## 2018-02-16 DIAGNOSIS — E669 Obesity, unspecified: Secondary | ICD-10-CM | POA: Diagnosis not present

## 2018-02-16 DIAGNOSIS — E1169 Type 2 diabetes mellitus with other specified complication: Secondary | ICD-10-CM | POA: Diagnosis not present

## 2018-02-16 LAB — POCT GLYCOSYLATED HEMOGLOBIN (HGB A1C): Hemoglobin A1C: 6.5 % — AB (ref 4.0–5.6)

## 2018-02-16 MED ORDER — GLUCOSE BLOOD VI STRP: 1.0000 | ORAL_STRIP | Freq: Every day | 11 refills | Status: DC

## 2018-02-16 NOTE — Patient Instructions (Addendum)
Please continue the same medications check your blood sugar once a day.  vary the time of day when you check, between before the 3 meals, and at bedtime.  also check if you have symptoms of your blood sugar being too high or too low.  please keep a record of the readings and bring it to your next appointment here (or you can bring the meter itself).  You can write it on any piece of paper.  please call us sooner if your blood sugar goes below 70, or if you have a lot of readings over 200. Please come back for a follow-up appointment in 4-6 months.  

## 2018-02-16 NOTE — Progress Notes (Signed)
Subjective:    Patient ID: [name], male    DOB: [birth year], 47 y.o.   MRN: [id number]  HPI Pt returns for f/u of diabetes mellitus:  DM type: 2 Dx'ed: 2008 Complications: CAD Therapy: 4 oral meds.   DKA: never Severe hypoglycemia: never.  Pancreatitis: 2019 ([facility]) Pancreatic imaging: 2011 CT: mild fatty replacement in the head and uncinate process.   Interval history: he takes meds as rx'ed.  pt states he feels well in general.  No recent steroids.   Past Medical History:  Diagnosis Date  . Anginal pain (HCC)   . Arthritis    ? 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 Followed by Dr. Nickola Major  . Arthritis    "ankles" (04/13/2016)  . Asthma   . CORONARY ARTERY DISEASE    a. cath 05/2014: mild LAD disease, normal LCx and RCA. Risk factor modification recommended.  Marland Kitchen DIABETES MELLITUS, TYPE II   . Edema   . ERECTILE DYSFUNCTION, ORGANIC   . GERD   . Gout, unspecified   . Heart murmur   . HYPERLIPIDEMIA   . HYPERTENSION   . 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 Pericarditis    recurrent  . POLYNEUROPATHY   . PULMONARY SARCOIDOSIS    Mediastinal lymphadenopathy with biospy proven sarcodosis  . Seizures (HCC)    "none in 4-5 years; don't know what kind; not related to alcohol" (04/13/2016)  . TESTICULAR HYPOFUNCTION   . TIA (transient ischemic attack)    "I don't remember when" (04/13/2016)    Past Surgical History:  Procedure Laterality Date  . BRONCHOSCOPY  08/21/08  . CARDIAC CATHETERIZATION  06/25/2009   minimal disease  . CARDIAC CATHETERIZATION N/A 04/14/2016   Procedure: Left Heart Cath and Coronary Angiography;  Surgeon: Kathleene Hazel, MD;  Location: [facility] INVASIVE CV LAB;  Service: Cardiovascular;  Laterality: N/A;  . CHOLECYSTECTOMY N/A 09/21/2017   Procedure: LAPAROSCOPIC CHOLECYSTECTOMY WITH INTRAOPERATIVE CHOLANGIOGRAM;  Surgeon: Glenna Fellows, MD;  Location: [facility] ORS;  Service: General;  Laterality: N/A;   . LEFT HEART CATHETERIZATION WITH CORONARY ANGIOGRAM N/A 05/18/2014   Procedure: LEFT HEART CATHETERIZATION WITH CORONARY ANGIOGRAM;  Surgeon: Corky Crafts, MD;  Location: [facility] CATH LAB;  Service: Cardiovascular;  Laterality: N/A;  . MEDIASTINOSCOPY  11/30/08    Social History   Socioeconomic History  . Marital status: Significant Other    Spouse name: Not on file  . Number of children: 3  . Years of education: Not on file  . Highest education level: Not on file  Occupational History  . Occupation: Unemployed  Social Needs  . Financial resource strain: Not on file  . Food insecurity:    Worry: Not on file    Inability: Not on file  . Transportation needs:    Medical: Not on file    Non-medical: Not on file  Tobacco Use  . Smoking status: Former Smoker    Packs/day: 1.00    Years: 10.00    Pack years: 10.00    Types: Cigarettes    Last attempt to quit: 05/18/2014    Years since quitting: 3.7  . Smokeless tobacco: Never Used  Substance and Sexual Activity  . Alcohol use: Yes    Alcohol/week: 0.0 standard drinks    Comment: 04/13/2016 "glass of wine a couple times/month"  . Drug use: No  . Sexual activity: Yes  Lifestyle  . Physical activity:    Days per week: Not on file  Minutes per session: Not on file  . Stress: Not on file  Relationships  . Social connections:    Talks on phone: Not on file    Gets together: Not on file    Attends religious service: Not on file    Active member of club or organization: Not on file    Attends meetings of clubs or organizations: Not on file    Relationship status: Not on file  . Intimate partner violence:    Fear of current or ex partner: Not on file    Emotionally abused: Not on file    Physically abused: Not on file    Forced sexual activity: Not on file  Other Topics Concern  . Not on file  Social History Narrative   Married, lives with wife and 3 kids.    Currently student. 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 albuterol (VENTOLIN HFA) 108 (90 Base) MCG/ACT inhaler Inhale 2 puffs into the lungs every 6 (six) hours as needed for wheezing or shortness of breath. 3 Inhaler 1  . allopurinol (ZYLOPRIM) 300 MG tablet TAKE 1 TABLET(300 MG) BY MOUTH DAILY 90 tablet 0  . anastrozole (ARIMIDEX) 1 MG tablet Take 1 mg by mouth daily.    Marland Kitchen aspirin EC 81 MG tablet Take 1 tablet (81 mg total) by mouth daily. 30 tablet 3  . carvedilol (COREG) 3.125 MG tablet TAKE 1 TABLET(3.125 MG) BY MOUTH TWICE DAILY WITH A MEAL 180 tablet 0  . cholecalciferol (VITAMIN D) 1000 units tablet Take 1,000 Units by mouth daily.    . colchicine 0.6 MG tablet Take 0.6 mg by mouth every 4 (four) hours as needed (gout pain).    Marland Kitchen empagliflozin (JARDIANCE) 25 MG TABS tablet Take 25 mg by mouth daily. 90 tablet 3  . folic acid (FOLVITE) 1 MG tablet TK 1 T PO QD  1  . irbesartan-hydrochlorothiazide (AVALIDE) 300-12.5 MG tablet Take 1 tablet by mouth daily.    . irbesartan-hydrochlorothiazide (AVALIDE) 300-12.5 MG tablet TAKE 1 TABLET BY MOUTH EVERY DAY 90 tablet 0  . levETIRAcetam (KEPPRA) 1000 MG tablet Take 1,000 mg by mouth 2 (two) times daily.    . metFORMIN (GLUCOPHAGE-XR) 500 MG 24 hr tablet TAKE 1 TABLET(500 MG) BY MOUTH TWICE DAILY WITH A MEAL 180 tablet 0  . methotrexate (RHEUMATREX) 2.5 MG tablet Take 10 mg by mouth once a week. Take 4 tablets (10mg ) once weekly on Mondays. Caution:Chemotherapy. Protect from light.    . nitroGLYCERIN (NITROSTAT) 0.4 MG SL tablet Place 1 tablet (0.4 mg total) under the tongue every 5 (five) minutes as needed for chest pain. 25 tablet 3  . Olopatadine HCl 0.2 % SOLN Apply 1 drop to eye daily. 2.5 mL 0  . omeprazole (PRILOSEC) 40 MG capsule Take 1 capsule (40 mg total) by mouth daily. 30 capsule 11  . pioglitazone (ACTOS) 30 MG tablet Take 1 tablet (30 mg total) by mouth daily. 90 tablet 3  .  potassium chloride SA (K-DUR,KLOR-CON) 20 MEQ tablet TAKE 1 TABLET(20 MEQ) BY MOUTH TWICE DAILY 180 tablet 0  . rosuvastatin (CRESTOR) 40 MG tablet Take 1 tablet (40 mg total) by mouth daily. 90 tablet 1  . saw palmetto 160 MG capsule Take 160 mg by mouth daily.    . sitaGLIPtin ([name]) 100 MG tablet Take 100 mg by mouth daily.    Marland Kitchen  sucralfate (CARAFATE) 1 g tablet TAKE 1 TABLET(1 GRAM) BY MOUTH FOUR TIMES DAILY AT BEDTIME WITH MEALS 360 tablet 1  . sucralfate (CARAFATE) 1 g tablet TAKE 1 TABLET(1 GRAM) BY MOUTH FOUR TIMES DAILY AT BEDTIME WITH MEALS 120 tablet 0  . torsemide (DEMADEX) 10 MG tablet Take 2 tablets (20 mg total) by mouth daily. 180 tablet 2  . vitamin B-12 (CYANOCOBALAMIN) 250 MCG tablet Take 250 mcg by mouth daily.     No current facility-administered medications on file prior to visit.     Allergies  Allergen Reactions  . Amlodipine Other (See Comments)    Ankle swelling with 10mg , ok with 5mg  dose  . Sulfamethoxazole Rash    fever  . Sulfonamide Derivatives Rash and Other (See Comments)    Family History  Problem Relation Age of Onset  . Diabetes Mother   . Hypertension Mother   . Diabetes Father   . Heart disease Father 13       Fatal MI  . Heart failure Father     BP 122/78   Pulse 94   Ht 6' (1.829 m)   Wt 259 lb 6.4 oz (117.7 kg)   SpO2 98%   BMI 35.18 kg/m    Review of Systems He denies hypoglycemia    Objective:   Physical Exam VITAL SIGNS:  See vs page GENERAL: no distress Pulses: foot pulses are intact bilaterally.   MSK: no deformity of the feet or ankles.  CV: no edema of the legs or ankles Skin:  no ulcer on the feet or ankles.  normal color and temp on the feet and ankles Neuro: sensation is intact to touch on the feet and ankles.    A1c=6.5%  Lab Results  Component Value Date   CREATININE 0.88 11/03/2017   BUN 13 11/03/2017   NA 139 11/03/2017   K 3.7 11/03/2017   CL 100 11/03/2017   CO2 30 11/03/2017      Assessment &  Plan:  Type 2 DM, with CAD: well-controlled Pancreatis steatosis: his regimen should include pioglitazone.  Patient Instructions  Please continue the same medications. check your blood sugar once a day.  vary the time of day when you check, between before the 3 meals, and at bedtime.  also check if you have symptoms of your blood sugar being too high or too low.  please keep a record of the readings and bring it to your next appointment here (or you can bring the meter itself).  You can write it on any piece of paper.  please call 11/05/2017 sooner if your blood sugar goes below 70, or if you have a lot of readings over 200. Please come back for a follow-up appointment in 4-6 months.

## 2018-02-18 ENCOUNTER — Ambulatory Visit: Payer: Medicare Other

## 2018-03-01 ENCOUNTER — Ambulatory Visit: Payer: Medicare Other | Admitting: Podiatry

## 2018-03-01 ENCOUNTER — Other Ambulatory Visit: Payer: Self-pay | Admitting: Gastroenterology

## 2018-03-01 ENCOUNTER — Encounter: Payer: Self-pay | Admitting: Podiatry

## 2018-03-01 DIAGNOSIS — M6740 Ganglion, unspecified site: Secondary | ICD-10-CM

## 2018-03-02 NOTE — Progress Notes (Signed)
He presents today for follow-up of a mucoid cyst third digit left foot.  He states that it came back after 2 years.  Objective: Vital signs are stable he is alert and oriented x3.  Pulses are palpable.  Mucoid cyst distal aspect of the toe no erythema edema sialitis drainage or odor.  Assessment: Mucoid cyst third left.  Plan: After sterile Betadine skin prep I lanced the area today expressed the joint fluid substance repainted with Betadine and redressed with a dry sterile compressive dressing.

## 2018-03-28 ENCOUNTER — Other Ambulatory Visit: Payer: Self-pay | Admitting: Gastroenterology

## 2018-03-28 ENCOUNTER — Other Ambulatory Visit: Payer: Self-pay | Admitting: Endocrinology

## 2018-03-29 NOTE — Telephone Encounter (Signed)
This medication was discontinued by Dr. Yetta Barre. It appears the prescribing provider is "historical". Is this something the pt should be taking? If so, do you agree with how it is written?

## 2018-03-30 ENCOUNTER — Telehealth: Payer: Self-pay | Admitting: Endocrinology

## 2018-03-30 ENCOUNTER — Other Ambulatory Visit: Payer: Self-pay

## 2018-03-30 MED ORDER — SITAGLIPTIN PHOSPHATE 100 MG PO TABS: 100.0000 mg | ORAL_TABLET | Freq: Every day | ORAL | 3 refills | Status: DC

## 2018-03-30 NOTE — Telephone Encounter (Signed)
Medication was discontinued by Dr. Yetta Barre and it appears prescribing provider is listed as "historical". Message has been sent to Dr. Everardo All re: how to proceed with this refill request. Thanks

## 2018-03-30 NOTE — Telephone Encounter (Signed)
Patient called stating [facility] faxed over a refill request on 10/1 and they have not received anything from [place]. sitaGLIPtin ([name]) 100 MG tablet They are currently completely out. Please Advise. Ph # [phone] [facility] [phone]

## 2018-03-30 NOTE — Telephone Encounter (Signed)
Please refill prn 

## 2018-03-30 NOTE — Telephone Encounter (Signed)
Rx sent to pt pharmacy as requested 

## 2018-04-04 ENCOUNTER — Other Ambulatory Visit: Payer: Self-pay | Admitting: Internal Medicine

## 2018-04-30 ENCOUNTER — Other Ambulatory Visit: Payer: Self-pay | Admitting: Internal Medicine

## 2018-04-30 DIAGNOSIS — I10 Essential (primary) hypertension: Secondary | ICD-10-CM

## 2018-04-30 DIAGNOSIS — E669 Obesity, unspecified: Secondary | ICD-10-CM

## 2018-04-30 DIAGNOSIS — E1169 Type 2 diabetes mellitus with other specified complication: Secondary | ICD-10-CM

## 2018-05-17 ENCOUNTER — Ambulatory Visit: Payer: Self-pay

## 2018-05-24 ENCOUNTER — Other Ambulatory Visit (INDEPENDENT_AMBULATORY_CARE_PROVIDER_SITE_OTHER): Payer: Medicare Other

## 2018-05-24 ENCOUNTER — Ambulatory Visit: Payer: Medicare Other | Admitting: Physician Assistant

## 2018-05-24 ENCOUNTER — Encounter: Payer: Self-pay | Admitting: Physician Assistant

## 2018-05-24 VITALS — BP 120/84 | HR 84 | Ht 72.0 in | Wt 269.0 lb

## 2018-05-24 DIAGNOSIS — R110 Nausea: Secondary | ICD-10-CM

## 2018-05-24 DIAGNOSIS — R1084 Generalized abdominal pain: Secondary | ICD-10-CM

## 2018-05-24 DIAGNOSIS — R6881 Early satiety: Secondary | ICD-10-CM

## 2018-05-24 DIAGNOSIS — R1013 Epigastric pain: Secondary | ICD-10-CM | POA: Diagnosis not present

## 2018-05-24 LAB — CBC WITH DIFFERENTIAL/PLATELET
Basophils Absolute: 0.1 10*3/uL (ref 0.0–0.1)
Basophils Relative: 1.2 % (ref 0.0–3.0)
Eosinophils Absolute: 0.2 10*3/uL (ref 0.0–0.7)
Eosinophils Relative: 3.4 % (ref 0.0–5.0)
HCT: 45.8 % (ref 39.0–52.0)
Hemoglobin: 15.6 g/dL (ref 13.0–17.0)
Lymphocytes Relative: 48.1 % — ABNORMAL HIGH (ref 12.0–46.0)
Lymphs Abs: 2.7 10*3/uL (ref 0.7–4.0)
MCHC: 34.1 g/dL (ref 30.0–36.0)
MCV: 79.8 fl (ref 78.0–100.0)
Monocytes Absolute: 0.6 10*3/uL (ref 0.1–1.0)
Monocytes Relative: 10 % (ref 3.0–12.0)
Neutro Abs: 2.1 10*3/uL (ref 1.4–7.7)
Neutrophils Relative %: 37.3 % — ABNORMAL LOW (ref 43.0–77.0)
Platelets: 253 10*3/uL (ref 150.0–400.0)
RBC: 5.74 Mil/uL (ref 4.22–5.81)
RDW: 14.7 % (ref 11.5–15.5)
WBC: 5.7 10*3/uL (ref 4.0–10.5)

## 2018-05-24 LAB — HEPATIC FUNCTION PANEL
ALT: 20 U/L (ref 0–53)
AST: 14 U/L (ref 0–37)
Albumin: 4.3 g/dL (ref 3.5–5.2)
Alkaline Phosphatase: 101 U/L (ref 39–117)
Bilirubin, Direct: 0.1 mg/dL (ref 0.0–0.3)
Total Bilirubin: 0.4 mg/dL (ref 0.2–1.2)
Total Protein: 7.6 g/dL (ref 6.0–8.3)

## 2018-05-24 LAB — COMPREHENSIVE METABOLIC PANEL
ALT: 20 U/L (ref 0–53)
AST: 14 U/L (ref 0–37)
Albumin: 4.3 g/dL (ref 3.5–5.2)
Alkaline Phosphatase: 101 U/L (ref 39–117)
BUN: 14 mg/dL (ref 6–23)
CO2: 30 mEq/L (ref 19–32)
Calcium: 9.6 mg/dL (ref 8.4–10.5)
Chloride: 101 mEq/L (ref 96–112)
Creatinine, Ser: 0.97 mg/dL (ref 0.40–1.50)
GFR: 107.42 mL/min (ref >60.00)
Glucose, Bld: 145 mg/dL — ABNORMAL HIGH (ref 70–99)
Potassium: 3.8 mEq/L (ref 3.5–5.1)
Sodium: 140 mEq/L (ref 135–145)
Total Bilirubin: 0.4 mg/dL (ref 0.2–1.2)
Total Protein: 7.6 g/dL (ref 6.0–8.3)

## 2018-05-24 LAB — LIPASE: Lipase: 22 U/L (ref 11.0–59.0)

## 2018-05-24 MED ORDER — OMEPRAZOLE 40 MG PO CPDR: DELAYED_RELEASE_CAPSULE | ORAL | 2 refills | Status: DC

## 2018-05-24 NOTE — Progress Notes (Signed)
Chief Complaint: Abdominal pain and nausea   HPI:    Mr. [name] is a 47 year old African-American male with a past medical history as listed below, known to Dr. Russella Dar, who presents to clinic today with a complaint of abdominal pain and nausea.    11/03/2017 office visit with Dr. Russella Dar.  Referred for pancreatitis, also describes dysphagia.  At that time patient was scheduled for an EGD.  Continued on Omeprazole 40 mg p.o. every morning and Carafate suspension 3 times daily with antireflux measures.    11/09/2017 EGD with no endoscopic esophageal abnormality to explain dysphasia though he was empirically dilated, erythematous mucosa in the stomach which was biopsied and normal duodenum and second portion of duodenum.  Pathology showed mild chronic inflammation with focal erosion.  Negative for H. Pylori.    Today, patient presents clinic and explains that for the past 3 weeks he has been getting very full after eating, sometimes even during his meal.  Also describes that typically during or right after eating he will develop a lower abdominal pain which will radiate up through his navel and into his stomach.  This can last for days or hours and tends to come and go rated as a 6/10 and described as sharp/cramping.  Tells me that if he skips breakfast and lunch 1 day he will be okay, but whenever he eats this seems to come on.  Currently using his Omeprazole 40 mg daily and Carafate 1 g twice a day.  Does admit to some increased alcohol intake recently over the holidays.  Associated symptoms include nausea.    Also describes a decrease in bowel movements noting that typically he used to have a bowel movement once every day and now it can be just once a week.    Denies fever, chills, weight loss, anorexia, vomiting or symptoms that awaken him from sleep.  RUQ [place] 09/17/2017 IMPRESSION: 1. Cholelithiasis without evidence of acute cholecystitis. 2. No bile duct dilatation. 3. Fatty infiltration of the liver   Abd/pelvic CT 09/2017 IMPRESSION: 1. Mild fullness of the pancreatic head with associated slight haziness of the peripancreatic fat, cannot exclude a mild uncomplicated acute pancreatitis. No discrete pancreatic mass. No pancreatic or biliary ductal dilatation. No peripancreatic fluid collections. 2. No evidence of bowel obstruction or acute bowel inflammation. 3. Diffuse hepatic steatosis. 4. Mildly enlarged prostate  Past Medical History:  Diagnosis Date  . Anginal pain (HCC)   . Arthritis    ? 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 Followed by Dr. Nickola Major  . Arthritis    "ankles" (04/13/2016)  . Asthma   . CORONARY ARTERY DISEASE    a. cath 05/2014: mild LAD disease, normal LCx and RCA. Risk factor modification recommended.  Marland Kitchen DIABETES MELLITUS, TYPE II   . Edema   . ERECTILE DYSFUNCTION, ORGANIC   . GERD   . Gout, unspecified   . Heart murmur   . HYPERLIPIDEMIA   . HYPERTENSION   . 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 Pericarditis    recurrent  . POLYNEUROPATHY   . PULMONARY SARCOIDOSIS    Mediastinal lymphadenopathy with biospy proven sarcodosis  . Seizures (HCC)    "none in 4-5 years; don't know what kind; not related to alcohol" (04/13/2016)  . TESTICULAR HYPOFUNCTION   . TIA (transient ischemic attack)    "I don't remember when" (04/13/2016)    Past Surgical History:  Procedure Laterality Date  . BRONCHOSCOPY  08/21/08  .  CARDIAC CATHETERIZATION  06/25/2009   minimal disease  . CARDIAC CATHETERIZATION N/A 04/14/2016   Procedure: Left Heart Cath and Coronary Angiography;  Surgeon: Kathleene Hazel, MD;  Location: [facility] INVASIVE CV LAB;  Service: Cardiovascular;  Laterality: N/A;  . CHOLECYSTECTOMY N/A 09/21/2017   Procedure: LAPAROSCOPIC CHOLECYSTECTOMY WITH INTRAOPERATIVE CHOLANGIOGRAM;  Surgeon: Glenna Fellows, MD;  Location: [facility] ORS;  Service: General;  Laterality: N/A;  . LEFT HEART CATHETERIZATION WITH CORONARY  ANGIOGRAM N/A 05/18/2014   Procedure: LEFT HEART CATHETERIZATION WITH CORONARY ANGIOGRAM;  Surgeon: Corky Crafts, MD;  Location: [facility] CATH LAB;  Service: Cardiovascular;  Laterality: N/A;  . MEDIASTINOSCOPY  11/30/08    Current Outpatient Medications  Medication Sig Dispense Refill  . albuterol (VENTOLIN HFA) 108 (90 Base) MCG/ACT inhaler Inhale 2 puffs into the lungs every 6 (six) hours as needed for wheezing or shortness of breath. 3 Inhaler 1  . allopurinol (ZYLOPRIM) 300 MG tablet TAKE 1 TABLET(300 MG) BY MOUTH DAILY 90 tablet 0  . anastrozole (ARIMIDEX) 1 MG tablet Take 1 mg by mouth daily.    Marland Kitchen aspirin EC 81 MG tablet Take 1 tablet (81 mg total) by mouth daily. 30 tablet 3  . carvedilol (COREG) 3.125 MG tablet TAKE 1 TABLET(3.125 MG) BY MOUTH TWICE DAILY WITH A MEAL 180 tablet 0  . cholecalciferol (VITAMIN D) 1000 units tablet Take 1,000 Units by mouth daily.    . colchicine 0.6 MG tablet Take 0.6 mg by mouth every 4 (four) hours as needed (gout pain).    Marland Kitchen empagliflozin (JARDIANCE) 25 MG TABS tablet Take 25 mg by mouth daily. 90 tablet 3  . folic acid (FOLVITE) 1 MG tablet TK 1 T PO QD  1  . glucose blood (ONETOUCH VERIO) test strip 1 each by Other route daily. And lancets 1/day 100 each 11  . irbesartan-hydrochlorothiazide (AVALIDE) 300-12.5 MG tablet Take 1 tablet by mouth daily.    . irbesartan-hydrochlorothiazide (AVALIDE) 300-12.5 MG tablet TAKE 1 TABLET BY MOUTH EVERY DAY 90 tablet 0  . levETIRAcetam (KEPPRA) 1000 MG tablet Take 1,000 mg by mouth 2 (two) times daily.    . metFORMIN (GLUCOPHAGE-XR) 500 MG 24 hr tablet TAKE 1 TABLET(500 MG) BY MOUTH TWICE DAILY WITH A MEAL 180 tablet 0  . methotrexate (RHEUMATREX) 2.5 MG tablet Take 10 mg by mouth once a week. Take 4 tablets (10mg ) once weekly on Mondays. Caution:Chemotherapy. Protect from light.    . nitroGLYCERIN (NITROSTAT) 0.4 MG SL tablet Place 1 tablet (0.4 mg total) under the tongue every 5 (five) minutes as needed for  chest pain. 25 tablet 3  . Olopatadine HCl 0.2 % SOLN Apply 1 drop to eye daily. 2.5 mL 0  . omeprazole (PRILOSEC) 40 MG capsule Take 1 capsule (40 mg total) by mouth daily. 30 capsule 11  . pioglitazone (ACTOS) 30 MG tablet Take 1 tablet (30 mg total) by mouth daily. 90 tablet 3  . potassium chloride SA (K-DUR,KLOR-CON) 20 MEQ tablet TAKE 1 TABLET(20 MEQ) BY MOUTH TWICE DAILY 180 tablet 0  . rosuvastatin (CRESTOR) 40 MG tablet Take 1 tablet (40 mg total) by mouth daily. 90 tablet 1  . saw palmetto 160 MG capsule Take 160 mg by mouth daily.    . sitaGLIPtin ([name]) 100 MG tablet Take 1 tablet (100 mg total) by mouth daily. 30 tablet 3  . sucralfate (CARAFATE) 1 g tablet TAKE 1 TABLET(1 GRAM) BY MOUTH FOUR TIMES DAILY AT BEDTIME WITH MEALS 360 tablet 1  . sucralfate (CARAFATE)  1 g tablet TAKE 1 TABLET(1 GRAM) BY MOUTH FOUR TIMES DAILY AT BEDTIME WITH MEALS 120 tablet 5  . torsemide (DEMADEX) 10 MG tablet Take 2 tablets (20 mg total) by mouth daily. 180 tablet 2  . vitamin B-12 (CYANOCOBALAMIN) 250 MCG tablet Take 250 mcg by mouth daily.     No current facility-administered medications for this visit.     Allergies as of 05/24/2018 - Review Complete 03/01/2018  Allergen Reaction Noted  . Amlodipine Other (See Comments) 06/09/2016  . Sulfamethoxazole Rash 03/23/2017  . Sulfonamide derivatives Rash and Other (See Comments)     Family History  Problem Relation Age of Onset  . Diabetes Mother   . Hypertension Mother   . Diabetes Father   . Heart disease Father 70       Fatal MI  . Heart failure Father     Social History   Socioeconomic History  . Marital status: Significant Other    Spouse name: Not on file  . Number of children: 3  . Years of education: Not on file  . Highest education level: Not on file  Occupational History  . Occupation: Unemployed  Social Needs  . Financial resource strain: Not on file  . Food insecurity:    Worry: Not on file    Inability: Not on file   . Transportation needs:    Medical: Not on file    Non-medical: Not on file  Tobacco Use  . Smoking status: Former Smoker    Packs/day: 1.00    Years: 10.00    Pack years: 10.00    Types: Cigarettes    Last attempt to quit: 05/18/2014    Years since quitting: 4.0  . Smokeless tobacco: Never Used  Substance and Sexual Activity  . Alcohol use: Yes    Alcohol/week: 0.0 standard drinks    Comment: 04/13/2016 "glass of wine a couple times/month"  . Drug use: No  . Sexual activity: Yes  Lifestyle  . Physical activity:    Days per week: Not on file    Minutes per session: Not on file  . Stress: Not on file  Relationships  . Social connections:    Talks on phone: Not on file    Gets together: Not on file    Attends religious service: Not on file    Active member of club or organization: Not on file    Attends meetings of clubs or organizations: Not on file    Relationship status: Not on file  . Intimate partner violence:    Fear of current or ex partner: Not on file    Emotionally abused: Not on file    Physically abused: Not on file    Forced sexual activity: Not on file  Other Topics Concern  . Not on file  Social History Narrative   Married, lives with wife and 3 kids.    Currently student. 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 Lungs clear to auscultation bilaterally.  No wheezes, crackles, or rhonchi.  Cardiovascular: Normal S1, S2. No MRG. Regular rate and rhythm. No peripheral edema, cyanosis or pallor.    Gastrointestinal:  Soft, nondistended, mild epigastric ttp. No rebound or guarding. Normal bowel sounds. No appreciable masses or hepatomegaly. Rectal:  Not performed.  Psychiatric: Demonstrates good judgement and reason without abnormal affect or behaviors.  No recent labs or imaging.  Assessment: 1.  Abdominal pain: Described as generalized, seeming to radiate up into his epigastrium eventually over the past 3 weeks; considered worsened gastritis with early satiety vs other 2.  Nausea and early satiety: Over the past 3 weeks this is worse, history of chronic gastritis via EGD earlier this year, also history of pancreatitis and diabetes; consider relation to gastritis versus pancreatitis versus gastroparesis  Plan: 1.  Increased Omeprazole to 40 mg twice daily, 30-60 minutes before breakfast and dinner #60 with 3 refills 2.  Ordered CBC, CMP and lipase. 3.  Continue Carafate 1 g twice daily 4.  Would recommend the patient start over-the-counter MiraLAX once daily to maintain regular bowel movements. 5.  If labs are normal and patient continues with symptoms could consider a gastric emptying study given history of diabetes. 6.  Patient to follow in clinic with me or Dr. Russella Dar in 3-4 weeks.  Hyacinth Meeker, PA-C [facility] 05/24/2018, 11:02 AM  Cc: [name], MD

## 2018-05-24 NOTE — Progress Notes (Signed)
Reviewed and agree with initial management plan.  Sylver Vantassell T. Thomas Mabry, MD FACG 

## 2018-05-24 NOTE — Patient Instructions (Addendum)
Your provider has requested that you go to the basement level for lab work before leaving today. Press "B" on the elevator. The lab is located at the first door on the left as you exit the elevator. We sent a new prescription to [facility] [address], [address].  1. Omeprazole 40 mg.   We made you an office appointment with Dr. Russella Dar on 06-27-2018 at 10:45 am.   Normal BMI (Body Mass Index- based on height and weight) is between 19 and 25. Your BMI today is Body mass index is 36.48 kg/m. Marland Kitchen Please consider follow up  regarding your BMI with your Primary Care Provider.

## 2018-06-03 ENCOUNTER — Other Ambulatory Visit: Payer: Self-pay | Admitting: Internal Medicine

## 2018-06-03 DIAGNOSIS — I10 Essential (primary) hypertension: Secondary | ICD-10-CM

## 2018-06-03 DIAGNOSIS — E669 Obesity, unspecified: Secondary | ICD-10-CM

## 2018-06-03 DIAGNOSIS — E1169 Type 2 diabetes mellitus with other specified complication: Secondary | ICD-10-CM

## 2018-06-14 ENCOUNTER — Other Ambulatory Visit (INDEPENDENT_AMBULATORY_CARE_PROVIDER_SITE_OTHER): Payer: Medicare Other

## 2018-06-14 ENCOUNTER — Ambulatory Visit (INDEPENDENT_AMBULATORY_CARE_PROVIDER_SITE_OTHER): Payer: Medicare Other | Admitting: Internal Medicine

## 2018-06-14 ENCOUNTER — Encounter: Payer: Self-pay | Admitting: Internal Medicine

## 2018-06-14 VITALS — BP 124/70 | HR 89 | Temp 98.5°F | Ht 72.0 in | Wt 271.0 lb

## 2018-06-14 DIAGNOSIS — Z23 Encounter for immunization: Secondary | ICD-10-CM | POA: Diagnosis not present

## 2018-06-14 DIAGNOSIS — I10 Essential (primary) hypertension: Secondary | ICD-10-CM

## 2018-06-14 DIAGNOSIS — Z0000 Encounter for general adult medical examination without abnormal findings: Secondary | ICD-10-CM | POA: Insufficient documentation

## 2018-06-14 DIAGNOSIS — E785 Hyperlipidemia, unspecified: Secondary | ICD-10-CM

## 2018-06-14 DIAGNOSIS — M1000 Idiopathic gout, unspecified site: Secondary | ICD-10-CM

## 2018-06-14 DIAGNOSIS — Z1211 Encounter for screening for malignant neoplasm of colon: Secondary | ICD-10-CM

## 2018-06-14 DIAGNOSIS — E119 Type 2 diabetes mellitus without complications: Secondary | ICD-10-CM

## 2018-06-14 DIAGNOSIS — E669 Obesity, unspecified: Secondary | ICD-10-CM

## 2018-06-14 DIAGNOSIS — K760 Fatty (change of) liver, not elsewhere classified: Secondary | ICD-10-CM | POA: Diagnosis not present

## 2018-06-14 DIAGNOSIS — E1169 Type 2 diabetes mellitus with other specified complication: Secondary | ICD-10-CM

## 2018-06-14 DIAGNOSIS — I2510 Atherosclerotic heart disease of native coronary artery without angina pectoris: Secondary | ICD-10-CM

## 2018-06-14 LAB — URINALYSIS, ROUTINE W REFLEX MICROSCOPIC
Bilirubin Urine: NEGATIVE
Hgb urine dipstick: NEGATIVE
Ketones, ur: NEGATIVE
Leukocytes, UA: NEGATIVE
Nitrite: NEGATIVE
RBC / HPF: NONE SEEN (ref 0–?)
Specific Gravity, Urine: 1.01 (ref 1.000–1.030)
Total Protein, Urine: NEGATIVE
Urine Glucose: >=1000 — AB
Urobilinogen, UA: 0.2 (ref 0.0–1.0)
WBC, UA: NONE SEEN (ref 0–?)
pH: 5 (ref 5.0–8.0)

## 2018-06-14 LAB — TSH: TSH: 0.62 u[IU]/mL (ref 0.35–4.50)

## 2018-06-14 LAB — COMPREHENSIVE METABOLIC PANEL
ALT: 19 U/L (ref 0–53)
AST: 17 U/L (ref 0–37)
Albumin: 4.5 g/dL (ref 3.5–5.2)
Alkaline Phosphatase: 88 U/L (ref 39–117)
BUN: 14 mg/dL (ref 6–23)
CO2: 32 mEq/L (ref 19–32)
Calcium: 10 mg/dL (ref 8.4–10.5)
Chloride: 100 mEq/L (ref 96–112)
Creatinine, Ser: 1.06 mg/dL (ref 0.40–1.50)
GFR: 96.94 mL/min (ref >60.00)
Glucose, Bld: 106 mg/dL — ABNORMAL HIGH (ref 70–99)
Potassium: 4 mEq/L (ref 3.5–5.1)
Sodium: 142 mEq/L (ref 135–145)
Total Bilirubin: 0.4 mg/dL (ref 0.2–1.2)
Total Protein: 7.7 g/dL (ref 6.0–8.3)

## 2018-06-14 LAB — CBC WITH DIFFERENTIAL/PLATELET
Basophils Absolute: 0.1 10*3/uL (ref 0.0–0.1)
Basophils Relative: 0.9 % (ref 0.0–3.0)
Eosinophils Absolute: 0.2 10*3/uL (ref 0.0–0.7)
Eosinophils Relative: 2.8 % (ref 0.0–5.0)
HCT: 47.2 % (ref 39.0–52.0)
Hemoglobin: 16 g/dL (ref 13.0–17.0)
Lymphocytes Relative: 50.1 % — ABNORMAL HIGH (ref 12.0–46.0)
Lymphs Abs: 3 10*3/uL (ref 0.7–4.0)
MCHC: 33.9 g/dL (ref 30.0–36.0)
MCV: 79.4 fl (ref 78.0–100.0)
Monocytes Absolute: 0.7 10*3/uL (ref 0.1–1.0)
Monocytes Relative: 11.6 % (ref 3.0–12.0)
Neutro Abs: 2 10*3/uL (ref 1.4–7.7)
Neutrophils Relative %: 34.6 % — ABNORMAL LOW (ref 43.0–77.0)
Platelets: 281 10*3/uL (ref 150.0–400.0)
RBC: 5.95 Mil/uL — ABNORMAL HIGH (ref 4.22–5.81)
RDW: 14.4 % (ref 11.5–15.5)
WBC: 5.9 10*3/uL (ref 4.0–10.5)

## 2018-06-14 LAB — LIPID PANEL
Cholesterol: 166 mg/dL (ref 0–200)
HDL: 46.1 mg/dL (ref >39.00)
LDL Cholesterol: 88 mg/dL (ref 0–99)
NonHDL: 119.8
Total CHOL/HDL Ratio: 4
Triglycerides: 159 mg/dL — ABNORMAL HIGH (ref 0.0–149.0)
VLDL: 31.8 mg/dL (ref 0.0–40.0)

## 2018-06-14 LAB — MICROALBUMIN / CREATININE URINE RATIO
Creatinine,U: 32.4 mg/dL
Microalb Creat Ratio: 2.2 mg/g (ref 0.0–30.0)
Microalb, Ur: <0.7 mg/dL (ref 0.0–1.9)

## 2018-06-14 LAB — POCT GLYCOSYLATED HEMOGLOBIN (HGB A1C): Hemoglobin A1C: 6.5 % — AB (ref 4.0–5.6)

## 2018-06-14 LAB — URIC ACID: Uric Acid, Serum: 3.6 mg/dL — ABNORMAL LOW (ref 4.0–7.8)

## 2018-06-14 LAB — PSA: PSA: 1.57 ng/mL (ref 0.10–4.00)

## 2018-06-14 NOTE — Progress Notes (Signed)
Subjective:  Patient ID: [name], male    DOB: 08/12/1970  Age: 48 y.o. MRN: [id number]  CC: Diabetes; Hyperlipidemia; and Annual Exam   HPI [name] presents for a CPX.  He feels well and offers no complaints.  He is very active and exercises and denies any recent episodes of CP/DOE/palpitations/edema/fatigue.  He thinks he is due for follow-up with cardiology.  Outpatient Medications Prior to Visit  Medication Sig Dispense Refill  . albuterol (VENTOLIN HFA) 108 (90 Base) MCG/ACT inhaler Inhale 2 puffs into the lungs every 6 (six) hours as needed for wheezing or shortness of breath. 3 Inhaler 1  . allopurinol (ZYLOPRIM) 300 MG tablet TAKE 1 TABLET(300 MG) BY MOUTH DAILY 90 tablet 0  . anastrozole (ARIMIDEX) 1 MG tablet Take 1 mg by mouth daily.    Marland Kitchen aspirin EC 81 MG tablet Take 1 tablet (81 mg total) by mouth daily. 30 tablet 3  . carvedilol (COREG) 3.125 MG tablet TAKE 1 TABLET(3.125 MG) BY MOUTH TWICE DAILY WITH A MEAL 180 tablet 0  . cholecalciferol (VITAMIN D) 1000 units tablet Take 1,000 Units by mouth daily.    . colchicine 0.6 MG tablet Take 0.6 mg by mouth every 4 (four) hours as needed (gout pain).    Marland Kitchen empagliflozin (JARDIANCE) 25 MG TABS tablet Take 25 mg by mouth daily. 90 tablet 3  . folic acid (FOLVITE) 1 MG tablet TK 1 T PO QD  1  . glucose blood (ONETOUCH VERIO) test strip 1 each by Other route daily. And lancets 1/day 100 each 11  . irbesartan-hydrochlorothiazide (AVALIDE) 300-12.5 MG tablet TAKE 1 TABLET BY MOUTH EVERY DAY 90 tablet 0  . levETIRAcetam (KEPPRA) 1000 MG tablet Take 1,000 mg by mouth 2 (two) times daily.    . metFORMIN (GLUCOPHAGE-XR) 500 MG 24 hr tablet TAKE 1 TABLET(500 MG) BY MOUTH TWICE DAILY WITH A MEAL 180 tablet 0  . nitroGLYCERIN (NITROSTAT) 0.4 MG SL tablet Place 1 tablet (0.4 mg total) under the tongue every 5 (five) minutes as needed for chest pain. 25 tablet 3  . Olopatadine HCl 0.2 % SOLN Apply 1 drop to eye daily. 2.5 mL 0    . omeprazole (PRILOSEC) 40 MG capsule Take 1 tablet by mouth twice daily. 60 capsule 2  . pioglitazone (ACTOS) 30 MG tablet Take 1 tablet (30 mg total) by mouth daily. 90 tablet 3  . potassium chloride SA (K-DUR,KLOR-CON) 20 MEQ tablet TAKE 1 TABLET(20 MEQ) BY MOUTH TWICE DAILY 180 tablet 0  . rosuvastatin (CRESTOR) 40 MG tablet Take 1 tablet (40 mg total) by mouth daily. 90 tablet 1  . sitaGLIPtin ([name]) 100 MG tablet Take 1 tablet (100 mg total) by mouth daily. 30 tablet 3  . sucralfate (CARAFATE) 1 g tablet TAKE 1 TABLET(1 GRAM) BY MOUTH FOUR TIMES DAILY AT BEDTIME WITH MEALS 120 tablet 5  . torsemide (DEMADEX) 10 MG tablet Take 2 tablets (20 mg total) by mouth daily. 180 tablet 2  . vitamin B-12 (CYANOCOBALAMIN) 250 MCG tablet Take 250 mcg by mouth daily.    . methotrexate (RHEUMATREX) 2.5 MG tablet Take 10 mg by mouth once a week. Take 4 tablets (10mg ) once weekly on Mondays. Caution:Chemotherapy. Protect from light.    . saw palmetto 160 MG capsule Take 160 mg by mouth daily.    Marland Kitchen omeprazole (PRILOSEC) 40 MG capsule Take 1 capsule (40 mg total) by mouth daily. 30 capsule 11   No facility-administered medications prior to visit.  ROS Review of Systems  Constitutional: Negative for appetite change, diaphoresis, fatigue and unexpected weight change.  HENT: Negative.   Eyes: Negative for visual disturbance.  Respiratory: Negative for cough, chest tightness, shortness of breath and wheezing.   Cardiovascular: Negative for chest pain, palpitations and leg swelling.  Gastrointestinal: Negative for abdominal pain, constipation, diarrhea, nausea and vomiting.  Endocrine: Negative.  Negative for polydipsia, polyphagia and polyuria.  Genitourinary: Negative.  Negative for difficulty urinating, penile swelling, scrotal swelling and testicular pain.  Musculoskeletal: Negative.  Negative for arthralgias and myalgias.  Skin: Negative.  Negative for color change and pallor.  Neurological:  Negative.  Negative for dizziness, weakness, light-headedness, numbness and headaches.  Hematological: Negative for adenopathy. Does not bruise/bleed easily.  Psychiatric/Behavioral: Negative.     Objective:  BP 124/70 (BP Location: Left Arm, Patient Position: Sitting, Cuff Size: Large)   Pulse 89   Temp 98.5 F (36.9 C) (Oral)   Ht 6' (1.829 m)   Wt 271 lb (122.9 kg)   SpO2 97%   BMI 36.75 kg/m   BP Readings from Last 3 Encounters:  06/14/18 124/70  05/24/18 120/84  02/16/18 122/78    Wt Readings from Last 3 Encounters:  06/14/18 271 lb (122.9 kg)  05/24/18 269 lb (122 kg)  02/16/18 259 lb 6.4 oz (117.7 kg)    Physical Exam Vitals signs reviewed.  Constitutional:      Appearance: Normal appearance. He is obese.  HENT:     Nose: Nose normal. No congestion or rhinorrhea.     Mouth/Throat:     Pharynx: Oropharynx is clear. No oropharyngeal exudate.  Eyes:     General: No scleral icterus.    Conjunctiva/sclera: Conjunctivae normal.  Neck:     Musculoskeletal: Normal range of motion and neck supple.  Cardiovascular:     Rate and Rhythm: Normal rate and regular rhythm.     Heart sounds: No murmur. No gallop.   Pulmonary:     Effort: Pulmonary effort is normal.     Breath sounds: No stridor. No wheezing, rhonchi or rales.  Abdominal:     General: Bowel sounds are normal.     Palpations: There is no hepatomegaly, splenomegaly or mass.     Tenderness: There is no abdominal tenderness.     Hernia: There is no hernia in the right inguinal area or left inguinal area.  Genitourinary:    Pubic Area: No rash.      Penis: Normal and circumcised. No discharge, swelling or lesions.      Scrotum/Testes: Normal.        Right: Mass, tenderness or swelling not present.        Left: Mass, tenderness or swelling not present.     Epididymis:     Right: Normal.     Left: Normal.     Prostate: Normal. Not enlarged, not tender and no nodules present.     Rectum: Normal. Guaiac  result negative. No mass, tenderness, anal fissure, external hemorrhoid or internal hemorrhoid. Normal anal tone.  Musculoskeletal: Normal range of motion.        General: No swelling.     Right lower leg: No edema.     Left lower leg: No edema.  Lymphadenopathy:     Lower Body: No right inguinal adenopathy. No left inguinal adenopathy.  Skin:    General: Skin is warm and dry.     Findings: No erythema.  Neurological:     General: No focal deficit present.  Mental Status: He is oriented to person, place, and time. Mental status is at baseline.     Lab Results  Component Value Date   WBC 5.9 06/14/2018   HGB 16.0 06/14/2018   HCT 47.2 06/14/2018   PLT 281.0 06/14/2018   GLUCOSE 106 (H) 06/14/2018   CHOL 166 06/14/2018   TRIG 159.0 (H) 06/14/2018   HDL 46.10 06/14/2018   LDLCALC 88 06/14/2018   ALT 19 06/14/2018   AST 17 06/14/2018   NA 142 06/14/2018   K 4.0 06/14/2018   CL 100 06/14/2018   CREATININE 1.06 06/14/2018   BUN 14 06/14/2018   CO2 32 06/14/2018   TSH 0.62 06/14/2018   PSA 1.57 06/14/2018   INR 0.89 05/24/2016   HGBA1C 6.5 (A) 06/14/2018   MICROALBUR <0.7 06/14/2018    Nm Hepato W/eject Fract  Result Date: 09/20/2017 CLINICAL DATA:  Right upper quadrant pain with nausea and vomiting EXAM: NUCLEAR MEDICINE HEPATOBILIARY IMAGING TECHNIQUE: Sequential images of the abdomen were obtained out to 60 minutes following intravenous administration of radiopharmaceutical. RADIOPHARMACEUTICALS:  5.3 mCi Tc-1m  Choletec IV COMPARISON:  Ultrasound from 09/19/2017 FINDINGS: There is prompt uptake of biliary tracer throughout the liver. Visualization of the biliary tree with opacification of the small bowel is noted. No significant gallbladder opacification is noted. 5.3 mg of morphine was then administered intravenously and repeat imaging was obtained. On significant delayed images following morphine administration there was filling within the gallbladder. IMPRESSION:  Patent cystic duct although the gallbladder fills in a somewhat delayed fashion likely related consistent with abnormal gallbladder function. This may represent some chronic cholecystitis. No biliary obstruction is seen. Electronically Signed   By: Alcide Clever M.D.   On: 09/20/2017 15:39   Dg Chest Port 1 View  Result Date: 09/19/2017 CLINICAL DATA:  Shortness of breath. Heart murmur. Coronary artery disease. EXAM: PORTABLE CHEST 1 VIEW COMPARISON:  09/15/2017 FINDINGS: The heart size and mediastinal contours are within normal limits. Both lungs are clear. The visualized skeletal structures are unremarkable. IMPRESSION: No active disease. Electronically Signed   By: Myles Rosenthal M.D.   On: 09/19/2017 11:05   US Abdomen Limited Ruq  Result Date: 09/19/2017 CLINICAL DATA:  Abdominal pain since Wednesday, pancreatitis. EXAM: ULTRASOUND ABDOMEN LIMITED RIGHT UPPER QUADRANT COMPARISON:  None. FINDINGS: Gallbladder: Small layering gallstones. No gallbladder wall thickening, pericholecystic fluid, sonographic Murphy's sign or other secondary signs of acute cholecystitis. Common bile duct: Diameter: 4 mm Liver: No focal lesion identified. Diffusely echogenic parenchyma indicating fatty infiltration. Portal vein is patent on color Doppler imaging with normal direction of blood flow towards the liver. IMPRESSION: 1. Cholelithiasis without evidence of acute cholecystitis. 2. No bile duct dilatation. 3. Fatty infiltration of the liver. Electronically Signed   By: Bary Richard M.D.   On: 09/19/2017 14:16    Assessment & Plan:   Tayshawn was seen today for diabetes, hyperlipidemia and annual exam.  Diagnoses and all orders for this visit:  Essential hypertension- His blood pressure is well controlled.  Electrolytes and renal function are normal. -     CBC with Differential/Platelet; Future -     Urinalysis, Routine w reflex microscopic; Future  Hepatic steatosis- His LFTs are normal.  He was encouraged to  improve his lifestyle modifications. -     Comprehensive metabolic panel; Future  Diabetes mellitus type 2 in obese (HCC)- His A1c is at 6.5%.  His blood sugars are adequately well controlled.  Will continue the combination of metformin and a  DPP 4 inhibitor. -     CBC with Differential/Platelet; Future -     Comprehensive metabolic panel; Future -     Microalbumin / creatinine urine ratio; Future -     POCT glycosylated hemoglobin (Hb A1C)  Hyperlipidemia with target LDL less than 70- He has achieved his LDL goal and is doing well on the statin. -     TSH; Future  Routine general medical examination at a health care facility- Exam completed, labs reviewed, vaccines reviewed and updated, Cologuard ordered to screen for colon polyps/cancer, patient education material was given. -     Lipid panel; Future -     PSA; Future  Need for influenza vaccination -     Flu Vaccine QUAD 36+ mos IM  Coronary artery disease involving native coronary artery of native heart without angina pectoris -     Ambulatory referral to Cardiology  Idiopathic gout, unspecified chronicity, unspecified site- His uric acid level is low and he has had no recent flares of gout.  Will continue the current dose of allopurinol. -     Uric acid; Future  Colon cancer screening -     Cologuard   I have discontinued Markian T. Rosengrant's saw palmetto and methotrexate. I am also having him maintain his aspirin EC, cholecalciferol, folic acid, rosuvastatin, allopurinol, nitroGLYCERIN, albuterol, vitamin B-12, levETIRAcetam, colchicine, torsemide, anastrozole, pioglitazone, empagliflozin, carvedilol, Olopatadine HCl, metFORMIN, glucose blood, sucralfate, sitaGLIPtin, potassium chloride SA, irbesartan-hydrochlorothiazide, and omeprazole.  No orders of the defined types were placed in this encounter.    Follow-up: Return in about 6 months (around 12/13/2018).  Sanda Lingerhomas Fredric Slabach, MD

## 2018-06-14 NOTE — Patient Instructions (Signed)

## 2018-06-15 ENCOUNTER — Encounter: Payer: Self-pay | Admitting: Internal Medicine

## 2018-06-27 ENCOUNTER — Ambulatory Visit: Payer: Self-pay | Admitting: Gastroenterology

## 2018-07-06 ENCOUNTER — Other Ambulatory Visit: Payer: Self-pay | Admitting: Internal Medicine

## 2018-07-06 DIAGNOSIS — I10 Essential (primary) hypertension: Secondary | ICD-10-CM

## 2018-07-22 ENCOUNTER — Ambulatory Visit: Payer: Medicare Other | Admitting: Cardiovascular Disease

## 2018-07-25 ENCOUNTER — Encounter: Payer: Self-pay | Admitting: *Deleted

## 2018-08-05 ENCOUNTER — Telehealth: Payer: Self-pay

## 2018-08-05 NOTE — Telephone Encounter (Signed)
Please move up next appt to next available.   

## 2018-08-05 NOTE — Telephone Encounter (Signed)
Patient called stating his insurance will not cover [name] and jardiance I advised patient to call insurance company to see what the alternatives are and call us back-patient agreed to do this

## 2018-08-05 NOTE — Telephone Encounter (Signed)
Rescheduled to 3/2 @ 8:45am

## 2018-08-05 NOTE — Telephone Encounter (Signed)
According to pt's formulary, Glipizide 5mg , 10mg , and Metformin (various doses) are covered as Tier 1. [name] and Jardiance are both tier 3.

## 2018-08-05 NOTE — Telephone Encounter (Signed)
Please advise 

## 2018-08-08 ENCOUNTER — Ambulatory Visit: Payer: Medicare Other | Admitting: Endocrinology

## 2018-08-08 ENCOUNTER — Encounter: Payer: Self-pay | Admitting: Endocrinology

## 2018-08-08 VITALS — BP 102/70 | HR 104 | Ht 75.0 in | Wt 271.4 lb

## 2018-08-08 DIAGNOSIS — E1169 Type 2 diabetes mellitus with other specified complication: Secondary | ICD-10-CM | POA: Diagnosis not present

## 2018-08-08 DIAGNOSIS — E669 Obesity, unspecified: Secondary | ICD-10-CM | POA: Diagnosis not present

## 2018-08-08 LAB — POCT GLYCOSYLATED HEMOGLOBIN (HGB A1C): Hemoglobin A1C: 7.3 % — AB (ref 4.0–5.6)

## 2018-08-08 MED ORDER — SITAGLIPTIN PHOSPHATE 100 MG PO TABS: 100.0000 mg | ORAL_TABLET | Freq: Every day | ORAL | 3 refills | Status: DC

## 2018-08-08 NOTE — Patient Instructions (Addendum)
Please continue the same medications. Try getting a discount card from [email or web address].  check your blood sugar once a day.  vary the time of day when you check, between before the 3 meals, and at bedtime.  also check if you have symptoms of your blood sugar being too high or too low.  please keep a record of the readings and bring it to your next appointment here (or you can bring the meter itself).  You can write it on any piece of paper.  please call us sooner if your blood sugar goes below 70, or if you have a lot of readings over 200. Please come back for a follow-up appointment in 4 months.

## 2018-08-08 NOTE — Progress Notes (Signed)
Subjective:    Patient ID: [name], male    DOB: December 28, 1970, 48 y.o.   MRN: [id number]  HPI Pt returns for f/u of diabetes mellitus:  DM type: 2 Dx'ed: 2008 Complications: CAD Therapy: 4 oral meds.   DKA: never Severe hypoglycemia: never.  Pancreatitis: 2019 (GB) Pancreatic imaging: 2011 CT: mild fatty replacement in the head and uncinate process.   Interval history: he takes meds as rx'ed.  He is back on prednisone, for sarcoidosis.  He says cbg's are in the mid-100's.  He stopped [place], due to cost.   Past Medical History:  Diagnosis Date  . Anginal pain (HCC)   . Arthritis    ? 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 Followed by Dr. Nickola Major  . Arthritis    "ankles" (04/13/2016)  . Asthma   . CORONARY ARTERY DISEASE    a. cath 05/2014: mild LAD disease, normal LCx and RCA. Risk factor modification recommended.  Marland Kitchen DIABETES MELLITUS, TYPE II   . Edema   . ERECTILE DYSFUNCTION, ORGANIC   . GERD   . Gout, unspecified   . Heart murmur   . HYPERLIPIDEMIA   . HYPERTENSION   . 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 Pericarditis    recurrent  . POLYNEUROPATHY   . PULMONARY SARCOIDOSIS    Mediastinal lymphadenopathy with biospy proven sarcodosis  . Seizures (HCC)    "none in 4-5 years; don't know what kind; not related to alcohol" (04/13/2016)  . TESTICULAR HYPOFUNCTION   . TIA (transient ischemic attack)    "I don't remember when" (04/13/2016)    Past Surgical History:  Procedure Laterality Date  . BRONCHOSCOPY  08/21/08  . CARDIAC CATHETERIZATION  06/25/2009   minimal disease  . CARDIAC CATHETERIZATION N/A 04/14/2016   Procedure: Left Heart Cath and Coronary Angiography;  Surgeon: Kathleene Hazel, MD;  Location: [facility] INVASIVE CV LAB;  Service: Cardiovascular;  Laterality: N/A;  . CHOLECYSTECTOMY N/A 09/21/2017   Procedure: LAPAROSCOPIC CHOLECYSTECTOMY WITH INTRAOPERATIVE CHOLANGIOGRAM;  Surgeon: Glenna Fellows,  MD;  Location: [facility] ORS;  Service: General;  Laterality: N/A;  . LEFT HEART CATHETERIZATION WITH CORONARY ANGIOGRAM N/A 05/18/2014   Procedure: LEFT HEART CATHETERIZATION WITH CORONARY ANGIOGRAM;  Surgeon: Corky Crafts, MD;  Location: [facility] CATH LAB;  Service: Cardiovascular;  Laterality: N/A;  . MEDIASTINOSCOPY  11/30/08    Social History   Socioeconomic History  . Marital status: Significant Other    Spouse name: Not on file  . Number of children: 3  . Years of education: Not on file  . Highest education level: Not on file  Occupational History  . Occupation: Unemployed  Social Needs  . Financial resource strain: Not on file  . Food insecurity:    Worry: Not on file    Inability: Not on file  . Transportation needs:    Medical: Not on file    Non-medical: Not on file  Tobacco Use  . Smoking status: Former Smoker    Packs/day: 1.00    Years: 10.00    Pack years: 10.00    Types: Cigarettes    Last attempt to quit: 05/18/2014    Years since quitting: 4.2  . Smokeless tobacco: Never Used  Substance and Sexual Activity  . Alcohol use: Yes    Alcohol/week: 0.0 standard drinks    Comment: 04/13/2016 "glass of wine a couple times/month"  . Drug use: No  . Sexual activity: Yes  Lifestyle  . Physical  activity:    Days per week: Not on file    Minutes per session: Not on file  . Stress: Not on file  Relationships  . Social connections:    Talks on phone: Not on file    Gets together: Not on file    Attends religious service: Not on file    Active member of club or organization: Not on file    Attends meetings of clubs or organizations: Not on file    Relationship status: Not on file  . Intimate partner violence:    Fear of current or ex partner: Not on file    Emotionally abused: Not on file    Physically abused: Not on file    Forced sexual activity: Not on file  Other Topics Concern  . Not on file  Social History Narrative   Married, lives with wife and 3 kids.      Currently student. 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 albuterol (VENTOLIN HFA) 108 (90 Base) MCG/ACT inhaler Inhale 2 puffs into the lungs every 6 (six) hours as needed for wheezing or shortness of breath. 3 Inhaler 1  . allopurinol (ZYLOPRIM) 300 MG tablet TAKE 1 TABLET(300 MG) BY MOUTH DAILY 90 tablet 0  . anastrozole (ARIMIDEX) 1 MG tablet Take 1 mg by mouth daily.    Marland Kitchen aspirin EC 81 MG tablet Take 1 tablet (81 mg total) by mouth daily. 30 tablet 3  . carvedilol (COREG) 3.125 MG tablet TAKE 1 TABLET(3.125 MG) BY MOUTH TWICE DAILY WITH A MEAL 180 tablet 0  . cholecalciferol (VITAMIN D) 1000 units tablet Take 1,000 Units by mouth daily.    . colchicine 0.6 MG tablet Take 0.6 mg by mouth every 4 (four) hours as needed (gout pain).    Marland Kitchen empagliflozin (JARDIANCE) 25 MG TABS tablet Take 25 mg by mouth daily. 90 tablet 3  . folic acid (FOLVITE) 1 MG tablet TK 1 T PO QD  1  . glucose blood (ONETOUCH VERIO) test strip 1 each by Other route daily. And lancets 1/day (Patient taking differently: 1 each by Other route daily. ) 100 each 11  . irbesartan-hydrochlorothiazide (AVALIDE) 300-12.5 MG tablet TAKE 1 TABLET BY MOUTH EVERY DAY 90 tablet 0  . levETIRAcetam (KEPPRA) 1000 MG tablet Take 1,000 mg by mouth 2 (two) times daily.    . metFORMIN (GLUCOPHAGE-XR) 500 MG 24 hr tablet TAKE 1 TABLET(500 MG) BY MOUTH TWICE DAILY WITH A MEAL 180 tablet 0  . nitroGLYCERIN (NITROSTAT) 0.4 MG SL tablet Place 1 tablet (0.4 mg total) under the tongue every 5 (five) minutes as needed for chest pain. 25 tablet 3  . Olopatadine HCl 0.2 % SOLN Apply 1 drop to eye daily. 2.5 mL 0  . omeprazole (PRILOSEC) 40 MG capsule Take 1 tablet by mouth twice daily. 60 capsule 2  . pioglitazone (ACTOS) 30 MG tablet Take 1 tablet (30 mg total) by mouth daily. 90 tablet 3  . potassium chloride SA (K-DUR,KLOR-CON) 20 MEQ tablet TAKE  1 TABLET(20 MEQ) BY MOUTH TWICE DAILY 180 tablet 0  . rosuvastatin (CRESTOR) 40 MG tablet Take 1 tablet (40 mg total) by mouth daily. 90 tablet 1  . sucralfate (CARAFATE) 1 g tablet TAKE 1 TABLET(1 GRAM) BY MOUTH FOUR TIMES DAILY AT BEDTIME WITH MEALS 120 tablet 5  . torsemide (DEMADEX) 10 MG tablet Take 2 tablets (20  mg total) by mouth daily. 180 tablet 2  . vitamin B-12 (CYANOCOBALAMIN) 250 MCG tablet Take 250 mcg by mouth daily.     No current facility-administered medications on file prior to visit.     Allergies  Allergen Reactions  . Amlodipine Other (See Comments)    Ankle swelling with 10mg , ok with 5mg  dose  . Sulfamethoxazole Rash    fever  . Sulfonamide Derivatives Rash and Other (See Comments)    Family History  Problem Relation Age of Onset  . Diabetes Mother   . Hypertension Mother   . Diabetes Father   . Heart disease Father 246       Fatal MI  . Heart failure Father     BP 102/70 (BP Location: Left Arm, Patient Position: Sitting, Cuff Size: Large)   Pulse (!) 104   Ht 6\' 3"  (1.905 m)   Wt 271 lb 6.4 oz (123.1 kg)   SpO2 95%   BMI 33.92 kg/m    Review of Systems He denies hypoglycemia.      Objective:   Physical Exam VITAL SIGNS:  See vs page GENERAL: no distress Pulses: dorsalis pedis intact bilat.   MSK: no deformity of the feet CV: no leg edema Skin:  no ulcer on the feet.  normal color and temp on the feet.   Neuro: sensation is intact to touch on the feet.    Lab Results  Component Value Date   TSH 0.62 06/14/2018   T4TOTAL 5.9 03/01/2017    Lab Results  Component Value Date   HGBA1C 7.3 (A) 08/08/2018        Assessment & Plan:  Type 2 DM: worse Sarcoidosis: he need aggressive glycemic control to minimize glycemic excursions due to prednisone   Patient Instructions  Please continue the same medications. Try getting a discount card from [email or web address].  check your blood sugar once a day.  vary the time of day when you check,  between before the 3 meals, and at bedtime.  also check if you have symptoms of your blood sugar being too high or too low.  please keep a record of the readings and bring it to your next appointment here (or you can bring the meter itself).  You can write it on any piece of paper.  please call us sooner if your blood sugar goes below 70, or if you have a lot of readings over 200. Please come back for a follow-up appointment in 4 months.

## 2018-08-09 ENCOUNTER — Other Ambulatory Visit: Payer: Self-pay | Admitting: Internal Medicine

## 2018-08-09 DIAGNOSIS — I158 Other secondary hypertension: Secondary | ICD-10-CM | POA: Diagnosis not present

## 2018-08-09 DIAGNOSIS — E1169 Type 2 diabetes mellitus with other specified complication: Secondary | ICD-10-CM

## 2018-08-09 DIAGNOSIS — D860 Sarcoidosis of lung: Secondary | ICD-10-CM | POA: Diagnosis not present

## 2018-08-09 DIAGNOSIS — I10 Essential (primary) hypertension: Secondary | ICD-10-CM

## 2018-08-09 DIAGNOSIS — M109 Gout, unspecified: Secondary | ICD-10-CM | POA: Diagnosis not present

## 2018-08-09 DIAGNOSIS — M10052 Idiopathic gout, left hip: Secondary | ICD-10-CM | POA: Diagnosis not present

## 2018-08-09 DIAGNOSIS — E669 Obesity, unspecified: Secondary | ICD-10-CM

## 2018-08-09 MED ORDER — IRBESARTAN-HYDROCHLOROTHIAZIDE 300-12.5 MG PO TABS: 1.0000 | ORAL_TABLET | Freq: Every day | ORAL | 0 refills | Status: DC

## 2018-08-09 NOTE — Telephone Encounter (Signed)
Copied from CRM [id number]. Topic: Quick Communication - Rx Refill/Question >> Aug 09, 2018 10:47 AM Percival Spanish wrote: Medication irbesartan-hydrochlorothiazide (AVALIDE) 300-12.5 MG tablet     pt had CPE  06/27/18  Has the patient contacted their pharmacy yes    Preferred Pharmacy  [facility]   Agent: Please be advised that RX refills may take up to 3 business days. We ask that you follow-up with your pharmacy.

## 2018-08-17 ENCOUNTER — Ambulatory Visit: Payer: Self-pay | Admitting: Endocrinology

## 2018-08-18 ENCOUNTER — Other Ambulatory Visit: Payer: Self-pay | Admitting: Internal Medicine

## 2018-08-18 DIAGNOSIS — E1169 Type 2 diabetes mellitus with other specified complication: Secondary | ICD-10-CM

## 2018-08-18 DIAGNOSIS — E669 Obesity, unspecified: Principal | ICD-10-CM

## 2018-08-18 MED ORDER — METFORMIN HCL ER 500 MG PO TB24: ORAL_TABLET | ORAL | 1 refills | Status: DC

## 2018-08-26 ENCOUNTER — Other Ambulatory Visit: Payer: Self-pay | Admitting: Internal Medicine

## 2018-09-07 ENCOUNTER — Other Ambulatory Visit: Payer: Self-pay | Admitting: Internal Medicine

## 2018-09-07 DIAGNOSIS — E1169 Type 2 diabetes mellitus with other specified complication: Secondary | ICD-10-CM

## 2018-09-07 DIAGNOSIS — I10 Essential (primary) hypertension: Secondary | ICD-10-CM

## 2018-09-07 DIAGNOSIS — E669 Obesity, unspecified: Secondary | ICD-10-CM

## 2018-10-09 ENCOUNTER — Other Ambulatory Visit: Payer: Self-pay | Admitting: Cardiology

## 2018-10-25 DIAGNOSIS — D869 Sarcoidosis, unspecified: Secondary | ICD-10-CM | POA: Diagnosis not present

## 2018-10-25 DIAGNOSIS — I10 Essential (primary) hypertension: Secondary | ICD-10-CM | POA: Diagnosis not present

## 2018-10-25 DIAGNOSIS — E119 Type 2 diabetes mellitus without complications: Secondary | ICD-10-CM | POA: Diagnosis not present

## 2018-10-25 DIAGNOSIS — H11153 Pinguecula, bilateral: Secondary | ICD-10-CM | POA: Diagnosis not present

## 2018-10-26 ENCOUNTER — Ambulatory Visit: Payer: Self-pay | Admitting: Internal Medicine

## 2018-10-26 ENCOUNTER — Other Ambulatory Visit: Payer: Self-pay | Admitting: Internal Medicine

## 2018-10-26 ENCOUNTER — Telehealth: Payer: Self-pay | Admitting: Internal Medicine

## 2018-10-26 DIAGNOSIS — I10 Essential (primary) hypertension: Secondary | ICD-10-CM

## 2018-10-26 MED ORDER — POTASSIUM CHLORIDE CRYS ER 20 MEQ PO TBCR: EXTENDED_RELEASE_TABLET | ORAL | 0 refills | Status: DC

## 2018-10-26 NOTE — Telephone Encounter (Signed)
Pt. Reports he ran out of his potassium 1 week ago and began having palpitations, mainly with exertion. Denies any chest pain. States this happens when his potassium is low. Warm transfer to [facility] for a visit.  Answer Assessment - Initial Assessment Questions 1. DESCRIPTION: "Please describe your heart rate or heart beat that you are having" (e.g., fast/slow, regular/irregular, skipped or extra beats, "palpitations")     Palpitations 2. ONSET: "When did it start?" (Minutes, hours or days)      1 week ago 3. DURATION: "How long does it last" (e.g., seconds, minutes, hours)     Seconds 4. PATTERN "Does it come and go, or has it been constant since it started?"  "Does it get worse with exertion?"   "Are you feeling it now?"     Comes and goes 5. TAP: "Using your hand, can you tap out what you are feeling on a chair or table in front of you, so that I can hear?" (Note: not all patients can do this)       No 6. HEART RATE: "Can you tell me your heart rate?" "How many beats in 15 seconds?"  (Note: not all patients can do this)       93 7. RECURRENT SYMPTOM: "Have you ever had this before?" If so, ask: "When was the last time?" and "What happened that time?"      Yes 8. CAUSE: "What do you think is causing the palpitations?"     Low potassium 9. CARDIAC HISTORY: "Do you have any history of heart disease?" (e.g., heart attack, angina, bypass surgery, angioplasty, arrhythmia)      Angina 10. OTHER SYMPTOMS: "Do you have any other symptoms?" (e.g., dizziness, chest pain, sweating, difficulty breathing)       No 11. PREGNANCY: "Is there any chance you are pregnant?" "When was your last menstrual period?"       n/a  Protocols used: HEART RATE AND HEARTBEAT QUESTIONS-A-AH

## 2018-10-26 NOTE — Telephone Encounter (Signed)
RX sent  TJ 

## 2018-10-26 NOTE — Telephone Encounter (Signed)
Copied from CRM [id number]. Topic: Quick Communication - Rx Refill/Question >> Oct 26, 2018  1:42 PM Mcneil, Ja-Kwan wrote: Medication: potassium chloride SA (K-DUR,KLOR-CON) 20 MEQ tablet  Has the patient contacted their pharmacy? yes   Preferred Pharmacy (with phone number or street name): [id number] - [place], [place] - [address] AT [facility] OF [address] [phone] (Phone)  [phone] (Fax)  Agent: Please be advised that RX refills may take up to 3 business days. We ask that you follow-up with your pharmacy.

## 2018-10-27 ENCOUNTER — Other Ambulatory Visit: Payer: Self-pay

## 2018-10-27 ENCOUNTER — Telehealth: Payer: Self-pay | Admitting: *Deleted

## 2018-10-27 ENCOUNTER — Ambulatory Visit (INDEPENDENT_AMBULATORY_CARE_PROVIDER_SITE_OTHER): Payer: Medicare Other | Admitting: Internal Medicine

## 2018-10-27 ENCOUNTER — Encounter: Payer: Self-pay | Admitting: Internal Medicine

## 2018-10-27 VITALS — BP 124/84 | HR 93 | Temp 98.0°F | Resp 16 | Ht 75.0 in | Wt 287.5 lb

## 2018-10-27 DIAGNOSIS — R002 Palpitations: Secondary | ICD-10-CM

## 2018-10-27 DIAGNOSIS — E669 Obesity, unspecified: Secondary | ICD-10-CM

## 2018-10-27 DIAGNOSIS — E1169 Type 2 diabetes mellitus with other specified complication: Secondary | ICD-10-CM | POA: Diagnosis not present

## 2018-10-27 NOTE — Telephone Encounter (Signed)
3 day ZIO XT long term holter monitor will be mailed to patients home.  Instructions reviewed briefly as they are included in the monitor kit.

## 2018-10-27 NOTE — Patient Instructions (Signed)
Palpitations  Palpitations are feelings that your heartbeat is irregular or is faster than normal. It may feel like your heart is fluttering or skipping a beat. Palpitations are usually not a serious problem. They may be caused by many things, including smoking, caffeine, alcohol, stress, and certain medicines or drugs. Most causes of palpitations are not serious. However, some palpitations can be a sign of a serious problem. You may need further tests to rule out serious medical problems.  Follow these instructions at home:         Pay attention to any changes in your condition. Take these actions to help manage your symptoms:  Eating and drinking  Avoid foods and drinks that may cause palpitations. These may include:  Caffeinated coffee, tea, soft drinks, diet pills, and energy drinks.  Chocolate.  Alcohol.  Lifestyle  Take steps to reduce your stress and anxiety. Things that can help you relax include:  Yoga.  Mind-body activities, such as deep breathing, meditation, or using words and images to create positive thoughts (guided imagery).  Physical activity, such as swimming, jogging, or walking. Tell your health care provider if your palpitations increase with activity. If you have chest pain or shortness of breath with activity, do not continue the activity until you are seen by your health care provider.  Biofeedback. This is a method that helps you learn to use your mind to control things in your body, such as your heartbeat.  Do not use drugs, including cocaine or ecstasy. Do not use marijuana.  Get plenty of rest and sleep. Keep a regular bed time.  General instructions  Take over-the-counter and prescription medicines only as told by your health care provider.  Do not use any products that contain nicotine or tobacco, such as cigarettes and e-cigarettes. If you need help quitting, ask your health care provider.  Keep all follow-up visits as told by your health care provider. This is important. These may  include visits for further testing if palpitations do not go away or get worse.  Contact a health care provider if you:  Continue to have a fast or irregular heartbeat after 24 hours.  Notice that your palpitations occur more often.  Get help right away if you:  Have chest pain or shortness of breath.  Have a severe headache.  Feel dizzy or you faint.  Summary  Palpitations are feelings that your heartbeat is irregular or is faster than normal. It may feel like your heart is fluttering or skipping a beat.  Palpitations may be caused by many things, including smoking, caffeine, alcohol, stress, certain medicines, and drugs.  Although most causes of palpitations are not serious, some causes can be a sign of a serious medical problem.  Get help right away if you faint or have chest pain, shortness of breath, a severe headache, or dizziness.  This information is not intended to replace advice given to you by your health care provider. Make sure you discuss any questions you have with your health care provider.  Document Released: 05/22/2000 Document Revised: 07/07/2017 Document Reviewed: 07/07/2017  Elsevier Interactive Patient Education  2019 [employer]

## 2018-10-27 NOTE — Progress Notes (Signed)
Subjective:  Patient ID: [name], male    DOB: [birth year]  Age: 48 y.o. MRN: [id number]  CC: Hypertension   HPI [name] presents for concerns about a one-week history of palpitations.  He complains that for the last week he has noticed an elevated heart rate only with exertion.  He denies skipped beats, extra beats, or a low heart rate.  He has not chkecked his pulse.  He denies DOE, CP, dizziness, lightheadedness, near syncope, fatigue, or edema.  Outpatient Medications Prior to Visit  Medication Sig Dispense Refill   albuterol (VENTOLIN HFA) 108 (90 Base) MCG/ACT inhaler Inhale 2 puffs into the lungs every 6 (six) hours as needed for wheezing or shortness of breath. 3 Inhaler 1   allopurinol (ZYLOPRIM) 300 MG tablet TAKE 1 TABLET(300 MG) BY MOUTH DAILY 90 tablet 0   anastrozole (ARIMIDEX) 1 MG tablet Take 1 mg by mouth daily.     aspirin EC 81 MG tablet Take 1 tablet (81 mg total) by mouth daily. 30 tablet 3   carvedilol (COREG) 3.125 MG tablet TAKE 1 TABLET(3.125 MG) BY MOUTH TWICE DAILY WITH A MEAL 180 tablet 0   cholecalciferol (VITAMIN D) 1000 units tablet Take 1,000 Units by mouth daily.     colchicine 0.6 MG tablet Take 0.6 mg by mouth every 4 (four) hours as needed (gout pain).     empagliflozin (JARDIANCE) 25 MG TABS tablet Take 25 mg by mouth daily. 90 tablet 3   folic acid (FOLVITE) 1 MG tablet TK 1 T PO QD  1   glucose blood (ONETOUCH VERIO) test strip 1 each by Other route daily. And lancets 1/day (Patient taking differently: 1 each by Other route daily. ) 100 each 11   irbesartan-hydrochlorothiazide (AVALIDE) 300-12.5 MG tablet TAKE 1 TABLET BY MOUTH DAILY 90 tablet 0   levETIRAcetam (KEPPRA) 1000 MG tablet Take 1,000 mg by mouth 2 (two) times daily.     metFORMIN (GLUCOPHAGE-XR) 500 MG 24 hr tablet TAKE 1 TABLET(500 MG) BY MOUTH TWICE DAILY WITH A MEAL 180 tablet 1   nitroGLYCERIN (NITROSTAT) 0.4 MG SL tablet Place 1 tablet (0.4 mg total)  under the tongue every 5 (five) minutes as needed for chest pain. 25 tablet 3   Olopatadine HCl 0.2 % SOLN Apply 1 drop to eye daily. 2.5 mL 0   omeprazole (PRILOSEC) 40 MG capsule Take 1 tablet by mouth twice daily. 60 capsule 2   pioglitazone (ACTOS) 30 MG tablet Take 1 tablet (30 mg total) by mouth daily. 90 tablet 3   potassium chloride SA (K-DUR) 20 MEQ tablet TAKE 1 TABLET(20 MEQ) BY MOUTH TWICE DAILY 180 tablet 0   rosuvastatin (CRESTOR) 40 MG tablet Take 1 tablet (40 mg total) by mouth daily. 90 tablet 1   sitaGLIPtin ([name]) 100 MG tablet Take 1 tablet (100 mg total) by mouth daily. 30 tablet 3   sucralfate (CARAFATE) 1 g tablet TAKE 1 TABLET(1 GRAM) BY MOUTH FOUR TIMES DAILY AT BEDTIME WITH MEALS 120 tablet 5   torsemide (DEMADEX) 10 MG tablet TAKE 2 TABLETS(20 MG) BY MOUTH DAILY 180 tablet 0   vitamin B-12 (CYANOCOBALAMIN) 250 MCG tablet Take 250 mcg by mouth daily.     No facility-administered medications prior to visit.     ROS Review of Systems  Constitutional: Positive for unexpected weight change (wt gain). Negative for diaphoresis and fatigue.  Respiratory: Negative for cough, chest tightness, shortness of breath and wheezing.   Cardiovascular: Positive for  palpitations. Negative for chest pain and leg swelling.  Gastrointestinal: Negative for abdominal pain, constipation, diarrhea, nausea and vomiting.  Genitourinary: Negative.  Negative for difficulty urinating and dysuria.  Musculoskeletal: Negative for arthralgias and myalgias.  Skin: Negative.  Negative for color change.  Neurological: Negative for dizziness, weakness and light-headedness.  Hematological: Negative for adenopathy. Does not bruise/bleed easily.  Psychiatric/Behavioral: Negative.     Objective:  BP 124/84 (BP Location: Left Arm, Patient Position: Sitting, Cuff Size: Large)    Pulse 93    Temp 98 F (36.7 C) (Oral)    Resp 16    Ht 6\' 3"  (1.905 m)    Wt 287 lb 8 oz (130.4 kg)    SpO2 96%     BMI 35.94 kg/m   BP Readings from Last 3 Encounters:  10/27/18 124/84  08/08/18 102/70  06/14/18 124/70    Wt Readings from Last 3 Encounters:  10/27/18 287 lb 8 oz (130.4 kg)  08/08/18 271 lb 6.4 oz (123.1 kg)  06/14/18 271 lb (122.9 kg)    Physical Exam Vitals signs reviewed.  Constitutional:      Appearance: He is obese. He is not ill-appearing or diaphoretic.  HENT:     Nose: Nose normal.     Mouth/Throat:     Mouth: Mucous membranes are moist.  Eyes:     General: No scleral icterus.    Conjunctiva/sclera: Conjunctivae normal.  Neck:     Musculoskeletal: Normal range of motion. No neck rigidity.  Cardiovascular:     Rate and Rhythm: Normal rate and regular rhythm.     Heart sounds: No murmur. No gallop.      Comments: EKG ----  Sinus  Rhythm  -Right atrial enlargement.   -Inferolateral ST-elevation -possible repolarization variant -consider injury.   BORDERLINE- no change from the prior EKG Pulmonary:     Effort: Pulmonary effort is normal.     Breath sounds: No stridor. No wheezing, rhonchi or rales.  Abdominal:     General: Bowel sounds are normal.     Palpations: There is no hepatomegaly, splenomegaly or mass.     Tenderness: There is no abdominal tenderness. There is no guarding.  Musculoskeletal: Normal range of motion.     Right lower leg: No edema.     Left lower leg: No edema.  Lymphadenopathy:     Cervical: No cervical adenopathy.  Skin:    General: Skin is warm.     Coloration: Skin is not pale.  Neurological:     General: No focal deficit present.  Psychiatric:        Mood and Affect: Mood normal.        Behavior: Behavior normal.     Lab Results  Component Value Date   WBC 5.9 06/14/2018   HGB 16.0 06/14/2018   HCT 47.2 06/14/2018   PLT 281.0 06/14/2018   GLUCOSE 106 (H) 06/14/2018   CHOL 166 06/14/2018   TRIG 159.0 (H) 06/14/2018   HDL 46.10 06/14/2018   LDLCALC 88 06/14/2018   ALT 19 06/14/2018   AST 17 06/14/2018   NA 142  06/14/2018   K 4.0 06/14/2018   CL 100 06/14/2018   CREATININE 1.06 06/14/2018   BUN 14 06/14/2018   CO2 32 06/14/2018   TSH 0.62 06/14/2018   PSA 1.57 06/14/2018   INR 0.89 05/24/2016   HGBA1C 7.3 (A) 08/08/2018   MICROALBUR <0.7 06/14/2018    Nm Hepato W/eject Fract  Result Date: 09/20/2017 CLINICAL DATA:  Right upper quadrant pain with nausea and vomiting EXAM: NUCLEAR MEDICINE HEPATOBILIARY IMAGING TECHNIQUE: Sequential images of the abdomen were obtained out to 60 minutes following intravenous administration of radiopharmaceutical. RADIOPHARMACEUTICALS:  5.3 mCi Tc-38m  Choletec IV COMPARISON:  Ultrasound from 09/19/2017 FINDINGS: There is prompt uptake of bili32ary tracer throughout the liver. Visualization of the biliary tree with opacification of the small bowel is noted. No significant gallbladder opacification is noted. 5.3 mg of morphine was then administered intravenously and repeat imaging was obtained. On significant delayed images following morphine administration there was filling within the gallbladder. IMPRESSION: Patent cystic duct although the gallbladder fills in a somewhat delayed fashion likely related consistent with abnormal gallbladder function. This may represent some chronic cholecystitis. No biliary obstruction is seen. Electronically Signed   By: Alcide CleverMark  Lukens M.D.   On: 09/20/2017 15:39   Dg Chest Port 1 View  Result Date: 09/19/2017 CLINICAL DATA:  Shortness of breath. Heart murmur. Coronary artery disease. EXAM: PORTABLE CHEST 1 VIEW COMPARISON:  09/15/2017 FINDINGS: The heart size and mediastinal contours are within normal limits. Both lungs are clear. The visualized skeletal structures are unremarkable. IMPRESSION: No active disease. Electronically Signed   By: Myles RosenthalJohn  Stahl M.D.   On: 09/19/2017 11:05   [place] Abdomen Limited Ruq  Result Date: 09/19/2017 CLINICAL DATA:  Abdominal pain since Wednesday, pancreatitis. EXAM: ULTRASOUND ABDOMEN LIMITED RIGHT UPPER  QUADRANT COMPARISON:  None. FINDINGS: Gallbladder: Small layering gallstones. No gallbladder wall thickening, pericholecystic fluid, sonographic Murphy's sign or other secondary signs of acute cholecystitis. Common bile duct: Diameter: 4 mm Liver: No focal lesion identified. Diffusely echogenic parenchyma indicating fatty infiltration. Portal vein is patent on color Doppler imaging with normal direction of blood flow towards the liver. IMPRESSION: 1. Cholelithiasis without evidence of acute cholecystitis. 2. No bile duct dilatation. 3. Fatty infiltration of the liver. Electronically Signed   By: Bary RichardStan  Maynard M.D.   On: 09/19/2017 14:16    Assessment & Plan:   [name] was seen today for hypertension.  Diagnoses and all orders for this visit:  Palpitations- He describes an elevated heart rate with exertion.  His EKG shows normal sinus rhythm with no changes. He has no alarming symptoms.  I have asked him to undergo a 48-hour Holter monitor to try to identify his heart rate when he feels like it is elevated.  He will let me know if he develops any new or worsening symptoms. -     EKG 12-Lead -     Holter monitor - 48 hour; Future  Diabetes mellitus type 2 in obese [facility](HCC) -     Ambulatory referral to Ophthalmology   I am having [name] maintain his aspirin EC, cholecalciferol, folic acid, rosuvastatin, allopurinol, nitroGLYCERIN, albuterol, vitamin B-12, levETIRAcetam, colchicine, anastrozole, pioglitazone, empagliflozin, Olopatadine HCl, glucose blood, sucralfate, omeprazole, carvedilol, sitaGLIPtin, metFORMIN, irbesartan-hydrochlorothiazide, torsemide, and potassium chloride SA.  No orders of the defined types were placed in this encounter.    Follow-up: Return in about 4 weeks (around 11/24/2018).  Sanda Lingerhomas Darwin Rothlisberger, MD

## 2018-10-28 ENCOUNTER — Encounter: Payer: Self-pay | Admitting: Internal Medicine

## 2018-11-01 ENCOUNTER — Ambulatory Visit (INDEPENDENT_AMBULATORY_CARE_PROVIDER_SITE_OTHER): Payer: Medicare Other

## 2018-11-01 DIAGNOSIS — R002 Palpitations: Secondary | ICD-10-CM | POA: Diagnosis not present

## 2018-11-11 DIAGNOSIS — N410 Acute prostatitis: Secondary | ICD-10-CM | POA: Diagnosis not present

## 2018-11-11 DIAGNOSIS — E291 Testicular hypofunction: Secondary | ICD-10-CM | POA: Diagnosis not present

## 2018-11-14 ENCOUNTER — Other Ambulatory Visit: Payer: Self-pay

## 2018-11-15 ENCOUNTER — Encounter: Payer: Self-pay | Admitting: Internal Medicine

## 2018-11-16 DIAGNOSIS — E291 Testicular hypofunction: Secondary | ICD-10-CM | POA: Diagnosis not present

## 2018-11-17 DIAGNOSIS — E291 Testicular hypofunction: Secondary | ICD-10-CM | POA: Diagnosis not present

## 2018-11-17 DIAGNOSIS — N5201 Erectile dysfunction due to arterial insufficiency: Secondary | ICD-10-CM | POA: Diagnosis not present

## 2018-11-17 DIAGNOSIS — N401 Enlarged prostate with lower urinary tract symptoms: Secondary | ICD-10-CM | POA: Diagnosis not present

## 2018-11-17 DIAGNOSIS — R351 Nocturia: Secondary | ICD-10-CM | POA: Diagnosis not present

## 2018-12-08 ENCOUNTER — Ambulatory Visit: Payer: Self-pay | Admitting: Endocrinology

## 2018-12-08 DIAGNOSIS — Z0289 Encounter for other administrative examinations: Secondary | ICD-10-CM

## 2018-12-19 ENCOUNTER — Other Ambulatory Visit: Payer: Self-pay | Admitting: Endocrinology

## 2018-12-19 ENCOUNTER — Other Ambulatory Visit: Payer: Self-pay

## 2018-12-19 DIAGNOSIS — E1169 Type 2 diabetes mellitus with other specified complication: Secondary | ICD-10-CM

## 2018-12-19 MED ORDER — JARDIANCE 25 MG PO TABS: 25.0000 mg | ORAL_TABLET | Freq: Every day | ORAL | 0 refills | Status: DC

## 2019-01-29 ENCOUNTER — Other Ambulatory Visit: Payer: Self-pay | Admitting: Endocrinology

## 2019-01-29 DIAGNOSIS — E1169 Type 2 diabetes mellitus with other specified complication: Secondary | ICD-10-CM

## 2019-01-30 NOTE — Telephone Encounter (Signed)
LOV 08/08/18. Per your note, pt was advised to schedule f/u in 4 mo. No future appts noted. Please advise

## 2019-01-30 NOTE — Telephone Encounter (Signed)
Please refill x 1 F/u is due  

## 2019-02-08 ENCOUNTER — Telehealth: Payer: Self-pay | Admitting: Endocrinology

## 2019-02-08 NOTE — Telephone Encounter (Signed)
Arbie Cookey with [employer] called on behalf of the patient. Patient's ph# is [phone] re: [name] and [name]. Arbie Cookey states on behalf of the patient to have a Nurse contact patient re: switching Jardiance and [name] to more affordable medications-Please call patient at the ph# listed above to advise.

## 2019-02-08 NOTE — Telephone Encounter (Signed)
This is a discussion pt will need to have with his insurance carrier to determine WHAT affordable medications are covered under his plan AND within the same category as what is currently being Rx'd. Dr. Loanne Drilling will consider writing Rx's once pt has this discussion with his insurance carrier.

## 2019-02-10 ENCOUNTER — Other Ambulatory Visit: Payer: Self-pay | Admitting: Internal Medicine

## 2019-02-10 DIAGNOSIS — E1169 Type 2 diabetes mellitus with other specified complication: Secondary | ICD-10-CM

## 2019-02-10 DIAGNOSIS — I10 Essential (primary) hypertension: Secondary | ICD-10-CM

## 2019-02-10 MED ORDER — IRBESARTAN-HYDROCHLOROTHIAZIDE 300-12.5 MG PO TABS: 1.0000 | ORAL_TABLET | Freq: Every day | ORAL | 0 refills | Status: DC

## 2019-02-16 ENCOUNTER — Ambulatory Visit: Payer: Medicare Other | Admitting: Internal Medicine

## 2019-02-20 ENCOUNTER — Ambulatory Visit (INDEPENDENT_AMBULATORY_CARE_PROVIDER_SITE_OTHER): Payer: Medicare Other | Admitting: Internal Medicine

## 2019-02-20 ENCOUNTER — Other Ambulatory Visit: Payer: Self-pay

## 2019-02-20 ENCOUNTER — Other Ambulatory Visit (INDEPENDENT_AMBULATORY_CARE_PROVIDER_SITE_OTHER): Payer: Medicare Other

## 2019-02-20 ENCOUNTER — Encounter: Payer: Self-pay | Admitting: Internal Medicine

## 2019-02-20 VITALS — BP 144/90 | HR 78 | Temp 98.7°F | Resp 16 | Ht 75.0 in | Wt 291.2 lb

## 2019-02-20 DIAGNOSIS — K760 Fatty (change of) liver, not elsewhere classified: Secondary | ICD-10-CM

## 2019-02-20 DIAGNOSIS — I2510 Atherosclerotic heart disease of native coronary artery without angina pectoris: Secondary | ICD-10-CM | POA: Diagnosis not present

## 2019-02-20 DIAGNOSIS — I10 Essential (primary) hypertension: Secondary | ICD-10-CM

## 2019-02-20 DIAGNOSIS — E1169 Type 2 diabetes mellitus with other specified complication: Secondary | ICD-10-CM

## 2019-02-20 DIAGNOSIS — E669 Obesity, unspecified: Secondary | ICD-10-CM

## 2019-02-20 DIAGNOSIS — Z23 Encounter for immunization: Secondary | ICD-10-CM | POA: Diagnosis not present

## 2019-02-20 DIAGNOSIS — E785 Hyperlipidemia, unspecified: Secondary | ICD-10-CM

## 2019-02-20 LAB — POCT GLYCOSYLATED HEMOGLOBIN (HGB A1C): Hemoglobin A1C: 8.2 % — AB (ref 4.0–5.6)

## 2019-02-20 MED ORDER — ROSUVASTATIN CALCIUM 40 MG PO TABS: 40.0000 mg | ORAL_TABLET | Freq: Every day | ORAL | 1 refills | Status: DC

## 2019-02-20 MED ORDER — SYNJARDY XR 12.5-1000 MG PO TB24: 2.0000 | ORAL_TABLET | Freq: Every day | ORAL | 0 refills | Status: DC

## 2019-02-20 NOTE — Patient Instructions (Signed)
Type 2 Diabetes Mellitus, Diagnosis, Adult Type 2 diabetes (type 2 diabetes mellitus) is a long-term (chronic) disease. In type 2 diabetes, one or both of these problems may be present:  The pancreas does not make enough of a hormone called insulin.  Cells in the body do not respond properly to insulin that the body makes (insulin resistance). Normally, insulin allows blood sugar (glucose) to enter cells in the body. The cells use glucose for energy. Insulin resistance or lack of insulin causes excess glucose to build up in the blood instead of going into cells. As a result, high blood glucose (hyperglycemia) develops. What increases the risk? The following factors may make you more likely to develop type 2 diabetes:  Having a family member with type 2 diabetes.  Being overweight or obese.  Having an inactive (sedentary) lifestyle.  Having been diagnosed with insulin resistance.  Having a history of prediabetes, gestational diabetes, or polycystic ovary syndrome (PCOS).  Being of American-Indian, African-American, Hispanic/Latino, or Asian/Pacific Islander descent. What are the signs or symptoms? In the early stage of this condition, you may not have symptoms. Symptoms develop slowly and may include:  Increased thirst (polydipsia).  Increased hunger(polyphagia).  Increased urination (polyuria).  Increased urination during the night (nocturia).  Unexplained weight loss.  Frequent infections that keep coming back (recurring).  Fatigue.  Weakness.  Vision changes, such as blurry vision.  Cuts or bruises that are slow to heal.  Tingling or numbness in the hands or feet.  Dark patches on the skin (acanthosis nigricans). How is this diagnosed? This condition is diagnosed based on your symptoms, your medical history, a physical exam, and your blood glucose level. Your blood glucose may be checked with one or more of the following blood tests:  A fasting blood glucose (FBG)  test. You will not be allowed to eat (you will fast) for 8 hours or longer before a blood sample is taken.  A random blood glucose test. This test checks blood glucose at any time of day regardless of when you ate.  An A1c (hemoglobin A1c) blood test. This test provides information about blood glucose control over the previous 2-3 months.  An oral glucose tolerance test (OGTT). This test measures your blood glucose at two times: ? After fasting. This is your baseline blood glucose level. ? Two hours after drinking a beverage that contains glucose. You may be diagnosed with type 2 diabetes if:  Your FBG level is 126 mg/dL (7.0 mmol/L) or higher.  Your random blood glucose level is 200 mg/dL (11.1 mmol/L) or higher.  Your A1c level is 6.5% or higher.  Your OGTT result is higher than 200 mg/dL (11.1 mmol/L). These blood tests may be repeated to confirm your diagnosis. How is this treated? Your treatment may be managed by a specialist called an endocrinologist. Type 2 diabetes may be treated by following instructions from your health care provider about:  Making diet and lifestyle changes. This may include: ? Following an individualized nutrition plan that is developed by a diet and nutrition specialist (registered dietitian). ? Exercising regularly. ? Finding ways to manage stress.  Checking your blood glucose level as often as told.  Taking diabetes medicines or insulin daily. This helps to keep your blood glucose levels in the healthy range. ? If you use insulin, you may need to adjust the dosage depending on how physically active you are and what foods you eat. Your health care provider will tell you how to adjust your dosage.    Taking medicines to help prevent complications from diabetes, such as: ? Aspirin. ? Medicine to lower cholesterol. ? Medicine to control blood pressure. Your health care provider will set individualized treatment goals for you. Your goals will be based on  your age, other medical conditions you have, and how you respond to diabetes treatment. Generally, the goal of treatment is to maintain the following blood glucose levels:  Before meals (preprandial): 80-130 mg/dL (4.4-7.2 mmol/L).  After meals (postprandial): below 180 mg/dL (10 mmol/L).  A1c level: less than 7%. Follow these instructions at home: Questions to ask your health care provider  Consider asking the following questions: ? Do I need to meet with a diabetes educator? ? Where can I find a support group for people with diabetes? ? What equipment will I need to manage my diabetes at home? ? What diabetes medicines do I need, and when should I take them? ? How often do I need to check my blood glucose? ? What number can I call if I have questions? ? When is my next appointment? General instructions  Take over-the-counter and prescription medicines only as told by your health care provider.  Keep all follow-up visits as told by your health care provider. This is important.  For more information about diabetes, visit: ? American Diabetes Association (ADA): www.diabetes.org ? American Association of Diabetes Educators (AADE): www.diabeteseducator.org Contact a health care provider if:  Your blood glucose is at or above 240 mg/dL (13.3 mmol/L) for 2 days in a row.  You have been sick or have had a fever for 2 days or longer, and you are not getting better.  You have any of the following problems for more than 6 hours: ? You cannot eat or drink. ? You have nausea and vomiting. ? You have diarrhea. Get help right away if:  Your blood glucose is lower than 54 mg/dL (3.0 mmol/L).  You become confused or you have trouble thinking clearly.  You have difficulty breathing.  You have moderate or large ketone levels in your urine. Summary  Type 2 diabetes (type 2 diabetes mellitus) is a long-term (chronic) disease. In type 2 diabetes, the pancreas does not make enough of a  hormone called insulin, or cells in the body do not respond properly to insulin that the body makes (insulin resistance).  This condition is treated by making diet and lifestyle changes and taking diabetes medicines or insulin.  Your health care provider will set individualized treatment goals for you. Your goals will be based on your age, other medical conditions you have, and how you respond to diabetes treatment.  Keep all follow-up visits as told by your health care provider. This is important. This information is not intended to replace advice given to you by your health care provider. Make sure you discuss any questions you have with your health care provider. Document Released: 05/25/2005 Document Revised: 07/23/2017 Document Reviewed: 06/28/2015 Elsevier Patient Education  2020 [employer]  

## 2019-02-20 NOTE — Progress Notes (Signed)
Subjective:  Patient ID: [name], male    DOB: 09/13/1970  Age: 48 y.o. MRN: [id number]  CC: Hypertension and Diabetes   HPI Xavier MedicoCarlos T Boisclair presents for f/up - He complains of weight gain.  He is not taking Jardiance and [name] because they were too expensive.  He is taking metformin and pioglitazone.  He has not been monitoring his blood pressure or his blood sugar.  Outpatient Medications Prior to Visit  Medication Sig Dispense Refill  . albuterol (VENTOLIN HFA) 108 (90 Base) MCG/ACT inhaler Inhale 2 puffs into the lungs every 6 (six) hours as needed for wheezing or shortness of breath. 3 Inhaler 1  . allopurinol (ZYLOPRIM) 300 MG tablet TAKE 1 TABLET(300 MG) BY MOUTH DAILY 90 tablet 0  . anastrozole (ARIMIDEX) 1 MG tablet Take 1 mg by mouth daily.    Marland Kitchen. aspirin EC 81 MG tablet Take 1 tablet (81 mg total) by mouth daily. 30 tablet 3  . carvedilol (COREG) 3.125 MG tablet TAKE 1 TABLET(3.125 MG) BY MOUTH TWICE DAILY WITH A MEAL 180 tablet 0  . cholecalciferol (VITAMIN D) 1000 units tablet Take 1,000 Units by mouth daily.    . colchicine 0.6 MG tablet Take 0.6 mg by mouth every 4 (four) hours as needed (gout pain).    . folic acid (FOLVITE) 1 MG tablet TK 1 T PO QD  1  . glucose blood (ONETOUCH VERIO) test strip 1 each by Other route daily. And lancets 1/day (Patient taking differently: 1 each by Other route daily. ) 100 each 11  . irbesartan-hydrochlorothiazide (AVALIDE) 300-12.5 MG tablet Take 1 tablet by mouth daily. 90 tablet 0  . levETIRAcetam (KEPPRA) 1000 MG tablet Take 1,000 mg by mouth 2 (two) times daily.    . nitroGLYCERIN (NITROSTAT) 0.4 MG SL tablet Place 1 tablet (0.4 mg total) under the tongue every 5 (five) minutes as needed for chest pain. 25 tablet 3  . Olopatadine HCl 0.2 % SOLN Apply 1 drop to eye daily. 2.5 mL 0  . omeprazole (PRILOSEC) 40 MG capsule Take 1 tablet by mouth twice daily. 60 capsule 2  . pioglitazone (ACTOS) 30 MG tablet Take 1 tablet (30 mg  total) by mouth daily. 90 tablet 3  . potassium chloride SA (K-DUR) 20 MEQ tablet TAKE 1 TABLET(20 MEQ) BY MOUTH TWICE DAILY 180 tablet 0  . sucralfate (CARAFATE) 1 g tablet TAKE 1 TABLET(1 GRAM) BY MOUTH FOUR TIMES DAILY AT BEDTIME WITH MEALS 120 tablet 5  . torsemide (DEMADEX) 10 MG tablet TAKE 2 TABLETS(20 MG) BY MOUTH DAILY 180 tablet 0  . vitamin B-12 (CYANOCOBALAMIN) 250 MCG tablet Take 250 mcg by mouth daily.    . metFORMIN (GLUCOPHAGE-XR) 500 MG 24 hr tablet TAKE 1 TABLET(500 MG) BY MOUTH TWICE DAILY WITH A MEAL 180 tablet 1  . JARDIANCE 25 MG TABS tablet TAKE 1 TABLET BY MOUTH DAILY. NEED APPOINTMENT WITH DOCTOR FOR MORE REFILLS 30 tablet 0  . rosuvastatin (CRESTOR) 40 MG tablet Take 1 tablet (40 mg total) by mouth daily. 90 tablet 1  . sitaGLIPtin ([name]) 100 MG tablet Take 1 tablet (100 mg total) by mouth daily. 30 tablet 3   No facility-administered medications prior to visit.     ROS Review of Systems  Constitutional: Positive for unexpected weight change. Negative for appetite change, diaphoresis and fatigue.  HENT: Negative.   Eyes: Negative for visual disturbance.  Respiratory: Negative for cough, chest tightness, shortness of breath and wheezing.   Cardiovascular:  Negative for chest pain, palpitations and leg swelling.  Gastrointestinal: Negative for abdominal pain, constipation, diarrhea, nausea and vomiting.  Endocrine: Negative for polydipsia, polyphagia and polyuria.  Genitourinary: Negative.  Negative for difficulty urinating and dysuria.  Musculoskeletal: Negative for arthralgias and myalgias.  Skin: Negative.   Neurological: Negative.  Negative for dizziness, weakness and light-headedness.  Hematological: Negative for adenopathy. Does not bruise/bleed easily.  Psychiatric/Behavioral: Negative.     Objective:  BP (!) 144/90 (BP Location: Left Arm, Patient Position: Sitting, Cuff Size: Large)   Pulse 78   Temp 98.7 F (37.1 C) (Oral)   Resp 16   Ht 6\' 3"   (1.905 m)   Wt 291 lb 4 oz (132.1 kg)   SpO2 98%   BMI 36.40 kg/m   BP Readings from Last 3 Encounters:  02/20/19 (!) 144/90  10/27/18 124/84  08/08/18 102/70    Wt Readings from Last 3 Encounters:  02/20/19 291 lb 4 oz (132.1 kg)  10/27/18 287 lb 8 oz (130.4 kg)  08/08/18 271 lb 6.4 oz (123.1 kg)    Physical Exam Vitals signs reviewed.  Constitutional:      Appearance: He is obese. He is not ill-appearing or diaphoretic.  HENT:     Nose: Nose normal.     Mouth/Throat:     Mouth: Mucous membranes are moist.  Eyes:     General: No scleral icterus.    Conjunctiva/sclera: Conjunctivae normal.  Neck:     Musculoskeletal: Normal range of motion and neck supple.  Cardiovascular:     Rate and Rhythm: Normal rate and regular rhythm.     Heart sounds: No murmur.  Pulmonary:     Effort: Pulmonary effort is normal.     Breath sounds: No stridor. No wheezing, rhonchi or rales.  Abdominal:     General: Abdomen is protuberant. Bowel sounds are normal. There is no distension.     Palpations: Abdomen is soft. There is no hepatomegaly or splenomegaly.     Tenderness: There is no abdominal tenderness.  Musculoskeletal: Normal range of motion.     Right lower leg: No edema.     Left lower leg: No edema.  Lymphadenopathy:     Cervical: No cervical adenopathy.  Skin:    General: Skin is warm and dry.     Coloration: Skin is not pale.  Neurological:     General: No focal deficit present.     Mental Status: He is alert.  Psychiatric:        Mood and Affect: Mood normal.        Behavior: Behavior normal.     Lab Results  Component Value Date   WBC 5.9 06/14/2018   HGB 16.0 06/14/2018   HCT 47.2 06/14/2018   PLT 281.0 06/14/2018   GLUCOSE 143 (H) 02/20/2019   CHOL 166 06/14/2018   TRIG 159.0 (H) 06/14/2018   HDL 46.10 06/14/2018   LDLCALC 88 06/14/2018   ALT 24 02/20/2019   AST 20 02/20/2019   NA 137 02/20/2019   K 4.0 02/20/2019   CL 101 02/20/2019   CREATININE 0.86  02/20/2019   BUN 12 02/20/2019   CO2 29 02/20/2019   TSH 0.62 06/14/2018   PSA 1.57 06/14/2018   INR 0.89 05/24/2016   HGBA1C 8.2 (A) 02/20/2019   MICROALBUR <0.7 06/14/2018    Nm Hepato W/eject Fract  Result Date: 09/20/2017 CLINICAL DATA:  Right upper quadrant pain with nausea and vomiting EXAM: NUCLEAR MEDICINE HEPATOBILIARY IMAGING TECHNIQUE: Sequential images  of the abdomen were obtained out to 60 minutes following intravenous administration of radiopharmaceutical. RADIOPHARMACEUTICALS:  5.3 mCi Tc-49m  Choletec IV COMPARISON:  Ultrasound from 09/19/2017 FINDINGS: There is prompt uptake of biliary tracer throughout the liver. Visualization of the biliary tree with opacification of the small bowel is noted. No significant gallbladder opacification is noted. 5.3 mg of morphine was then administered intravenously and repeat imaging was obtained. On significant delayed images following morphine administration there was filling within the gallbladder. IMPRESSION: Patent cystic duct although the gallbladder fills in a somewhat delayed fashion likely related consistent with abnormal gallbladder function. This may represent some chronic cholecystitis. No biliary obstruction is seen. Electronically Signed   By: Alcide Clever M.D.   On: 09/20/2017 15:39   Dg Chest Port 1 View  Result Date: 09/19/2017 CLINICAL DATA:  Shortness of breath. Heart murmur. Coronary artery disease. EXAM: PORTABLE CHEST 1 VIEW COMPARISON:  09/15/2017 FINDINGS: The heart size and mediastinal contours are within normal limits. Both lungs are clear. The visualized skeletal structures are unremarkable. IMPRESSION: No active disease. Electronically Signed   By: Myles Rosenthal M.D.   On: 09/19/2017 11:05   US Abdomen Limited Ruq  Result Date: 09/19/2017 CLINICAL DATA:  Abdominal pain since Wednesday, pancreatitis. EXAM: ULTRASOUND ABDOMEN LIMITED RIGHT UPPER QUADRANT COMPARISON:  None. FINDINGS: Gallbladder: Small layering gallstones.  No gallbladder wall thickening, pericholecystic fluid, sonographic Murphy's sign or other secondary signs of acute cholecystitis. Common bile duct: Diameter: 4 mm Liver: No focal lesion identified. Diffusely echogenic parenchyma indicating fatty infiltration. Portal vein is patent on color Doppler imaging with normal direction of blood flow towards the liver. IMPRESSION: 1. Cholelithiasis without evidence of acute cholecystitis. 2. No bile duct dilatation. 3. Fatty infiltration of the liver. Electronically Signed   By: Bary Richard M.D.   On: 09/19/2017 14:16    Assessment & Plan:   Gaudencio was seen today for hypertension and diabetes.  Diagnoses and all orders for this visit:  Hepatic steatosis- His liver enzymes are normal now.  He agrees to work on his lifestyle modifications and to continue taking pioglitazone. -     Hepatic function panel; Future  Diabetes mellitus type 2 in obese (HCC)- His A1c is up to 8.2%.  I have asked him to increase the dose of metformin and to restart an SGLT2 inhibitor.  I gave him samples of Synjardy.  I have asked him to stay on the current dose of pioglitazone. -     POCT glycosylated hemoglobin (Hb A1C) -     Empagliflozin-metFORMIN HCl ER (SYNJARDY XR) 12.10-998 MG TB24; Take 2 tablets by mouth daily. -     Basic metabolic panel; Future  Hyperlipidemia with target LDL less than 70- He is doing well on the statin and has achieved his LDL goal. -     rosuvastatin (CRESTOR) 40 MG tablet; Take 1 tablet (40 mg total) by mouth daily. -     Hepatic function panel; Future  Coronary artery disease involving native coronary artery of native heart without angina pectoris- He has had no recent episodes of angina.  Will continue to work on risk factor modifications. -     rosuvastatin (CRESTOR) 40 MG tablet; Take 1 tablet (40 mg total) by mouth daily.  Need for immunization against influenza -     Flu Vaccine QUAD 36+ mos IM   I have discontinued Dyon T.  Lynne's sitaGLIPtin, metFORMIN, and Jardiance. I am also having him start on Synjardy XR. Additionally, I am  having him maintain his aspirin EC, cholecalciferol, folic acid, allopurinol, nitroGLYCERIN, albuterol, vitamin B-12, levETIRAcetam, colchicine, anastrozole, pioglitazone, Olopatadine HCl, glucose blood, sucralfate, omeprazole, carvedilol, torsemide, potassium chloride SA, irbesartan-hydrochlorothiazide, and rosuvastatin.  Meds ordered this encounter  Medications  . rosuvastatin (CRESTOR) 40 MG tablet    Sig: Take 1 tablet (40 mg total) by mouth daily.    Dispense:  90 tablet    Refill:  1  . Empagliflozin-metFORMIN HCl ER (SYNJARDY XR) 12.10-998 MG TB24    Sig: Take 2 tablets by mouth daily.    Dispense:  140 tablet    Refill:  0     Follow-up: Return in about 4 months (around 06/22/2019).  Scarlette Calico, MD

## 2019-02-21 ENCOUNTER — Encounter: Payer: Self-pay | Admitting: Internal Medicine

## 2019-02-21 LAB — BASIC METABOLIC PANEL
BUN: 12 mg/dL (ref 6–23)
CO2: 29 mEq/L (ref 19–32)
Calcium: 9.5 mg/dL (ref 8.4–10.5)
Chloride: 101 mEq/L (ref 96–112)
Creatinine, Ser: 0.86 mg/dL (ref 0.40–1.50)
GFR: 115.74 mL/min (ref >60.00)
Glucose, Bld: 143 mg/dL — ABNORMAL HIGH (ref 70–99)
Potassium: 4 mEq/L (ref 3.5–5.1)
Sodium: 137 mEq/L (ref 135–145)

## 2019-02-21 LAB — HEPATIC FUNCTION PANEL
ALT: 24 U/L (ref 0–53)
AST: 20 U/L (ref 0–37)
Albumin: 4 g/dL (ref 3.5–5.2)
Alkaline Phosphatase: 91 U/L (ref 39–117)
Bilirubin, Direct: 0 mg/dL (ref 0.0–0.3)
Total Bilirubin: 0.3 mg/dL (ref 0.2–1.2)
Total Protein: 7.3 g/dL (ref 6.0–8.3)

## 2019-02-21 NOTE — Assessment & Plan Note (Signed)
He has not achieved his blood pressure goal of less than 130/80.  He will restart the SGLT2 inhibitor and I anticipate that this will help him achieve his blood pressure goal.

## 2019-02-22 ENCOUNTER — Other Ambulatory Visit: Payer: Self-pay

## 2019-02-22 ENCOUNTER — Telehealth: Payer: Self-pay | Admitting: Endocrinology

## 2019-02-22 NOTE — Telephone Encounter (Signed)
Needs f/u ov next available. 

## 2019-02-22 NOTE — Telephone Encounter (Signed)
Please advise 

## 2019-02-22 NOTE — Telephone Encounter (Signed)
Patient is scheduled for 02/23/19 at 8:15 a.m.

## 2019-02-22 NOTE — Telephone Encounter (Signed)
Please refer to Dr. Ellison's request below 

## 2019-02-22 NOTE — Telephone Encounter (Signed)
Patient has called to inform that when he was at [place] office yesterday his blood sugars where running high and Dr.Jones changed his medication. Patient is wanting Dr.Ellison to further examine the labs he had done and let him know if he needs to see him.  Please Advise, Thanks

## 2019-02-23 ENCOUNTER — Ambulatory Visit (INDEPENDENT_AMBULATORY_CARE_PROVIDER_SITE_OTHER): Payer: Medicare Other | Admitting: Endocrinology

## 2019-02-23 ENCOUNTER — Encounter: Payer: Self-pay | Admitting: Endocrinology

## 2019-02-23 VITALS — BP 118/74 | HR 95 | Ht 75.0 in | Wt 287.2 lb

## 2019-02-23 DIAGNOSIS — E1169 Type 2 diabetes mellitus with other specified complication: Secondary | ICD-10-CM

## 2019-02-23 DIAGNOSIS — E669 Obesity, unspecified: Secondary | ICD-10-CM | POA: Diagnosis not present

## 2019-02-23 MED ORDER — REPAGLINIDE 0.5 MG PO TABS: 0.5000 mg | ORAL_TABLET | Freq: Three times a day (TID) | ORAL | 11 refills | Status: DC

## 2019-02-23 NOTE — Patient Instructions (Signed)
I have sent a prescription to your pharmacy, to add "repaglinide." Please continue the same other diabetes medications. check your blood sugar once a day.  vary the time of day when you check, between before the 3 meals, and at bedtime.  also check if you have symptoms of your blood sugar being too high or too low.  please keep a record of the readings and bring it to your next appointment here (or you can bring the meter itself).  You can write it on any piece of paper.  please call us sooner if your blood sugar goes below 70, or if you have a lot of readings over 200. Please come back for a follow-up appointment in 2 months.   

## 2019-02-23 NOTE — Progress Notes (Signed)
Subjective:    Patient ID: Xavier MedicoCarlos T [name], male    DOB: 07/30/1970, 48 y.o.   MRN: [id number]  HPI Pt returns for f/u of diabetes mellitus:  DM type: 2 Dx'ed: 2008 Complications: CAD Therapy: 3 oral meds.   DKA: never Severe hypoglycemia: never.  Pancreatitis: 2019 (GB) Pancreatic imaging: 2011 CT: mild fatty replacement in the head and uncinate process.   Other: he declines name brand meds, due to cost Interval history: he takes meds as rx'ed.  No recent steroids.  He says cbg's are in the low to mid-100's.  He takes Synjardy samples.   Past Medical History:  Diagnosis Date  . Anginal pain (HCC)   . Arthritis    ? 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 Followed by Dr. Nickola MajorHawkes  . Arthritis    "ankles" (04/13/2016)  . Asthma   . CORONARY ARTERY DISEASE    a. cath 05/2014: mild LAD disease, normal LCx and RCA. Risk factor modification recommended.  Marland Kitchen. DIABETES MELLITUS, TYPE II   . Edema   . ERECTILE DYSFUNCTION, ORGANIC   . GERD   . Gout, unspecified   . Heart murmur   . HYPERLIPIDEMIA   . HYPERTENSION   . 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 Pericarditis    recurrent  . POLYNEUROPATHY   . PULMONARY SARCOIDOSIS    Mediastinal lymphadenopathy with biospy proven sarcodosis  . Seizures (HCC)    "none in 4-5 years; don't know what kind; not related to alcohol" (04/13/2016)  . TESTICULAR HYPOFUNCTION   . TIA (transient ischemic attack)    "I don't remember when" (04/13/2016)    Past Surgical History:  Procedure Laterality Date  . BRONCHOSCOPY  08/21/08  . CARDIAC CATHETERIZATION  06/25/2009   minimal disease  . CARDIAC CATHETERIZATION N/A 04/14/2016   Procedure: Left Heart Cath and Coronary Angiography;  Surgeon: Kathleene Hazelhristopher D McAlhany, MD;  [facility];  Service: Cardiovascular;  Laterality: N/A;  . CHOLECYSTECTOMY N/A 09/21/2017   Procedure: LAPAROSCOPIC CHOLECYSTECTOMY WITH INTRAOPERATIVE CHOLANGIOGRAM;   Surgeon: Glenna FellowsHoxworth, Benjamin, MD;  Location: [facility] OR[facility];  Service: General;  Laterality: N/A;  . LEFT HEART CATHETERIZATION WITH CORONARY ANGIOGRAM N/A 05/18/2014   Procedure: LEFT HEART CATHETERIZATION WITH CORONARY ANGIOGRAM;  Surgeon: Corky CraftsJayadeep S Varanasi, MD;  [facility];  Service: Cardiovascular;  Laterality: N/A;  . MEDIASTINOSCOPY  11/30/08    Social History   Socioeconomic History  . Marital status: Significant Other    Spouse name: Not on file  . Number of children: 3  . Years of education: Not on file  . Highest education level: Not on file  Occupational History  . Occupation: Unemployed  Social Needs  . Financial resource strain: Not on file  . Food insecurity    Worry: Not on file    Inability: Not on file  . Transportation needs    Medical: Not on file    Non-medical: Not on file  Tobacco Use  . Smoking status: Former Smoker    Packs/day: 1.00    Years: 10.00    Pack years: 10.00    Types: Cigarettes    Quit date: 05/18/2014    Years since quitting: 4.7  . Smokeless tobacco: Never Used  Substance and Sexual Activity  . Alcohol use: Yes    Alcohol/week: 0.0 standard drinks    Comment: 04/13/2016 "glass of wine a couple times/month"  . Drug use: No  . Sexual activity: Yes  Lifestyle  .  Physical activity    Days per week: Not on file    Minutes per session: Not on file  . Stress: Not on file  Relationships  . Social Musician on phone: Not on file    Gets together: Not on file    Attends religious service: Not on file    Active member of club or organization: Not on file    Attends meetings of clubs or organizations: Not on file    Relationship status: Not on file  . Intimate partner violence    Fear of current or ex partner: Not on file    Emotionally abused: Not on file    Physically abused: Not on file    Forced sexual activity: Not on file  Other Topics Concern  . Not on file  Social History Narrative   Married, lives with wife  and 3 kids.    Currently student. 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 albuterol (VENTOLIN HFA) 108 (90 Base) MCG/ACT inhaler Inhale 2 puffs into the lungs every 6 (six) hours as needed for wheezing or shortness of breath. 3 Inhaler 1  . allopurinol (ZYLOPRIM) 300 MG tablet TAKE 1 TABLET(300 MG) BY MOUTH DAILY 90 tablet 0  . anastrozole (ARIMIDEX) 1 MG tablet Take 1 mg by mouth daily.    Marland Kitchen aspirin EC 81 MG tablet Take 1 tablet (81 mg total) by mouth daily. 30 tablet 3  . carvedilol (COREG) 3.125 MG tablet TAKE 1 TABLET(3.125 MG) BY MOUTH TWICE DAILY WITH A MEAL 180 tablet 0  . cholecalciferol (VITAMIN D) 1000 units tablet Take 1,000 Units by mouth daily.    . colchicine 0.6 MG tablet Take 0.6 mg by mouth every 4 (four) hours as needed (gout pain).    . Empagliflozin-metFORMIN HCl ER (SYNJARDY XR) 12.10-998 MG TB24 Take 2 tablets by mouth daily. 140 tablet 0  . folic acid (FOLVITE) 1 MG tablet TK 1 T PO QD  1  . glucose blood (ONETOUCH VERIO) test strip 1 each by Other route daily. And lancets 1/day (Patient taking differently: 1 each by Other route daily. ) 100 each 11  . irbesartan-hydrochlorothiazide (AVALIDE) 300-12.5 MG tablet Take 1 tablet by mouth daily. 90 tablet 0  . levETIRAcetam (KEPPRA) 1000 MG tablet Take 1,000 mg by mouth 2 (two) times daily.    . nitroGLYCERIN (NITROSTAT) 0.4 MG SL tablet Place 1 tablet (0.4 mg total) under the tongue every 5 (five) minutes as needed for chest pain. 25 tablet 3  . Olopatadine HCl 0.2 % SOLN Apply 1 drop to eye daily. 2.5 mL 0  . omeprazole (PRILOSEC) 40 MG capsule Take 1 tablet by mouth twice daily. 60 capsule 2  . pioglitazone (ACTOS) 30 MG tablet Take 1 tablet (30 mg total) by mouth daily. 90 tablet 3  . potassium chloride SA (K-DUR) 20 MEQ tablet TAKE 1 TABLET(20 MEQ) BY MOUTH TWICE DAILY 180 tablet 0  . rosuvastatin (CRESTOR) 40 MG tablet  Take 1 tablet (40 mg total) by mouth daily. 90 tablet 1  . sucralfate (CARAFATE) 1 g tablet TAKE 1 TABLET(1 GRAM) BY MOUTH FOUR TIMES DAILY AT BEDTIME WITH MEALS 120 tablet 5  . torsemide (DEMADEX) 10 MG tablet TAKE 2 TABLETS(20 MG) BY MOUTH DAILY 180 tablet 0  . vitamin B-12 (CYANOCOBALAMIN) 250 MCG tablet Take 250 mcg by mouth daily.  No current facility-administered medications on file prior to visit.     Allergies  Allergen Reactions  . Amlodipine Other (See Comments)    Ankle swelling with 10mg , ok with 5mg  dose  . Sulfamethoxazole Rash    fever  . Sulfonamide Derivatives Rash and Other (See Comments)    Family History  Problem Relation Age of Onset  . Diabetes Mother   . Hypertension Mother   . Diabetes Father   . Heart disease Father 67       Fatal MI  . Heart failure Father     BP 118/74 (BP Location: Left Arm, Patient Position: Sitting, Cuff Size: Large)   Pulse 95   Ht 6\' 3"  (1.905 m)   Wt 287 lb 3.2 oz (130.3 kg)   SpO2 97%   BMI 35.90 kg/m   Review of Systems He denies hypoglycemia.      Objective:   Physical Exam VITAL SIGNS:  See vs page GENERAL: no distress Pulses: dorsalis pedis intact bilat.   MSK: no deformity of the feet CV: no leg edema Skin:  no ulcer on the feet.  normal color and temp on the feet. Neuro: sensation is intact to touch on the feet.    Lab Results  Component Value Date   CREATININE 0.86 02/20/2019   BUN 12 02/20/2019   NA 137 02/20/2019   K 4.0 02/20/2019   CL 101 02/20/2019   CO2 29 02/20/2019   Lab Results  Component Value Date   HGBA1C 8.2 (A) 02/20/2019      Assessment & Plan:  Type 2 DM, with CAD: worse.   Patient Instructions  I have sent a prescription to your pharmacy, to add "repaglinide." Please continue the same other diabetes medications. check your blood sugar once a day.  vary the time of day when you check, between before the 3 meals, and at bedtime.  also check if you have symptoms of your blood  sugar being too high or too low.  please keep a record of the readings and bring it to your next appointment here (or you can bring the meter itself).  You can write it on any piece of paper.  please call us sooner if your blood sugar goes below 70, or if you have a lot of readings over 200. Please come back for a follow-up appointment in 2 months.

## 2019-03-07 ENCOUNTER — Telehealth: Payer: Self-pay | Admitting: General Surgery

## 2019-03-07 MED ORDER — OMEPRAZOLE 40 MG PO CPDR: DELAYED_RELEASE_CAPSULE | ORAL | 0 refills | Status: DC

## 2019-03-07 NOTE — Telephone Encounter (Signed)
Refill

## 2019-04-03 DIAGNOSIS — M10052 Idiopathic gout, left hip: Secondary | ICD-10-CM | POA: Diagnosis not present

## 2019-04-03 DIAGNOSIS — D860 Sarcoidosis of lung: Secondary | ICD-10-CM | POA: Diagnosis not present

## 2019-04-03 DIAGNOSIS — M109 Gout, unspecified: Secondary | ICD-10-CM | POA: Diagnosis not present

## 2019-04-03 DIAGNOSIS — I158 Other secondary hypertension: Secondary | ICD-10-CM | POA: Diagnosis not present

## 2019-04-12 ENCOUNTER — Other Ambulatory Visit: Payer: Self-pay | Admitting: Internal Medicine

## 2019-04-12 DIAGNOSIS — I10 Essential (primary) hypertension: Secondary | ICD-10-CM

## 2019-04-12 MED ORDER — POTASSIUM CHLORIDE CRYS ER 20 MEQ PO TBCR: EXTENDED_RELEASE_TABLET | ORAL | 0 refills | Status: DC

## 2019-04-20 ENCOUNTER — Other Ambulatory Visit: Payer: Self-pay | Admitting: Internal Medicine

## 2019-04-20 ENCOUNTER — Other Ambulatory Visit: Payer: Self-pay

## 2019-04-20 DIAGNOSIS — K2100 Gastro-esophageal reflux disease with esophagitis, without bleeding: Secondary | ICD-10-CM

## 2019-04-20 MED ORDER — OMEPRAZOLE 40 MG PO CPDR: DELAYED_RELEASE_CAPSULE | ORAL | 0 refills | Status: DC

## 2019-04-24 ENCOUNTER — Ambulatory Visit: Payer: Medicare Other | Admitting: Endocrinology

## 2019-04-24 ENCOUNTER — Other Ambulatory Visit: Payer: Self-pay

## 2019-04-24 ENCOUNTER — Encounter: Payer: Self-pay | Admitting: Endocrinology

## 2019-04-24 VITALS — BP 118/82 | HR 98 | Ht 75.0 in | Wt 282.2 lb

## 2019-04-24 DIAGNOSIS — E1169 Type 2 diabetes mellitus with other specified complication: Secondary | ICD-10-CM | POA: Diagnosis not present

## 2019-04-24 DIAGNOSIS — E669 Obesity, unspecified: Secondary | ICD-10-CM

## 2019-04-24 LAB — POCT GLYCOSYLATED HEMOGLOBIN (HGB A1C): Hemoglobin A1C: 6.4 % — AB (ref 4.0–5.6)

## 2019-04-24 NOTE — Progress Notes (Signed)
Subjective:    Patient ID: [name], male    DOB: Jun 10, 1970, 48 y.o.   MRN: [id number]  HPI Pt returns for f/u of diabetes mellitus:  DM type: 2 Dx'ed: 5631 Complications: CAD Therapy: 4 oral meds.   DKA: never Severe hypoglycemia: never.  Pancreatitis: 2019 ([facility]) Pancreatic imaging: 2011 CT: mild fatty replacement in the head and uncinate process.   Other: he declines name brand meds, due to cost; he takes Synjardy samples.   Interval history: he takes meds as rx'ed.  No recent steroids.  He says cbg's vary from 94-195. It is in general higher as the day goes on.  Past Medical History:  Diagnosis Date  . Anginal pain ([place])   . Arthritis    ? 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 Followed by Dr. Trudie Reed  . Arthritis    "ankles" (04/13/2016)  . Asthma   . CORONARY ARTERY DISEASE    a. cath 05/2014: mild LAD disease, normal LCx and RCA. Risk factor modification recommended.  Marland Kitchen DIABETES MELLITUS, TYPE II   . Edema   . ERECTILE DYSFUNCTION, ORGANIC   . GERD   . Gout, unspecified   . Heart murmur   . HYPERLIPIDEMIA   . HYPERTENSION   . 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 Pericarditis    recurrent  . POLYNEUROPATHY   . PULMONARY SARCOIDOSIS    Mediastinal lymphadenopathy with biospy proven sarcodosis  . Seizures ([place])    "none in 4-5 years; don't know what kind; not related to alcohol" (04/13/2016)  . TESTICULAR HYPOFUNCTION   . TIA (transient ischemic attack)    "I don't remember when" (04/13/2016)    Past Surgical History:  Procedure Laterality Date  . BRONCHOSCOPY  08/21/08  . CARDIAC CATHETERIZATION  06/25/2009   minimal disease  . CARDIAC CATHETERIZATION N/A 04/14/2016   Procedure: Left Heart Cath and Coronary Angiography;  Surgeon: Burnell Blanks, MD;  Location: [place] [facility];  Service: Cardiovascular;  Laterality: N/A;  . CHOLECYSTECTOMY N/A 09/21/2017   Procedure: LAPAROSCOPIC CHOLECYSTECTOMY WITH  INTRAOPERATIVE CHOLANGIOGRAM;  Surgeon: Excell Seltzer, MD;  Location: [facility];  Service: General;  Laterality: N/A;  . LEFT HEART CATHETERIZATION WITH CORONARY ANGIOGRAM N/A 05/18/2014   Procedure: LEFT HEART CATHETERIZATION WITH CORONARY ANGIOGRAM;  Surgeon: Jettie Booze, MD;  Location: [facility] C[facility];  Service: Cardiovascular;  Laterality: N/A;  . MEDIASTINOSCOPY  11/30/08    Social History   Socioeconomic History  . Marital status: Significant Other    Spouse name: Not on file  . Number of children: 3  . Years of education: Not on file  . Highest education level: Not on file  Occupational History  . Occupation: Unemployed  Social Needs  . Financial resource strain: Not on file  . Food insecurity    Worry: Not on file    Inability: Not on file  . Transportation needs    Medical: Not on file    Non-medical: Not on file  Tobacco Use  . Smoking status: Former Smoker    Packs/day: 1.00    Years: 10.00    Pack years: 10.00    Types: Cigarettes    Quit date: 05/18/2014    Years since quitting: 4.9  . Smokeless tobacco: Never Used  Substance and Sexual Activity  . Alcohol use: Yes    Alcohol/week: 0.0 standard drinks    Comment: 04/13/2016 "glass of wine a couple times/month"  . Drug use: No  .  Sexual activity: Yes  Lifestyle  . Physical activity    Days per week: Not on file    Minutes per session: Not on file  . Stress: Not on file  Relationships  . Social Musician on phone: Not on file    Gets together: Not on file    Attends religious service: Not on file    Active member of club or organization: Not on file    Attends meetings of clubs or organizations: Not on file    Relationship status: Not on file  . Intimate partner violence    Fear of current or ex partner: Not on file    Emotionally abused: Not on file    Physically abused: Not on file    Forced sexual activity: Not on file  Other Topics Concern  . Not on file  Social History  Narrative   Married, lives with wife and 3 kids.    Currently student. 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 albuterol (VENTOLIN HFA) 108 (90 Base) MCG/ACT inhaler Inhale 2 puffs into the lungs every 6 (six) hours as needed for wheezing or shortness of breath. 3 Inhaler 1  . allopurinol (ZYLOPRIM) 300 MG tablet TAKE 1 TABLET(300 MG) BY MOUTH DAILY 90 tablet 0  . anastrozole (ARIMIDEX) 1 MG tablet Take 1 mg by mouth daily.    Marland Kitchen aspirin EC 81 MG tablet Take 1 tablet (81 mg total) by mouth daily. 30 tablet 3  . carvedilol (COREG) 3.125 MG tablet TAKE 1 TABLET(3.125 MG) BY MOUTH TWICE DAILY WITH A MEAL 180 tablet 0  . cholecalciferol (VITAMIN D) 1000 units tablet Take 1,000 Units by mouth daily.    . colchicine 0.6 MG tablet Take 0.6 mg by mouth every 4 (four) hours as needed (gout pain).    . Empagliflozin-metFORMIN HCl ER (SYNJARDY XR) 12.10-998 MG TB24 Take 2 tablets by mouth daily. 140 tablet 0  . folic acid (FOLVITE) 1 MG tablet TK 1 T PO QD  1  . glucose blood (ONETOUCH VERIO) test strip 1 each by Other route daily. And lancets 1/day (Patient taking differently: 1 each by Other route daily. ) 100 each 11  . irbesartan-hydrochlorothiazide (AVALIDE) 300-12.5 MG tablet Take 1 tablet by mouth daily. 90 tablet 0  . levETIRAcetam (KEPPRA) 1000 MG tablet Take 1,000 mg by mouth 2 (two) times daily.    . nitroGLYCERIN (NITROSTAT) 0.4 MG SL tablet Place 1 tablet (0.4 mg total) under the tongue every 5 (five) minutes as needed for chest pain. 25 tablet 3  . Olopatadine HCl 0.2 % SOLN Apply 1 drop to eye daily. 2.5 mL 0  . omeprazole (PRILOSEC) 40 MG capsule Take 1 tablet by mouth twice daily. 180 capsule 0  . pioglitazone (ACTOS) 30 MG tablet Take 1 tablet (30 mg total) by mouth daily. 90 tablet 3  . potassium chloride SA (KLOR-CON) 20 MEQ tablet TAKE 1 TABLET(20 MEQ) BY MOUTH TWICE DAILY 180 tablet 0   . repaglinide (PRANDIN) 0.5 MG tablet Take 1 tablet (0.5 mg total) by mouth 3 (three) times daily before meals. 90 tablet 11  . rosuvastatin (CRESTOR) 40 MG tablet Take 1 tablet (40 mg total) by mouth daily. 90 tablet 1  . sucralfate (CARAFATE) 1 g tablet TAKE 1 TABLET(1 GRAM) BY MOUTH FOUR TIMES DAILY AT BEDTIME WITH MEALS 120 tablet 5  .  torsemide (DEMADEX) 10 MG tablet TAKE 2 TABLETS(20 MG) BY MOUTH DAILY 180 tablet 0  . vitamin B-12 (CYANOCOBALAMIN) 250 MCG tablet Take 250 mcg by mouth daily.     No current facility-administered medications on file prior to visit.     Allergies  Allergen Reactions  . Amlodipine Other (See Comments)    Ankle swelling with 10mg , ok with 5mg  dose  . Sulfamethoxazole Rash    fever  . Sulfonamide Derivatives Rash and Other (See Comments)    Family History  Problem Relation Age of Onset  . Diabetes Mother   . Hypertension Mother   . Diabetes Father   . Heart disease Father 5546       Fatal MI  . Heart failure Father    BP 118/82 (BP Location: Left Arm, Patient Position: Sitting, Cuff Size: Large)   Pulse 98   Ht 6\' 3"  (1.905 m)   Wt 282 lb 3.2 oz (128 kg)   SpO2 96%   BMI 35.27 kg/m   Review of Systems No hypoglycemia.      Objective:   Physical Exam VITAL SIGNS:  See vs page GENERAL: no distress Pulses: dorsalis pedis intact bilat.   MSK: no deformity of the feet CV: no leg edema Skin:  no ulcer on the feet.  normal color and temp on the feet. Neuro: sensation is intact to touch on the feet  Lab Results  Component Value Date   HGBA1C 6.4 (A) 04/24/2019   Lab Results  Component Value Date   CREATININE 0.86 02/20/2019   BUN 12 02/20/2019   NA 137 02/20/2019   K 4.0 02/20/2019   CL 101 02/20/2019   CO2 29 02/20/2019       Assessment & Plan:  Type 2 DM: well-controlled.  CAD: in this setting, he needs meds which avoid hypoglycemia.   Patient Instructions  Please continue the same diabetes medications. check your blood  sugar once a day.  vary the time of day when you check, between before the 3 meals, and at bedtime.  also check if you have symptoms of your blood sugar being too high or too low.  please keep a record of the readings and bring it to your next appointment here (or you can bring the meter itself).  You can write it on any piece of paper.  please call us sooner if your blood sugar goes below 70, or if you have a lot of readings over 200. Please come back for a follow-up appointment in 4 months.

## 2019-04-24 NOTE — Patient Instructions (Addendum)
Please continue the same diabetes medications.   check your blood sugar once a day.  vary the time of day when you check, between before the 3 meals, and at bedtime.  also check if you have symptoms of your blood sugar being too high or too low.  please keep a record of the readings and bring it to your next appointment here (or you can bring the meter itself).  You can write it on any piece of paper.  please call us sooner if your blood sugar goes below 70, or if you have a lot of readings over 200.   Please come back for a follow-up appointment in 4 months.   

## 2019-04-26 ENCOUNTER — Other Ambulatory Visit: Payer: Self-pay

## 2019-04-26 DIAGNOSIS — E669 Obesity, unspecified: Secondary | ICD-10-CM

## 2019-04-26 DIAGNOSIS — E1169 Type 2 diabetes mellitus with other specified complication: Secondary | ICD-10-CM

## 2019-04-26 MED ORDER — PIOGLITAZONE HCL 30 MG PO TABS: 30.0000 mg | ORAL_TABLET | Freq: Every day | ORAL | 3 refills | Status: DC

## 2019-05-12 DIAGNOSIS — E291 Testicular hypofunction: Secondary | ICD-10-CM | POA: Diagnosis not present

## 2019-05-15 ENCOUNTER — Other Ambulatory Visit: Payer: Self-pay | Admitting: Internal Medicine

## 2019-05-15 DIAGNOSIS — E1169 Type 2 diabetes mellitus with other specified complication: Secondary | ICD-10-CM

## 2019-05-15 DIAGNOSIS — I10 Essential (primary) hypertension: Secondary | ICD-10-CM

## 2019-05-15 MED ORDER — IRBESARTAN-HYDROCHLOROTHIAZIDE 300-12.5 MG PO TABS: 1.0000 | ORAL_TABLET | Freq: Every day | ORAL | 0 refills | Status: DC

## 2019-05-22 ENCOUNTER — Other Ambulatory Visit: Payer: Self-pay | Admitting: Cardiology

## 2019-05-30 ENCOUNTER — Telehealth: Payer: Self-pay

## 2019-05-30 ENCOUNTER — Other Ambulatory Visit: Payer: Self-pay

## 2019-05-30 MED ORDER — TORSEMIDE 20 MG PO TABS: 20.0000 mg | ORAL_TABLET | Freq: Two times a day (BID) | ORAL | 0 refills | Status: DC

## 2019-05-30 NOTE — Telephone Encounter (Signed)
**Note De-Identified Darnel Mchan Obfuscation** I changed he pts Torsemide 10 mg take 2 tablets BIS to 20 mg take 1 tablet BID in hopes that the pts insurance would cover but Torsemide is a non-formulary medication on the pts plan.  I started a Non-formulary Torsemide PA through covermymeds. Key: [id number]

## 2019-05-31 NOTE — Telephone Encounter (Signed)
I s/w the pt and informed him that the Torsemide has been approved and Rx was sent in yesterday. Pt thanked me for the call. I will route this message to both our Prior [place] as well as Dr. Francesca Oman nurse Karlene Einstein as Juluis Rainier.

## 2019-05-31 NOTE — Telephone Encounter (Signed)
[facility] called and states that the prior [name] has been approved. If any further information is needed, please refrence the case using the same key

## 2019-06-13 DIAGNOSIS — R350 Frequency of micturition: Secondary | ICD-10-CM | POA: Diagnosis not present

## 2019-06-13 DIAGNOSIS — N401 Enlarged prostate with lower urinary tract symptoms: Secondary | ICD-10-CM | POA: Diagnosis not present

## 2019-06-13 DIAGNOSIS — E291 Testicular hypofunction: Secondary | ICD-10-CM | POA: Diagnosis not present

## 2019-06-19 NOTE — Progress Notes (Signed)
Cardiology Office Note    Date:  06/20/2019   ID:  [name], DOB 1971-01-20, MRN [id number]  PCP:  [name], MD  Cardiologist: Tobias Alexander, MD EPS: None  Chief Complaint  Patient presents with  . Follow-up    History of Present Illness:  [name] is a 49 y.o. male with history of nonobstructive CAD on cath 05/2014, family history of CAD with father having MI at 33, hypertension, HLD, DM type II, RA and pulmonary sarcoidosis.  Holter monitor ordered by PCP 11/2018 for fast heart rates showed normal sinus rhythm, sinus tachycardia rare PACs average heart rate 90 bpm no pathologic arrhythmias.   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 Started walking last week-2 miles twice last week and no chest pain, shortness of breath, dizziness, or edema.Patient has occasional palpitations but not like he was having in June.   Past Medical History:  Diagnosis Date  . Anginal pain ([facility])   . Arthritis    ? 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 Followed by Dr. Nickola Major  . Arthritis    "ankles" (04/13/2016)  . Asthma   . CORONARY ARTERY DISEASE    a. cath 05/2014: mild LAD disease, normal LCx and RCA. Risk factor modification recommended.  Marland Kitchen DIABETES MELLITUS, TYPE II   . Edema   . ERECTILE DYSFUNCTION, ORGANIC   . GERD   . Gout, unspecified   . Heart murmur   . HYPERLIPIDEMIA   . HYPERTENSION   . 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 Pericarditis    recurrent  . POLYNEUROPATHY   . PULMONARY SARCOIDOSIS    Mediastinal lymphadenopathy with biospy proven sarcodosis  . Seizures ([facility])    "none in 4-5 years; don't know what kind; not related to alcohol" (04/13/2016)  . TESTICULAR HYPOFUNCTION   . TIA (transient ischemic attack)    "I don't remember when" (04/13/2016)    Past Surgical History:  Procedure Laterality Date  . BRONCHOSCOPY  08/21/08  . CARDIAC CATHETERIZATION  06/25/2009   minimal disease  . CARDIAC CATHETERIZATION N/A 04/14/2016   Procedure: Left Heart Cath and Coronary Angiography;  Surgeon: Kathleene Hazel, MD;  Location: [facility] INVASIVE [facility] LAB;  Service: Cardiovascular;  Laterality: N/A;  . CHOLECYSTECTOMY N/A 09/21/2017   Procedure: LAPAROSCOPIC CHOLECYSTECTOMY WITH INTRAOPERATIVE CHOLANGIOGRAM;  Surgeon: Glenna Fellows, MD;  Location: [facility];  Service: General;  Laterality: N/A;  . LEFT HEART CATHETERIZATION WITH CORONARY ANGIOGRAM N/A 05/18/2014   Procedure: LEFT HEART CATHETERIZATION WITH CORONARY ANGIOGRAM;  Surgeon: Corky Crafts, MD;  Location: [facility] CATH LAB;  Service: Cardiovascular;  Laterality: N/A;  . MEDIASTINOSCOPY  11/30/08    Current Medications: Current Meds  Medication Sig  . albuterol (VENTOLIN HFA) 108 (90 Base) MCG/ACT inhaler Inhale 2 puffs into the lungs every 6 (six) hours as needed for wheezing or shortness of breath.  . allopurinol (ZYLOPRIM) 300 MG tablet TAKE 1 TABLET(300 MG) BY MOUTH DAILY  . anastrozole (ARIMIDEX) 1 MG tablet Take 1 mg by mouth daily.  Marland Kitchen aspirin EC 81 MG tablet Take 1 tablet (81 mg total) by mouth daily.  . cholecalciferol (VITAMIN D) 1000 units tablet Take 1,000 Units by mouth daily.  . colchicine 0.6 MG tablet Take 0.6 mg by mouth every 4 (four) hours as needed (gout pain).  . Empagliflozin-metFORMIN HCl ER (SYNJARDY XR) 12.10-998 MG TB24 Take 2 tablets by mouth daily.  . folic acid (FOLVITE) 1  MG tablet TK 1 T PO QD  . glucose blood (ONETOUCH VERIO) test strip 1 each by Other route daily. And lancets 1/day (Patient taking differently: 1 each by Other route daily. )  . irbesartan-hydrochlorothiazide (AVALIDE) 300-12.5 MG tablet Take 1 tablet by mouth daily.  . nitroGLYCERIN (NITROSTAT) 0.4 MG SL tablet Place 1 tablet (0.4 mg total) under the tongue every 5 (five) minutes as needed for chest pain.  Marland Kitchen Olopatadine HCl 0.2 % SOLN Apply 1 drop to eye daily.  Marland Kitchen omeprazole (PRILOSEC) 40 MG capsule Take 1  tablet by mouth twice daily.  . pioglitazone (ACTOS) 30 MG tablet Take 1 tablet (30 mg total) by mouth daily.  . potassium chloride SA (KLOR-CON) 20 MEQ tablet TAKE 1 TABLET(20 MEQ) BY MOUTH TWICE DAILY  . repaglinide (PRANDIN) 0.5 MG tablet Take 1 tablet (0.5 mg total) by mouth 3 (three) times daily before meals.  . rosuvastatin (CRESTOR) 40 MG tablet Take 1 tablet (40 mg total) by mouth daily.  . sucralfate (CARAFATE) 1 g tablet TAKE 1 TABLET(1 GRAM) BY MOUTH FOUR TIMES DAILY AT BEDTIME WITH MEALS  . torsemide (DEMADEX) 20 MG tablet Take 1 tablet (20 mg total) by mouth 2 (two) times daily.  . [DISCONTINUED] carvedilol (COREG) 3.125 MG tablet TAKE 1 TABLET(3.125 MG) BY MOUTH TWICE DAILY WITH A MEAL  . [DISCONTINUED] levETIRAcetam (KEPPRA) 1000 MG tablet Take 1,000 mg by mouth 2 (two) times daily.  . [DISCONTINUED] vitamin B-12 (CYANOCOBALAMIN) 250 MCG tablet Take 250 mcg by mouth daily.     Allergies:   Amlodipine, Sulfamethoxazole, and Sulfonamide derivatives   Social History   Socioeconomic History  . Marital status: Significant Other    Spouse name: Not on file  . Number of children: 3  . Years of education: Not on file  . Highest education level: Not on file  Occupational History  . Occupation: Unemployed  Tobacco Use  . Smoking status: Former Smoker    Packs/day: 1.00    Years: 10.00    Pack years: 10.00    Types: Cigarettes    Quit date: 05/18/2014    Years since quitting: 5.0  . Smokeless tobacco: Never Used  Substance and Sexual Activity  . Alcohol use: Yes    Alcohol/week: 0.0 standard drinks    Comment: 04/13/2016 "glass of wine a couple times/month"  . Drug use: No  . Sexual activity: Yes  Other Topics Concern  . Not on file  Social History Narrative   Married, lives with wife and 3 kids.    Currently student. 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 Difficulty of Paying Living  Expenses: Not on file  Food Insecurity:   . Worried About Charity fundraiser in the Last Year: Not on file  . Ran Out of Food in the Last Year: Not on file  Transportation Needs:   . Lack of Transportation (Medical): Not on file  . Lack of Transportation (Non-Medical): Not on file  Physical Activity:   . Days of Exercise per Week: Not on file  . Minutes of Exercise per Session: Not on file  Stress:   . Feeling of Stress : Not on file  Social Connections:   . Frequency of Communication with Friends and Family: Not on file  . Frequency of Social Gatherings with Friends and Family: Not on file  . Attends Religious Services: Not on file  . Active  Member of Clubs or Organizations: Not on file  . Attends Banker Meetings: Not on file  . Marital Status: Not on file     Family History:  The patient's   family history includes Diabetes in his father and mother; Heart disease (age of onset: 84) in his father; Heart failure in his father; Hypertension in his mother.   ROS:   Please see the history of present illness.    ROS All other systems reviewed and are negative.   PHYSICAL EXAM:   VS:  BP 112/72   Pulse 91   Ht 6\' 3"  (1.905 m)   Wt 284 lb (128.8 kg)   SpO2 99%   BMI 35.50 kg/m   Physical Exam  GEN: Well nourished, well developed, in no acute distress  Neck: no JVD, carotid bruits, or masses Cardiac:RRR; no murmurs, rubs, or gallops  Respiratory:  clear to auscultation bilaterally, normal work of breathing GI: soft, nontender, nondistended, + BS Ext: without cyanosis, clubbing, or edema, Good distal pulses bilaterally Neuro:  Alert and Oriented x 3 Psych: euthymic mood, full affect  Wt Readings from Last 3 Encounters:  06/20/19 284 lb (128.8 kg)  04/24/19 282 lb 3.2 oz (128 kg)  02/23/19 287 lb 3.2 oz (130.3 kg)      Studies/Labs Reviewed:   EKG:  EKG is not ordered today.    Recent Labs: 02/20/2019: ALT 24; BUN 12; Creatinine, Ser 0.86; Potassium 4.0;  Sodium 137   Lipid Panel    Component Value Date/Time   CHOL 166 06/14/2018 1556   TRIG 159.0 (H) 06/14/2018 1556   TRIG 90 06/25/2009 0000   HDL 46.10 06/14/2018 1556   CHOLHDL 4 06/14/2018 1556   VLDL 31.8 06/14/2018 1556   LDLCALC 88 06/14/2018 1556    Additional studies/ records that were reviewed today include:   Holter monitor 11/2018  Normal sinus rhtyhm. Sinus tachycardia.  Rare PAC.  Average HR 90 bpm.  No pathologic arrhythmias.    Cardiac Catheterization: 05/2014 HEMODYNAMICS:  Aortic pressure was 105/70; LV pressure was 105/2; LVEDP 16.  There was no gradient between the left ventricle and aorta.     ANGIOGRAPHIC DATA:   The left main coronary artery is widely patent.   The left anterior descending artery is a large vessel which wraps around the apex. There is mild atherosclerosis in the mid vessel. There are 2 large diagonals more proximally. There are widely patent. The third diagonal is medium-sized and patent.   The left circumflex artery is a large vessel. The first obtuse marginal is large and branches across the lateral wall.  The second obtuse marginal is widely patent.   The right coronary artery is a large dominant vessel. There is an early bifurcation of the posterior lateral and posterior descending arteries. Both vessels are widely patent.   LEFT VENTRICULOGRAM:  Left ventricular angiogram was done in the 30 RAO projection and revealed normal left ventricular wall motion and systolic function with an estimated ejection fraction of 60 %.  LVEDP was 16 mmHg.   IMPRESSIONS:   1. Normal left main coronary artery. 2. Mild disease in the left anterior descending artery and its branches. 3. Widely patent left circumflex artery and its branches. 4. Widely patent right coronary artery. 5. Normal left ventricular systolic function.  LVEDP 16 mmHg.  Ejection fraction 60%.       ASSESSMENT:    1. Coronary artery disease involving native coronary  artery of native heart without angina  pectoris   2. Essential hypertension   3. Hyperlipidemia with target LDL less than 70   4. Diabetes mellitus type 2 in obese (HCC)   5. Palpitations      PLAN:  In order of problems listed above:  Nonobstructive CAD on cath in 2015 on aspirin, statin-no chest pain. Increase exercise  Essential hypertension controlled  Hyperlipidemia on Crestor LDL 88 06/14/2018.  Due for repeat.  He'll come tomorrow for FLP.Goal is less than 70  DM type II well controlled managed by Dr. Everardo All A1c 6.4 04/2019  Palpitations back in June, monitor reviewed-no significant arrhythmia but no recent palpitations. He will keep track of HR. Used to be on low dose coreg but inadvertently stopped. Will hold off on restarting as BP controlled and no recent palpitations.  Medication Adjustments/Labs and Tests Ordered: Current medicines are reviewed at length with the patient today.  Concerns regarding medicines are outlined above.  Medication changes, Labs and Tests ordered today are listed in the Patient Instructions below. Patient Instructions  Medication Instructions:  Your physician recommends that you continue on your current medications as directed. Please refer to the Current Medication list given to you today.  *If you need a refill on your cardiac medications before your next appointment, please call your pharmacy*  Lab Work: Your physician recommends that you return for a FASTING lipid profile  If you have labs (blood work) drawn today and your tests are completely normal, you will receive your results only by: Marland Kitchen MyChart Message (if you have MyChart) OR . A paper copy in the mail If you have any lab test that is abnormal or we need to change your treatment, we will call you to review the results.  Testing/Procedures: None ordered  Follow-Up: At [facility], you and your health needs are our priority.  As part of our continuing mission to provide you with  exceptional heart care, we have created designated Provider Care Teams.  These Care Teams include your primary Cardiologist (physician) and Advanced Practice Providers (APPs -  Physician Assistants and Nurse Practitioners) who all work together to provide you with the care you need, when you need it.  Your next appointment:   12 month(s)  The format for your next appointment:   In Person  Provider:   You may see Tobias Alexander, MD or one of the following Advanced Practice Providers on your designated Care Team:    Ronie Spies, PA-C  Jacolyn Reedy, PA-C   Other Instructions  Please continue to monitor your heart rate at rest and with exercise     Signed, Jacolyn Reedy, PA-C  06/20/2019 11:57 AM    [facility] [address] [place], [place], [place]  [address] Phone: [phone]; Fax: [phone]

## 2019-06-20 ENCOUNTER — Ambulatory Visit: Payer: Medicare Other | Admitting: Physician Assistant

## 2019-06-20 ENCOUNTER — Other Ambulatory Visit: Payer: Self-pay

## 2019-06-20 ENCOUNTER — Encounter: Payer: Self-pay | Admitting: Physician Assistant

## 2019-06-20 VITALS — BP 112/72 | HR 91 | Ht 75.0 in | Wt 284.0 lb

## 2019-06-20 DIAGNOSIS — E1169 Type 2 diabetes mellitus with other specified complication: Secondary | ICD-10-CM

## 2019-06-20 DIAGNOSIS — I2510 Atherosclerotic heart disease of native coronary artery without angina pectoris: Secondary | ICD-10-CM | POA: Diagnosis not present

## 2019-06-20 DIAGNOSIS — E785 Hyperlipidemia, unspecified: Secondary | ICD-10-CM | POA: Diagnosis not present

## 2019-06-20 DIAGNOSIS — I10 Essential (primary) hypertension: Secondary | ICD-10-CM

## 2019-06-20 DIAGNOSIS — R002 Palpitations: Secondary | ICD-10-CM

## 2019-06-20 DIAGNOSIS — E669 Obesity, unspecified: Secondary | ICD-10-CM

## 2019-06-20 NOTE — Patient Instructions (Addendum)
Medication Instructions:  Your physician recommends that you continue on your current medications as directed. Please refer to the Current Medication list given to you today.  *If you need a refill on your cardiac medications before your next appointment, please call your pharmacy*  Lab Work: Your physician recommends that you return for a FASTING lipid profile  If you have labs (blood work) drawn today and your tests are completely normal, you will receive your results only by: Marland Kitchen MyChart Message (if you have MyChart) OR . A paper copy in the mail If you have any lab test that is abnormal or we need to change your treatment, we will call you to review the results.  Testing/Procedures: None ordered  Follow-Up: At [facility], you and your health needs are our priority.  As part of our continuing mission to provide you with exceptional heart care, we have created designated Provider Care Teams.  These Care Teams include your primary Cardiologist (physician) and Advanced Practice Providers (APPs -  Physician Assistants and Nurse Practitioners) who all work together to provide you with the care you need, when you need it.  Your next appointment:   12 month(s)  The format for your next appointment:   In Person  Provider:   You may see Tobias Alexander, MD or one of the following Advanced Practice Providers on your designated Care Team:    Ronie Spies, PA-C  Jacolyn Reedy, PA-C   Other Instructions  Please continue to monitor your heart rate at rest and with exercise

## 2019-06-21 ENCOUNTER — Other Ambulatory Visit: Payer: Medicare Other | Admitting: *Deleted

## 2019-06-21 DIAGNOSIS — I2510 Atherosclerotic heart disease of native coronary artery without angina pectoris: Secondary | ICD-10-CM | POA: Diagnosis not present

## 2019-06-21 DIAGNOSIS — E1169 Type 2 diabetes mellitus with other specified complication: Secondary | ICD-10-CM | POA: Diagnosis not present

## 2019-06-21 DIAGNOSIS — E669 Obesity, unspecified: Secondary | ICD-10-CM

## 2019-06-21 DIAGNOSIS — I10 Essential (primary) hypertension: Secondary | ICD-10-CM | POA: Diagnosis not present

## 2019-06-21 DIAGNOSIS — E785 Hyperlipidemia, unspecified: Secondary | ICD-10-CM | POA: Diagnosis not present

## 2019-06-21 LAB — LIPID PANEL
Chol/HDL Ratio: 4.8 ratio (ref 0.0–5.0)
Cholesterol, Total: 183 mg/dL (ref 100–199)
HDL: 38 mg/dL — ABNORMAL LOW (ref >39)
LDL Chol Calc (NIH): 127 mg/dL — ABNORMAL HIGH (ref 0–99)
Triglycerides: 99 mg/dL (ref 0–149)
VLDL Cholesterol Cal: 18 mg/dL (ref 5–40)

## 2019-06-22 ENCOUNTER — Other Ambulatory Visit: Payer: Self-pay | Admitting: Internal Medicine

## 2019-06-22 ENCOUNTER — Telehealth: Payer: Self-pay | Admitting: Internal Medicine

## 2019-06-22 ENCOUNTER — Other Ambulatory Visit: Payer: Self-pay

## 2019-06-22 DIAGNOSIS — E785 Hyperlipidemia, unspecified: Secondary | ICD-10-CM

## 2019-06-22 DIAGNOSIS — E1169 Type 2 diabetes mellitus with other specified complication: Secondary | ICD-10-CM

## 2019-06-22 MED ORDER — SYNJARDY XR 12.5-1000 MG PO TB24: 2.0000 | ORAL_TABLET | Freq: Every day | ORAL | 0 refills | Status: DC

## 2019-06-22 MED ORDER — EZETIMIBE 10 MG PO TABS: 10.0000 mg | ORAL_TABLET | Freq: Every day | ORAL | 11 refills | Status: DC

## 2019-06-22 NOTE — Telephone Encounter (Signed)
Pt had samples and would like a new rx syngardy . [facility]

## 2019-07-04 DIAGNOSIS — I158 Other secondary hypertension: Secondary | ICD-10-CM | POA: Diagnosis not present

## 2019-07-04 DIAGNOSIS — M109 Gout, unspecified: Secondary | ICD-10-CM | POA: Diagnosis not present

## 2019-07-04 DIAGNOSIS — D860 Sarcoidosis of lung: Secondary | ICD-10-CM | POA: Diagnosis not present

## 2019-07-04 DIAGNOSIS — M10052 Idiopathic gout, left hip: Secondary | ICD-10-CM | POA: Diagnosis not present

## 2019-07-20 ENCOUNTER — Other Ambulatory Visit: Payer: Self-pay | Admitting: Internal Medicine

## 2019-07-20 DIAGNOSIS — I10 Essential (primary) hypertension: Secondary | ICD-10-CM

## 2019-07-26 ENCOUNTER — Other Ambulatory Visit: Payer: Self-pay | Admitting: Internal Medicine

## 2019-07-26 DIAGNOSIS — K2100 Gastro-esophageal reflux disease with esophagitis, without bleeding: Secondary | ICD-10-CM

## 2019-07-31 ENCOUNTER — Ambulatory Visit: Payer: Medicare Other | Attending: Family

## 2019-07-31 DIAGNOSIS — Z23 Encounter for immunization: Secondary | ICD-10-CM

## 2019-07-31 NOTE — Progress Notes (Signed)
   Covid-19 Vaccination Clinic  Name:  DANUEL FELICETTI    MRN: [id number] DOB: [birth year]  07/31/2019  Mr. Heideman was observed post Covid-19 immunization for 15 minutes without incidence. He was provided with Vaccine Information Sheet and instruction to access the V-Safe system.   Mr. Wogan was instructed to call 911 with any severe reactions post vaccine: Marland Kitchen Difficulty breathing  . Swelling of your face and throat  . A fast heartbeat  . A bad rash all over your body  . Dizziness and weakness    Immunizations Administered    Name Date Dose VIS Date Route   Moderna COVID-19 Vaccine 07/31/2019  2:46 PM 0.5 mL 05/09/2019 Intramuscular   Manufacturer: Moderna   Lot: [id number]   NDC: 22575-051-83

## 2019-08-11 ENCOUNTER — Telehealth: Payer: Self-pay | Admitting: Internal Medicine

## 2019-08-11 NOTE — Chronic Care Management (AMB) (Signed)
  Chronic Care Management   Note  08/11/2019 Name: [name] MRN: [id number] DOB: August 05, 1970  [name] is a 49 y.o. year old male who is a primary care patient of [name], MD. I reached out to [name] by phone today in response to a referral sent by [name] PCP, [name], MD.   [name] was given information about Chronic Care Management services today including:  1. CCM service includes personalized support from designated clinical staff supervised by his physician, including individualized plan of care and coordination with other care providers 2. 24/7 contact phone numbers for assistance for urgent and routine care needs. 3. Service will only be billed when office clinical staff spend 20 minutes or more in a month to coordinate care. 4. Only one practitioner may furnish and bill the service in a calendar month. 5. The patient may stop CCM services at any time (effective at the end of the month) by phone call to the office staff.   Patient agreed to services and verbal consent obtained.   Follow up plan:   Raynicia Dukes [employer] Scheduler

## 2019-08-20 ENCOUNTER — Telehealth: Payer: Self-pay | Admitting: Internal Medicine

## 2019-08-20 DIAGNOSIS — E1169 Type 2 diabetes mellitus with other specified complication: Secondary | ICD-10-CM

## 2019-08-20 DIAGNOSIS — I10 Essential (primary) hypertension: Secondary | ICD-10-CM

## 2019-08-22 ENCOUNTER — Other Ambulatory Visit: Payer: Self-pay | Admitting: Internal Medicine

## 2019-08-22 ENCOUNTER — Ambulatory Visit: Payer: Medicare Other | Admitting: Endocrinology

## 2019-08-22 DIAGNOSIS — I10 Essential (primary) hypertension: Secondary | ICD-10-CM

## 2019-08-22 DIAGNOSIS — E1169 Type 2 diabetes mellitus with other specified complication: Secondary | ICD-10-CM

## 2019-08-22 DIAGNOSIS — Z0289 Encounter for other administrative examinations: Secondary | ICD-10-CM

## 2019-08-22 MED ORDER — IRBESARTAN-HYDROCHLOROTHIAZIDE 300-12.5 MG PO TABS: 1.0000 | ORAL_TABLET | Freq: Every day | ORAL | 0 refills | Status: DC

## 2019-08-22 NOTE — Telephone Encounter (Signed)
Pt has an upcoming appt.   Okay for 30 day supply of irbesartan HCTZ?

## 2019-08-22 NOTE — Telephone Encounter (Signed)
    Patient calling for refill  1.Medication Requested:irbesartan-hydrochlorothiazide (AVALIDE) 300-12.5 MG tablet  2. Pharmacy (Name, Street, City):[phone] - [place], [place] - [address] AT [facility] OF [address]  3. On Med List: yes  4. Last Visit with PCP: 02/26/19  5. Next visit date with PCP:08/29/19   Agent: Please be advised that RX refills may take up to 3 business days. We ask that you follow-up with your pharmacy.

## 2019-08-29 ENCOUNTER — Ambulatory Visit: Payer: Medicare Other | Admitting: Internal Medicine

## 2019-08-29 ENCOUNTER — Encounter: Payer: Self-pay | Admitting: Internal Medicine

## 2019-08-29 ENCOUNTER — Other Ambulatory Visit: Payer: Self-pay

## 2019-08-29 ENCOUNTER — Ambulatory Visit: Payer: Medicare Other | Admitting: Pharmacist

## 2019-08-29 VITALS — BP 120/80 | HR 91 | Temp 98.1°F | Ht 75.0 in | Wt 282.0 lb

## 2019-08-29 DIAGNOSIS — E1169 Type 2 diabetes mellitus with other specified complication: Secondary | ICD-10-CM

## 2019-08-29 DIAGNOSIS — Z0000 Encounter for general adult medical examination without abnormal findings: Secondary | ICD-10-CM | POA: Diagnosis not present

## 2019-08-29 DIAGNOSIS — I10 Essential (primary) hypertension: Secondary | ICD-10-CM

## 2019-08-29 DIAGNOSIS — K2100 Gastro-esophageal reflux disease with esophagitis, without bleeding: Secondary | ICD-10-CM

## 2019-08-29 DIAGNOSIS — M1000 Idiopathic gout, unspecified site: Secondary | ICD-10-CM

## 2019-08-29 DIAGNOSIS — I25118 Atherosclerotic heart disease of native coronary artery with other forms of angina pectoris: Secondary | ICD-10-CM

## 2019-08-29 DIAGNOSIS — I2510 Atherosclerotic heart disease of native coronary artery without angina pectoris: Secondary | ICD-10-CM

## 2019-08-29 DIAGNOSIS — K760 Fatty (change of) liver, not elsewhere classified: Secondary | ICD-10-CM

## 2019-08-29 DIAGNOSIS — R0609 Other forms of dyspnea: Secondary | ICD-10-CM

## 2019-08-29 DIAGNOSIS — R0600 Dyspnea, unspecified: Secondary | ICD-10-CM

## 2019-08-29 DIAGNOSIS — E785 Hyperlipidemia, unspecified: Secondary | ICD-10-CM

## 2019-08-29 DIAGNOSIS — E669 Obesity, unspecified: Secondary | ICD-10-CM

## 2019-08-29 LAB — URINALYSIS, ROUTINE W REFLEX MICROSCOPIC
Bilirubin Urine: NEGATIVE
Hgb urine dipstick: NEGATIVE
Leukocytes,Ua: NEGATIVE
Nitrite: NEGATIVE
RBC / HPF: NONE SEEN (ref 0–?)
Specific Gravity, Urine: 1.015 (ref 1.000–1.030)
Total Protein, Urine: NEGATIVE
Urine Glucose: >=1000 — AB
Urobilinogen, UA: 1 (ref 0.0–1.0)
WBC, UA: NONE SEEN (ref 0–?)
pH: 6.5 (ref 5.0–8.0)

## 2019-08-29 LAB — HEPATIC FUNCTION PANEL
ALT: 19 U/L (ref 0–53)
AST: 18 U/L (ref 0–37)
Albumin: 4.2 g/dL (ref 3.5–5.2)
Alkaline Phosphatase: 84 U/L (ref 39–117)
Bilirubin, Direct: 0.1 mg/dL (ref 0.0–0.3)
Total Bilirubin: 0.4 mg/dL (ref 0.2–1.2)
Total Protein: 7.5 g/dL (ref 6.0–8.3)

## 2019-08-29 LAB — CBC WITH DIFFERENTIAL/PLATELET
Basophils Absolute: 0.1 10*3/uL (ref 0.0–0.1)
Basophils Relative: 1 % (ref 0.0–3.0)
Eosinophils Absolute: 0.1 10*3/uL (ref 0.0–0.7)
Eosinophils Relative: 2.2 % (ref 0.0–5.0)
HCT: 43.2 % (ref 39.0–52.0)
Hemoglobin: 14.6 g/dL (ref 13.0–17.0)
Lymphocytes Relative: 40.6 % (ref 12.0–46.0)
Lymphs Abs: 2.1 10*3/uL (ref 0.7–4.0)
MCHC: 33.7 g/dL (ref 30.0–36.0)
MCV: 79.8 fl (ref 78.0–100.0)
Monocytes Absolute: 0.5 10*3/uL (ref 0.1–1.0)
Monocytes Relative: 10.3 % (ref 3.0–12.0)
Neutro Abs: 2.4 10*3/uL (ref 1.4–7.7)
Neutrophils Relative %: 45.9 % (ref 43.0–77.0)
Platelets: 246 10*3/uL (ref 150.0–400.0)
RBC: 5.42 Mil/uL (ref 4.22–5.81)
RDW: 15.3 % (ref 11.5–15.5)
WBC: 5.2 10*3/uL (ref 4.0–10.5)

## 2019-08-29 LAB — LIPID PANEL
Cholesterol: 111 mg/dL (ref 0–200)
HDL: 32.5 mg/dL — ABNORMAL LOW (ref >39.00)
LDL Cholesterol: 58 mg/dL (ref 0–99)
NonHDL: 78.67
Total CHOL/HDL Ratio: 3
Triglycerides: 101 mg/dL (ref 0.0–149.0)
VLDL: 20.2 mg/dL (ref 0.0–40.0)

## 2019-08-29 LAB — MICROALBUMIN / CREATININE URINE RATIO
Creatinine,U: 109.4 mg/dL
Microalb Creat Ratio: 0.9 mg/g (ref 0.0–30.0)
Microalb, Ur: 1 mg/dL (ref 0.0–1.9)

## 2019-08-29 LAB — BASIC METABOLIC PANEL
BUN: 16 mg/dL (ref 6–23)
CO2: 30 mEq/L (ref 19–32)
Calcium: 9.3 mg/dL (ref 8.4–10.5)
Chloride: 101 mEq/L (ref 96–112)
Creatinine, Ser: 0.92 mg/dL (ref 0.40–1.50)
GFR: 106.83 mL/min (ref >60.00)
Glucose, Bld: 123 mg/dL — ABNORMAL HIGH (ref 70–99)
Potassium: 4 mEq/L (ref 3.5–5.1)
Sodium: 137 mEq/L (ref 135–145)

## 2019-08-29 LAB — HEMOGLOBIN A1C: Hgb A1c MFr Bld: 6.8 % — ABNORMAL HIGH (ref 4.6–6.5)

## 2019-08-29 LAB — TROPONIN I (HIGH SENSITIVITY): High Sens Troponin I: 5 ng/L (ref 2–17)

## 2019-08-29 NOTE — Chronic Care Management (AMB) (Signed)
Chronic Care Management Pharmacy  Name: [name]  MRN: [id number] DOB: 06-09-1970   Chief Complaint/ HPI  [name],  49 y.o. , male presents for their Initial CCM visit with the clinical pharmacist via telephone due to COVID-19 Pandemic.  PCP : Xavier Grandchild, MD   From [facility], [place] since 1997, lives with fiance. Weight is biggest issue.  Their chronic conditions include: HTN, nonobstructive CAD (2015), T2DM, HLD, GERD, gout, exercise-induced asthma, epilepsy  Office Visits: 08/29/19 Dr [name] OV:   02/20/20 Dr [name] OV: A1c up to 8.2, started Synjardy (samples).   Consult Visit: 06/20/19 PA [name] (cardiology): No chest pain, encouraged exercise. Had palpitations in June 2020, holding off on restarting a BB due to controlled BP and no recent palpitations.  04/24/19 Dr [name] (endocrine): uses Synjardy samples. Declines Brand name meds d/t cost. No med changes.   02/23/19 Dr [name] (endocrine): added repaglinide due to A1c 8.2.   Medications: Outpatient Encounter Medications as of 08/29/2019  Medication Sig  . albuterol (VENTOLIN HFA) 108 (90 Base) MCG/ACT inhaler Inhale 2 puffs into the lungs every 6 (six) hours as needed for wheezing or shortness of breath.  . allopurinol (ZYLOPRIM) 300 MG tablet TAKE 1 TABLET(300 MG) BY MOUTH DAILY  . anastrozole (ARIMIDEX) 1 MG tablet Take 1 mg by mouth daily.  Marland Kitchen aspirin EC 81 MG tablet Take 1 tablet (81 mg total) by mouth daily.  . cholecalciferol (VITAMIN D) 1000 units tablet Take 1,000 Units by mouth daily.  . clomiPHENE (CLOMID) 50 MG tablet SMARTSIG:0.5 Tablet(s) By Mouth Every Other Day  . colchicine 0.6 MG tablet Take 0.6 mg by mouth every 4 (four) hours as needed (gout pain).  . Empagliflozin-metFORMIN HCl ER (SYNJARDY XR) 12.10-998 MG TB24 Take 2 tablets by mouth daily.  Marland Kitchen ezetimibe (ZETIA) 10 MG tablet Take 1 tablet (10 mg total) by mouth daily.  . folic acid (FOLVITE) 1 MG tablet TK 1 T PO QD  . glucose blood  (ONETOUCH VERIO) test strip 1 each by Other route daily. And lancets 1/day (Patient taking differently: 1 each by Other route daily. )  . irbesartan-hydrochlorothiazide (AVALIDE) 300-12.5 MG tablet Take 1 tablet by mouth daily.  . methotrexate (RHEUMATREX) 2.5 MG tablet Take 10 mg by mouth once a week.  . nitroGLYCERIN (NITROSTAT) 0.4 MG SL tablet Place 1 tablet (0.4 mg total) under the tongue every 5 (five) minutes as needed for chest pain.  Marland Kitchen Olopatadine HCl 0.2 % SOLN Apply 1 drop to eye daily.  Marland Kitchen omeprazole (PRILOSEC) 40 MG capsule TAKE 1 CAPSULE BY MOUTH TWICE DAILY  . pioglitazone (ACTOS) 30 MG tablet Take 1 tablet (30 mg total) by mouth daily.  . potassium chloride SA (KLOR-CON) 20 MEQ tablet TAKE 1 TABLET(20 MEQ) BY MOUTH TWICE DAILY  . repaglinide (PRANDIN) 0.5 MG tablet Take 1 tablet (0.5 mg total) by mouth 3 (three) times daily before meals.  . rosuvastatin (CRESTOR) 40 MG tablet Take 1 tablet (40 mg total) by mouth daily.  . saw palmetto 500 MG capsule Take 500 mg by mouth daily.  . sucralfate (CARAFATE) 1 g tablet TAKE 1 TABLET(1 GRAM) BY MOUTH FOUR TIMES DAILY AT BEDTIME WITH MEALS  . [EXPIRED] torsemide (DEMADEX) 20 MG tablet Take 1 tablet (20 mg total) by mouth 2 (two) times daily.   No facility-administered encounter medications on file as of 08/29/2019.     Current Diagnosis/Assessment:  Goals Addressed  This Visit's Progress   . Pharmacy Care Plan       CARE PLAN ENTRY  Current Barriers:  . Chronic Disease Management support, education, and care coordination needs related to CAD, HTN, HLD, and DMII  Pharmacist Clinical Goal(s):  Marland Kitchen Maintain LDL < 70 . Maintain A1c < 7.0%, fasting sugar < 130 and 2-hr post meal sugar < 180 . Maintain BP < 130/80 . Ensure safety, efficacy, and affordability of medications  Interventions: . Comprehensive medication review performed. . Recommend discussing anastrozole and clomiphene with urologist if no perceived  benefit . Discussed how rosuvastatin and ezetimibe work together to lower cholesterol . Requested refill for colchicine . May reduce omeprazole to once a day or just as needed for heartburn  Patient Self Care Activities:  . Self administers medications as prescribed, Calls pharmacy for medication refills, and Calls provider office for new concerns or questions  Initial goal documentation       Diabetes   Recent Relevant Labs: Lab Results  Component Value Date/Time   HGBA1C 6.8 (H) 08/29/2019 08:47 AM   HGBA1C 6.4 (A) 04/24/2019 08:03 AM   HGBA1C 8.2 (A) 02/20/2019 04:42 PM   HGBA1C 7.2 (H) 06/29/2017 09:23 AM   MICROALBUR 1.0 08/29/2019 08:47 AM   MICROALBUR <0.7 06/14/2018 03:56 PM    Lab Results  Component Value Date   CREATININE 0.92 08/29/2019   CREATININE 0.86 02/20/2019   CREATININE 1.06 06/14/2018   Checking BG: 2x per Day  Recent FBG Readings: 140s-150s Recent 2hr PP BG readings:  200s-300  Patient has failed these meds in past: Invokana, Farxiga, Jardiance, [name] Patient is currently controlled on the following medications: Synjardy 12.10-998 mg - 2 tablets daily, pioglitazone 30 mg daily, repaglinide 0.5 mg with meals   Last diabetic Foot exam:  Lab Results  Component Value Date/Time   HMDIABEYEEXA No Retinopathy 09/13/2017 12:00 AM    Last diabetic Eye exam:  Lab Results  Component Value Date/Time   HMDIABFOOTEX Done 04/10/2010 12:00 AM     We discussed: diet and exercise extensively, A1c and BG goals, components of Synjardy, mechanism of repaglinide and importance of timing with meals. Pt reports some high BG especially after meals, discussed possibility of adding a GLP-1, patient is willing, however A1c was checked today and is controlled at 6.8.   Plan  Continue current medications and control with diet and exercise    Hypertension   BP today is:  <130/80  Office blood pressures are  BP Readings from Last 3 Encounters:  08/29/19 120/80   06/20/19 112/72  04/24/19 118/82    Patient has failed these meds in the past: amlodipine, carvedilol, diltiazem, furosemide, metoprolol, valsartan Patient is currently controlled on the following medications: irbesartan-HCTZ 300-12.5 mg daily, torsemide 20 mg daily, potassium chloride 20 meq - 2 tabs daily   Patient checks BP at home daily  Patient home BP readings are ranging: 120/80s  We discussed diet and exercise extensively, pt does not take torsemide every day due to frequency of urination, but does take it when he noticed swelling or eats salty foods. He does take potassium daily. He reports occasional muscle cramps, especially at night  Plan  Continue current medications and control with diet and exercise    Hyperlipidemia/CAD   Lipid Panel     Component Value Date/Time   CHOL 111 08/29/2019 0847   CHOL 183 06/21/2019 0815   TRIG 101.0 08/29/2019 0847   TRIG 90 06/25/2009 0000   HDL 32.50 (L) 08/29/2019  0847   HDL 38 (L) 06/21/2019 0815   CHOLHDL 3 08/29/2019 0847   VLDL 20.2 08/29/2019 0847   LDLCALC 58 08/29/2019 0847   LDLCALC 127 (H) 06/21/2019 0815   LABVLDL 18 06/21/2019 0815     The ASCVD Risk score (Goff DC Jr., et al., 2013) failed to calculate for the following reasons:   The valid total cholesterol range is 130 to 320 mg/dL   Patient has failed these meds in past: atorvastatin, pravastatin Patient is currently controlled on the following medications: rosuvastatin 40 mg daily, ezetimibe 10 mg daily, nitroglycerin 0.4 mg SL prn, aspirin 81 mg daily     We discussed:  diet and exercise extensively, cholesterol goals, consequences of elevated cholesterol, mechanisms of statin and ezetimibe for synergistic cholesterol lowering.  Plan  Continue current medications and control with diet and exercise    Exercise-induced asthma   Tobacco Status:  Social History   Tobacco Use  Smoking Status Former Smoker  . Packs/day: 1.00  . Years: 10.00  . Pack  years: 10.00  . Types: Cigarettes  . Quit date: 05/18/2014  . Years since quitting: 5.2  Smokeless Tobacco Never Used    Patient has failed these meds in past: n/a Patient is currently controlled on the following medications: albuterol HFA prn  Using maintenance inhaler regularly? No Frequency of rescue inhaler use:  infrequently  We discussed:  proper inhaler technique; pt only needs inhaler during allergy season, may use a few times per month.  Plan  Continue current medications  Gout   Patient has failed these meds in past: n/a Patient is currently controlled on the following medications: allopurinol 300 mg daily, colchicine 0.6 mg PRN  We discussed:   Pt reports last gout flare 3 months ago. He knows foods that worsen gout - wine, shell fish. He ran out of colchicine, he does have success with cherry juice as a home remedy to treat gout flares.   Plan  Continue current medications and control with diet and exercise  Recommended avoiding trigger foods Request refill for colchicine  RA   Patient has failed these meds in past: n/a Patient is currently controlled on the following medications: methotrexate 10 mg (2.5 mg x 4) once a week, folic acid 1 g daily  We discussed: role of immunosuppressants in RA, importance of folic acid supplementation with methotrexate. Pt reports he usually gets prednisone to treat RA flares, but does not like that prednisone causes weight gain and high sugars.  Plan  Continue current medications  Hypogonadism   Patient has failed these meds in past: testosterone injections Patient is currently uncontrolled on the following medications: anastrozole 1 mg daily clomiphene 25 mg (1/2 of 50 mg) QOD  We discussed:  Pt reports he was taken off of testosterone due to his cardiac history. He does not think current meds are helping with his symptoms, he reports low energy and low libido.  Advised to discuss with urologist.    Plan  Continue  current medications  Pt to f/u with urologist regarding lack of benefit from anastrozole and clomiphene   GERD   Patient has failed these meds in past: n/a Patient is currently controlled on the following medications: omeprazole 40 mg BID  We discussed:  Pt denies heart burn/reflux issues, he does not miss doses of omeprazole. Discussed that he may not need BID dosing anymore, advised to decrease to once daily and see if symptoms return/worsen.  Plan  Recommend to reduce omeprazole to  once daily and assess GERD sx   Health Maintenance   Patient is currently controlled on the following medications: Vitamin D, folic acid 1 g daily, saw palmetto   We discussed:  Pt is satisfied with OTC regimen  Plan  Continue current medications    Medication Management   Pt uses [facility] for [name] medications 7-day AM-PM pill box Pt endorses 100% compliance  We discussed:  Pt is satisfied with [facility] and managing meds on his own, denies issues with affordability. His insurance prefers [facility].  Plan  Continue current med management strategy     Follow up: 6 month phone visit  Charlene Brooke, PharmD Clinical Pharmacist [facility] at [facility] [phone]

## 2019-08-29 NOTE — Patient Instructions (Addendum)
Visit Information  Thank you for meeting with me to discuss your medications! I look forward to working with you to achieve your health care goals. Below is a summary of what we talked about during the visit:  Goals Addressed            This Visit's Progress   . Pharmacy Care Plan       CARE PLAN ENTRY  Current Barriers:  . Chronic Disease Management support, education, and care coordination needs related to CAD, HTN, HLD, and DMII  Pharmacist Clinical Goal(s):  Marland Kitchen Maintain LDL < 70 . Maintain A1c < 7.0%, fasting sugar < 130 and 2-hr post meal sugar < 180 . Maintain BP < 130/80 . Ensure safety, efficacy, and affordability of medications  Interventions: . Comprehensive medication review performed. . Recommend discussing anastrozole and clomiphene with urologist if no perceived benefit . Discussed how rosuvastatin and ezetimibe work together to lower cholesterol . Requested refill for colchicine . May reduce omeprazole to once a day or just as needed for heartburn  Patient Self Care Activities:  . Self administers medications as prescribed, Calls pharmacy for medication refills, and Calls provider office for new concerns or questions  Initial goal documentation      [name] was given information about Chronic Care Management services today including:  1. CCM service includes personalized support from designated clinical staff supervised by his physician, including individualized plan of care and coordination with other care providers 2. 24/7 contact phone numbers for assistance for urgent and routine care needs. 3. Standard insurance, coinsurance, copays and deductibles apply for chronic care management only during months in which we provide at least 20 minutes of these services. Most insurances cover these services at 100%, however patients may be responsible for any copay, coinsurance and/or deductible if applicable. This service may help you avoid the need for more  expensive face-to-face services. 4. Only one practitioner may furnish and bill the service in a calendar month. 5. The patient may stop CCM services at any time (effective at the end of the month) by phone call to the office staff.  Patient agreed to services and verbal consent obtained.   The patient verbalized understanding of instructions provided today and declined a print copy of patient instruction materials.  Telephone follow up appointment with pharmacy team member scheduled for:    Al Corpus, PharmD Clinical Pharmacist [facility] at [facility] [phone]    Diabetes Mellitus and Nutrition, Adult When you have diabetes (diabetes mellitus), it is very important to have healthy eating habits because your blood sugar (glucose) levels are greatly affected by what you eat and drink. Eating healthy foods in the appropriate amounts, at about the same times every day, can help you:  Control your blood glucose.  Lower your risk of heart disease.  Improve your blood pressure.  Reach or maintain a healthy weight. Every person with diabetes is different, and each person has different needs for a meal plan. Your health care provider may recommend that you work with a diet and nutrition specialist (dietitian) to make a meal plan that is best for you. Your meal plan may vary depending on factors such as:  The calories you need.  The medicines you take.  Your weight.  Your blood glucose, blood pressure, and cholesterol levels.  Your activity level.  Other health conditions you have, such as heart or kidney disease. How do carbohydrates affect me? Carbohydrates, also called carbs, affect your blood glucose level  more than any other type of food. Eating carbs naturally raises the amount of glucose in your blood. Carb counting is a method for keeping track of how many carbs you eat. Counting carbs is important to keep your blood glucose at a healthy level,  especially if you use insulin or take certain oral diabetes medicines. It is important to know how many carbs you can safely have in each meal. This is different for every person. Your dietitian can help you calculate how many carbs you should have at each meal and for each snack. Foods that contain carbs include:  Bread, cereal, rice, pasta, and crackers.  Potatoes and corn.  Peas, beans, and lentils.  Milk and yogurt.  Fruit and juice.  Desserts, such as cakes, cookies, ice cream, and candy. How does alcohol affect me? Alcohol can cause a sudden decrease in blood glucose (hypoglycemia), especially if you use insulin or take certain oral diabetes medicines. Hypoglycemia can be a life-threatening condition. Symptoms of hypoglycemia (sleepiness, dizziness, and confusion) are similar to symptoms of having too much alcohol. If your health care provider says that alcohol is safe for you, follow these guidelines:  Limit alcohol intake to no more than 1 drink per day for nonpregnant women and 2 drinks per day for men. One drink equals 12 oz of beer, 5 oz of wine, or 1 oz of hard liquor.  Do not drink on an empty stomach.  Keep yourself hydrated with water, diet soda, or unsweetened iced tea.  Keep in mind that regular soda, juice, and other mixers may contain a lot of sugar and must be counted as carbs. What are tips for following this plan?  Reading food labels  Start by checking the serving size on the "Nutrition Facts" label of packaged foods and drinks. The amount of calories, carbs, fats, and other nutrients listed on the label is based on one serving of the item. Many items contain more than one serving per package.  Check the total grams (g) of carbs in one serving. You can calculate the number of servings of carbs in one serving by dividing the total carbs by 15. For example, if a food has 30 g of total carbs, it would be equal to 2 servings of carbs.  Check the number of grams  (g) of saturated and trans fats in one serving. Choose foods that have low or no amount of these fats.  Check the number of milligrams (mg) of salt (sodium) in one serving. Most people should limit total sodium intake to less than 2,300 mg per day.  Always check the nutrition information of foods labeled as "low-fat" or "nonfat". These foods may be higher in added sugar or refined carbs and should be avoided.  Talk to your dietitian to identify your daily goals for nutrients listed on the label. Shopping  Avoid buying canned, premade, or processed foods. These foods tend to be high in fat, sodium, and added sugar.  Shop around the outside edge of the grocery store. This includes fresh fruits and vegetables, bulk grains, fresh meats, and fresh dairy. Cooking  Use low-heat cooking methods, such as baking, instead of high-heat cooking methods like deep frying.  Cook using healthy oils, such as olive, canola, or sunflower oil.  Avoid cooking with butter, cream, or high-fat meats. Meal planning  Eat meals and snacks regularly, preferably at the same times every day. Avoid going long periods of time without eating.  Eat foods high in fiber, such as  fresh fruits, vegetables, beans, and whole grains. Talk to your dietitian about how many servings of carbs you can eat at each meal.  Eat 4-6 ounces (oz) of lean protein each day, such as lean meat, chicken, fish, eggs, or tofu. One oz of lean protein is equal to: ? 1 oz of meat, chicken, or fish. ? 1 egg. ?  cup of tofu.  Eat some foods each day that contain healthy fats, such as avocado, nuts, seeds, and fish. Lifestyle  Check your blood glucose regularly.  Exercise regularly as told by your health care provider. This may include: ? 150 minutes of moderate-intensity or vigorous-intensity exercise each week. This could be brisk walking, biking, or water aerobics. ? Stretching and doing strength exercises, such as yoga or weightlifting, at  least 2 times a week.  Take medicines as told by your health care provider.  Do not use any products that contain nicotine or tobacco, such as cigarettes and e-cigarettes. If you need help quitting, ask your health care provider.  Work with a Veterinary surgeon or diabetes educator to identify strategies to manage stress and any emotional and social challenges. Questions to ask a health care provider  Do I need to meet with a diabetes educator?  Do I need to meet with a dietitian?  What number can I call if I have questions?  When are the best times to check my blood glucose? Where to find more information:  American Diabetes Association: diabetes.org  Academy of Nutrition and Dietetics: www.eatright.[employer] of Diabetes and Digestive and Kidney Diseases (NIH): CarFlippers.tn Summary  A healthy meal plan will help you control your blood glucose and maintain a healthy lifestyle.  Working with a diet and nutrition specialist (dietitian) can help you make a meal plan that is best for you.  Keep in mind that carbohydrates (carbs) and alcohol have immediate effects on your blood glucose levels. It is important to count carbs and to use alcohol carefully. This information is not intended to replace advice given to you by your health care provider. Make sure you discuss any questions you have with your health care provider. Document Revised: 05/07/2017 Document Reviewed: 06/29/2016 Elsevier Patient Education  2020 [employer].

## 2019-08-29 NOTE — Progress Notes (Signed)
Subjective:  Patient ID: [name], male    DOB: 1971-05-13  Age: 49 y.o. MRN: [id number]  CC: Annual Exam, Hypertension, Hyperlipidemia, and Diabetes  This visit occurred during the SARS-CoV-2 public health emergency.  Safety protocols were in place, including screening questions prior to the visit, additional usage of staff PPE, and extensive cleaning of exam room while observing appropriate contact time as indicated for disinfecting solutions.    HPI JUSTINE COSSIN presents for a CPX.  Over the last few months he has developed chest tightness with exertion and DOE. He had a cardiac cath about 3.5 years ago that showed minimal non-obstructive CAD. He denies diaphoresis, dizziness, or lightheadedness.  Outpatient Medications Prior to Visit  Medication Sig Dispense Refill  . albuterol (VENTOLIN HFA) 108 (90 Base) MCG/ACT inhaler Inhale 2 puffs into the lungs every 6 (six) hours as needed for wheezing or shortness of breath. 3 Inhaler 1  . allopurinol (ZYLOPRIM) 300 MG tablet TAKE 1 TABLET(300 MG) BY MOUTH DAILY 90 tablet 0  . anastrozole (ARIMIDEX) 1 MG tablet Take 1 mg by mouth daily.    Marland Kitchen aspirin EC 81 MG tablet Take 1 tablet (81 mg total) by mouth daily. 30 tablet 3  . cholecalciferol (VITAMIN D) 1000 units tablet Take 1,000 Units by mouth daily.    . colchicine 0.6 MG tablet Take 0.6 mg by mouth every 4 (four) hours as needed (gout pain).    . Empagliflozin-metFORMIN HCl ER (SYNJARDY XR) 12.10-998 MG TB24 Take 2 tablets by mouth daily. 180 tablet 0  . ezetimibe (ZETIA) 10 MG tablet Take 1 tablet (10 mg total) by mouth daily. 30 tablet 11  . folic acid (FOLVITE) 1 MG tablet TK 1 T PO QD  1  . glucose blood (ONETOUCH VERIO) test strip 1 each by Other route daily. And lancets 1/day (Patient taking differently: 1 each by Other route daily. ) 100 each 11  . irbesartan-hydrochlorothiazide (AVALIDE) 300-12.5 MG tablet Take 1 tablet by mouth daily. 90 tablet 0  . nitroGLYCERIN  (NITROSTAT) 0.4 MG SL tablet Place 1 tablet (0.4 mg total) under the tongue every 5 (five) minutes as needed for chest pain. 25 tablet 3  . Olopatadine HCl 0.2 % SOLN Apply 1 drop to eye daily. 2.5 mL 0  . omeprazole (PRILOSEC) 40 MG capsule TAKE 1 CAPSULE BY MOUTH TWICE DAILY 180 capsule 0  . pioglitazone (ACTOS) 30 MG tablet Take 1 tablet (30 mg total) by mouth daily. 90 tablet 3  . potassium chloride SA (KLOR-CON) 20 MEQ tablet TAKE 1 TABLET(20 MEQ) BY MOUTH TWICE DAILY 180 tablet 0  . repaglinide (PRANDIN) 0.5 MG tablet Take 1 tablet (0.5 mg total) by mouth 3 (three) times daily before meals. 90 tablet 11  . rosuvastatin (CRESTOR) 40 MG tablet Take 1 tablet (40 mg total) by mouth daily. 90 tablet 1  . sucralfate (CARAFATE) 1 g tablet TAKE 1 TABLET(1 GRAM) BY MOUTH FOUR TIMES DAILY AT BEDTIME WITH MEALS 120 tablet 5  . clomiPHENE (CLOMID) 50 MG tablet SMARTSIG:0.5 Tablet(s) By Mouth Every Other Day    . methotrexate (RHEUMATREX) 2.5 MG tablet Take 10 mg by mouth once a week.     No facility-administered medications prior to visit.    ROS Review of Systems  Constitutional: Negative for appetite change, diaphoresis and fatigue.  HENT: Negative.   Eyes: Negative for visual disturbance.  Respiratory: Positive for chest tightness and shortness of breath (DOE). Negative for wheezing.   Cardiovascular: Negative  for chest pain, palpitations and leg swelling.  Gastrointestinal: Negative for abdominal pain, constipation, nausea and vomiting.  Endocrine: Negative.  Negative for polydipsia, polyphagia and polyuria.  Genitourinary: Negative.  Negative for difficulty urinating.  Musculoskeletal: Negative.  Negative for arthralgias and myalgias.  Skin: Negative.   Allergic/Immunologic: Negative.   Neurological: Negative.  Negative for dizziness and weakness.  Hematological: Negative for adenopathy. Does not bruise/bleed easily.  Psychiatric/Behavioral: Negative.     Objective:  BP 120/80 (BP  Location: Left Arm, Patient Position: Sitting, Cuff Size: Large)   Pulse 91   Temp 98.1 F (36.7 C) (Oral)   Ht 6\' 3"  (1.905 m)   Wt 282 lb (127.9 kg)   SpO2 97%   BMI 35.25 kg/m   BP Readings from Last 3 Encounters:  08/29/19 120/80  06/20/19 112/72  04/24/19 118/82    Wt Readings from Last 3 Encounters:  08/29/19 282 lb (127.9 kg)  06/20/19 284 lb (128.8 kg)  04/24/19 282 lb 3.2 oz (128 kg)    Physical Exam Vitals reviewed.  Constitutional:      Appearance: Normal appearance.  HENT:     Nose: Nose normal.     Mouth/Throat:     Mouth: Mucous membranes are moist.  Eyes:     General: No scleral icterus.    Conjunctiva/sclera: Conjunctivae normal.  Cardiovascular:     Rate and Rhythm: Normal rate and regular rhythm.     Heart sounds: No murmur.     Comments: EKG - NSR, 86 bpm Inferolateral ST elevation w repol No changes compared to the the prior EKG Pulmonary:     Effort: Pulmonary effort is normal.     Breath sounds: No stridor. No wheezing, rhonchi or rales.  Abdominal:     General: Abdomen is flat. Bowel sounds are normal. There is no distension.     Palpations: Abdomen is soft. There is no hepatomegaly, splenomegaly or mass.     Tenderness: There is no abdominal tenderness.  Genitourinary:    Comments: GU and rectal exams deferred as they are done by urology Musculoskeletal:        General: Normal range of motion.     Cervical back: Neck supple.  Lymphadenopathy:     Cervical: No cervical adenopathy.  Skin:    General: Skin is warm and dry.  Neurological:     General: No focal deficit present.     Mental Status: He is alert.  Psychiatric:        Mood and Affect: Mood normal.        Behavior: Behavior normal.     Lab Results  Component Value Date   WBC 5.2 08/29/2019   HGB 14.6 08/29/2019   HCT 43.2 08/29/2019   PLT 246.0 08/29/2019   GLUCOSE 123 (H) 08/29/2019   CHOL 111 08/29/2019   TRIG 101.0 08/29/2019   HDL 32.50 (L) 08/29/2019    LDLCALC 58 08/29/2019   ALT 19 08/29/2019   AST 18 08/29/2019   NA 137 08/29/2019   K 4.0 08/29/2019   CL 101 08/29/2019   CREATININE 0.92 08/29/2019   BUN 16 08/29/2019   CO2 30 08/29/2019   TSH 0.62 06/14/2018   PSA 1.57 06/14/2018   INR 0.89 05/24/2016   HGBA1C 6.8 (H) 08/29/2019   MICROALBUR 1.0 08/29/2019    NM Hepato W/EjeCT Fract  Result Date: 09/20/2017 CLINICAL DATA:  Right upper quadrant pain with nausea and vomiting EXAM: NUCLEAR MEDICINE HEPATOBILIARY IMAGING TECHNIQUE: Sequential images of the abdomen  were obtained out to 60 minutes following intravenous administration of radiopharmaceutical. RADIOPHARMACEUTICALS:  5.3 mCi Tc-30m  Choletec IV COMPARISON:  Ultrasound from 09/19/2017 FINDINGS: There is prompt uptake of biliary tracer throughout the liver. Visualization of the biliary tree with opacification of the small bowel is noted. No significant gallbladder opacification is noted. 5.3 mg of morphine was then administered intravenously and repeat imaging was obtained. On significant delayed images following morphine administration there was filling within the gallbladder. IMPRESSION: Patent cystic duct although the gallbladder fills in a somewhat delayed fashion likely related consistent with abnormal gallbladder function. This may represent some chronic cholecystitis. No biliary obstruction is seen. Electronically Signed   By: Inez Catalina M.D.   On: 09/20/2017 15:39   DG CHEST PORT 1 VIEW  Result Date: 09/19/2017 CLINICAL DATA:  Shortness of breath. Heart murmur. Coronary artery disease. EXAM: PORTABLE CHEST 1 VIEW COMPARISON:  09/15/2017 FINDINGS: The heart size and mediastinal contours are within normal limits. Both lungs are clear. The visualized skeletal structures are unremarkable. IMPRESSION: No active disease. Electronically Signed   By: Earle Gell M.D.   On: 09/19/2017 11:05   US Abdomen Limited RUQ  Result Date: 09/19/2017 CLINICAL DATA:  Abdominal pain since  Wednesday, pancreatitis. EXAM: ULTRASOUND ABDOMEN LIMITED RIGHT UPPER QUADRANT COMPARISON:  None. FINDINGS: Gallbladder: Small layering gallstones. No gallbladder wall thickening, pericholecystic fluid, sonographic Murphy's sign or other secondary signs of acute cholecystitis. Common bile duct: Diameter: 4 mm Liver: No focal lesion identified. Diffusely echogenic parenchyma indicating fatty infiltration. Portal vein is patent on color Doppler imaging with normal direction of blood flow towards the liver. IMPRESSION: 1. Cholelithiasis without evidence of acute cholecystitis. 2. No bile duct dilatation. 3. Fatty infiltration of the liver. Electronically Signed   By: Franki Cabot M.D.   On: 09/19/2017 14:16    Assessment & Plan:   Kelden was seen today for annual exam, hypertension, hyperlipidemia and diabetes.  Diagnoses and all orders for this visit:  Diabetes mellitus type 2 in obese ([place])- His A1C is 6.8%. Blood sugars are adequately well controlled. -     Hemoglobin A1c; Future -     Basic metabolic panel; Future -     Microalbumin / creatinine urine ratio; Future -     HM Diabetes Foot Exam -     Microalbumin / creatinine urine ratio -     Basic metabolic panel -     Hemoglobin A1c  Routine general medical examination at a health care facility- Exam completed, labs reviewed, vaccines were reviewed, pt ed material was given. -     Lipid panel; Future -     Cancel: PSA; Future -     Lipid panel  Essential hypertension- His BP is adequately well controlled. -     Urinalysis, Routine w reflex microscopic; Future -     Basic metabolic panel; Future -     CBC with Differential/Platelet; Future -     EKG 12-Lead -     CBC with Differential/Platelet -     Basic metabolic panel -     Urinalysis, Routine w reflex microscopic  Hepatic steatosis- His LFT's are normal. -     Hepatic function panel; Future -     Hepatic function panel  Coronary artery disease of native artery of native  heart with stable angina pectoris ([place])- Will screen for ischemia with an MPI. -     MYOCARDIAL PERFUSION IMAGING; Future -     Troponin I (High Sensitivity); Future -  Troponin I (High Sensitivity)  DOE (dyspnea on exertion)- See above. -     MYOCARDIAL PERFUSION IMAGING; Future -     Troponin I (High Sensitivity); Future -     Troponin I (High Sensitivity)   I am having Avery T. Demaree maintain his aspirin EC, cholecalciferol, folic acid, allopurinol, nitroGLYCERIN, albuterol, colchicine, anastrozole, Olopatadine HCl, glucose blood, sucralfate, rosuvastatin, repaglinide, pioglitazone, ezetimibe, Synjardy XR, potassium chloride SA, omeprazole, irbesartan-hydrochlorothiazide, clomiPHENE, and methotrexate.  No orders of the defined types were placed in this encounter.    Follow-up: Return in about 4 weeks (around 09/26/2019).  Sanda Linger, MD

## 2019-08-29 NOTE — Patient Instructions (Signed)

## 2019-08-29 NOTE — Progress Notes (Signed)
  I have reviewed this encounter including the documentation in this note and/or discussed this patient with the care management provider. I am certifying that I agree with the content of this note as supervising physician.  Derisha Funderburke 08/29/2019 

## 2019-08-31 NOTE — Addendum Note (Signed)
Addended by: Verlan Friends on: 08/31/2019 05:36 AM   Modules accepted: Orders

## 2019-09-05 ENCOUNTER — Ambulatory Visit: Payer: Medicare Other | Attending: Family

## 2019-09-05 DIAGNOSIS — Z23 Encounter for immunization: Secondary | ICD-10-CM

## 2019-09-05 NOTE — Progress Notes (Signed)
   Covid-19 Vaccination Clinic  Name:  HARMAN LANGHANS    MRN: [id number] DOB: [birth year]  09/05/2019  Mr. Orwick was observed post Covid-19 immunization for 15 minutes without incident. He was provided with Vaccine Information Sheet and instruction to access the V-Safe system.   Mr. Spivack was instructed to call 911 with any severe reactions post vaccine: Marland Kitchen Difficulty breathing  . Swelling of face and throat  . A fast heartbeat  . A bad rash all over body  . Dizziness and weakness   Immunizations Administered    Name Date Dose VIS Date Route   Moderna COVID-19 Vaccine 09/05/2019  3:08 PM 0.5 mL 05/09/2019 Intramuscular   Manufacturer: Moderna   Lot: [id number]   NDC: 35521-747-15

## 2019-09-07 ENCOUNTER — Telehealth (HOSPITAL_COMMUNITY): Payer: Self-pay

## 2019-09-07 NOTE — Telephone Encounter (Signed)
Encounter complete. 

## 2019-09-13 ENCOUNTER — Other Ambulatory Visit: Payer: Self-pay | Admitting: Cardiology

## 2019-09-13 ENCOUNTER — Ambulatory Visit (HOSPITAL_COMMUNITY)
Admission: RE | Admit: 2019-09-13 | Discharge: 2019-09-13 | Disposition: A | Discharge status: Home / Self Care | Payer: Medicare Other | Source: Ambulatory Visit | Attending: Cardiology | Admitting: Cardiology

## 2019-09-13 ENCOUNTER — Other Ambulatory Visit: Payer: Self-pay

## 2019-09-13 DIAGNOSIS — R0600 Dyspnea, unspecified: Secondary | ICD-10-CM | POA: Diagnosis not present

## 2019-09-13 DIAGNOSIS — I25118 Atherosclerotic heart disease of native coronary artery with other forms of angina pectoris: Secondary | ICD-10-CM | POA: Diagnosis not present

## 2019-09-13 DIAGNOSIS — R0609 Other forms of dyspnea: Secondary | ICD-10-CM

## 2019-09-13 MED ORDER — TECHNETIUM TC 99M TETROFOSMIN IV KIT
28.0000 | PACK | Freq: Once | INTRAVENOUS | Status: AC | PRN
  Administered 2019-09-13: 28 via INTRAVENOUS
  Filled 2019-09-13: qty 28

## 2019-09-13 MED ORDER — REGADENOSON 0.4 MG/5ML IV SOLN
0.4000 mg | Freq: Once | INTRAVENOUS | Status: AC
  Administered 2019-09-13: 0.4 mg via INTRAVENOUS

## 2019-09-14 ENCOUNTER — Encounter: Payer: Self-pay | Admitting: Internal Medicine

## 2019-09-14 ENCOUNTER — Ambulatory Visit (HOSPITAL_COMMUNITY)
Admission: RE | Admit: 2019-09-14 | Discharge: 2019-09-14 | Disposition: A | Discharge status: Home / Self Care | Payer: Medicare Other | Source: Ambulatory Visit | Attending: Cardiovascular Disease | Admitting: Cardiovascular Disease

## 2019-09-14 LAB — MYOCARDIAL PERFUSION IMAGING
LV dias vol: 144 mL (ref 62–150)
LV sys vol: 76 mL
Peak HR: 104 {beats}/min
Rest HR: 79 {beats}/min
SDS: 3
SRS: 3
SSS: 6
TID: 1.15

## 2019-09-14 MED ORDER — TECHNETIUM TC 99M TETROFOSMIN IV KIT
32.0000 | PACK | Freq: Once | INTRAVENOUS | Status: AC | PRN
  Administered 2019-09-14: 32 via INTRAVENOUS

## 2019-09-15 ENCOUNTER — Other Ambulatory Visit: Payer: Self-pay | Admitting: Urology

## 2019-09-20 ENCOUNTER — Other Ambulatory Visit: Payer: Self-pay

## 2019-09-20 ENCOUNTER — Other Ambulatory Visit: Payer: Medicare Other | Admitting: *Deleted

## 2019-09-20 DIAGNOSIS — E785 Hyperlipidemia, unspecified: Secondary | ICD-10-CM | POA: Diagnosis not present

## 2019-09-20 LAB — LIPID PANEL
Chol/HDL Ratio: 2.6 ratio (ref 0.0–5.0)
Cholesterol, Total: 83 mg/dL — ABNORMAL LOW (ref 100–199)
HDL: 32 mg/dL — ABNORMAL LOW (ref >39)
LDL Chol Calc (NIH): 37 mg/dL (ref 0–99)
Triglycerides: 57 mg/dL (ref 0–149)
VLDL Cholesterol Cal: 14 mg/dL (ref 5–40)

## 2019-09-26 ENCOUNTER — Encounter: Payer: Self-pay | Admitting: Internal Medicine

## 2019-09-26 ENCOUNTER — Ambulatory Visit: Payer: Medicare Other | Admitting: Internal Medicine

## 2019-09-26 ENCOUNTER — Other Ambulatory Visit: Payer: Self-pay

## 2019-09-26 VITALS — BP 136/84 | HR 92 | Temp 98.4°F | Resp 16 | Ht 75.0 in | Wt 273.4 lb

## 2019-09-26 DIAGNOSIS — E669 Obesity, unspecified: Secondary | ICD-10-CM

## 2019-09-26 DIAGNOSIS — I10 Essential (primary) hypertension: Secondary | ICD-10-CM | POA: Diagnosis not present

## 2019-09-26 DIAGNOSIS — E1169 Type 2 diabetes mellitus with other specified complication: Secondary | ICD-10-CM

## 2019-09-26 DIAGNOSIS — R9430 Abnormal result of cardiovascular function study, unspecified: Secondary | ICD-10-CM | POA: Insufficient documentation

## 2019-09-26 DIAGNOSIS — I110 Hypertensive heart disease with heart failure: Secondary | ICD-10-CM | POA: Diagnosis not present

## 2019-09-26 DIAGNOSIS — IMO0002 Reserved for concepts with insufficient information to code with codable children: Secondary | ICD-10-CM | POA: Insufficient documentation

## 2019-09-26 NOTE — Progress Notes (Signed)
Subjective:  Patient ID: [name], male    DOB: [birth year]  Age: 49 y.o. MRN: [id number]  CC: Hypertension and Diabetes  This visit occurred during the SARS-CoV-2 public health emergency.  Safety protocols were in place, including screening questions prior to the visit, additional usage of staff PPE, and extensive cleaning of exam room while observing appropriate contact time as indicated for disinfecting solutions.    HPI [name] presents for f/up - He recently complained of DOE. He underwent a myocardial perfusion imaging which was negative for ischemia but it estimated his ejection fraction is about 47%. Over the last few weeks he has improved his cardiovascular conditioning. He is walking about 4 miles a day and does not experience CP, DOE, palpitations, edema, or fatigue. He has a history of LVH on prior echocardiograms. He has lost weight with lifestyle modifications.  Outpatient Medications Prior to Visit  Medication Sig Dispense Refill  . albuterol (VENTOLIN HFA) 108 (90 Base) MCG/ACT inhaler Inhale 2 puffs into the lungs every 6 (six) hours as needed for wheezing or shortness of breath. 3 Inhaler 1  . allopurinol (ZYLOPRIM) 300 MG tablet TAKE 1 TABLET(300 MG) BY MOUTH DAILY 90 tablet 0  . anastrozole (ARIMIDEX) 1 MG tablet Take 1 mg by mouth daily.    Marland Kitchen aspirin EC 81 MG tablet Take 1 tablet (81 mg total) by mouth daily. 30 tablet 3  . cholecalciferol (VITAMIN D) 1000 units tablet Take 1,000 Units by mouth daily.    . clomiPHENE (CLOMID) 50 MG tablet SMARTSIG:0.5 Tablet(s) By Mouth Every Other Day    . colchicine 0.6 MG tablet Take 0.6 mg by mouth every 4 (four) hours as needed (gout pain).    . Empagliflozin-metFORMIN HCl ER (SYNJARDY XR) 12.10-998 MG TB24 Take 2 tablets by mouth daily. 180 tablet 0  . ezetimibe (ZETIA) 10 MG tablet Take 1 tablet (10 mg total) by mouth daily. 30 tablet 11  . folic acid (FOLVITE) 1 MG tablet TK 1 T PO QD  1  . glucose blood  (ONETOUCH VERIO) test strip 1 each by Other route daily. And lancets 1/day (Patient taking differently: 1 each by Other route daily. ) 100 each 11  . irbesartan-hydrochlorothiazide (AVALIDE) 300-12.5 MG tablet Take 1 tablet by mouth daily. 90 tablet 0  . methotrexate (RHEUMATREX) 2.5 MG tablet Take 10 mg by mouth once a week.    . nitroGLYCERIN (NITROSTAT) 0.4 MG SL tablet Place 1 tablet (0.4 mg total) under the tongue every 5 (five) minutes as needed for chest pain. 25 tablet 3  . Olopatadine HCl 0.2 % SOLN Apply 1 drop to eye daily. 2.5 mL 0  . omeprazole (PRILOSEC) 40 MG capsule TAKE 1 CAPSULE BY MOUTH TWICE DAILY 180 capsule 0  . pioglitazone (ACTOS) 30 MG tablet Take 1 tablet (30 mg total) by mouth daily. 90 tablet 3  . potassium chloride SA (KLOR-CON) 20 MEQ tablet TAKE 1 TABLET(20 MEQ) BY MOUTH TWICE DAILY 180 tablet 0  . repaglinide (PRANDIN) 0.5 MG tablet Take 1 tablet (0.5 mg total) by mouth 3 (three) times daily before meals. 90 tablet 11  . rosuvastatin (CRESTOR) 40 MG tablet Take 1 tablet (40 mg total) by mouth daily. 90 tablet 1  . saw palmetto 500 MG capsule Take 500 mg by mouth daily.    . sucralfate (CARAFATE) 1 g tablet TAKE 1 TABLET(1 GRAM) BY MOUTH FOUR TIMES DAILY AT BEDTIME WITH MEALS 120 tablet 5  . tadalafil (CIALIS) 20  MG tablet TAKE ONE TABLET BY MOUTH DAILY AS NEEDED 10 tablet 4  . tamsulosin (FLOMAX) 0.4 MG CAPS capsule Take 0.4 mg by mouth daily.    Marland Kitchen torsemide (DEMADEX) 20 MG tablet TAKE 1 TABLET(20 MG) BY MOUTH TWICE DAILY 180 tablet 2   No facility-administered medications prior to visit.    ROS Review of Systems  Constitutional: Negative for appetite change, diaphoresis, fatigue and unexpected weight change.  HENT: Negative.   Eyes: Negative for visual disturbance.  Respiratory: Negative for cough, chest tightness, shortness of breath and wheezing.   Cardiovascular: Negative for chest pain, palpitations and leg swelling.  Gastrointestinal: Negative for  abdominal pain, diarrhea, nausea and vomiting.  Endocrine: Negative.   Genitourinary: Negative.  Negative for difficulty urinating.  Musculoskeletal: Negative.  Negative for arthralgias and myalgias.  Skin: Negative.  Negative for color change.  Neurological: Negative.  Negative for dizziness, weakness and light-headedness.  Hematological: Negative for adenopathy. Does not bruise/bleed easily.  Psychiatric/Behavioral: Negative.     Objective:  BP 136/84 (BP Location: Right Arm, Patient Position: Sitting, Cuff Size: Large)   Pulse 92   Temp 98.4 F (36.9 C) (Oral)   Resp 16   Ht 6\' 3"  (1.905 m)   Wt 273 lb 6.4 oz (124 kg)   SpO2 99%   BMI 34.17 kg/m   BP Readings from Last 3 Encounters:  09/26/19 136/84  08/29/19 120/80  06/20/19 112/72    Wt Readings from Last 3 Encounters:  09/26/19 273 lb 6.4 oz (124 kg)  09/13/19 282 lb (127.9 kg)  08/29/19 282 lb (127.9 kg)    Physical Exam Vitals reviewed.  Constitutional:      Appearance: Normal appearance.  HENT:     Nose: Nose normal.     Mouth/Throat:     Mouth: Mucous membranes are moist.  Eyes:     General: No scleral icterus.    Conjunctiva/sclera: Conjunctivae normal.  Cardiovascular:     Rate and Rhythm: Normal rate and regular rhythm.     Heart sounds: No murmur.  Pulmonary:     Effort: Pulmonary effort is normal.     Breath sounds: No stridor. No wheezing or rhonchi.  Abdominal:     General: Abdomen is protuberant. Bowel sounds are normal. There is no distension.     Palpations: Abdomen is soft. There is no hepatomegaly, splenomegaly or mass.     Tenderness: There is no abdominal tenderness.  Musculoskeletal:        General: Normal range of motion.     Cervical back: Neck supple.     Right lower leg: No edema.     Left lower leg: No edema.  Lymphadenopathy:     Cervical: No cervical adenopathy.  Skin:    General: Skin is warm and dry.     Coloration: Skin is not pale.  Neurological:     General: No  focal deficit present.     Mental Status: He is alert.  Psychiatric:        Mood and Affect: Mood normal.        Behavior: Behavior normal.     Lab Results  Component Value Date   WBC 5.2 08/29/2019   HGB 14.6 08/29/2019   HCT 43.2 08/29/2019   PLT 246.0 08/29/2019   GLUCOSE 123 (H) 08/29/2019   CHOL 83 (L) 09/20/2019   TRIG 57 09/20/2019   HDL 32 (L) 09/20/2019   LDLCALC 37 09/20/2019   ALT 19 08/29/2019   AST 18  08/29/2019   NA 137 08/29/2019   K 4.0 08/29/2019   CL 101 08/29/2019   CREATININE 0.92 08/29/2019   BUN 16 08/29/2019   CO2 30 08/29/2019   TSH 0.62 06/14/2018   PSA 1.57 06/14/2018   INR 0.89 05/24/2016   HGBA1C 6.8 (H) 08/29/2019   MICROALBUR 1.0 08/29/2019    MYOCARDIAL PERFUSION IMAGING  Result Date: 09/14/2019  The left ventricular ejection fraction is mildly decreased (45-54%).  Nuclear stress EF: 47%.  There was no ST segment deviation noted during stress.  No T wave inversion was noted during stress.  The study is normal.  This is a low risk study.  Low risk stress nuclear study with normal perfusion and mildly reduced left ventricular global systolic function.   Assessment & Plan:   [name] was seen today for hypertension and diabetes.  Diagnoses and all orders for this visit:  Ejection fraction < 50%- I recommended that he undergo an echocardiogram to get a better idea of how severely suppressed his ejection fraction is. -     ECHOCARDIOGRAM COMPLETE; Future  LVH (left ventricular hypertrophy) due to hypertensive disease, with heart failure (HCC)- See above -     ECHOCARDIOGRAM COMPLETE; Future  Essential hypertension- His blood pressure is adequately well controlled.  Diabetes mellitus type 2 in obese [facility])- His recent A1c was 6.8%. He has achieved adequate glycemic control. He was praised for his lifestyle modifications.   I am having [name] maintain his aspirin EC, cholecalciferol, folic acid, allopurinol, nitroGLYCERIN,  albuterol, colchicine, anastrozole, Olopatadine HCl, glucose blood, sucralfate, rosuvastatin, repaglinide, pioglitazone, ezetimibe, Synjardy XR, potassium chloride SA, omeprazole, irbesartan-hydrochlorothiazide, clomiPHENE, methotrexate, saw palmetto, tamsulosin, torsemide, and tadalafil.  No orders of the defined types were placed in this encounter.    Follow-up: No follow-ups on file.  Sanda Linger, MD

## 2019-09-28 ENCOUNTER — Encounter: Payer: Self-pay | Admitting: Internal Medicine

## 2019-09-28 NOTE — Patient Instructions (Signed)
Type 2 Diabetes Mellitus, Diagnosis, Adult Type 2 diabetes (type 2 diabetes mellitus) is a long-term (chronic) disease. In type 2 diabetes, one or both of these problems may be present:  The pancreas does not make enough of a hormone called insulin.  Cells in the body do not respond properly to insulin that the body makes (insulin resistance). Normally, insulin allows blood sugar (glucose) to enter cells in the body. The cells use glucose for energy. Insulin resistance or lack of insulin causes excess glucose to build up in the blood instead of going into cells. As a result, high blood glucose (hyperglycemia) develops. What increases the risk? The following factors may make you more likely to develop type 2 diabetes:  Having a family member with type 2 diabetes.  Being overweight or obese.  Having an inactive (sedentary) lifestyle.  Having been diagnosed with insulin resistance.  Having a history of prediabetes, gestational diabetes, or polycystic ovary syndrome (PCOS).  Being of American-Indian, African-American, Hispanic/Latino, or Asian/Pacific Islander descent. What are the signs or symptoms? In the early stage of this condition, you may not have symptoms. Symptoms develop slowly and may include:  Increased thirst (polydipsia).  Increased hunger(polyphagia).  Increased urination (polyuria).  Increased urination during the night (nocturia).  Unexplained weight loss.  Frequent infections that keep coming back (recurring).  Fatigue.  Weakness.  Vision changes, such as blurry vision.  Cuts or bruises that are slow to heal.  Tingling or numbness in the hands or feet.  Dark patches on the skin (acanthosis nigricans). How is this diagnosed? This condition is diagnosed based on your symptoms, your medical history, a physical exam, and your blood glucose level. Your blood glucose may be checked with one or more of the following blood tests:  A fasting blood glucose (FBG)  test. You will not be allowed to eat (you will fast) for 8 hours or longer before a blood sample is taken.  A random blood glucose test. This test checks blood glucose at any time of day regardless of when you ate.  An A1c (hemoglobin A1c) blood test. This test provides information about blood glucose control over the previous 2-3 months.  An oral glucose tolerance test (OGTT). This test measures your blood glucose at two times: ? After fasting. This is your baseline blood glucose level. ? Two hours after drinking a beverage that contains glucose. You may be diagnosed with type 2 diabetes if:  Your FBG level is 126 mg/dL (7.0 mmol/L) or higher.  Your random blood glucose level is 200 mg/dL (11.1 mmol/L) or higher.  Your A1c level is 6.5% or higher.  Your OGTT result is higher than 200 mg/dL (11.1 mmol/L). These blood tests may be repeated to confirm your diagnosis. How is this treated? Your treatment may be managed by a specialist called an endocrinologist. Type 2 diabetes may be treated by following instructions from your health care provider about:  Making diet and lifestyle changes. This may include: ? Following an individualized nutrition plan that is developed by a diet and nutrition specialist (registered dietitian). ? Exercising regularly. ? Finding ways to manage stress.  Checking your blood glucose level as often as told.  Taking diabetes medicines or insulin daily. This helps to keep your blood glucose levels in the healthy range. ? If you use insulin, you may need to adjust the dosage depending on how physically active you are and what foods you eat. Your health care provider will tell you how to adjust your dosage.    Taking medicines to help prevent complications from diabetes, such as: ? Aspirin. ? Medicine to lower cholesterol. ? Medicine to control blood pressure. Your health care provider will set individualized treatment goals for you. Your goals will be based on  your age, other medical conditions you have, and how you respond to diabetes treatment. Generally, the goal of treatment is to maintain the following blood glucose levels:  Before meals (preprandial): 80-130 mg/dL (4.4-7.2 mmol/L).  After meals (postprandial): below 180 mg/dL (10 mmol/L).  A1c level: less than 7%. Follow these instructions at home: Questions to ask your health care provider  Consider asking the following questions: ? Do I need to meet with a diabetes educator? ? Where can I find a support group for people with diabetes? ? What equipment will I need to manage my diabetes at home? ? What diabetes medicines do I need, and when should I take them? ? How often do I need to check my blood glucose? ? What number can I call if I have questions? ? When is my next appointment? General instructions  Take over-the-counter and prescription medicines only as told by your health care provider.  Keep all follow-up visits as told by your health care provider. This is important.  For more information about diabetes, visit: ? American Diabetes Association (ADA): www.diabetes.org ? American Association of Diabetes Educators (AADE): www.diabeteseducator.org Contact a health care provider if:  Your blood glucose is at or above 240 mg/dL (13.3 mmol/L) for 2 days in a row.  You have been sick or have had a fever for 2 days or longer, and you are not getting better.  You have any of the following problems for more than 6 hours: ? You cannot eat or drink. ? You have nausea and vomiting. ? You have diarrhea. Get help right away if:  Your blood glucose is lower than 54 mg/dL (3.0 mmol/L).  You become confused or you have trouble thinking clearly.  You have difficulty breathing.  You have moderate or large ketone levels in your urine. Summary  Type 2 diabetes (type 2 diabetes mellitus) is a long-term (chronic) disease. In type 2 diabetes, the pancreas does not make enough of a  hormone called insulin, or cells in the body do not respond properly to insulin that the body makes (insulin resistance).  This condition is treated by making diet and lifestyle changes and taking diabetes medicines or insulin.  Your health care provider will set individualized treatment goals for you. Your goals will be based on your age, other medical conditions you have, and how you respond to diabetes treatment.  Keep all follow-up visits as told by your health care provider. This is important. This information is not intended to replace advice given to you by your health care provider. Make sure you discuss any questions you have with your health care provider. Document Revised: 07/23/2017 Document Reviewed: 06/28/2015 Elsevier Patient Education  2020 Elsevier Inc.  

## 2019-10-02 DIAGNOSIS — M109 Gout, unspecified: Secondary | ICD-10-CM | POA: Diagnosis not present

## 2019-10-02 DIAGNOSIS — I158 Other secondary hypertension: Secondary | ICD-10-CM | POA: Diagnosis not present

## 2019-10-02 DIAGNOSIS — M10052 Idiopathic gout, left hip: Secondary | ICD-10-CM | POA: Diagnosis not present

## 2019-10-02 DIAGNOSIS — D860 Sarcoidosis of lung: Secondary | ICD-10-CM | POA: Diagnosis not present

## 2019-10-11 ENCOUNTER — Other Ambulatory Visit: Payer: Self-pay

## 2019-10-11 ENCOUNTER — Encounter: Payer: Self-pay | Admitting: Internal Medicine

## 2019-10-11 ENCOUNTER — Ambulatory Visit (HOSPITAL_COMMUNITY): Payer: Medicare Other | Attending: Cardiology

## 2019-10-11 DIAGNOSIS — R9430 Abnormal result of cardiovascular function study, unspecified: Secondary | ICD-10-CM | POA: Diagnosis not present

## 2019-10-11 DIAGNOSIS — I110 Hypertensive heart disease with heart failure: Secondary | ICD-10-CM

## 2019-10-26 DIAGNOSIS — I10 Essential (primary) hypertension: Secondary | ICD-10-CM | POA: Diagnosis not present

## 2019-10-26 DIAGNOSIS — H11153 Pinguecula, bilateral: Secondary | ICD-10-CM | POA: Diagnosis not present

## 2019-10-26 DIAGNOSIS — D869 Sarcoidosis, unspecified: Secondary | ICD-10-CM | POA: Diagnosis not present

## 2019-10-26 DIAGNOSIS — E119 Type 2 diabetes mellitus without complications: Secondary | ICD-10-CM | POA: Diagnosis not present

## 2019-10-26 DIAGNOSIS — H524 Presbyopia: Secondary | ICD-10-CM | POA: Diagnosis not present

## 2019-10-26 LAB — HM DIABETES EYE EXAM

## 2019-10-27 ENCOUNTER — Other Ambulatory Visit: Payer: Self-pay | Admitting: Internal Medicine

## 2019-10-27 DIAGNOSIS — I10 Essential (primary) hypertension: Secondary | ICD-10-CM

## 2019-10-31 ENCOUNTER — Other Ambulatory Visit: Payer: Self-pay | Admitting: Internal Medicine

## 2019-10-31 DIAGNOSIS — I2510 Atherosclerotic heart disease of native coronary artery without angina pectoris: Secondary | ICD-10-CM

## 2019-10-31 DIAGNOSIS — E785 Hyperlipidemia, unspecified: Secondary | ICD-10-CM

## 2019-10-31 DIAGNOSIS — K2100 Gastro-esophageal reflux disease with esophagitis, without bleeding: Secondary | ICD-10-CM

## 2019-11-13 ENCOUNTER — Other Ambulatory Visit: Payer: Self-pay | Admitting: Internal Medicine

## 2019-11-13 DIAGNOSIS — E1169 Type 2 diabetes mellitus with other specified complication: Secondary | ICD-10-CM

## 2019-11-13 DIAGNOSIS — I10 Essential (primary) hypertension: Secondary | ICD-10-CM

## 2019-11-13 DIAGNOSIS — E669 Obesity, unspecified: Secondary | ICD-10-CM

## 2019-11-22 ENCOUNTER — Encounter: Payer: Self-pay | Admitting: Internal Medicine

## 2019-12-03 ENCOUNTER — Other Ambulatory Visit: Payer: Self-pay | Admitting: Internal Medicine

## 2019-12-03 DIAGNOSIS — E669 Obesity, unspecified: Secondary | ICD-10-CM

## 2019-12-03 DIAGNOSIS — E1169 Type 2 diabetes mellitus with other specified complication: Secondary | ICD-10-CM

## 2019-12-14 DIAGNOSIS — E291 Testicular hypofunction: Secondary | ICD-10-CM | POA: Diagnosis not present

## 2019-12-20 DIAGNOSIS — M109 Gout, unspecified: Secondary | ICD-10-CM | POA: Diagnosis not present

## 2019-12-20 DIAGNOSIS — M10052 Idiopathic gout, left hip: Secondary | ICD-10-CM | POA: Diagnosis not present

## 2019-12-20 DIAGNOSIS — I158 Other secondary hypertension: Secondary | ICD-10-CM | POA: Diagnosis not present

## 2019-12-20 DIAGNOSIS — D860 Sarcoidosis of lung: Secondary | ICD-10-CM | POA: Diagnosis not present

## 2019-12-21 DIAGNOSIS — D860 Sarcoidosis of lung: Secondary | ICD-10-CM | POA: Diagnosis not present

## 2019-12-24 ENCOUNTER — Other Ambulatory Visit: Payer: Self-pay | Admitting: Urology

## 2020-01-09 ENCOUNTER — Other Ambulatory Visit: Payer: Self-pay

## 2020-01-09 ENCOUNTER — Emergency Department (HOSPITAL_BASED_OUTPATIENT_CLINIC_OR_DEPARTMENT_OTHER): Payer: Medicare Other

## 2020-01-09 ENCOUNTER — Emergency Department (HOSPITAL_BASED_OUTPATIENT_CLINIC_OR_DEPARTMENT_OTHER)
Admission: EM | Admit: 2020-01-09 | Discharge: 2020-01-09 | Disposition: A | Discharge status: Home / Self Care | Payer: Medicare Other | Attending: Emergency Medicine | Admitting: Emergency Medicine

## 2020-01-09 ENCOUNTER — Encounter (HOSPITAL_BASED_OUTPATIENT_CLINIC_OR_DEPARTMENT_OTHER): Payer: Self-pay | Admitting: Emergency Medicine

## 2020-01-09 DIAGNOSIS — Z79899 Other long term (current) drug therapy: Secondary | ICD-10-CM | POA: Diagnosis not present

## 2020-01-09 DIAGNOSIS — E119 Type 2 diabetes mellitus without complications: Secondary | ICD-10-CM | POA: Insufficient documentation

## 2020-01-09 DIAGNOSIS — R1032 Left lower quadrant pain: Secondary | ICD-10-CM | POA: Diagnosis not present

## 2020-01-09 DIAGNOSIS — R630 Anorexia: Secondary | ICD-10-CM | POA: Diagnosis not present

## 2020-01-09 DIAGNOSIS — Z87891 Personal history of nicotine dependence: Secondary | ICD-10-CM | POA: Insufficient documentation

## 2020-01-09 DIAGNOSIS — R110 Nausea: Secondary | ICD-10-CM | POA: Diagnosis not present

## 2020-01-09 DIAGNOSIS — Z7951 Long term (current) use of inhaled steroids: Secondary | ICD-10-CM | POA: Diagnosis not present

## 2020-01-09 DIAGNOSIS — Z8673 Personal history of transient ischemic attack (TIA), and cerebral infarction without residual deficits: Secondary | ICD-10-CM | POA: Diagnosis not present

## 2020-01-09 DIAGNOSIS — R197 Diarrhea, unspecified: Secondary | ICD-10-CM | POA: Diagnosis not present

## 2020-01-09 DIAGNOSIS — K760 Fatty (change of) liver, not elsewhere classified: Secondary | ICD-10-CM | POA: Diagnosis not present

## 2020-01-09 DIAGNOSIS — N281 Cyst of kidney, acquired: Secondary | ICD-10-CM | POA: Diagnosis not present

## 2020-01-09 DIAGNOSIS — R1031 Right lower quadrant pain: Secondary | ICD-10-CM | POA: Diagnosis not present

## 2020-01-09 DIAGNOSIS — I10 Essential (primary) hypertension: Secondary | ICD-10-CM | POA: Diagnosis not present

## 2020-01-09 DIAGNOSIS — I2510 Atherosclerotic heart disease of native coronary artery without angina pectoris: Secondary | ICD-10-CM | POA: Insufficient documentation

## 2020-01-09 LAB — COMPREHENSIVE METABOLIC PANEL
ALT: 19 U/L (ref 0–44)
AST: 18 U/L (ref 15–41)
Albumin: 4 g/dL (ref 3.5–5.0)
Alkaline Phosphatase: 77 U/L (ref 38–126)
Anion gap: 11 (ref 5–15)
BUN: 13 mg/dL (ref 6–20)
CO2: 28 mmol/L (ref 22–32)
Calcium: 9.2 mg/dL (ref 8.9–10.3)
Chloride: 97 mmol/L — ABNORMAL LOW (ref 98–111)
Creatinine, Ser: 0.75 mg/dL (ref 0.61–1.24)
GFR calc Af Amer: >60 mL/min (ref >60)
GFR calc non Af Amer: >60 mL/min (ref >60)
Glucose, Bld: 131 mg/dL — ABNORMAL HIGH (ref 70–99)
Potassium: 3.8 mmol/L (ref 3.5–5.1)
Sodium: 136 mmol/L (ref 135–145)
Total Bilirubin: 0.3 mg/dL (ref 0.3–1.2)
Total Protein: 7.6 g/dL (ref 6.5–8.1)

## 2020-01-09 LAB — CBC WITH DIFFERENTIAL/PLATELET
Abs Immature Granulocytes: 0.03 10*3/uL (ref 0.00–0.07)
Basophils Absolute: 0.1 10*3/uL (ref 0.0–0.1)
Basophils Relative: 1 %
Eosinophils Absolute: 0.1 10*3/uL (ref 0.0–0.5)
Eosinophils Relative: 2 %
HCT: 41.7 % (ref 39.0–52.0)
Hemoglobin: 14.3 g/dL (ref 13.0–17.0)
Immature Granulocytes: 1 %
Lymphocytes Relative: 32 %
Lymphs Abs: 2 10*3/uL (ref 0.7–4.0)
MCH: 26.7 pg (ref 26.0–34.0)
MCHC: 34.3 g/dL (ref 30.0–36.0)
MCV: 77.8 fL — ABNORMAL LOW (ref 80.0–100.0)
Monocytes Absolute: 0.6 10*3/uL (ref 0.1–1.0)
Monocytes Relative: 9 %
Neutro Abs: 3.7 10*3/uL (ref 1.7–7.7)
Neutrophils Relative %: 55 %
Platelets: 241 10*3/uL (ref 150–400)
RBC: 5.36 MIL/uL (ref 4.22–5.81)
RDW: 15.6 % — ABNORMAL HIGH (ref 11.5–15.5)
WBC: 6.5 10*3/uL (ref 4.0–10.5)
nRBC: 0 % (ref 0.0–0.2)

## 2020-01-09 LAB — URINALYSIS, MICROSCOPIC (REFLEX)

## 2020-01-09 LAB — URINALYSIS, ROUTINE W REFLEX MICROSCOPIC
Bilirubin Urine: NEGATIVE
Glucose, UA: >=500 mg/dL — AB
Hgb urine dipstick: NEGATIVE
Ketones, ur: NEGATIVE mg/dL
Leukocytes,Ua: NEGATIVE
Nitrite: NEGATIVE
Protein, ur: NEGATIVE mg/dL
Specific Gravity, Urine: 1.01 (ref 1.005–1.030)
pH: 6.5 (ref 5.0–8.0)

## 2020-01-09 LAB — LIPASE, BLOOD: Lipase: 41 U/L (ref 11–51)

## 2020-01-09 MED ORDER — ONDANSETRON HCL 4 MG/2ML IJ SOLN
4.0000 mg | Freq: Once | INTRAMUSCULAR | Status: AC
  Administered 2020-01-09: 4 mg via INTRAVENOUS
  Filled 2020-01-09: qty 2

## 2020-01-09 MED ORDER — DICYCLOMINE HCL 10 MG PO CAPS: 10.0000 mg | ORAL_CAPSULE | Freq: Three times a day (TID) | ORAL | 0 refills | Status: DC

## 2020-01-09 MED ORDER — IOHEXOL 300 MG/ML  SOLN
100.0000 mL | Freq: Once | INTRAMUSCULAR | Status: AC | PRN
  Administered 2020-01-09: 100 mL via INTRAVENOUS

## 2020-01-09 MED ORDER — MORPHINE SULFATE (PF) 4 MG/ML IV SOLN
4.0000 mg | Freq: Once | INTRAVENOUS | Status: AC
  Administered 2020-01-09: 4 mg via INTRAVENOUS
  Filled 2020-01-09: qty 1

## 2020-01-09 MED ORDER — SODIUM CHLORIDE 0.9 % IV BOLUS
1000.0000 mL | Freq: Once | INTRAVENOUS | Status: AC
  Administered 2020-01-09: 1000 mL via INTRAVENOUS

## 2020-01-09 MED FILL — DICYCLOMINE 10 MG CAPSULE: 10 | 5 days supply | Qty: 15 | Fill #0

## 2020-01-09 NOTE — ED Provider Notes (Signed)
[facility] EMERGENCY DEPARTMENT Provider Note   CSN: [id number] Arrival date & time: 01/09/20  7711     History Chief Complaint  Patient presents with  . Abdominal Pain    [name] is a 49 y.o. male.  HPI    Presents for worsening LLQ abdominal pain radiating to the back that started after eating yesterday morning. Abdominal pain is dull constant pain that started near the umbilicus and has shifted to the RLQ which he rates 7/10. States that that worsened after 2 episodes of non bloody diarrhea last night. This morning woke and felt that the abdominal pain is worse, but was able to tolerate liquids. He has been having nausea increased satiety and bloating for about a week prior to this. He denies fever, chills, cough, SOB, CP, vomiting, dysuria, weakness, or injury.   Past Medical History:  Diagnosis Date  . Anginal pain (HCC)   . Arthritis    ? 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 Followed by Dr. Nickola Major  . Arthritis    "ankles" (04/13/2016)  . Asthma   . CORONARY ARTERY DISEASE    a. cath 05/2014: mild LAD disease, normal LCx and RCA. Risk factor modification recommended.  Marland Kitchen DIABETES MELLITUS, TYPE II   . Edema   . ERECTILE DYSFUNCTION, ORGANIC   . GERD   . Gout, unspecified   . Heart murmur   . HYPERLIPIDEMIA   . HYPERTENSION   . 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 Pericarditis    recurrent  . POLYNEUROPATHY   . PULMONARY SARCOIDOSIS    Mediastinal lymphadenopathy with biospy proven sarcodosis  . Seizures (HCC)    "none in 4-5 years; don't know what kind; not related to alcohol" (04/13/2016)  . TESTICULAR HYPOFUNCTION   . TIA (transient ischemic attack)    "I don't remember when" (04/13/2016)    Patient Active Problem List   Diagnosis Date Noted  . Ejection fraction < 50% 09/26/2019  . LVH (left ventricular hypertrophy) due to hypertensive disease, with heart failure (HCC) 09/26/2019  . Routine  general medical examination at a health care facility 06/14/2018  . Hepatic steatosis 11/03/2017  . Primary narcolepsy without cataplexy 06/22/2017  . Periodic limb movements of sleep 06/22/2017  . Hypersomnia 06/22/2017  . Nonintractable epilepsy with complex partial seizures (HCC) 03/23/2017  . Drug-induced erectile dysfunction 11/06/2015  . Body mass index (BMI) of 35.0 to 35.9 with comorbidity   . Sarcoidosis (HCC) 09/29/2010  . GERD 01/07/2010  . Exercise-induced asthma 12/03/2009  . POLYNEUROPATHY 10/17/2009  . Male hypogonadism 10/08/2009  . Diabetes mellitus type 2 in obese (HCC) 10/07/2009  . Hyperlipidemia with target LDL less than 70 10/07/2009  . Gout 10/07/2009  . Essential hypertension 10/07/2009  . CAD (coronary artery disease) 10/07/2009  . Male erectile dysfunction 10/07/2009    Past Surgical History:  Procedure Laterality Date  . BRONCHOSCOPY  08/21/08  . CARDIAC CATHETERIZATION  06/25/2009   minimal disease  . CARDIAC CATHETERIZATION N/A 04/14/2016   Procedure: Left Heart Cath and Coronary Angiography;  Surgeon: Kathleene Hazel, MD;  Location: [facility] INVASIVE CV LAB;  Service: Cardiovascular;  Laterality: N/A;  . CHOLECYSTECTOMY N/A 09/21/2017   Procedure: LAPAROSCOPIC CHOLECYSTECTOMY WITH INTRAOPERATIVE CHOLANGIOGRAM;  Surgeon: Glenna Fellows, MD;  Location: [facility] ORS;  Service: General;  Laterality: N/A;  . LEFT HEART CATHETERIZATION WITH CORONARY ANGIOGRAM N/A 05/18/2014   Procedure: LEFT HEART CATHETERIZATION WITH CORONARY ANGIOGRAM;  Surgeon: Corky Crafts, MD;  Location: [facility] CATH LAB;  Service: Cardiovascular;  Laterality: N/A;  . MEDIASTINOSCOPY  11/30/08       Family History  Problem Relation Age of Onset  . Diabetes Mother   . Hypertension Mother   . Diabetes Father   . Heart disease Father 8       Fatal MI  . Heart failure Father     Social History   Tobacco Use  . Smoking status: Former Smoker    Packs/day: 1.00    Years: 10.00     Pack years: 10.00    Types: Cigarettes    Quit date: 05/18/2014    Years since quitting: 5.6  . Smokeless tobacco: Never Used  Vaping Use  . Vaping Use: Never used  Substance Use Topics  . Alcohol use: Yes    Alcohol/week: 0.0 standard drinks    Comment: 04/13/2016 "glass of wine a couple times/month"  . Drug use: No    Home Medications Prior to Admission medications   Medication Sig Start Date End Date Taking? Authorizing Provider  albuterol (VENTOLIN HFA) 108 (90 Base) MCG/ACT inhaler Inhale 2 puffs into the lungs every 6 (six) hours as needed for wheezing or shortness of breath. 07/12/17   [name], MD  allopurinol (ZYLOPRIM) 300 MG tablet TAKE 1 TABLET(300 MG) BY MOUTH DAILY 06/06/17   [name], MD  anastrozole (ARIMIDEX) 1 MG tablet Take 1 mg by mouth daily.    [provider]  aspirin EC 81 MG tablet Take 1 tablet (81 mg total) by mouth daily. 04/29/15   Newt Lukes, MD  cholecalciferol (VITAMIN D) 1000 units tablet Take 1,000 Units by mouth daily.    [provider]  clomiPHENE (CLOMID) 50 MG tablet SMARTSIG:0.5 Tablet(s) By Mouth Every Other Day 08/05/19   [provider]  colchicine 0.6 MG tablet Take 0.6 mg by mouth every 4 (four) hours as needed (gout pain).    [provider]  dicyclomine (BENTYL) 10 MG capsule Take 1 capsule (10 mg total) by mouth 3 (three) times daily before meals for 5 days. 01/09/20 01/14/20  Quincy Simmonds, MD  ezetimibe (ZETIA) 10 MG tablet Take 1 tablet (10 mg total) by mouth daily. 06/22/19   Corky Crafts, MD  folic acid (FOLVITE) 1 MG tablet TK 1 T PO QD 04/24/16   [provider]  glucose blood (ONETOUCH VERIO) test strip 1 each by Other route daily. And lancets 1/day Patient taking differently: 1 each by Other route daily.  02/16/18   Romero Belling, MD  irbesartan-hydrochlorothiazide (AVALIDE) 300-12.5 MG tablet TAKE 1 TABLET BY MOUTH DAILY 11/13/19   [name], MD  methotrexate  (RHEUMATREX) 2.5 MG tablet Take 10 mg by mouth once a week. 08/20/19   [provider]  nitroGLYCERIN (NITROSTAT) 0.4 MG SL tablet Place 1 tablet (0.4 mg total) under the tongue every 5 (five) minutes as needed for chest pain. 06/28/17   Robbie Lis M, PA-C  Olopatadine HCl 0.2 % SOLN Apply 1 drop to eye daily. 01/28/18   Evaristo Bury, NP  omeprazole (PRILOSEC) 40 MG capsule TAKE 1 CAPSULE BY MOUTH TWICE DAILY 10/31/19   [name], MD  pioglitazone (ACTOS) 30 MG tablet Take 1 tablet (30 mg total) by mouth daily. 04/26/19   Romero Belling, MD  potassium chloride SA (KLOR-CON) 20 MEQ tablet TAKE 1 TABLET(20 MEQ) BY MOUTH TWICE DAILY 10/27/19   [name], MD  repaglinide (PRANDIN) 0.5 MG tablet Take  1 tablet (0.5 mg total) by mouth 3 (three) times daily before meals. 02/23/19   Romero Belling, MD  rosuvastatin (CRESTOR) 40 MG tablet TAKE 1 TABLET(40 MG) BY MOUTH DAILY 10/31/19   [name], MD  saw palmetto 500 MG capsule Take 500 mg by mouth daily.    [provider]  sucralfate (CARAFATE) 1 g tablet TAKE 1 TABLET(1 GRAM) BY MOUTH FOUR TIMES DAILY AT BEDTIME WITH MEALS 03/28/18   Meryl Dare, MD  SYNJARDY XR 12.10-998 MG TB24 TAKE 2 TABLETS BY MOUTH DAILY 12/03/19   [name], MD  tadalafil (CIALIS) 20 MG tablet TAKE ONE TABLET BY MOUTH DAILY AS NEEDED 12/27/19   McKenzie, Mardene Celeste, MD  tamsulosin (FLOMAX) 0.4 MG CAPS capsule Take 0.4 mg by mouth daily.    McKenzie, Mardene Celeste, MD  torsemide (DEMADEX) 20 MG tablet TAKE 1 TABLET(20 MG) BY MOUTH TWICE DAILY 09/14/19   Lars Masson, MD  Empagliflozin-metFORMIN HCl ER (SYNJARDY XR) 12.10-998 MG TB24 Take 2 tablets by mouth daily. 06/22/19   [name], MD    Allergies    Amlodipine, Sulfamethoxazole, and Sulfonamide derivatives  Review of Systems   Review of Systems  Constitutional: Positive for appetite change. Negative for activity change, diaphoresis and fever.  HENT: Negative for  congestion, sinus pain and sore throat.   Eyes: Negative for pain and discharge.  Respiratory: Negative for cough, chest tightness and shortness of breath.   Cardiovascular: Negative for chest pain and leg swelling.  Gastrointestinal: Positive for abdominal distention, abdominal pain, diarrhea and nausea. Negative for blood in stool and vomiting.  Endocrine: Negative for polydipsia, polyphagia and polyuria.  Genitourinary: Negative for dysuria, flank pain and hematuria.  Musculoskeletal: Negative for arthralgias and joint swelling.  Skin: Negative for pallor and rash.  Neurological: Negative for dizziness, weakness and light-headedness.  Psychiatric/Behavioral: Negative for agitation and confusion.    Physical Exam Updated Vital Signs BP 126/84 (BP Location: Left Arm)   Pulse 89   Temp 97.7 F (36.5 C) (Oral)   Resp 16   Ht 6\' 3"  (1.905 m)   Wt 126.1 kg   SpO2 100%   BMI 34.75 kg/m   Physical Exam Vitals reviewed.  Constitutional:      Appearance: He is well-developed.  HENT:     Head: Normocephalic and atraumatic.     Mouth/Throat:     Mouth: Mucous membranes are moist.     Pharynx: Oropharynx is clear.  Eyes:     Extraocular Movements: Extraocular movements intact.     Pupils: Pupils are equal, round, and reactive to light.  Cardiovascular:     Rate and Rhythm: Normal rate and regular rhythm.     Heart sounds: Normal heart sounds. No murmur heard.  No gallop.   Pulmonary:     Effort: Pulmonary effort is normal.     Breath sounds: Normal breath sounds. No wheezing.  Abdominal:     General: Abdomen is flat. Bowel sounds are normal.     Palpations: Abdomen is soft. There is no mass.     Tenderness: There is abdominal tenderness in the right upper quadrant. There is no right CVA tenderness, left CVA tenderness, guarding or rebound. Positive signs include McBurney's sign.     Hernia: No hernia is present.  Skin:    General: Skin is warm and dry.     Capillary Refill:  Capillary refill takes less than 2 seconds.  Neurological:     General: No  focal deficit present.     Mental Status: He is alert and oriented to person, place, and time.  Psychiatric:        Mood and Affect: Mood normal.        Behavior: Behavior normal.     ED Results / Procedures / Treatments   Labs (all labs ordered are listed, but only abnormal results are displayed) Labs Reviewed  CBC WITH DIFFERENTIAL/PLATELET - Abnormal; Notable for the following components:      Result Value   MCV 77.8 (*)    RDW 15.6 (*)    All other components within normal limits  COMPREHENSIVE METABOLIC PANEL - Abnormal; Notable for the following components:   Chloride 97 (*)    Glucose, Bld 131 (*)    All other components within normal limits  URINALYSIS, ROUTINE W REFLEX MICROSCOPIC - Abnormal; Notable for the following components:   Glucose, UA >=500 (*)    All other components within normal limits  URINALYSIS, MICROSCOPIC (REFLEX) - Abnormal; Notable for the following components:   Bacteria, UA RARE (*)    All other components within normal limits  LIPASE, BLOOD    EKG None  Radiology CT Abdomen Pelvis W Contrast  Result Date: 01/09/2020 CLINICAL DATA:  Acute right lower quadrant abdominal pain. EXAM: CT ABDOMEN AND PELVIS WITH CONTRAST TECHNIQUE: Multidetector CT imaging of the abdomen and pelvis was performed using the standard protocol following bolus administration of intravenous contrast. CONTRAST:  OMNIPAQUE IOHEXOL 300 MG/ML  SOLN COMPARISON:  September 15, 2017. FINDINGS: Lower chest: No acute abnormality. Hepatobiliary: Status post cholecystectomy. Hepatic steatosis. No biliary dilatation is noted. Pancreas: Unremarkable. No pancreatic ductal dilatation or surrounding inflammatory changes. Spleen: Normal in size without focal abnormality. Adrenals/Urinary Tract: Adrenal glands appear normal. Bilateral renal cysts are noted. No hydronephrosis or renal obstruction is noted. No renal or  ureteral calculi are noted. Urinary bladder is unremarkable. Stomach/Bowel: Stomach is within normal limits. Appendix appears normal. No evidence of bowel wall thickening, distention, or inflammatory changes. Vascular/Lymphatic: No significant vascular findings are present. No enlarged abdominal or pelvic lymph nodes. Reproductive: Prostate is unremarkable. Other: No abdominal wall hernia or abnormality. No abdominopelvic ascites. Musculoskeletal: No acute or significant osseous findings. IMPRESSION: 1. Hepatic steatosis. 2. Bilateral renal cysts. 3. No acute abnormality seen in the abdomen or pelvis. Electronically Signed   By: Lupita Raider M.D.   On: 01/09/2020 09:33    Procedures Procedures (including critical care time)  Medications Ordered in ED Medications  sodium chloride 0.9 % bolus 1,000 mL (0 mLs Intravenous Stopped 01/09/20 1039)  ondansetron (ZOFRAN) injection 4 mg (4 mg Intravenous Given 01/09/20 0834)  morphine 4 MG/ML injection 4 mg (4 mg Intravenous Given 01/09/20 0834)  iohexol (OMNIPAQUE) 300 MG/ML solution 100 mL (100 mLs Intravenous Contrast Given 01/09/20 0913)    ED Course  I have reviewed the triage vital signs and the nursing notes.  Pertinent labs & imaging results that were available during my care of the patient were reviewed by me and considered in my medical decision making (see chart for details).    MDM Rules/Calculators/A&P                          49 y/o male presenting with periumbilical pain migrating to RLQ, N/V/D concerning for appendicitis, gastroenteritis, lipase.  Afebrile, stable vitals, CBC, CMP, and lipase unremarkable. CT without appendicitis or acute abnormality. Treated symptomatically with morphine and zofran with improvement, discharged  with bentyl and follow with PCP.  Final Clinical Impression(s) / ED Diagnoses Final diagnoses:  Right lower quadrant abdominal pain  Diarrhea, unspecified type    Rx / DC Orders ED Discharge Orders          Ordered    dicyclomine (BENTYL) 10 MG capsule  3 times daily before meals     Discontinue  Reprint     01/09/20 1007           Quincy Simmonds, MD 01/09/20 2109    Alvira Monday, MD 01/09/20 2248

## 2020-01-09 NOTE — ED Notes (Signed)
Diarrhea x 2 yesterday  This am rt lower abd pain w some nausea  Denies vomiting

## 2020-01-09 NOTE — ED Triage Notes (Signed)
Pt arrives alone POV c/o RLQ pain, with nausea and diarrhea.

## 2020-01-09 NOTE — Discharge Instructions (Addendum)
Imaging of your abdomen did not show appendicitis. You can take bentyl for pain. Follow up with your PCP or return if you have a fever, abdominal pain worsens or does not improve.

## 2020-01-10 ENCOUNTER — Encounter: Payer: Self-pay | Admitting: Internal Medicine

## 2020-01-10 ENCOUNTER — Ambulatory Visit (INDEPENDENT_AMBULATORY_CARE_PROVIDER_SITE_OTHER): Payer: Medicare Other | Admitting: Internal Medicine

## 2020-01-10 VITALS — BP 112/70 | HR 92 | Temp 98.2°F | Ht 75.0 in | Wt 282.0 lb

## 2020-01-10 DIAGNOSIS — E669 Obesity, unspecified: Secondary | ICD-10-CM

## 2020-01-10 DIAGNOSIS — E1169 Type 2 diabetes mellitus with other specified complication: Secondary | ICD-10-CM | POA: Diagnosis not present

## 2020-01-10 DIAGNOSIS — K581 Irritable bowel syndrome with constipation: Secondary | ICD-10-CM | POA: Diagnosis not present

## 2020-01-10 LAB — POCT GLYCOSYLATED HEMOGLOBIN (HGB A1C): Hemoglobin A1C: 7.5 % — AB (ref 4.0–5.6)

## 2020-01-10 MED ORDER — TRULANCE 3 MG PO TABS: 1.0000 | ORAL_TABLET | Freq: Every day | ORAL | 0 refills | Status: DC

## 2020-01-10 NOTE — Patient Instructions (Signed)

## 2020-01-10 NOTE — Progress Notes (Signed)
Subjective:  Patient ID: [name], male    DOB: 1971-03-02  Age: 49 y.o. MRN: [id number]  CC: Abdominal Pain and Diabetes  This visit occurred during the SARS-CoV-2 public health emergency.  Safety protocols were in place, including screening questions prior to the visit, additional usage of staff PPE, and extensive cleaning of exam room while observing appropriate contact time as indicated for disinfecting solutions.    HPI [name] presents for f/up - He was seen in the emergency department 1 day prior to this visit with the complaint of right lower quadrant abdominal pain.  He has had the pain for a couple of weeks.  He says the discomfort radiates into his right flank.  He describes the abdominal sensation as sharp, aching, bloating, and nagging.  He also suffers from constipation.  He says he has a bowel movement about once every 3 days.  He is not getting much symptom relief with Colace.  He was prescribed a medicine for irritable bowel syndrome but he says it has not helped.  He does not think his symptoms are related to IBS. He complains of weight gain but denies nausea, vomiting, diarrhea, dysuria, hematuria, bloody stool, or melena.  Labs and abdominal CT were reassuring.  Outpatient Medications Prior to Visit  Medication Sig Dispense Refill  . albuterol (VENTOLIN HFA) 108 (90 Base) MCG/ACT inhaler Inhale 2 puffs into the lungs every 6 (six) hours as needed for wheezing or shortness of breath. 3 Inhaler 1  . allopurinol (ZYLOPRIM) 300 MG tablet TAKE 1 TABLET(300 MG) BY MOUTH DAILY 90 tablet 0  . anastrozole (ARIMIDEX) 1 MG tablet Take 1 mg by mouth daily.    Marland Kitchen aspirin EC 81 MG tablet Take 1 tablet (81 mg total) by mouth daily. 30 tablet 3  . cholecalciferol (VITAMIN D) 1000 units tablet Take 1,000 Units by mouth daily.    . clomiPHENE (CLOMID) 50 MG tablet SMARTSIG:0.5 Tablet(s) By Mouth Every Other Day    . colchicine 0.6 MG tablet Take 0.6 mg by mouth every 4 (four)  hours as needed (gout pain).    Marland Kitchen ezetimibe (ZETIA) 10 MG tablet Take 1 tablet (10 mg total) by mouth daily. 30 tablet 11  . folic acid (FOLVITE) 1 MG tablet TK 1 T PO QD  1  . glucose blood (ONETOUCH VERIO) test strip 1 each by Other route daily. And lancets 1/day (Patient taking differently: 1 each by Other route daily. ) 100 each 11  . irbesartan-hydrochlorothiazide (AVALIDE) 300-12.5 MG tablet TAKE 1 TABLET BY MOUTH DAILY 90 tablet 1  . methotrexate (RHEUMATREX) 2.5 MG tablet Take 10 mg by mouth once a week.    . Olopatadine HCl 0.2 % SOLN Apply 1 drop to eye daily. 2.5 mL 0  . omeprazole (PRILOSEC) 40 MG capsule TAKE 1 CAPSULE BY MOUTH TWICE DAILY 180 capsule 0  . pioglitazone (ACTOS) 30 MG tablet Take 1 tablet (30 mg total) by mouth daily. 90 tablet 3  . potassium chloride SA (KLOR-CON) 20 MEQ tablet TAKE 1 TABLET(20 MEQ) BY MOUTH TWICE DAILY 180 tablet 0  . repaglinide (PRANDIN) 0.5 MG tablet Take 1 tablet (0.5 mg total) by mouth 3 (three) times daily before meals. 90 tablet 11  . rosuvastatin (CRESTOR) 40 MG tablet TAKE 1 TABLET(40 MG) BY MOUTH DAILY 90 tablet 1  . saw palmetto 500 MG capsule Take 500 mg by mouth daily.    . sucralfate (CARAFATE) 1 g tablet TAKE 1 TABLET(1 GRAM) BY  MOUTH FOUR TIMES DAILY AT BEDTIME WITH MEALS 120 tablet 5  . SYNJARDY XR 12.10-998 MG TB24 TAKE 2 TABLETS BY MOUTH DAILY 180 tablet 0  . tadalafil (CIALIS) 20 MG tablet TAKE ONE TABLET BY MOUTH DAILY AS NEEDED 10 tablet 3  . tamsulosin (FLOMAX) 0.4 MG CAPS capsule Take 0.4 mg by mouth daily.    Marland Kitchen torsemide (DEMADEX) 20 MG tablet TAKE 1 TABLET(20 MG) BY MOUTH TWICE DAILY 180 tablet 2  . dicyclomine (BENTYL) 10 MG capsule Take 1 capsule (10 mg total) by mouth 3 (three) times daily before meals for 5 days. 15 capsule 0  . nitroGLYCERIN (NITROSTAT) 0.4 MG SL tablet Place 1 tablet (0.4 mg total) under the tongue every 5 (five) minutes as needed for chest pain. (Patient not taking: Reported on 01/10/2020) 25 tablet 3    No facility-administered medications prior to visit.    ROS Review of Systems  Constitutional: Positive for unexpected weight change (wt gain). Negative for appetite change, chills, diaphoresis and fatigue.  HENT: Negative.   Eyes: Negative.   Respiratory: Negative for cough, chest tightness, shortness of breath and wheezing.   Cardiovascular: Negative for chest pain, palpitations and leg swelling.  Gastrointestinal: Positive for abdominal pain and constipation. Negative for blood in stool, diarrhea, nausea and vomiting.  Endocrine: Negative.  Negative for polydipsia, polyphagia and polyuria.  Genitourinary: Negative.  Negative for difficulty urinating, flank pain, hematuria and urgency.  Musculoskeletal: Negative.   Skin: Negative.   Neurological: Negative.  Negative for dizziness, weakness, light-headedness and headaches.  Hematological: Negative for adenopathy. Does not bruise/bleed easily.  Psychiatric/Behavioral: Negative.     Objective:  BP 112/70 (BP Location: Left Arm, Patient Position: Sitting, Cuff Size: Large)   Pulse 92   Temp 98.2 F (36.8 C) (Oral)   Ht 6\' 3"  (1.905 m)   Wt 282 lb (127.9 kg)   SpO2 97%   BMI 35.25 kg/m   BP Readings from Last 3 Encounters:  01/10/20 112/70  01/09/20 126/84  09/26/19 136/84    Wt Readings from Last 3 Encounters:  01/10/20 282 lb (127.9 kg)  01/09/20 278 lb (126.1 kg)  09/26/19 273 lb 6.4 oz (124 kg)    Physical Exam Vitals reviewed.  Constitutional:      General: He is not in acute distress.    Appearance: He is obese. He is not ill-appearing, toxic-appearing or diaphoretic.  HENT:     Mouth/Throat:     Mouth: Mucous membranes are moist.  Eyes:     General: No scleral icterus.    Extraocular Movements: Extraocular movements intact.  Cardiovascular:     Rate and Rhythm: Normal rate and regular rhythm.     Heart sounds: No murmur heard.   Pulmonary:     Effort: Pulmonary effort is normal.     Breath sounds:  No stridor. No wheezing, rhonchi or rales.  Abdominal:     General: Abdomen is protuberant. Bowel sounds are normal. There is no distension.     Palpations: Abdomen is soft. There is no fluid wave, hepatomegaly, splenomegaly or mass.     Tenderness: There is no abdominal tenderness. There is no guarding.  Musculoskeletal:        General: Normal range of motion.     Cervical back: Neck supple.     Right lower leg: No edema.     Left lower leg: No edema.  Skin:    General: Skin is warm and dry.  Neurological:     General:  No focal deficit present.     Mental Status: He is alert.     Lab Results  Component Value Date   WBC 6.5 01/09/2020   HGB 14.3 01/09/2020   HCT 41.7 01/09/2020   PLT 241 01/09/2020   GLUCOSE 131 (H) 01/09/2020   CHOL 83 (L) 09/20/2019   TRIG 57 09/20/2019   HDL 32 (L) 09/20/2019   LDLCALC 37 09/20/2019   ALT 19 01/09/2020   AST 18 01/09/2020   NA 136 01/09/2020   K 3.8 01/09/2020   CL 97 (L) 01/09/2020   CREATININE 0.75 01/09/2020   BUN 13 01/09/2020   CO2 28 01/09/2020   TSH 0.62 06/14/2018   PSA 1.57 06/14/2018   INR 0.89 05/24/2016   HGBA1C 7.5 (A) 01/10/2020   MICROALBUR 1.0 08/29/2019    CT Abdomen Pelvis W Contrast  Result Date: 01/09/2020 CLINICAL DATA:  Acute right lower quadrant abdominal pain. EXAM: CT ABDOMEN AND PELVIS WITH CONTRAST TECHNIQUE: Multidetector CT imaging of the abdomen and pelvis was performed using the standard protocol following bolus administration of intravenous contrast. CONTRAST:  OMNIPAQUE IOHEXOL 300 MG/ML  SOLN COMPARISON:  September 15, 2017. FINDINGS: Lower chest: No acute abnormality. Hepatobiliary: Status post cholecystectomy. Hepatic steatosis. No biliary dilatation is noted. Pancreas: Unremarkable. No pancreatic ductal dilatation or surrounding inflammatory changes. Spleen: Normal in size without focal abnormality. Adrenals/Urinary Tract: Adrenal glands appear normal. Bilateral renal cysts are noted. No  hydronephrosis or renal obstruction is noted. No renal or ureteral calculi are noted. Urinary bladder is unremarkable. Stomach/Bowel: Stomach is within normal limits. Appendix appears normal. No evidence of bowel wall thickening, distention, or inflammatory changes. Vascular/Lymphatic: No significant vascular findings are present. No enlarged abdominal or pelvic lymph nodes. Reproductive: Prostate is unremarkable. Other: No abdominal wall hernia or abnormality. No abdominopelvic ascites. Musculoskeletal: No acute or significant osseous findings. IMPRESSION: 1. Hepatic steatosis. 2. Bilateral renal cysts. 3. No acute abnormality seen in the abdomen or pelvis. Electronically Signed   By: Lupita Raider M.D.   On: 01/09/2020 09:33    Assessment & Plan:   [name] was seen today for abdominal pain and diabetes.  Diagnoses and all orders for this visit:  Diabetes mellitus type 2 in obese (HCC)- His A1c is up to 7.5%.  Will continue the current glycemic agents and I have asked him to improve his lifestyle modifications. -     Hemoglobin A1c; Future -     POCT glycosylated hemoglobin (Hb A1C)  Irritable bowel syndrome with constipation- Based on his symptoms, exam, labs, and CT scan I agree that this is IBS with constipation and pain.  He has not gotten much symptom relief with Bentyl so I recommended that he stop taking it.  I recommended that he try plecanatide. -     Plecanatide (TRULANCE) 3 MG TABS; Take 1 tablet by mouth daily.   I have discontinued Kwan T. Egger's dicyclomine. I am also having him start on Trulance. Additionally, I am having him maintain his aspirin EC, cholecalciferol, folic acid, allopurinol, nitroGLYCERIN, albuterol, colchicine, anastrozole, Olopatadine HCl, glucose blood, sucralfate, repaglinide, pioglitazone, ezetimibe, clomiPHENE, methotrexate, saw palmetto, tamsulosin, torsemide, potassium chloride SA, rosuvastatin, omeprazole, irbesartan-hydrochlorothiazide, Synjardy XR,  and tadalafil.  Meds ordered this encounter  Medications  . Plecanatide (TRULANCE) 3 MG TABS    Sig: Take 1 tablet by mouth daily.    Dispense:  15 tablet    Refill:  0   I spent 50 minutes in preparing to see the patient  by review of recent labs, imaging and procedures, obtaining and reviewing separately obtained history, communicating with the patient and family or caregiver, ordering medications, tests or procedures, and documenting clinical information in the EHR including the differential Dx, treatment, and any further evaluation and other management of 1. Diabetes mellitus type 2 in obese (HCC) 2. Irritable bowel syndrome with constipation     Follow-up: Return in about 3 months (around 04/11/2020).  Sanda Linger, MD

## 2020-01-11 DIAGNOSIS — E291 Testicular hypofunction: Secondary | ICD-10-CM | POA: Diagnosis not present

## 2020-01-11 DIAGNOSIS — R350 Frequency of micturition: Secondary | ICD-10-CM | POA: Diagnosis not present

## 2020-01-11 DIAGNOSIS — N401 Enlarged prostate with lower urinary tract symptoms: Secondary | ICD-10-CM | POA: Diagnosis not present

## 2020-01-25 ENCOUNTER — Ambulatory Visit: Payer: Medicare Other | Admitting: Internal Medicine

## 2020-01-25 ENCOUNTER — Encounter: Payer: Self-pay | Admitting: Internal Medicine

## 2020-01-25 ENCOUNTER — Other Ambulatory Visit: Payer: Self-pay

## 2020-01-25 VITALS — BP 126/76 | HR 97 | Temp 98.0°F | Ht 75.0 in | Wt 281.4 lb

## 2020-01-25 DIAGNOSIS — D84821 Immunodeficiency due to drugs: Secondary | ICD-10-CM

## 2020-01-25 DIAGNOSIS — Z7185 Encounter for immunization safety counseling: Secondary | ICD-10-CM

## 2020-01-25 DIAGNOSIS — Z862 Personal history of diseases of the blood and blood-forming organs and certain disorders involving the immune mechanism: Secondary | ICD-10-CM

## 2020-01-25 DIAGNOSIS — Z79899 Other long term (current) drug therapy: Secondary | ICD-10-CM | POA: Diagnosis not present

## 2020-01-25 DIAGNOSIS — R05 Cough: Secondary | ICD-10-CM

## 2020-01-25 DIAGNOSIS — Z7189 Other specified counseling: Secondary | ICD-10-CM

## 2020-01-25 DIAGNOSIS — R059 Cough, unspecified: Secondary | ICD-10-CM

## 2020-01-25 DIAGNOSIS — R0600 Dyspnea, unspecified: Secondary | ICD-10-CM | POA: Diagnosis not present

## 2020-01-25 DIAGNOSIS — R0609 Other forms of dyspnea: Secondary | ICD-10-CM

## 2020-01-25 NOTE — Patient Instructions (Addendum)
ICD-10-CM   1. Personal history of sarcoidosis  Z86.2   2. Cough  R05   3. Dyspnea on exertion  R06.00   4. Immunosuppression due to drug therapy  Z79.899   5. Vaccine counseling  Z71.89     Do blood cbc with diff, IgE RAST allergy profile 01/25/2020 Do HRCT supine and prone  Do full PFT along with FeNO testing on same day Recommend 3rd covid vaccine booster  Followup Oct 2021 to see DR Marchelle Gearing to discuss results

## 2020-01-25 NOTE — Addendum Note (Signed)
Addended by: Demetrio Lapping E on: 01/25/2020 10:23 AM   Modules accepted: Orders

## 2020-01-25 NOTE — Progress Notes (Signed)
04/23/2016 Acute OV : Sarcoid   49 yo male followed for Sarcoid w/ pulmonary /neuro/joint involvement , and mild persistent asthma.   TEST  Spirometry 01/14/11 >> FEV1 3.15 (78%), FVC 3.68 (72%), FEV1% 86  Spirometry 12/09/11 >> FEV1 3.25 (80%), FVC 3.85 (76%), FEV1% 84    Last seen in office 3.5 yr ago. Presents today for Sarcoid flare . Complains of joint pain , chest tightness, and increased sob, and dry cough for last couple of months. Worse for last 2 weeks.  Previously on MTX thru Rheumatology. Says he ran out of refills and Rheumatologist would not refill. Marland Kitchen Has been off for over 1-2 years ago.  Was admitted 04/13/16 with chest pain , underwent cardiac cath that showed mild non obstructive CAD. CXR showed nad. Nml H/H.  He denies fever, discolored mucus, orthopena, edema or hemotpysis. Occasional rash but nothing right now  OV 01/25/2020  Subjective:  Patient ID: [name], male , DOB: 18-Aug-1970 , age 50 y.o. , MRN: [id number] , ADDRESS: [address] [place] [address]  PCP [name], MD  01/25/2020 -   Chief Complaint  Patient presents with  . Consult    No complaints    HPI [name] 49 y.o. -this is a new consult because he is reestablishing care. He was last seen in our practice more than 3 years ago. Therefore this is a new visit. 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 He tells me that in 2010 he had sarcoidosis diagnosed when he had diffuse arthralgia shortness of breath cough fever, malaise weight loss [B symptoms] and at that point in time also had arthralgia. He also had seizures. Has not had seizures now for 6 years. He was diagnosed with sarcoidosis. He was placed on methotrexate once a week which she is continued since 2010. His last seizure was 6 years ago. At baseline now he only has mild shortness of breath with exertion but there is also some variability with it when he exerts at some points he does not have  shortness of breath but at some point he does have shortness of breath. There is no shortness of breath at rest. He has some baseline arthralgia. He says overall methotrexate is doing well for him. Then in the background of all this couple of times a year he will have a respiratory flare along with joint pain and tiredness. For this he will be treated with a short course of prednisone. He is not on maintenance prednisone. He is just immunosuppressed with methotrexate. Then most recently in July 2021 he had another flareup with joint pain tiredness shortness of breath but no fevers. However he did have night sweats. He was given 10-day prednisone and is now nearly back to his baseline. His shortness of breath is baseline but he still has a residual cough that is occasional but it is improving. He decided to reestablish with a pulmonologist and therefore is here to see me. He wants to make sure his lungs are okay.  Of note he has significant baseline environmental allergies. He reports being "awful with pollen" not otherwise specified.   His left ventricular ejection fraction in May 2021 was 55-60%. Diastolic parameters were considered normal.  His blood work in August 2021 showed creatinine normal 0.75 mg percent. Hemoglobin normal 14.3 g%. His blood eosinophils was 100 cells per cubic millimeter.  His hemoglobin A1c was elevated at 7.5   His CT abdomen  lung images in August 2021 was clear   ROS - per HPI     has a past medical history of Anginal pain (HCC), Arthritis, Arthritis, Asthma, CORONARY ARTERY DISEASE, DIABETES MELLITUS, TYPE II, Edema, ERECTILE DYSFUNCTION, ORGANIC, GERD, Gout, unspecified, Heart murmur, HYPERLIPIDEMIA, HYPERTENSION, Narcolepsy without cataplexy(347.00), Pericarditis, POLYNEUROPATHY, PULMONARY SARCOIDOSIS, Seizures (HCC), TESTICULAR HYPOFUNCTION, and TIA (transient ischemic attack).   reports that he quit smoking about 10 years ago. His smoking use included  cigarettes. He has a 10.00 pack-year smoking history. He has never used smokeless tobacco.  Past Surgical History:  Procedure Laterality Date  . BRONCHOSCOPY  08/21/08  . CARDIAC CATHETERIZATION  06/25/2009   minimal disease  . CARDIAC CATHETERIZATION N/A 04/14/2016   Procedure: Left Heart Cath and Coronary Angiography;  Surgeon: Kathleene Hazel, MD;  Location[facility] INVASIVE CV LAB;  Service: Cardiovascular;  Laterality: N/A;  . CHOLECYSTECTOMY N/A 09/21/2017   Procedure: LAPAROSCOPIC CHOLECYSTECTOMY WITH INTRAOPERATIVE CHOLANGIOGRAM;  Surgeon: Glenna Fellows, MD;  Location: [facility] ORS;  Service: General;  Laterality: N/A;  . LEFT HEART CATHETERIZATION WITH CORONARY ANGIOGRAM N/A 05/18/2014   Procedure: LEFT HEART CATHETERIZATION WITH CORONARY ANGIOGRAM;  Surgeon: Corky Crafts, MD;  Location: [facility] CATH LAB;  Service: Cardiovascular;  Laterality: N/A;  . MEDIASTINOSCOPY  11/30/08    Allergies  Allergen Reactions  . Amlodipine Other (See Comments)    Ankle swelling with 10mg , ok with 5mg  dose  . Sulfamethoxazole Rash    fever  . Sulfonamide Derivatives Rash and Other (See Comments)    Immunization History  Administered Date(s) Administered  . Influenza Split 04/06/2011, 02/17/2012, 03/16/2012  . Influenza,inj,Quad PF,6+ Mos 03/21/2013, 03/08/2014, 04/03/2015, 02/19/2016, 02/17/2017, 06/14/2018, 02/20/2019  . Moderna SARS-COVID-2 Vaccination 07/31/2019, 09/05/2019  . Pneumococcal Conjugate-13 04/29/2015  . Pneumococcal Polysaccharide-23 11/06/2015  . Tdap 03/21/2013    Family History  Problem Relation Age of Onset  . Diabetes Mother   . Hypertension Mother   . Diabetes Father   . Heart disease Father 62       Fatal MI  . Heart failure Father      Current Outpatient Medications:  .  albuterol (VENTOLIN HFA) 108 (90 Base) MCG/ACT inhaler, Inhale 2 puffs into the lungs every 6 (six) hours as needed for wheezing or shortness of breath., Disp: 3 Inhaler, Rfl: 1 .   allopurinol (ZYLOPRIM) 300 MG tablet, TAKE 1 TABLET(300 MG) BY MOUTH DAILY, Disp: 90 tablet, Rfl: 0 .  anastrozole (ARIMIDEX) 1 MG tablet, Take 1 mg by mouth daily., Disp: , Rfl:  .  aspirin EC 81 MG tablet, Take 1 tablet (81 mg total) by mouth daily., Disp: 30 tablet, Rfl: 3 .  cholecalciferol (VITAMIN D) 1000 units tablet, Take 1,000 Units by mouth daily., Disp: , Rfl:  .  clomiPHENE (CLOMID) 50 MG tablet, SMARTSIG:0.5 Tablet(s) By Mouth Every Other Day, Disp: , Rfl:  .  colchicine 0.6 MG tablet, Take 0.6 mg by mouth every 4 (four) hours as needed (gout pain)., Disp: , Rfl:  .  ezetimibe (ZETIA) 10 MG tablet, Take 1 tablet (10 mg total) by mouth daily., Disp: 30 tablet, Rfl: 11 .  folic acid (FOLVITE) 1 MG tablet, TK 1 T PO QD, Disp: , Rfl: 1 .  glucose blood (ONETOUCH VERIO) test strip, 1 each by Other route daily. And lancets 1/day (Patient taking differently: 1 each by Other route daily. ), Disp: 100 each, Rfl: 11 .  irbesartan-hydrochlorothiazide (AVALIDE) 300-12.5 MG tablet, TAKE 1 TABLET BY MOUTH DAILY, Disp: 90 tablet, Rfl: 1 .  methotrexate (RHEUMATREX) 2.5 MG tablet, Take 10 mg by mouth once a week., Disp: , Rfl:  .  nitroGLYCERIN (NITROSTAT) 0.4 MG SL tablet, Place 1 tablet (0.4 mg total) under the tongue every 5 (five) minutes as needed for chest pain., Disp: 25 tablet, Rfl: 3 .  Olopatadine HCl 0.2 % SOLN, Apply 1 drop to eye daily., Disp: 2.5 mL, Rfl: 0 .  omeprazole (PRILOSEC) 40 MG capsule, TAKE 1 CAPSULE BY MOUTH TWICE DAILY, Disp: 180 capsule, Rfl: 0 .  pioglitazone (ACTOS) 30 MG tablet, Take 1 tablet (30 mg total) by mouth daily., Disp: 90 tablet, Rfl: 3 .  Plecanatide (TRULANCE) 3 MG TABS, Take 1 tablet by mouth daily., Disp: 15 tablet, Rfl: 0 .  potassium chloride SA (KLOR-CON) 20 MEQ tablet, TAKE 1 TABLET(20 MEQ) BY MOUTH TWICE DAILY, Disp: 180 tablet, Rfl: 0 .  repaglinide (PRANDIN) 0.5 MG tablet, Take 1 tablet (0.5 mg total) by mouth 3 (three) times daily before meals.,  Disp: 90 tablet, Rfl: 11 .  rosuvastatin (CRESTOR) 40 MG tablet, TAKE 1 TABLET(40 MG) BY MOUTH DAILY, Disp: 90 tablet, Rfl: 1 .  saw palmetto 500 MG capsule, Take 500 mg by mouth daily., Disp: , Rfl:  .  sucralfate (CARAFATE) 1 g tablet, TAKE 1 TABLET(1 GRAM) BY MOUTH FOUR TIMES DAILY AT BEDTIME WITH MEALS, Disp: 120 tablet, Rfl: 5 .  SYNJARDY XR 12.10-998 MG TB24, TAKE 2 TABLETS BY MOUTH DAILY, Disp: 180 tablet, Rfl: 0 .  tadalafil (CIALIS) 20 MG tablet, TAKE ONE TABLET BY MOUTH DAILY AS NEEDED, Disp: 10 tablet, Rfl: 3 .  tamsulosin (FLOMAX) 0.4 MG CAPS capsule, Take 0.4 mg by mouth daily., Disp: , Rfl:  .  torsemide (DEMADEX) 20 MG tablet, TAKE 1 TABLET(20 MG) BY MOUTH TWICE DAILY, Disp: 180 tablet, Rfl: 2      Objective:   Vitals:   01/25/20 0928  BP: 126/76  Pulse: 97  Temp: 98 F (36.7 C)  TempSrc: Other (Comment)  SpO2: 100%  Weight: 281 lb 6.4 oz (127.6 kg)  Height: 6\' 3"  (1.905 m)    Estimated body mass index is 35.17 kg/m as calculated from the following:   Height as of this encounter: 6\' 3"  (1.905 m).   Weight as of this encounter: 281 lb 6.4 oz (127.6 kg).  @WEIGHTCHANGE @    01/25/20 0928  Weight: 281 lb 6.4 oz (127.6 kg)     Physical Exam  General Appearance:    Alert, cooperative, no distress, appears stated age - yes , Deconditioned looking - no , OBESE  - yes, Sitting on Wheelchair -  no  Head:    Normocephalic, without obvious abnormality, atraumatic  Eyes:    PERRL, conjunctiva/corneas clear,  Ears:    Normal TM's and external ear canals, both ears  Nose:   Nares normal, septum midline, mucosa normal, no drainage    or sinus tenderness. OXYGEN ON  - no . Patient is @ ra   Throat:   Lips, mucosa, and tongue normal; teeth and gums normal. Cyanosis on lips - no  Neck:   Supple, symmetrical, trachea midline, no adenopathy;    thyroid:  no enlargement/tenderness/nodules; no carotid   bruit or JVD  Back:     Symmetric, no curvature, ROM  normal, no CVA tenderness  Lungs:     Distress - no , Wheeze no, Barrell Chest - no, Purse lip breathing - no, Crackles - no   Chest Wall:    No tenderness or  deformity.    Heart:    Regular rate and rhythm, S1 and S2 normal, no rub   or gallop, Murmur - no  Breast Exam:    NOT DONE  Abdomen:     Soft, non-tender, bowel sounds active all four quadrants,    no masses, no organomegaly. Visceral obesity - yes  Genitalia:   NOT DONE  Rectal:   NOT DONE  Extremities:   Extremities - normal, Has Cane - no, Clubbing - no, Edema - no  Pulses:   2+ and symmetric all extremities  Skin:   Stigmata of Connective Tissue Disease - no  Lymph nodes:   Cervical, supraclavicular, and axillary nodes normal  Psychiatric:  Neurologic:   Pleasant - yes, Anxious - no, Flat affect - no  CAm-ICU - neg, Alert and Oriented x 3 - yes, Moves all 4s - yes, Speech - normal, Cognition - intact           Assessment:       ICD-10-CM   1. Personal history of sarcoidosis  Z86.2   2. History of cardiomyopathy  Z86.79   3. Cough  R05   4. Dyspnea on exertion  R06.00   5. Immunosuppression due to drug therapy  Z79.899   6. Vaccine counseling  Z71.89    He has been immunosuppressed with methotrexate for a long time. He is at somewhat of a risk for opportunistic infection although overall risk is low. There is also the risk of methotrexate induced lung injury although recent evidence shows this risk is very low. He could have sarcoidosis in the lung although after this many years and given his baseline symptoms I think is sarcoidosis in the lungs might have resolved. His dyspnea could easily be because of obesity diastolic dysfunction and physical deconditioning. Asthma is a certain possibility given his pollen allergies  Therefore this requires full work-up. It is outlined below.    Plan:     Patient Instructions     ICD-10-CM   1. Personal history of sarcoidosis  Z86.2   2. History of cardiomyopathy  Z86.79     3. Cough  R05   4. Dyspnea on exertion  R06.00   5. Immunosuppression due to drug therapy  Z79.899   6. Vaccine counseling  Z71.89     Do blood cbc with diff, IgE RAST allergy profile 01/25/2020 Do HRCT supine and prone  Do full PFT along with FeNO testing on same day Recommend 3rd covid vaccine booster  Followup Oct 2021 to see DR Marchelle Gearing to discuss results      SIGNATURE    Dr. Kalman Shan, M.D., F.C.C.P,  Pulmonary and Critical Care Medicine Staff Physician, [facility] Director - Interstitial Lung Disease  Program  Pulmonary Fibrosis [facility] at [facility] [place], [place], [address]  Pager: [phone], If no answer or between  15:00h - 7:00h: call [phone] Telephone: [phone]  10:04 AM 01/25/2020

## 2020-01-25 NOTE — Addendum Note (Signed)
Addended by: Demetrio Lapping E on: 01/25/2020 11:05 AM   Modules accepted: Orders

## 2020-01-25 NOTE — Addendum Note (Signed)
Addended by: Melonie [place] on: 01/25/2020 10:16 AM   Modules accepted: Orders

## 2020-01-25 NOTE — Addendum Note (Signed)
Addended by: Melonie Florida on: 01/25/2020 12:34 PM   Modules accepted: Orders

## 2020-01-25 NOTE — Addendum Note (Signed)
Addended by: Melonie Florida on: 01/25/2020 10:22 AM   Modules accepted: Orders

## 2020-02-07 ENCOUNTER — Ambulatory Visit (INDEPENDENT_AMBULATORY_CARE_PROVIDER_SITE_OTHER)
Admission: RE | Admit: 2020-02-07 | Discharge: 2020-02-07 | Disposition: A | Discharge status: Home / Self Care | Payer: Medicare Other | Source: Ambulatory Visit | Attending: Internal Medicine | Admitting: Internal Medicine

## 2020-02-07 ENCOUNTER — Other Ambulatory Visit: Payer: Self-pay

## 2020-02-07 DIAGNOSIS — R059 Cough, unspecified: Secondary | ICD-10-CM

## 2020-02-07 DIAGNOSIS — R918 Other nonspecific abnormal finding of lung field: Secondary | ICD-10-CM | POA: Diagnosis not present

## 2020-02-07 DIAGNOSIS — Z79899 Other long term (current) drug therapy: Secondary | ICD-10-CM

## 2020-02-07 DIAGNOSIS — R05 Cough: Secondary | ICD-10-CM | POA: Diagnosis not present

## 2020-02-07 DIAGNOSIS — R0600 Dyspnea, unspecified: Secondary | ICD-10-CM | POA: Diagnosis not present

## 2020-02-07 DIAGNOSIS — I700 Atherosclerosis of aorta: Secondary | ICD-10-CM | POA: Diagnosis not present

## 2020-02-07 DIAGNOSIS — I2510 Atherosclerotic heart disease of native coronary artery without angina pectoris: Secondary | ICD-10-CM | POA: Diagnosis not present

## 2020-02-07 DIAGNOSIS — D84821 Immunodeficiency due to drugs: Secondary | ICD-10-CM

## 2020-02-07 DIAGNOSIS — R911 Solitary pulmonary nodule: Secondary | ICD-10-CM | POA: Diagnosis not present

## 2020-02-07 DIAGNOSIS — R0609 Other forms of dyspnea: Secondary | ICD-10-CM

## 2020-02-07 DIAGNOSIS — Z862 Personal history of diseases of the blood and blood-forming organs and certain disorders involving the immune mechanism: Secondary | ICD-10-CM

## 2020-02-09 NOTE — Progress Notes (Signed)
No ILD. Seen for dyspnea.   Plan  - complete rest of dyspnea workup  -> pls give appt to see me back   CT Chest High Resolution  Result Date: 02/07/2020 CLINICAL DATA:  Evaluate for I LD, history of sarcoidosis and rheumatoid arthritis EXAM: CT CHEST WITHOUT CONTRAST TECHNIQUE: Multidetector CT imaging of the chest was performed following the standard protocol without intravenous contrast. High resolution imaging of the lungs, as well as inspiratory and expiratory imaging, was performed. COMPARISON:  None. FINDINGS: Cardiovascular: Scattered aortic atherosclerosis. Normal heart size. Left coronary artery calcifications. No pericardial effusion. Mediastinum/Nodes: No enlarged mediastinal, hilar, or axillary lymph nodes. Thyroid gland, trachea, and esophagus demonstrate no significant findings. Lungs/Pleura: Occasional tiny pulmonary nodules, the largest a 3 mm pulmonary nodule of the right pulmonary apex (series 5, image 47). No evidence of fibrotic interstitial lung disease. No significant air trapping on expiratory phase imaging. No pleural effusion or pneumothorax. Upper Abdomen: No acute abnormality. Musculoskeletal: No chest wall mass or suspicious bone lesions identified. IMPRESSION: 1. No evidence of fibrotic interstitial lung disease. 2. Occasional tiny pulmonary nodules, the largest a 3 mm pulmonary nodule of the right pulmonary apex. No follow-up needed if patient is low-risk (and has no known or suspected primary neoplasm). Non-contrast chest CT can be considered in 12 months if patient is high-risk. This recommendation follows the consensus statement: Guidelines for Management of Incidental Pulmonary Nodules Detected on CT Images: From the Fleischner Society 2017; Radiology 2017; 284:228-243. 3. Coronary artery disease.  Aortic Atherosclerosis (ICD10-I70.0). Electronically Signed   By: Lauralyn Primes M.D.   On: 02/07/2020 12:05

## 2020-02-11 ENCOUNTER — Emergency Department (HOSPITAL_BASED_OUTPATIENT_CLINIC_OR_DEPARTMENT_OTHER)
Admission: EM | Admit: 2020-02-11 | Discharge: 2020-02-12 | Disposition: A | Discharge status: Home / Self Care | Payer: Medicare Other | Attending: Emergency Medicine | Admitting: Emergency Medicine

## 2020-02-11 ENCOUNTER — Other Ambulatory Visit: Payer: Self-pay

## 2020-02-11 ENCOUNTER — Encounter (HOSPITAL_BASED_OUTPATIENT_CLINIC_OR_DEPARTMENT_OTHER): Payer: Self-pay | Admitting: Emergency Medicine

## 2020-02-11 DIAGNOSIS — N3000 Acute cystitis without hematuria: Secondary | ICD-10-CM

## 2020-02-11 DIAGNOSIS — I2510 Atherosclerotic heart disease of native coronary artery without angina pectoris: Secondary | ICD-10-CM | POA: Diagnosis not present

## 2020-02-11 DIAGNOSIS — Z87891 Personal history of nicotine dependence: Secondary | ICD-10-CM | POA: Diagnosis not present

## 2020-02-11 DIAGNOSIS — N390 Urinary tract infection, site not specified: Secondary | ICD-10-CM | POA: Diagnosis not present

## 2020-02-11 DIAGNOSIS — R112 Nausea with vomiting, unspecified: Secondary | ICD-10-CM | POA: Diagnosis not present

## 2020-02-11 DIAGNOSIS — Z7982 Long term (current) use of aspirin: Secondary | ICD-10-CM | POA: Insufficient documentation

## 2020-02-11 DIAGNOSIS — N281 Cyst of kidney, acquired: Secondary | ICD-10-CM | POA: Diagnosis not present

## 2020-02-11 DIAGNOSIS — Z79899 Other long term (current) drug therapy: Secondary | ICD-10-CM | POA: Insufficient documentation

## 2020-02-11 DIAGNOSIS — R109 Unspecified abdominal pain: Secondary | ICD-10-CM | POA: Diagnosis present

## 2020-02-11 DIAGNOSIS — I10 Essential (primary) hypertension: Secondary | ICD-10-CM | POA: Insufficient documentation

## 2020-02-11 DIAGNOSIS — J45909 Unspecified asthma, uncomplicated: Secondary | ICD-10-CM | POA: Diagnosis not present

## 2020-02-11 DIAGNOSIS — Z9049 Acquired absence of other specified parts of digestive tract: Secondary | ICD-10-CM | POA: Diagnosis not present

## 2020-02-11 LAB — CBC
HCT: 45 % (ref 39.0–52.0)
Hemoglobin: 15.2 g/dL (ref 13.0–17.0)
MCH: 26.9 pg (ref 26.0–34.0)
MCHC: 33.8 g/dL (ref 30.0–36.0)
MCV: 79.5 fL — ABNORMAL LOW (ref 80.0–100.0)
Platelets: 249 10*3/uL (ref 150–400)
RBC: 5.66 MIL/uL (ref 4.22–5.81)
RDW: 14.8 % (ref 11.5–15.5)
WBC: 8.5 10*3/uL (ref 4.0–10.5)
nRBC: 0 % (ref 0.0–0.2)

## 2020-02-11 LAB — BASIC METABOLIC PANEL
Anion gap: 12 (ref 5–15)
BUN: 19 mg/dL (ref 6–20)
CO2: 23 mmol/L (ref 22–32)
Calcium: 8.6 mg/dL — ABNORMAL LOW (ref 8.9–10.3)
Chloride: 101 mmol/L (ref 98–111)
Creatinine, Ser: 0.68 mg/dL (ref 0.61–1.24)
GFR calc Af Amer: >60 mL/min (ref >60)
GFR calc non Af Amer: >60 mL/min (ref >60)
Glucose, Bld: 160 mg/dL — ABNORMAL HIGH (ref 70–99)
Potassium: 4.2 mmol/L (ref 3.5–5.1)
Sodium: 136 mmol/L (ref 135–145)

## 2020-02-11 MED ORDER — ONDANSETRON HCL 4 MG/2ML IJ SOLN
4.0000 mg | Freq: Once | INTRAMUSCULAR | Status: AC
  Administered 2020-02-11: 4 mg via INTRAVENOUS
  Filled 2020-02-11: qty 2

## 2020-02-11 MED ORDER — FENTANYL CITRATE (PF) 100 MCG/2ML IJ SOLN
50.0000 ug | INTRAMUSCULAR | Status: DC | PRN
  Administered 2020-02-11: 50 ug via INTRAVENOUS
  Filled 2020-02-11: qty 2

## 2020-02-11 MED ORDER — ACETAMINOPHEN 325 MG PO TABS
650.0000 mg | ORAL_TABLET | Freq: Once | ORAL | Status: AC
  Administered 2020-02-11: 650 mg via ORAL
  Filled 2020-02-11: qty 2

## 2020-02-11 NOTE — ED Notes (Signed)
Nausea improved, drinking coke in the lobby without further vomiting.

## 2020-02-11 NOTE — ED Triage Notes (Signed)
Reports n/v and left flank pain that started this morning.  Has not taken anything for pain.  Unable to eat or drink.

## 2020-02-12 ENCOUNTER — Encounter (HOSPITAL_BASED_OUTPATIENT_CLINIC_OR_DEPARTMENT_OTHER): Payer: Self-pay | Admitting: Emergency Medicine

## 2020-02-12 ENCOUNTER — Emergency Department (HOSPITAL_BASED_OUTPATIENT_CLINIC_OR_DEPARTMENT_OTHER): Payer: Medicare Other

## 2020-02-12 DIAGNOSIS — N281 Cyst of kidney, acquired: Secondary | ICD-10-CM | POA: Diagnosis not present

## 2020-02-12 DIAGNOSIS — Z9049 Acquired absence of other specified parts of digestive tract: Secondary | ICD-10-CM | POA: Diagnosis not present

## 2020-02-12 LAB — URINALYSIS, ROUTINE W REFLEX MICROSCOPIC
Bilirubin Urine: NEGATIVE
Glucose, UA: >=500 mg/dL — AB
Hgb urine dipstick: NEGATIVE
Ketones, ur: 15 mg/dL — AB
Leukocytes,Ua: NEGATIVE
Nitrite: NEGATIVE
Protein, ur: NEGATIVE mg/dL
Specific Gravity, Urine: 1.015 (ref 1.005–1.030)
pH: 6 (ref 5.0–8.0)

## 2020-02-12 LAB — URINALYSIS, MICROSCOPIC (REFLEX)

## 2020-02-12 MED ORDER — SODIUM CHLORIDE 0.9 % IV BOLUS
500.0000 mL | Freq: Once | INTRAVENOUS | Status: AC
  Administered 2020-02-12: 500 mL via INTRAVENOUS

## 2020-02-12 MED ORDER — KETOROLAC TROMETHAMINE 30 MG/ML IJ SOLN
30.0000 mg | Freq: Once | INTRAMUSCULAR | Status: AC
  Administered 2020-02-12: 30 mg via INTRAVENOUS
  Filled 2020-02-12: qty 1

## 2020-02-12 MED ORDER — CEPHALEXIN 250 MG PO CAPS
500.0000 mg | ORAL_CAPSULE | ORAL | Status: AC
  Administered 2020-02-12: 500 mg via ORAL
  Filled 2020-02-12: qty 2

## 2020-02-12 MED ORDER — CEPHALEXIN 500 MG PO CAPS
500.0000 mg | ORAL_CAPSULE | Freq: Four times a day (QID) | ORAL | 0 refills | Status: DC
Start: 2020-02-12 — End: 2020-03-13

## 2020-02-12 MED ORDER — ONDANSETRON HCL 4 MG PO TABS: 4.0000 mg | ORAL_TABLET | Freq: Three times a day (TID) | ORAL | 0 refills | Status: DC | PRN

## 2020-02-12 NOTE — ED Provider Notes (Addendum)
[facility] EMERGENCY DEPARTMENT Provider Note   CSN: [id number] Arrival date & time: 02/11/20  1937     History Chief Complaint  Patient presents with   Nausea   Emesis    [name] is a 49 y.o. male.  The history is provided by the patient.  Flank Pain This is a new problem. The current episode started 12 to 24 hours ago. The problem occurs constantly. The problem has not changed since onset.Pertinent negatives include no chest pain, no headaches and no shortness of breath. Nothing aggravates the symptoms. Nothing relieves the symptoms. He has tried nothing for the symptoms. The treatment provided no relief.  Flank pain with nausea and vomiting.  No f/c/r.  No diarrhea. No urinary complaints.       Past Medical History:  Diagnosis Date   Anginal pain (HCC)    Arthritis    ? 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 Followed by Dr. Nickola Major   Arthritis    "ankles" (04/13/2016)   Asthma    CORONARY ARTERY DISEASE    a. cath 05/2014: mild LAD disease, normal LCx and RCA. Risk factor modification recommended.   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)    MSLT 01/09/09 & MRI brain 01/09/09   Pericarditis    recurrent   POLYNEUROPATHY    PULMONARY SARCOIDOSIS    Mediastinal lymphadenopathy with biospy proven sarcodosis   Seizures (HCC)    "none in 4-5 years; don't know what kind; not related to alcohol" (04/13/2016)   TESTICULAR HYPOFUNCTION    TIA (transient ischemic attack)    "I don't remember when" (04/13/2016)    Patient Active Problem List   Diagnosis Date Noted   Irritable bowel syndrome with constipation 01/10/2020   Ejection fraction < 50% 09/26/2019   LVH (left ventricular hypertrophy) due to hypertensive disease, with heart failure (HCC) 09/26/2019   Routine general  medical examination at a health care facility 06/14/2018   Hepatic steatosis 11/03/2017   Primary narcolepsy without cataplexy 06/22/2017   Periodic limb movements of sleep 06/22/2017   Hypersomnia 06/22/2017   Nonintractable epilepsy with complex partial seizures (HCC) 03/23/2017   Drug-induced erectile dysfunction 11/06/2015   Body mass index (BMI) of 35.0 to 35.9 with comorbidity    Sarcoidosis (HCC) 09/29/2010   GERD 01/07/2010   Exercise-induced asthma 12/03/2009   POLYNEUROPATHY 10/17/2009   Male hypogonadism 10/08/2009   Diabetes mellitus type 2 in obese (HCC) 10/07/2009   Hyperlipidemia with target LDL less than 70 10/07/2009   Gout 10/07/2009   Essential hypertension 10/07/2009   CAD (coronary artery disease) 10/07/2009   Male erectile dysfunction 10/07/2009    Past Surgical History:  Procedure Laterality Date   BRONCHOSCOPY  08/21/08   CARDIAC CATHETERIZATION  06/25/2009   minimal disease   CARDIAC CATHETERIZATION N/A 04/14/2016   Procedure: Left Heart Cath and Coronary Angiography;  Surgeon: Kathleene Hazel, MD;  Location: [facility] INVASIVE CV LAB;  Service: Cardiovascular;  Laterality: N/A;   CHOLECYSTECTOMY N/A 09/21/2017   Procedure: LAPAROSCOPIC CHOLECYSTECTOMY WITH INTRAOPERATIVE CHOLANGIOGRAM;  Surgeon: Glenna Fellows, MD;  Location: [facility] ORS;  Service: General;  Laterality: N/A;   LEFT HEART CATHETERIZATION WITH CORONARY ANGIOGRAM N/A 05/18/2014   Procedure: LEFT HEART CATHETERIZATION WITH CORONARY ANGIOGRAM;  Surgeon: Corky Crafts, MD;  Location:  [facility] CATH LAB;  Service: Cardiovascular;  Laterality: N/A;   MEDIASTINOSCOPY  11/30/08       Family History  Problem Relation Age of Onset   Diabetes Mother    Hypertension Mother    Diabetes Father    Heart disease Father 35       Fatal MI   Heart failure Father     Social History   Tobacco Use   Smoking status: Former Smoker    Packs/day: 1.00    Years: 10.00    Pack  years: 10.00    Types: Cigarettes    Quit date: 05/18/2009    Years since quitting: 10.7   Smokeless tobacco: Never Used  Vaping Use   Vaping Use: Never used  Substance Use Topics   Alcohol use: Yes    Alcohol/week: 0.0 standard drinks    Comment: 04/13/2016 "glass of wine a couple times/month"   Drug use: No    Home Medications Prior to Admission medications   Medication Sig Start Date End Date Taking? Authorizing Provider  albuterol (VENTOLIN HFA) 108 (90 Base) MCG/ACT inhaler Inhale 2 puffs into the lungs every 6 (six) hours as needed for wheezing or shortness of breath. 07/12/17   [name], MD  allopurinol (ZYLOPRIM) 300 MG tablet TAKE 1 TABLET(300 MG) BY MOUTH DAILY 06/06/17   [name], MD  anastrozole (ARIMIDEX) 1 MG tablet Take 1 mg by mouth daily.    [provider]  aspirin EC 81 MG tablet Take 1 tablet (81 mg total) by mouth daily. 04/29/15   Newt Lukes, MD  cholecalciferol (VITAMIN D) 1000 units tablet Take 1,000 Units by mouth daily.    [provider]  clomiPHENE (CLOMID) 50 MG tablet SMARTSIG:0.5 Tablet(s) By Mouth Every Other Day 08/05/19   [provider]  colchicine 0.6 MG tablet Take 0.6 mg by mouth every 4 (four) hours as needed (gout pain).    [provider]  ezetimibe (ZETIA) 10 MG tablet Take 1 tablet (10 mg total) by mouth daily. 06/22/19   Corky Crafts, MD  folic acid (FOLVITE) 1 MG tablet TK 1 T PO QD 04/24/16   [provider]  glucose blood (ONETOUCH VERIO) test strip 1 each by Other route daily. And lancets 1/day Patient taking differently: 1 each by Other route daily.  02/16/18   Romero Belling, MD  irbesartan-hydrochlorothiazide (AVALIDE) 300-12.5 MG tablet TAKE 1 TABLET BY MOUTH DAILY 11/13/19   [name], MD  methotrexate (RHEUMATREX) 2.5 MG tablet Take 10 mg by mouth once a week. 08/20/19   [provider]  nitroGLYCERIN (NITROSTAT) 0.4 MG SL tablet Place 1 tablet (0.4  mg total) under the tongue every 5 (five) minutes as needed for chest pain. 06/28/17   Robbie Lis M, PA-C  Olopatadine HCl 0.2 % SOLN Apply 1 drop to eye daily. 01/28/18   Evaristo Bury, NP  omeprazole (PRILOSEC) 40 MG capsule TAKE 1 CAPSULE BY MOUTH TWICE DAILY 10/31/19   [name], MD  pioglitazone (ACTOS) 30 MG tablet Take 1 tablet (30 mg total) by mouth daily. 04/26/19   Romero Belling, MD  Plecanatide (TRULANCE) 3 MG TABS Take 1 tablet by mouth daily. 01/10/20   [name], MD  potassium chloride SA (KLOR-CON) 20 MEQ tablet TAKE 1 TABLET(20 MEQ) BY MOUTH TWICE DAILY 10/27/19   [name], MD  repaglinide (PRANDIN) 0.5 MG tablet Take 1 tablet (0.5 mg total) by mouth 3 (three) times daily  before meals. 02/23/19   Romero Belling, MD  rosuvastatin (CRESTOR) 40 MG tablet TAKE 1 TABLET(40 MG) BY MOUTH DAILY 10/31/19   [name], MD  saw palmetto 500 MG capsule Take 500 mg by mouth daily.    [provider]  sucralfate (CARAFATE) 1 g tablet TAKE 1 TABLET(1 GRAM) BY MOUTH FOUR TIMES DAILY AT BEDTIME WITH MEALS 03/28/18   Meryl Dare, MD  SYNJARDY XR 12.10-998 MG TB24 TAKE 2 TABLETS BY MOUTH DAILY 12/03/19   [name], MD  tadalafil (CIALIS) 20 MG tablet TAKE ONE TABLET BY MOUTH DAILY AS NEEDED 12/27/19   McKenzie, Mardene Celeste, MD  tamsulosin (FLOMAX) 0.4 MG CAPS capsule Take 0.4 mg by mouth daily.    McKenzie, Mardene Celeste, MD  torsemide (DEMADEX) 20 MG tablet TAKE 1 TABLET(20 MG) BY MOUTH TWICE DAILY 09/14/19   Lars Masson, MD  Empagliflozin-metFORMIN HCl ER (SYNJARDY XR) 12.10-998 MG TB24 Take 2 tablets by mouth daily. 06/22/19   [name], MD    Allergies    Amlodipine, Sulfamethoxazole, and Sulfonamide derivatives  Review of Systems   Review of Systems  Constitutional: Negative for fever.  HENT: Negative for congestion.   Eyes: Negative for visual disturbance.  Respiratory: Negative for shortness of breath.   Cardiovascular: Negative for  chest pain.  Gastrointestinal: Positive for nausea and vomiting. Negative for diarrhea.  Genitourinary: Positive for flank pain. Negative for dysuria.  Musculoskeletal: Negative for arthralgias.  Skin: Negative for pallor.  Neurological: Negative for headaches.  Psychiatric/Behavioral: Negative for agitation.  All other systems reviewed and are negative.   Physical Exam Updated Vital Signs BP 105/65 (BP Location: Right Arm)    Pulse 94    Temp 98.6 F (37 C)    Resp 16    Ht 6\' 3"  (1.905 m)    Wt 127.9 kg    SpO2 98%    BMI 35.25 kg/m   Physical Exam Vitals and nursing note reviewed.  Constitutional:      General: He is not in acute distress.    Appearance: Normal appearance.  HENT:     Head: Normocephalic and atraumatic.     Nose: Nose normal.  Eyes:     Conjunctiva/sclera: Conjunctivae normal.     Pupils: Pupils are equal, round, and reactive to light.  Cardiovascular:     Rate and Rhythm: Normal rate and regular rhythm.     Pulses: Normal pulses.     Heart sounds: Normal heart sounds.  Pulmonary:     Effort: Pulmonary effort is normal.     Breath sounds: Normal breath sounds.  Abdominal:     General: Abdomen is flat. Bowel sounds are normal.     Palpations: Abdomen is soft.     Tenderness: There is no abdominal tenderness. There is no guarding.  Musculoskeletal:        General: Normal range of motion.     Cervical back: Normal range of motion and neck supple.  Skin:    General: Skin is warm and dry.     Capillary Refill: Capillary refill takes less than 2 seconds.  Neurological:     General: No focal deficit present.     Mental Status: He is alert and oriented to person, place, and time.  Psychiatric:        Mood and Affect: Mood normal.        Behavior: Behavior normal.     ED Results / Procedures / Treatments   Labs (  all labs ordered are listed, but only abnormal results are displayed) Results for orders placed or performed during the hospital encounter of  02/11/20  Urinalysis, Routine w reflex microscopic Urine, Clean Catch  Result Value Ref Range   Color, Urine YELLOW YELLOW   APPearance CLEAR CLEAR   Specific Gravity, Urine 1.015 1.005 - 1.030   pH 6.0 5.0 - 8.0   Glucose, UA >=500 (A) NEGATIVE mg/dL   Hgb urine dipstick NEGATIVE NEGATIVE   Bilirubin Urine NEGATIVE NEGATIVE   Ketones, ur 15 (A) NEGATIVE mg/dL   Protein, ur NEGATIVE NEGATIVE mg/dL   Nitrite NEGATIVE NEGATIVE   Leukocytes,Ua NEGATIVE NEGATIVE  CBC  Result Value Ref Range   WBC 8.5 4.0 - 10.5 K/uL   RBC 5.66 4.22 - 5.81 MIL/uL   Hemoglobin 15.2 13.0 - 17.0 g/dL   HCT 09.3 39 - 52 %   MCV 79.5 (L) 80.0 - 100.0 fL   MCH 26.9 26.0 - 34.0 pg   MCHC 33.8 30.0 - 36.0 g/dL   RDW 23.5 57.3 - 22.0 %   Platelets 249 150 - 400 K/uL   nRBC 0.0 0.0 - 0.2 %  Basic metabolic panel  Result Value Ref Range   Sodium 136 135 - 145 mmol/L   Potassium 4.2 3.5 - 5.1 mmol/L   Chloride 101 98 - 111 mmol/L   CO2 23 22 - 32 mmol/L   Glucose, Bld 160 (H) 70 - 99 mg/dL   BUN 19 6 - 20 mg/dL   Creatinine, Ser 2.54 0.61 - 1.24 mg/dL   Calcium 8.6 (L) 8.9 - 10.3 mg/dL   GFR calc non Af Amer >60 >60 mL/min   GFR calc Af Amer >60 >60 mL/min   Anion gap 12 5 - 15  Urinalysis, Microscopic (reflex)  Result Value Ref Range   RBC / HPF 0-5 0 - 5 RBC/hpf   WBC, UA 0-5 0 - 5 WBC/hpf   Bacteria, UA RARE (A) NONE SEEN   Squamous Epithelial / LPF 0-5 0 - 5   CT Chest High Resolution  Result Date: 02/07/2020 CLINICAL DATA:  Evaluate for I LD, history of sarcoidosis and rheumatoid arthritis EXAM: CT CHEST WITHOUT CONTRAST TECHNIQUE: Multidetector CT imaging of the chest was performed following the standard protocol without intravenous contrast. High resolution imaging of the lungs, as well as inspiratory and expiratory imaging, was performed. COMPARISON:  None. FINDINGS: Cardiovascular: Scattered aortic atherosclerosis. Normal heart size. Left coronary artery calcifications. No pericardial effusion.  Mediastinum/Nodes: No enlarged mediastinal, hilar, or axillary lymph nodes. Thyroid gland, trachea, and esophagus demonstrate no significant findings. Lungs/Pleura: Occasional tiny pulmonary nodules, the largest a 3 mm pulmonary nodule of the right pulmonary apex (series 5, image 47). No evidence of fibrotic interstitial lung disease. No significant air trapping on expiratory phase imaging. No pleural effusion or pneumothorax. Upper Abdomen: No acute abnormality. Musculoskeletal: No chest wall mass or suspicious bone lesions identified. IMPRESSION: 1. No evidence of fibrotic interstitial lung disease. 2. Occasional tiny pulmonary nodules, the largest a 3 mm pulmonary nodule of the right pulmonary apex. No follow-up needed if patient is low-risk (and has no known or suspected primary neoplasm). Non-contrast chest CT can be considered in 12 months if patient is high-risk. This recommendation follows the consensus statement: Guidelines for Management of Incidental Pulmonary Nodules Detected on CT Images: From the Fleischner Society 2017; Radiology 2017; 284:228-243. 3. Coronary artery disease.  Aortic Atherosclerosis (ICD10-I70.0). Electronically Signed   By: Lauralyn Primes M.D.   On:  02/07/2020 12:05   CT Renal Stone Study  Result Date: 02/12/2020 CLINICAL DATA:  Left flank pain EXAM: CT ABDOMEN AND PELVIS WITHOUT CONTRAST TECHNIQUE: Multidetector CT imaging of the abdomen and pelvis was performed following the standard protocol without IV contrast. COMPARISON:  01/09/2020 FINDINGS: Lower chest: Lung bases are clear. No effusions. Heart is normal size. Hepatobiliary: Prior cholecystectomy. Subtle micro nodularity to the liver surface suggests the possibility of cirrhosis. Recommend clinical correlation. No focal hepatic abnormality. Pancreas: No focal abnormality or ductal dilatation. Spleen: No focal abnormality.  Normal size. Adrenals/Urinary Tract: No renal or ureteral stones. No hydronephrosis. Small exophytic  cyst off the midpole of the right kidney. Adrenal glands and urinary bladder unremarkable. Stomach/Bowel: Normal appendix. Stomach, large and small bowel grossly unremarkable. Vascular/Lymphatic: No evidence of aneurysm or adenopathy. Reproductive: No visible focal abnormality. Other: No free fluid or free air. Musculoskeletal: No acute bony abnormality. IMPRESSION: No renal or ureteral stones.  No hydronephrosis. Subtle nodular appearance of the liver surface suggests the possibility of cirrhosis. Recommend clinical correlation. No acute findings in the abdomen or pelvis. Electronically Signed   By: Charlett Nose M.D.   On: 02/12/2020 01:53    EKG None  Radiology CT Renal Stone Study  Result Date: 02/12/2020 CLINICAL DATA:  Left flank pain EXAM: CT ABDOMEN AND PELVIS WITHOUT CONTRAST TECHNIQUE: Multidetector CT imaging of the abdomen and pelvis was performed following the standard protocol without IV contrast. COMPARISON:  01/09/2020 FINDINGS: Lower chest: Lung bases are clear. No effusions. Heart is normal size. Hepatobiliary: Prior cholecystectomy. Subtle micro nodularity to the liver surface suggests the possibility of cirrhosis. Recommend clinical correlation. No focal hepatic abnormality. Pancreas: No focal abnormality or ductal dilatation. Spleen: No focal abnormality.  Normal size. Adrenals/Urinary Tract: No renal or ureteral stones. No hydronephrosis. Small exophytic cyst off the midpole of the right kidney. Adrenal glands and urinary bladder unremarkable. Stomach/Bowel: Normal appendix. Stomach, large and small bowel grossly unremarkable. Vascular/Lymphatic: No evidence of aneurysm or adenopathy. Reproductive: No visible focal abnormality. Other: No free fluid or free air. Musculoskeletal: No acute bony abnormality. IMPRESSION: No renal or ureteral stones.  No hydronephrosis. Subtle nodular appearance of the liver surface suggests the possibility of cirrhosis. Recommend clinical correlation. No  acute findings in the abdomen or pelvis. Electronically Signed   By: Charlett Nose M.D.   On: 02/12/2020 01:53    Procedures Procedures (including critical care time)  Medications Ordered in ED Medications  cephALEXin (KEFLEX) capsule 500 mg (has no administration in time range)  ondansetron (ZOFRAN) injection 4 mg (4 mg Intravenous Given 02/11/20 2004)  acetaminophen (TYLENOL) tablet 650 mg (650 mg Oral Given 02/11/20 2323)  ketorolac (TORADOL) 30 MG/ML injection 30 mg (30 mg Intravenous Given 02/12/20 0136)    ED Course  I have reviewed the triage vital signs and the nursing notes.  Pertinent labs & imaging results that were available during my care of the patient were reviewed by me and considered in my medical decision making (see chart for details).   Patient is angry that he is not getting electrolyte replacement.  I have explained to the patient that his electrolytes are normal.  He is angry that all he got is pain medication.  EDP explained the nurse would be bringing antibiotics for the UTI.  He states this cannot be a UTI as his wife does not get vomiting with hers.  EDP explained that every experiences illness differently.  He does not believe nothing else is wrong.  EDP  politely explained we did a CT scan to look at all internal organs and no acute finding was found.  He said "we will see, I'll just have to come back."     I will treat for UTI, cultures sent.  No stones on CT. CT is normal no pyelonephritis.  Exam and labs and vitals are benign and reassuring.  Strict return precautions given.    BRAYCEN SUTHERLAND was evaluated in Emergency Department on 02/12/2020 for the symptoms described in the history of present illness. He was evaluated in the context of the global COVID-19 pandemic, which necessitated consideration that the patient might be at risk for infection with the SARS-CoV-2 virus that causes COVID-19. Institutional protocols and algorithms that pertain to the evaluation of  patients at risk for COVID-19 are in a state of rapid change based on information released by regulatory bodies including the CDC and federal and state organizations. These policies and algorithms were followed during the patient's care in the ED.  Final Clinical Impression(s) / ED Diagnoses  Return for intractable cough, coughing up blood,fevers >100.4 unrelieved by medication, shortness of breath, intractable vomiting, chest pain, shortness of breath, weakness,numbness, changes in speech, facial asymmetry,abdominal pain, passing out,Inability to tolerate liquids or food, cough, altered mental status or any concerns. No signs of systemic illness or infection. The patient is nontoxic-appearing on exam and vital signs are within normal limits.   I have reviewed the triage vital signs and the nursing notes. Pertinent labs &imaging results that were available during my care of the patient were reviewed by me and considered in my medical decision making (see chart for details).After history, exam, and medical workup I feel the patient has beenappropriately medically screened and is safe for discharge home. Pertinent diagnoses were discussed with the patient. Patient was given return precautions.    Adden Strout, MD 02/12/20 [id number]    Cy Blamer, MD 02/12/20 0350

## 2020-02-12 NOTE — Discharge Instructions (Signed)
Prescription for nausea medication and antibiotics sent to pharmacy

## 2020-02-13 LAB — URINE CULTURE: Culture: NO GROWTH

## 2020-02-28 ENCOUNTER — Other Ambulatory Visit: Payer: Self-pay | Admitting: Internal Medicine

## 2020-02-28 ENCOUNTER — Other Ambulatory Visit: Payer: Self-pay | Admitting: Urology

## 2020-02-28 DIAGNOSIS — I10 Essential (primary) hypertension: Secondary | ICD-10-CM

## 2020-02-29 ENCOUNTER — Other Ambulatory Visit: Payer: Self-pay

## 2020-02-29 ENCOUNTER — Ambulatory Visit: Payer: Medicare Other | Admitting: Pharmacist

## 2020-02-29 DIAGNOSIS — K581 Irritable bowel syndrome with constipation: Secondary | ICD-10-CM

## 2020-02-29 DIAGNOSIS — E1169 Type 2 diabetes mellitus with other specified complication: Secondary | ICD-10-CM

## 2020-02-29 DIAGNOSIS — E785 Hyperlipidemia, unspecified: Secondary | ICD-10-CM

## 2020-02-29 DIAGNOSIS — I10 Essential (primary) hypertension: Secondary | ICD-10-CM

## 2020-02-29 DIAGNOSIS — I25118 Atherosclerotic heart disease of native coronary artery with other forms of angina pectoris: Secondary | ICD-10-CM

## 2020-02-29 NOTE — Patient Instructions (Addendum)
Visit Information  Phone number for Pharmacist: [phone]  Goals Addressed            This Visit's Progress   . Pharmacy Care Plan       CARE PLAN ENTRY (see longitudinal plan of care for additional care plan information)  Current Barriers:  . Chronic Disease Management support, education, and care coordination needs related to Hypertension, Hyperlipidemia, Diabetes, and Constipation   Hypertension BP Readings from Last 3 Encounters:  02/12/20 105/65  01/25/20 126/76  01/10/20 112/70 .  Pharmacist Clinical Goal(s): o Over the next 180 days, patient will work with PharmD and providers to maintain BP goal <130/80 . Current regimen:  o irbesartan-HCTZ 300-12.5 mg daily,  o torsemide 20 mg daily,  o potassium chloride 20 meq - 2 tabs daily  . Interventions: o Discussed BP goals and benefits of medications for prevention of heart attack / stroke . Patient self care activities - Over the next 180 days, patient will: o Check BP daily, document, and provide at future appointments o Ensure daily salt intake < 2300 mg/day  Hyperlipidemia Lab Results  Component Value Date/Time   LDLCALC 37 09/20/2019 07:46 AM .  Pharmacist Clinical Goal(s): o Over the next 180 days, patient will work with PharmD and providers to maintain LDL goal < 70 . Current regimen:  o rosuvastatin 40 mg daily,  o ezetimibe 10 mg daily,  o nitroglycerin 0.4 mg as needed o aspirin 81 mg daily    . Interventions: o Discussed cholesterol goals and benefits of medications for prevention of heart attack / stroke . Patient self care activities - Over the next 180 days, patient will: o Continue medication as prescribed  Diabetes Lab Results  Component Value Date/Time   HGBA1C 7.5 (A) 01/10/2020 03:56 PM   HGBA1C 6.8 (H) 08/29/2019 08:47 AM   HGBA1C 6.4 (A) 04/24/2019 08:03 AM   HGBA1C 7.2 (H) 06/29/2017 09:23 AM .  Pharmacist Clinical Goal(s): o Over the next 180 days, patient will work with PharmD and  providers to achieve A1c goal <7% . Current regimen:  o Synjardy 12.10-998 mg - 2 tablets daily,  o pioglitazone 30 mg daily,  o repaglinide 0.5 mg TID with meals  . Interventions: o Discussed blood sugar goals and benefits of medications for prevention of diabetic complications o Discussed history of pancreatitis limits use of other classes of diabetes medications (GLP-1 and DPP-IV) o Discussed ways to improve diet  . Patient self care activities - Over the next 180 days, patient will: o Check blood sugar once daily and in the morning before eating or drinking, document, and provide at future appointments o Contact provider with any episodes of hypoglycemia o Switch to diet sodas/juices and drink more water  Constipation . Pharmacist Clinical Goal(s) o Over the next 180 days, patient will work with PharmD and providers to optimize therapy . Current regimen:  o Trulance 3 mg as needed o Senna-docusate as needed . Interventions: o Discussed benefits of taking daily stool softener and increasing fiber intake . Patient self care activities - Over the next 180 days, patient will: o Take stool softener daily and increase fiber intake  Medication management . Pharmacist Clinical Goal(s): o Over the next 180 days, patient will work with PharmD and providers to maintain optimal medication adherence . Current pharmacy: [facility] . Interventions o Comprehensive medication review performed. o Continue current medication management strategy . Patient self care activities - Over the next 180 days, patient will: o Focus on  medication adherence by pill box o Take medications as prescribed o Report any questions or concerns to PharmD and/or provider(s)  Please see past updates related to this goal by clicking on the "Past Updates" button in the selected goal       Patient verbalizes understanding of instructions provided today.  Telephone follow up appointment with pharmacy team member  scheduled for: 6 months  Al Corpus, PharmD, BCACP Clinical Pharmacist [facility] [phone]  Diabetes Mellitus and Nutrition, Adult When you have diabetes (diabetes mellitus), it is very important to have healthy eating habits because your blood sugar (glucose) levels are greatly affected by what you eat and drink. Eating healthy foods in the appropriate amounts, at about the same times every day, can help you:  Control your blood glucose.  Lower your risk of heart disease.  Improve your blood pressure.  Reach or maintain a healthy weight. Every person with diabetes is different, and each person has different needs for a meal plan. Your health care provider may recommend that you work with a diet and nutrition specialist (dietitian) to make a meal plan that is best for you. Your meal plan may vary depending on factors such as:  The calories you need.  The medicines you take.  Your weight.  Your blood glucose, blood pressure, and cholesterol levels.  Your activity level.  Other health conditions you have, such as heart or kidney disease. How do carbohydrates affect me? Carbohydrates, also called carbs, affect your blood glucose level more than any other type of food. Eating carbs naturally raises the amount of glucose in your blood. Carb counting is a method for keeping track of how many carbs you eat. Counting carbs is important to keep your blood glucose at a healthy level, especially if you use insulin or take certain oral diabetes medicines. It is important to know how many carbs you can safely have in each meal. This is different for every person. Your dietitian can help you calculate how many carbs you should have at each meal and for each snack. Foods that contain carbs include:  Bread, cereal, rice, pasta, and crackers.  Potatoes and corn.  Peas, beans, and lentils.  Milk and yogurt.  Fruit and juice.  Desserts, such as cakes,  cookies, ice cream, and candy. How does alcohol affect me? Alcohol can cause a sudden decrease in blood glucose (hypoglycemia), especially if you use insulin or take certain oral diabetes medicines. Hypoglycemia can be a life-threatening condition. Symptoms of hypoglycemia (sleepiness, dizziness, and confusion) are similar to symptoms of having too much alcohol. If your health care provider says that alcohol is safe for you, follow these guidelines:  Limit alcohol intake to no more than 1 drink per day for nonpregnant women and 2 drinks per day for men. One drink equals 12 oz of beer, 5 oz of wine, or 1 oz of hard liquor.  Do not drink on an empty stomach.  Keep yourself hydrated with water, diet soda, or unsweetened iced tea.  Keep in mind that regular soda, juice, and other mixers may contain a lot of sugar and must be counted as carbs. What are tips for following this plan?  Reading food labels  Start by checking the serving size on the "Nutrition Facts" label of packaged foods and drinks. The amount of calories, carbs, fats, and other nutrients listed on the label is based on one serving of the item. Many items contain more than one serving per  package.  Check the total grams (g) of carbs in one serving. You can calculate the number of servings of carbs in one serving by dividing the total carbs by 15. For example, if a food has 30 g of total carbs, it would be equal to 2 servings of carbs.  Check the number of grams (g) of saturated and trans fats in one serving. Choose foods that have low or no amount of these fats.  Check the number of milligrams (mg) of salt (sodium) in one serving. Most people should limit total sodium intake to less than 2,300 mg per day.  Always check the nutrition information of foods labeled as "low-fat" or "nonfat". These foods may be higher in added sugar or refined carbs and should be avoided.  Talk to your dietitian to identify your daily goals for  nutrients listed on the label. Shopping  Avoid buying canned, premade, or processed foods. These foods tend to be high in fat, sodium, and added sugar.  Shop around the outside edge of the grocery store. This includes fresh fruits and vegetables, bulk grains, fresh meats, and fresh dairy. Cooking  Use low-heat cooking methods, such as baking, instead of high-heat cooking methods like deep frying.  Cook using healthy oils, such as olive, canola, or sunflower oil.  Avoid cooking with butter, cream, or high-fat meats. Meal planning  Eat meals and snacks regularly, preferably at the same times every day. Avoid going long periods of time without eating.  Eat foods high in fiber, such as fresh fruits, vegetables, beans, and whole grains. Talk to your dietitian about how many servings of carbs you can eat at each meal.  Eat 4-6 ounces (oz) of lean protein each day, such as lean meat, chicken, fish, eggs, or tofu. One oz of lean protein is equal to: ? 1 oz of meat, chicken, or fish. ? 1 egg. ?  cup of tofu.  Eat some foods each day that contain healthy fats, such as avocado, nuts, seeds, and fish. Lifestyle  Check your blood glucose regularly.  Exercise regularly as told by your health care provider. This may include: ? 150 minutes of moderate-intensity or vigorous-intensity exercise each week. This could be brisk walking, biking, or water aerobics. ? Stretching and doing strength exercises, such as yoga or weightlifting, at least 2 times a week.  Take medicines as told by your health care provider.  Do not use any products that contain nicotine or tobacco, such as cigarettes and e-cigarettes. If you need help quitting, ask your health care provider.  Work with a Veterinary surgeon or diabetes educator to identify strategies to manage stress and any emotional and social challenges. Questions to ask a health care provider  Do I need to meet with a diabetes educator?  Do I need to meet with a  dietitian?  What number can I call if I have questions?  When are the best times to check my blood glucose? Where to find more information:  American Diabetes Association: diabetes.org  Academy of Nutrition and Dietetics: www.eatright.[employer] of Diabetes and Digestive and Kidney Diseases (NIH): CarFli[employer].tn Summary  A healthy meal plan will help you control your blood glucose and maintain a healthy lifestyle.  Working with a diet and nutrition specialist (dietitian) can help you make a meal plan that is best for you.  Keep in mind that carbohydrates (carbs) and alcohol have immediate effects on your blood glucose levels. It is important to count carbs and to use alcohol  carefully. This information is not intended to replace advice given to you by your health care provider. Make sure you discuss any questions you have with your health care provider. Document Revised: 05/07/2017 Document Reviewed: 06/29/2016 Elsevier Patient Education  2020 [employer].

## 2020-02-29 NOTE — Chronic Care Management (AMB) (Signed)
Chronic Care Management Pharmacy  Name: [name]  MRN: [id number] DOB: August 05, 1970   Chief Complaint/ HPI  [name],  49 y.o. , male presents for their Follow-Up CCM visit with the clinical pharmacist via telephone due to COVID-19 Pandemic.  PCP : Xavier Grandchild, MD  Patient Care Team: Xavier Grandchild, MD as PCP - General (Internal Medicine) Lars Masson, MD as PCP - Cardiology (Cardiology) Virgilio Frees as Consulting Physician (Rheumatology) Loyal Gambler, MD as Referring Physician (Rheumatology) [name], MD as Consulting Physician (Pulmonary Disease) Romero Belling, MD as Consulting Physician (Endocrinology) Lars Masson, MD (Cardiology) Kathyrn Sheriff, [facility] as Pharmacist (Pharmacist)  Their chronic conditions include: HTN, nonobstructive CAD (2015), T2DM, HLD, GERD, gout, exercise-induced asthma, epilepsy   Patient is from [place], has lived in [place] since 1997, lives with fiance. He reports weight is his biggest health issue.  Office Visits: 01/10/20 Dr Yetta Barre OV: ED f/u. A1c up to 7.5%, rec'd lifestyle modifications. IBS not improved with Bentyl. Try Trulance.  09/26/19 Dr Yetta Barre OV: EF < 50% rec'd ECHO. EF 55-60%, no major concerns.  08/29/19 Dr Yetta Barre OV: chronic f/u, conditions stable. Ordered myocardial perfusion scan which showed EF < 50% but no obstructive disease.  Consult Visit: 02/11/20 ED visit: nausea/vomiting. Blood work, CT scan reassuring. Treated for UTI with Keflex.  01/25/20 Dr Marchelle Gearing (Pulmonary): sarcoid flare, re-establishing care (last 3 years ago). Flare may be sarcoid, obesity, diastolic dysfunction, or asthma. Ordered CBC, HRCT, PFT, and 3rd Covid booster.  01/09/20 ED visit: RLQ pain, ruled out gastroenteritis, appendicitis, treated symptomatically with morphine and zofran. Discharged with Bentyl.  06/20/19 PA Lenze (cardiology): No chest pain, encouraged exercise. Had palpitations in June 2020, holding off on  restarting a BB due to controlled BP and no recent palpitations.  04/24/19 Dr Everardo All (endocrine): uses Synjardy samples. Declines Brand name meds d/t cost. No med changes.   02/23/19 Dr Everardo All (endocrine): added repaglinide due to A1c 8.2.   Medications: Outpatient Encounter Medications as of 02/29/2020  Medication Sig  . albuterol (VENTOLIN HFA) 108 (90 Base) MCG/ACT inhaler Inhale 2 puffs into the lungs every 6 (six) hours as needed for wheezing or shortness of breath.  . allopurinol (ZYLOPRIM) 300 MG tablet TAKE 1 TABLET(300 MG) BY MOUTH DAILY  . anastrozole (ARIMIDEX) 1 MG tablet Take 1 mg by mouth daily.  Marland Kitchen aspirin EC 81 MG tablet Take 1 tablet (81 mg total) by mouth daily.  . cephALEXin (KEFLEX) 500 MG capsule Take 1 capsule (500 mg total) by mouth 4 (four) times daily.  . cholecalciferol (VITAMIN D) 1000 units tablet Take 1,000 Units by mouth daily.  . clomiPHENE (CLOMID) 50 MG tablet SMARTSIG:0.5 Tablet(s) By Mouth Every Other Day  . colchicine 0.6 MG tablet Take 0.6 mg by mouth every 4 (four) hours as needed (gout pain).  Marland Kitchen ezetimibe (ZETIA) 10 MG tablet Take 1 tablet (10 mg total) by mouth daily.  . folic acid (FOLVITE) 1 MG tablet TK 1 T PO QD  . glucose blood (ONETOUCH VERIO) test strip 1 each by Other route daily. And lancets 1/day (Patient taking differently: 1 each by Other route daily. )  . irbesartan-hydrochlorothiazide (AVALIDE) 300-12.5 MG tablet TAKE 1 TABLET BY MOUTH DAILY  . methotrexate (RHEUMATREX) 2.5 MG tablet Take 10 mg by mouth once a week.  . nitroGLYCERIN (NITROSTAT) 0.4 MG SL tablet Place 1 tablet (0.4 mg total) under the tongue every 5 (five) minutes as needed for chest pain.  Marland Kitchen  Olopatadine HCl 0.2 % SOLN Apply 1 drop to eye daily.  Marland Kitchen omeprazole (PRILOSEC) 40 MG capsule TAKE 1 CAPSULE BY MOUTH TWICE DAILY  . ondansetron (ZOFRAN) 4 MG tablet Take 1 tablet (4 mg total) by mouth every 8 (eight) hours as needed for nausea or vomiting.  . pioglitazone (ACTOS) 30  MG tablet Take 1 tablet (30 mg total) by mouth daily.  Marland Kitchen Plecanatide (TRULANCE) 3 MG TABS Take 1 tablet by mouth daily.  . potassium chloride SA (KLOR-CON) 20 MEQ tablet TAKE 1 TABLET(20 MEQ) BY MOUTH TWICE DAILY  . repaglinide (PRANDIN) 0.5 MG tablet Take 1 tablet (0.5 mg total) by mouth 3 (three) times daily before meals.  . rosuvastatin (CRESTOR) 40 MG tablet TAKE 1 TABLET(40 MG) BY MOUTH DAILY  . saw palmetto 500 MG capsule Take 500 mg by mouth daily.  . sucralfate (CARAFATE) 1 g tablet TAKE 1 TABLET(1 GRAM) BY MOUTH FOUR TIMES DAILY AT BEDTIME WITH MEALS  . SYNJARDY XR 12.10-998 MG TB24 TAKE 2 TABLETS BY MOUTH DAILY  . tadalafil (CIALIS) 20 MG tablet TAKE ONE TABLET BY MOUTH DAILY AS NEEDED  . tamsulosin (FLOMAX) 0.4 MG CAPS capsule Take 0.4 mg by mouth daily.  Marland Kitchen torsemide (DEMADEX) 20 MG tablet TAKE 1 TABLET(20 MG) BY MOUTH TWICE DAILY  . [DISCONTINUED] Empagliflozin-metFORMIN HCl ER (SYNJARDY XR) 12.10-998 MG TB24 Take 2 tablets by mouth daily.   No facility-administered encounter medications on file as of 02/29/2020.     Current Diagnosis/Assessment:  Goals Addressed            This Visit's Progress   . Pharmacy Care Plan       CARE PLAN ENTRY (see longitudinal plan of care for additional care plan information)  Current Barriers:  . Chronic Disease Management support, education, and care coordination needs related to Hypertension, Hyperlipidemia, Diabetes, and Constipation   Hypertension BP Readings from Last 3 Encounters:  02/12/20 105/65  01/25/20 126/76  01/10/20 112/70 .  Pharmacist Clinical Goal(s): o Over the next 180 days, patient will work with PharmD and providers to maintain BP goal <130/80 . Current regimen:  o irbesartan-HCTZ 300-12.5 mg daily,  o torsemide 20 mg daily,  o potassium chloride 20 meq - 2 tabs daily  . Interventions: o Discussed BP goals and benefits of medications for prevention of heart attack / stroke . Patient self care activities -  Over the next 180 days, patient will: o Check BP daily, document, and provide at future appointments o Ensure daily salt intake < 2300 mg/day  Hyperlipidemia Lab Results  Component Value Date/Time   LDLCALC 37 09/20/2019 07:46 AM .  Pharmacist Clinical Goal(s): o Over the next 180 days, patient will work with PharmD and providers to maintain LDL goal < 70 . Current regimen:  o rosuvastatin 40 mg daily,  o ezetimibe 10 mg daily,  o nitroglycerin 0.4 mg as needed o aspirin 81 mg daily    . Interventions: o Discussed cholesterol goals and benefits of medications for prevention of heart attack / stroke . Patient self care activities - Over the next 180 days, patient will: o Continue medication as prescribed  Diabetes Lab Results  Component Value Date/Time   HGBA1C 7.5 (A) 01/10/2020 03:56 PM   HGBA1C 6.8 (H) 08/29/2019 08:47 AM   HGBA1C 6.4 (A) 04/24/2019 08:03 AM   HGBA1C 7.2 (H) 06/29/2017 09:23 AM .  Pharmacist Clinical Goal(s): o Over the next 180 days, patient will work with PharmD and providers to achieve A1c  goal <7% . Current regimen:  o Synjardy 12.10-998 mg - 2 tablets daily,  o pioglitazone 30 mg daily,  o repaglinide 0.5 mg TID with meals  . Interventions: o Discussed blood sugar goals and benefits of medications for prevention of diabetic complications o Discussed history of pancreatitis limits use of other classes of diabetes medications (GLP-1 and DPP-IV) o Discussed ways to improve diet  . Patient self care activities - Over the next 180 days, patient will: o Check blood sugar once daily and in the morning before eating or drinking, document, and provide at future appointments o Contact provider with any episodes of hypoglycemia o Switch to diet sodas/juices and drink more water  Constipation . Pharmacist Clinical Goal(s) o Over the next 180 days, patient will work with PharmD and providers to optimize therapy . Current regimen:  o Trulance 3 mg as  needed o Senna-docusate as needed . Interventions: o Discussed benefits of taking daily stool softener and increasing fiber intake . Patient self care activities - Over the next 180 days, patient will: o Take stool softener daily and increase fiber intake  Medication management . Pharmacist Clinical Goal(s): o Over the next 180 days, patient will work with PharmD and providers to maintain optimal medication adherence . Current pharmacy: [facility] . Interventions o Comprehensive medication review performed. o Continue current medication management strategy . Patient self care activities - Over the next 180 days, patient will: o Focus on medication adherence by pill box o Take medications as prescribed o Report any questions or concerns to PharmD and/or provider(s)  Please see past updates related to this goal by clicking on the "Past Updates" button in the selected goal        Diabetes   A1c goal < 7%  Recent Relevant Labs: Lab Results  Component Value Date/Time   HGBA1C 7.5 (A) 01/10/2020 03:56 PM   HGBA1C 6.8 (H) 08/29/2019 08:47 AM   HGBA1C 6.4 (A) 04/24/2019 08:03 AM   HGBA1C 7.2 (H) 06/29/2017 09:23 AM   MICROALBUR 1.0 08/29/2019 08:47 AM   MICROALBUR <0.7 06/14/2018 03:56 PM    Last diabetic Foot exam:  Lab Results  Component Value Date/Time   HMDIABEYEEXA No Retinopathy 10/26/2019 12:00 AM    Last diabetic Eye exam:  Lab Results  Component Value Date/Time   HMDIABFOOTEX Done 04/10/2010 12:00 AM    Checking BG: daily  Recent FBG Readings: 120-160  Patient has failed these meds in past: Invokana, Farxiga, Jardiance, [name] Patient is currently controlled on the following medications:   Synjardy 12.10-998 mg - 2 tablets daily,   pioglitazone 30 mg daily,   repaglinide 0.5 mg TID with meals   We discussed: diet and exercise extensively, A1c has increased; pt asked about other classes of medication - history of pancreatitis limits use of GLP-1 and DPP-IV  drugs. Encouraged patient to reduce carbs in diet by switching to diet sodas/juices and drinking more water  Plan  Continue current medications and control with diet and exercise   Hypertension   BP goal is:  <130/80  Office blood pressures are  BP Readings from Last 3 Encounters:  02/12/20 105/65  01/25/20 126/76  01/10/20 112/70   Kidney Function Lab Results  Component Value Date/Time   CREATININE 0.68 02/11/2020 07:54 PM   CREATININE 0.75 01/09/2020 08:11 AM   CREATININE 0.91 04/07/2016 03:29 PM   GFR 106.83 08/29/2019 08:47 AM   GFRNONAA >60 02/11/2020 07:54 PM   GFRAA >60 02/11/2020 07:54 PM   K  4.2 02/11/2020 07:54 PM   K 3.8 01/09/2020 08:11 AM   Patient checks BP at home daily  Patient home BP readings are ranging: 120/80s  Patient has failed these meds in the past: amlodipine, carvedilol, diltiazem, furosemide, metoprolol, valsartan Patient is currently controlled on the following medications:   irbesartan-HCTZ 300-12.5 mg daily,   torsemide 20 mg daily,   potassium chloride 20 meq - 2 tabs daily   We discussed diet and exercise extensively, pt does not take torsemide every day due to frequency of urination, but does take it when he noticed swelling or eats salty foods. He does take potassium daily. He reports occasional muscle cramps, especially at night  Plan  Continue current medications and control with diet and exercise    Hyperlipidemia / CAD   LDL goal < 70 CAD: 09/2019 nuclear stress test low risk study.  Lipid Panel     Component Value Date/Time   CHOL 83 (L) 09/20/2019 0746   TRIG 57 09/20/2019 0746   TRIG 90 06/25/2009 0000   HDL 32 (L) 09/20/2019 0746   CHOLHDL 2.6 09/20/2019 0746   CHOLHDL 3 08/29/2019 0847   VLDL 20.2 08/29/2019 0847   LDLCALC 37 09/20/2019 0746   LABVLDL 14 09/20/2019 0746    The ASCVD Risk score (Goff DC Jr., et al., 2013) failed to calculate for the following reasons:   The valid total cholesterol range is  130 to 320 mg/dL   Patient has failed these meds in past: atorvastatin, pravastatin Patient is currently controlled on the following medications:   rosuvastatin 40 mg daily,   ezetimibe 10 mg daily,   nitroglycerin 0.4 mg SL prn,   aspirin 81 mg daily     We discussed:  diet and exercise extensively, cholesterol goals, consequences of elevated cholesterol, mechanisms of statin and ezetimibe for synergistic cholesterol lowering.  Plan  Continue current medications and control with diet and exercise    Gout   Patient has failed these meds in past: n/a Patient is currently controlled on the following medications:   allopurinol 300 mg daily,   colchicine 0.6 mg PRN  We discussed:   Pt reports last gout flare 3 months ago. He knows foods that worsen gout - wine, shell fish. He ran out of colchicine, he does have success with cherry juice as a home remedy to treat gout flares.   Plan  Continue current medications and control with diet and exercise   Constipation   Patient has failed these meds in past: n/a Patient is currently controlled on the following medications:  . Senna-docusate  . Miralax  . Trulance 3 mg prn (samples)  We discussed:  Pt reports Trulance does help with constipation but causes diarrhea so he limits use; discussed benefits of daily stool softener and increased fiber intake  Plan  Continue current medications  Medication Management   Pt uses [facility] pharmacy for all medications 7-day AM-PM pill box Pt endorses 100% compliance  We discussed:  [facility] is a preferred pharmacy and patient is satisfied with pharmacy services.  Plan  Continue current med management strategy     Follow up: 6 month phone visit  Al Corpus, PharmD, [facility] Clinical Pharmacist [facility] [phone]

## 2020-03-13 ENCOUNTER — Other Ambulatory Visit: Payer: Self-pay

## 2020-03-13 ENCOUNTER — Telehealth (INDEPENDENT_AMBULATORY_CARE_PROVIDER_SITE_OTHER): Payer: Medicare Other | Admitting: Family

## 2020-03-13 DIAGNOSIS — J309 Allergic rhinitis, unspecified: Secondary | ICD-10-CM | POA: Diagnosis not present

## 2020-03-13 DIAGNOSIS — M1000 Idiopathic gout, unspecified site: Secondary | ICD-10-CM | POA: Diagnosis not present

## 2020-03-13 DIAGNOSIS — J019 Acute sinusitis, unspecified: Secondary | ICD-10-CM

## 2020-03-13 MED ORDER — DOXYCYCLINE HYCLATE 100 MG PO TABS
100.0000 mg | ORAL_TABLET | Freq: Two times a day (BID) | ORAL | 0 refills | Status: DC
Start: 2020-03-13 — End: 2020-07-05

## 2020-03-13 MED ORDER — FLUTICASONE PROPIONATE 50 MCG/ACT NA SUSP
2.0000 | Freq: Every day | NASAL | 6 refills | Status: DC
Start: 2020-03-13 — End: 2023-08-03

## 2020-03-13 MED ORDER — ALLOPURINOL 300 MG PO TABS: ORAL_TABLET | ORAL | 0 refills | Status: DC

## 2020-03-13 NOTE — Progress Notes (Signed)
[name] is a 49 y.o. male with the following history as recorded in EpicCare:  Patient Active Problem List   Diagnosis Date Noted  . Irritable bowel syndrome with constipation 01/10/2020  . Ejection fraction < 50% 09/26/2019  . LVH (left ventricular hypertrophy) due to hypertensive disease, with heart failure (HCC) 09/26/2019  . Routine general medical examination at a health care facility 06/14/2018  . Hepatic steatosis 11/03/2017  . Primary narcolepsy without cataplexy 06/22/2017  . Periodic limb movements of sleep 06/22/2017  . Hypersomnia 06/22/2017  . Nonintractable epilepsy with complex partial seizures (HCC) 03/23/2017  . Drug-induced erectile dysfunction 11/06/2015  . Body mass index (BMI) of 35.0 to 35.9 with comorbidity   . Sarcoidosis (HCC) 09/29/2010  . GERD 01/07/2010  . Exercise-induced asthma 12/03/2009  . POLYNEUROPATHY 10/17/2009  . Male hypogonadism 10/08/2009  . Diabetes mellitus type 2 in obese (HCC) 10/07/2009  . Hyperlipidemia with target LDL less than 70 10/07/2009  . Gout 10/07/2009  . Essential hypertension 10/07/2009  . CAD (coronary artery disease) 10/07/2009  . Male erectile dysfunction 10/07/2009    Current Outpatient Medications  Medication Sig Dispense Refill  . albuterol (VENTOLIN HFA) 108 (90 Base) MCG/ACT inhaler Inhale 2 puffs into the lungs every 6 (six) hours as needed for wheezing or shortness of breath. 3 Inhaler 1  . allopurinol (ZYLOPRIM) 300 MG tablet TAKE 1 TABLET(300 MG) BY MOUTH DAILY 90 tablet 0  . anastrozole (ARIMIDEX) 1 MG tablet Take 1 mg by mouth daily.    Marland Kitchen aspirin EC 81 MG tablet Take 1 tablet (81 mg total) by mouth daily. 30 tablet 3  . cholecalciferol (VITAMIN D) 1000 units tablet Take 1,000 Units by mouth daily.    . clomiPHENE (CLOMID) 50 MG tablet SMARTSIG:0.5 Tablet(s) By Mouth Every Other Day    . colchicine 0.6 MG tablet Take 0.6 mg by mouth every 4 (four) hours as needed (gout pain).    Marland Kitchen doxycycline  (VIBRA-TABS) 100 MG tablet Take 1 tablet (100 mg total) by mouth 2 (two) times daily. 20 tablet 0  . ezetimibe (ZETIA) 10 MG tablet Take 1 tablet (10 mg total) by mouth daily. 30 tablet 11  . fluticasone (FLONASE) 50 MCG/ACT nasal spray Place 2 sprays into both nostrils daily. 16 g 6  . folic acid (FOLVITE) 1 MG tablet TK 1 T PO QD  1  . glucose blood (ONETOUCH VERIO) test strip 1 each by Other route daily. And lancets 1/day (Patient taking differently: 1 each by Other route daily. ) 100 each 11  . irbesartan-hydrochlorothiazide (AVALIDE) 300-12.5 MG tablet TAKE 1 TABLET BY MOUTH DAILY 90 tablet 1  . nitroGLYCERIN (NITROSTAT) 0.4 MG SL tablet Place 1 tablet (0.4 mg total) under the tongue every 5 (five) minutes as needed for chest pain. 25 tablet 3  . omeprazole (PRILOSEC) 40 MG capsule TAKE 1 CAPSULE BY MOUTH TWICE DAILY 180 capsule 0  . ondansetron (ZOFRAN) 4 MG tablet Take 1 tablet (4 mg total) by mouth every 8 (eight) hours as needed for nausea or vomiting. 6 tablet 0  . pioglitazone (ACTOS) 30 MG tablet Take 1 tablet (30 mg total) by mouth daily. 90 tablet 3  . potassium chloride SA (KLOR-CON) 20 MEQ tablet TAKE 1 TABLET(20 MEQ) BY MOUTH TWICE DAILY 180 tablet 0  . repaglinide (PRANDIN) 0.5 MG tablet Take 1 tablet (0.5 mg total) by mouth 3 (three) times daily before meals. 90 tablet 11  . rosuvastatin (CRESTOR) 40 MG tablet TAKE 1 TABLET(40  MG) BY MOUTH DAILY 90 tablet 1  . saw palmetto 500 MG capsule Take 500 mg by mouth daily.    Marland Kitchen SYNJARDY XR 12.10-998 MG TB24 TAKE 2 TABLETS BY MOUTH DAILY 180 tablet 0  . tadalafil (CIALIS) 20 MG tablet TAKE ONE TABLET BY MOUTH DAILY AS NEEDED 10 tablet 3  . tamsulosin (FLOMAX) 0.4 MG CAPS capsule Take 0.4 mg by mouth daily.    Marland Kitchen torsemide (DEMADEX) 20 MG tablet TAKE 1 TABLET(20 MG) BY MOUTH TWICE DAILY 180 tablet 2   No current facility-administered medications for this visit.    Allergies: Amlodipine, Sulfamethoxazole, and Sulfonamide derivatives   Past Medical History:  Diagnosis Date  . Anginal pain (HCC)   . Arthritis    ? 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 Followed by Dr. Nickola Major  . Arthritis    "ankles" (04/13/2016)  . Asthma   . CORONARY ARTERY DISEASE    a. cath 05/2014: mild LAD disease, normal LCx and RCA. Risk factor modification recommended.  Marland Kitchen DIABETES MELLITUS, TYPE II   . Edema   . ERECTILE DYSFUNCTION, ORGANIC   . GERD   . Gout, unspecified   . Heart murmur   . HYPERLIPIDEMIA   . HYPERTENSION   . 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 Pericarditis    recurrent  . POLYNEUROPATHY   . PULMONARY SARCOIDOSIS    Mediastinal lymphadenopathy with biospy proven sarcodosis  . Seizures (HCC)    "none in 4-5 years; don't know what kind; not related to alcohol" (04/13/2016)  . TESTICULAR HYPOFUNCTION   . TIA (transient ischemic attack)    "I don't remember when" (04/13/2016)    Past Surgical History:  Procedure Laterality Date  . BRONCHOSCOPY  08/21/08  . CARDIAC CATHETERIZATION  06/25/2009   minimal disease  . CARDIAC CATHETERIZATION N/A 04/14/2016   Procedure: Left Heart Cath and Coronary Angiography;  Surgeon: Kathleene Hazel, MD;  Location[facility] INVAS[facility] CV LAB;  Service: Cardiovascular;  Laterality: N/A;  . CHOLECYSTECTOMY N/A 09/21/2017   Procedure: LAPAROSCOPIC CHOLECYSTECTOMY WITH INTRAOPERATIVE CHOLANGIOGRAM;  Surgeon: Glenna Fellows, MD;  Location: [facility] ORS;  Service: General;  Laterality: N/A;  . LEFT HEART CATHETERIZATION WITH CORONARY ANGIOGRAM N/A 05/18/2014   Procedure: LEFT HEART CATHETERIZATION WITH CORONARY ANGIOGRAM;  Surgeon: Corky Crafts, MD;  Location[facility]ATH LAB;  Service: Cardiovascular;  Laterality: N/A;  . MEDIASTINOSCOPY  11/30/08    Family History  Problem Relation Age of Onset  . Diabetes Mother   . Hypertension Mother   . Diabetes Father   . Heart disease Father 69       Fatal MI  . Heart failure Father     Social History    Tobacco Use  . Smoking status: Former Smoker    Packs/day: 1.00    Years: 10.00    Pack years: 10.00    Types: Cigarettes    Quit date: 05/18/2009    Years since quitting: 10.8  . Smokeless tobacco: Never Used  Substance Use Topics  . Alcohol use: Yes    Alcohol/week: 0.0 standard drinks    Comment: 04/13/2016 "glass of wine a couple times/month"    Subjective:   I connected with [name] on 03/13/20 at  9:40 AM EDT by a video enabled telemedicine application and verified that I am speaking with the correct person using two identifiers.   I discussed the limitations of evaluation and management by telemedicine and the availability of in person  appointments. The patient expressed understanding and agreed to proceed. Provider in office/ patient is at home; provider and patient are only 2 people on video call.   Concerned for sinus infection; symptoms started after allergy flare last week; now having facial pain/ pressure and right ear pain/ sore throat; no fever or chest congestion; using OTC Zyrtec and is prone to sinus infections in the fall and spring; Fully vaccinated against COVID;  Needs refill on his Allopurinol;  Needs to schedule in office visit to follow up on kidney infection in early September- no symptoms at this time;    Objective:  There were no vitals filed for this visit.  General: Well developed, well nourished, in no acute distress  Head: Normocephalic and atraumatic  Lungs: Respirations unlabored;  Neurologic: Alert and oriented; speech intact; face symmetrical; moves all extremities well; CNII-XII intact without focal deficit   Assessment:  1. Acute sinusitis, recurrence not specified, unspecified location   2. Allergic rhinitis, unspecified seasonality, unspecified trigger   3. Idiopathic gout, unspecified chronicity, unspecified site     Plan:  1. Suspect sinus infection secondary to recent allergy flare; continue Zyrtec and will add Zyrtec  and Doxycycline ( has taken keflex within the past month); follow up if symptoms persist and will discuss COVID testing;  3. Refill updated as requested;  Plan to see his PCP in office in 1 week;   No follow-ups on file.  No orders of the defined types were placed in this encounter.   Requested Prescriptions   Signed Prescriptions Disp Refills  . allopurinol (ZYLOPRIM) 300 MG tablet 90 tablet 0    Sig: TAKE 1 TABLET(300 MG) BY MOUTH DAILY  . fluticasone (FLONASE) 50 MCG/ACT nasal spray 16 g 6    Sig: Place 2 sprays into both nostrils daily.  Marland Kitchen doxycycline (VIBRA-TABS) 100 MG tablet 20 tablet 0    Sig: Take 1 tablet (100 mg total) by mouth 2 (two) times daily.

## 2020-03-21 ENCOUNTER — Ambulatory Visit: Payer: Medicare Other | Admitting: Internal Medicine

## 2020-03-21 ENCOUNTER — Encounter: Payer: Self-pay | Admitting: Internal Medicine

## 2020-03-21 ENCOUNTER — Other Ambulatory Visit: Payer: Self-pay

## 2020-03-21 ENCOUNTER — Ambulatory Visit (INDEPENDENT_AMBULATORY_CARE_PROVIDER_SITE_OTHER): Payer: Medicare Other | Admitting: Internal Medicine

## 2020-03-21 VITALS — BP 136/82 | HR 92 | Temp 98.7°F | Resp 16 | Ht 77.0 in | Wt 282.0 lb

## 2020-03-21 VITALS — BP 110/70 | HR 75 | Temp 97.3°F | Ht 77.0 in | Wt 283.0 lb

## 2020-03-21 DIAGNOSIS — R059 Cough, unspecified: Secondary | ICD-10-CM | POA: Diagnosis not present

## 2020-03-21 DIAGNOSIS — R350 Frequency of micturition: Secondary | ICD-10-CM

## 2020-03-21 DIAGNOSIS — E1169 Type 2 diabetes mellitus with other specified complication: Secondary | ICD-10-CM | POA: Diagnosis not present

## 2020-03-21 DIAGNOSIS — Z79899 Other long term (current) drug therapy: Secondary | ICD-10-CM

## 2020-03-21 DIAGNOSIS — I10 Essential (primary) hypertension: Secondary | ICD-10-CM

## 2020-03-21 DIAGNOSIS — Z7185 Encounter for immunization safety counseling: Secondary | ICD-10-CM

## 2020-03-21 DIAGNOSIS — R0609 Other forms of dyspnea: Secondary | ICD-10-CM

## 2020-03-21 DIAGNOSIS — R0600 Dyspnea, unspecified: Secondary | ICD-10-CM | POA: Diagnosis not present

## 2020-03-21 DIAGNOSIS — Z23 Encounter for immunization: Secondary | ICD-10-CM

## 2020-03-21 DIAGNOSIS — E669 Obesity, unspecified: Secondary | ICD-10-CM | POA: Diagnosis not present

## 2020-03-21 DIAGNOSIS — R942 Abnormal results of pulmonary function studies: Secondary | ICD-10-CM

## 2020-03-21 DIAGNOSIS — R918 Other nonspecific abnormal finding of lung field: Secondary | ICD-10-CM

## 2020-03-21 DIAGNOSIS — Z862 Personal history of diseases of the blood and blood-forming organs and certain disorders involving the immune mechanism: Secondary | ICD-10-CM

## 2020-03-21 DIAGNOSIS — D84821 Immunodeficiency due to drugs: Secondary | ICD-10-CM

## 2020-03-21 DIAGNOSIS — D869 Sarcoidosis, unspecified: Secondary | ICD-10-CM | POA: Diagnosis not present

## 2020-03-21 LAB — POCT URINALYSIS DIPSTICK
Bilirubin, UA: NEGATIVE
Blood, UA: NEGATIVE
Glucose, UA: POSITIVE — AB
Ketones, UA: NEGATIVE
Leukocytes, UA: NEGATIVE
Nitrite, UA: NEGATIVE
Protein, UA: NEGATIVE
Spec Grav, UA: 1.015 (ref 1.010–1.025)
Urobilinogen, UA: NEGATIVE E.U./dL — AB
pH, UA: 6 (ref 5.0–8.0)

## 2020-03-21 LAB — PULMONARY FUNCTION TEST
DL/VA % pred: 128 %
DL/VA: 5.67 ml/min/mmHg/L
DLCO cor % pred: 82 %
DLCO cor: 28.46 ml/min/mmHg
DLCO unc % pred: 83 %
DLCO unc: 28.93 ml/min/mmHg
FEF 25-75 Post: 2.86 L/sec
FEF 25-75 Pre: 5.04 L/sec
FEF2575-%Change-Post: -43 %
FEF2575-%Pred-Post: 72 %
FEF2575-%Pred-Pre: 127 %
FEV1-%Change-Post: -8 %
FEV1-%Pred-Post: 73 %
FEV1-%Pred-Pre: 80 %
FEV1-Post: 2.96 L
FEV1-Pre: 3.25 L
FEV1FVC-%Change-Post: -2 %
FEV1FVC-%Pred-Pre: 108 %
FEV6-%Change-Post: -6 %
FEV6-%Pred-Post: 70 %
FEV6-%Pred-Pre: 75 %
FEV6-Post: 3.47 L
FEV6-Pre: 3.71 L
FEV6FVC-%Pred-Post: 102 %
FEV6FVC-%Pred-Pre: 102 %
FVC-%Change-Post: -6 %
FVC-%Pred-Post: 69 %
FVC-%Pred-Pre: 73 %
FVC-Post: 3.47 L
FVC-Pre: 3.71 L
Post FEV1/FVC ratio: 85 %
Post FEV6/FVC ratio: 100 %
Pre FEV1/FVC ratio: 88 %
Pre FEV6/FVC Ratio: 100 %
RV % pred: 83 %
RV: 1.88 L
TLC % pred: 66 %
TLC: 5.29 L

## 2020-03-21 LAB — POCT EXHALED NITRIC OXIDE: FeNO level (ppb): 25

## 2020-03-21 NOTE — Patient Instructions (Addendum)
ICD-10-CM   1. Sarcoidosis (HCC)  D86.9 POCT EXHALED NITRIC OXIDE  2. Cough  R05.9   3. Dyspnea on exertion  R06.00   4. Abnormal flow volume loop  R94.2   5. Immunosuppression due to drug therapy (HCC)  D84.821    Z79.899   6. Vaccine counseling  Z71.85     Glad you are well and that cough nearly resolved and only very mild occassional shortness of breath No evidence of active sarcoid in lung No evidence of fibrosis or cancer Very small tiny nodules </= 3 mm - cause not known but could be sarcoid   - you might need a followup CT in 1 year for this There is abnormal flow volume loop pattern on PFT "big breathing test"  Plan  - Do blood cbc with diff, IgE RAST allergy profile 03/21/2020   - wil update you with results via my  Chart. If normal, no call will be placed but track on my chart - Refer ENT to ensure no vocal cord issues  - Recommend 3rd covid vaccine booster - ok to do pfizer after first 2 being moderna  Followup 1 year or sooner if needed   - will decide on followup CT at that time of face to face followup

## 2020-03-21 NOTE — Progress Notes (Signed)
Subjective:  Patient ID: [name], male    DOB: 1970/10/08  Age: 49 y.o. MRN: [id number]  CC: Hypertension and Diabetes  This visit occurred during the SARS-CoV-2 public health emergency.  Safety protocols were in place, including screening questions prior to the visit, additional usage of staff PPE, and extensive cleaning of exam room while observing appropriate contact time as indicated for disinfecting solutions.    HPI [name] presents for f/up - He tells me that nearly 2 months ago he was seen in the emergency department for what he thought was a kidney infection.  His UA and urine culture was negative.  He completed an antibiotic regimen.  Then more recently he was seen for a sinus infection.  He is currently completing a course of doxycycline.  His only complaint today is urinary frequency.  Outpatient Medications Prior to Visit  Medication Sig Dispense Refill  . albuterol (VENTOLIN HFA) 108 (90 Base) MCG/ACT inhaler Inhale 2 puffs into the lungs every 6 (six) hours as needed for wheezing or shortness of breath. 3 Inhaler 1  . allopurinol (ZYLOPRIM) 300 MG tablet TAKE 1 TABLET(300 MG) BY MOUTH DAILY 90 tablet 0  . anastrozole (ARIMIDEX) 1 MG tablet Take 1 mg by mouth daily.    Marland Kitchen aspirin EC 81 MG tablet Take 1 tablet (81 mg total) by mouth daily. 30 tablet 3  . cholecalciferol (VITAMIN D) 1000 units tablet Take 1,000 Units by mouth daily.    . clomiPHENE (CLOMID) 50 MG tablet SMARTSIG:0.5 Tablet(s) By Mouth Every Other Day    . colchicine 0.6 MG tablet Take 0.6 mg by mouth every 4 (four) hours as needed (gout pain).    Marland Kitchen doxycycline (VIBRA-TABS) 100 MG tablet Take 1 tablet (100 mg total) by mouth 2 (two) times daily. 20 tablet 0  . ezetimibe (ZETIA) 10 MG tablet Take 1 tablet (10 mg total) by mouth daily. 30 tablet 11  . fluticasone (FLONASE) 50 MCG/ACT nasal spray Place 2 sprays into both nostrils daily. 16 g 6  . folic acid (FOLVITE) 1 MG tablet TK 1 T PO QD  1  .  glucose blood (ONETOUCH VERIO) test strip 1 each by Other route daily. And lancets 1/day (Patient taking differently: 1 each by Other route daily. ) 100 each 11  . irbesartan-hydrochlorothiazide (AVALIDE) 300-12.5 MG tablet TAKE 1 TABLET BY MOUTH DAILY 90 tablet 1  . nitroGLYCERIN (NITROSTAT) 0.4 MG SL tablet Place 1 tablet (0.4 mg total) under the tongue every 5 (five) minutes as needed for chest pain. 25 tablet 3  . omeprazole (PRILOSEC) 40 MG capsule TAKE 1 CAPSULE BY MOUTH TWICE DAILY 180 capsule 0  . ondansetron (ZOFRAN) 4 MG tablet Take 1 tablet (4 mg total) by mouth every 8 (eight) hours as needed for nausea or vomiting. 6 tablet 0  . pioglitazone (ACTOS) 30 MG tablet Take 1 tablet (30 mg total) by mouth daily. 90 tablet 3  . potassium chloride SA (KLOR-CON) 20 MEQ tablet TAKE 1 TABLET(20 MEQ) BY MOUTH TWICE DAILY 180 tablet 0  . repaglinide (PRANDIN) 0.5 MG tablet Take 1 tablet (0.5 mg total) by mouth 3 (three) times daily before meals. 90 tablet 11  . rosuvastatin (CRESTOR) 40 MG tablet TAKE 1 TABLET(40 MG) BY MOUTH DAILY 90 tablet 1  . saw palmetto 500 MG capsule Take 500 mg by mouth daily.    Marland Kitchen SYNJARDY XR 12.10-998 MG TB24 TAKE 2 TABLETS BY MOUTH DAILY 180 tablet 0  . tadalafil (  CIALIS) 20 MG tablet TAKE ONE TABLET BY MOUTH DAILY AS NEEDED 10 tablet 3  . tamsulosin (FLOMAX) 0.4 MG CAPS capsule Take 0.4 mg by mouth daily.    Marland Kitchen torsemide (DEMADEX) 20 MG tablet TAKE 1 TABLET(20 MG) BY MOUTH TWICE DAILY 180 tablet 2   No facility-administered medications prior to visit.    ROS Review of Systems  Constitutional: Negative for appetite change, chills, diaphoresis, fatigue and fever.  HENT: Negative.  Negative for congestion, facial swelling, sinus pressure, sore throat and trouble swallowing.   Eyes: Negative.   Respiratory: Negative for cough, chest tightness, shortness of breath and wheezing.   Cardiovascular: Negative for chest pain, palpitations and leg swelling.  Gastrointestinal:  Negative for abdominal pain, constipation, diarrhea, nausea and vomiting.  Endocrine: Positive for polyuria. Negative for polydipsia.  Genitourinary: Positive for frequency. Negative for difficulty urinating, dysuria, flank pain, hematuria, testicular pain and urgency.  Musculoskeletal: Negative.  Negative for arthralgias.  Skin: Negative.  Negative for color change and rash.  Neurological: Negative.  Negative for dizziness, weakness and light-headedness.  Hematological: Negative for adenopathy. Does not bruise/bleed easily.  Psychiatric/Behavioral: Negative.     Objective:  BP 136/82   Pulse 92   Temp 98.7 F (37.1 C) (Oral)   Resp 16   Ht 6\' 5"  (1.956 m)   Wt 282 lb (127.9 kg)   SpO2 95%   BMI 33.44 kg/m   BP Readings from Last 3 Encounters:  03/21/20 136/82  03/21/20 110/70  02/12/20 105/65    Wt Readings from Last 3 Encounters:  03/21/20 282 lb (127.9 kg)  03/21/20 283 lb (128.4 kg)  02/11/20 282 lb (127.9 kg)    Physical Exam Vitals reviewed.  HENT:     Nose: Nose normal.     Mouth/Throat:     Mouth: Mucous membranes are moist.  Eyes:     General: No scleral icterus.    Conjunctiva/sclera: Conjunctivae normal.  Cardiovascular:     Rate and Rhythm: Normal rate and regular rhythm.     Heart sounds: No murmur heard.   Pulmonary:     Effort: Pulmonary effort is normal.     Breath sounds: No stridor. No wheezing, rhonchi or rales.  Abdominal:     General: Abdomen is flat. Bowel sounds are normal. There is no distension.     Palpations: Abdomen is soft. There is no hepatomegaly, splenomegaly or mass.     Tenderness: There is no abdominal tenderness.  Musculoskeletal:        General: Normal range of motion.     Cervical back: Neck supple.     Right lower leg: No edema.     Left lower leg: No edema.  Lymphadenopathy:     Cervical: No cervical adenopathy.  Skin:    General: Skin is warm and dry.     Coloration: Skin is not pale.  Neurological:     General:  No focal deficit present.     Mental Status: He is alert.  Psychiatric:        Mood and Affect: Mood normal.        Behavior: Behavior normal.     Lab Results  Component Value Date   WBC 8.5 02/11/2020   HGB 15.2 02/11/2020   HCT 45.0 02/11/2020   PLT 249 02/11/2020   GLUCOSE 160 (H) 02/11/2020   CHOL 83 (L) 09/20/2019   TRIG 57 09/20/2019   HDL 32 (L) 09/20/2019   LDLCALC 37 09/20/2019   ALT 19  01/09/2020   AST 18 01/09/2020   NA 136 02/11/2020   K 4.2 02/11/2020   CL 101 02/11/2020   CREATININE 0.68 02/11/2020   BUN 19 02/11/2020   CO2 23 02/11/2020   TSH 0.62 06/14/2018   PSA 1.57 06/14/2018   INR 0.89 05/24/2016   HGBA1C 7.5 (A) 01/10/2020   MICROALBUR 1.0 08/29/2019    CT Renal Stone Study  Result Date: 02/12/2020 CLINICAL DATA:  Left flank pain EXAM: CT ABDOMEN AND PELVIS WITHOUT CONTRAST TECHNIQUE: Multidetector CT imaging of the abdomen and pelvis was performed following the standard protocol without IV contrast. COMPARISON:  01/09/2020 FINDINGS: Lower chest: Lung bases are clear. No effusions. Heart is normal size. Hepatobiliary: Prior cholecystectomy. Subtle micro nodularity to the liver surface suggests the possibility of cirrhosis. Recommend clinical correlation. No focal hepatic abnormality. Pancreas: No focal abnormality or ductal dilatation. Spleen: No focal abnormality.  Normal size. Adrenals/Urinary Tract: No renal or ureteral stones. No hydronephrosis. Small exophytic cyst off the midpole of the right kidney. Adrenal glands and urinary bladder unremarkable. Stomach/Bowel: Normal appendix. Stomach, large and small bowel grossly unremarkable. Vascular/Lymphatic: No evidence of aneurysm or adenopathy. Reproductive: No visible focal abnormality. Other: No free fluid or free air. Musculoskeletal: No acute bony abnormality. IMPRESSION: No renal or ureteral stones.  No hydronephrosis. Subtle nodular appearance of the liver surface suggests the possibility of cirrhosis.  Recommend clinical correlation. No acute findings in the abdomen or pelvis. Electronically Signed   By: Charlett Nose M.D.   On: 02/12/2020 01:53     Assessment & Plan:   Lamondre was seen today for hypertension and diabetes.  Diagnoses and all orders for this visit:  Urinary frequency- Based on his symptoms, exam, and the urinalysis that only shows glucosuria there is no evidence of infection at this time. -     Cancel: POCT Urinalysis Dipstick -     POCT Urinalysis Dipstick  Essential hypertension- His blood pressure is adequately well controlled.  Diabetes mellitus type 2 in obese [facility])- His recent A1c demonstrated that his blood sugar is adequately well controlled.  Other orders -     Flu Vaccine QUAD 6+ mos PF IM (Fluarix Quad PF)   I am having [name] maintain his aspirin EC, cholecalciferol, folic acid, nitroGLYCERIN, albuterol, colchicine, anastrozole, glucose blood, repaglinide, pioglitazone, ezetimibe, clomiPHENE, saw palmetto, tamsulosin, torsemide, rosuvastatin, omeprazole, irbesartan-hydrochlorothiazide, Synjardy XR, ondansetron, potassium chloride SA, tadalafil, allopurinol, fluticasone, and doxycycline.  No orders of the defined types were placed in this encounter.    Follow-up: No follow-ups on file.  Sanda Linger, MD

## 2020-03-21 NOTE — Progress Notes (Signed)
04/23/2016 Acute OV : Sarcoid   49 yo male followed for Sarcoid w/ pulmonary /neuro/joint involvement , and mild persistent asthma.   TEST  Spirometry 01/14/11 >> FEV1 3.15 (78%), FVC 3.68 (72%), FEV1% 86  Spirometry 12/09/11 >> FEV1 3.25 (80%), FVC 3.85 (76%), FEV1% 84    Last seen in office 3.5 yr ago. Presents today for Sarcoid flare . Complains of joint pain , chest tightness, and increased sob, and dry cough for last couple of months. Worse for last 2 weeks.  Previously on MTX thru Rheumatology. Says he ran out of refills and Rheumatologist would not refill. Marland Kitchen Has been off for over 1-2 years ago.  Was admitted 04/13/16 with chest pain , underwent cardiac cath that showed mild non obstructive CAD. CXR showed nad. Nml H/H.  He denies fever, discolored mucus, orthopena, edema or hemotpysis. Occasional rash but nothing right now  OV 01/25/2020  Subjective:  Patient ID: [name], male , DOB: 1970/07/19 , age 49 y.o. , MRN: [id number] , ADDRESS: [address] [place] [address]  PCP [name], MD  01/25/2020 -   Chief Complaint  Patient presents with  . Consult    No complaints    HPI [name] 49 y.o. -this is a new consult because he is reestablishing care. He was last seen in our practice more than 3 years ago. Therefore this is a new visit. 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 He tells me that in 2010 he had sarcoidosis diagnosed when he had diffuse arthralgia shortness of breath cough fever, malaise weight loss [B symptoms] and at that point in time also had arthralgia. He also had seizures. Has not had seizures now for 6 years. He was diagnosed with sarcoidosis. He was placed on methotrexate once a week which she is continued since 2010. His last seizure was 6 years ago. At baseline now he only has mild shortness of breath with exertion but there is also some variability with it when he exerts at some points he does not have shortness  of breath but at some point he does have shortness of breath. There is no shortness of breath at rest. He has some baseline arthralgia. He says overall methotrexate is doing well for him. Then in the background of all this couple of times a year he will have a respiratory flare along with joint pain and tiredness. For this he will be treated with a short course of prednisone. He is not on maintenance prednisone. He is just immunosuppressed with methotrexate. Then most recently in July 2021 he had another flareup with joint pain tiredness shortness of breath but no fevers. However he did have night sweats. He was given 10-day prednisone and is now nearly back to his baseline. His shortness of breath is baseline but he still has a residual cough that is occasional but it is improving. He decided to reestablish with a pulmonologist and therefore is here to see me. He wants to make sure his lungs are okay.  Of note he has significant baseline environmental allergies. He reports being "awful with pollen" not otherwise specified.   His left ventricular ejection fraction in May 2021 was 55-60%. Diastolic parameters were considered normal.  His blood work in August 2021 showed creatinine normal 0.75 mg percent. Hemoglobin normal 14.3 g%. His blood eosinophils was 100 cells per cubic millimeter.  His hemoglobin A1c was elevated at 7.5   His CT abdomen lung images  in August 2021 was clear   ROS - per HPI     OV 03/21/2020  Subjective:  Patient ID: [name], male , DOB: 1971-05-16 , age 49 y.o. , MRN: [id number] , ADDRESS: [address] [place] [address] PCP [name], MD Patient Care Team: [name], MD as PCP - General (Internal Medicine) Lars Masson, MD as PCP - Cardiology (Cardiology) Virgilio Frees as Consulting Physician (Rheumatology) Loyal Gambler, MD as Referring Physician (Rheumatology) [name], MD as Consulting Physician (Pulmonary  Disease) Romero Belling, MD as Consulting Physician (Endocrinology) Lars Masson, MD (Cardiology) Kathyrn Sheriff, [facility] as Pharmacist (Pharmacist)  This Provider for this visit: Treatment Team:  Attending Provider: Kalman Shan, MD    03/21/2020 -   Chief Complaint  Patient presents with  . Follow-up    Hx of Sarcoidosis     HPI [name] 49 y.o. -presents for follow-up to discuss his test results.  He did not do the CBC and blood IgE and allergy profile.  Nevertheless overall his cough is nearly resolved with time.  He only has very mild shortness of breath on occasion.  He had high-resolution CT chest that did not show any evidence of ILD or mediastinal adenopathy.  No evidence of overt sarcoidosis.  Occasional scattered 3 mm nodules.  There is no evidence of lung cancer pneumonia emphysema either.  With asthma being the other differential diagnosis we checked exhaled nitric oxide test today and it is 24 and normal.  He had full pulmonary function test that shows variations particularly with a flow volume loop as the major abnormality.  No results found for: NITRICOXIDE   No results found for: NITRICOXIDE       PFT Results Latest Ref Rng & Units 03/21/2020  FVC-Pre L 3.71  FVC-Predicted Pre % 73  FVC-Post L 3.47  FVC-Predicted Post % 69  Pre FEV1/FVC % % 88  Post FEV1/FCV % % 85  FEV1-Pre L 3.25  FEV1-Predicted Pre % 80  FEV1-Post L 2.96  DLCO uncorrected ml/min/mmHg 28.93  DLCO UNC% % 83  DLCO corrected ml/min/mmHg 28.46  DLCO COR %Predicted % 82  DLVA Predicted % 128  TLC L 5.29  TLC % Predicted % 66  RV % Predicted % 83   CT chest high resolution 02/07/20  IMPRESSION: 1. No evidence of fibrotic interstitial lung disease. 2. Occasional tiny pulmonary nodules, the largest a 3 mm pulmonary nodule of the right pulmonary apex. No follow-up needed if patient is low-risk (and has no known or suspected primary neoplasm). Non-contrast chest CT  can be considered in 12 months if patient is high-risk. This recommendation follows the consensus statement: Guidelines for Management of Incidental Pulmonary Nodules Detected on CT Images: From the Fleischner Society 2017; Radiology 2017; 284:228-243. 3. Coronary artery disease.  Aortic Atherosclerosis (ICD10-I70.0).   Electronically Signed   By: Lauralyn Primes M.D.   On: 02/07/2020 12:05   ROS - per HPI   Results for [name], [name] (MRN [id number]) as of 03/21/2020 10:06  Ref. Range 08/10/2008 17:12 08/10/2008 20:52  Anti Nuclear Antibody (ANA) Latest Ref Range: NEGATIVE   NEG  Angiotensin 1 Converting Enzyme Latest Ref Range: 9 - 67 units/L  38  RA Latex Turbid. Latest Ref Range: 0.0 - 20.0 intl units/mL < 20.0 IU/mL (L)     Results for YARDLEY, LEKAS (MRN [id number]) as of 03/21/2020 10:06  Ref. Range 11/03/2017 09:32 05/24/2018 11:38 06/14/2018 15:56 08/29/2019 08:47 01/09/2020  08:11  Eosinophils Absolute Latest Ref Range: 0.0 - 0.5 K/uL 0.2 0.2 0.2 0.1 0.1      has a past medical history of Anginal pain (HCC), Arthritis, Arthritis, Asthma, CORONARY ARTERY DISEASE, DIABETES MELLITUS, TYPE II, Edema, ERECTILE DYSFUNCTION, ORGANIC, GERD, Gout, unspecified, Heart murmur, HYPERLIPIDEMIA, HYPERTENSION, Narcolepsy without cataplexy(347.00), Pericarditis, POLYNEUROPATHY, PULMONARY SARCOIDOSIS, Seizures (HCC), TESTICULAR HYPOFUNCTION, and TIA (transient ischemic attack).   reports that he quit smoking about 10 years ago. His smoking use included cigarettes. He has a 10.00 pack-year smoking history. He has never used smokeless tobacco.  Past Surgical History:  Procedure Laterality Date  . BRONCHOSCOPY  08/21/08  . CARDIAC CATHETERIZATION  06/25/2009   minimal disease  . CARDIAC CATHETERIZATION N/A 04/14/2016   Procedure: Left Heart Cath and Coronary Angiography;  Surgeon: Kathleene Hazel, MD;  Location: [facility] INVASIVE CV LAB;  Service: Cardiovascular;  Laterality: N/A;  .  CHOLECYSTECTOMY N/A 09/21/2017   Procedure: LAPAROSCOPIC CHOLECYSTECTOMY WITH INTRAOPERATIVE CHOLANGIOGRAM;  Surgeon: Glenna Fellows, MD;  Location: [facility] ORS;  Service: General;  Laterality: N/A;  . LEFT HEART CATHETERIZATION WITH CORONARY ANGIOGRAM N/A 05/18/2014   Procedure: LEFT HEART CATHETERIZATION WITH CORONARY ANGIOGRAM;  Surgeon: Corky Crafts, MD;  Location: [facility] CATH LAB;  Service: Cardiovascular;  Laterality: N/A;  . MEDIASTINOSCOPY  11/30/08    Allergies  Allergen Reactions  . Amlodipine Other (See Comments)    Ankle swelling with 10mg , ok with 5mg  dose  . Sulfamethoxazole Rash    fever  . Sulfonamide Derivatives Rash and Other (See Comments)    Immunization History  Administered Date(s) Administered  . Influenza Split 04/06/2011, 02/17/2012, 03/16/2012  . Influenza,inj,Quad PF,6+ Mos 03/21/2013, 03/08/2014, 04/03/2015, 02/19/2016, 02/17/2017, 06/14/2018, 02/20/2019  . Moderna SARS-COVID-2 Vaccination 07/31/2019, 09/05/2019  . Pneumococcal Conjugate-13 04/29/2015  . Pneumococcal Polysaccharide-23 11/06/2015  . Tdap 03/21/2013    Family History  Problem Relation Age of Onset  . Diabetes Mother   . Hypertension Mother   . Diabetes Father   . Heart disease Father 41       Fatal MI  . Heart failure Father      Current Outpatient Medications:  .  albuterol (VENTOLIN HFA) 108 (90 Base) MCG/ACT inhaler, Inhale 2 puffs into the lungs every 6 (six) hours as needed for wheezing or shortness of breath., Disp: 3 Inhaler, Rfl: 1 .  allopurinol (ZYLOPRIM) 300 MG tablet, TAKE 1 TABLET(300 MG) BY MOUTH DAILY, Disp: 90 tablet, Rfl: 0 .  anastrozole (ARIMIDEX) 1 MG tablet, Take 1 mg by mouth daily., Disp: , Rfl:  .  aspirin EC 81 MG tablet, Take 1 tablet (81 mg total) by mouth daily., Disp: 30 tablet, Rfl: 3 .  cholecalciferol (VITAMIN D) 1000 units tablet, Take 1,000 Units by mouth daily., Disp: , Rfl:  .  clomiPHENE (CLOMID) 50 MG tablet, SMARTSIG:0.5 Tablet(s) By Mouth  Every Other Day, Disp: , Rfl:  .  colchicine 0.6 MG tablet, Take 0.6 mg by mouth every 4 (four) hours as needed (gout pain)., Disp: , Rfl:  .  doxycycline (VIBRA-TABS) 100 MG tablet, Take 1 tablet (100 mg total) by mouth 2 (two) times daily., Disp: 20 tablet, Rfl: 0 .  ezetimibe (ZETIA) 10 MG tablet, Take 1 tablet (10 mg total) by mouth daily., Disp: 30 tablet, Rfl: 11 .  fluticasone (FLONASE) 50 MCG/ACT nasal spray, Place 2 sprays into both nostrils daily., Disp: 16 g, Rfl: 6 .  folic acid (FOLVITE) 1 MG tablet, TK 1 T PO QD, Disp: , Rfl: 1 .  glucose blood (ONETOUCH VERIO) test strip, 1 each by Other route daily. And lancets 1/day (Patient taking differently: 1 each by Other route daily. ), Disp: 100 each, Rfl: 11 .  irbesartan-hydrochlorothiazide (AVALIDE) 300-12.5 MG tablet, TAKE 1 TABLET BY MOUTH DAILY, Disp: 90 tablet, Rfl: 1 .  nitroGLYCERIN (NITROSTAT) 0.4 MG SL tablet, Place 1 tablet (0.4 mg total) under the tongue every 5 (five) minutes as needed for chest pain., Disp: 25 tablet, Rfl: 3 .  omeprazole (PRILOSEC) 40 MG capsule, TAKE 1 CAPSULE BY MOUTH TWICE DAILY, Disp: 180 capsule, Rfl: 0 .  ondansetron (ZOFRAN) 4 MG tablet, Take 1 tablet (4 mg total) by mouth every 8 (eight) hours as needed for nausea or vomiting., Disp: 6 tablet, Rfl: 0 .  pioglitazone (ACTOS) 30 MG tablet, Take 1 tablet (30 mg total) by mouth daily., Disp: 90 tablet, Rfl: 3 .  potassium chloride SA (KLOR-CON) 20 MEQ tablet, TAKE 1 TABLET(20 MEQ) BY MOUTH TWICE DAILY, Disp: 180 tablet, Rfl: 0 .  repaglinide (PRANDIN) 0.5 MG tablet, Take 1 tablet (0.5 mg total) by mouth 3 (three) times daily before meals., Disp: 90 tablet, Rfl: 11 .  rosuvastatin (CRESTOR) 40 MG tablet, TAKE 1 TABLET(40 MG) BY MOUTH DAILY, Disp: 90 tablet, Rfl: 1 .  saw palmetto 500 MG capsule, Take 500 mg by mouth daily., Disp: , Rfl:  .  SYNJARDY XR 12.10-998 MG TB24, TAKE 2 TABLETS BY MOUTH DAILY, Disp: 180 tablet, Rfl: 0 .  tadalafil (CIALIS) 20 MG  tablet, TAKE ONE TABLET BY MOUTH DAILY AS NEEDED, Disp: 10 tablet, Rfl: 3 .  tamsulosin (FLOMAX) 0.4 MG CAPS capsule, Take 0.4 mg by mouth daily., Disp: , Rfl:  .  torsemide (DEMADEX) 20 MG tablet, TAKE 1 TABLET(20 MG) BY MOUTH TWICE DAILY, Disp: 180 tablet, Rfl: 2      Objective:   Vitals:   03/21/20 1032 03/21/20 1033  BP: 110/70   Pulse: 75   Temp:  (!) 97.3 F (36.3 C)  TempSrc:  Oral  SpO2: 98%   Weight:  283 lb (128.4 kg)  Height:   (1.956 m)    Estimated body mass index is 33.56 kg/m as calculated from the following:   Height as of this encounter:  (1.956 m).   Weight as of this encounter: 283 lb (128.4 kg).  @  [employer]   03/21/20 1033  Weight: 283 lb (128.4 kg)     Physical Exam  -Results discussion only visit.       Assessment:       ICD-10-CM   1. Sarcoidosis (HCC)  D86.9 POCT EXHALED NITRIC OXIDE    CBC w/Diff    Perennial allergen profile IgE  2. Cough  R05.9 CBC w/Diff    Perennial allergen profile IgE    Ambulatory referral to ENT  3. Dyspnea on exertion  R06.00   4. Abnormal flow volume loop  R94.2   5. Multiple subsolid lung nodules less than 6 mm in diameter  R91.8   6. Immunosuppression due to drug therapy (HCC)  D84.821    Z79.899   7. Vaccine counseling  Z71.85    Overall reassuring.  He does not have overt evidence of pulmonary sarcoidosis.  Nitric oxide test suggests against asthma but nevertheless would be a good idea to get blood IgE and CBC with differential and allergy profile.  To see if this is an underlying precipitant versus recurrent viral episodes.  Other issues vocal cord dysfunction.  Especially given his  abnormal flow-volume loop in the PFT.  He is amenable to visiting with ENT.  Beyond this I suspect because he is immunosuppressed he does easily gets a viral infection and flares up and then this naturally resolves.  There is no evidence of methotrexate lung toxicity at this point.  He is taking  methotrexate for sarcoid arthralgia.  Because he is immunosuppressed I advised Covid booster shot.    Plan:       Sarcoidosis (HCC) - Plan: POCT EXHALED NITRIC OXIDE Cough Dyspnea on exertion Abnormal flow volume loop   No evidence of active sarcoid in lung No evidence of fibrosis or cancer Very small tiny nodules </= 3 mm - cause not known but could be sarcoid   - you might need a followup CT in 1 year for this There is abnormal flow volume loop pattern on PFT "big breathing test"  Plan - Do blood cbc with diff, IgE RAST allergy profile 03/21/2020   - wil update you with results via my  Chart. If normal, no call will be placed but track on my chart - Refer ENT to ensure no vocal cord issues   Multiple lung nodules on CT </= 12mm  Plan  =    Immunosuppression due to drug therapy [facility]) Vaccine counseling    - Recommend 3rd covid vaccine booster - ok to do pfizer after first 2 being moderna    Followup 1 year or sooner if needed   - will decide on followup CT at that time of face to face followup   (Level 04: Estb 30-39 min   visit type: on-site physical face to visit visit spent in total care time and counseling or/and coordination of care by this undersigned MD - Dr Kalman Shan. This includes one or more of the following on this same day 03/21/2020: pre-charting, chart review, note writing, documentation discussion of test results, diagnostic or treatment recommendations, prognosis, risks and benefits of management options, instructions, education, compliance or risk-factor reduction. It excludes time spent by the CMA or office staff in the care of the patient . Actual time is 30 min)    SIGNATURE    Dr. Kalman Shan, M.D., F.C.C.P,  Pulmonary and Critical Care Medicine Staff Physician, [facility] Director - Interstitial Lung Disease  Program  Pulmonary Fibrosis [facility] [place], [place],  [address]  Pager: [phone], If no answer or between  15:00h - 7:00h: call [phone] Telephone: [phone]  3:46 PM 03/21/2020

## 2020-03-21 NOTE — Progress Notes (Signed)
Full PFT performed today. °

## 2020-04-04 DIAGNOSIS — K219 Gastro-esophageal reflux disease without esophagitis: Secondary | ICD-10-CM | POA: Insufficient documentation

## 2020-04-04 DIAGNOSIS — H9190 Unspecified hearing loss, unspecified ear: Secondary | ICD-10-CM | POA: Diagnosis not present

## 2020-04-04 DIAGNOSIS — L299 Pruritus, unspecified: Secondary | ICD-10-CM | POA: Diagnosis not present

## 2020-04-08 ENCOUNTER — Telehealth: Payer: Self-pay | Admitting: Internal Medicine

## 2020-04-08 NOTE — Telephone Encounter (Signed)
LVM for pt to rtn my call to schedule AWV with [facility]. If pt calls the office please schedule this appt.

## 2020-04-11 ENCOUNTER — Ambulatory Visit: Payer: Medicare Other | Admitting: Internal Medicine

## 2020-04-11 DIAGNOSIS — Z0289 Encounter for other administrative examinations: Secondary | ICD-10-CM

## 2020-05-13 ENCOUNTER — Other Ambulatory Visit: Payer: Self-pay | Admitting: Urology

## 2020-05-24 ENCOUNTER — Telehealth: Payer: Self-pay | Admitting: Pharmacist

## 2020-05-24 NOTE — Progress Notes (Addendum)
Chronic Care Management Pharmacy Assistant   Name: [name]  MRN: [id number] DOB: June 01, 1971  Reason for Encounter: Diabetic Adherence Call   PCP : [name], MD  Allergies:   Allergies  Allergen Reactions   Amlodipine Other (See Comments)    Ankle swelling with 10mg , ok with 5mg  dose   Sulfamethoxazole Rash    fever   Sulfonamide Derivatives Rash and Other (See Comments)    Medications: Outpatient Encounter Medications as of 05/24/2020  Medication Sig   albuterol (VENTOLIN HFA) 108 (90 Base) MCG/ACT inhaler Inhale 2 puffs into the lungs every 6 (six) hours as needed for wheezing or shortness of breath.   allopurinol (ZYLOPRIM) 300 MG tablet TAKE 1 TABLET(300 MG) BY MOUTH DAILY   anastrozole (ARIMIDEX) 1 MG tablet Take 1 mg by mouth daily.   aspirin EC 81 MG tablet Take 1 tablet (81 mg total) by mouth daily.   cholecalciferol (VITAMIN D) 1000 units tablet Take 1,000 Units by mouth daily.   clomiPHENE (CLOMID) 50 MG tablet SMARTSIG:0.5 Tablet(s) By Mouth Every Other Day   colchicine 0.6 MG tablet Take 0.6 mg by mouth every 4 (four) hours as needed (gout pain).   doxycycline (VIBRA-TABS) 100 MG tablet Take 1 tablet (100 mg total) by mouth 2 (two) times daily.   ezetimibe (ZETIA) 10 MG tablet Take 1 tablet (10 mg total) by mouth daily.   fluticasone (FLONASE) 50 MCG/ACT nasal spray Place 2 sprays into both nostrils daily.   folic acid (FOLVITE) 1 MG tablet TK 1 T PO QD   glucose blood (ONETOUCH VERIO) test strip 1 each by Other route daily. And lancets 1/day (Patient taking differently: 1 each by Other route daily. )   irbesartan-hydrochlorothiazide (AVALIDE) 300-12.5 MG tablet TAKE 1 TABLET BY MOUTH DAILY   nitroGLYCERIN (NITROSTAT) 0.4 MG SL tablet Place 1 tablet (0.4 mg total) under the tongue every 5 (five) minutes as needed for chest pain.   omeprazole (PRILOSEC) 40 MG capsule TAKE 1 CAPSULE BY MOUTH TWICE DAILY   ondansetron (ZOFRAN) 4 MG tablet Take 1  tablet (4 mg total) by mouth every 8 (eight) hours as needed for nausea or vomiting.   pioglitazone (ACTOS) 30 MG tablet Take 1 tablet (30 mg total) by mouth daily.   potassium chloride SA (KLOR-CON) 20 MEQ tablet TAKE 1 TABLET(20 MEQ) BY MOUTH TWICE DAILY   repaglinide (PRANDIN) 0.5 MG tablet Take 1 tablet (0.5 mg total) by mouth 3 (three) times daily before meals.   rosuvastatin (CRESTOR) 40 MG tablet TAKE 1 TABLET(40 MG) BY MOUTH DAILY   saw palmetto 500 MG capsule Take 500 mg by mouth daily.   SYNJARDY XR 12.10-998 MG TB24 TAKE 2 TABLETS BY MOUTH DAILY   tadalafil (CIALIS) 20 MG tablet TAKE ONE TABLET BY MOUTH DAILY AS NEEDED   tamsulosin (FLOMAX) 0.4 MG CAPS capsule Take 0.4 mg by mouth daily.   torsemide (DEMADEX) 20 MG tablet TAKE 1 TABLET(20 MG) BY MOUTH TWICE DAILY   [DISCONTINUED] Empagliflozin-metFORMIN HCl ER (SYNJARDY XR) 12.10-998 MG TB24 Take 2 tablets by mouth daily.   No facility-administered encounter medications on file as of 05/24/2020.    Current Diagnosis: Patient Active Problem List   Diagnosis Date Noted   Urinary frequency 03/21/2020   Irritable bowel syndrome with constipation 01/10/2020   Ejection fraction < 50% 09/26/2019   LVH (left ventricular hypertrophy) due to hypertensive disease, with heart failure (HCC) 09/26/2019   Routine general medical examination at a health care  facility 06/14/2018   Hepatic steatosis 11/03/2017   Primary narcolepsy without cataplexy 06/22/2017   Periodic limb movements of sleep 06/22/2017   Hypersomnia 06/22/2017   Nonintractable epilepsy with complex partial seizures (HCC) 03/23/2017   Drug-induced erectile dysfunction 11/06/2015   Body mass index (BMI) of 35.0 to 35.9 with comorbidity    Sarcoidosis (HCC) 09/29/2010   GERD 01/07/2010   Exercise-induced asthma 12/03/2009   POLYNEUROPATHY 10/17/2009   Male hypogonadism 10/08/2009   Diabetes mellitus type 2 in obese (HCC) 10/07/2009   Hyperlipidemia with target LDL less  than 70 10/07/2009   Gout 10/07/2009   Essential hypertension 10/07/2009   CAD (coronary artery disease) 10/07/2009   Male erectile dysfunction 10/07/2009    Goals Addressed   None     Follow-Up:  Pharmacist Review   Recent Relevant Labs: Lab Results  Component Value Date/Time   HGBA1C 7.5 (A) 01/10/2020 03:56 PM   HGBA1C 6.8 (H) 08/29/2019 08:47 AM   HGBA1C 6.4 (A) 04/24/2019 08:03 AM   HGBA1C 7.2 (H) 06/29/2017 09:23 AM   MICROALBUR 1.0 08/29/2019 08:47 AM   MICROALBUR <0.7 06/14/2018 03:56 PM    Kidney Function Lab Results  Component Value Date/Time   CREATININE 0.68 02/11/2020 07:54 PM   CREATININE 0.75 01/09/2020 08:11 AM   CREATININE 0.91 04/07/2016 03:29 PM   GFR 106.83 08/29/2019 08:47 AM   GFRNONAA >60 02/11/2020 07:54 PM   GFRAA >60 02/11/2020 07:54 PM    Current antihyperglycemic regimen: The patient states that he is taking Pioglitazone and Repaglinide, he is no longer taking the Synjardy because of cost   What recent interventions/DTPs have been made to improve glycemic control: The patient is to check his blood sugar once a day and keep a log  Have there been any recent hospitalizations or ED visits since last visit with CPP? The patient states that he has not been to the hospital or the ED since last visit  Patient denies hypoglycemic symptoms Patient reports hyperglycemic symptoms of over 200 after eating   How often are you checking your blood sugar? The patient states that he checks his blood sugar at least once a day before eating  What are your blood sugars ranging?  Fasting: 178 Before meals:  After meals: 280 Bedtime:  During the week, how often does your blood glucose drop below 70? The patient states he does not have a blood sugar lower than 70  Are you checking your feet daily/regularly? The patient states that he does check his feet and he has no cuts, sores or blisters on feet. He dose say that he has neuropathy  Adherence Review: Is  the patient currently on a STATIN medication? Yes, Rosuvastatin Is the patient currently on ACE/ARB medication? Yes, Irbesartan-hydrochlorothiazide Does the patient have >5 day gap between last estimated fill dates? No   Horald Pollen, Clinical Pharmacist Assistant [facility]

## 2020-05-26 ENCOUNTER — Other Ambulatory Visit: Payer: Self-pay | Admitting: Internal Medicine

## 2020-05-26 DIAGNOSIS — K2100 Gastro-esophageal reflux disease with esophagitis, without bleeding: Secondary | ICD-10-CM

## 2020-06-06 ENCOUNTER — Telehealth: Payer: Self-pay | Admitting: Pharmacist

## 2020-06-06 NOTE — Progress Notes (Signed)
Chronic Care Management Pharmacy Assistant   Name: [name]  MRN: [id number] DOB: Aug 24, 1970  Reason for Encounter: Chart Review   PCP : [name], MD  Allergies:   Allergies  Allergen Reactions  . Amlodipine Other (See Comments)    Ankle swelling with 10mg , ok with 5mg  dose  . Sulfamethoxazole Rash    fever  . Sulfonamide Derivatives Rash and Other (See Comments)    Medications: Outpatient Encounter Medications as of 06/06/2020  Medication Sig  . albuterol (VENTOLIN HFA) 108 (90 Base) MCG/ACT inhaler Inhale 2 puffs into the lungs every 6 (six) hours as needed for wheezing or shortness of breath.  . allopurinol (ZYLOPRIM) 300 MG tablet TAKE 1 TABLET(300 MG) BY MOUTH DAILY  . anastrozole (ARIMIDEX) 1 MG tablet Take 1 mg by mouth daily.  aspirin EC 81 MG tablet Take 1 tablet (81 mg total) by mouth daily.  . cholecalciferol (VITAMIN D) 1000 units tablet Take 1,000 Units by mouth daily.  . clomiPHENE (CLOMID) 50 MG tablet SMARTSIG:0.5 Tablet(s) By Mouth Every Other Day  . colchicine 0.6 MG tablet Take 0.6 mg by mouth every 4 (four) hours as needed (gout pain).  06/08/2020 doxycycline (VIBRA-TABS) 100 MG tablet Take 1 tablet (100 mg total) by mouth 2 (two) times daily.  Marland Kitchen ezetimibe (ZETIA) 10 MG tablet Take 1 tablet (10 mg total) by mouth daily.  . fluticasone (FLONASE) 50 MCG/ACT nasal spray Place 2 sprays into both nostrils daily.  . folic acid (FOLVITE) 1 MG tablet TK 1 T PO QD  . glucose blood (ONETOUCH VERIO) test strip 1 each by Other route daily. And lancets 1/day (Patient taking differently: 1 each by Other route daily. )  . irbesartan-hydrochlorothiazide (AVALIDE) 300-12.5 MG tablet TAKE 1 TABLET BY MOUTH DAILY  . nitroGLYCERIN (NITROSTAT) 0.4 MG SL tablet Place 1 tablet (0.4 mg total) under the tongue every 5 (five) minutes as needed for chest pain.  Marland Kitchen omeprazole (PRILOSEC) 40 MG capsule TAKE 1 CAPSULE BY MOUTH TWICE DAILY  . ondansetron (ZOFRAN) 4 MG tablet Take  1 tablet (4 mg total) by mouth every 8 (eight) hours as needed for nausea or vomiting.  . pioglitazone (ACTOS) 30 MG tablet Take 1 tablet (30 mg total) by mouth daily.  . potassium chloride SA (KLOR-CON) 20 MEQ tablet TAKE 1 TABLET(20 MEQ) BY MOUTH TWICE DAILY  . repaglinide (PRANDIN) 0.5 MG tablet Take 1 tablet (0.5 mg total) by mouth 3 (three) times daily before meals.  . rosuvastatin (CRESTOR) 40 MG tablet TAKE 1 TABLET(40 MG) BY MOUTH DAILY  . saw palmetto 500 MG capsule Take 500 mg by mouth daily.  07-07-1979 SYNJARDY XR 12.10-998 MG TB24 TAKE 2 TABLETS BY MOUTH DAILY  . tadalafil (CIALIS) 20 MG tablet TAKE ONE TABLET BY MOUTH DAILY AS NEEDED  . tamsulosin (FLOMAX) 0.4 MG CAPS capsule Take 0.4 mg by mouth daily.  Marland Kitchen torsemide (DEMADEX) 20 MG tablet TAKE 1 TABLET(20 MG) BY MOUTH TWICE DAILY   No facility-administered encounter medications on file as of 06/06/2020.    Current Diagnosis: Patient Active Problem List   Diagnosis Date Noted  . Urinary frequency 03/21/2020  . Irritable bowel syndrome with constipation 01/10/2020  . Ejection fraction < 50% 09/26/2019  . LVH (left ventricular hypertrophy) due to hypertensive disease, with heart failure (HCC) 09/26/2019  . Routine general medical examination at a health care facility 06/14/2018  . Hepatic steatosis 11/03/2017  . Primary narcolepsy without cataplexy 06/22/2017  . Periodic limb  movements of sleep 06/22/2017  . Hypersomnia 06/22/2017  . Nonintractable epilepsy with complex partial seizures (HCC) 03/23/2017  . Drug-induced erectile dysfunction 11/06/2015  . Body mass index (BMI) of 35.0 to 35.9 with comorbidity   . Sarcoidosis (HCC) 09/29/2010  . GERD 01/07/2010  . Exercise-induced asthma 12/03/2009  . POLYNEUROPATHY 10/17/2009  . Male hypogonadism 10/08/2009  . Diabetes mellitus type 2 in obese (HCC) 10/07/2009  . Hyperlipidemia with target LDL less than 70 10/07/2009  . Gout 10/07/2009  . Essential hypertension 10/07/2009  . CAD  (coronary artery disease) 10/07/2009  . Male erectile dysfunction 10/07/2009    Goals Addressed   None     Follow-Up:  Pharmacist Review   Reviewed chart for medication changes and adherence.  No OVs, Consults, or hospital visits since last care coordination call / Pharmacist visit. No medication changes indicated  No gaps in adherence identified. Patient has follow up scheduled with pharmacy team. No further action required.  Spoke with the patient he stated that he was unable to pay for his synjardy XR due to him being in the donut hole between Oct-Dec. After the first of the year he will be able to restart the medication.   Fredric Mare, [facility]  Practice Team Manager/ CPA (Clinical Pharmacist Assistant) [phone]

## 2020-06-16 ENCOUNTER — Other Ambulatory Visit: Payer: Self-pay | Admitting: Family

## 2020-06-16 DIAGNOSIS — I10 Essential (primary) hypertension: Secondary | ICD-10-CM

## 2020-06-24 ENCOUNTER — Other Ambulatory Visit: Payer: Self-pay | Admitting: Internal Medicine

## 2020-06-24 DIAGNOSIS — E669 Obesity, unspecified: Secondary | ICD-10-CM

## 2020-06-24 DIAGNOSIS — I10 Essential (primary) hypertension: Secondary | ICD-10-CM

## 2020-06-24 DIAGNOSIS — E1169 Type 2 diabetes mellitus with other specified complication: Secondary | ICD-10-CM

## 2020-07-02 ENCOUNTER — Other Ambulatory Visit: Payer: Self-pay | Admitting: Internal Medicine

## 2020-07-02 ENCOUNTER — Other Ambulatory Visit: Payer: Self-pay

## 2020-07-02 ENCOUNTER — Telehealth: Payer: Medicare Other | Admitting: Physician Assistant

## 2020-07-02 DIAGNOSIS — E785 Hyperlipidemia, unspecified: Secondary | ICD-10-CM

## 2020-07-02 DIAGNOSIS — I2510 Atherosclerotic heart disease of native coronary artery without angina pectoris: Secondary | ICD-10-CM

## 2020-07-03 ENCOUNTER — Other Ambulatory Visit: Payer: Self-pay | Admitting: Family

## 2020-07-03 ENCOUNTER — Ambulatory Visit: Payer: Medicare Other | Admitting: Family

## 2020-07-04 ENCOUNTER — Telehealth: Payer: Medicare Other

## 2020-07-05 ENCOUNTER — Other Ambulatory Visit: Payer: Self-pay | Admitting: Family

## 2020-07-05 ENCOUNTER — Other Ambulatory Visit: Payer: Self-pay

## 2020-07-05 ENCOUNTER — Inpatient Hospital Stay: Admission: RE | Admit: 2020-07-05 | Payer: Medicare Other | Source: Ambulatory Visit

## 2020-07-05 ENCOUNTER — Encounter: Payer: Self-pay | Admitting: Family

## 2020-07-05 ENCOUNTER — Other Ambulatory Visit (INDEPENDENT_AMBULATORY_CARE_PROVIDER_SITE_OTHER): Payer: Medicare Other

## 2020-07-05 ENCOUNTER — Ambulatory Visit (INDEPENDENT_AMBULATORY_CARE_PROVIDER_SITE_OTHER): Payer: Medicare Other | Admitting: Family

## 2020-07-05 VITALS — BP 130/72 | HR 89 | Temp 98.5°F | Wt 292.6 lb

## 2020-07-05 DIAGNOSIS — R1012 Left upper quadrant pain: Secondary | ICD-10-CM | POA: Diagnosis not present

## 2020-07-05 LAB — CBC WITH DIFFERENTIAL/PLATELET
Basophils Absolute: 0.1 10*3/uL (ref 0.0–0.1)
Basophils Relative: 1.3 % (ref 0.0–3.0)
Eosinophils Absolute: 0.1 10*3/uL (ref 0.0–0.7)
Eosinophils Relative: 1.9 % (ref 0.0–5.0)
HCT: 42.7 % (ref 39.0–52.0)
Hemoglobin: 14.4 g/dL (ref 13.0–17.0)
Lymphocytes Relative: 44.6 % (ref 12.0–46.0)
Lymphs Abs: 2.7 10*3/uL (ref 0.7–4.0)
MCHC: 33.8 g/dL (ref 30.0–36.0)
MCV: 77.5 fl — ABNORMAL LOW (ref 78.0–100.0)
Monocytes Absolute: 0.6 10*3/uL (ref 0.1–1.0)
Monocytes Relative: 9.3 % (ref 3.0–12.0)
Neutro Abs: 2.6 10*3/uL (ref 1.4–7.7)
Neutrophils Relative %: 42.9 % — ABNORMAL LOW (ref 43.0–77.0)
Platelets: 269 10*3/uL (ref 150.0–400.0)
RBC: 5.51 Mil/uL (ref 4.22–5.81)
RDW: 15.1 % (ref 11.5–15.5)
WBC: 6.1 10*3/uL (ref 4.0–10.5)

## 2020-07-05 LAB — COMPREHENSIVE METABOLIC PANEL
ALT: 22 U/L (ref 0–53)
AST: 17 U/L (ref 0–37)
Albumin: 4.5 g/dL (ref 3.5–5.2)
Alkaline Phosphatase: 89 U/L (ref 39–117)
BUN: 13 mg/dL (ref 6–23)
CO2: 29 mEq/L (ref 19–32)
Calcium: 9.8 mg/dL (ref 8.4–10.5)
Chloride: 101 mEq/L (ref 96–112)
Creatinine, Ser: 0.89 mg/dL (ref 0.40–1.50)
GFR: 102 mL/min (ref >60.00)
Glucose, Bld: 157 mg/dL — ABNORMAL HIGH (ref 70–99)
Potassium: 3.6 mEq/L (ref 3.5–5.1)
Sodium: 139 mEq/L (ref 135–145)
Total Bilirubin: 0.4 mg/dL (ref 0.2–1.2)
Total Protein: 8.1 g/dL (ref 6.0–8.3)

## 2020-07-05 LAB — AMYLASE: Amylase: 42 U/L (ref 27–131)

## 2020-07-05 LAB — LIPASE: Lipase: 26 U/L (ref 11.0–59.0)

## 2020-07-05 MED ORDER — PANTOPRAZOLE SODIUM 40 MG PO TBEC: 40.0000 mg | DELAYED_RELEASE_TABLET | Freq: Two times a day (BID) | ORAL | 1 refills | Status: DC

## 2020-07-05 NOTE — Progress Notes (Signed)
[name] is a 50 y.o. male with the following history as recorded in EpicCare:  Patient Active Problem List   Diagnosis Date Noted  . Urinary frequency 03/21/2020  . Irritable bowel syndrome with constipation 01/10/2020  . Ejection fraction < 50% 09/26/2019  . LVH (left ventricular hypertrophy) due to hypertensive disease, with heart failure ([place]) 09/26/2019  . Routine general medical examination at a health care facility 06/14/2018  . Hepatic steatosis 11/03/2017  . Primary narcolepsy without cataplexy 06/22/2017  . Periodic limb movements of sleep 06/22/2017  . Hypersomnia 06/22/2017  . Nonintractable epilepsy with complex partial seizures ([place]) 03/23/2017  . Drug-induced erectile dysfunction 11/06/2015  . Body mass index (BMI) of 35.0 to 35.9 with comorbidity   . Sarcoidosis ([place]) 09/29/2010  . GERD 01/07/2010  . Exercise-induced asthma 12/03/2009  . POLYNEUROPATHY 10/17/2009  . Male hypogonadism 10/08/2009  . Diabetes mellitus type 2 in obese ([place]) 10/07/2009  . Hyperlipidemia with target LDL less than 70 10/07/2009  . Gout 10/07/2009  . Essential hypertension 10/07/2009  . CAD (coronary artery disease) 10/07/2009  . Male erectile dysfunction 10/07/2009    Current Outpatient Medications  Medication Sig Dispense Refill  . albuterol (VENTOLIN HFA) 108 (90 Base) MCG/ACT inhaler Inhale 2 puffs into the lungs every 6 (six) hours as needed for wheezing or shortness of breath. 3 Inhaler 1  . allopurinol (ZYLOPRIM) 300 MG tablet TAKE 1 TABLET(300 MG) BY MOUTH DAILY 90 tablet 0  . anastrozole (ARIMIDEX) 1 MG tablet Take 1 mg by mouth daily.    Marland Kitchen aspirin EC 81 MG tablet Take 1 tablet (81 mg total) by mouth daily. 30 tablet 3  . cholecalciferol (VITAMIN D) 1000 units tablet Take 1,000 Units by mouth daily.    . clomiPHENE (CLOMID) 50 MG tablet SMARTSIG:0.5 Tablet(s) By Mouth Every Other Day    . colchicine 0.6 MG tablet Take 0.6 mg by mouth every 4 (four) hours as needed (gout  pain).    Marland Kitchen ezetimibe (ZETIA) 10 MG tablet Take 1 tablet (10 mg total) by mouth daily. 30 tablet 11  . fluticasone (FLONASE) 50 MCG/ACT nasal spray Place 2 sprays into both nostrils daily. 16 g 6  . folic acid (FOLVITE) 1 MG tablet TK 1 T PO QD  1  . glucose blood (ONETOUCH VERIO) test strip 1 each by Other route daily. And lancets 1/day (Patient taking differently: 1 each by Other route daily.) 100 each 11  . irbesartan-hydrochlorothiazide (AVALIDE) 300-12.5 MG tablet TAKE 1 TABLET BY MOUTH DAILY 90 tablet 1  . nitroGLYCERIN (NITROSTAT) 0.4 MG SL tablet Place 1 tablet (0.4 mg total) under the tongue every 5 (five) minutes as needed for chest pain. 25 tablet 3  . omeprazole (PRILOSEC) 40 MG capsule TAKE 1 CAPSULE BY MOUTH TWICE DAILY 180 capsule 0  . ondansetron (ZOFRAN) 4 MG tablet Take 1 tablet (4 mg total) by mouth every 8 (eight) hours as needed for nausea or vomiting. 6 tablet 0  . pioglitazone (ACTOS) 30 MG tablet Take 1 tablet (30 mg total) by mouth daily. 90 tablet 3  . Potassium Chloride ER 20 MEQ TBCR TAKE 1 TABLET(20 MEQ) BY MOUTH TWICE DAILY 180 tablet 0  . repaglinide (PRANDIN) 0.5 MG tablet Take 1 tablet (0.5 mg total) by mouth 3 (three) times daily before meals. 90 tablet 11  . rosuvastatin (CRESTOR) 40 MG tablet TAKE 1 TABLET(40 MG) BY MOUTH DAILY 90 tablet 1  . saw palmetto 500 MG capsule Take 500 mg by mouth  daily.    Marland Kitchen SYNJARDY XR 12.10-998 MG TB24 TAKE 2 TABLETS BY MOUTH DAILY 180 tablet 0  . tadalafil (CIALIS) 20 MG tablet TAKE ONE TABLET BY MOUTH DAILY AS NEEDED 10 tablet 3  . tamsulosin (FLOMAX) 0.4 MG CAPS capsule Take 0.4 mg by mouth daily.    Marland Kitchen torsemide (DEMADEX) 20 MG tablet TAKE 1 TABLET(20 MG) BY MOUTH TWICE DAILY 180 tablet 2  . pantoprazole (PROTONIX) 40 MG tablet TAKE 1 TABLET(40 MG) BY MOUTH TWICE DAILY 180 tablet 0   No current facility-administered medications for this visit.    Allergies: Amlodipine, Sulfamethoxazole, and Sulfonamide derivatives  Past  Medical History:  Diagnosis Date  . Anginal pain ([place])   . Arthritis    ? 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 Followed by Dr. Trudie Reed  . Arthritis    "ankles" (04/13/2016)  . Asthma   . CORONARY ARTERY DISEASE    a. cath 05/2014: mild LAD disease, normal LCx and RCA. Risk factor modification recommended.  Marland Kitchen DIABETES MELLITUS, TYPE II   . Edema   . ERECTILE DYSFUNCTION, ORGANIC   . GERD   . Gout, unspecified   . Heart murmur   . HYPERLIPIDEMIA   . HYPERTENSION   . 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 Pericarditis    recurrent  . POLYNEUROPATHY   . PULMONARY SARCOIDOSIS    Mediastinal lymphadenopathy with biospy proven sarcodosis  . Seizures ([place])    "none in 4-5 years; don't know what kind; not related to alcohol" (04/13/2016)  . TESTICULAR HYPOFUNCTION   . TIA (transient ischemic attack)    "I don't remember when" (04/13/2016)    Past Surgical History:  Procedure Laterality Date  . BRONCHOSCOPY  08/21/08  . CARDIAC CATHETERIZATION  06/25/2009   minimal disease  . CARDIAC CATHETERIZATION N/A 04/14/2016   Procedure: Left Heart Cath and Coronary Angiography;  Surgeon: Burnell Blanks, MD;  Location: [facility];  Service: Cardiovascular;  Laterality: N/A;  . CHOLECYSTECTOMY N/A 09/21/2017   Procedure: LAPAROSCOPIC CHOLECYSTECTOMY WITH INTRAOPERATIVE CHOLANGIOGRAM;  Surgeon: Excell Seltzer, MD;  Location: [facility];  Service: General;  Laterality: N/A;  . LEFT HEART CATHETERIZATION WITH CORONARY ANGIOGRAM N/A 05/18/2014   Procedure: LEFT HEART CATHETERIZATION WITH CORONARY ANGIOGRAM;  Surgeon: Jettie Booze, MD;  Location[facility];  Service: Cardiovascular;  Laterality: N/A;  . MEDIASTINOSCOPY  11/30/08    Family History  Problem Relation Age of Onset  . Diabetes Mother   . Hypertension Mother   . Diabetes Father   . Heart disease Father 25       Fatal MI  . Heart failure Father     Social History    Tobacco Use  . Smoking status: Former Smoker    Packs/day: 1.00    Years: 10.00    Pack years: 10.00    Types: Cigarettes    Quit date: 05/18/2009    Years since quitting: 11.1  . Smokeless tobacco: Never Used  Substance Use Topics  . Alcohol use: Yes    Alcohol/week: 0.0 standard drinks    Comment: 04/13/2016 "glass of wine a couple times/month"    Subjective:  LUQ pain x1 week- worsening in past few days; notes it feel like the pain he had prior to having his gallbladder out; notes has been having intermittent abdominal issues for the past 6 months;  Went to ER with abdominal pain ( RLQ pain at that time) in August 2021- normal  CT at that time; Feels like his stools are "softer than normal";  No fever; vomiting after eating; no dark stools; Omeprazole is on his medication list- unclear if he is taking; has not had baseline colonoscopy;    Objective:  Vitals:   07/05/20 0846  BP: 130/72  Pulse: 89  Temp: 98.5 F (36.9 C)  TempSrc: Oral  SpO2: 97%  Weight: 292 lb 9.6 oz (132.7 kg)    General: Well developed, well nourished, in no acute distress  Skin : Warm and dry.  Head: Normocephalic and atraumatic  Eyes: Sclera and conjunctiva clear; pupils round and reactive to light; extraocular movements intact  Ears: External normal; canals clear; tympanic membranes normal  Oropharynx: Pink, supple. No suspicious lesions  Neck: Supple without thyromegaly, adenopathy  Lungs: Respirations unlabored;  Abdomen: Soft; nontender; nondistended;  Neurologic: Alert and oriented; speech intact; face symmetrical; moves all extremities well; CNII-XII intact without focal deficit   Assessment:  1. LUQ pain     Plan:  Check CBC, CMP, amylase, lipase today- results are all normal; we were able to get outpatient CT scheduled for patient today but he could not make the test due to his work commitments; he will call and re-schedule for Monday hopefully;  Try changing to Protonix 40 mg bid;  will most likely need to refer back to GI; follow-up to be determined;  This visit occurred during the SARS-CoV-2 public health emergency.  Safety protocols were in place, including screening questions prior to the visit, additional usage of staff PPE, and extensive cleaning of exam room while observing appropriate contact time as indicated for disinfecting solutions.     No follow-ups on file.  Orders Placed This Encounter  Procedures  . CT Abdomen Pelvis W Contrast    Lori/LY- BC/BS MCR. NPR DM and HTN BUN 13 CRE 0.89 07/05/20    Standing Status:   Future    Standing Expiration Date:   07/05/2021    Order Specific Question:   If indicated for the ordered procedure, I authorize the administration of contrast media per Radiology protocol    Answer:   Yes    Order Specific Question:   Preferred imaging location?    Answer:   [place]    Order Specific Question:   Is Oral Contrast requested for this exam?    Answer:   Yes, Per Radiology protocol  . CBC with Differential/Platelet    Standing Status:   Future    Number of Occurrences:   1    Standing Expiration Date:   07/05/2021  . Comp Met (CMET)    Standing Status:   Future    Number of Occurrences:   1    Standing Expiration Date:   07/05/2021  . Amylase    Standing Status:   Future    Number of Occurrences:   1    Standing Expiration Date:   07/05/2021  . Lipase    Standing Status:   Future    Number of Occurrences:   1    Standing Expiration Date:   07/05/2021    Requested Prescriptions    No prescriptions requested or ordered in this encounter

## 2020-07-08 ENCOUNTER — Ambulatory Visit (INDEPENDENT_AMBULATORY_CARE_PROVIDER_SITE_OTHER)
Admission: RE | Admit: 2020-07-08 | Discharge: 2020-07-08 | Disposition: A | Discharge status: Home / Self Care | Payer: Medicare Other | Source: Ambulatory Visit | Attending: Family | Admitting: Family

## 2020-07-08 ENCOUNTER — Other Ambulatory Visit: Payer: Self-pay

## 2020-07-08 DIAGNOSIS — R1012 Left upper quadrant pain: Secondary | ICD-10-CM

## 2020-07-08 MED ORDER — IOHEXOL 300 MG/ML  SOLN
100.0000 mL | Freq: Once | INTRAMUSCULAR | Status: AC | PRN
  Administered 2020-07-08: 100 mL via INTRAVENOUS

## 2020-07-09 ENCOUNTER — Other Ambulatory Visit: Payer: Self-pay | Admitting: Family

## 2020-07-09 ENCOUNTER — Ambulatory Visit: Payer: Medicare Other

## 2020-07-09 ENCOUNTER — Telehealth: Payer: Self-pay | Admitting: Internal Medicine

## 2020-07-09 DIAGNOSIS — R1012 Left upper quadrant pain: Secondary | ICD-10-CM

## 2020-07-09 NOTE — Telephone Encounter (Signed)
Patient called and said that [facility] would not be able to get him an appointment until 2.20.22. He was wondering if there was anything that could be prescribed for him. It can be sent to [id number] - [place], [place] - [address] AT [facility] OF [address]. Patient saw NP on 1.28.22. Please advise.

## 2020-07-09 NOTE — Progress Notes (Signed)
m °

## 2020-07-10 NOTE — Telephone Encounter (Signed)
Pt notified of FNP response.  States he has an appt on this Sunday now & is appreciative of FNP referral.  Verb understanding on holding pain meds until more answers re: his pancreas.

## 2020-07-10 NOTE — Telephone Encounter (Signed)
Unfortunately, there is not a specific medication I can give him until we can get a better picture of the pancreas. Let me see if my referral team can get him in somewhere else quicker.

## 2020-07-14 ENCOUNTER — Ambulatory Visit
Admission: RE | Admit: 2020-07-14 | Discharge: 2020-07-14 | Disposition: A | Discharge status: Home / Self Care | Payer: Medicare Other | Source: Ambulatory Visit | Attending: Family | Admitting: Family

## 2020-07-14 ENCOUNTER — Other Ambulatory Visit: Payer: Self-pay

## 2020-07-14 DIAGNOSIS — K760 Fatty (change of) liver, not elsewhere classified: Secondary | ICD-10-CM | POA: Diagnosis not present

## 2020-07-14 DIAGNOSIS — R1012 Left upper quadrant pain: Secondary | ICD-10-CM

## 2020-07-14 DIAGNOSIS — Z9049 Acquired absence of other specified parts of digestive tract: Secondary | ICD-10-CM | POA: Diagnosis not present

## 2020-07-14 DIAGNOSIS — N281 Cyst of kidney, acquired: Secondary | ICD-10-CM | POA: Diagnosis not present

## 2020-07-14 MED ORDER — GADOBENATE DIMEGLUMINE 529 MG/ML IV SOLN
20.0000 mL | Freq: Once | INTRAVENOUS | Status: AC | PRN
  Administered 2020-07-14: 20 mL via INTRAVENOUS

## 2020-07-15 ENCOUNTER — Other Ambulatory Visit: Payer: Self-pay | Admitting: Family

## 2020-07-15 ENCOUNTER — Other Ambulatory Visit: Payer: Self-pay | Admitting: Internal Medicine

## 2020-07-15 ENCOUNTER — Telehealth: Payer: Medicare Other

## 2020-07-15 DIAGNOSIS — E1169 Type 2 diabetes mellitus with other specified complication: Secondary | ICD-10-CM

## 2020-07-15 DIAGNOSIS — R1012 Left upper quadrant pain: Secondary | ICD-10-CM

## 2020-07-15 NOTE — Progress Notes (Deleted)
Chronic Care Management Pharmacy Note  07/15/2020 Name:  [name] MRN:  [id number] DOB:  December 14, 1970  Subjective: [name] is an 50 y.o. year old male who is a primary patient of Janith Lima, MD.  The CCM team was consulted for assistance with disease management and care coordination needs.    Engaged with patient by telephone for follow up visit in response to provider referral for pharmacy case management and/or care coordination services.   Consent to Services:  The patient was given the following information about Chronic Care Management services today, agreed to services, and gave verbal consent: 1. CCM service includes personalized support from designated clinical staff supervised by the primary care provider, including individualized plan of care and coordination with other care providers 2. 24/7 contact phone numbers for assistance for urgent and routine care needs. 3. Service will only be billed when office clinical staff spend 20 minutes or more in a month to coordinate care. 4. Only one practitioner may furnish and bill the service in a calendar month. 5.The patient may stop CCM services at any time (effective at the end of the month) by phone call to the office staff. 6. The patient will be responsible for cost sharing (co-pay) of up to 20% of the service fee (after annual deductible is met). Patient agreed to services and consent obtained.  Patient Care Team: Janith Lima, MD as PCP - General (Internal Medicine) Dorothy Spark, MD as PCP - Cardiology (Cardiology) Kittie Plater as Consulting Physician (Rheumatology) Mosetta Anis, MD as Referring Physician (Rheumatology) Chesley Mires, MD as Consulting Physician (Pulmonary Disease) Renato Shin, MD as Consulting Physician (Endocrinology) Dorothy Spark, MD (Cardiology) Charlton Haws, [facility] as Pharmacist (Pharmacist)  Recent office visits: ***  Recent consult  visits: ***  Objective:  Lab Results  Component Value Date   CREATININE 0.89 07/05/2020   BUN 13 07/05/2020   GFR 102.00 07/05/2020   GFRNONAA >60 02/11/2020   GFRAA >60 02/11/2020   NA 139 07/05/2020   K 3.6 07/05/2020   CALCIUM 9.8 07/05/2020   CO2 29 07/05/2020    Lab Results  Component Value Date/Time   HGBA1C 7.5 (A) 01/10/2020 03:56 PM   HGBA1C 6.8 (H) 08/29/2019 08:47 AM   HGBA1C 6.4 (A) 04/24/2019 08:03 AM   HGBA1C 7.2 (H) 06/29/2017 09:23 AM   GFR 102.00 07/05/2020 09:17 AM   GFR 106.83 08/29/2019 08:47 AM   MICROALBUR 1.0 08/29/2019 08:47 AM   MICROALBUR <0.7 06/14/2018 03:56 PM    Last diabetic Eye exam:  Lab Results  Component Value Date/Time   HMDIABEYEEXA No Retinopathy 10/26/2019 12:00 AM    Last diabetic Foot exam:  Lab Results  Component Value Date/Time   HMDIABFOOTEX Done 04/10/2010 12:00 AM     Lab Results  Component Value Date   CHOL 83 (L) 09/20/2019   HDL 32 (L) 09/20/2019   LDLCALC 37 09/20/2019   TRIG 57 09/20/2019   CHOLHDL 2.6 09/20/2019    Hepatic Function Latest Ref Rng & Units 07/05/2020 01/09/2020 08/29/2019  Total Protein 6.0 - 8.3 g/dL 8.1 7.6 7.5  Albumin 3.5 - 5.2 g/dL 4.5 4.0 4.2  AST 0 - 37 U/L $Remo'17 18 18  'tIrCR$ ALT 0 - 53 U/L $Remo'22 19 19  'dpoIm$ Alk Phosphatase 39 - 117 U/L 89 77 84  Total Bilirubin 0.2 - 1.2 mg/dL 0.4 0.3 0.4  Bilirubin, Direct 0.0 - 0.3 mg/dL - - 0.1    Lab Results  Component Value  Date/Time   TSH 0.62 06/14/2018 03:56 PM   TSH 0.66 03/01/2017 12:05 PM   FREET4 0.7 11/13/2008 11:12 AM    CBC Latest Ref Rng & Units 07/05/2020 02/11/2020 01/09/2020  WBC 4.0 - 10.5 K/uL 6.1 8.5 6.5  Hemoglobin 13.0 - 17.0 g/dL 14.4 15.2 14.3  Hematocrit 39.0 - 52.0 % 42.7 45.0 41.7  Platelets 150.0 - 400.0 K/uL 269.0 249 241    No results found for: VD25OH  Clinical ASCVD: {YES/NO:21197} The ASCVD Risk score Mikey Bussing DC Jr., et al., 2013) failed to calculate for the following reasons:   The valid total cholesterol range is 130 to 320  mg/dL    Depression screen [facility] 2/9 08/29/2019 06/14/2018 01/18/2017  Decreased Interest 0 0 0  Down, Depressed, Hopeless 0 0 1  PHQ - 2 Score 0 0 1  Altered sleeping 1 - 1  Tired, decreased energy 0 - 0  Change in appetite 0 - 0  Feeling bad or failure about yourself  0 - 0  Trouble concentrating 0 - 0  Moving slowly or fidgety/restless 0 - 0  Suicidal thoughts 0 - 0  PHQ-9 Score 1 - 2  Difficult doing work/chores Not difficult at all - Not difficult at all  Some recent data might be hidden     Social History   Tobacco Use  Smoking Status Former Smoker  . Packs/day: 1.00  . Years: 10.00  . Pack years: 10.00  . Types: Cigarettes  . Quit date: 05/18/2009  . Years since quitting: 11.1  Smokeless Tobacco Never Used   BP Readings from Last 3 Encounters:  07/05/20 130/72  03/21/20 136/82  03/21/20 110/70   Pulse Readings from Last 3 Encounters:  07/05/20 89  03/21/20 92  03/21/20 75   Wt Readings from Last 3 Encounters:  07/05/20 292 lb 9.6 oz (132.7 kg)  03/21/20 282 lb (127.9 kg)  03/21/20 283 lb (128.4 kg)    Assessment/Interventions: Review of patient past medical history, allergies, medications, health status, including review of consultants reports, laboratory and other test data, was performed as part of comprehensive evaluation and provision of chronic care management services.   SDOH:  (Social Determinants of Health) assessments and interventions performed:    CCM Care Plan  Allergies  Allergen Reactions  . Amlodipine Other (See Comments)    Ankle swelling with $RemoveBefor'10mg'JMDLfbvuqyDd$ , ok with $Remov'5mg'dhcJns$  dose  . Sulfamethoxazole Rash    fever  . Sulfonamide Derivatives Rash and Other (See Comments)    Medications Reviewed Today    Reviewed by Earnstine Regal, RMA (Registered Medical Assistant) on 07/05/20 at [phone]  Med List Status: <None>  Medication Order Taking? Sig Documenting Provider Last Dose Status Informant  albuterol (VENTOLIN HFA) 108 (90 Base) MCG/ACT inhaler [id number]  Yes Inhale 2 puffs into the lungs every 6 (six) hours as needed for wheezing or shortness of breath. Janith Lima, MD Taking Active Self  allopurinol (ZYLOPRIM) 300 MG tablet [id number] Yes TAKE 1 TABLET(300 MG) BY MOUTH DAILY Janith Lima, MD Taking Active   anastrozole (ARIMIDEX) 1 MG tablet [id number] Yes Take 1 mg by mouth daily. [provider] Taking Active   aspirin EC 81 MG tablet [id number] Yes Take 1 tablet (81 mg total) by mouth daily. Rowe Clack, MD Taking Active Self  cholecalciferol (VITAMIN D) 1000 units tablet [id number] Yes Take 1,000 Units by mouth daily. [provider] Taking Active Self  clomiPHENE (CLOMID) 50 MG tablet [id number] Yes SMARTSIG:0.5 Tablet(s) By Mouth Every  Other Day [provider] Taking Active   colchicine 0.6 MG tablet [id number] Yes Take 0.6 mg by mouth every 4 (four) hours as needed (gout pain). [provider] Taking Active Self           Med Note Charlton Haws   Tue Aug 29, 2019 11:32 AM)    ezetimibe (ZETIA) 10 MG tablet [id number] Yes Take 1 tablet (10 mg total) by mouth daily. Jettie Booze, MD Taking Active   fluticasone [facility]) 50 MCG/ACT nasal spray [id number] Yes Place 2 sprays into both nostrils daily. Marrian Salvage, FNP Taking Active   folic acid (FOLVITE) 1 MG tablet [id number] Yes TK 1 T PO QD [provider] Taking Active Self           Med Note Sandford Craze   Mon Jun 28, 2017  8:49 AM)    glucose blood [facility] VERIO) test strip [id number] Yes 1 each by Other route daily. And lancets 1/day  Patient taking differently: 1 each by Other route daily.   Renato Shin, MD Taking Active   irbesartan-hydrochlorothiazide (AVALIDE) 300-12.5 MG tablet [id number] Yes TAKE 1 TABLET BY MOUTH DAILY Janith Lima, MD Taking Active   nitroGLYCERIN (NITROSTAT) 0.4 MG SL tablet [id number] Yes Place 1 tablet (0.4 mg total) under the tongue every 5 (five) minutes as needed for chest  pain. Lyda Jester M, PA-C Taking Active Self  omeprazole (PRILOSEC) 40 MG capsule [id number] Yes TAKE 1 CAPSULE BY MOUTH TWICE DAILY Janith Lima, MD Taking Active   ondansetron [facility]) 4 MG tablet [id number] Yes Take 1 tablet (4 mg total) by mouth every 8 (eight) hours as needed for nausea or vomiting. Palumbo, April, MD Taking Active   pioglitazone (ACTOS) 30 MG tablet [id number] Yes Take 1 tablet (30 mg total) by mouth daily. Renato Shin, MD Taking Active   Potassium Chloride ER 20 MEQ TBCR [id number] Yes TAKE 1 TABLET(20 MEQ) BY MOUTH TWICE DAILY Janith Lima, MD Taking Active   repaglinide (PRANDIN) 0.5 MG tablet [id number] Yes Take 1 tablet (0.5 mg total) by mouth 3 (three) times daily before meals. Renato Shin, MD Taking Active   rosuvastatin (CRESTOR) 40 MG tablet [id number] Yes TAKE 1 TABLET(40 MG) BY MOUTH DAILY Janith Lima, MD Taking Active   saw palmetto 500 MG capsule [id number] Yes Take 500 mg by mouth daily. [provider] Taking Active   SYNJARDY XR 12.10-998 MG TB24 [id number] Yes TAKE 2 TABLETS BY MOUTH DAILY Janith Lima, MD Taking Active   tadalafil (CIALIS) 20 MG tablet [id number] Yes TAKE ONE TABLET BY MOUTH DAILY AS NEEDED McKenzie, Candee Furbish, MD Taking Active   tamsulosin [facility]) 0.4 MG CAPS capsule [id number] Yes Take 0.4 mg by mouth daily. Cleon Gustin, MD Taking Active   torsemide (DEMADEX) 20 MG tablet [id number] Yes TAKE 1 TABLET(20 MG) BY MOUTH TWICE DAILY Dorothy Spark, MD Taking Active           Patient Active Problem List   Diagnosis Date Noted  . Urinary frequency 03/21/2020  . Irritable bowel syndrome with constipation 01/10/2020  . Ejection fraction < 50% 09/26/2019  . LVH (left ventricular hypertrophy) due to hypertensive disease, with heart failure ([place]) 09/26/2019  . Routine general medical examination at a health care facility 06/14/2018  . Hepatic steatosis 11/03/2017  . Primary narcolepsy without cataplexy  06/22/2017  . Periodic limb movements of sleep 06/22/2017  . Hypersomnia 06/22/2017  . Nonintractable epilepsy with complex  partial seizures ([place]) 03/23/2017  . Drug-induced erectile dysfunction 11/06/2015  . Body mass index (BMI) of 35.0 to 35.9 with comorbidity   . Sarcoidosis ([place]) 09/29/2010  . GERD 01/07/2010  . Exercise-induced asthma 12/03/2009  . POLYNEUROPATHY 10/17/2009  . Male hypogonadism 10/08/2009  . Diabetes mellitus type 2 in obese ([place]) 10/07/2009  . Hyperlipidemia with target LDL less than 70 10/07/2009  . Gout 10/07/2009  . Essential hypertension 10/07/2009  . CAD (coronary artery disease) 10/07/2009  . Male erectile dysfunction 10/07/2009    Immunization History  Administered Date(s) Administered  . Influenza Split 04/06/2011, 02/17/2012, 03/16/2012  . Influenza,inj,Quad PF,6+ Mos 03/21/2013, 03/08/2014, 04/03/2015, 02/19/2016, 02/17/2017, 06/14/2018, 02/20/2019, 03/21/2020  . Moderna Sars-Covid-2 Vaccination 07/31/2019, 09/05/2019  . Pneumococcal Conjugate-13 04/29/2015  . Pneumococcal Polysaccharide-23 11/06/2015  . Tdap 03/21/2013    Conditions to be addressed/monitored:  {CCM ASSESSMENT DZ OPTIONS:25047}  There are no care plans that you recently modified to display for this patient.    Medication Assistance: None required.  Patient affirms current coverage meets needs.  Patient's preferred pharmacy is:  [id number] [address], [place] [address] Phone: [phone] Fax: [phone]  [facility] [address] [place] [address] Phone: [phone] Fax: [phone]  Uses pill box? {Yes or If no, why not?:20788} Pt endorses ***% compliance  We discussed: {Pharmacy options:24294} Patient decided to: {[facility] Pharmacy ZJIR:67893}  Follow Up:  Patient agrees to Care Plan and  Follow-up.  Plan: {CM FOLLOW UP PLAN:25073}  ***  Current Barriers:  . {pharmacybarriers:24917} . ***  Pharmacist Clinical Goal(s):  Marland Kitchen Over the next *** days, patient will {PHARMACYGOALCHOICES:24921} through collaboration with PharmD and provider.  . ***  Interventions: . 1:1 collaboration with Janith Lima, MD regarding development and update of comprehensive plan of care as evidenced by provider attestation and co-signature . Inter-disciplinary care team collaboration (see longitudinal plan of care) . Comprehensive medication review performed; medication list updated in electronic medical record  Hypertension (BP goal <130/80) -{CHL Controlled/Uncontrolled:208 461 1970} -Current treatment: . *** -Medications previously tried: ***  -Current home readings: *** -Current dietary habits: *** -Current exercise habits: *** -{ACTIONS;DENIES/REPORTS:21021675::"Denies"} hypotensive/hypertensive symptoms -Educated on BP goals and benefits of medications for prevention of heart attack, stroke and kidney damage; Daily salt intake goal < 2300 mg; Exercise goal of 150 minutes per week; -Counseled to monitor BP at home ***, document, and provide log at future appointments -{CCMPHARMDINTERVENTION:25122}  Hyperlipidemia: (LDL goal < ***) -{CHL Controlled/Uncontrolled:208 461 1970} -Current treatment: . *** -Medications previously tried: ***  -Current dietary patterns: *** -Current exercise habits: *** -Educated on {CCM HLD Counseling:25126} -{CCMPHARMDINTERVENTION:25122}  Diabetes (A1c goal {A1c goals:23924}) -{CHL Controlled/Uncontrolled:208 461 1970} -Current medications: . *** -Medications previously tried: ***  -Current home glucose readings . fasting glucose: *** . post prandial glucose: *** -{ACTIONS;DENIES/REPORTS:21021675::"Denies"} hypoglycemic/hyperglycemic symptoms -Current meal patterns:  . breakfast: ***  . lunch: ***  . dinner: *** . snacks: *** . drinks:  *** -Current exercise: *** -Educated on{CCM DM COUNSELING:25123} -Counseled to check feet daily and get yearly eye exams -{CCMPHARMDINTERVENTION:25122}    Patient Goals/Self-Care Activities . Over the next *** days, patient will:  - {pharmacypatientgoals:24919}  Follow Up Plan: Telephone follow up appointment with care management team member scheduled for:

## 2020-07-17 DIAGNOSIS — R948 Abnormal results of function studies of other organs and systems: Secondary | ICD-10-CM | POA: Diagnosis not present

## 2020-07-17 DIAGNOSIS — E291 Testicular hypofunction: Secondary | ICD-10-CM | POA: Diagnosis not present

## 2020-07-22 ENCOUNTER — Other Ambulatory Visit: Payer: Self-pay

## 2020-07-22 ENCOUNTER — Ambulatory Visit (INDEPENDENT_AMBULATORY_CARE_PROVIDER_SITE_OTHER): Payer: Medicare Other

## 2020-07-22 VITALS — BP 128/80 | HR 88 | Temp 98.3°F | Ht 77.0 in | Wt 295.8 lb

## 2020-07-22 DIAGNOSIS — Z0000 Encounter for general adult medical examination without abnormal findings: Secondary | ICD-10-CM

## 2020-07-22 NOTE — Progress Notes (Addendum)
Subjective:   [name] is a 50 y.o. male who presents for Medicare Annual/Subsequent preventive examination.  Review of Systems    No ROS. Medicare Wellness Visit. Additional risk factors are reflected in social history. Cardiac Risk Factors include: advanced age (>70men, >34 women);diabetes mellitus;dyslipidemia;family history of premature cardiovascular disease;hypertension;male gender;obesity (BMI >30kg/m2)     Objective:    Today's Vitals   07/22/20 0826  BP: 128/80  Pulse: 88  Temp: 98.3 F (36.8 C)  SpO2: 98%  Weight: 295 lb 12.8 oz (134.2 kg)  Height: 6\' 5"  (1.956 m)  PainSc: 0-No pain   Body mass index is 35.08 kg/m.  Advanced Directives 07/22/2020 02/11/2020 01/09/2020 09/20/2017 09/19/2017 09/15/2017 01/18/2017  Does Patient Have a Medical Advance Directive? No No No No No No No  Would patient like information on creating a medical advance directive? No - Patient declined No - Patient declined - No - Patient declined - No - Patient declined Yes (ED - Information included in AVS)  Some encounter information is confidential and restricted. Go to Review Flowsheets activity to see all data.    Current Medications (verified) Outpatient Encounter Medications as of 07/22/2020  Medication Sig   albuterol (VENTOLIN HFA) 108 (90 Base) MCG/ACT inhaler Inhale 2 puffs into the lungs every 6 (six) hours as needed for wheezing or shortness of breath.   allopurinol (ZYLOPRIM) 300 MG tablet TAKE 1 TABLET(300 MG) BY MOUTH DAILY   anastrozole (ARIMIDEX) 1 MG tablet Take 1 mg by mouth daily.   aspirin EC 81 MG tablet Take 1 tablet (81 mg total) by mouth daily.   cholecalciferol (VITAMIN D) 1000 units tablet Take 1,000 Units by mouth daily.   clomiPHENE (CLOMID) 50 MG tablet SMARTSIG:0.5 Tablet(s) By Mouth Every Other Day   colchicine 0.6 MG tablet Take 0.6 mg by mouth every 4 (four) hours as needed (gout pain).   ezetimibe (ZETIA) 10 MG tablet Take 1 tablet (10 mg total) by mouth  daily.   fluticasone (FLONASE) 50 MCG/ACT nasal spray Place 2 sprays into both nostrils daily.   folic acid (FOLVITE) 1 MG tablet TK 1 T PO QD   glucose blood (ONETOUCH VERIO) test strip 1 each by Other route daily. And lancets 1/day (Patient taking differently: 1 each by Other route daily.)   irbesartan-hydrochlorothiazide (AVALIDE) 300-12.5 MG tablet TAKE 1 TABLET BY MOUTH DAILY   nitroGLYCERIN (NITROSTAT) 0.4 MG SL tablet Place 1 tablet (0.4 mg total) under the tongue every 5 (five) minutes as needed for chest pain.   omeprazole (PRILOSEC) 40 MG capsule TAKE 1 CAPSULE BY MOUTH TWICE DAILY   ondansetron (ZOFRAN) 4 MG tablet Take 1 tablet (4 mg total) by mouth every 8 (eight) hours as needed for nausea or vomiting.   pantoprazole (PROTONIX) 40 MG tablet TAKE 1 TABLET(40 MG) BY MOUTH TWICE DAILY   pioglitazone (ACTOS) 30 MG tablet Take 1 tablet (30 mg total) by mouth daily.   Potassium Chloride ER 20 MEQ TBCR TAKE 1 TABLET(20 MEQ) BY MOUTH TWICE DAILY   repaglinide (PRANDIN) 0.5 MG tablet Take 1 tablet (0.5 mg total) by mouth 3 (three) times daily before meals.   rosuvastatin (CRESTOR) 40 MG tablet TAKE 1 TABLET(40 MG) BY MOUTH DAILY   saw palmetto 500 MG capsule Take 500 mg by mouth daily.   SYNJARDY XR 12.10-998 MG TB24 TAKE 2 TABLETS BY MOUTH DAILY   tadalafil (CIALIS) 20 MG tablet TAKE ONE TABLET BY MOUTH DAILY AS NEEDED   tamsulosin (FLOMAX)  0.4 MG CAPS capsule Take 0.4 mg by mouth daily.   torsemide (DEMADEX) 20 MG tablet TAKE 1 TABLET(20 MG) BY MOUTH TWICE DAILY   No facility-administered encounter medications on file as of 07/22/2020.    Allergies (verified) Amlodipine, Sulfamethoxazole, and Sulfonamide derivatives   History: Past Medical History:  Diagnosis Date   Anginal pain (HCC)    Arthritis    ? 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 Followed by Dr. Nickola Major   Arthritis    "ankles" (04/13/2016)   Asthma    CORONARY ARTERY DISEASE    a. cath 05/2014: mild LAD  disease, normal LCx and RCA. Risk factor modification recommended.   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)    MSLT 01/09/09 & MRI brain 01/09/09   Pericarditis    recurrent   POLYNEUROPATHY    PULMONARY SARCOIDOSIS    Mediastinal lymphadenopathy with biospy proven sarcodosis   Seizures (HCC)    "none in 4-5 years; don't know what kind; not related to alcohol" (04/13/2016)   TESTICULAR HYPOFUNCTION    TIA (transient ischemic attack)    "I don't remember when" (04/13/2016)   Past Surgical History:  Procedure Laterality Date   BRONCHOSCOPY  08/21/08   CARDIAC CATHETERIZATION  06/25/2009   minimal disease   CARDIAC CATHETERIZATION N/A 04/14/2016   Procedure: Left Heart Cath and Coronary Angiography;  Surgeon: Kathleene Hazel, MD;  Location: [facility] INVASIVE CV LAB;  Service: Cardiovascular;  Laterality: N/A;   CHOLECYSTECTOMY N/A 09/21/2017   Procedure: LAPAROSCOPIC CHOLECYSTECTOMY WITH INTRAOPERATIVE CHOLANGIOGRAM;  Surgeon: Glenna Fellows, MD;  Location: [facility] ORS;  Service: General;  Laterality: N/A;   LEFT HEART CATHETERIZATION WITH CORONARY ANGIOGRAM N/A 05/18/2014   Procedure: LEFT HEART CATHETERIZATION WITH CORONARY ANGIOGRAM;  Surgeon: Corky Crafts, MD;  Location: [facility] CATH LAB;  Service: Cardiovascular;  Laterality: N/A;   MEDIASTINOSCOPY  11/30/08   Family History  Problem Relation Age of Onset   Diabetes Mother    Hypertension Mother    Diabetes Father    Heart disease Father 43       Fatal MI   Heart failure Father    Social History   Socioeconomic History   Marital status: Significant Other    Spouse name: Not on file   Number of children: 3   Years of education: Not on file   Highest education level: Not on file  Occupational History   Occupation: Unemployed  Tobacco Use   Smoking status: Former Smoker     Packs/day: 1.00    Years: 10.00    Pack years: 10.00    Types: Cigarettes    Quit date: 05/18/2009    Years since quitting: 11.1   Smokeless tobacco: Never Used  Vaping Use   Vaping Use: Never used  Substance and Sexual Activity   Alcohol use: Yes    Alcohol/week: 0.0 standard drinks    Comment: 04/13/2016 "glass of wine a couple times/month"   Drug use: No   Sexual activity: Yes  Other Topics Concern   Not on file  Social History Narrative   Married, lives with wife and 3 kids.    Currently student. 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 [employer]   Social Determinants of [employer]: Low Risk    Difficulty  of Paying Living Expenses: Not hard at all  Food Insecurity: No Food Insecurity   Worried About Running Out of Food in the Last Year: Never true   Ran Out of Food in the Last Year: Never true  Transportation Needs: No Transportation Needs   Lack of Transportation (Medical): No   Lack of Transportation (Non-Medical): No  Physical Activity: Inactive   Days of Exercise per Week: 0 days   Minutes of Exercise per Session: 0 min  Stress: No Stress Concern Present   Feeling of Stress : Not at all  Social Connections: Socially Integrated   Frequency of Communication with Friends and Family: More than three times a week   Frequency of Social Gatherings with Friends and Family: More than three times a week   Attends Religious Services: More than 4 times per year   Active Member of [employer] or Organizations: No   Attends Engineer, structural: More than 4 times per year   Marital Status: Living with partner    Tobacco Counseling Counseling given: Not Answered   Clinical Intake:  Pre-visit preparation completed: Yes  Pain : No/denies pain Pain Score: 0-No pain     BMI - recorded: 35.08 Nutritional Status: BMI > 30  Obese Nutritional Risks: None Diabetes: Yes CBG done?: No Did pt. bring in CBG monitor from home?: No  How often do you  need to have someone help you when you read instructions, pamphlets, or other written materials from your doctor or pharmacy?: 1 - Never What is the last grade level you completed in school?: 2 years of college  Diabetic? yes  Interpreter Needed?: No  Information entered by :: Susie Cassette, LPN   Activities of Daily Living In your present state of health, do you have any difficulty performing the following activities: 07/22/2020  Hearing? N  Vision? N  Difficulty concentrating or making decisions? N  Walking or climbing stairs? N  Dressing or bathing? N  Doing errands, shopping? N  Preparing Food and eating ? N  Using the Toilet? N  In the past six months, have you accidently leaked urine? N  Do you have problems with loss of bowel control? N  Managing your Medications? N  Managing your Finances? N  Housekeeping or managing your Housekeeping? N  Some recent data might be hidden    Patient Care Team: [name], MD as PCP - General (Internal Medicine) Lars Masson, MD as PCP - Cardiology (Cardiology) Virgilio Frees as Consulting Physician (Rheumatology) Loyal Gambler, MD as Referring Physician (Rheumatology) [name], MD as Consulting Physician (Pulmonary Disease) Romero Belling, MD as Consulting Physician (Endocrinology) Lars Masson, MD (Cardiology) Kathyrn Sheriff, [facility] as Pharmacist (Pharmacist)  Indicate any recent Medical Services you may have received from other than [facility] providers in the past year (date may be approximate).     Assessment:   This is a routine wellness examination for [place].  Hearing/Vision screen No exam data present  Dietary issues and exercise activities discussed: Current Exercise Habits: The patient has a physically strenuous job, but has no regular exercise apart from work., Exercise limited by: cardiac condition(s);respiratory conditions(s);orthopedic condition(s)  Goals      lose weight     Increase  exercise and activity to daily either going to gym or house-hold tasks, eat smaller portions and make healthier choices.     Patient Stated     To lose 30-40 pounds.     Pharmacy Care Plan  CARE PLAN ENTRY (see longitudinal plan of care for additional care plan information)  Current Barriers:  Chronic Disease Management support, education, and care coordination needs related to Hypertension, Hyperlipidemia, Diabetes, and Constipation   Hypertension BP Readings from Last 3 Encounters:  02/12/20 105/65  01/25/20 126/76  01/10/20 112/70  Pharmacist Clinical Goal(s): Over the next 180 days, patient will work with PharmD and providers to maintain BP goal <130/80 Current regimen:  irbesartan-HCTZ 300-12.5 mg daily,  torsemide 20 mg daily,  potassium chloride 20 meq - 2 tabs daily  Interventions: Discussed BP goals and benefits of medications for prevention of heart attack / stroke Patient self care activities - Over the next 180 days, patient will: Check BP daily, document, and provide at future appointments Ensure daily salt intake < 2300 mg/day  Hyperlipidemia Lab Results  Component Value Date/Time   LDLCALC 37 09/20/2019 07:46 AM  Pharmacist Clinical Goal(s): Over the next 180 days, patient will work with PharmD and providers to maintain LDL goal < 70 Current regimen:  rosuvastatin 40 mg daily,  ezetimibe 10 mg daily,  nitroglycerin 0.4 mg as needed aspirin 81 mg daily    Interventions: Discussed cholesterol goals and benefits of medications for prevention of heart attack / stroke Patient self care activities - Over the next 180 days, patient will: Continue medication as prescribed  Diabetes Lab Results  Component Value Date/Time   HGBA1C 7.5 (A) 01/10/2020 03:56 PM   HGBA1C 6.8 (H) 08/29/2019 08:47 AM   HGBA1C 6.4 (A) 04/24/2019 08:03 AM   HGBA1C 7.2 (H) 06/29/2017 09:23 AM  Pharmacist Clinical Goal(s): Over the next 180 days, patient will work with PharmD and  providers to achieve A1c goal <7% Current regimen:  Synjardy 12.10-998 mg - 2 tablets daily,  pioglitazone 30 mg daily,  repaglinide 0.5 mg TID with meals  Interventions: Discussed blood sugar goals and benefits of medications for prevention of diabetic complications Discussed history of pancreatitis limits use of other classes of diabetes medications (GLP-1 and DPP-IV) Discussed ways to improve diet  Patient self care activities - Over the next 180 days, patient will: Check blood sugar once daily and in the morning before eating or drinking, document, and provide at future appointments Contact provider with any episodes of hypoglycemia Switch to diet sodas/juices and drink more water  Constipation Pharmacist Clinical Goal(s) Over the next 180 days, patient will work with PharmD and providers to optimize therapy Current regimen:  Trulance 3 mg as needed Senna-docusate as needed Interventions: Discussed benefits of taking daily stool softener and increasing fiber intake Patient self care activities - Over the next 180 days, patient will: Take stool softener daily and increase fiber intake  Medication management Pharmacist Clinical Goal(s): Over the next 180 days, patient will work with PharmD and providers to maintain optimal medication adherence Current pharmacy: [facility] Interventions Comprehensive medication review performed. Continue current medication management strategy Patient self care activities - Over the next 180 days, patient will: Focus on medication adherence by pill box Take medications as prescribed Report any questions or concerns to PharmD and/or provider(s)  Please see past updates related to this goal by clicking on the "Past Updates" button in the selected goal        Depression Screen PHQ 2/9 Scores 07/22/2020 08/29/2019 06/14/2018 01/18/2017  PHQ - 2 Score 0 0 0 1  PHQ- 9 Score - 1 - 2    Fall Risk Fall Risk  07/22/2020 02/20/2019 06/14/2018 01/18/2017   Falls in the past year? 0  0 0 No  Number falls in past yr: 0 0 0 -  Injury with Fall? 0 0 0 -  Risk for fall due to : No Fall Risks - - -  Follow up - Falls evaluation completed Falls evaluation completed -    FALL RISK PREVENTION PERTAINING TO THE HOME:  Any stairs in or around the home? Yes  If so, are there any without handrails? No  Home free of loose throw rugs in walkways, pet beds, electrical cords, etc? Yes  Adequate lighting in your home to reduce risk of falls? Yes   ASSISTIVE DEVICES UTILIZED TO PREVENT FALLS:  Life alert? No  Use of a cane, walker or w/c? No  Grab bars in the bathroom? Yes  Shower chair or bench in shower? No  Elevated toilet seat or a handicapped toilet? No   TIMED UP AND GO:  Was the test performed? No .  Length of time to ambulate 10 feet: 0 sec.   Gait steady and fast without use of assistive device  Cognitive Function: Normal cognitive status assessed by direct observation by this Nurse Health Advisor. No abnormalities found.          Immunizations Immunization History  Administered Date(s) Administered   Influenza Split 04/06/2011, 02/17/2012, 03/16/2012   Influenza,inj,Quad PF,6+ Mos 03/21/2013, 03/08/2014, 04/03/2015, 02/19/2016, 02/17/2017, 06/14/2018, 02/20/2019, 03/21/2020   Moderna Sars-Covid-2 Vaccination 07/31/2019, 09/05/2019, 04/19/2020   Pneumococcal Conjugate-13 04/29/2015   Pneumococcal Polysaccharide-23 11/06/2015   Tdap 03/21/2013    TDAP status: Up to date  Flu Vaccine status: Up to date  Pneumococcal vaccine status: Up to date  Covid-19 vaccine status: Completed vaccines  Qualifies for Shingles Vaccine? No   Zostavax completed No   Shingrix Completed?: No.    Education has been provided regarding the importance of this vaccine. Patient has been advised to call insurance company to determine out of pocket expense if they have not yet received this vaccine. Advised may also receive vaccine at local pharmacy or  Health Dept. Verbalized acceptance and understanding.  Screening Tests Health Maintenance  Topic Date Due   Hepatitis C Screening  Never done   COLONOSCOPY (Pts 45-18yrs Insurance coverage will need to be confirmed)  Never done   HEMOGLOBIN A1C  07/12/2020   FOOT EXAM  08/28/2020   COVID-19 Vaccine (4 - Booster for Moderna series) 10/17/2020   OPHTHALMOLOGY EXAM  10/25/2020   TETANUS/TDAP  03/22/2023   INFLUENZA VACCINE  Completed   PNEUMOCOCCAL POLYSACCHARIDE VACCINE AGE 49-64 HIGH RISK  Completed   HIV Screening  Completed    Health Maintenance  Health Maintenance Due  Topic Date Due   Hepatitis C Screening  Never done   COLONOSCOPY (Pts 45-87yrs Insurance coverage will need to be confirmed)  Never done   HEMOGLOBIN A1C  07/12/2020    Colorectal cancer screening: Referral to GI placed 07/22/2020. Pt aware the office will call re: appt.  Lung Cancer Screening: (Low Dose CT Chest recommended if Age 58-80 years, 30 pack-year currently smoking OR have quit w/in 15years.) does not qualify.   Lung Cancer Screening Referral: no  Additional Screening:  Hepatitis C Screening: does qualify; Completed no  Vision Screening: Recommended annual ophthalmology exams for early detection of glaucoma and other disorders of the eye. Is the patient up to date with their annual eye exam?  Yes  Who is the provider or what is the name of the office in which the patient attends annual eye exams? Howard McFarland, OD. If pt  is not established with a provider, would they like to be referred to a provider to establish care? No .   Dental Screening: Recommended annual dental exams for proper oral hygiene  Community Resource Referral / Chronic Care Management: CRR required this visit?  No   CCM required this visit?  No      Plan:     I have personally reviewed and noted the following in the patient's chart:   Medical and social history Use of alcohol, tobacco or illicit drugs  Current  medications and supplements Functional ability and status Nutritional status Physical activity Advanced directives List of other physicians Hospitalizations, surgeries, and ER visits in previous 12 months Vitals Screenings to include cognitive, depression, and falls Referrals and appointments  In addition, I have reviewed and discussed with patient certain preventive protocols, quality metrics, and best practice recommendations. A written personalized care plan for preventive services as well as general preventive health recommendations were provided to patient.     [name], LPN   01/03/210   Nurse Notes: n/a  Medical screening examination/treatment/procedure(s) were performed by non-physician practitioner and as supervising physician I was immediately available for consultation/collaboration.  I agree with above. Jacinta Shoe, MD

## 2020-07-22 NOTE — Patient Instructions (Addendum)
[name] , Thank you for taking time to come for your Medicare Wellness Visit. I appreciate your ongoing commitment to your health goals. Please review the following plan we discussed and let me know if I can assist you in the future.   Screening recommendations/referrals: Colonoscopy: never done; referral placed for screening  Recommended yearly ophthalmology/optometry visit for glaucoma screening and checkup Recommended yearly dental visit for hygiene and checkup  Vaccinations: Influenza vaccine: 03/21/2020 Pneumococcal vaccine: 04/29/2015, 11/06/2015 Tdap vaccine: 03/21/2013; due every 10 years Shingles vaccine: never done   Covid-19: 07/31/2019, 09/05/2019, 04/19/2020  Advanced directives: Advance directive discussed with you today. Even though you declined this today please call our office should you change your mind and we can give you the proper paperwork for you to fill out.  Conditions/risks identified: Yes; Reviewed health maintenance screenings with patient today and relevant education, vaccines, and/or referrals were provided. Please continue to do your personal lifestyle choices by: daily care of teeth and gums, regular physical activity (goal should be 5 days a week for 30 minutes), eat a healthy diet, avoid tobacco and drug use, limiting any alcohol intake, taking a low-dose aspirin (if not allergic or have been advised by your provider otherwise) and taking vitamins and minerals as recommended by your provider. Continue doing brain stimulating activities (puzzles, reading, adult coloring books, staying active) to keep memory sharp. Continue to eat heart healthy diet (full of fruits, vegetables, whole grains, lean protein, water--limit salt, fat, and sugar intake) and increase physical activity as tolerated.  Next appointment: Please schedule your next Medicare Wellness Visit with your Nurse Health Advisor in 1 year by calling [phone].   Preventive Care 40-64 Years,  Male Preventive care refers to lifestyle choices and visits with your health care provider that can promote health and wellness. What does preventive care include?  A yearly physical exam. This is also called an annual well check.  Dental exams once or twice a year.  Routine eye exams. Ask your health care provider how often you should have your eyes checked.  Personal lifestyle choices, including:  Daily care of your teeth and gums.  Regular physical activity.  Eating a healthy diet.  Avoiding tobacco and drug use.  Limiting alcohol use.  Practicing safe sex.  Taking low-dose aspirin every day starting at age 54. What happens during an annual well check? The services and screenings done by your health care provider during your annual well check will depend on your age, overall health, lifestyle risk factors, and family history of disease. Counseling  Your health care provider may ask you questions about your:  Alcohol use.  Tobacco use.  Drug use.  Emotional well-being.  Home and relationship well-being.  Sexual activity.  Eating habits.  Work and work Astronomer. Screening  You may have the following tests or measurements:  Height, weight, and BMI.  Blood pressure.  Lipid and cholesterol levels. These may be checked every 5 years, or more frequently if you are over 35 years old.  Skin check.  Lung cancer screening. You may have this screening every year starting at age 70 if you have a 30-pack-year history of smoking and currently smoke or have quit within the past 15 years.  Fecal occult blood test (FOBT) of the stool. You may have this test every year starting at age 12.  Flexible sigmoidoscopy or colonoscopy. You may have a sigmoidoscopy every 5 years or a colonoscopy every 10 years starting at age 25.  Prostate cancer screening.  Recommendations will vary depending on your family history and other risks.  Hepatitis C blood test.  Hepatitis B  blood test.  Sexually transmitted disease (STD) testing.  Diabetes screening. This is done by checking your blood sugar (glucose) after you have not eaten for a while (fasting). You may have this done every 1-3 years. Discuss your test results, treatment options, and if necessary, the need for more tests with your health care provider. Vaccines  Your health care provider may recommend certain vaccines, such as:  Influenza vaccine. This is recommended every year.  Tetanus, diphtheria, and acellular pertussis (Tdap, Td) vaccine. You may need a Td booster every 10 years.  Zoster vaccine. You may need this after age 8.  Pneumococcal 13-valent conjugate (PCV13) vaccine. You may need this if you have certain conditions and have not been vaccinated.  Pneumococcal polysaccharide (PPSV23) vaccine. You may need one or two doses if you smoke cigarettes or if you have certain conditions. Talk to your health care provider about which screenings and vaccines you need and how often you need them. This information is not intended to replace advice given to you by your health care provider. Make sure you discuss any questions you have with your health care provider. Document Released: 06/21/2015 Document Revised: 02/12/2016 Document Reviewed: 03/26/2015 Elsevier Interactive Patient Education  2017 [employer].  Fall Prevention in the Home Falls can cause injuries. They can happen to people of all ages. There are many things you can do to make your home safe and to help prevent falls. What can I do on the outside of my home?  Regularly fix the edges of walkways and driveways and fix any cracks.  Remove anything that might make you trip as you walk through a door, such as a raised step or threshold.  Trim any bushes or trees on the path to your home.  Use bright outdoor lighting.  Clear any walking paths of anything that might make someone trip, such as rocks or tools.  Regularly check to see if  handrails are loose or broken. Make sure that both sides of any steps have handrails.  Any raised decks and porches should have guardrails on the edges.  Have any leaves, snow, or ice cleared regularly.  Use sand or salt on walking paths during winter.  Clean up any spills in your garage right away. This includes oil or grease spills. What can I do in the bathroom?  Use night lights.  Install grab bars by the toilet and in the tub and shower. Do not use towel bars as grab bars.  Use non-skid mats or decals in the tub or shower.  If you need to sit down in the shower, use a plastic, non-slip stool.  Keep the floor dry. Clean up any water that spills on the floor as soon as it happens.  Remove soap buildup in the tub or shower regularly.  Attach bath mats securely with double-sided non-slip rug tape.  Do not have throw rugs and other things on the floor that can make you trip. What can I do in the bedroom?  Use night lights.  Make sure that you have a light by your bed that is easy to reach.  Do not use any sheets or blankets that are too big for your bed. They should not hang down onto the floor.  Have a firm chair that has side arms. You can use this for support while you get dressed.  Do not have  throw rugs and other things on the floor that can make you trip. What can I do in the kitchen?  Clean up any spills right away.  Avoid walking on wet floors.  Keep items that you use a lot in easy-to-reach places.  If you need to reach something above you, use a strong step stool that has a grab bar.  Keep electrical cords out of the way.  Do not use floor polish or wax that makes floors slippery. If you must use wax, use non-skid floor wax.  Do not have throw rugs and other things on the floor that can make you trip. What can I do with my stairs?  Do not leave any items on the stairs.  Make sure that there are handrails on both sides of the stairs and use them. Fix  handrails that are broken or loose. Make sure that handrails are as long as the stairways.  Check any carpeting to make sure that it is firmly attached to the stairs. Fix any carpet that is loose or worn.  Avoid having throw rugs at the top or bottom of the stairs. If you do have throw rugs, attach them to the floor with carpet tape.  Make sure that you have a light switch at the top of the stairs and the bottom of the stairs. If you do not have them, ask someone to add them for you. What else can I do to help prevent falls?  Wear shoes that:  Do not have high heels.  Have rubber bottoms.  Are comfortable and fit you well.  Are closed at the toe. Do not wear sandals.  If you use a stepladder:  Make sure that it is fully opened. Do not climb a closed stepladder.  Make sure that both sides of the stepladder are locked into place.  Ask someone to hold it for you, if possible.  Clearly mark and make sure that you can see:  Any grab bars or handrails.  First and last steps.  Where the edge of each step is.  Use tools that help you move around (mobility aids) if they are needed. These include:  Canes.  Walkers.  Scooters.  Crutches.  Turn on the lights when you go into a dark area. Replace any light bulbs as soon as they burn out.  Set up your furniture so you have a clear path. Avoid moving your furniture around.  If any of your floors are uneven, fix them.  If there are any pets around you, be aware of where they are.  Review your medicines with your doctor. Some medicines can make you feel dizzy. This can increase your chance of falling. Ask your doctor what other things that you can do to help prevent falls. This information is not intended to replace advice given to you by your health care provider. Make sure you discuss any questions you have with your health care provider. Document Released: 03/21/2009 Document Revised: 10/31/2015 Document Reviewed:  06/29/2014 Elsevier Interactive Patient Education  2017 [employer].

## 2020-07-23 ENCOUNTER — Telehealth: Payer: Self-pay | Admitting: Pharmacist

## 2020-07-23 NOTE — Chronic Care Management (AMB) (Signed)
Chronic Care Management Pharmacy Assistant   Name: [name]  MRN: [id number] DOB: [birth year]  Reason for Encounter: Disease State/ Diabetes Adherence Call  PCP : [name], MD  Allergies:   Allergies  Allergen Reactions   Amlodipine Other (See Comments)    Ankle swelling with 10mg , ok with 5mg  dose   Sulfamethoxazole Rash    fever   Sulfonamide Derivatives Rash and Other (See Comments)    Medications: Outpatient Encounter Medications as of 07/23/2020  Medication Sig   albuterol (VENTOLIN HFA) 108 (90 Base) MCG/ACT inhaler Inhale 2 puffs into the lungs every 6 (six) hours as needed for wheezing or shortness of breath.   allopurinol (ZYLOPRIM) 300 MG tablet TAKE 1 TABLET(300 MG) BY MOUTH DAILY   anastrozole (ARIMIDEX) 1 MG tablet Take 1 mg by mouth daily.   aspirin EC 81 MG tablet Take 1 tablet (81 mg total) by mouth daily.   cholecalciferol (VITAMIN D) 1000 units tablet Take 1,000 Units by mouth daily.   clomiPHENE (CLOMID) 50 MG tablet SMARTSIG:0.5 Tablet(s) By Mouth Every Other Day   colchicine 0.6 MG tablet Take 0.6 mg by mouth every 4 (four) hours as needed (gout pain).   ezetimibe (ZETIA) 10 MG tablet Take 1 tablet (10 mg total) by mouth daily.   fluticasone (FLONASE) 50 MCG/ACT nasal spray Place 2 sprays into both nostrils daily.   folic acid (FOLVITE) 1 MG tablet TK 1 T PO QD   glucose blood (ONETOUCH VERIO) test strip 1 each by Other route daily. And lancets 1/day (Patient taking differently: 1 each by Other route daily.)   irbesartan-hydrochlorothiazide (AVALIDE) 300-12.5 MG tablet TAKE 1 TABLET BY MOUTH DAILY   nitroGLYCERIN (NITROSTAT) 0.4 MG SL tablet Place 1 tablet (0.4 mg total) under the tongue every 5 (five) minutes as needed for chest pain.   omeprazole (PRILOSEC) 40 MG capsule TAKE 1 CAPSULE BY MOUTH TWICE DAILY   ondansetron (ZOFRAN) 4 MG tablet Take 1 tablet (4 mg total) by mouth every 8 (eight) hours as needed for nausea or  vomiting.   pantoprazole (PROTONIX) 40 MG tablet TAKE 1 TABLET(40 MG) BY MOUTH TWICE DAILY   pioglitazone (ACTOS) 30 MG tablet Take 1 tablet (30 mg total) by mouth daily.   Potassium Chloride ER 20 MEQ TBCR TAKE 1 TABLET(20 MEQ) BY MOUTH TWICE DAILY   repaglinide (PRANDIN) 0.5 MG tablet Take 1 tablet (0.5 mg total) by mouth 3 (three) times daily before meals.   rosuvastatin (CRESTOR) 40 MG tablet TAKE 1 TABLET(40 MG) BY MOUTH DAILY   saw palmetto 500 MG capsule Take 500 mg by mouth daily.   SYNJARDY XR 12.10-998 MG TB24 TAKE 2 TABLETS BY MOUTH DAILY   tadalafil (CIALIS) 20 MG tablet TAKE ONE TABLET BY MOUTH DAILY AS NEEDED   tamsulosin (FLOMAX) 0.4 MG CAPS capsule Take 0.4 mg by mouth daily.   torsemide (DEMADEX) 20 MG tablet TAKE 1 TABLET(20 MG) BY MOUTH TWICE DAILY   No facility-administered encounter medications on file as of 07/23/2020.    Current Diagnosis: Patient Active Problem List   Diagnosis Date Noted   Urinary frequency 03/21/2020   Irritable bowel syndrome with constipation 01/10/2020   Ejection fraction < 50% 09/26/2019   LVH (left ventricular hypertrophy) due to hypertensive disease, with heart failure (HCC) 09/26/2019   Routine general medical examination at a health care facility 06/14/2018   Hepatic steatosis 11/03/2017   Primary narcolepsy without cataplexy 06/22/2017   Periodic limb movements of sleep 06/22/2017  Hypersomnia 06/22/2017   Nonintractable epilepsy with complex partial seizures (HCC) 03/23/2017   Drug-induced erectile dysfunction 11/06/2015   Body mass index (BMI) of 35.0 to 35.9 with comorbidity    Sarcoidosis (HCC) 09/29/2010   GERD 01/07/2010   Exercise-induced asthma 12/03/2009   POLYNEUROPATHY 10/17/2009   Male hypogonadism 10/08/2009   Diabetes mellitus type 2 in obese (HCC) 10/07/2009   Hyperlipidemia with target LDL less than 70 10/07/2009   Gout 10/07/2009   Essential hypertension 10/07/2009   CAD  (coronary artery disease) 10/07/2009   Male erectile dysfunction 10/07/2009    Recent Relevant Labs: Lab Results  Component Value Date/Time   HGBA1C 7.5 (A) 01/10/2020 03:56 PM   HGBA1C 6.8 (H) 08/29/2019 08:47 AM   HGBA1C 6.4 (A) 04/24/2019 08:03 AM   HGBA1C 7.2 (H) 06/29/2017 09:23 AM   MICROALBUR 1.0 08/29/2019 08:47 AM   MICROALBUR <0.7 06/14/2018 03:56 PM    Kidney Function Lab Results  Component Value Date/Time   CREATININE 0.89 07/05/2020 09:17 AM   CREATININE 0.68 02/11/2020 07:54 PM   CREATININE 0.91 04/07/2016 03:29 PM   GFR 102.00 07/05/2020 09:17 AM   GFRNONAA >60 02/11/2020 07:54 PM   GFRAA >60 02/11/2020 07:54 PM     Current antihyperglycemic regimen:  o Pioglitazone (ACTOS) 30 mg tablet daily o Repaglinide 0.5 mg tablet three times daily before meals o Synjardy XR 12.10-998 mg tablet twice daily   What recent interventions/DTPs have been made to improve glycemic control:  o No recent changes, patient states he is currently taking his prescribed medications.   Have there been any recent hospitalizations or ED visits since last visit with CPP? No , patient has not had any recent hospitalizations or ED visits.   Patient denies hypoglycemic symptoms, including Pale, Sweaty, Shaky, Hungry, Nervous/irritable and Vision changes    Patient denies hyperglycemic symptoms, including blurry vision, excessive thirst, fatigue, polyuria and weakness    How often are you checking your blood sugar? Patient states he is not checking his blood sugars at home at this time.   What are your blood sugars ranging?  o Fasting: n/a o Before meals: n/a o After meals: n/a o Bedtime: n/a   During the week, how often does your blood glucose drop below 70? Never , that he is aware of.   Are you checking your feet daily/regularly? Yes, patient states he checks his feet regularly.  Adherence Review: Is the patient currently on a STATIN medication? Yes Is the patient  currently on ACE/ARB medication? Yes Does the patient have >5 day gap between last estimated fill dates? No  Would you like to pursue patient assistance for Synjardy?  Patient states he would not like to pursue patient assistance at this time. Patient states he may chose to later in the year if he is to fall in the donut hole.  Are you still taking Methotrexate for Sarcoidosis?  Patient states he has not taken Methotrexate for ~6 months.  -Patient would like to know if there are other alternative medications to treat his diabetes other than what he is currently taking. Patient states he hates to have to take three different medications and would like to know if there is one medication he can take that will cover all 3 such as Ozempic or Trulicity? Please advise.  April D Calhoun, [facility] Clinical Pharmacist Assistant [phone]   Follow-Up:  Pharmacist Review

## 2020-07-24 DIAGNOSIS — E291 Testicular hypofunction: Secondary | ICD-10-CM | POA: Diagnosis not present

## 2020-07-24 DIAGNOSIS — N5201 Erectile dysfunction due to arterial insufficiency: Secondary | ICD-10-CM | POA: Diagnosis not present

## 2020-07-26 ENCOUNTER — Telehealth: Payer: Self-pay | Admitting: Physician Assistant

## 2020-07-26 ENCOUNTER — Ambulatory Visit: Payer: Medicare Other

## 2020-07-26 DIAGNOSIS — R002 Palpitations: Secondary | ICD-10-CM

## 2020-07-26 DIAGNOSIS — I10 Essential (primary) hypertension: Secondary | ICD-10-CM | POA: Diagnosis not present

## 2020-07-26 DIAGNOSIS — E119 Type 2 diabetes mellitus without complications: Secondary | ICD-10-CM | POA: Diagnosis not present

## 2020-07-26 DIAGNOSIS — I25118 Atherosclerotic heart disease of native coronary artery with other forms of angina pectoris: Secondary | ICD-10-CM | POA: Diagnosis not present

## 2020-07-26 NOTE — Telephone Encounter (Signed)
Order placed for 2 week ZIO per KN, MD.   Patient made aware, informed patient he will be contacted shortly with instructions by our monitor team.   Encouraged patient to keep a daily log of BP/HR as well as daily weights and bring them to upcoming appointment with Dr. Shari Prows on Tuesday so that she will have additional data to review. He verbalized understanding.

## 2020-07-26 NOTE — Telephone Encounter (Signed)
Patient c/o Palpitations:  High priority if patient c/o lightheadedness, shortness of breath, or chest pain  1) How long have you had palpitations/irregular HR/ Afib? Are you having the symptoms now? Yes  2) Are you currently experiencing lightheadedness, SOB or CP? Little shortness of breath  3) Do you have a history of afib (atrial fibrillation) or irregular heart rhythm? no  4) Have you checked your BP or HR? (document readings if available): good  5) Are you experiencing any other symptoms? A little shortness of breath- Pt would like to be seen by somebody today- he said he did not want to go to the ER

## 2020-07-26 NOTE — Telephone Encounter (Signed)
Please obtain 2 week zio patch monitor, thank you

## 2020-07-26 NOTE — Telephone Encounter (Signed)
Patient calling in stating that he has been experiencing palpitations more frequently. Patient states he typically feels them at night but they have been happening more often during the day.   Recent BP 120/80 HR 72--patient states his HR feels like it has double beats and then slows down again.   He gets lightheaded at times but the episodes are short lived. Patient is concerned because he has gained about 20-30 pounds since December and he has not changed his diet.   Patient denies any swelling at this time but states by end of day his feet and lower legs will be swollen making his socks difficult to get off. Once he goes to bed/evelvates legs then swelling decreases.   Patient reports being exertionally SOB on occasion. Denies any SOB at this time. Denies CP, N/V, diaphoresis.   Patient reports being compliant with all medications. Will route to Dr. Delton See for advisement.

## 2020-07-28 ENCOUNTER — Other Ambulatory Visit: Payer: Medicare Other

## 2020-07-28 NOTE — Progress Notes (Deleted)
Cardiology Office Note:    Date:  07/28/2020   ID:  [name], DOB 07-Jun-1971, MRN [id number]  PCP:  [name], MD   [facility]  Cardiologist:  Tobias Alexander, MD *** Advanced Practice Provider:  No care team member to display Electrophysiologist:  None   Referring MD: [name], MD    History of Present Illness:    [name] is a 50 y.o. male with a hx of nonobstructive CAD on cath in 05/2014, HTN, HLD, DMII, RA and pulmonary sarcoid who was previously followed by Dr. Delton See who now presents to clinic for follow-up.  Last saw [name] on 06/20/19 where he was having occasional palpitations but improved from prior. Holter monitor ordered by PCP 11/2018 for fast heart rates showed normal sinus rhythm, sinus tachycardia rare PACs average heart rate 90 bpm no pathologic arrhythmias.  Saw PCP in 08/2019 where he was having DOE. Myoview was negative for ischemia but EF 47%. Subsequent TTE with LVEF 55-60% with no valvular abnormalities.  Past Medical History:  Diagnosis Date  . Anginal pain (HCC)   . Arthritis    ? 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 Followed by Dr. Nickola Major  . Arthritis    "ankles" (04/13/2016)  . Asthma   . CORONARY ARTERY DISEASE    a. cath 05/2014: mild LAD disease, normal LCx and RCA. Risk factor modification recommended.  Marland Kitchen DIABETES MELLITUS, TYPE II   . Edema   . ERECTILE DYSFUNCTION, ORGANIC   . GERD   . Gout, unspecified   . Heart murmur   . HYPERLIPIDEMIA   . HYPERTENSION   . 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 Pericarditis    recurrent  . POLYNEUROPATHY   . PULMONARY SARCOIDOSIS    Mediastinal lymphadenopathy with biospy proven sarcodosis  . Seizures (HCC)    "none in 4-5 years; don't know what kind; not related to alcohol" (04/13/2016)  . TESTICULAR HYPOFUNCTION   . TIA (transient ischemic attack)    "I don't remember when" (04/13/2016)     Past Surgical History:  Procedure Laterality Date  . BRONCHOSCOPY  08/21/08  . CARDIAC CATHETERIZATION  06/25/2009   minimal disease  . CARDIAC CATHETERIZATION N/A 04/14/2016   Procedure: Left Heart Cath and Coronary Angiography;  Surgeon: Kathleene Hazel, MD;  Location: [facility] INVASIVE CV LAB;  Service: Cardiovascular;  Laterality: N/A;  . CHOLECYSTECTOMY N/A 09/21/2017   Procedure: LAPAROSCOPIC CHOLECYSTECTOMY WITH INTRAOPERATIVE CHOLANGIOGRAM;  Surgeon: Glenna Fellows, MD;  Location: [facility] ORS;  Service: General;  Laterality: N/A;  . LEFT HEART CATHETERIZATION WITH CORONARY ANGIOGRAM N/A 05/18/2014   Procedure: LEFT HEART CATHETERIZATION WITH CORONARY ANGIOGRAM;  Surgeon: Corky Crafts, MD;  Location: [facility] CATH LAB;  Service: Cardiovascular;  Laterality: N/A;  . MEDIASTINOSCOPY  11/30/08    Current Medications: No outpatient medications have been marked as taking for the 07/30/20 encounter (Appointment) with Xavier Sprague, MD.     Allergies:   Amlodipine, Sulfamethoxazole, and Sulfonamide derivatives   Social History   Socioeconomic History  . Marital status: Significant Other    Spouse name: Not on file  . Number of children: 3  . Years of education: Not on file  . Highest education level: Not on file  Occupational History  . Occupation: Unemployed  Tobacco Use  . Smoking status: Former Smoker    Packs/day: 1.00    Years: 10.00    Pack years: 10.00  Types: Cigarettes    Quit date: 05/18/2009    Years since quitting: 11.2  . Smokeless tobacco: Never Used  Vaping Use  . Vaping Use: Never used  Substance and Sexual Activity  . Alcohol use: Yes    Alcohol/week: 0.0 standard drinks    Comment: 04/13/2016 "glass of wine a couple times/month"  . Drug use: No  . Sexual activity: Yes  Other Topics Concern  . Not on file  Social History Narrative   Married, lives with wife and 3 kids.    Currently student. 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 Difficulty of Paying Living Expenses: Not hard at all  Food Insecurity: No Food Insecurity  . Worried About Programme researcher, broadcasting/film/video in the Last Year: Never true  . Ran Out of Food in the Last Year: Never true  Transportation Needs: No Transportation Needs  . Lack of Transportation (Medical): No  . Lack of Transportation (Non-Medical): No  Physical Activity: Inactive  . Days of Exercise per Week: 0 days  . Minutes of Exercise per Session: 0 min  Stress: No Stress Concern Present  . Feeling of Stress : Not at all  Social Connections: Socially Integrated  . Frequency of Communication with Friends and Family: More than three times a week  . Frequency of Social Gatherings with Friends and Family: More than three times a week  . Attends Religious Services: More than 4 times per year  . Active Member of Clubs or Organizations: No  . Attends Banker Meetings: More than 4 times per year  . Marital Status: Living with partner     Family History: The patient's ***family history includes Diabetes in his father and mother; Heart disease (age of onset: 78) in his father; Heart failure in his father; Hypertension in his mother.  ROS:   Please see the history of present illness.    *** All other systems reviewed and are negative.  EKGs/Labs/Other Studies Reviewed:    The following studies were reviewed today: TTE Oct 29, 2019: IMPRESSIONS    1. Left ventricular ejection fraction, by estimation, is 55 to 60%. The  left ventricle has normal function. The left ventricle has no regional  wall motion abnormalities. Left ventricular diastolic parameters were  normal.  2. Right ventricular systolic function is normal. The right ventricular  size is normal.  3. The mitral valve is normal in structure. Trivial mitral valve  regurgitation. No evidence of mitral stenosis.  4. The aortic valve is tricuspid. Aortic  valve regurgitation is not  visualized. No aortic stenosis is present.  5. The inferior vena cava is normal in size with greater than 50%  respiratory variability, suggesting right atrial pressure of 3 mmHg.   Comparison(s): Prior images unable to be directly viewed, comparison made  by report only.   Conclusion(s)/Recommendation(s): Normal biventricular function without  evidence of hemodynamically significant valvular heart disease.   Myoview 09/2019:  The left ventricular ejection fraction is mildly decreased (45-54%).  Nuclear stress EF: 47%.  There was no ST segment deviation noted during stress.  No T wave inversion was noted during stress.  The study is normal.  This is a low risk study.   Low risk stress nuclear study with normal perfusion and mildly reduced left ventricular global systolic function.   Holter monitor 11/2018  Normal sinus rhtyhm. Sinus tachycardia.  Rare PAC.  Average HR  90 bpm.  No pathologic arrhythmias.   Cardiac Catheterization: 05/2014 HEMODYNAMICS:Aortic pressure was 105/70; LV pressure was 105/2; LVEDP 16. There was no gradient between the left ventricle and aorta.   ANGIOGRAPHIC DATA:The left main coronary artery is widely patent.  The left anterior descending artery is a large vessel which wraps around the apex. There is mild atherosclerosis in the mid vessel. There are 2 large diagonals more proximally. There are widely patent. The third diagonal is medium-sized and patent.  The left circumflex artery is a large vessel. The first obtuse marginal is large and branches across the lateral wall. The second obtuse marginal is widely patent.  The right coronary artery is a large dominant vessel. There is an early bifurcation of the posterior lateral and posterior descending arteries. Both vessels are widely patent.  LEFT VENTRICULOGRAM:Left ventricular angiogram was done in the 30 RAO projection and revealed normal left  ventricular wall motion and systolic function with an estimated ejection fraction of 60 %. LVEDP was 16 mmHg.  IMPRESSIONS:  1. Normal left main coronary artery. 2. Mild disease in the left anterior descending artery and its branches. 3. Widely patent left circumflex artery and its branches. 4. Widely patent right coronary artery. 5. Normal left ventricular systolic function. LVEDP 16 mmHg. Ejection fraction 60%.      EKG:  EKG is *** ordered today.  The ekg ordered today demonstrates ***  Recent Labs: 07/05/2020: ALT 22; BUN 13; Creatinine, Ser 0.89; Hemoglobin 14.4; Platelets 269.0; Potassium 3.6; Sodium 139  Recent Lipid Panel    Component Value Date/Time   CHOL 83 (L) 09/20/2019 0746   TRIG 57 09/20/2019 0746   TRIG 90 06/25/2009 0000   HDL 32 (L) 09/20/2019 0746   CHOLHDL 2.6 09/20/2019 0746   CHOLHDL 3 08/29/2019 0847   VLDL 20.2 08/29/2019 0847   LDLCALC 37 09/20/2019 0746     Risk Assessment/Calculations:   {Does this patient have ATRIAL FIBRILLATION?:[phone]}   Physical Exam:    VS:  There were no vitals taken for this visit.    Wt Readings from Last 3 Encounters:  07/22/20 295 lb 12.8 oz (134.2 kg)  07/05/20 292 lb 9.6 oz (132.7 kg)  03/21/20 282 lb (127.9 kg)     GEN: *** Well nourished, well developed in no acute distress HEENT: Normal NECK: No JVD; No carotid bruits LYMPHATICS: No lymphadenopathy CARDIAC: ***RRR, no murmurs, rubs, gallops RESPIRATORY:  Clear to auscultation without rales, wheezing or rhonchi  ABDOMEN: Soft, non-tender, non-distended MUSCULOSKELETAL:  No edema; No deformity  SKIN: Warm and dry NEUROLOGIC:  Alert and oriented x 3 PSYCHIATRIC:  Normal affect   ASSESSMENT:    No diagnosis found. PLAN:    In order of problems listed above:  #Non-obstructive CAD: -Continue ASA 81mg  daily -Continue zetia 10mg  daily -Continue crestor 40mg  daily  #Sarcoid:  #HTN:  #HLD: -Continue zetia 10mg  daily -Continue  crestor 40mg  daily  {Are you ordering a CV Procedure (e.g. stress test, cath, DCCV, TEE, etc)?   Press F2        :[id number]}    Medication Adjustments/Labs and Tests Ordered: Current medicines are reviewed at length with the patient today.  Concerns regarding medicines are outlined above.  No orders of the defined types were placed in this encounter.  No orders of the defined types were placed in this encounter.   There are no Patient Instructions on file for this visit.   Signed, Xavier Sprague, MD  07/28/2020 12:45 PM  [facility]

## 2020-07-30 ENCOUNTER — Ambulatory Visit: Payer: Medicare Other | Admitting: Cardiology

## 2020-08-06 ENCOUNTER — Ambulatory Visit (INDEPENDENT_AMBULATORY_CARE_PROVIDER_SITE_OTHER): Payer: Medicare Other | Admitting: Pharmacist

## 2020-08-06 ENCOUNTER — Other Ambulatory Visit: Payer: Self-pay

## 2020-08-06 DIAGNOSIS — E1169 Type 2 diabetes mellitus with other specified complication: Secondary | ICD-10-CM

## 2020-08-06 DIAGNOSIS — I25118 Atherosclerotic heart disease of native coronary artery with other forms of angina pectoris: Secondary | ICD-10-CM

## 2020-08-06 DIAGNOSIS — E669 Obesity, unspecified: Secondary | ICD-10-CM

## 2020-08-06 DIAGNOSIS — I110 Hypertensive heart disease with heart failure: Secondary | ICD-10-CM | POA: Diagnosis not present

## 2020-08-06 DIAGNOSIS — I10 Essential (primary) hypertension: Secondary | ICD-10-CM | POA: Diagnosis not present

## 2020-08-06 DIAGNOSIS — E785 Hyperlipidemia, unspecified: Secondary | ICD-10-CM

## 2020-08-06 NOTE — Progress Notes (Signed)
Chronic Care Management Pharmacy Note  08/06/2020 Name:  [name] MRN:  [id number] DOB:  1970/06/23  Subjective: [name] is an 50 y.o. year old male who is a primary patient of Janith Lima, MD.  The CCM team was consulted for assistance with disease management and care coordination needs.    Engaged with patient by telephone for follow up visit in response to provider referral for pharmacy case management and/or care coordination services.   Consent to Services:  The patient was given information about Chronic Care Management services, agreed to services, and gave verbal consent prior to initiation of services.  Please see initial visit note for detailed documentation.   Patient Care Team: Janith Lima, MD as PCP - General (Internal Medicine) Dorothy Spark, MD as PCP - Cardiology (Cardiology) Kittie Plater as Consulting Physician (Rheumatology) Mosetta Anis, MD as Referring Physician (Rheumatology) Chesley Mires, MD as Consulting Physician (Pulmonary Disease) Renato Shin, MD as Consulting Physician (Endocrinology) Dorothy Spark, MD (Cardiology) Charlton Haws, [facility] as Pharmacist (Pharmacist) Webb Laws, [place] as Referring Physician (Optometry)  Recent office visits: 07/05/20 NP Jodi Mourning OV: LUQ pain. CT scan possible mild acute pancreatitis, rec'd MRI of pancreas. Enzymes normal. MRI results also normal - referred to GI.  03/21/20 Dr Ronnald Ramp OV: c/o urinary frequency, no evidence of infection  01/10/20 Dr Ronnald Ramp OV: ED f/u. A1c up to 7.5%, rec'd lifestyle modifications. IBS not improved with Bentyl. Try Trulance.  09/26/19 Dr Ronnald Ramp OV: EF < 50% rec'd ECHO. EF 55-60%, no major concerns.  08/29/19 Dr Ronnald Ramp OV: chronic f/u, conditions stable. Ordered myocardial perfusion scan which showed EF < 50% but no obstructive disease.  Recent consult visits: 02/11/20 ED visit: nausea/vomiting. Blood work, CT scan reassuring. Treated for UTI with  Keflex.  01/25/20 Dr Chase Caller (Pulmonary): sarcoid flare, re-establishing care (last 3 years ago). Flare may be sarcoid, obesity, diastolic dysfunction, or asthma. Ordered CBC, HRCT, PFT, and 3rd Covid booster.  01/09/20 ED visit: RLQ pain, ruled out gastroenteritis, appendicitis, treated symptomatically with morphine and zofran. Discharged with Bentyl.  06/20/19 PA Lenze (cardiology): No chest pain, encouraged exercise. Had palpitations in June 2020, holding off on restarting a BB due to controlled BP and no recent palpitations  Hospital visits: None in previous 6 months  Objective:  Lab Results  Component Value Date   CREATININE 0.89 07/05/2020   BUN 13 07/05/2020   GFR 102.00 07/05/2020   GFRNONAA >60 02/11/2020   GFRAA >60 02/11/2020   NA 139 07/05/2020   K 3.6 07/05/2020   CALCIUM 9.8 07/05/2020   CO2 29 07/05/2020    Lab Results  Component Value Date/Time   HGBA1C 7.5 (A) 01/10/2020 03:56 PM   HGBA1C 6.8 (H) 08/29/2019 08:47 AM   HGBA1C 6.4 (A) 04/24/2019 08:03 AM   HGBA1C 7.2 (H) 06/29/2017 09:23 AM   GFR 102.00 07/05/2020 09:17 AM   GFR 106.83 08/29/2019 08:47 AM   MICROALBUR 1.0 08/29/2019 08:47 AM   MICROALBUR <0.7 06/14/2018 03:56 PM    Last diabetic Eye exam:  Lab Results  Component Value Date/Time   HMDIABEYEEXA No Retinopathy 10/26/2019 12:00 AM    Last diabetic Foot exam:  Lab Results  Component Value Date/Time   HMDIABFOOTEX Done 04/10/2010 12:00 AM    MRI Pancreas 07/14/20: IMPRESSION: 1. No MR evidence of pancreatic mass or other acute findings in the abdomen. No pancreatic ductal dilatation. 2. Mild hepatic steatosis. 3. Status post cholecystectomy.  Lab Results  Component Value  Date   CHOL 83 (L) 09/20/2019   HDL 32 (L) 09/20/2019   LDLCALC 37 09/20/2019   TRIG 57 09/20/2019   CHOLHDL 2.6 09/20/2019    Hepatic Function Latest Ref Rng & Units 07/05/2020 01/09/2020 08/29/2019  Total Protein 6.0 - 8.3 g/dL 8.1 7.6 7.5  Albumin 3.5 - 5.2 g/dL  4.5 4.0 4.2  AST 0 - 37 U/L _0 ALT 0 - 53 U/L _1 Alk Phosphatase 39 - 117 U/L 89 77 84  Total Bilirubin 0.2 - 1.2 mg/dL 0.4 0.3 0.4  Bilirubin, Direct 0.0 - 0.3 mg/dL - - 0.1    Lab Results  Component Value Date/Time   TSH 0.62 06/14/2018 03:56 PM   TSH 0.66 03/01/2017 12:05 PM   FREET4 0.7 11/13/2008 11:12 AM    CBC Latest Ref Rng & Units 07/05/2020 02/11/2020 01/09/2020  WBC 4.0 - 10.5 K/uL 6.1 8.5 6.5  Hemoglobin 13.0 - 17.0 g/dL 14.4 15.2 14.3  Hematocrit 39.0 - 52.0 % 42.7 45.0 41.7  Platelets 150.0 - 400.0 K/uL 269.0 249 241    No results found for: VD25OH  Clinical ASCVD: No  The ASCVD Risk score Mikey Bussing DC Jr., et al., 2013) failed to calculate for the following reasons:   The valid total cholesterol range is 130 to 320 mg/dL    Depression screen [facility] 2/9 07/22/2020 08/29/2019 06/14/2018  Decreased Interest 0 0 0  Down, Depressed, Hopeless 0 0 0  PHQ - 2 Score 0 0 0  Altered sleeping - 1 -  Tired, decreased energy - 0 -  Change in appetite - 0 -  Feeling bad or failure about yourself  - 0 -  Trouble concentrating - 0 -  Moving slowly or fidgety/restless - 0 -  Suicidal thoughts - 0 -  PHQ-9 Score - 1 -  Difficult doing work/chores - Not difficult at all -  Some recent data might be hidden     Social History   Tobacco Use  Smoking Status Former Smoker  . Packs/day: 1.00  . Years: 10.00  . Pack years: 10.00  . Types: Cigarettes  . Quit date: 05/18/2009  . Years since quitting: 11.2  Smokeless Tobacco Never Used   BP Readings from Last 3 Encounters:  07/22/20 128/80  07/05/20 130/72  03/21/20 136/82   Pulse Readings from Last 3 Encounters:  07/22/20 88  07/05/20 89  03/21/20 92   Wt Readings from Last 3 Encounters:  07/22/20 295 lb 12.8 oz (134.2 kg)  07/05/20 292 lb 9.6 oz (132.7 kg)  03/21/20 282 lb (127.9 kg)    Assessment/Interventions: Review of patient past medical history, allergies, medications, health status, including review of  consultants reports, laboratory and other test data, was performed as part of comprehensive evaluation and provision of chronic care management services.   SDOH:  (Social Determinants of Health) assessments and interventions performed: Yes   CCM Care Plan  Allergies  Allergen Reactions  . Amlodipine Other (See Comments)    Ankle swelling with 1m, ok with 531mdose  . Sulfamethoxazole Rash    fever  . Sulfonamide Derivatives Rash and Other (See Comments)    Medications Reviewed Today    Reviewed by FoCharlton HawsRPBaptist Memorial Rehabilitation HospitalPharmacist) on 08/06/20 at 1213  Med List Status: <None>  Medication Order Taking? Sig Documenting Provider Last Dose Status Informant  albuterol (VENTOLIN HFA) 108 (90 Base) MCG/ACT inhaler [id number] Inhale 2 puffs into the lungs every 6 (six) hours as  needed for wheezing or shortness of breath. Janith Lima, MD Taking Active Self  allopurinol (ZYLOPRIM) 300 MG tablet [id number] Yes TAKE 1 TABLET(300 MG) BY MOUTH DAILY Janith Lima, MD Taking Active   aspirin EC 81 MG tablet [id number] Yes Take 1 tablet (81 mg total) by mouth daily. Rowe Clack, MD Taking Active Self  cholecalciferol (VITAMIN D) 1000 units tablet [id number] Yes Take 1,000 Units by mouth daily. [provider] Taking Active Self  clomiPHENE (CLOMID) 50 MG tablet [id number] Yes SMARTSIG:0.5 Tablet(s) By Mouth Every Other Day [provider] Taking Active   colchicine 0.6 MG tablet [id number] Yes Take 0.6 mg by mouth every 4 (four) hours as needed (gout pain). [provider] Taking Active Self           Med Note Charlton Haws   Tue Aug 29, 2019 11:32 AM)    ezetimibe (ZETIA) 10 MG tablet [id number] Yes Take 1 tablet (10 mg total) by mouth daily. Jettie Booze, MD Taking Active   fluticasone [facility]) 50 MCG/ACT nasal spray [id number] Yes Place 2 sprays into both nostrils daily. Marrian Salvage, FNP Taking Active   folic acid (FOLVITE) 1 MG  tablet [id number] Yes TK 1 T PO QD [provider] Taking Active Self           Med Note Sandford Craze   Mon Jun 28, 2017  8:49 AM)    glucose blood [facility] VERIO) test strip [id number] Yes 1 each by Other route daily. And lancets 1/day  Patient taking differently: 1 each by Other route daily.   Renato Shin, MD Taking Active   metoprolol succinate (TOPROL-XL) 50 MG 24 hr tablet [id number] Yes Take 50 mg by mouth daily. Take with or immediately following a meal. [provider] Taking Active   nitroGLYCERIN (NITROSTAT) 0.4 MG SL tablet [id number] Yes Place 1 tablet (0.4 mg total) under the tongue every 5 (five) minutes as needed for chest pain. Lyda Jester M, PA-C Taking Active Self  omeprazole (PRILOSEC) 40 MG capsule [id number] Yes TAKE 1 CAPSULE BY MOUTH TWICE DAILY Janith Lima, MD Taking Active   pantoprazole (PROTONIX) 40 MG tablet [id number] Yes TAKE 1 TABLET(40 MG) BY MOUTH TWICE DAILY Marrian Salvage, FNP Taking Active   Potassium Chloride ER 20 MEQ TBCR [id number] Yes TAKE 1 TABLET(20 MEQ) BY MOUTH TWICE DAILY Janith Lima, MD Taking Active   repaglinide (PRANDIN) 0.5 MG tablet [id number] Yes Take 1 tablet (0.5 mg total) by mouth 3 (three) times daily before meals. Renato Shin, MD Taking Active   rosuvastatin (CRESTOR) 40 MG tablet [id number] Yes TAKE 1 TABLET(40 MG) BY MOUTH DAILY Janith Lima, MD Taking Active   sacubitril-valsartan [facility]) 24-26 MG [id number] Yes Take 1 tablet by mouth 2 (two) times daily. [provider] Taking Active   saw palmetto 500 MG capsule [id number] Yes Take 500 mg by mouth daily. [provider] Taking Active   SYNJARDY XR 12.10-998 MG TB24 [id number] Yes TAKE 2 TABLETS BY MOUTH DAILY Janith Lima, MD Taking Active   tadalafil (CIALIS) 20 MG tablet [id number] Yes TAKE ONE TABLET BY MOUTH DAILY AS NEEDED McKenzie, Candee Furbish, MD Taking Active           Patient Active Problem List   Diagnosis Date  Noted  . Urinary frequency 03/21/2020  . Irritable bowel syndrome with constipation 01/10/2020  . Ejection fraction < 50% 09/26/2019  . LVH (left ventricular hypertrophy) due to hypertensive  disease, with heart failure ([place]) 09/26/2019  . Routine general medical examination at a health care facility 06/14/2018  . Hepatic steatosis 11/03/2017  . Primary narcolepsy without cataplexy 06/22/2017  . Periodic limb movements of sleep 06/22/2017  . Hypersomnia 06/22/2017  . Nonintractable epilepsy with complex partial seizures ([place]) 03/23/2017  . Drug-induced erectile dysfunction 11/06/2015  . Body mass index (BMI) of 35.0 to 35.9 with comorbidity   . Sarcoidosis ([place]) 09/29/2010  . GERD 01/07/2010  . Exercise-induced asthma 12/03/2009  . POLYNEUROPATHY 10/17/2009  . Male hypogonadism 10/08/2009  . Diabetes mellitus type 2 in obese ([place]) 10/07/2009  . Hyperlipidemia with target LDL less than 70 10/07/2009  . Gout 10/07/2009  . Essential hypertension 10/07/2009  . CAD (coronary artery disease) 10/07/2009  . Male erectile dysfunction 10/07/2009    Immunization History  Administered Date(s) Administered  . Influenza Split 04/06/2011, 02/17/2012, 03/16/2012  . Influenza,inj,Quad PF,6+ Mos 03/21/2013, 03/08/2014, 04/03/2015, 02/19/2016, 02/17/2017, 06/14/2018, 02/20/2019, 03/21/2020  . Moderna Sars-Covid-2 Vaccination 07/31/2019, 09/05/2019, 04/19/2020  . Pneumococcal Conjugate-13 04/29/2015  . Pneumococcal Polysaccharide-23 11/06/2015  . Tdap 03/21/2013    Conditions to be addressed/monitored:  Hypertension, Hyperlipidemia, Diabetes, Heart Failure and Coronary Artery Disease  Care Plan : [place]  Updates made by Charlton Haws, [place] since 08/06/2020 12:00 AM    Problem: Hypertension, Hyperlipidemia, Diabetes, Heart Failure and Coronary Artery Disease   Priority: High    Long-Range Goal: Disease management   Start Date: 08/06/2020  Expected End Date: 02/06/2021  This  Visit's Progress: On track  Priority: High  Note:   Current Barriers:  . Unable to independently monitor therapeutic efficacy . Unable to maintain control of blood pressure  Pharmacist Clinical Goal(s):  Marland Kitchen Over the next 90 days, patient will achieve adherence to monitoring guidelines and medication adherence to achieve therapeutic efficacy . maintain control of BP as evidenced by improved home readings  through collaboration with PharmD and provider.   Interventions: . 1:1 collaboration with Janith Lima, MD regarding development and update of comprehensive plan of care as evidenced by provider attestation and co-signature . Inter-disciplinary care team collaboration (see longitudinal plan of care) . Comprehensive medication review performed; medication list updated in electronic medical record  Hypertension / Heart failure (BP goal < 130/80) Not ideally ontrolled  - home BP 143/90 Pt reports BP meds were changed 2 weeks ago per cardiologist (outside [facility]) - stopped irbesartan/HCTZ and torsemide Current regimen:  ? Metoprolol succinate 50 mg daily ? Entresto 24-26 mg BID Interventions: ? Discussed BP goals and benefits of medications for prevention of heart attack / stroke ? Patient has follow up with cardiologist in 2 weeks, will defer medication changes ? Recommend to continue current medications   Hyperlipidemia / CAD (LDL goal < 70)   CAD: 09/2019 nuclear stress test low risk study. Controlled  - LDL 37 Current regimen:  ? rosuvastatin 40 mg daily,  ? ezetimibe 10 mg daily,  ? nitroglycerin 0.4 mg as needed ? aspirin 81 mg daily    Interventions: ? Discussed cholesterol goals and benefits of medications for prevention of heart attack / stroke ? Recommend to continue current medication   Diabetes (A1c goal < 7%) Not ideally controlled - last A1c 7.5% from > 6 months ago. -Previously managed per Dr Loanne Drilling but pt has not seen since 04/2019. He voices plans to schedule f/u  in next few months. -Pioglitazone was dc'd 2 weeks ago by cardiologist due to weight gain/edema -Fasting BG 130, Post-prandial  BG 160 Current regimen:  ? Synjardy XR 12.10-998 mg - 2 tablets daily  ? repaglinide 0.5 mg TID with meals - not filled Past tried/failed meds: Invokana, Farxiga, [name], pioglitazone Interventions: ? Discussed blood sugar goals and benefits of medications for prevention of diabetic complications ? Discussed history of pancreatitis (09/2017) limits use of other classes of diabetes medications (GLP-1 and DPP-IV) ? Discussed ways to improve diet - switch to diet sodas/juices ? Recommend to continue current medication  Patient Goals/Self-Care Activities . Over the next 90 days, patient will:  - take medications as prescribed check glucose daily, document, and provide at future appointments check blood pressure daily, document, and provide at future appointments weigh daily, and contact provider if weight gain of 2+ lbs overnight or 5+ lbs in a week  Follow Up Plan: Telephone follow up appointment with care management team member scheduled for:3 months      Medication Assistance: None required.  Patient affirms current coverage meets needs. Did discuss patient assistance is available for [place] and Delene Loll if needed.  Patient's preferred pharmacy is:  [id number] [address], [place] [address] Phone: [phone] Fax: [phone]  [facility] [address] [place] [address] Phone: [phone] Fax: [phone]  Uses pill box? Yes Pt endorses 100% compliance  We discussed: Current pharmacy is preferred with insurance plan and patient is satisfied with pharmacy services Patient decided to: Continue current medication management strategy  Care Plan and Follow Up Patient Decision:   Patient agrees to Care Plan and Follow-up.  Plan: Telephone follow up appointment with care management team member scheduled for:  3 months  Charlene Brooke, PharmD, [facility] Clinical Pharmacist [place] [facility] at [facility] [phone]

## 2020-08-06 NOTE — Patient Instructions (Signed)
Visit Information  Phone number for Pharmacist: [phone]  Goals Addressed   None    Patient Care Plan: CCM Pharmacy Care Plan    Problem Identified: Hypertension, Hyperlipidemia, Diabetes, Heart Failure and Coronary Artery Disease   Priority: High    Long-Range Goal: Disease management   Start Date: 08/06/2020  Expected End Date: 02/06/2021  This Visit's Progress: On track  Priority: High  Note:   Current Barriers:  . Unable to independently monitor therapeutic efficacy . Unable to maintain control of blood pressure  Pharmacist Clinical Goal(s):  Marland Kitchen Over the next 90 days, patient will achieve adherence to monitoring guidelines and medication adherence to achieve therapeutic efficacy . maintain control of BP as evidenced by improved home readings  through collaboration with PharmD and provider.   Interventions: . 1:1 collaboration with [name], MD regarding development and update of comprehensive plan of care as evidenced by provider attestation and co-signature . Inter-disciplinary care team collaboration (see longitudinal plan of care) . Comprehensive medication review performed; medication list updated in electronic medical record  Hypertension / Heart failure (BP goal < 130/80) Not ideally ontrolled  - home BP 143/90 Pt reports BP meds were changed 2 weeks ago per cardiologist (outside [facility]) - stopped irbesartan/HCTZ and torsemide Current regimen:  ? Metoprolol succinate 50 mg daily ? Entresto 24-26 mg BID Interventions: ? Discussed BP goals and benefits of medications for prevention of heart attack / stroke ? Patient has follow up with cardiologist in 2 weeks, will defer medication changes ? Recommend to continue current medications   Hyperlipidemia / CAD (LDL goal < 70)   CAD: 09/2019 nuclear stress test low risk study. Controlled  - LDL 37 Current regimen:  ? rosuvastatin 40 mg daily,  ? ezetimibe 10 mg daily,  ? nitroglycerin 0.4 mg as needed ? aspirin  81 mg daily    Interventions: ? Discussed cholesterol goals and benefits of medications for prevention of heart attack / stroke ? Recommend to continue current medication   Diabetes (A1c goal < 7%) Not ideally controlled - last A1c 7.5% from > 6 months ago. -Previously managed per Dr Everardo All but pt has not seen since 04/2019. He voices plans to schedule f/u in next few months. -Pioglitazone was dc'd 2 weeks ago by cardiologist due to weight gain/edema -Fasting BG 130, Post-prandial BG 160 Current regimen:  ? Synjardy XR 12.10-998 mg - 2 tablets daily  ? repaglinide 0.5 mg TID with meals - not filled Past tried/failed meds: Invokana, Farxiga, [name], pioglitazone Interventions: ? Discussed blood sugar goals and benefits of medications for prevention of diabetic complications ? Discussed history of pancreatitis (09/2017) limits use of other classes of diabetes medications (GLP-1 and DPP-IV) ? Discussed ways to improve diet - switch to diet sodas/juices ? Recommend to continue current medication  Patient Goals/Self-Care Activities . Over the next 90 days, patient will:  - take medications as prescribed check glucose daily, document, and provide at future appointments check blood pressure daily, document, and provide at future appointments weigh daily, and contact provider if weight gain of 2+ lbs overnight or 5+ lbs in a week  Follow Up Plan: Telephone follow up appointment with care management team member scheduled for:3 months      The patient verbalized understanding of instructions, educational materials, and care plan provided today and declined offer to receive copy of patient instructions, educational materials, and care plan.  Telephone follow up appointment with pharmacy team member scheduled for: 3 months  Al Corpus,  PharmD, [place] Clinical Pharmacist [place] [facility] at [facility] [phone]

## 2020-08-21 DIAGNOSIS — I25118 Atherosclerotic heart disease of native coronary artery with other forms of angina pectoris: Secondary | ICD-10-CM | POA: Diagnosis not present

## 2020-08-21 DIAGNOSIS — E119 Type 2 diabetes mellitus without complications: Secondary | ICD-10-CM | POA: Diagnosis not present

## 2020-08-21 DIAGNOSIS — I10 Essential (primary) hypertension: Secondary | ICD-10-CM | POA: Diagnosis not present

## 2020-08-21 DIAGNOSIS — M109 Gout, unspecified: Secondary | ICD-10-CM | POA: Diagnosis not present

## 2020-08-26 ENCOUNTER — Other Ambulatory Visit: Payer: Self-pay | Admitting: Cardiology

## 2020-08-26 ENCOUNTER — Other Ambulatory Visit: Payer: Self-pay | Admitting: Interventional Cardiology

## 2020-08-28 ENCOUNTER — Telehealth: Payer: Medicare Other

## 2020-09-03 ENCOUNTER — Encounter: Payer: Self-pay | Admitting: Gastroenterology

## 2020-09-03 ENCOUNTER — Ambulatory Visit: Payer: Medicare Other | Admitting: Gastroenterology

## 2020-09-03 VITALS — BP 148/72 | HR 79 | Ht 75.0 in | Wt 280.4 lb

## 2020-09-03 DIAGNOSIS — Z1211 Encounter for screening for malignant neoplasm of colon: Secondary | ICD-10-CM | POA: Diagnosis not present

## 2020-09-03 DIAGNOSIS — Z1212 Encounter for screening for malignant neoplasm of rectum: Secondary | ICD-10-CM | POA: Diagnosis not present

## 2020-09-03 DIAGNOSIS — R198 Other specified symptoms and signs involving the digestive system and abdomen: Secondary | ICD-10-CM | POA: Diagnosis not present

## 2020-09-03 DIAGNOSIS — R1012 Left upper quadrant pain: Secondary | ICD-10-CM | POA: Diagnosis not present

## 2020-09-03 MED ORDER — NA SULFATE-K SULFATE-MG SULF 17.5-3.13-1.6 GM/177ML PO SOLN: 1.0000 | Freq: Once | ORAL | 0 refills | Status: AC

## 2020-09-03 NOTE — Patient Instructions (Signed)
You have been scheduled for an endoscopy and colonoscopy. Please follow the written instructions given to you at your visit today. Please pick up your prep supplies at the pharmacy within the next 1-3 days. If you use inhalers (even only as needed), please bring them with you on the day of your procedure.  You can take over the counter Miralax mixing 17 grams in 8 oz of water daily for constipation.   Thank you for choosing me and [facility].  Venita Lick. Pleas Koch., MD., Clementeen Graham

## 2020-09-03 NOTE — Progress Notes (Signed)
    History of Present Illness: This is a 50 year old male with LUQ pain, alternating diarrhea and constipation.  He relates 2 months of left upper quadrant pain that radiates to his back with occasional nausea, dyspepsia, reflux symptoms.  In addition he has difficulties with alternating diarrhea and constipation however constipation is the predominant problem.  Takes a stool softener daily which helps but has not resolved the constipation.  He was started on pantoprazole 40 mg daily and his symptoms have almost resolved.  Imaging studies as below. CBC, CMP, amylase, lipase were unremarkable in January except for an elevated glucose and a slightly decreased MCV. Denies weight loss,  change in stool caliber, melena, hematochezia, vomiting, dysphagia, chest pain.   CT AP 07/08/2020 Possible mild acute pancreatitis versus underlying pancreatic head/neck lesion. Correlate with lipase levels. Given continued findings as far back as 2019, recommend MRI pancreatic protocol for further evaluation/exclusion of an underlying malignancy.  MR abd 07/14/2020 1. No MR evidence of pancreatic mass or other acute findings in the abdomen. No pancreatic ductal dilatation. 2. Mild hepatic steatosis. 3. Status post cholecystectomy.  Current Medications, Allergies, Past Medical History, Past Surgical History, Family History and Social History were reviewed in [employer] record.   Physical Exam: General: Well developed, well nourished, no acute distress Head: Normocephalic and atraumatic Eyes: Sclerae anicteric, EOMI Ears: Normal auditory acuity Mouth: Not examined, mask on during Covid-19 pandemic Lungs: Clear throughout to auscultation Heart: Regular rate and rhythm; no murmurs, rubs or bruits Abdomen: Soft, non tender and non distended. No masses, hepatosplenomegaly or hernias noted. Normal Bowel sounds Rectal: Not done Musculoskeletal: Symmetrical with no gross deformities  Pulses:   Normal pulses noted Extremities: No clubbing, cyanosis, edema or deformities noted Neurological: Alert oriented x 4, grossly nonfocal Psychological:  Alert and cooperative. Normal mood and affect   Assessment and Recommendations:  1. LUQ pain, GERD, nausea, dyspepsia has substantially improved on pantoprazole.  He has been avoiding red meat and other foods that exacerbate symptoms.  He is advised to closely follow antireflux measures and to GI continue pantoprazole 40 mg daily.  Rule out ulcer, gastritis, esophagitis.  Schedule EGD. The risks (including bleeding, perforation, infection, missed lesions, medication reactions and possible hospitalization or surgery if complications occur), benefits, and alternatives to endoscopy with possible biopsy and possible dilation were discussed with the patient and they consent to proceed.   2. Alternative constipation and diarrhea however constipation is his predominant symptom.  Begin MiraLAX daily along with daily Colace.  Titrate MiraLAX dose between one half scoop and 2 scoops daily for a complete bowel movement daily.  3.  Hepatic steatosis.  Long-term carb modified, fat modified, weight loss diet supervised by his PCP.  Optimal control of blood sugar and hyperlipidemia.  4. Colorectal screening, average risk.  Schedule colonoscopy. The risks (including bleeding, perforation, infection, missed lesions, medication reactions and possible hospitalization or surgery if complications occur), benefits, and alternatives to colonoscopy with possible biopsy and possible polypectomy were discussed with the patient and they consent to proceed.

## 2020-09-11 ENCOUNTER — Telehealth: Payer: Self-pay | Admitting: Pharmacist

## 2020-09-11 NOTE — Progress Notes (Addendum)
Chronic Care Management Pharmacy Assistant   Name: [name]  MRN: [id number] DOB: [birth year]   Reason for Encounter: Hypertension Disease State Call   Conditions to be addressed/monitored: HTN   Recent office visits:  None ID  Recent consult visits:  09/03/20 Patient last seen Dr Mallie Snooks, started Na Sulfate-k Sulfate 17.5-3.13-1.6 gm/133ml [facility] visits:  None in previous 6 months  Medications: Outpatient Encounter Medications as of 09/11/2020  Medication Sig   albuterol (VENTOLIN HFA) 108 (90 Base) MCG/ACT inhaler Inhale 2 puffs into the lungs every 6 (six) hours as needed for wheezing or shortness of breath.   allopurinol (ZYLOPRIM) 300 MG tablet TAKE 1 TABLET(300 MG) BY MOUTH DAILY   aspirin EC 81 MG tablet Take 1 tablet (81 mg total) by mouth daily.   cholecalciferol (VITAMIN D) 1000 units tablet Take 1,000 Units by mouth daily.   clomiPHENE (CLOMID) 50 MG tablet Take 50 mg by mouth every other day.   colchicine 0.6 MG tablet Take 0.6 mg by mouth every 4 (four) hours as needed (gout pain).   docusate sodium (COLACE) 100 MG capsule Take 100 mg by mouth daily.   ezetimibe (ZETIA) 10 MG tablet TAKE 1 TABLET(10 MG) BY MOUTH DAILY   fluticasone (FLONASE) 50 MCG/ACT nasal spray Place 2 sprays into both nostrils daily.   folic acid (FOLVITE) 1 MG tablet Take 1 mg by mouth daily.   glucose blood (ONETOUCH VERIO) test strip 1 each by Other route daily. And lancets 1/day (Patient taking differently: 1 each by Other route daily.)   metoprolol succinate (TOPROL-XL) 50 MG 24 hr tablet Take 50 mg by mouth daily. Take with or immediately following a meal.   nitroGLYCERIN (NITROSTAT) 0.4 MG SL tablet Place 1 tablet (0.4 mg total) under the tongue every 5 (five) minutes as needed for chest pain.   pantoprazole (PROTONIX) 40 MG tablet TAKE 1 TABLET(40 MG) BY MOUTH TWICE DAILY   rosuvastatin (CRESTOR) 40 MG tablet TAKE 1 TABLET(40 MG) BY MOUTH DAILY    sacubitril-valsartan (ENTRESTO) 24-26 MG Take 1 tablet by mouth 2 (two) times daily.   saw palmetto 500 MG capsule Take 500 mg by mouth daily.   SYNJARDY XR 12.10-998 MG TB24 TAKE 2 TABLETS BY MOUTH DAILY   tadalafil (CIALIS) 20 MG tablet TAKE ONE TABLET BY MOUTH DAILY AS NEEDED   Zinc Sulfate (ZINC 15 PO) Take 1 tablet by mouth daily.   No facility-administered encounter medications on file as of 09/11/2020.    Reviewed chart prior to disease state call. Spoke with patient regarding BP  Recent Office Vitals: BP Readings from Last 3 Encounters:  09/03/20 (!) 148/72  07/22/20 128/80  07/05/20 130/72   Pulse Readings from Last 3 Encounters:  09/03/20 79  07/22/20 88  07/05/20 89    Wt Readings from Last 3 Encounters:  09/03/20 280 lb 6.4 oz (127.2 kg)  07/22/20 295 lb 12.8 oz (134.2 kg)  07/05/20 292 lb 9.6 oz (132.7 kg)     Kidney Function Lab Results  Component Value Date/Time   CREATININE 0.89 07/05/2020 09:17 AM   CREATININE 0.68 02/11/2020 07:54 PM   CREATININE 0.91 04/07/2016 03:29 PM   GFR 102.00 07/05/2020 09:17 AM   GFRNONAA >60 02/11/2020 07:54 PM   GFRAA >60 02/11/2020 07:54 PM    BMP Latest Ref Rng & Units 07/05/2020 02/11/2020 01/09/2020  Glucose 70 - 99 mg/dL 157(H) 160(H) 131(H)  BUN 6 - 23 mg/dL 13 19 13  Creatinine 0.40 - 1.50 mg/dL 0.89 0.68 0.75  BUN/Creat Ratio 9 - 20 - - -  Sodium 135 - 145 mEq/L 139 136 136  Potassium 3.5 - 5.1 mEq/L 3.6 4.2 3.8  Chloride 96 - 112 mEq/L 101 101 97(L)  CO2 19 - 32 mEq/L $Remove'29 23 28  'nEcRusp$ Calcium 8.4 - 10.5 mg/dL 9.8 8.6(L) 9.2    Current antihypertensive regimen:        Metoprolol succinate 50 mg daily        Sacubitril-valsartan (Entresto) 24-26 mg 1 tab BID  How often are you checking your Blood Pressure?       The patient states that he takes blood pressure twice daily  Current home BP readings:         The patient stated that his blood pressure today was 117/78 this morning  What recent interventions/DTPs have been  made by any provider to improve Blood Pressure control since last CPP Visit:         The patient is to keep a log of blood pressures but states that he really has not started to write them down. He also is to continue current medications.  Any recent hospitalizations or ED visits since last visit with CPP?         The patient has not had any hospital or ED visits  What diet changes have been made to improve Blood Pressure Control?  The patient states that he has not made any changes to his diet  What exercise is being done to improve your Blood Pressure Control?  The patient states that he has not made any changes to exercise  Adherence Review: Is the patient currently on ACE/ARB medication? Yes, Entresto Does the patient have >5 day gap between last estimated fill dates? No    Star Rating Drugs: Rosuvastatin 40 mg 1 tab daily;last filled 07/02/20 90 ds Sacubitril-valsartan (Entresto) 24-26 mg 1 tab BID; last filled 07/26/20 90 ds  [place] Pharmacist Assistant [phone]

## 2020-09-13 ENCOUNTER — Other Ambulatory Visit: Payer: Self-pay | Admitting: Internal Medicine

## 2020-09-13 DIAGNOSIS — K2100 Gastro-esophageal reflux disease with esophagitis, without bleeding: Secondary | ICD-10-CM

## 2020-09-13 MED ORDER — PANTOPRAZOLE SODIUM 40 MG PO TBEC: 80.0000 mg | DELAYED_RELEASE_TABLET | Freq: Every day | ORAL | 0 refills | Status: DC

## 2020-09-30 ENCOUNTER — Other Ambulatory Visit: Payer: Self-pay

## 2020-09-30 ENCOUNTER — Ambulatory Visit: Payer: Medicare Other | Admitting: Endocrinology

## 2020-09-30 VITALS — BP 130/84 | HR 81 | Ht 75.0 in | Wt 281.8 lb

## 2020-09-30 DIAGNOSIS — E1169 Type 2 diabetes mellitus with other specified complication: Secondary | ICD-10-CM

## 2020-09-30 DIAGNOSIS — E669 Obesity, unspecified: Secondary | ICD-10-CM | POA: Diagnosis not present

## 2020-09-30 LAB — POCT GLYCOSYLATED HEMOGLOBIN (HGB A1C): Hemoglobin A1C: 7.4 % — AB (ref 4.0–5.6)

## 2020-09-30 MED ORDER — NATEGLINIDE 120 MG PO TABS: 120.0000 mg | ORAL_TABLET | Freq: Three times a day (TID) | ORAL | 3 refills | Status: DC

## 2020-09-30 MED ORDER — SYNJARDY XR 12.5-1000 MG PO TB24
2.0000 | ORAL_TABLET | Freq: Every day | ORAL | 3 refills | Status: DC
Start: 2020-09-30 — End: 2022-03-02

## 2020-09-30 NOTE — Patient Instructions (Addendum)
I have sent a prescription to your pharmacy, to replace repaglinide with nateglinide.   Please continue the same Synjardy for now.   check your blood sugar once a day.  vary the time of day when you check, between before the 3 meals, and at bedtime.  also check if you have symptoms of your blood sugar being too high or too low.  please keep a record of the readings and bring it to your next appointment here (or you can bring the meter itself).  You can write it on any piece of paper.  please call us sooner if your blood sugar goes below 70, or if you have a lot of readings over 200.   Please come back for a follow-up appointment in 2 months.

## 2020-09-30 NOTE — Progress Notes (Signed)
Subjective:    Patient ID: [name], male    DOB: 1971/06/04, 50 y.o.   MRN: [id number]  HPI Pt returns for f/u of diabetes mellitus:  DM type: 2 Dx'ed: 2008 Complications: CAD Therapy: 4 oral meds.   DKA: never Severe hypoglycemia: never.  Pancreatitis: 2019 (GB) Pancreatic imaging: 2011 CT: mild fatty replacement in the head and uncinate process.   Other: he declines name brand meds, due to cost; he takes Synjardy samples.   Interval history: he takes meds as rx'ed.  No recent steroids.  He says cbg's vary from 94-195. It is in general higher as the day goes on.  He stopped pioglitazone, due to weight gain.  He says Synjardy causes nausea and diarrhea.  He stopped repaglinide, with some improvement in GI sxs.  no cbg record, but states cbg's vary from 138-178.   Past Medical History:  Diagnosis Date  . Anginal pain (HCC)   . Arthritis    ? 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 Followed by Dr. Nickola Major  . Arthritis    "ankles" (04/13/2016)  . Asthma   . CORONARY ARTERY DISEASE    a. cath 05/2014: mild LAD disease, normal LCx and RCA. Risk factor modification recommended.  Marland Kitchen DIABETES MELLITUS, TYPE II   . Edema   . ERECTILE DYSFUNCTION, ORGANIC   . GERD   . Gout, unspecified   . Heart murmur   . HYPERLIPIDEMIA   . HYPERTENSION   . 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 Pericarditis    recurrent  . POLYNEUROPATHY   . PULMONARY SARCOIDOSIS    Mediastinal lymphadenopathy with biospy proven sarcodosis  . Seizures (HCC)    "none in 4-5 years; don't know what kind; not related to alcohol" (04/13/2016)  . TESTICULAR HYPOFUNCTION   . TIA (transient ischemic attack)    "I don't remember when" (04/13/2016)    Past Surgical History:  Procedure Laterality Date  . BRONCHOSCOPY  08/21/08  . CARDIAC CATHETERIZATION  06/25/2009   minimal disease  . CARDIAC CATHETERIZATION N/A 04/14/2016   Procedure: Left Heart Cath and Coronary  Angiography;  Surgeon: Kathleene Hazel, MD;  Location: [facility] INVASIVE CV LAB;  Service: Cardiovascular;  Laterality: N/A;  . CHOLECYSTECTOMY N/A 09/21/2017   Procedure: LAPAROSCOPIC CHOLECYSTECTOMY WITH INTRAOPERATIVE CHOLANGIOGRAM;  Surgeon: Glenna Fellows, MD;  Location: [facility] ORS;  Service: General;  Laterality: N/A;  . LEFT HEART CATHETERIZATION WITH CORONARY ANGIOGRAM N/A 05/18/2014   Procedure: LEFT HEART CATHETERIZATION WITH CORONARY ANGIOGRAM;  Surgeon: Corky Crafts, MD;  Location: [facility] CATH LAB;  Service: Cardiovascular;  Laterality: N/A;  . MEDIASTINOSCOPY  11/30/08    Social History   Socioeconomic History  . Marital status: Significant Other    Spouse name: Not on file  . Number of children: 3  . Years of education: Not on file  . Highest education level: Not on file  Occupational History  . Occupation: Unemployed  Tobacco Use  . Smoking status: Former Smoker    Packs/day: 1.00    Years: 10.00    Pack years: 10.00    Types: Cigarettes    Quit date: 05/18/2009    Years since quitting: 11.3  . Smokeless tobacco: Never Used  Vaping Use  . Vaping Use: Never used  Substance and Sexual Activity  . Alcohol use: Yes    Comment: rare  . Drug use: No  . Sexual activity: Yes  Other Topics Concern  . Not on file  Social History Narrative   Married, lives with wife and 3 kids.    Currently student. 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 Difficulty of Paying Living Expenses: Not hard at all  Food Insecurity: No Food Insecurity  . Worried About Programme researcher, broadcasting/film/video in the Last Year: Never true  . Ran Out of Food in the Last Year: Never true  Transportation Needs: No Transportation Needs  . Lack of Transportation (Medical): No  . Lack of Transportation (Non-Medical): No  Physical Activity: Inactive  . Days of Exercise per Week: 0 days  . Minutes of Exercise per Session: 0 min   Stress: No Stress Concern Present  . Feeling of Stress : Not at all  Social Connections: Socially Integrated  . Frequency of Communication with Friends and Family: More than three times a week  . Frequency of Social Gatherings with Friends and Family: More than three times a week  . Attends Religious Services: More than 4 times per year  . Active Member of Clubs or Organizations: No  . Attends Banker Meetings: More than 4 times per year  . Marital Status: Living with partner  Intimate Partner Violence: Not on file    Current Outpatient Medications on File Prior to Visit  Medication Sig Dispense Refill  . albuterol (VENTOLIN HFA) 108 (90 Base) MCG/ACT inhaler Inhale 2 puffs into the lungs every 6 (six) hours as needed for wheezing or shortness of breath. 3 Inhaler 1  . allopurinol (ZYLOPRIM) 300 MG tablet TAKE 1 TABLET(300 MG) BY MOUTH DAILY 90 tablet 0  . aspirin EC 81 MG tablet Take 1 tablet (81 mg total) by mouth daily. 30 tablet 3  . cholecalciferol (VITAMIN D) 1000 units tablet Take 1,000 Units by mouth daily.    . clomiPHENE (CLOMID) 50 MG tablet Take 50 mg by mouth every other day.    . colchicine 0.6 MG tablet Take 0.6 mg by mouth every 4 (four) hours as needed (gout pain).    Marland Kitchen docusate sodium (COLACE) 100 MG capsule Take 100 mg by mouth daily.    Marland Kitchen ezetimibe (ZETIA) 10 MG tablet TAKE 1 TABLET(10 MG) BY MOUTH DAILY 30 tablet 0  . fluticasone (FLONASE) 50 MCG/ACT nasal spray Place 2 sprays into both nostrils daily. 16 g 6  . folic acid (FOLVITE) 1 MG tablet Take 1 mg by mouth daily.  1  . glucose blood (ONETOUCH VERIO) test strip 1 each by Other route daily. And lancets 1/day (Patient taking differently: 1 each by Other route daily.) 100 each 11  . metoprolol succinate (TOPROL-XL) 50 MG 24 hr tablet Take 50 mg by mouth daily. Take with or immediately following a meal.    . nitroGLYCERIN (NITROSTAT) 0.4 MG SL tablet Place 1 tablet (0.4 mg total) under the tongue every  5 (five) minutes as needed for chest pain. 25 tablet 3  . pantoprazole (PROTONIX) 40 MG tablet Take 2 tablets (80 mg total) by mouth daily. 180 tablet 0  . rosuvastatin (CRESTOR) 40 MG tablet TAKE 1 TABLET(40 MG) BY MOUTH DAILY 90 tablet 1  . sacubitril-valsartan (ENTRESTO) 24-26 MG Take 1 tablet by mouth 2 (two) times daily.    . saw palmetto 500 MG capsule Take 500 mg by mouth daily.    . tadalafil (CIALIS) 20 MG tablet TAKE ONE TABLET BY MOUTH DAILY AS NEEDED 10 tablet 3  .  Zinc Sulfate (ZINC 15 PO) Take 1 tablet by mouth daily.     No current facility-administered medications on file prior to visit.    Allergies  Allergen Reactions  . Amlodipine Other (See Comments)    Ankle swelling with 10mg , ok with 5mg  dose  . Sulfamethoxazole Rash    fever  . Sulfonamide Derivatives Rash and Other (See Comments)    Family History  Problem Relation Age of Onset  . Diabetes Mother   . Hypertension Mother   . Colon polyps Mother   . Diabetes Father   . Heart disease Father 67       Fatal MI  . Heart failure Father   . Colon polyps Father   . Pancreatic cancer Maternal Grandmother   . Stomach cancer Maternal Uncle   . Pancreatic cancer Maternal Uncle   . Colon cancer Neg Hx   . Esophageal cancer Neg Hx   . Rectal cancer Neg Hx   . Liver disease Neg Hx     BP 130/84 (BP Location: Right Arm, Patient Position: Sitting, Cuff Size: Large)   Pulse 81   Ht 6\' 3"  (1.905 m)   Wt 281 lb 12.8 oz (127.8 kg)   SpO2 97%   BMI 35.22 kg/m    Review of Systems Denies sob    Objective:   Physical Exam VITAL SIGNS:  See vs page GENERAL: no distress Pulses: dorsalis pedis intact bilat.   MSK: no deformity of the feet CV: no leg edema Skin:  no ulcer on the feet.  normal color and temp on the feet. Neuro: sensation is intact to touch on the feet    A1c=7.4%    Assessment & Plan:  Type 2 DM: uncontrolled N/D, due to meds.  We discussed limited non-insulin rx options.  Pt chooses to  continue the same synjardy for now  Patient Instructions  I have sent a prescription to your pharmacy, to replace repaglinide with nateglinide.   Please continue the same Synjardy for now.   check your blood sugar once a day.  vary the time of day when you check, between before the 3 meals, and at bedtime.  also check if you have symptoms of your blood sugar being too high or too low.  please keep a record of the readings and bring it to your next appointment here (or you can bring the meter itself).  You can write it on any piece of paper.  please call sooner if your blood sugar goes below 70, or if you have a lot of readings over 200.   Please come back for a follow-up appointment in 2 months.

## 2020-10-07 ENCOUNTER — Telehealth: Payer: Self-pay | Admitting: Pharmacist

## 2020-10-07 NOTE — Progress Notes (Addendum)
Chronic Care Management Pharmacy Assistant   Name: [name]  MRN: [id number] DOB: [birth year]    Reason for Encounter: Diabetic Disease State Call   Conditions to be addressed/monitored: DMII   Recent office visits:  None ID  Recent consult visits:  09/30/20 Dr. Romero Belling, ordered Nateglinide  Hospital visits:  None in previous 6 months  Medications: Outpatient Encounter Medications as of 10/07/2020  Medication Sig   albuterol (VENTOLIN HFA) 108 (90 Base) MCG/ACT inhaler Inhale 2 puffs into the lungs every 6 (six) hours as needed for wheezing or shortness of breath.   allopurinol (ZYLOPRIM) 300 MG tablet TAKE 1 TABLET(300 MG) BY MOUTH DAILY   aspirin EC 81 MG tablet Take 1 tablet (81 mg total) by mouth daily.   cholecalciferol (VITAMIN D) 1000 units tablet Take 1,000 Units by mouth daily.   clomiPHENE (CLOMID) 50 MG tablet Take 50 mg by mouth every other day.   colchicine 0.6 MG tablet Take 0.6 mg by mouth every 4 (four) hours as needed (gout pain).   docusate sodium (COLACE) 100 MG capsule Take 100 mg by mouth daily.   Empagliflozin-metFORMIN HCl ER (SYNJARDY XR) 12.10-998 MG TB24 Take 2 tablets by mouth daily.   ezetimibe (ZETIA) 10 MG tablet TAKE 1 TABLET(10 MG) BY MOUTH DAILY   fluticasone (FLONASE) 50 MCG/ACT nasal spray Place 2 sprays into both nostrils daily.   folic acid (FOLVITE) 1 MG tablet Take 1 mg by mouth daily.   glucose blood (ONETOUCH VERIO) test strip 1 each by Other route daily. And lancets 1/day (Patient taking differently: 1 each by Other route daily.)   metoprolol succinate (TOPROL-XL) 50 MG 24 hr tablet Take 50 mg by mouth daily. Take with or immediately following a meal.   nateglinide (STARLIX) 120 MG tablet Take 1 tablet (120 mg total) by mouth 3 (three) times daily with meals.   nitroGLYCERIN (NITROSTAT) 0.4 MG SL tablet Place 1 tablet (0.4 mg total) under the tongue every 5 (five) minutes as needed for chest pain.   pantoprazole (PROTONIX)  40 MG tablet Take 2 tablets (80 mg total) by mouth daily.   rosuvastatin (CRESTOR) 40 MG tablet TAKE 1 TABLET(40 MG) BY MOUTH DAILY   sacubitril-valsartan (ENTRESTO) 24-26 MG Take 1 tablet by mouth 2 (two) times daily.   saw palmetto 500 MG capsule Take 500 mg by mouth daily.   tadalafil (CIALIS) 20 MG tablet TAKE ONE TABLET BY MOUTH DAILY AS NEEDED   Zinc Sulfate (ZINC 15 PO) Take 1 tablet by mouth daily.   No facility-administered encounter medications on file as of 10/07/2020.    Recent Relevant Labs: Lab Results  Component Value Date/Time   HGBA1C 7.4 (A) 09/30/2020 01:08 PM   HGBA1C 7.5 (A) 01/10/2020 03:56 PM   HGBA1C 6.8 (H) 08/29/2019 08:47 AM   HGBA1C 7.2 (H) 06/29/2017 09:23 AM   MICROALBUR 1.0 08/29/2019 08:47 AM   MICROALBUR <0.7 06/14/2018 03:56 PM    Kidney Function Lab Results  Component Value Date/Time   CREATININE 0.89 07/05/2020 09:17 AM   CREATININE 0.68 02/11/2020 07:54 PM   CREATININE 0.91 04/07/2016 03:29 PM   GFR 102.00 07/05/2020 09:17 AM   GFRNONAA >60 02/11/2020 07:54 PM   GFRAA >60 02/11/2020 07:54 PM    Current antihyperglycemic regimen:  Synjardy 12.10-998 mg Nateglinide 120 mg TID  What recent interventions/DTPs have been made to improve glycemic control: Patient to continue medications  Have there been any recent hospitalizations or ED visits since last visit  with CPP?  None ID  Patient denies any readings below 70 and no hypoglycemic symptoms Patient denies any high readings above 200 and no hyperglycemic symptoms  How often are you checking your blood sugar? Patient states that he does not check blood sugar daily  What are your blood sugars ranging? 130-170 Fasting: 170 this morning Before meals:  After meals:  Bedtime:  During the week, how often does your blood glucose drop below 70? None ID  Are you checking your feet daily/regularly?Patient states that he has no issues with swelling, or sores that are not healing   Adherence  Review: Is the patient currently on a STATIN medication? Yes, Rosuvastatin Is the patient currently on ACE/ARB medication? Sucubitril-valsartan Does the patient have >5 day gap between last estimated fill dates? No  Star Rating Drugs: Rosuvastatin 07/02/20 90 ds Sucubitril-valsartan 07/26/20 90 ds  [name] Clinical Pharmacist Assistant [phone]  Time spent:20

## 2020-10-09 ENCOUNTER — Other Ambulatory Visit: Payer: Self-pay | Admitting: Cardiology

## 2020-10-09 NOTE — Progress Notes (Signed)
Spoke with [name] who stated that he called the Pharmacy to ask when his prescription for Nateglinide 120mg  would be ready. He stated that they informed him that his prescription was ready for pick up today, patient will pick up before the end of today.  Clinical Pharmacist Assistant [phone]  Time spent:5

## 2020-10-14 ENCOUNTER — Other Ambulatory Visit: Payer: Self-pay | Admitting: Internal Medicine

## 2020-10-30 ENCOUNTER — Ambulatory Visit (AMBULATORY_SURGERY_CENTER): Payer: Medicare Other | Admitting: Gastroenterology

## 2020-10-30 ENCOUNTER — Other Ambulatory Visit: Payer: Self-pay

## 2020-10-30 ENCOUNTER — Encounter: Payer: Self-pay | Admitting: Gastroenterology

## 2020-10-30 VITALS — BP 139/80 | HR 70 | Temp 97.7°F | Resp 15 | Ht 75.0 in | Wt 280.0 lb

## 2020-10-30 DIAGNOSIS — K3189 Other diseases of stomach and duodenum: Secondary | ICD-10-CM | POA: Diagnosis not present

## 2020-10-30 DIAGNOSIS — Z1212 Encounter for screening for malignant neoplasm of rectum: Secondary | ICD-10-CM | POA: Diagnosis not present

## 2020-10-30 DIAGNOSIS — R109 Unspecified abdominal pain: Secondary | ICD-10-CM | POA: Diagnosis not present

## 2020-10-30 DIAGNOSIS — R1012 Left upper quadrant pain: Secondary | ICD-10-CM

## 2020-10-30 DIAGNOSIS — K317 Polyp of stomach and duodenum: Secondary | ICD-10-CM

## 2020-10-30 DIAGNOSIS — K2970 Gastritis, unspecified, without bleeding: Secondary | ICD-10-CM | POA: Diagnosis not present

## 2020-10-30 DIAGNOSIS — Z1211 Encounter for screening for malignant neoplasm of colon: Secondary | ICD-10-CM | POA: Diagnosis not present

## 2020-10-30 DIAGNOSIS — K2950 Unspecified chronic gastritis without bleeding: Secondary | ICD-10-CM | POA: Diagnosis not present

## 2020-10-30 DIAGNOSIS — K2990 Gastroduodenitis, unspecified, without bleeding: Secondary | ICD-10-CM

## 2020-10-30 LAB — HM COLONOSCOPY

## 2020-10-30 MED ORDER — SODIUM CHLORIDE 0.9 % IV SOLN: 500.0000 mL | Freq: Once | INTRAVENOUS | Status: DC

## 2020-10-30 NOTE — Op Note (Signed)
[facility] Patient Name: [name] Procedure Date: 10/30/2020 3:22 PM MRN: [id number] Endoscopist: Meryl Dare , MD Age: 50 Referring MD:  Date of Birth: 1971-02-28 Gender: Male Account #: [id number] Procedure:                Upper GI endoscopy Indications:              Abdominal pain in the left upper quadrant Medicines:                Monitored Anesthesia Care Procedure:                Pre-Anesthesia Assessment:                           - Prior to the procedure, a History and Physical                            was performed, and patient medications and                            allergies were reviewed. The patient's tolerance of                            previous anesthesia was also reviewed. The risks                            and benefits of the procedure and the sedation                            options and risks were discussed with the patient.                            All questions were answered, and informed consent                            was obtained. Prior Anticoagulants: The patient has                            taken no previous anticoagulant or antiplatelet                            agents. ASA Grade Assessment: II - A patient with                            mild systemic disease. After reviewing the risks                            and benefits, the patient was deemed in                            satisfactory condition to undergo the procedure.                           After obtaining informed consent, the endoscope was  passed under direct vision. Throughout the                            procedure, the patient's blood pressure, pulse, and                            oxygen saturations were monitored continuously. The                            Endoscope was introduced through the mouth, and                            advanced to the second part of duodenum. The upper                            GI endoscopy  was accomplished without difficulty.                            The patient tolerated the procedure well. Scope In: Scope Out: Findings:                 The examined esophagus was normal.                           Patchy mildly erythematous mucosa without bleeding                            was found in the gastric body and in the gastric                            antrum. Biopsies were taken with a cold forceps for                            histology.                           A 8 mm suspected healed ulcer site was found on the                            greater curvature of the stomach. The scar tissue                            was healthy in appearance. Biopsies were taken with                            a cold forceps for histology.                           A single 7 mm sessile polyp with no bleeding and no                            stigmata of recent bleeding was found on the  anterior wall of the stomach in the cardia. The                            polyp was removed with a cold snare. Resection and                            retrieval were complete.                           The exam of the stomach was otherwise normal.                           The duodenal bulb and second portion of the                            duodenum were normal. Complications:            No immediate complications. Estimated Blood Loss:     Estimated blood loss was minimal. Impression:               - Normal esophagus.                           - Erythematous mucosa in the gastric body and                            antrum. Biopsied.                           - Scar in the greater curvature of the stomach.                            Biopsied.                           - A single gastric polyp. Resected and retrieved.                           - Normal duodenal bulb and second portion of the                            duodenum. Recommendation:           - Patient has a  contact number available for                            emergencies. The signs and symptoms of potential                            delayed complications were discussed with the                            patient. Return to normal activities tomorrow.                            Written discharge instructions were provided to the  patient.                           - Resume previous diet.                           - Continue present medications.                           - Await pathology results. Meryl Dare, MD 10/30/2020 4:02:03 PM This report has been signed electronically.

## 2020-10-30 NOTE — Patient Instructions (Signed)
YOU HAD AN ENDOSCOPIC PROCEDURE TODAY AT [facility]:   Refer to the procedure report that was given to you for any specific questions about what was found during the examination.  If the procedure report does not answer your questions, please call your gastroenterologist to clarify.  If you requested that your care partner not be given the details of your procedure findings, then the procedure report has been included in a sealed envelope for you to review at your convenience later.  YOU SHOULD EXPECT: Some feelings of bloating in the abdomen. Passage of more gas than usual.  Walking can help get rid of the air that was put into your GI tract during the procedure and reduce the bloating. If you had a lower endoscopy (such as a colonoscopy or flexible sigmoidoscopy) you may notice spotting of blood in your stool or on the toilet paper. If you underwent a bowel prep for your procedure, you may not have a normal bowel movement for a few days.  Please Note:  You might notice some irritation and congestion in your nose or some drainage.  This is from the oxygen used during your procedure.  There is no need for concern and it should clear up in a day or so.  SYMPTOMS TO REPORT IMMEDIATELY:   Following lower endoscopy (colonoscopy or flexible sigmoidoscopy):  Excessive amounts of blood in the stool  Significant tenderness or worsening of abdominal pains  Swelling of the abdomen that is new, acute  Fever of 100F or higher   Following upper endoscopy (EGD)  Vomiting of blood or coffee ground material  New chest pain or pain under the shoulder blades  Painful or persistently difficult swallowing  New shortness of breath  Fever of 100F or higher  Black, tarry-looking stools  For urgent or emergent issues, a gastroenterologist can be reached at any hour by calling [phone]. Do not use MyChart messaging for urgent concerns.    DIET:  We do recommend a small meal at first, but  then you may proceed to your regular diet.  Drink plenty of fluids but you should avoid alcoholic beverages for 24 hours.  ACTIVITY:  You should plan to take it easy for the rest of today and you should NOT DRIVE or use heavy machinery until tomorrow (because of the sedation medicines used during the test).    FOLLOW UP: Our staff will call the number listed on your records 48-72 hours following your procedure to check on you and address any questions or concerns that you may have regarding the information given to you following your procedure. If we do not reach you, we will leave a message.  We will attempt to reach you two times.  During this call, we will ask if you have developed any symptoms of COVID 19. If you develop any symptoms (ie: fever, flu-like symptoms, shortness of breath, cough etc.) before then, please call [phone].  If you test positive for Covid 19 in the 2 weeks post procedure, please call and report this information to [place].    If any biopsies were taken you will be contacted by phone or by letter within the next 1-3 weeks.  Please call us at [phone] if you have not heard about the biopsies in 3 weeks.    SIGNATURES/CONFIDENTIALITY: You and/or your care partner have signed paperwork which will be entered into your electronic medical record.  These signatures attest to the fact that that the information above on  your After Visit Summary has been reviewed and is understood.  Full responsibility of the confidentiality of this discharge information lies with you and/or your care-partner.   Resume medications. Information given on hemorrhoids and gastritis.

## 2020-10-30 NOTE — Progress Notes (Signed)
To pacu, VSS. Report to [email or web address] °

## 2020-10-30 NOTE — Op Note (Signed)
[facility] Patient Name: [name] Procedure Date: 10/30/2020 3:22 PM MRN: [id number] Endoscopist: Meryl Dare , MD Age: 50 Referring MD:  Date of Birth: March 18, 1971 Gender: Male Account #: [id number] Procedure:                Colonoscopy Indications:              Screening for colorectal malignant neoplasm Medicines:                Monitored Anesthesia Care Procedure:                Pre-Anesthesia Assessment:                           - Prior to the procedure, a History and Physical                            was performed, and patient medications and                            allergies were reviewed. The patient's tolerance of                            previous anesthesia was also reviewed. The risks                            and benefits of the procedure and the sedation                            options and risks were discussed with the patient.                            All questions were answered, and informed consent                            was obtained. Prior Anticoagulants: The patient has                            taken no previous anticoagulant or antiplatelet                            agents. ASA Grade Assessment: II - A patient with                            mild systemic disease. After reviewing the risks                            and benefits, the patient was deemed in                            satisfactory condition to undergo the procedure.                           After obtaining informed consent, the colonoscope  was passed under direct vision. Throughout the                            procedure, the patient's blood pressure, pulse, and                            oxygen saturations were monitored continuously. The                            Olympus CF-HQ190 [id number]) Colonoscope was                            introduced through the anus and advanced to the the                            cecum, identified by  appendiceal orifice and                            ileocecal valve. The ileocecal valve, appendiceal                            orifice, and rectum were photographed. The quality                            of the bowel preparation was good. The colonoscopy                            was performed without difficulty. The patient                            tolerated the procedure well. Scope In: 3:28:45 PM Scope Out: 3:42:50 PM Scope Withdrawal Time: 0 hours 10 minutes 1 second  Total Procedure Duration: 0 hours 14 minutes 5 seconds  Findings:                 The perianal and digital rectal examinations were                            normal.                           Internal hemorrhoids were found during                            retroflexion. The hemorrhoids were small and Grade                            I (internal hemorrhoids that do not prolapse).                           The exam was otherwise without abnormality on                            direct and retroflexion views. Complications:            No immediate complications.  Estimated blood loss:                            None. Estimated Blood Loss:     Estimated blood loss: none. Impression:               - Internal hemorrhoids.                           - The examination was otherwise normal on direct                            and retroflexion views.                           - No specimens collected. Recommendation:           - Repeat colonoscopy in 10 years for screening                            purposes.                           - Patient has a contact number available for                            emergencies. The signs and symptoms of potential                            delayed complications were discussed with the                            patient. Return to normal activities tomorrow.                            Written discharge instructions were provided to the                            patient.                            - Resume previous diet.                           - Continue present medications. Meryl Dare, MD 10/30/2020 3:45:11 PM This report has been signed electronically.

## 2020-10-30 NOTE — Progress Notes (Signed)
Called to room to assist during endoscopic procedure.  Patient ID and intended procedure confirmed with present staff. Received instructions for my participation in the procedure from the performing physician.  

## 2020-10-30 NOTE — Progress Notes (Signed)
VS by CW  I have reviewed the patient's medical history in detail and updated the computerized patient record.  

## 2020-11-01 ENCOUNTER — Telehealth: Payer: Self-pay | Admitting: *Deleted

## 2020-11-01 NOTE — Telephone Encounter (Signed)
  Follow up Call-  Call back number 10/30/2020  Post procedure Call Back phone  # [phone]  Permission to leave phone message Yes  Some recent data might be hidden     Patient questions:  Do you have a fever, pain , or abdominal swelling? No. Pain Score  0 *  Have you tolerated food without any problems? Yes.    Have you been able to return to your normal activities? Yes.    Do you have any questions about your discharge instructions: Diet   No. Medications  No. Follow up visit  No.  Do you have questions or concerns about your Care? No.  Actions: * If pain score is 4 or above: No action needed, pain <4

## 2020-11-04 ENCOUNTER — Other Ambulatory Visit: Payer: Self-pay | Admitting: Physician Assistant

## 2020-11-05 DIAGNOSIS — E119 Type 2 diabetes mellitus without complications: Secondary | ICD-10-CM | POA: Diagnosis not present

## 2020-11-05 DIAGNOSIS — H5213 Myopia, bilateral: Secondary | ICD-10-CM | POA: Diagnosis not present

## 2020-11-05 LAB — HM DIABETES EYE EXAM

## 2020-11-07 ENCOUNTER — Other Ambulatory Visit: Payer: Self-pay

## 2020-11-07 ENCOUNTER — Ambulatory Visit (INDEPENDENT_AMBULATORY_CARE_PROVIDER_SITE_OTHER): Payer: Medicare Other | Admitting: Pharmacist

## 2020-11-07 DIAGNOSIS — I10 Essential (primary) hypertension: Secondary | ICD-10-CM | POA: Diagnosis not present

## 2020-11-07 DIAGNOSIS — E785 Hyperlipidemia, unspecified: Secondary | ICD-10-CM | POA: Diagnosis not present

## 2020-11-07 DIAGNOSIS — E669 Obesity, unspecified: Secondary | ICD-10-CM

## 2020-11-07 DIAGNOSIS — E1169 Type 2 diabetes mellitus with other specified complication: Secondary | ICD-10-CM

## 2020-11-07 DIAGNOSIS — I25118 Atherosclerotic heart disease of native coronary artery with other forms of angina pectoris: Secondary | ICD-10-CM

## 2020-11-07 NOTE — Progress Notes (Signed)
Chronic Care Management Pharmacy Note  11/13/2020 Name:  [name] MRN:  [id number] DOB:  1970-08-24  Summary: -BP is vastly improved on Entresto, metoprolol (110/70 at home) -Pt cannot afford Arvella Nigh in donut hole  Recommendations/Changes made from today's visit: -Pursue PAP for Arvella Nigh. Applications faxed 7/0/01 -Provided free trial cards while we await PAP approval   Subjective: [name] is an 50 y.o. year old male who is a primary patient of Janith Lima, MD.  The CCM team was consulted for assistance with disease management and care coordination needs.    Engaged with patient by telephone for follow up visit in response to provider referral for pharmacy case management and/or care coordination services.   Consent to Services:  The patient was given information about Chronic Care Management services, agreed to services, and gave verbal consent prior to initiation of services.  Please see initial visit note for detailed documentation.   Patient Care Team: Janith Lima, MD as PCP - General (Internal Medicine) Dorothy Spark, MD (Inactive) as PCP - Cardiology (Cardiology) Kittie Plater as Consulting Physician (Rheumatology) Mosetta Anis, MD as Referring Physician (Rheumatology) Chesley Mires, MD as Consulting Physician (Pulmonary Disease) Renato Shin, MD as Consulting Physician (Endocrinology) Dorothy Spark, MD (Inactive) (Cardiology) Charlton Haws, [facility] as Pharmacist (Pharmacist) Webb Laws, [place] as Referring Physician (Optometry)  Recent office visits: 07/05/20 NP Jodi Mourning OV: LUQ pain. CT scan possible mild acute pancreatitis, rec'd MRI of pancreas. Enzymes normal. MRI results also normal - referred to GI.  03/21/20 Dr Ronnald Ramp OV: c/o urinary frequency, no evidence of infection  01/10/20 Dr Ronnald Ramp OV: ED f/u. A1c up to 7.5%, rec'd lifestyle modifications. IBS not improved with Bentyl. Try  Trulance.  09/26/19 Dr Ronnald Ramp OV: EF < 50% rec'd ECHO. EF 55-60%, no major concerns.  08/29/19 Dr Ronnald Ramp OV: chronic f/u, conditions stable. Ordered myocardial perfusion scan which showed EF < 50% but no obstructive disease.  Recent consult visits: 09/30/20 Dr Loanne Drilling (endocrine): f/u DM. A1c 7.4%. Switched repaglinide to nateglinide. Continue Synjardy. F/U 2 months.  09/03/20 Dr Fuller Plan (GI): f/u LUQ abd pain/GERD, improved on pantoprazole. Schedule EGD/colonoscopy. Constipation - start Miralax and colace daily.   01/25/20 Dr Chase Caller (Pulmonary): sarcoid flare, re-establishing care (last 3 years ago). Flare may be sarcoid, obesity, diastolic dysfunction, or asthma. Ordered CBC, HRCT, PFT, and 3rd Covid booster.  06/20/19 PA Lenze (cardiology): No chest pain, encouraged exercise. Had palpitations in June 2020, holding off on restarting a BB due to controlled BP and no recent palpitations  Hospital visits: None in previous 6 months   02/11/20 ED visit: nausea/vomiting. Blood work, CT scan reassuring. Treated for UTI with Keflex.  01/09/20 ED visit: RLQ pain, ruled out gastroenteritis, appendicitis, treated symptomatically with morphine and zofran. Discharged with Bentyl.   Objective:  Lab Results  Component Value Date   CREATININE 0.89 07/05/2020   BUN 13 07/05/2020   GFR 102.00 07/05/2020   GFRNONAA >60 02/11/2020   GFRAA >60 02/11/2020   NA 139 07/05/2020   K 3.6 07/05/2020   CALCIUM 9.8 07/05/2020   CO2 29 07/05/2020    Lab Results  Component Value Date/Time   HGBA1C 7.4 (A) 09/30/2020 01:08 PM   HGBA1C 7.5 (A) 01/10/2020 03:56 PM   HGBA1C 6.8 (H) 08/29/2019 08:47 AM   HGBA1C 7.2 (H) 06/29/2017 09:23 AM   GFR 102.00 07/05/2020 09:17 AM   GFR 106.83 08/29/2019 08:47 AM   MICROALBUR 1.0 08/29/2019 08:47 AM  MICROALBUR <0.7 06/14/2018 03:56 PM    Last diabetic Eye exam:  Lab Results  Component Value Date/Time   HMDIABEYEEXA No Retinopathy 11/05/2020 12:00 AM    Last  diabetic Foot exam:  Lab Results  Component Value Date/Time   HMDIABFOOTEX Done 04/10/2010 12:00 AM    MRI Pancreas 07/14/20: IMPRESSION: 1. No MR evidence of pancreatic mass or other acute findings in the abdomen. No pancreatic ductal dilatation. 2. Mild hepatic steatosis. 3. Status post cholecystectomy.  Lab Results  Component Value Date   CHOL 83 (L) 09/20/2019   HDL 32 (L) 09/20/2019   LDLCALC 37 09/20/2019   TRIG 57 09/20/2019   CHOLHDL 2.6 09/20/2019    Hepatic Function Latest Ref Rng & Units 07/05/2020 01/09/2020 08/29/2019  Total Protein 6.0 - 8.3 g/dL 8.1 7.6 7.5  Albumin 3.5 - 5.2 g/dL 4.5 4.0 4.2  AST 0 - 37 U/L _0 ALT 0 - 53 U/L _1 Alk Phosphatase 39 - 117 U/L 89 77 84  Total Bilirubin 0.2 - 1.2 mg/dL 0.4 0.3 0.4  Bilirubin, Direct 0.0 - 0.3 mg/dL - - 0.1    Lab Results  Component Value Date/Time   TSH 0.62 06/14/2018 03:56 PM   TSH 0.66 03/01/2017 12:05 PM   FREET4 0.7 11/13/2008 11:12 AM    CBC Latest Ref Rng & Units 07/05/2020 02/11/2020 01/09/2020  WBC 4.0 - 10.5 K/uL 6.1 8.5 6.5  Hemoglobin 13.0 - 17.0 g/dL 14.4 15.2 14.3  Hematocrit 39.0 - 52.0 % 42.7 45.0 41.7  Platelets 150.0 - 400.0 K/uL 269.0 249 241    No results found for: VD25OH  Clinical ASCVD: No  The ASCVD Risk score Mikey Bussing DC Jr., et al., 2013) failed to calculate for the following reasons:   The valid total cholesterol range is 130 to 320 mg/dL    Depression screen [facility] 2/9 07/22/2020 08/29/2019 06/14/2018  Decreased Interest 0 0 0  Down, Depressed, Hopeless 0 0 0  PHQ - 2 Score 0 0 0  Altered sleeping - 1 -  Tired, decreased energy - 0 -  Change in appetite - 0 -  Feeling bad or failure about yourself  - 0 -  Trouble concentrating - 0 -  Moving slowly or fidgety/restless - 0 -  Suicidal thoughts - 0 -  PHQ-9 Score - 1 -  Difficult doing work/chores - Not difficult at all -  Some recent data might be hidden     Social History   Tobacco Use  Smoking Status Former Smoker   . Packs/day: 1.00  . Years: 10.00  . Pack years: 10.00  . Types: Cigarettes  . Quit date: 05/18/2009  . Years since quitting: 11.4  Smokeless Tobacco Never Used   BP Readings from Last 3 Encounters:  10/30/20 139/80  09/30/20 130/84  09/03/20 (!) 148/72   Pulse Readings from Last 3 Encounters:  10/30/20 70  09/30/20 81  09/03/20 79   Wt Readings from Last 3 Encounters:  10/30/20 280 lb (127 kg)  09/30/20 281 lb 12.8 oz (127.8 kg)  09/03/20 280 lb 6.4 oz (127.2 kg)    Assessment/Interventions: Review of patient past medical history, allergies, medications, health status, including review of consultants reports, laboratory and other test data, was performed as part of comprehensive evaluation and provision of chronic care management services.   SDOH:  (Social Determinants of Health) assessments and interventions performed: Yes   CCM Care Plan  Allergies  Allergen Reactions  . Amlodipine Other (  See Comments)    Ankle swelling with 62m, ok with 532mdose  . Sulfamethoxazole Rash    fever  . Sulfonamide Derivatives Rash and Other (See Comments)    Medications Reviewed Today    Reviewed by FoCharlton HawsRPLowndes Ambulatory Surgery CenterPharmacist) on 11/13/20 at 1029  Med List Status: <None>  Medication Order Taking? Sig Documenting Provider Last Dose Status Informant  albuterol (VENTOLIN HFA) 108 (90 Base) MCG/ACT inhaler [id number] Inhale 2 puffs into the lungs every 6 (six) hours as needed for wheezing or shortness of breath. JoJanith LimaMD Taking Active Self  allopurinol (ZYLOPRIM) 300 MG tablet [id number] TAKE 1 TABLET(300 MG) BY MOUTH DAILY JoJanith LimaMD Taking Active   aspirin EC 81 MG tablet [id number] Take 1 tablet (81 mg total) by mouth daily. LeRowe ClackMD Taking Active Self  cholecalciferol (VITAMIN D) 1000 units tablet [id number] Take 1,000 Units by mouth daily. [provider] Taking Active Self  clomiPHENE (CLOMID) 50 MG tablet [id number] Take 50 mg by mouth every other day. [provider] Taking Active   colchicine 0.6 MG tablet [id number] Take 0.6 mg by mouth every 4 (four) hours as needed (gout pain). [provider] Taking Active Self           Med Note (FLenord FellersLICleaster Corin Tue Aug 29, 2019 11:32 AM)    docusate sodium (COLACE) 100 MG capsule [id number] Take 100 mg by mouth daily. [provider] Taking Active   Empagliflozin-metFORMIN HCl ER (SYNJARDY XR) 12.10-998 MG TB24 [id number] Take 2 tablets by mouth daily. ElRenato ShinMD Taking Active   ezetimibe (ZETIA) 10 MG tablet [id number] TAKE 1 TABLET(10 MG) BY MOUTH DAILY. PLEASE MAKE OVERDUE APPOINTMENT WITH CARDIOLOGIST BEFORE ANYMORE REFILLS. THANK YOU 2 ND ATTEMPT PeFreada BergeronMD Taking Active   fluticasone (FLONASE) 50 MCG/ACT nasal spray [id number] Place 2 sprays into both nostrils daily. MuMarrian SalvageFNP Taking Active   folic acid (FOLVITE) 1 MG tablet [id number] Take 1 mg by mouth daily. [provider] Taking Active Self           Med Note (HEarley Brookean 21, 2019  8:49 AM)    glucose blood (OThomas B Finan CenterERIO) test strip [id number] 1 each by Other route daily. And lancets 1/day  Patient taking differently: 1 each by Other route daily.   ElRenato ShinMD Taking Active   metoprolol succinate (TOPROL-XL) 50 MG 24 hr tablet [id number] Take 50 mg by mouth daily. Take with or immediately following a meal. [provider] Taking Active   nateglinide (STARLIX) 120 MG tablet [id number] Take 1 tablet (120 mg total) by mouth 3 (three) times daily with meals. ElRenato ShinMD Taking Active   nitroGLYCERIN (NITROSTAT) 0.4 MG SL tablet [id number] Place 1 tablet (0.4 mg total) under the tongue every 5 (five) minutes as needed for chest pain. SiLyda Jester, PA-C Taking Active Self  pantoprazole (PROTONIX) 40 MG tablet [id number] Take 2 tablets (80 mg total) by  mouth daily. JoJanith LimaMD Taking Active   rosuvastatin (CRESTOR) 40 MG tablet [id number] TAKE 1 TABLET(40 MG) BY MOUTH DAILY JoJanith LimaMD Taking Active   sacubitril-valsartan (EUpper [facility] Medical Center49-51 MG [id number] Take 1 tablet by mouth 2 (two) times daily. [provider] Taking Active   saw palmetto 500 MG capsule [id number] Take 500  mg by mouth daily. [provider] Taking Active   tadalafil (CIALIS) 20 MG tablet [id number] Yes TAKE ONE TABLET BY MOUTH DAILY AS NEEDED McKenzie, Candee Furbish, MD Taking Active   Zinc Sulfate (ZINC 15 PO) [id number] Yes Take 1 tablet by mouth daily. [provider] Taking Active           Patient Active Problem List   Diagnosis Date Noted  . Urinary frequency 03/21/2020  . Irritable bowel syndrome with constipation 01/10/2020  . Ejection fraction < 50% 09/26/2019  . LVH (left ventricular hypertrophy) due to hypertensive disease, with heart failure ([place]) 09/26/2019  . Routine general medical examination at a health care facility 06/14/2018  . Hepatic steatosis 11/03/2017  . Primary narcolepsy without cataplexy 06/22/2017  . Periodic limb movements of sleep 06/22/2017  . Hypersomnia 06/22/2017  . Nonintractable epilepsy with complex partial seizures ([place]) 03/23/2017  . Drug-induced erectile dysfunction 11/06/2015  . Body mass index (BMI) of 35.0 to 35.9 with comorbidity   . Sarcoidosis ([place]) 09/29/2010  . GERD 01/07/2010  . Exercise-induced asthma 12/03/2009  . POLYNEUROPATHY 10/17/2009  . Male hypogonadism 10/08/2009  . Diabetes mellitus type 2 in obese ([place]) 10/07/2009  . Hyperlipidemia with target LDL less than 70 10/07/2009  . Gout 10/07/2009  . Essential hypertension 10/07/2009  . CAD (coronary artery disease) 10/07/2009  . Male erectile dysfunction 10/07/2009    Immunization History  Administered Date(s) Administered  . Influenza Split 04/06/2011, 02/17/2012, 03/16/2012  . Influenza,inj,Quad PF,6+ Mos  03/21/2013, 03/08/2014, 04/03/2015, 02/19/2016, 02/17/2017, 06/14/2018, 02/20/2019, 03/21/2020  . Moderna Sars-Covid-2 Vaccination 07/31/2019, 09/05/2019, 04/19/2020  . Pneumococcal Conjugate-13 04/29/2015  . Pneumococcal Polysaccharide-23 11/06/2015  . Tdap 03/21/2013    Conditions to be addressed/monitored:  Hypertension, Hyperlipidemia, Diabetes, Heart Failure and Coronary Artery Disease  Patient Care Plan: CCM Pharmacy Care Plan    Problem Identified: Hypertension, Hyperlipidemia, Diabetes, Heart Failure and Coronary Artery Disease   Priority: High    Long-Range Goal: Disease management   Start Date: 08/06/2020  Expected End Date: 02/06/2021  This Visit's Progress: On track  Recent Progress: On track  Priority: High  Note:   Current Barriers:  . Unable to independently monitor therapeutic efficacy . Unable to maintain control of blood pressure  Pharmacist Clinical Goal(s):  Marland Kitchen Patient will achieve adherence to monitoring guidelines and medication adherence to achieve therapeutic efficacy . maintain control of BP as evidenced by improved home readings  through collaboration with PharmD and provider.   Interventions: . 1:1 collaboration with Janith Lima, MD regarding development and update of comprehensive plan of care as evidenced by provider attestation and co-signature . Inter-disciplinary care team collaboration (see longitudinal plan of care) . Comprehensive medication review performed; medication list updated in electronic medical record  Hypertension / Heart failure (BP goal < 130/80) Controlled - pt reports vast improvement in BP since adjustment to BP meds by Dr Terrence Dupont; home BP 110s/70s Pt reports BP meds were changed April 2022 per cardiologist (outside [facility]) - stopped irbesartan/HCTZ and torsemide, started metoprolol and Entresto Current regimen:  ? Metoprolol succinate 50 mg daily ? Entresto 49-51 mg BID Interventions: ? Discussed BP goals and benefits of  medications for prevention of heart attack / stroke ? Patient has regular follow up with cardiologist ? Assessed pt finances - he should qualify for Entresto PAP [employer]); pt signed paperwork; faxed application 0/8/65; provided 30-day free trial card while we await approval ? Recommend to continue current medications   Hyperlipidemia / CAD (LDL goal <  37)   CAD: 09/2019 nuclear stress test low risk study. Controlled  - LDL 37 Current regimen:  ? rosuvastatin 40 mg daily,  ? ezetimibe 10 mg daily,  ? nitroglycerin 0.4 mg as needed ? aspirin 81 mg daily    Interventions: ? Discussed cholesterol goals and benefits of medications for prevention of heart attack / stroke ? Recommend to continue current medication   Diabetes (A1c goal < 7%) Not ideally controlled - last A1c 7.4%; pt follows with Dr Loanne Drilling; April 2022 repaglinide was changed to nateglinide -Fasting BG 130, Post-prandial BG 160 Current regimen:  ? Synjardy XR 12.10-998 mg - 2 tablets daily  ? Nateglinidine 120 mg TID w/ meals Past tried/failed meds: Invokana, Farxiga, [name], pioglitazone (edema) Interventions: ? Discussed blood sugar goals and benefits of medications for prevention of diabetic complications ? Discussed history of pancreatitis (09/2017) limits use of other classes of diabetes medications (GLP-1 and DPP-IV) ? Discussed ways to improve diet - switch to diet sodas/juices ? Assessed pt financies - patient should qualify for Synjardy PAP ([employer]); pt signed paperwork; faxed applicaion 10/09/59; provided 14-day free trial card while we await approval ? Recommend to continue current medication  Patient Goals/Self-Care Activities . Patient will:  - take medications as prescribed check glucose daily, document, and provide at future appointments check blood pressure daily, document, and provide at future appointments weigh daily, and contact provider if weight gain of 2+ lbs overnight or 5+ lbs in a  week -collaborate with providers on medication access solutions Iva Boop, Delene Loll)  Follow Up Plan: Telephone follow up appointment with care management team member scheduled for:3 months     Medication Assistance:  Iva Boop [facility]) - application faxed 09/09/29 Delene Loll [employer]) - application faxed 10/10/98 Faxed free trial cards to [facility] while we await PAP approval  Compliance/Adherence/Medication fill history: Care Gaps: Hepatitis C screening Covid vaccine dose #4 (due 07/20/20)  Star-Rating Drugs: Synjardy - LF 11/04/20 x 90 ds Rosuvastatin - LF 10/06/20 x 90 ds Nateglinide - LF 10/09/20 x 90 ds Entresto - LF 07/26/20 x 90 ds   Patient's preferred pharmacy is:  [id number] [address], [place] [address] [place] [address] Phone: [phone] Fax: [phone]  [facility] [address], [place] [address] Phone: [phone] Fax: [phone]  Uses pill box? Yes Pt endorses 100% compliance  We discussed: Current pharmacy is preferred with insurance plan and patient is satisfied with pharmacy services Patient decided to: Continue current medication management strategy  Care Plan and Follow Up Patient Decision:  Patient agrees to Care Plan and Follow-up.  Plan: Telephone follow up appointment with care management team member scheduled for:  3 months  Charlene Brooke, PharmD, [place], CPP Clinical Pharmacist [facility] at [facility] [phone]

## 2020-11-12 ENCOUNTER — Telehealth: Payer: Self-pay | Admitting: Internal Medicine

## 2020-11-12 NOTE — Telephone Encounter (Signed)
Patient is requesting a call back in regards to what he signed on Friday. He said that he signed some paper work and is wanting to know what it was. Please advise

## 2020-11-12 NOTE — Telephone Encounter (Signed)
Patient signed patient assistance applications for [place]. I have sent the applications to the manufacturers. If approved he will be able to get the medications mailed to him for free for the rest of the year.

## 2020-11-13 ENCOUNTER — Telehealth: Payer: Self-pay | Admitting: Internal Medicine

## 2020-11-13 NOTE — Telephone Encounter (Signed)
[employer] called and said that they did not receive all the patient assistance forms for [facility] and asked that it be re faxed. Please advise   Fax: [phone]

## 2020-11-13 NOTE — Telephone Encounter (Signed)
   Patient calling to report the application for assistance is not being receiving by manufacture. The only page coming across is the cover sheet. Please re-fax

## 2020-11-13 NOTE — Patient Instructions (Signed)
Visit Information  Phone number for Pharmacist: [phone]  Goals Addressed            This Visit's Progress   . Manage My Medicine       Timeframe:  Long-Range Goal Priority:  Medium Start Date:    11/13/20                         Expected End Date:   05/15/21                    Follow Up Date Sept 2022   - call for medicine refill 2 or 3 days before it runs out - call if I am sick and can't take my medicine - keep a list of all the medicines I take; vitamins and herbals too -Collaborate with providers for Lenord Carbo patient assistance    Why is this important?   . These steps will help you keep on track with your medicines.   Notes:       Patient verbalizes understanding of instructions provided today and agrees to view in MyChart.  Telephone follow up appointment with pharmacy team member scheduled for: 3 months  Al Corpus, PharmD, [place], CPP Clinical Pharmacist [facility] at [facility] [phone]

## 2020-11-14 ENCOUNTER — Encounter: Payer: Self-pay | Admitting: Gastroenterology

## 2020-11-14 ENCOUNTER — Other Ambulatory Visit: Payer: Self-pay | Admitting: Urology

## 2020-11-14 ENCOUNTER — Telehealth: Payer: Self-pay | Admitting: Internal Medicine

## 2020-11-14 NOTE — Telephone Encounter (Signed)
Re-faxed Entresto application.  Received fax today that [facility] application has been approved.

## 2020-11-14 NOTE — Telephone Encounter (Signed)
Patient is requesting a medication for a possible kidney infection. Please advise

## 2020-11-14 NOTE — Telephone Encounter (Signed)
No call back number provided to discuss.

## 2020-11-14 NOTE — Telephone Encounter (Signed)
Called pt, LVM stating that he would need an OV.

## 2020-11-17 NOTE — Progress Notes (Signed)
Subjective:    Patient ID: [name], male    DOB: 1970-09-24, 50 y.o.   MRN: [id number]  HPI The patient is here for an acute visit.    ? Kidney infection - for about one week he started having urinary frequency and left lower back pain.  Sometimes he goes and it is a little urine.  He has discomfort at times but it is more from a feeling of needing to go.     Had urinary freq 10/21 - Udip neg ED 02/2020 - N/V, flank pain  - UA, Ucx neg, Ct renal - no stones.   Has had been on flomax in past - stopped due to hypotension.  Sees urology annually.    Medications and allergies reviewed with patient and updated if appropriate.  Patient Active Problem List   Diagnosis Date Noted   Urinary frequency 03/21/2020   Irritable bowel syndrome with constipation 01/10/2020   Ejection fraction < 50% 09/26/2019   LVH (left ventricular hypertrophy) due to hypertensive disease, with heart failure (HCC) 09/26/2019   Routine general medical examination at a health care facility 06/14/2018   Hepatic steatosis 11/03/2017   Primary narcolepsy without cataplexy 06/22/2017   Periodic limb movements of sleep 06/22/2017   Hypersomnia 06/22/2017   Nonintractable epilepsy with complex partial seizures (HCC) 03/23/2017   Drug-induced erectile dysfunction 11/06/2015   Body mass index (BMI) of 35.0 to 35.9 with comorbidity    Sarcoidosis (HCC) 09/29/2010   GERD 01/07/2010   Exercise-induced asthma 12/03/2009   POLYNEUROPATHY 10/17/2009   Male hypogonadism 10/08/2009   Diabetes mellitus type 2 in obese (HCC) 10/07/2009   Hyperlipidemia with target LDL less than 70 10/07/2009   Gout 10/07/2009   Essential hypertension 10/07/2009   CAD (coronary artery disease) 10/07/2009   Male erectile dysfunction 10/07/2009    Current Outpatient Medications on File Prior to Visit  Medication Sig Dispense Refill   albuterol (VENTOLIN HFA) 108 (90 Base) MCG/ACT inhaler Inhale 2 puffs into the lungs every 6  (six) hours as needed for wheezing or shortness of breath. 3 Inhaler 1   allopurinol (ZYLOPRIM) 300 MG tablet TAKE 1 TABLET(300 MG) BY MOUTH DAILY 90 tablet 1   aspirin EC 81 MG tablet Take 1 tablet (81 mg total) by mouth daily. 30 tablet 3   cholecalciferol (VITAMIN D) 1000 units tablet Take 1,000 Units by mouth daily.     clomiPHENE (CLOMID) 50 MG tablet Take 50 mg by mouth every other day.     colchicine 0.6 MG tablet Take 0.6 mg by mouth every 4 (four) hours as needed (gout pain).     docusate sodium (COLACE) 100 MG capsule Take 100 mg by mouth daily.     Empagliflozin-metFORMIN HCl ER (SYNJARDY XR) 12.10-998 MG TB24 Take 2 tablets by mouth daily. 180 tablet 3   ezetimibe (ZETIA) 10 MG tablet TAKE 1 TABLET(10 MG) BY MOUTH DAILY. PLEASE MAKE OVERDUE APPOINTMENT WITH CARDIOLOGIST BEFORE ANYMORE REFILLS. THANK YOU 2 ND ATTEMPT 15 tablet 0   fluticasone (FLONASE) 50 MCG/ACT nasal spray Place 2 sprays into both nostrils daily. 16 g 6   folic acid (FOLVITE) 1 MG tablet Take 1 mg by mouth daily.  1   glucose blood (ONETOUCH VERIO) test strip 1 each by Other route daily. And lancets 1/day (Patient taking differently: 1 each by Other route daily.) 100 each 11   metoprolol succinate (TOPROL-XL) 50 MG 24 hr tablet Take 50 mg by mouth daily. Take with or  immediately following a meal.     nateglinide (STARLIX) 120 MG tablet Take 1 tablet (120 mg total) by mouth 3 (three) times daily with meals. 270 tablet 3   nitroGLYCERIN (NITROSTAT) 0.4 MG SL tablet Place 1 tablet (0.4 mg total) under the tongue every 5 (five) minutes as needed for chest pain. 25 tablet 3   pantoprazole (PROTONIX) 40 MG tablet Take 2 tablets (80 mg total) by mouth daily. 180 tablet 0   rosuvastatin (CRESTOR) 40 MG tablet TAKE 1 TABLET(40 MG) BY MOUTH DAILY 90 tablet 1   sacubitril-valsartan (ENTRESTO) 49-51 MG Take 1 tablet by mouth 2 (two) times daily.     saw palmetto 500 MG capsule Take 500 mg by mouth daily.     tadalafil (CIALIS) 20  MG tablet TAKE ONE TABLET BY MOUTH DAILY AS NEEDED 10 tablet 3   Zinc Sulfate (ZINC 15 PO) Take 1 tablet by mouth daily.     No current facility-administered medications on file prior to visit.    Past Medical History:  Diagnosis Date   Anginal pain (HCC)    Arthritis    ? 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 Followed by Dr. Nickola Major   Arthritis    "ankles" (04/13/2016)   Asthma    CORONARY ARTERY DISEASE    a. cath 05/2014: mild LAD disease, normal LCx and RCA. Risk factor modification recommended.   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)    MSLT 01/09/09 & MRI brain 01/09/09   Pericarditis    recurrent   POLYNEUROPATHY    PULMONARY SARCOIDOSIS    Mediastinal lymphadenopathy with biospy proven sarcodosis   Seizures (HCC)    "none in 4-5 years; don't know what kind; not related to alcohol" (04/13/2016)   TESTICULAR HYPOFUNCTION    TIA (transient ischemic attack)    "I don't remember when" (04/13/2016)    Past Surgical History:  Procedure Laterality Date   BRONCHOSCOPY  08/21/08   CARDIAC CATHETERIZATION  06/25/2009   minimal disease   CARDIAC CATHETERIZATION N/A 04/14/2016   Procedure: Left Heart Cath and Coronary Angiography;  Surgeon: Kathleene Hazel, MD;  Location: [facility] INVASIVE CV LAB;  Service: Cardiovascular;  Laterality: N/A;   CHOLECYSTECTOMY N/A 09/21/2017   Procedure: LAPAROSCOPIC CHOLECYSTECTOMY WITH INTRAOPERATIVE CHOLANGIOGRAM;  Surgeon: Glenna Fellows, MD;  Location: [facility] ORS;  Service: General;  Laterality: N/A;   LEFT HEART CATHETERIZATION WITH CORONARY ANGIOGRAM N/A 05/18/2014   Procedure: LEFT HEART CATHETERIZATION WITH CORONARY ANGIOGRAM;  Surgeon: Corky Crafts, MD;  Location: [facility] CATH LAB;  Service: Cardiovascular;  Laterality: N/A;   MEDIASTINOSCOPY  11/30/08    Social History    Socioeconomic History   Marital status: Significant Other    Spouse name: Not on file   Number of children: 3   Years of education: Not on file   Highest education level: Not on file  Occupational History   Occupation: Unemployed  Tobacco Use   Smoking status: Former    Packs/day: 1.00    Years: 10.00    Pack years: 10.00    Types: Cigarettes    Quit date: 05/18/2009    Years since quitting: 11.5   Smokeless tobacco: Never  Vaping Use   Vaping Use: Never used  Substance and Sexual Activity   Alcohol use: Yes  Comment: rare   Drug use: No   Sexual activity: Yes  Other Topics Concern   Not on file  Social History Narrative   Married, lives with wife and 3 kids.    Currently student. 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 [employer]   Social Determinants of Corporate investment banker Strain: Low Risk    Difficulty of Paying Living Expenses: Not hard at [employer] Insecurity: No Food Insecurity   Worried About Programme researcher, broadcasting/film/video in the Last Year: Never true   Barista in the Last Year: Never true  Transportation Needs: No Transportation Needs   Lack of Transportation (Medical): No   Lack of Transportation (Non-Medical): No  Physical Activity: Inactive   Days of Exercise per Week: 0 days   Minutes of Exercise per Session: 0 min  Stress: No Stress Concern Present   Feeling of Stress : Not at all  Social Connections: Socially Integrated   Frequency of Communication with Friends and Family: More than three times a week   Frequency of Social Gatherings with Friends and Family: More than three times a week   Attends Religious Services: More than 4 times per year   Active Member of [employer] or Organizations: No   Attends Engineer, structural: More than 4 times per year   Marital Status: Living with partner    Family History  Problem Relation Age of Onset   Diabetes Mother    Hypertension Mother    Colon polyps Mother    Diabetes Father    Heart  disease Father 73       Fatal MI   Heart failure Father    Colon polyps Father    Pancreatic cancer Maternal Grandmother    Stomach cancer Maternal Uncle    Pancreatic cancer Maternal Uncle    Colon cancer Neg Hx    Esophageal cancer Neg Hx    Rectal cancer Neg Hx    Liver disease Neg Hx     Review of Systems  Constitutional:  Negative for chills and fever.  Gastrointestinal:  Negative for abdominal pain, blood in stool, constipation, diarrhea and nausea.  Genitourinary:  Positive for difficulty urinating (now, weak stream), frequency and urgency. Negative for dysuria and hematuria.       Urine dark initially  Musculoskeletal:  Positive for back pain (left lower back, occ across lower back after urinating.  sometimes radiates to hip).  Neurological:  Negative for light-headedness.      Objective:   Vitals:   11/18/20 0810  BP: 126/80  Pulse: 75  Temp: 98.1 F (36.7 C)  SpO2: 97%   BP Readings from Last 3 Encounters:  11/18/20 126/80  10/30/20 139/80  09/30/20 130/84   Wt Readings from Last 3 Encounters:  11/18/20 273 lb 3.2 oz (123.9 kg)  10/30/20 280 lb (127 kg)  09/30/20 281 lb 12.8 oz (127.8 kg)   Body mass index is 34.15 kg/m.   Physical Exam Constitutional:      General: He is not in acute distress.    Appearance: Normal appearance. He is not ill-appearing.  HENT:     Head: Normocephalic and atraumatic.  Abdominal:     General: There is no distension.     Palpations: Abdomen is soft.     Tenderness: There is no abdominal tenderness. There is no right CVA tenderness, left CVA tenderness, guarding or rebound.  Musculoskeletal:        General: No  tenderness (no lumbar spine tendernss or left lower back tenderness).  Neurological:     Mental Status: He is alert.           Assessment & Plan:    Urinary frequency: Acute Associated with some difficulty urination/weak stream Urine dip with glucose only Unlikely infection - Will send urine for  culture to confirm no infection More likely related to prostate Retry flomax 0.4 mg daily - he is aware of side effects Can f/u with urology   Glucosuria: Not new A1c > 7 Discussed with him his sugars should be better controlled - advised lifestyle changes until he sees Dr Yetta Barre - advised to try to avoid more medication he needs to adjust diet, lose weight, exercise  Lower back pain, left: Acute Has had a couple of episodes Likely msk Interested in further evaluation Refer to sports med   This visit occurred during the SARS-CoV-2 public health emergency.  Safety protocols were in place, including screening questions prior to the visit, additional usage of staff PPE, and extensive cleaning of exam room while observing appropriate contact time as indicated for disinfecting solutions.

## 2020-11-18 ENCOUNTER — Ambulatory Visit (INDEPENDENT_AMBULATORY_CARE_PROVIDER_SITE_OTHER): Payer: Medicare Other | Admitting: Internal Medicine

## 2020-11-18 ENCOUNTER — Other Ambulatory Visit: Payer: Self-pay | Admitting: Internal Medicine

## 2020-11-18 ENCOUNTER — Other Ambulatory Visit: Payer: Self-pay

## 2020-11-18 ENCOUNTER — Encounter: Payer: Self-pay | Admitting: Internal Medicine

## 2020-11-18 VITALS — BP 126/80 | HR 75 | Temp 98.1°F | Ht 75.0 in | Wt 273.2 lb

## 2020-11-18 DIAGNOSIS — R350 Frequency of micturition: Secondary | ICD-10-CM

## 2020-11-18 DIAGNOSIS — R300 Dysuria: Secondary | ICD-10-CM | POA: Diagnosis not present

## 2020-11-18 DIAGNOSIS — M545 Low back pain, unspecified: Secondary | ICD-10-CM | POA: Diagnosis not present

## 2020-11-18 DIAGNOSIS — R81 Glycosuria: Secondary | ICD-10-CM | POA: Diagnosis not present

## 2020-11-18 LAB — POC URINALSYSI DIPSTICK (AUTOMATED)
Bilirubin, UA: NEGATIVE
Blood, UA: NEGATIVE
Glucose, UA: POSITIVE — AB
Ketones, UA: NEGATIVE
Leukocytes, UA: NEGATIVE
Nitrite, UA: NEGATIVE
Protein, UA: NEGATIVE
Spec Grav, UA: 1.015 (ref 1.010–1.025)
Urobilinogen, UA: 0.2 E.U./dL
pH, UA: 5.5 (ref 5.0–8.0)

## 2020-11-18 MED ORDER — TAMSULOSIN HCL 0.4 MG PO CAPS: 0.4000 mg | ORAL_CAPSULE | Freq: Every day | ORAL | 1 refills | Status: DC

## 2020-11-18 NOTE — Patient Instructions (Signed)
  We will send your urine for a culture.    Medications changes include :   flomax daily  Your prescription(s) have been submitted to your pharmacy. Please take as directed and contact our office if you believe you are having problem(s) with the medication(s).   A referral was ordered for sports medicine.        Someone from their office will call you to schedule an appointment.

## 2020-11-19 LAB — URINE CULTURE: Result:: NO GROWTH
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
## 2020-11-21 ENCOUNTER — Ambulatory Visit: Payer: Medicare Other | Admitting: Family Medicine

## 2020-11-21 ENCOUNTER — Encounter: Payer: Self-pay | Admitting: Family Medicine

## 2020-11-21 ENCOUNTER — Other Ambulatory Visit: Payer: Self-pay

## 2020-11-21 VITALS — BP 158/98 | HR 83 | Ht 75.0 in | Wt 273.4 lb

## 2020-11-21 DIAGNOSIS — M5442 Lumbago with sciatica, left side: Secondary | ICD-10-CM | POA: Diagnosis not present

## 2020-11-21 DIAGNOSIS — G8929 Other chronic pain: Secondary | ICD-10-CM

## 2020-11-21 MED ORDER — TIZANIDINE HCL 4 MG PO TABS: 4.0000 mg | ORAL_TABLET | Freq: Three times a day (TID) | ORAL | 1 refills | Status: DC | PRN

## 2020-11-21 NOTE — Patient Instructions (Addendum)
Thank you for coming in today.   I've referred you to Physical Therapy.  Let us know if you don't hear from them in one week.   Heating pad will be helpful.   TENS unit can help.   TENS UNIT: This is helpful for muscle pain and spasm.   Search and Purchase a TENS 7000 2nd edition at  www.tenspros.com or www.Amazon.com It should be less than $30.     TENS unit instructions: Do not shower or bathe with the unit on Turn the unit off before removing electrodes or batteries If the electrodes lose stickiness add a drop of water to the electrodes after they are disconnected from the unit and place on plastic sheet. If you continued to have difficulty, call the TENS unit company to purchase more electrodes. Do not apply lotion on the skin area prior to use. Make sure the skin is clean and dry as this will help prolong the life of the electrodes. After use, always check skin for unusual red areas, rash or other skin difficulties. If there are any skin problems, does not apply electrodes to the same area. Never remove the electrodes from the unit by pulling the wires. Do not use the TENS unit or electrodes other than as directed. Do not change electrode placement without consultating your therapist or physician. Keep 2 fingers with between each electrode. Wear time ratio is 2:1, on to off times.    For example on for 30 minutes off for 15 minutes and then on for 30 minutes off for 15 minutes   Use tizanidine muscle relaxer as needed mostly at bedtime.   Recheck in about 6 weeks.   If you have a problem please let me know.

## 2020-11-22 ENCOUNTER — Telehealth: Payer: Self-pay | Admitting: Pharmacist

## 2020-11-22 NOTE — Progress Notes (Signed)
    Chronic Care Management Pharmacy Assistant   Name: JERMEY CLOSS  MRN: [id number] DOB: 02/03/1971   Called [employer] to check the status of Entresto for the patient, per the customer service rep only half of the application was received and needs to be re-faxed.  Benedict Needy, RMA Clinical Pharmacists Assistant [phone]  Time Spent: 30

## 2020-11-27 DIAGNOSIS — E785 Hyperlipidemia, unspecified: Secondary | ICD-10-CM | POA: Diagnosis not present

## 2020-11-27 DIAGNOSIS — I10 Essential (primary) hypertension: Secondary | ICD-10-CM | POA: Diagnosis not present

## 2020-11-27 DIAGNOSIS — M109 Gout, unspecified: Secondary | ICD-10-CM | POA: Diagnosis not present

## 2020-11-27 DIAGNOSIS — I25118 Atherosclerotic heart disease of native coronary artery with other forms of angina pectoris: Secondary | ICD-10-CM | POA: Diagnosis not present

## 2020-11-28 NOTE — Telephone Encounter (Signed)
Received fax from [employer] that [place] patient assistance has been approved.  Received fax from [facility] that Synjardy PAP has been approved.

## 2020-12-02 ENCOUNTER — Other Ambulatory Visit: Payer: Self-pay | Admitting: Internal Medicine

## 2020-12-02 DIAGNOSIS — E785 Hyperlipidemia, unspecified: Secondary | ICD-10-CM

## 2020-12-02 DIAGNOSIS — I2510 Atherosclerotic heart disease of native coronary artery without angina pectoris: Secondary | ICD-10-CM

## 2020-12-03 ENCOUNTER — Ambulatory Visit: Payer: Medicare Other | Admitting: Endocrinology

## 2020-12-04 ENCOUNTER — Telehealth: Payer: Self-pay | Admitting: Internal Medicine

## 2020-12-04 NOTE — Telephone Encounter (Addendum)
Pt stated that he can not see urology until 7/22. He stated that he seen Sports Med and the recommended physical therapy. Would like to follow up with PCP. Is it ok to schedule in a buffer?

## 2020-12-04 NOTE — Telephone Encounter (Signed)
Patient was seen by Dr.Burns on 6.13.22 concerning his frequent urination and back pain. Patient still isn't feeling better and is having the same problems going on, wondering if he should come back in the office or if there is something for him to do.   Please advise

## 2020-12-04 NOTE — Telephone Encounter (Signed)
I did advised following up with urology, who he sees annually and he did see sports med for  his back ----    Yes he should f/u with Dr Yetta Barre to discuss his symptoms further.

## 2020-12-04 NOTE — Progress Notes (Signed)
    Chronic Care Management Pharmacy Assistant   Name: ANIKIN PROSSER  MRN: [id number] DOB: Aug 22, 1970   Reason for Encounter: Chart Review   Medications: Outpatient Encounter Medications as of 11/22/2020  Medication Sig   albuterol (VENTOLIN HFA) 108 (90 Base) MCG/ACT inhaler Inhale 2 puffs into the lungs every 6 (six) hours as needed for wheezing or shortness of breath.   allopurinol (ZYLOPRIM) 300 MG tablet TAKE 1 TABLET(300 MG) BY MOUTH DAILY   aspirin EC 81 MG tablet Take 1 tablet (81 mg total) by mouth daily.   cholecalciferol (VITAMIN D) 1000 units tablet Take 1,000 Units by mouth daily.   clomiPHENE (CLOMID) 50 MG tablet Take 50 mg by mouth every other day.   colchicine 0.6 MG tablet Take 0.6 mg by mouth every 4 (four) hours as needed (gout pain).   docusate sodium (COLACE) 100 MG capsule Take 100 mg by mouth daily.   Empagliflozin-metFORMIN HCl ER (SYNJARDY XR) 12.10-998 MG TB24 Take 2 tablets by mouth daily.   ezetimibe (ZETIA) 10 MG tablet TAKE 1 TABLET(10 MG) BY MOUTH DAILY. PLEASE MAKE OVERDUE APPOINTMENT WITH CARDIOLOGIST BEFORE ANYMORE REFILLS. THANK YOU 2 ND ATTEMPT   fluticasone (FLONASE) 50 MCG/ACT nasal spray Place 2 sprays into both nostrils daily.   folic acid (FOLVITE) 1 MG tablet Take 1 mg by mouth daily.   glucose blood (ONETOUCH VERIO) test strip 1 each by Other route daily. And lancets 1/day (Patient taking differently: 1 each by Other route daily.)   metoprolol succinate (TOPROL-XL) 50 MG 24 hr tablet Take 50 mg by mouth daily. Take with or immediately following a meal.   nateglinide (STARLIX) 120 MG tablet Take 1 tablet (120 mg total) by mouth 3 (three) times daily with meals.   nitroGLYCERIN (NITROSTAT) 0.4 MG SL tablet Place 1 tablet (0.4 mg total) under the tongue every 5 (five) minutes as needed for chest pain.   pantoprazole (PROTONIX) 40 MG tablet Take 2 tablets (80 mg total) by mouth daily.   sacubitril-valsartan (ENTRESTO) 49-51 MG Take 1 tablet by  mouth 2 (two) times daily.   saw palmetto 500 MG capsule Take 500 mg by mouth daily.   tadalafil (CIALIS) 20 MG tablet TAKE ONE TABLET BY MOUTH DAILY AS NEEDED   tamsulosin (FLOMAX) 0.4 MG CAPS capsule Take 1 capsule (0.4 mg total) by mouth daily.   tiZANidine (ZANAFLEX) 4 MG tablet Take 1 tablet (4 mg total) by mouth every 8 (eight) hours as needed for muscle spasms.   Zinc Sulfate (ZINC 15 PO) Take 1 tablet by mouth daily.   [DISCONTINUED] rosuvastatin (CRESTOR) 40 MG tablet TAKE 1 TABLET(40 MG) BY MOUTH DAILY   No facility-administered encounter medications on file as of 11/22/2020.   Pharmacist Review  Reviewed chart for medication changes and adherence.  No OVs, Consults, or hospital visits since last care coordination call / Pharmacist visit. No medication changes indicated  No gaps in adherence identified. Patient has follow up scheduled with pharmacy team. No further action required.   Time spent:3

## 2020-12-05 ENCOUNTER — Ambulatory Visit: Payer: Medicare Other | Admitting: Internal Medicine

## 2020-12-05 ENCOUNTER — Encounter: Payer: Self-pay | Admitting: Internal Medicine

## 2020-12-05 ENCOUNTER — Other Ambulatory Visit: Payer: Self-pay

## 2020-12-05 VITALS — BP 126/74 | HR 66 | Temp 98.5°F | Ht 75.0 in | Wt 247.0 lb

## 2020-12-05 DIAGNOSIS — E669 Obesity, unspecified: Secondary | ICD-10-CM | POA: Diagnosis not present

## 2020-12-05 DIAGNOSIS — D869 Sarcoidosis, unspecified: Secondary | ICD-10-CM

## 2020-12-05 DIAGNOSIS — E785 Hyperlipidemia, unspecified: Secondary | ICD-10-CM | POA: Diagnosis not present

## 2020-12-05 DIAGNOSIS — N410 Acute prostatitis: Secondary | ICD-10-CM

## 2020-12-05 DIAGNOSIS — M1000 Idiopathic gout, unspecified site: Secondary | ICD-10-CM

## 2020-12-05 DIAGNOSIS — E1169 Type 2 diabetes mellitus with other specified complication: Secondary | ICD-10-CM

## 2020-12-05 DIAGNOSIS — Z125 Encounter for screening for malignant neoplasm of prostate: Secondary | ICD-10-CM

## 2020-12-05 DIAGNOSIS — R300 Dysuria: Secondary | ICD-10-CM | POA: Diagnosis not present

## 2020-12-05 DIAGNOSIS — Z0000 Encounter for general adult medical examination without abnormal findings: Secondary | ICD-10-CM

## 2020-12-05 DIAGNOSIS — Z1159 Encounter for screening for other viral diseases: Secondary | ICD-10-CM | POA: Diagnosis not present

## 2020-12-05 DIAGNOSIS — I10 Essential (primary) hypertension: Secondary | ICD-10-CM | POA: Diagnosis not present

## 2020-12-05 LAB — URINALYSIS, ROUTINE W REFLEX MICROSCOPIC
Bilirubin Urine: NEGATIVE
Hgb urine dipstick: NEGATIVE
Leukocytes,Ua: NEGATIVE
Nitrite: NEGATIVE
RBC / HPF: NONE SEEN (ref 0–?)
Specific Gravity, Urine: 1.02 (ref 1.000–1.030)
Total Protein, Urine: NEGATIVE
Urine Glucose: >=1000 — AB
Urobilinogen, UA: 1 (ref 0.0–1.0)
WBC, UA: NONE SEEN (ref 0–?)
pH: 6 (ref 5.0–8.0)

## 2020-12-05 LAB — LIPID PANEL
Cholesterol: 98 mg/dL (ref 0–200)
HDL: 32.2 mg/dL — ABNORMAL LOW (ref >39.00)
LDL Cholesterol: 42 mg/dL (ref 0–99)
NonHDL: 65.44
Total CHOL/HDL Ratio: 3
Triglycerides: 115 mg/dL (ref 0.0–149.0)
VLDL: 23 mg/dL (ref 0.0–40.0)

## 2020-12-05 LAB — URIC ACID: Uric Acid, Serum: 2.3 mg/dL — ABNORMAL LOW (ref 4.0–7.8)

## 2020-12-05 LAB — HEMOGLOBIN A1C: Hgb A1c MFr Bld: 7.9 % — ABNORMAL HIGH (ref 4.6–6.5)

## 2020-12-05 LAB — MICROALBUMIN / CREATININE URINE RATIO
Creatinine,U: 95.7 mg/dL
Microalb Creat Ratio: 4.9 mg/g (ref 0.0–30.0)
Microalb, Ur: 4.7 mg/dL — ABNORMAL HIGH (ref 0.0–1.9)

## 2020-12-05 LAB — BASIC METABOLIC PANEL
BUN: 12 mg/dL (ref 6–23)
CO2: 27 mEq/L (ref 19–32)
Calcium: 9.2 mg/dL (ref 8.4–10.5)
Chloride: 106 mEq/L (ref 96–112)
Creatinine, Ser: 0.89 mg/dL (ref 0.40–1.50)
GFR: 101.7 mL/min (ref >60.00)
Glucose, Bld: 136 mg/dL — ABNORMAL HIGH (ref 70–99)
Potassium: 3.8 mEq/L (ref 3.5–5.1)
Sodium: 142 mEq/L (ref 135–145)

## 2020-12-05 LAB — PSA: PSA: 1.31 ng/mL (ref 0.10–4.00)

## 2020-12-05 MED ORDER — AMOXICILLIN-POT CLAVULANATE 875-125 MG PO TABS
1.0000 | ORAL_TABLET | Freq: Two times a day (BID) | ORAL | 0 refills | Status: DC
Start: 2020-12-05 — End: 2021-02-13

## 2020-12-05 MED ORDER — EZETIMIBE 10 MG PO TABS: 10.0000 mg | ORAL_TABLET | Freq: Every day | ORAL | 1 refills | Status: DC

## 2020-12-05 NOTE — Telephone Encounter (Signed)
Pt has been scheduled for today at 2:20 pm

## 2020-12-05 NOTE — Patient Instructions (Signed)

## 2020-12-05 NOTE — Progress Notes (Signed)
Subjective:  Patient ID: [name], male    DOB: 11-Jun-1970  Age: 50 y.o. MRN: [id number]  CC: Annual Exam  This visit occurred during the SARS-CoV-2 public health emergency.  Safety protocols were in place, including screening questions prior to the visit, additional usage of staff PPE, and extensive cleaning of exam room while observing appropriate contact time as indicated for disinfecting solutions.    HPI [name] presents for  CPX and f/up -   He complains of a 2-week history of urinary frequency, dysuria, nocturia, and weak urine stream.  He denies hematuria, pelvic pain, abdominal pain, or flank pain.  He has chronic unchanged low back pain.  Outpatient Medications Prior to Visit  Medication Sig Dispense Refill   albuterol (VENTOLIN HFA) 108 (90 Base) MCG/ACT inhaler Inhale 2 puffs into the lungs every 6 (six) hours as needed for wheezing or shortness of breath. 3 Inhaler 1   allopurinol (ZYLOPRIM) 300 MG tablet TAKE 1 TABLET(300 MG) BY MOUTH DAILY 90 tablet 1   aspirin EC 81 MG tablet Take 1 tablet (81 mg total) by mouth daily. 30 tablet 3   cholecalciferol (VITAMIN D) 1000 units tablet Take 1,000 Units by mouth daily.     clomiPHENE (CLOMID) 50 MG tablet Take 50 mg by mouth every other day.     colchicine 0.6 MG tablet Take 0.6 mg by mouth every 4 (four) hours as needed (gout pain).     docusate sodium (COLACE) 100 MG capsule Take 100 mg by mouth daily.     Empagliflozin-metFORMIN HCl ER (SYNJARDY XR) 12.10-998 MG TB24 Take 2 tablets by mouth daily. 180 tablet 3   fluticasone (FLONASE) 50 MCG/ACT nasal spray Place 2 sprays into both nostrils daily. 16 g 6   folic acid (FOLVITE) 1 MG tablet Take 1 mg by mouth daily.  1   glucose blood (ONETOUCH VERIO) test strip 1 each by Other route daily. And lancets 1/day (Patient taking differently: 1 each by Other route daily.) 100 each 11   metoprolol succinate (TOPROL-XL) 50 MG 24 hr tablet Take 50 mg by mouth daily. Take  with or immediately following a meal.     nateglinide (STARLIX) 120 MG tablet Take 1 tablet (120 mg total) by mouth 3 (three) times daily with meals. 270 tablet 3   nitroGLYCERIN (NITROSTAT) 0.4 MG SL tablet Place 1 tablet (0.4 mg total) under the tongue every 5 (five) minutes as needed for chest pain. 25 tablet 3   pantoprazole (PROTONIX) 40 MG tablet Take 2 tablets (80 mg total) by mouth daily. 180 tablet 0   rosuvastatin (CRESTOR) 40 MG tablet TAKE 1 TABLET(40 MG) BY MOUTH DAILY 90 tablet 1   sacubitril-valsartan (ENTRESTO) 49-51 MG Take 1 tablet by mouth 2 (two) times daily.     saw palmetto 500 MG capsule Take 500 mg by mouth daily.     tadalafil (CIALIS) 20 MG tablet TAKE ONE TABLET BY MOUTH DAILY AS NEEDED 10 tablet 3   tamsulosin (FLOMAX) 0.4 MG CAPS capsule Take 1 capsule (0.4 mg total) by mouth daily. 30 capsule 1   tiZANidine (ZANAFLEX) 4 MG tablet Take 1 tablet (4 mg total) by mouth every 8 (eight) hours as needed for muscle spasms. 60 tablet 1   Zinc Sulfate (ZINC 15 PO) Take 1 tablet by mouth daily.     ezetimibe (ZETIA) 10 MG tablet TAKE 1 TABLET(10 MG) BY MOUTH DAILY. PLEASE MAKE OVERDUE APPOINTMENT WITH CARDIOLOGIST BEFORE ANYMORE REFILLS. THANK YOU  2 ND ATTEMPT 15 tablet 0   No facility-administered medications prior to visit.    ROS Review of Systems  Constitutional:  Negative for chills, diaphoresis and fever.  HENT: Negative.    Eyes: Negative.   Respiratory:  Negative for cough, chest tightness, shortness of breath and wheezing.   Cardiovascular:  Negative for chest pain, palpitations and leg swelling.  Gastrointestinal:  Negative for abdominal pain, constipation, diarrhea, nausea and vomiting.  Endocrine: Negative.   Genitourinary:  Positive for difficulty urinating, dysuria and frequency. Negative for decreased urine volume, flank pain, hematuria, penile pain, scrotal swelling, testicular pain and urgency.  Musculoskeletal:  Positive for back pain. Negative for  arthralgias, joint swelling and myalgias.  Skin: Negative.   Neurological: Negative.  Negative for dizziness, weakness and light-headedness.  Hematological:  Negative for adenopathy. Does not bruise/bleed easily.  Psychiatric/Behavioral: Negative.     Objective:  BP 126/74 (BP Location: Right Arm, Patient Position: Sitting, Cuff Size: Large)   Pulse 66   Temp 98.5 F (36.9 C) (Oral)   Ht  (1.905 m)   Wt 247 lb (112 kg)   SpO2 98%   BMI 30.87 kg/m   BP Readings from Last 3 Encounters:  12/05/20 126/74  11/21/20 (!) 158/98  11/18/20 126/80    Wt Readings from Last 3 Encounters:  12/05/20 247 lb (112 kg)  11/21/20 273 lb 6.4 oz (124 kg)  11/18/20 273 lb 3.2 oz (123.9 kg)    Physical Exam Vitals reviewed.  Constitutional:      Appearance: Normal appearance.  HENT:     Nose: Nose normal.     Mouth/Throat:     Mouth: Mucous membranes are moist.  Eyes:     General: No scleral icterus.    Conjunctiva/sclera: Conjunctivae normal.  Cardiovascular:     Rate and Rhythm: Normal rate and regular rhythm.     Heart sounds: No murmur heard. Pulmonary:     Effort: Pulmonary effort is normal.     Breath sounds: No stridor. No wheezing, rhonchi or rales.  Abdominal:     General: Abdomen is protuberant. Bowel sounds are normal. There is no distension.     Palpations: Abdomen is soft. There is no hepatomegaly, splenomegaly or mass.     Tenderness: There is no abdominal tenderness. There is no guarding.     Hernia: There is no hernia in the left inguinal area or right inguinal area.  Genitourinary:    Pubic Area: No rash.      Penis: Normal and circumcised.      Testes: Normal.        Right: Mass, tenderness or swelling not present.        Left: Mass, tenderness or swelling not present.     Epididymis:     Right: Normal. Not inflamed or enlarged. No mass.     Left: Normal. Not inflamed or enlarged. No mass.     Prostate: Enlarged and tender. No nodules present.     Rectum:  Normal. Guaiac result negative. No mass, tenderness, anal fissure, external hemorrhoid or internal hemorrhoid. Normal anal tone.  Musculoskeletal:        General: Normal range of motion.     Cervical back: Neck supple.     Right lower leg: No edema.     Left lower leg: No edema.  Lymphadenopathy:     Lower Body: No right inguinal adenopathy. No left inguinal adenopathy.  Skin:    General: Skin is dry.  Neurological:  General: No focal deficit present.     Mental Status: He is alert.  Psychiatric:        Mood and Affect: Mood normal.        Behavior: Behavior normal.    Lab Results  Component Value Date   WBC 6.1 07/05/2020   HGB 14.4 07/05/2020   HCT 42.7 07/05/2020   PLT 269.0 07/05/2020   GLUCOSE 136 (H) 12/05/2020   CHOL 98 12/05/2020   TRIG 115.0 12/05/2020   HDL 32.20 (L) 12/05/2020   LDLCALC 42 12/05/2020   ALT 22 07/05/2020   AST 17 07/05/2020   NA 142 12/05/2020   K 3.8 12/05/2020   CL 106 12/05/2020   CREATININE 0.89 12/05/2020   BUN 12 12/05/2020   CO2 27 12/05/2020   TSH 0.62 06/14/2018   PSA 1.31 12/05/2020   INR 0.89 05/24/2016   HGBA1C 7.9 (H) 12/05/2020   MICROALBUR 4.7 (H) 12/05/2020    MR Abdomen W Wo Contrast  Result Date: 07/15/2020 CLINICAL DATA:  Rule out pancreatic mass EXAM: MRI ABDOMEN WITHOUT AND WITH CONTRAST TECHNIQUE: Multiplanar multisequence MR imaging of the abdomen was performed both before and after the administration of intravenous contrast. CONTRAST:  58mL MULTIHANCE GADOBENATE DIMEGLUMINE 529 MG/ML IV SOLN COMPARISON:  CT abdomen pelvis, 07/08/2018 FINDINGS: Lower chest: No acute findings. Hepatobiliary: Mild hepatic steatosis. No mass or other parenchymal abnormality identified. Status post cholecystectomy. No biliary ductal dilatation. Pancreas: No mass, inflammatory changes, or other parenchymal abnormality identified. No pancreatic ductal dilatation. Spleen:  Within normal limits in size and appearance. Adrenals/Urinary Tract: No  masses identified. Small bilateral fluid signal, nonenhancing renal cysts. No evidence of hydronephrosis. Stomach/Bowel: Visualized portions within the abdomen are unremarkable. Vascular/Lymphatic: No pathologically enlarged lymph nodes identified. No abdominal aortic aneurysm demonstrated. Other:  None. Musculoskeletal: No suspicious bone lesions identified. IMPRESSION: 1. No MR evidence of pancreatic mass or other acute findings in the abdomen. No pancreatic ductal dilatation. 2. Mild hepatic steatosis. 3. Status post cholecystectomy. Electronically Signed   By: Lauralyn Primes M.D.   On: 07/15/2020 07:23    Assessment & Plan:   Tamaj was seen today for annual exam.  Diagnoses and all orders for this visit:  Hyperlipidemia with target LDL less than 70- LDL goal achieved. Doing well on the statin  -     ezetimibe (ZETIA) 10 MG tablet; Take 1 tablet (10 mg total) by mouth daily.  Routine general medical examination at a health care facility- Exam completed, labs reviewed, vaccines reviewed and updated, cancer screenings are up-to-date, patient education was given. -     Lipid panel; Future -     PSA; Future -     PSA -     Lipid panel  Essential hypertension- His blood pressure is adequately well. -     Basic metabolic panel; Future -     Urinalysis, Routine w reflex microscopic; Future -     Urinalysis, Routine w reflex microscopic -     Basic metabolic panel  Diabetes mellitus type 2 in obese (HCC)- His A1c is up to 7.9%.  He will continue to work on his lifestyle modifications and will continue the current glycemic agents. -     Basic metabolic panel; Future -     Hemoglobin A1c; Future -     Microalbumin / creatinine urine ratio; Future -     Microalbumin / creatinine urine ratio -     Hemoglobin A1c -     Basic metabolic panel  Idiopathic gout, unspecified chronicity, unspecified site- He has had no recent gout attacks. -     Uric acid; Future -     Uric acid  Sarcoidosis  (HCC)- There is no evidence of recurrence. -     Basic metabolic panel; Future -     Basic metabolic panel  Dysuria- Urine culture is positive for staph but this is likely a contaminate.  Will treat for acute bacterial prostatitis with Augmentin. -     CULTURE, URINE COMPREHENSIVE; Future -     CULTURE, URINE COMPREHENSIVE  Acute bacterial prostatitis -     amoxicillin-clavulanate (AUGMENTIN) 875-125 MG tablet; Take 1 tablet by mouth 2 (two) times daily.  Need for hepatitis C screening test -     Hepatitis C antibody; Future -     Hepatitis C antibody  I have discontinued Sion T. Karis's amoxicillin-clavulanate and amoxicillin-clavulanate. I have also changed his ezetimibe. Additionally, I am having him start on amoxicillin-clavulanate. Lastly, I am having him maintain his aspirin EC, cholecalciferol, folic acid, nitroGLYCERIN, albuterol, colchicine, glucose blood, clomiPHENE, saw palmetto, fluticasone, metoprolol succinate, sacubitril-valsartan, Zinc Sulfate (ZINC 15 PO), docusate sodium, pantoprazole, nateglinide, Synjardy XR, allopurinol, tadalafil, tamsulosin, tiZANidine, and rosuvastatin.  Meds ordered this encounter  Medications   ezetimibe (ZETIA) 10 MG tablet    Sig: Take 1 tablet (10 mg total) by mouth daily.    Dispense:  90 tablet    Refill:  1   amoxicillin-clavulanate (AUGMENTIN) 875-125 MG tablet    Sig: Take 1 tablet by mouth 2 (two) times daily.    Dispense:  60 tablet    Refill:  0     Follow-up: Return in about 6 weeks (around 01/16/2021).  Sanda Linger, MD

## 2020-12-06 ENCOUNTER — Telehealth: Payer: Self-pay | Admitting: Pharmacist

## 2020-12-06 NOTE — Progress Notes (Signed)
error 

## 2020-12-07 LAB — CULTURE, URINE COMPREHENSIVE

## 2020-12-07 LAB — HEPATITIS C ANTIBODY
Hepatitis C Ab: NONREACTIVE
SIGNAL TO CUT-OFF: 0.01 (ref <1.00)

## 2020-12-11 ENCOUNTER — Ambulatory Visit: Payer: Medicare Other | Admitting: Physical Therapy

## 2020-12-23 ENCOUNTER — Ambulatory Visit: Payer: Medicare Other | Admitting: Physical Therapy

## 2020-12-24 IMAGING — CT CT RENAL STONE PROTOCOL
2 of 4 series · 17 of 46 positions shown, 19 images · non-contrast
Comparison: 01/09/2020

CLINICAL DATA: Left flank pain

EXAM:
CT ABDOMEN AND PELVIS WITHOUT CONTRAST
TECHNIQUE: Multidetector CT imaging of the abdomen and pelvis was performed
following the standard protocol without IV contrast.

[Series 2: axial st · axial · 0.80mm/px · z∈[-360,+120]mm · 14 of 105 slices shown, 16 images]
[im 5/105  soft-tissue]
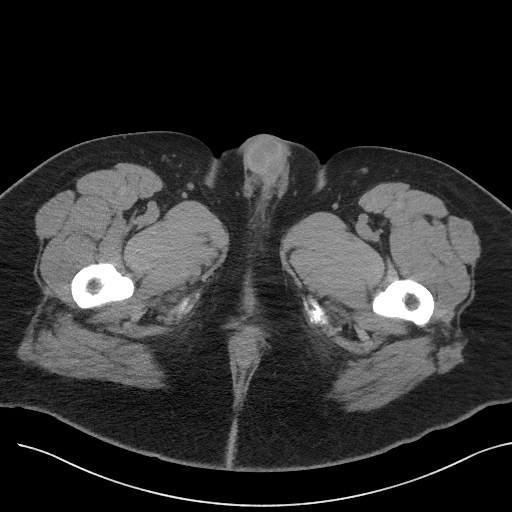
[im 5/105  bone]
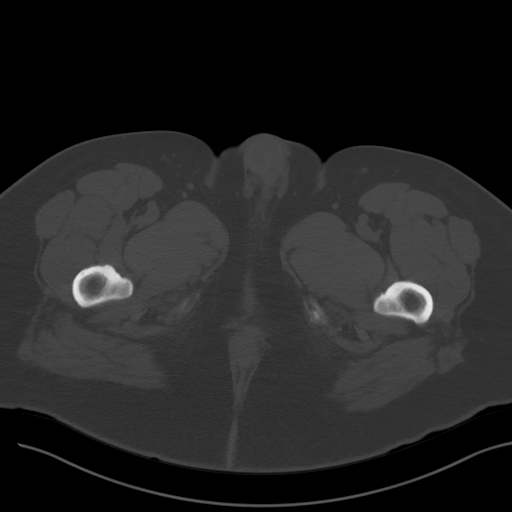
[im 13/105  soft-tissue]
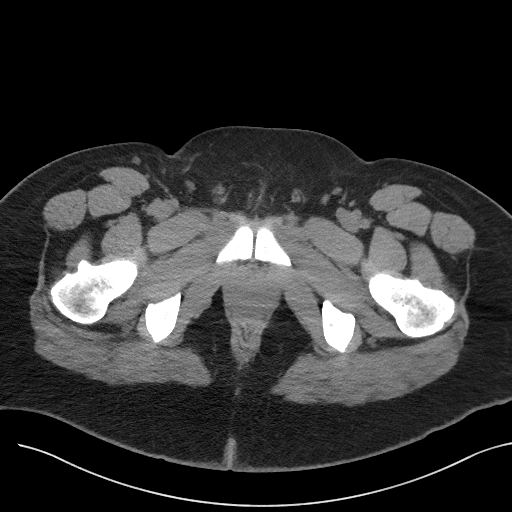
[im 21/105  soft-tissue]
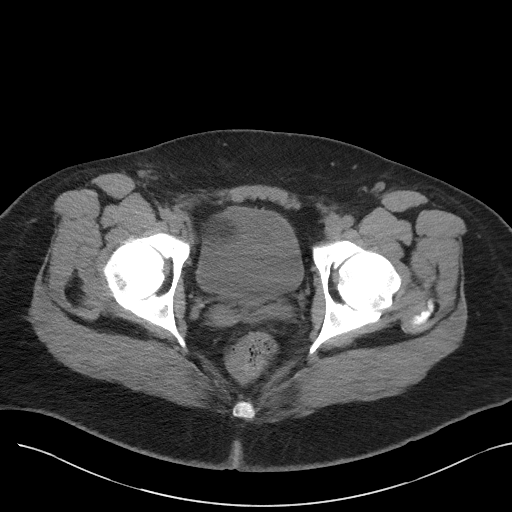
[im 29/105  soft-tissue]
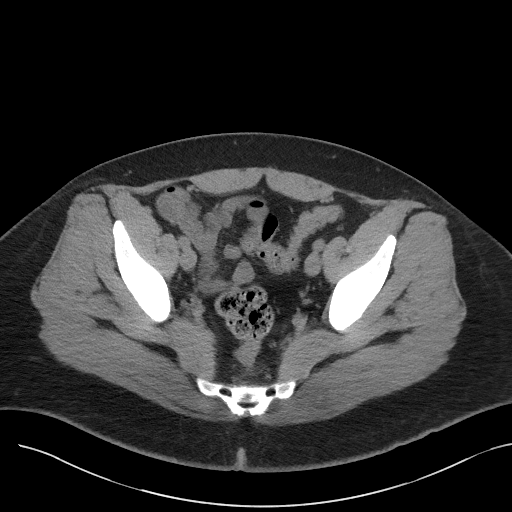
[im 37/105  soft-tissue]
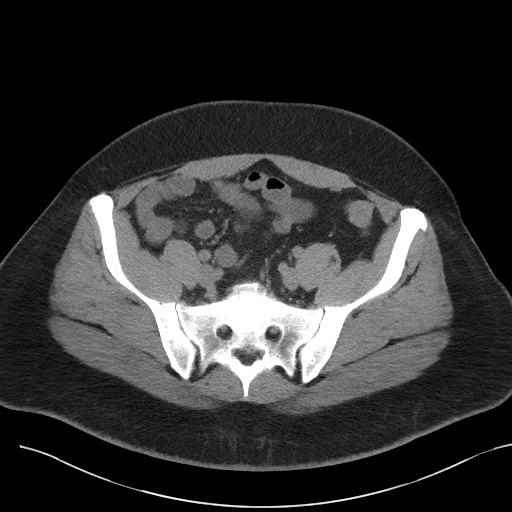
[im 41/105  soft-tissue]
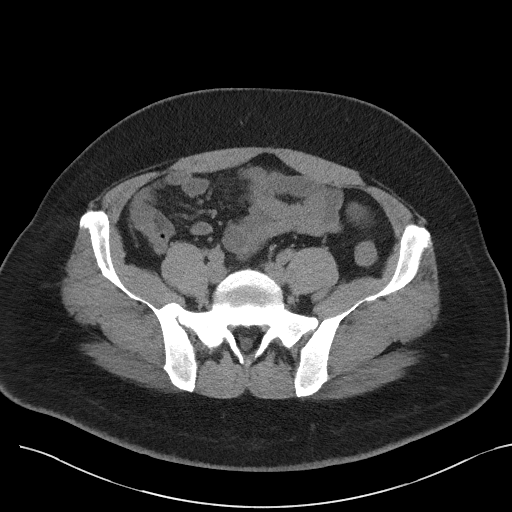
[im 49/105  soft-tissue]
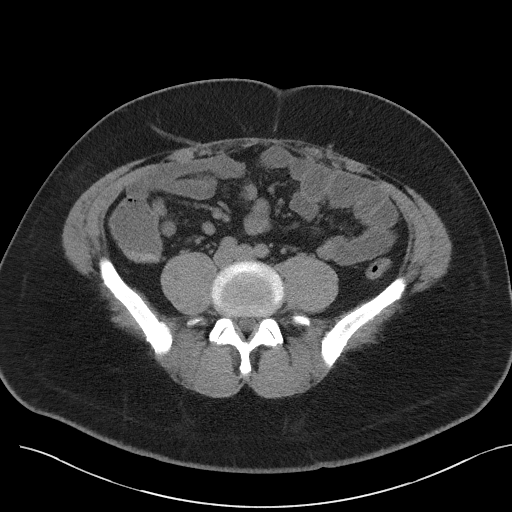
[im 57/105  soft-tissue]
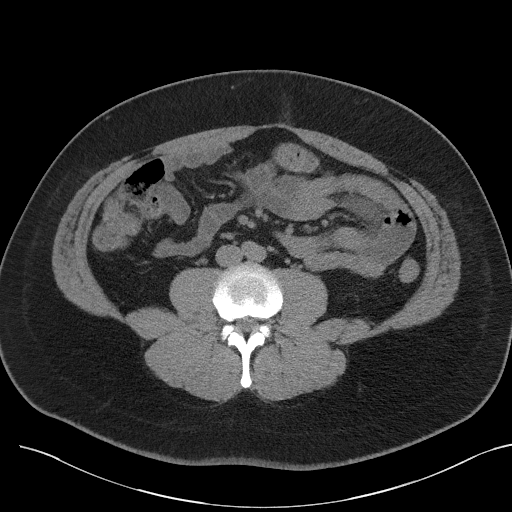
[im 65/105  soft-tissue]
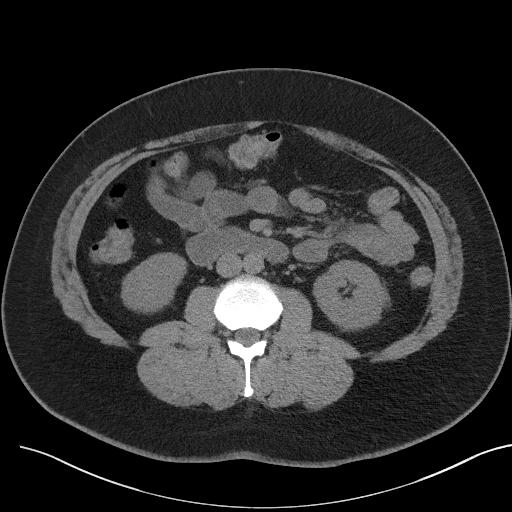
[im 65/105  bone]
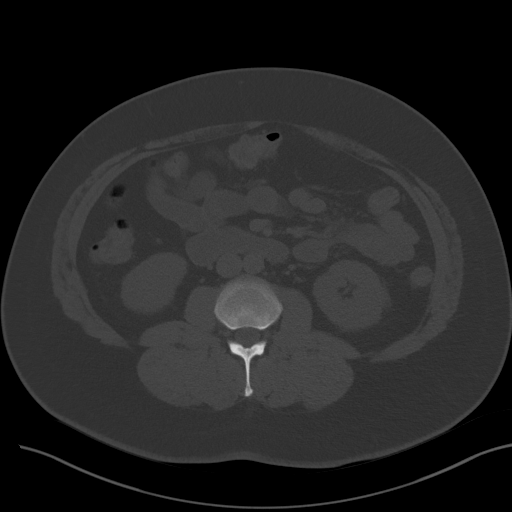
[im 69/105  soft-tissue]
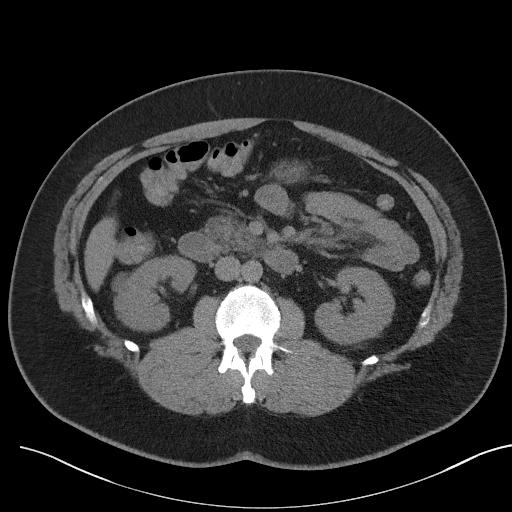
[im 77/105  soft-tissue]
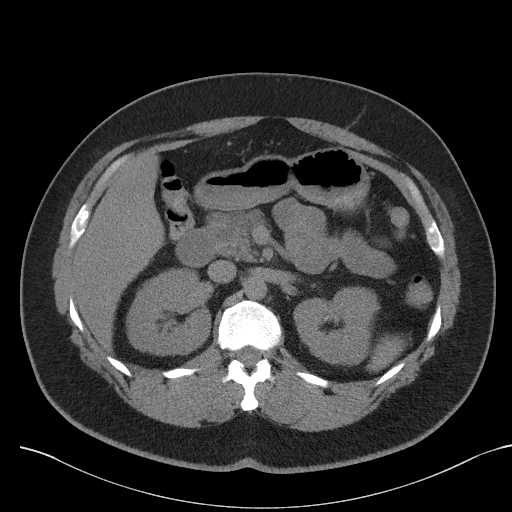
[im 85/105  soft-tissue]
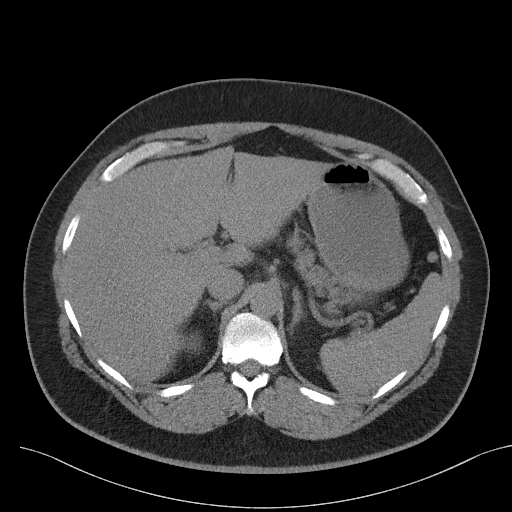
[im 93/105  soft-tissue]
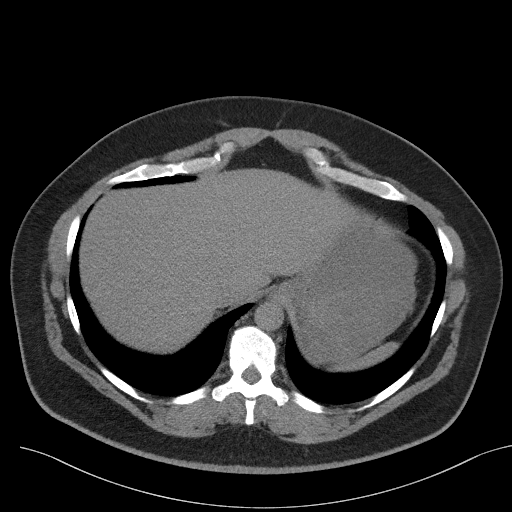
[im 101/105  soft-tissue]
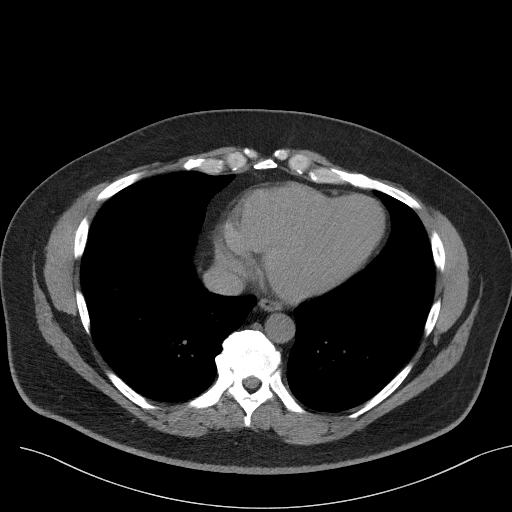

[Series 4: coronal st · coronal · 0.95mm/px · 3 of 96 slices shown]
[im 32/96  soft-tissue]
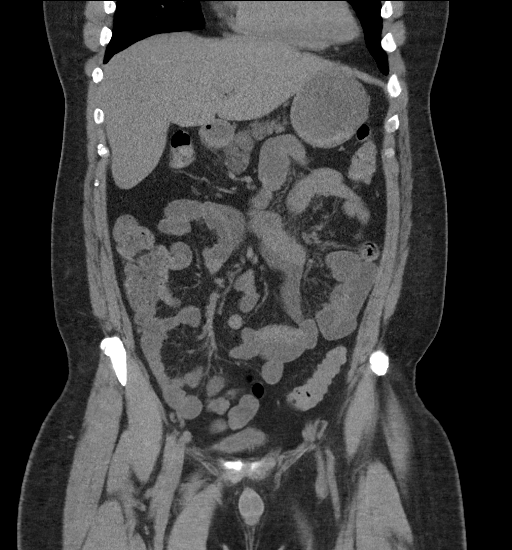
[im 43/96  soft-tissue]
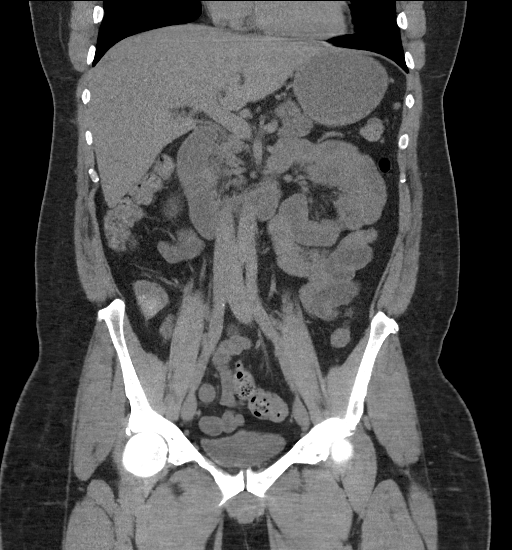
[im 53/96  soft-tissue]
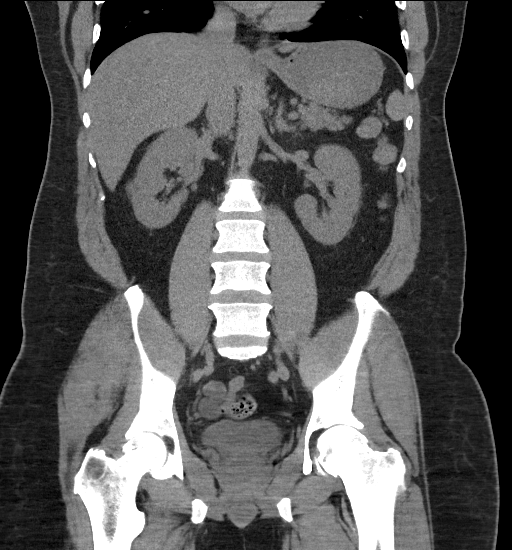

[17 of 46 positions shown; findings below may reference images not displayed]

FINDINGS: Lower chest: Lung bases are clear. No effusions. Heart is normal
size.

Hepatobiliary: Prior cholecystectomy. Subtle micro nodularity to the
liver surface suggests the possibility of cirrhosis. Recommend
clinical correlation. No focal hepatic abnormality.

Pancreas: No focal abnormality or ductal dilatation.

Spleen: No focal abnormality.  Normal size.

Adrenals/Urinary Tract: No renal or ureteral stones. No
hydronephrosis. Small exophytic cyst off the midpole of the right
kidney. Adrenal glands and urinary bladder unremarkable.

Stomach/Bowel: Normal appendix. Stomach, large and small bowel
grossly unremarkable.

Vascular/Lymphatic: No evidence of aneurysm or adenopathy.

Reproductive: No visible focal abnormality.

Other: No free fluid or free air.

Musculoskeletal: No acute bony abnormality.
IMPRESSION: No renal or ureteral stones.  No hydronephrosis.

Subtle nodular appearance of the liver surface suggests the
possibility of cirrhosis. Recommend clinical correlation.

No acute findings in the abdomen or pelvis.
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
## 2021-01-05 ENCOUNTER — Encounter: Payer: Self-pay | Admitting: *Deleted

## 2021-01-06 ENCOUNTER — Ambulatory Visit: Payer: Medicare Other | Admitting: Family Medicine

## 2021-01-08 ENCOUNTER — Ambulatory Visit: Payer: Medicare Other | Admitting: Internal Medicine

## 2021-01-09 ENCOUNTER — Other Ambulatory Visit: Payer: Self-pay | Admitting: Internal Medicine

## 2021-01-09 DIAGNOSIS — K2100 Gastro-esophageal reflux disease with esophagitis, without bleeding: Secondary | ICD-10-CM

## 2021-01-15 DIAGNOSIS — R948 Abnormal results of function studies of other organs and systems: Secondary | ICD-10-CM | POA: Diagnosis not present

## 2021-01-15 DIAGNOSIS — E291 Testicular hypofunction: Secondary | ICD-10-CM | POA: Diagnosis not present

## 2021-01-18 ENCOUNTER — Other Ambulatory Visit: Payer: Self-pay | Admitting: Urology

## 2021-01-22 DIAGNOSIS — N400 Enlarged prostate without lower urinary tract symptoms: Secondary | ICD-10-CM | POA: Diagnosis not present

## 2021-01-22 DIAGNOSIS — E291 Testicular hypofunction: Secondary | ICD-10-CM | POA: Diagnosis not present

## 2021-01-28 ENCOUNTER — Telehealth: Payer: Self-pay | Admitting: Pharmacist

## 2021-01-28 NOTE — Progress Notes (Signed)
Chronic Care Management Pharmacy Assistant   Name: [name]  MRN: [id number] DOB: 07-Mar-1971  Reason for Encounter: Disease State   Conditions to be addressed/monitored: DMII   Recent office visits:  12/05/20 [name], MD (PCP) I have discontinued Lona Millard. [name]'s amoxicillin-clavulanate and amoxicillin-clavulanate. I have also changed his ezetimibe. Additionally, I am having him start on amoxicillin-clavulanate.   Recent consult visits:  None ID  Hospital visits:  None in previous 6 months  Medications: Outpatient Encounter Medications as of 01/28/2021  Medication Sig   albuterol (VENTOLIN HFA) 108 (90 Base) MCG/ACT inhaler Inhale 2 puffs into the lungs every 6 (six) hours as needed for wheezing or shortness of breath.   allopurinol (ZYLOPRIM) 300 MG tablet TAKE 1 TABLET(300 MG) BY MOUTH DAILY   amoxicillin-clavulanate (AUGMENTIN) 875-125 MG tablet Take 1 tablet by mouth 2 (two) times daily.   aspirin EC 81 MG tablet Take 1 tablet (81 mg total) by mouth daily.   cholecalciferol (VITAMIN D) 1000 units tablet Take 1,000 Units by mouth daily.   clomiPHENE (CLOMID) 50 MG tablet Take 50 mg by mouth every other day.   colchicine 0.6 MG tablet Take 0.6 mg by mouth every 4 (four) hours as needed (gout pain).   docusate sodium (COLACE) 100 MG capsule Take 100 mg by mouth daily.   Empagliflozin-metFORMIN HCl ER (SYNJARDY XR) 12.10-998 MG TB24 Take 2 tablets by mouth daily.   ezetimibe (ZETIA) 10 MG tablet Take 1 tablet (10 mg total) by mouth daily.   fluticasone (FLONASE) 50 MCG/ACT nasal spray Place 2 sprays into both nostrils daily.   folic acid (FOLVITE) 1 MG tablet Take 1 mg by mouth daily.   glucose blood (ONETOUCH VERIO) test strip 1 each by Other route daily. And lancets 1/day (Patient taking differently: 1 each by Other route daily.)   metoprolol succinate (TOPROL-XL) 50 MG 24 hr tablet Take 50 mg by mouth daily. Take with or immediately following a meal.    nateglinide (STARLIX) 120 MG tablet Take 1 tablet (120 mg total) by mouth 3 (three) times daily with meals.   nitroGLYCERIN (NITROSTAT) 0.4 MG SL tablet Place 1 tablet (0.4 mg total) under the tongue every 5 (five) minutes as needed for chest pain.   pantoprazole (PROTONIX) 40 MG tablet TAKE 2 TABLETS(80 MG) BY MOUTH DAILY   rosuvastatin (CRESTOR) 40 MG tablet TAKE 1 TABLET(40 MG) BY MOUTH DAILY   sacubitril-valsartan (ENTRESTO) 49-51 MG Take 1 tablet by mouth 2 (two) times daily.   saw palmetto 500 MG capsule Take 500 mg by mouth daily.   tadalafil (CIALIS) 20 MG tablet TAKE ONE TABLET BY MOUTH DAILY AS NEEDED   tamsulosin (FLOMAX) 0.4 MG CAPS capsule Take 1 capsule (0.4 mg total) by mouth daily.   tiZANidine (ZANAFLEX) 4 MG tablet Take 1 tablet (4 mg total) by mouth every 8 (eight) hours as needed for muscle spasms.   Zinc Sulfate (ZINC 15 PO) Take 1 tablet by mouth daily.   No facility-administered encounter medications on file as of 01/28/2021.     Recent Relevant Labs: Lab Results  Component Value Date/Time   HGBA1C 7.9 (H) 12/05/2020 03:02 PM   HGBA1C 7.4 (A) 09/30/2020 01:08 PM   HGBA1C 7.5 (A) 01/10/2020 03:56 PM   HGBA1C 6.8 (H) 08/29/2019 08:47 AM   MICROALBUR 4.7 (H) 12/05/2020 03:02 PM   MICROALBUR 1.0 08/29/2019 08:47 AM    Kidney Function Lab Results  Component Value Date/Time   CREATININE 0.89 12/05/2020  03:02 PM   CREATININE 0.89 07/05/2020 09:17 AM   CREATININE 0.91 04/07/2016 03:29 PM   GFR 101.70 12/05/2020 03:02 PM   GFRNONAA >60 02/11/2020 07:54 PM   GFRAA >60 02/11/2020 07:54 PM     Contacted patient on 01/28/21 to discuss diabetes disease state.   Current antihyperglycemic regimen:  Synjardy 12.5-1000mg  2 tabs daily   Patient verbally confirms he is taking the above medications as directed. Yes  What diet changes have been made to improve diabetes control?  What recent interventions/DTPs have been made to improve glycemic control:  He will continue  to work on his lifestyle modifications and will continue the current glycemic agents.  Have there been any recent hospitalizations or ED visits since last visit with CPP? No  Patient denies hypoglycemic symptoms, including None  Patient denies hyperglycemic symptoms, including none  How often are you checking your blood sugar? twice daily  What are your blood sugars ranging? 121-136 Fasting: NA Before meals: 134 After meals: NA Bedtime: 132  During the week, how often does your blood glucose drop below 70? Never  Are you checking your feet daily/regularly? Yes, patient states not issues with feet  Adherence Review: Is the patient currently on a STATIN medication? Yes Is the patient currently on ACE/ARB medication? Yes Does the patient have >5 day gap between last estimated fill dates? No  Care Gaps: Last annual wellness visit:12/05/20 Last eye exam / retinopathy screening:11/05/20 Last diabetic foot exam:09/30/20  Star Rating Drugs:  Medication:  Last Fill: Day Supply Rosuvastatin 40 mg 12/03/20 90 Synjardy 12.10-998 mg 11/12/20 14  Entresto 49-51 mg 01/28/21  30 (Patient would like to see if he can get patient assistance. He said the cost is to high. He paid $156 this month)  CCM appointment on 02/13/21    [name] Clinical Pharmacist Assistant [phone]   Time spent:29

## 2021-02-04 NOTE — Progress Notes (Signed)
    Chronic Care Management Pharmacy Assistant   Name: [name]  MRN: [id number] DOB: [birth year]  Made a call to [employer] this morning to check on the refills for [facility] for patient. The representative [name] stated that the patient has been approved through 06/07/21. She stated that the patient received a refill on 11/29/20 for a 30 ds. She said that the patient has to call [phone] for his refills since he gets them every 30 days. If patient does not have prescription number he needs to select opt #1 3x and give phone #, DOB, and zip code. Called the patient to let him know what to do for refills with [employer], unable to reach patient, left message for patient to return call   [name] Clinical Pharmacist Assistant [phone]   Time spent:15

## 2021-02-05 NOTE — Telephone Encounter (Signed)
Contacted prescriber physician (Dr Sharyn Lull) to send new 90-day supply of Entresto to to [facility] pt assistance pharmacy:   [facility] [employer], [place] - [address] [place] [address] Phone: [phone] Fax: [phone]

## 2021-02-06 ENCOUNTER — Telehealth: Payer: Medicare Other

## 2021-02-12 NOTE — Progress Notes (Signed)
Chronic Care Management Pharmacy Note  02/13/2021 Name:  [name] MRN:  [id number] DOB:  [birth year]  Summary: -Pt was approved for [facility] Nigh pt assistance -Pt reports fasting BG 97-120 -Pt requested refill for albuterol - he endorses some coughing that always occurs around this time of year. He is also taking OTC Zyrtec  Recommendations/Changes made from today's visit: -Refilled albuterol per pt request. Advised to contact PCP if symptoms do not improve -Advised to get vaccines - COVID booster, influenza  Subjective: [name] is an 50 y.o. year old male who is a primary patient of Janith Lima, MD.  The CCM team was consulted for assistance with disease management and care coordination needs.    Engaged with patient by telephone for follow up visit in response to provider referral for pharmacy case management and/or care coordination services.   Consent to Services:  The patient was given information about Chronic Care Management services, agreed to services, and gave verbal consent prior to initiation of services.  Please see initial visit note for detailed documentation.   Patient Care Team: Janith Lima, MD as PCP - General (Internal Medicine) Dorothy Spark, MD (Inactive) as PCP - Cardiology (Cardiology) Kittie Plater as Consulting Physician (Rheumatology) Mosetta Anis, MD as Referring Physician (Rheumatology) Chesley Mires, MD as Consulting Physician (Pulmonary Disease) Renato Shin, MD as Consulting Physician (Endocrinology) Dorothy Spark, MD (Inactive) (Cardiology) Charlton Haws, [facility] as Pharmacist (Pharmacist) Webb Laws, OD as Referring Physician (Optometry)  Recent office visits: 12/05/20 Dr Ronnald Ramp OV: annual exam; c/o urinary frequency, dysuria. Urine cx + staph infection, likely contaminant. A1c up to 7.9%.  07/05/20 NP Jodi Mourning OV: LUQ pain. CT scan possible mild acute pancreatitis, rec'd MRI of pancreas.  Enzymes normal. MRI results also normal - referred to GI.  03/21/20 Dr Ronnald Ramp OV: c/o urinary frequency, no evidence of infection  01/10/20 Dr Ronnald Ramp OV: ED f/u. A1c up to 7.5%, rec'd lifestyle modifications. IBS not improved with Bentyl. Try Trulance.   09/26/19 Dr Ronnald Ramp OV: EF < 50% rec'd ECHO. EF 55-60%, no major concerns.   08/29/19 Dr Ronnald Ramp OV: chronic f/u, conditions stable. Ordered myocardial perfusion scan which showed EF < 50% but no obstructive disease.  Recent consult visits: 09/30/20 Dr Loanne Drilling (endocrine): f/u DM. A1c 7.4%. Switched repaglinide to nateglinide. Continue Synjardy. F/U 2 months.  09/03/20 Dr Fuller Plan (GI): f/u LUQ abd pain/GERD, improved on pantoprazole. Schedule EGD/colonoscopy. Constipation - start Miralax and colace daily.   01/25/20 Dr Chase Caller (Pulmonary): sarcoid flare, re-establishing care (last 3 years ago). Flare may be sarcoid, obesity, diastolic dysfunction, or asthma. Ordered CBC, HRCT, PFT, and 3rd Covid booster.   06/20/19 PA Lenze (cardiology): No chest pain, encouraged exercise. Had palpitations in June 2020, holding off on restarting a BB due to controlled BP and no recent palpitations  Hospital visits: None in previous 6 months   02/11/20 ED visit: nausea/vomiting. Blood work, CT scan reassuring. Treated for UTI with Keflex.   01/09/20 ED visit: RLQ pain, ruled out gastroenteritis, appendicitis, treated symptomatically with morphine and zofran. Discharged with Bentyl.    Objective:  Lab Results  Component Value Date   CREATININE 0.89 12/05/2020   BUN 12 12/05/2020   GFR 101.70 12/05/2020   GFRNONAA >60 02/11/2020   GFRAA >60 02/11/2020   NA 142 12/05/2020   K 3.8 12/05/2020   CALCIUM 9.2 12/05/2020   CO2 27 12/05/2020    Lab Results  Component Value Date/Time   HGBA1C 7.9 (  H) 12/05/2020 03:02 PM   HGBA1C 7.4 (A) 09/30/2020 01:08 PM   HGBA1C 7.5 (A) 01/10/2020 03:56 PM   HGBA1C 6.8 (H) 08/29/2019 08:47 AM   GFR 101.70 12/05/2020 03:02 PM    GFR 102.00 07/05/2020 09:17 AM   MICROALBUR 4.7 (H) 12/05/2020 03:02 PM   MICROALBUR 1.0 08/29/2019 08:47 AM    Last diabetic Eye exam:  Lab Results  Component Value Date/Time   HMDIABEYEEXA No Retinopathy 11/05/2020 12:00 AM    Last diabetic Foot exam:  Lab Results  Component Value Date/Time   HMDIABFOOTEX Done 04/10/2010 12:00 AM    MRI Pancreas 07/14/20: IMPRESSION: 1. No MR evidence of pancreatic mass or other acute findings in the abdomen. No pancreatic ductal dilatation. 2. Mild hepatic steatosis. 3. Status post cholecystectomy.  Lab Results  Component Value Date   CHOL 98 12/05/2020   HDL 32.20 (L) 12/05/2020   LDLCALC 42 12/05/2020   TRIG 115.0 12/05/2020   CHOLHDL 3 12/05/2020    Hepatic Function Latest Ref Rng & Units 07/05/2020 01/09/2020 08/29/2019  Total Protein 6.0 - 8.3 g/dL 8.1 7.6 7.5  Albumin 3.5 - 5.2 g/dL 4.5 4.0 4.2  AST 0 - 37 U/L $Remo'17 18 18  'JUNTV$ ALT 0 - 53 U/L $Remo'22 19 19  'ccNJs$ Alk Phosphatase 39 - 117 U/L 89 77 84  Total Bilirubin 0.2 - 1.2 mg/dL 0.4 0.3 0.4  Bilirubin, Direct 0.0 - 0.3 mg/dL - - 0.1    Lab Results  Component Value Date/Time   TSH 0.62 06/14/2018 03:56 PM   TSH 0.66 03/01/2017 12:05 PM   FREET4 0.7 11/13/2008 11:12 AM    CBC Latest Ref Rng & Units 07/05/2020 02/11/2020 01/09/2020  WBC 4.0 - 10.5 K/uL 6.1 8.5 6.5  Hemoglobin 13.0 - 17.0 g/dL 14.4 15.2 14.3  Hematocrit 39.0 - 52.0 % 42.7 45.0 41.7  Platelets 150.0 - 400.0 K/uL 269.0 249 241    No results found for: VD25OH  Clinical ASCVD: No  The ASCVD Risk score (Arnett DK, et al., 2019) failed to calculate for the following reasons:   The valid total cholesterol range is 130 to 320 mg/dL    Depression screen [facility] 2/9 07/22/2020 08/29/2019 06/14/2018  Decreased Interest 0 0 0  Down, Depressed, Hopeless 0 0 0  PHQ - 2 Score 0 0 0  Altered sleeping - 1 -  Tired, decreased energy - 0 -  Change in appetite - 0 -  Feeling bad or failure about yourself  - 0 -  Trouble concentrating - 0 -   Moving slowly or fidgety/restless - 0 -  Suicidal thoughts - 0 -  PHQ-9 Score - 1 -  Difficult doing work/chores - Not difficult at all -  Some recent data might be hidden     Social History   Tobacco Use  Smoking Status Former   Packs/day: 1.00   Years: 10.00   Pack years: 10.00   Types: Cigarettes   Quit date: 05/18/2009   Years since quitting: 11.7  Smokeless Tobacco Never   BP Readings from Last 3 Encounters:  12/05/20 126/74  11/21/20 (!) 158/98  11/18/20 126/80   Pulse Readings from Last 3 Encounters:  12/05/20 66  11/21/20 83  11/18/20 75   Wt Readings from Last 3 Encounters:  12/05/20 247 lb (112 kg)  11/21/20 273 lb 6.4 oz (124 kg)  11/18/20 273 lb 3.2 oz (123.9 kg)    Assessment/Interventions: Review of patient past medical history, allergies, medications, health status, including review of  consultants reports, laboratory and other test data, was performed as part of comprehensive evaluation and provision of chronic care management services.   SDOH:  (Social Determinants of Health) assessments and interventions performed: Yes   CCM Care Plan  Allergies  Allergen Reactions   Amlodipine Other (See Comments)    Ankle swelling with $RemoveBefor'10mg'QYIdduEKbMxL$ , ok with $Remov'5mg'nPrGLE$  dose   Sulfamethoxazole Rash    fever   Sulfonamide Derivatives Rash and Other (See Comments)    Medications Reviewed Today     Reviewed by Charlton Haws, [facility] (Pharmacist) on 02/13/21 at 0925  Med List Status: <None>   Medication Order Taking? Sig Documenting Provider Last Dose Status Informant  albuterol (VENTOLIN HFA) 108 (90 Base) MCG/ACT inhaler [id number] Yes Inhale 2 puffs into the lungs every 6 (six) hours as needed for wheezing or shortness of breath. Janith Lima, MD Taking Active   allopurinol (ZYLOPRIM) 300 MG tablet [id number] Yes TAKE 1 TABLET(300 MG) BY MOUTH DAILY Janith Lima, MD Taking Active   aspirin EC 81 MG tablet [id number] Yes Take 1 tablet (81 mg total) by mouth daily.  Rowe Clack, MD Taking Active Self  cetirizine (ZYRTEC) 10 MG tablet [id number] Yes Take 10 mg by mouth daily. [provider] Taking Active   cholecalciferol (VITAMIN D) 1000 units tablet [id number] Yes Take 1,000 Units by mouth daily. [provider] Taking Active Self  clomiPHENE (CLOMID) 50 MG tablet [id number] Yes Take 50 mg by mouth every other day. [provider] Taking Active   colchicine 0.6 MG tablet [id number] Yes Take 0.6 mg by mouth every 4 (four) hours as needed (gout pain). [provider] Taking Active Self           Med Note Lenord Fellers, Cleaster Corin   Tue Aug 29, 2019 11:32 AM)    docusate sodium (COLACE) 100 MG capsule [id number] Yes Take 100 mg by mouth daily. [provider] Taking Active   Empagliflozin-metFORMIN HCl ER (SYNJARDY XR) 12.10-998 MG TB24 [id number] Yes Take 2 tablets by mouth daily. Renato Shin, MD Taking Active            Med Note Rolan Bucco Feb 13, 2021  9:22 AM) Via [employer] pt assistance  ezetimibe (ZETIA) 10 MG tablet [id number] Yes Take 1 tablet (10 mg total) by mouth daily. Janith Lima, MD Taking Active   fluticasone [facility]) 50 MCG/ACT nasal spray [id number] Yes Place 2 sprays into both nostrils daily. Marrian Salvage, FNP Taking Active   folic acid (FOLVITE) 1 MG tablet [id number] Yes Take 1 mg by mouth daily. [provider] Taking Active Self           Med Note Earley Brooke Jun 28, 2017  8:49 AM)    glucose blood [facility] VERIO) test strip [id number] Yes 1 each by Other route daily. And lancets 1/day  Patient taking differently: 1 each by Other route daily.   Renato Shin, MD Taking Active   metoprolol succinate (TOPROL-XL) 50 MG 24 hr tablet [id number] Yes Take 50 mg by mouth daily. Take with or immediately following a meal. Charolette Forward, MD Taking Active   nateglinide (STARLIX) 120 MG tablet [id number] Yes Take 1 tablet (120 mg total) by mouth 3 (three) times  daily with meals. Renato Shin, MD Taking Active   nitroGLYCERIN (NITROSTAT) 0.4 MG SL tablet [id number] Yes Place 1 tablet (0.4 mg total) under the tongue every 5 (five) minutes as needed for  chest pain. Lyda Jester M, PA-C Taking Active Self  pantoprazole (PROTONIX) 40 MG tablet [id number] Yes TAKE 2 TABLETS(80 MG) BY MOUTH DAILY Janith Lima, MD Taking Active   rosuvastatin (CRESTOR) 40 MG tablet [id number] Yes TAKE 1 TABLET(40 MG) BY MOUTH DAILY Janith Lima, MD Taking Active   sacubitril-valsartan (ENTRESTO) 49-51 MG [id number] Yes Take 1 tablet by mouth 2 (two) times daily. Via NOVARTIS pt assistance Charolette Forward, MD Taking Active   saw palmetto 500 MG capsule [id number] Yes Take 500 mg by mouth daily. [provider] Taking Active   tadalafil (CIALIS) 20 MG tablet [id number] Yes TAKE ONE TABLET BY MOUTH DAILY AS NEEDED McKenzie, Candee Furbish, MD Taking Active   tamsulosin [facility]) 0.4 MG CAPS capsule [id number] Yes Take 1 capsule (0.4 mg total) by mouth daily. Binnie Rail, MD Taking Active   tiZANidine (ZANAFLEX) 4 MG tablet [id number] Yes Take 1 tablet (4 mg total) by mouth every 8 (eight) hours as needed for muscle spasms. Gregor Hams, MD Taking Active   Zinc Sulfate (ZINC 15 PO) [id number] Yes Take 1 tablet by mouth daily. [provider] Taking Active             Patient Active Problem List   Diagnosis Date Noted   Dysuria 12/05/2020   Chronic bilateral low back pain with left-sided sciatica 11/21/2020   Urinary frequency 03/21/2020   Irritable bowel syndrome with constipation 01/10/2020   Ejection fraction < 50% 09/26/2019   LVH (left ventricular hypertrophy) due to hypertensive disease, with heart failure ([place]) 09/26/2019   Routine general medical examination at a health care facility 06/14/2018   Acute bacterial prostatitis 11/17/2017   Hepatic steatosis 11/03/2017   Primary narcolepsy without cataplexy 06/22/2017   Periodic limb movements of  sleep 06/22/2017   Hypersomnia 06/22/2017   Nonintractable epilepsy with complex partial seizures ([place]) 03/23/2017   Drug-induced erectile dysfunction 11/06/2015   Body mass index (BMI) of 35.0 to 35.9 with comorbidity    Sarcoidosis ([place]) 09/29/2010   GERD 01/07/2010   Exercise-induced asthma 12/03/2009   POLYNEUROPATHY 10/17/2009   Male hypogonadism 10/08/2009   Diabetes mellitus type 2 in obese ([place]) 10/07/2009   Hyperlipidemia with target LDL less than 70 10/07/2009   Gout 10/07/2009   Essential hypertension 10/07/2009   CAD (coronary artery disease) 10/07/2009   Male erectile dysfunction 10/07/2009    Immunization History  Administered Date(s) Administered   Influenza Split 04/06/2011, 02/17/2012, 03/16/2012   Influenza,inj,Quad PF,6+ Mos 03/21/2013, 03/08/2014, 04/03/2015, 02/19/2016, 02/17/2017, 06/14/2018, 02/20/2019, 03/21/2020   Moderna Sars-Covid-2 Vaccination 07/31/2019, 09/05/2019, 04/19/2020   Pneumococcal Conjugate-13 04/29/2015   Pneumococcal Polysaccharide-23 11/06/2015   Tdap 03/21/2013    Conditions to be addressed/monitored:  Hypertension, Hyperlipidemia, Diabetes, Heart Failure and Coronary Artery Disease  Care Plan : [place]  Updates made by Charlton Haws, [place] since 02/13/2021 12:00 AM     Problem: Hypertension, Hyperlipidemia, Diabetes, Heart Failure and Coronary Artery Disease   Priority: High     Long-Range Goal: Disease management   Start Date: 08/06/2020  Expected End Date: 02/06/2021  This Visit's Progress: On track  Recent Progress: On track  Priority: High  Note:   Current Barriers:  Unable to independently monitor therapeutic efficacy  Pharmacist Clinical Goal(s):  Patient will achieve adherence to monitoring guidelines and medication adherence to achieve therapeutic efficacy  Interventions: 1:1 collaboration with Janith Lima, MD regarding development and update of comprehensive plan of care as evidenced by  provider attestation  and co-signature Inter-disciplinary care team collaboration (see longitudinal plan of care) Comprehensive medication review performed; medication list updated in electronic medical record  Hypertension / Heart failure (BP goal < 130/80) Controlled - pt reports vast improvement in BP since adjustment to BP meds by Dr Terrence Dupont; home BP 110s/70s; pt was approved for [facility] pt assistance and reports refill is being shipped Current regimen:  Metoprolol succinate 50 mg daily Entresto 49-51 mg BID Interventions: Discussed BP goals and benefits of medications for prevention of heart attack / stroke Patient has regular follow up with cardiologist Recommend to continue current medications   Hyperlipidemia / CAD (LDL goal < 70)   CAD: 09/2019 nuclear stress test low risk study. Controlled  - LDL is at goal; pt endorses compliance with statin and denies issues Current regimen:  rosuvastatin 40 mg daily,  ezetimibe 10 mg daily,  nitroglycerin 0.4 mg as needed aspirin 81 mg daily    Interventions: Discussed cholesterol goals and benefits of medications for prevention of heart attack / stroke Recommend to continue current medication   Diabetes (A1c goal < 7%) Not ideally controlled - last A1c 7.9%; pt follows with Dr Loanne Drilling; Iva Boop was recently approved through pt assistance ([employer]) and pt endorses compliance -Fasting BG 97-110 Current regimen:  Synjardy XR 12.10-998 mg - 2 tablets daily  Nateglinidine 120 mg TID w/ meals Past tried/failed meds: Invokana, Farxiga, [name], pioglitazone (edema) Interventions: Discussed blood sugar goals and benefits of medications for prevention of diabetic complications Discussed history of pancreatitis (09/2017) limits use of other classes of diabetes medications (GLP-1 and DPP-IV) Discussed ways to improve diet - switch to diet sodas/juices Recommend to continue current medication  Allergic rhinitis / Asthma (Goal: reduce  symptoms) -Not ideally controlled - pt reports increased coughing/shortness of breath lately, he reports this happens every year around this time; he requests refill for albuterol inhaler -Current treatment  Cetirizine 10 mg daily Albuterol HFA prn - expired rx Fluticasone nasal spray PRN -Counseled on proper inhaler use; seasonal nature of symptoms point to allergic cause;  -Refilled albuterol. Advised patient to contact PCP if symptoms do not improve  Patient Goals/Self-Care Activities Patient will:  - take medications as prescribed -check glucose daily, document, and provide at future appointments -check blood pressure daily, document, and provide at future appointments -weigh daily, and contact provider if weight gain of 2+ lbs overnight or 5+ lbs in a week -collaborate with providers on medication access solutions Iva Boop, [place]) -Use Albuterol inhaler as needed; contact PCP if symptoms do not improve       Medication Assistance:  [employer] ([employer]) - approved through 06/07/21 Delene Loll [employer]) - approved though 06/07/21  Compliance/Adherence/Medication fill history: Care Gaps: Vaccines - Covid booster, influenza --advised patient to get vaccines at local pharmacy  Star-Rating Drugs: Synjardy - LF 11/04/20 x 90 ds Rosuvastatin - LF 12/03/20 x 90 ds Nateglinide - LF 10/09/20 x 90 ds Entresto - LF 07/26/20 x 90 ds  Patient's preferred pharmacy is:  [id number] [address], [place] [place] [address] Phone: [phone] Fax: [phone]  [facility] [id number], [place] [address] Phone: [phone] Fax: [phone]  Uses pill box? Yes Pt endorses 100% compliance  We discussed: Current pharmacy is preferred with insurance plan and patient is satisfied with pharmacy services Patient decided to:  Continue current  medication management strategy  Care Plan and Follow Up Patient Decision:  Patient agrees to Care Plan and Follow-up.  Plan: Telephone follow up appointment with care management team member scheduled for:  3 months  Charlene Brooke, PharmD, [place], CPP Clinical Pharmacist [facility] [phone]

## 2021-02-13 ENCOUNTER — Ambulatory Visit (INDEPENDENT_AMBULATORY_CARE_PROVIDER_SITE_OTHER): Payer: Medicare Other | Admitting: Pharmacist

## 2021-02-13 ENCOUNTER — Other Ambulatory Visit: Payer: Self-pay

## 2021-02-13 DIAGNOSIS — E669 Obesity, unspecified: Secondary | ICD-10-CM

## 2021-02-13 DIAGNOSIS — I25118 Atherosclerotic heart disease of native coronary artery with other forms of angina pectoris: Secondary | ICD-10-CM

## 2021-02-13 DIAGNOSIS — E1169 Type 2 diabetes mellitus with other specified complication: Secondary | ICD-10-CM

## 2021-02-13 DIAGNOSIS — E785 Hyperlipidemia, unspecified: Secondary | ICD-10-CM

## 2021-02-13 DIAGNOSIS — I110 Hypertensive heart disease with heart failure: Secondary | ICD-10-CM

## 2021-02-13 DIAGNOSIS — I10 Essential (primary) hypertension: Secondary | ICD-10-CM

## 2021-02-13 DIAGNOSIS — J45990 Exercise induced bronchospasm: Secondary | ICD-10-CM

## 2021-02-13 MED ORDER — ALBUTEROL SULFATE HFA 108 (90 BASE) MCG/ACT IN AERS: 2.0000 | INHALATION_SPRAY | Freq: Four times a day (QID) | RESPIRATORY_TRACT | 3 refills | Status: DC | PRN

## 2021-02-13 NOTE — Patient Instructions (Signed)
Visit Information  Phone number for Pharmacist: [phone]   Goals Addressed             This Visit's Progress    Manage My Medicine       Timeframe:  Long-Range Goal Priority:  Medium Start Date:    11/13/20                         Expected End Date:   05/15/21                    Follow Up Date Dec 2022   - call for medicine refill 2 or 3 days before it runs out - call if I am sick and can't take my medicine - keep a list of all the medicines I take; vitamins and herbals too - Collaborate with providers for Lenord Carbo patient assistance  - Use Albuterol inhaler as needed; contact PCP if symptoms do not improve   Why is this important?   These steps will help you keep on track with your medicines.   Notes:         Care Plan : CCM Pharmacy Care Plan  Updates made by Kathyrn Sheriff, RPH since 02/13/2021 12:00 AM     Problem: Hypertension, Hyperlipidemia, Diabetes, Heart Failure and Coronary Artery Disease   Priority: High     Long-Range Goal: Disease management   Start Date: 08/06/2020  Expected End Date: 02/06/2021  This Visit's Progress: On track  Recent Progress: On track  Priority: High  Note:   Current Barriers:  Unable to independently monitor therapeutic efficacy  Pharmacist Clinical Goal(s):  Patient will achieve adherence to monitoring guidelines and medication adherence to achieve therapeutic efficacy  Interventions: 1:1 collaboration with [name], MD regarding development and update of comprehensive plan of care as evidenced by provider attestation and co-signature Inter-disciplinary care team collaboration (see longitudinal plan of care) Comprehensive medication review performed; medication list updated in electronic medical record  Hypertension / Heart failure (BP goal < 130/80) Controlled - pt reports vast improvement in BP since adjustment to BP meds by Dr Sharyn Lull; home BP 110s/70s; pt was approved for [facility] pt assistance and  reports refill is being shipped Current regimen:  Metoprolol succinate 50 mg daily Entresto 49-51 mg BID Interventions: Discussed BP goals and benefits of medications for prevention of heart attack / stroke Patient has regular follow up with cardiologist Recommend to continue current medications   Hyperlipidemia / CAD (LDL goal < 70)   CAD: 09/2019 nuclear stress test low risk study. Controlled  - LDL is at goal; pt endorses compliance with statin and denies issues Current regimen:  rosuvastatin 40 mg daily,  ezetimibe 10 mg daily,  nitroglycerin 0.4 mg as needed aspirin 81 mg daily    Interventions: Discussed cholesterol goals and benefits of medications for prevention of heart attack / stroke Recommend to continue current medication   Diabetes (A1c goal < 7%) Not ideally controlled - last A1c 7.9%; pt follows with Dr Everardo All; Kirk Ruths was recently approved through pt assistance ([employer]) and pt endorses compliance -Fasting BG 97-110 Current regimen:  Synjardy XR 12.10-998 mg - 2 tablets daily  Nateglinidine 120 mg TID w/ meals Past tried/failed meds: Invokana, Farxiga, [name], pioglitazone (edema) Interventions: Discussed blood sugar goals and benefits of medications for prevention of diabetic complications Discussed history of pancreatitis (09/2017) limits use of other classes of diabetes medications (GLP-1 and DPP-IV) Discussed ways to improve  diet - switch to diet sodas/juices Recommend to continue current medication  Allergic rhinitis / Asthma (Goal: reduce symptoms) -Not ideally controlled - pt reports increased coughing/shortness of breath lately, he reports this happens every year around this time; he requests refill for albuterol inhaler -Current treatment  Cetirizine 10 mg daily Albuterol HFA prn - expired rx Fluticasone nasal spray PRN -Counseled on proper inhaler use; seasonal nature of symptoms point to allergic cause;  -Refilled albuterol. Advised patient to  contact PCP if symptoms do not improve  Patient Goals/Self-Care Activities Patient will:  - take medications as prescribed -check glucose daily, document, and provide at future appointments -check blood pressure daily, document, and provide at future appointments -weigh daily, and contact provider if weight gain of 2+ lbs overnight or 5+ lbs in a week -collaborate with providers on medication access solutions Kirk Ruths, [place]) -Use Albuterol inhaler as needed; contact PCP if symptoms do not improve       Patient verbalizes understanding of instructions provided today and agrees to view in MyChart.  Telephone follow up appointment with pharmacy team member scheduled for: 3 months  Al Corpus, PharmD, [address], CPP Clinical Pharmacist [facility] [phone]

## 2021-02-18 ENCOUNTER — Other Ambulatory Visit: Payer: Self-pay

## 2021-02-18 ENCOUNTER — Ambulatory Visit (INDEPENDENT_AMBULATORY_CARE_PROVIDER_SITE_OTHER): Payer: Medicare Other | Admitting: Endocrinology

## 2021-02-18 VITALS — BP 120/70 | HR 74 | Ht 75.0 in | Wt 269.6 lb

## 2021-02-18 DIAGNOSIS — E669 Obesity, unspecified: Secondary | ICD-10-CM

## 2021-02-18 DIAGNOSIS — E1169 Type 2 diabetes mellitus with other specified complication: Secondary | ICD-10-CM

## 2021-02-18 LAB — POCT GLYCOSYLATED HEMOGLOBIN (HGB A1C): Hemoglobin A1C: 7.4 % — AB (ref 4.0–5.6)

## 2021-02-18 MED ORDER — COLESEVELAM HCL 625 MG PO TABS: 1250.0000 mg | ORAL_TABLET | Freq: Two times a day (BID) | ORAL | 11 refills | Status: DC

## 2021-02-18 NOTE — Progress Notes (Signed)
Subjective:    Patient ID: [name], male    DOB: Jul 03, 1970, 50 y.o.   MRN: [id number]  HPI Pt returns for f/u of diabetes mellitus:  DM type: 2 Dx'ed: 2008 Complications: CAD Therapy: Synjardy.     DKA: never Severe hypoglycemia: never.  Pancreatitis: 2019 (GB) Pancreatic imaging: 2011 CT: mild fatty replacement in the head and uncinate process.   Other: he has never been on insulin; He stopped pioglitazone, due to weight gain.  SDOH: he declines name brand meds, due to cost; he takes Synjardy from pt assist.   Interval history: No recent steroids.  It is in general higher as the day goes on.  He stopped nateglinide.  He says this was on the advice of cardiol.  He is certain that cardiol did not mean pioglitazone.  no cbg record, but states cbg's vary from 97-160.   Past Medical History:  Diagnosis Date   Anginal pain (HCC)    Arthritis    ? 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 Followed by Dr. Nickola Major   Arthritis    "ankles" (04/13/2016)   Asthma    CORONARY ARTERY DISEASE    a. cath 05/2014: mild LAD disease, normal LCx and RCA. Risk factor modification recommended.   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)    MSLT 01/09/09 & MRI brain 01/09/09   Pericarditis    recurrent   POLYNEUROPATHY    PULMONARY SARCOIDOSIS    Mediastinal lymphadenopathy with biospy proven sarcodosis   Seizures (HCC)    "none in 4-5 years; don't know what kind; not related to alcohol" (04/13/2016)   TESTICULAR HYPOFUNCTION    TIA (transient ischemic attack)    "I don't remember when" (04/13/2016)    Past Surgical History:  Procedure Laterality Date   BRONCHOSCOPY  08/21/08   CARDIAC CATHETERIZATION  06/25/2009   minimal disease   CARDIAC CATHETERIZATION N/A 04/14/2016   Procedure: Left Heart Cath and Coronary Angiography;  Surgeon:  Kathleene Hazel, MD;  Location: [facility] INVASIVE CV LAB;  Service: Cardiovascular;  Laterality: N/A;   CHOLECYSTECTOMY N/A 09/21/2017   Procedure: LAPAROSCOPIC CHOLECYSTECTOMY WITH INTRAOPERATIVE CHOLANGIOGRAM;  Surgeon: Glenna Fellows, MD;  Location: [facility];  Service: General;  Laterality: N/A;   LEFT HEART CATHETERIZATION WITH CORONARY ANGIOGRAM N/A 05/18/2014   Procedure: LEFT HEART CATHETERIZATION WITH CORONARY ANGIOGRAM;  Surgeon: Corky Crafts, MD;  Location: [facility] CATH LAB;  Service: Cardiovascular;  Laterality: N/A;   MEDIASTINOSCOPY  11/30/08    Social History   Socioeconomic History   Marital status: Significant Other    Spouse name: Not on file   Number of children: 3   Years of education: Not on file   Highest education level: Not on file  Occupational History   Occupation: Unemployed  Tobacco Use   Smoking status: Former    Packs/day: 1.00    Years: 10.00    Pack years: 10.00    Types: Cigarettes    Quit date: 05/18/2009    Years since quitting: 11.7   Smokeless tobacco: Never  Vaping Use   Vaping Use: Never used  Substance and Sexual Activity   Alcohol use: Yes    Comment: rare   Drug use: No   Sexual activity: Yes  Other Topics Concern   Not on  file  Social History Narrative   Married, lives with wife and 3 kids.    Currently student. 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 [employer]   Social Determinants of Corporate investment banker Strain: Low Risk    Difficulty of Paying Living Expenses: Not hard at [employer] Insecurity: No Food Insecurity   Worried About Programme researcher, broadcasting/film/video in the Last Year: Never true   Barista in the Last Year: Never true  Transportation Needs: No Transportation Needs   Lack of Transportation (Medical): No   Lack of Transportation (Non-Medical): No  Physical Activity: Inactive   Days of Exercise per Week: 0 days   Minutes of Exercise per Session: 0 min  Stress: No Stress Concern Present   Feeling of Stress :  Not at all  Social Connections: Socially Integrated   Frequency of Communication with Friends and Family: More than three times a week   Frequency of Social Gatherings with Friends and Family: More than three times a week   Attends Religious Services: More than 4 times per year   Active Member of [employer] or Organizations: No   Attends Engineer, structural: More than 4 times per year   Marital Status: Living with partner  Intimate Partner Violence: Not on file    Current Outpatient Medications on File Prior to Visit  Medication Sig Dispense Refill   albuterol (VENTOLIN HFA) 108 (90 Base) MCG/ACT inhaler Inhale 2 puffs into the lungs every 6 (six) hours as needed for wheezing or shortness of breath. 1 each 3   allopurinol (ZYLOPRIM) 300 MG tablet TAKE 1 TABLET(300 MG) BY MOUTH DAILY 90 tablet 1   aspirin EC 81 MG tablet Take 1 tablet (81 mg total) by mouth daily. 30 tablet 3   cetirizine (ZYRTEC) 10 MG tablet Take 10 mg by mouth daily.     cholecalciferol (VITAMIN D) 1000 units tablet Take 1,000 Units by mouth daily.     clomiPHENE (CLOMID) 50 MG tablet Take 50 mg by mouth every other day.     colchicine 0.6 MG tablet Take 0.6 mg by mouth every 4 (four) hours as needed (gout pain).     docusate sodium (COLACE) 100 MG capsule Take 100 mg by mouth daily.     Empagliflozin-metFORMIN HCl ER (SYNJARDY XR) 12.10-998 MG TB24 Take 2 tablets by mouth daily. 180 tablet 3   ezetimibe (ZETIA) 10 MG tablet Take 1 tablet (10 mg total) by mouth daily. 90 tablet 1   fluticasone (FLONASE) 50 MCG/ACT nasal spray Place 2 sprays into both nostrils daily. 16 g 6   folic acid (FOLVITE) 1 MG tablet Take 1 mg by mouth daily.  1   glucose blood (ONETOUCH VERIO) test strip 1 each by Other route daily. And lancets 1/day (Patient taking differently: 1 each by Other route daily.) 100 each 11   metoprolol succinate (TOPROL-XL) 50 MG 24 hr tablet Take 50 mg by mouth daily. Take with or immediately following a meal.      nitroGLYCERIN (NITROSTAT) 0.4 MG SL tablet Place 1 tablet (0.4 mg total) under the tongue every 5 (five) minutes as needed for chest pain. 25 tablet 3   pantoprazole (PROTONIX) 40 MG tablet TAKE 2 TABLETS(80 MG) BY MOUTH DAILY 180 tablet 0   rosuvastatin (CRESTOR) 40 MG tablet TAKE 1 TABLET(40 MG) BY MOUTH DAILY 90 tablet 1   sacubitril-valsartan (ENTRESTO) 49-51 MG Take 1 tablet by mouth 2 (two) times daily. Via  NOVARTIS pt assistance     saw palmetto 500 MG capsule Take 500 mg by mouth daily.     tadalafil (CIALIS) 20 MG tablet TAKE ONE TABLET BY MOUTH DAILY AS NEEDED 20 tablet 11   tamsulosin (FLOMAX) 0.4 MG CAPS capsule Take 1 capsule (0.4 mg total) by mouth daily. 30 capsule 1   tiZANidine (ZANAFLEX) 4 MG tablet Take 1 tablet (4 mg total) by mouth every 8 (eight) hours as needed for muscle spasms. 60 tablet 1   Zinc Sulfate (ZINC 15 PO) Take 1 tablet by mouth daily.     No current facility-administered medications on file prior to visit.    Allergies  Allergen Reactions   Amlodipine Other (See Comments)    Ankle swelling with 10mg , ok with 5mg  dose   Sulfamethoxazole Rash    fever   Sulfonamide Derivatives Rash and Other (See Comments)    Family History  Problem Relation Age of Onset   Diabetes Mother    Hypertension Mother    Colon polyps Mother    Diabetes Father    Heart disease Father 86       Fatal MI   Heart failure Father    Colon polyps Father    Pancreatic cancer Maternal Grandmother    Stomach cancer Maternal Uncle    Pancreatic cancer Maternal Uncle    Colon cancer Neg Hx    Esophageal cancer Neg Hx    Rectal cancer Neg Hx    Liver disease Neg Hx     BP 120/70 (BP Location: Right Arm, Patient Position: Sitting, Cuff Size: Large)   Pulse 74   Ht 6\' 3"  (1.905 m)   Wt 269 lb 9.6 oz (122.3 kg)   SpO2 97%   BMI 33.70 kg/m    Review of Systems     Objective:   Physical Exam Pulses: dorsalis pedis intact bilat.   MSK: no deformity of the feet CV:  no leg edema Skin:  no ulcer on the feet.  normal color and temp on the feet. Neuro: sensation is intact to touch on the feet  Lab Results  Component Value Date   HGBA1C 7.4 (A) 02/18/2021       Assessment & Plan:  Type 2 DM: uncontrolled.    Patient Instructions  I have sent a prescription to your pharmacy, to add colesevalam Please continue the same other medications.  check your blood sugar once a day.  vary the time of day when you check, between before the 3 meals, and at bedtime.  also check if you have symptoms of your blood sugar being too high or too low.  please keep a record of the readings and bring it to your next appointment here (or you can bring the meter itself).  You can write it on any piece of paper.  please call 49 sooner if your blood sugar goes below 70, or if you have a lot of readings over 200.   Please come back for a follow-up appointment in January.

## 2021-02-18 NOTE — Patient Instructions (Addendum)
I have sent a prescription to your pharmacy, to add colesevalam Please continue the same other medications.  check your blood sugar once a day.  vary the time of day when you check, between before the 3 meals, and at bedtime.  also check if you have symptoms of your blood sugar being too high or too low.  please keep a record of the readings and bring it to your next appointment here (or you can bring the meter itself).  You can write it on any piece of paper.  please call us sooner if your blood sugar goes below 70, or if you have a lot of readings over 200.   Please come back for a follow-up appointment in January.

## 2021-02-19 ENCOUNTER — Other Ambulatory Visit (HOSPITAL_COMMUNITY): Payer: Self-pay

## 2021-02-19 ENCOUNTER — Other Ambulatory Visit: Payer: Self-pay | Admitting: Endocrinology

## 2021-02-19 MED ORDER — COLESEVELAM HCL 625 MG PO TABS: 1250.0000 mg | ORAL_TABLET | Freq: Every day | ORAL | 11 refills | Status: DC

## 2021-02-20 ENCOUNTER — Telehealth: Payer: Self-pay | Admitting: Pharmacy Technician

## 2021-02-20 NOTE — Telephone Encounter (Addendum)
Patient Advocate Encounter   Received notification from [facility] that prior authorization for WELCHOL/COLESEVELAM is required.   PA submitted on 02/20/2021 Key [id number] Status is APPROVED Effective from 02/20/2021 through 02/20/2022.    [facility] will continue to follow   Jeannette How, CPhT Patient Advocate [facility] Phone: [phone] Fax:  [phone]

## 2021-02-28 DIAGNOSIS — I2510 Atherosclerotic heart disease of native coronary artery without angina pectoris: Secondary | ICD-10-CM | POA: Diagnosis not present

## 2021-02-28 DIAGNOSIS — I10 Essential (primary) hypertension: Secondary | ICD-10-CM | POA: Diagnosis not present

## 2021-02-28 DIAGNOSIS — E785 Hyperlipidemia, unspecified: Secondary | ICD-10-CM | POA: Diagnosis not present

## 2021-03-05 DIAGNOSIS — F17290 Nicotine dependence, other tobacco product, uncomplicated: Secondary | ICD-10-CM | POA: Diagnosis not present

## 2021-03-05 DIAGNOSIS — I25118 Atherosclerotic heart disease of native coronary artery with other forms of angina pectoris: Secondary | ICD-10-CM | POA: Diagnosis not present

## 2021-03-05 DIAGNOSIS — I10 Essential (primary) hypertension: Secondary | ICD-10-CM | POA: Diagnosis not present

## 2021-03-05 DIAGNOSIS — E785 Hyperlipidemia, unspecified: Secondary | ICD-10-CM | POA: Diagnosis not present

## 2021-03-07 DIAGNOSIS — I10 Essential (primary) hypertension: Secondary | ICD-10-CM

## 2021-03-07 DIAGNOSIS — E1169 Type 2 diabetes mellitus with other specified complication: Secondary | ICD-10-CM

## 2021-03-07 DIAGNOSIS — E785 Hyperlipidemia, unspecified: Secondary | ICD-10-CM | POA: Diagnosis not present

## 2021-03-07 DIAGNOSIS — I110 Hypertensive heart disease with heart failure: Secondary | ICD-10-CM | POA: Diagnosis not present

## 2021-03-07 DIAGNOSIS — I25118 Atherosclerotic heart disease of native coronary artery with other forms of angina pectoris: Secondary | ICD-10-CM

## 2021-03-07 DIAGNOSIS — J45990 Exercise induced bronchospasm: Secondary | ICD-10-CM

## 2021-03-07 DIAGNOSIS — E669 Obesity, unspecified: Secondary | ICD-10-CM

## 2021-03-27 ENCOUNTER — Telehealth: Payer: Self-pay | Admitting: Pharmacist

## 2021-03-27 NOTE — Telephone Encounter (Signed)
Patient brought renewal documents for [facility] patient assistance ([employer] Cares) for 2023. Completed forms and faxed to [facility].

## 2021-04-16 ENCOUNTER — Telehealth: Payer: Self-pay

## 2021-04-16 NOTE — Progress Notes (Signed)
    Chronic Care Management Pharmacy Assistant   Name: [name]  MRN: [id number] DOB: 1971/02/19   Patient assistance renewal  application for [name] will be mailed to patient for completion and to be taken to Dr. [name] to complete and fax.  Patient aware he will need to provide requested income verification to be sent the manufacturer.    [name] Clinical Pharmacist Assistant [phone]

## 2021-04-17 ENCOUNTER — Other Ambulatory Visit: Payer: Self-pay | Admitting: Internal Medicine

## 2021-04-17 DIAGNOSIS — K2100 Gastro-esophageal reflux disease with esophagitis, without bleeding: Secondary | ICD-10-CM

## 2021-04-22 ENCOUNTER — Ambulatory Visit: Payer: Medicare Other | Admitting: Endocrinology

## 2021-04-22 ENCOUNTER — Other Ambulatory Visit: Payer: Self-pay

## 2021-04-22 VITALS — BP 120/70 | HR 76 | Ht 75.0 in | Wt 264.4 lb

## 2021-04-22 DIAGNOSIS — E669 Obesity, unspecified: Secondary | ICD-10-CM

## 2021-04-22 DIAGNOSIS — E1169 Type 2 diabetes mellitus with other specified complication: Secondary | ICD-10-CM | POA: Diagnosis not present

## 2021-04-22 LAB — POCT GLYCOSYLATED HEMOGLOBIN (HGB A1C): Hemoglobin A1C: 7.6 % — AB (ref 4.0–5.6)

## 2021-04-22 MED ORDER — REPAGLINIDE 0.5 MG PO TABS: 0.5000 mg | ORAL_TABLET | Freq: Two times a day (BID) | ORAL | 3 refills | Status: DC

## 2021-04-22 NOTE — Progress Notes (Signed)
Subjective:    Patient ID: [name], male    DOB: 1970/10/04, 50 y.o.   MRN: [id number]  HPI Pt returns for f/u of diabetes mellitus:  DM type: 2 Dx'ed: 2008 Complications: CAD Therapy: Synjardy.     DKA: never Severe hypoglycemia: never.  Pancreatitis: 2019 (GB) Pancreatic imaging: 2011 CT: mild fatty replacement in the head and uncinate process.   Other: he has never been on insulin; He stopped pioglitazone, due to weight gain.  SDOH: he declines name brand meds, due to cost; he takes Synjardy from pt assist; He stopped nateglinide.  He says this was on the advice of cardiol.  He is certain that cardiol did not mean pioglitazone.  Interval history: No recent steroids.  no cbg record, but states cbg's vary from 130-200.   Past Medical History:  Diagnosis Date   Anginal pain (HCC)    Arthritis    ? 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 Followed by Dr. Nickola Major   Arthritis    "ankles" (04/13/2016)   Asthma    CORONARY ARTERY DISEASE    a. cath 05/2014: mild LAD disease, normal LCx and RCA. Risk factor modification recommended.   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)    MSLT 01/09/09 & MRI brain 01/09/09   Pericarditis    recurrent   POLYNEUROPATHY    PULMONARY SARCOIDOSIS    Mediastinal lymphadenopathy with biospy proven sarcodosis   Seizures (HCC)    "none in 4-5 years; don't know what kind; not related to alcohol" (04/13/2016)   TESTICULAR HYPOFUNCTION    TIA (transient ischemic attack)    "I don't remember when" (04/13/2016)    Past Surgical History:  Procedure Laterality Date   BRONCHOSCOPY  08/21/08   CARDIAC CATHETERIZATION  06/25/2009   minimal disease   CARDIAC CATHETERIZATION N/A 04/14/2016   Procedure: Left Heart Cath and Coronary Angiography;  Surgeon: Kathleene Hazel, MD;  Location: [facility]  INVASIVE CV LAB;  Service: Cardiovascular;  Laterality: N/A;   CHOLECYSTECTOMY N/A 09/21/2017   Procedure: LAPAROSCOPIC CHOLECYSTECTOMY WITH INTRAOPERATIVE CHOLANGIOGRAM;  Surgeon: Glenna Fellows, MD;  Location: [facility] OR[facility];  Service: General;  Laterality: N/A;   LEFT HEART CATHETERIZATION WITH CORONARY ANGIOGRAM N/A 05/18/2014   Procedure: LEFT HEART CATHETERIZATION WITH CORONARY ANGIOGRAM;  Surgeon: Corky Crafts, MD;  Location: [facility] CATH LAB;  Service: Cardiovascular;  Laterality: N/A;   MEDIASTINOSCOPY  11/30/08    Social History   Socioeconomic History   Marital status: Significant Other    Spouse name: Not on file   Number of children: 3   Years of education: Not on file   Highest education level: Not on file  Occupational History   Occupation: Unemployed  Tobacco Use   Smoking status: Former    Packs/day: 1.00    Years: 10.00    Pack years: 10.00    Types: Cigarettes    Quit date: 05/18/2009    Years since quitting: 11.9   Smokeless tobacco: Never  Vaping Use   Vaping Use: Never used  Substance and Sexual Activity   Alcohol use: Yes    Comment: rare   Drug use: No   Sexual activity: Yes  Other Topics Concern   Not on file  Social History Narrative   Married, lives with wife and 3  kids.    Currently student. 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 [employer]   Social Determinants of Corporate investment banker Strain: Low Risk    Difficulty of Paying Living Expenses: Not hard at [employer] Insecurity: No Food Insecurity   Worried About Programme researcher, broadcasting/film/video in the Last Year: Never true   Barista in the Last Year: Never true  Transportation Needs: No Transportation Needs   Lack of Transportation (Medical): No   Lack of Transportation (Non-Medical): No  Physical Activity: Inactive   Days of Exercise per Week: 0 days   Minutes of Exercise per Session: 0 min  Stress: No Stress Concern Present   Feeling of Stress : Not at all  Social Connections: Socially  Integrated   Frequency of Communication with Friends and Family: More than three times a week   Frequency of Social Gatherings with Friends and Family: More than three times a week   Attends Religious Services: More than 4 times per year   Active Member of [employer] or Organizations: No   Attends Engineer, structural: More than 4 times per year   Marital Status: Living with partner  Intimate Partner Violence: Not on file    Current Outpatient Medications on File Prior to Visit  Medication Sig Dispense Refill   albuterol (VENTOLIN HFA) 108 (90 Base) MCG/ACT inhaler Inhale 2 puffs into the lungs every 6 (six) hours as needed for wheezing or shortness of breath. 1 each 3   allopurinol (ZYLOPRIM) 300 MG tablet TAKE 1 TABLET(300 MG) BY MOUTH DAILY 90 tablet 1   aspirin EC 81 MG tablet Take 1 tablet (81 mg total) by mouth daily. 30 tablet 3   cetirizine (ZYRTEC) 10 MG tablet Take 10 mg by mouth daily.     cholecalciferol (VITAMIN D) 1000 units tablet Take 1,000 Units by mouth daily.     clomiPHENE (CLOMID) 50 MG tablet Take 50 mg by mouth every other day.     colchicine 0.6 MG tablet Take 0.6 mg by mouth every 4 (four) hours as needed (gout pain).     colesevelam (WELCHOL) 625 MG tablet Take 2 tablets (1,250 mg total) by mouth daily. 60 tablet 11   docusate sodium (COLACE) 100 MG capsule Take 100 mg by mouth daily.     Empagliflozin-metFORMIN HCl ER (SYNJARDY XR) 12.10-998 MG TB24 Take 2 tablets by mouth daily. 180 tablet 3   ezetimibe (ZETIA) 10 MG tablet Take 1 tablet (10 mg total) by mouth daily. 90 tablet 1   fluticasone (FLONASE) 50 MCG/ACT nasal spray Place 2 sprays into both nostrils daily. 16 g 6   folic acid (FOLVITE) 1 MG tablet Take 1 mg by mouth daily.  1   glucose blood (ONETOUCH VERIO) test strip 1 each by Other route daily. And lancets 1/day (Patient taking differently: 1 each by Other route daily.) 100 each 11   metoprolol succinate (TOPROL-XL) 50 MG 24 hr tablet Take 50 mg  by mouth daily. Take with or immediately following a meal.     nitroGLYCERIN (NITROSTAT) 0.4 MG SL tablet Place 1 tablet (0.4 mg total) under the tongue every 5 (five) minutes as needed for chest pain. 25 tablet 3   pantoprazole (PROTONIX) 40 MG tablet TAKE 2 TABLETS(80 MG) BY MOUTH DAILY 180 tablet 0   rosuvastatin (CRESTOR) 40 MG tablet TAKE 1 TABLET(40 MG) BY MOUTH DAILY 90 tablet 1   sacubitril-valsartan (ENTRESTO) 49-51 MG Take 1 tablet by  mouth 2 (two) times daily. Via NOVARTIS pt assistance     saw palmetto 500 MG capsule Take 500 mg by mouth daily.     tadalafil (CIALIS) 20 MG tablet TAKE ONE TABLET BY MOUTH DAILY AS NEEDED 20 tablet 11   tamsulosin (FLOMAX) 0.4 MG CAPS capsule Take 1 capsule (0.4 mg total) by mouth daily. 30 capsule 1   tiZANidine (ZANAFLEX) 4 MG tablet Take 1 tablet (4 mg total) by mouth every 8 (eight) hours as needed for muscle spasms. 60 tablet 1   Zinc Sulfate (ZINC 15 PO) Take 1 tablet by mouth daily.     No current facility-administered medications on file prior to visit.    Allergies  Allergen Reactions   Amlodipine Other (See Comments)    Ankle swelling with 10mg , ok with 5mg  dose   Sulfamethoxazole Rash    fever   Sulfonamide Derivatives Rash and Other (See Comments)    Family History  Problem Relation Age of Onset   Diabetes Mother    Hypertension Mother    Colon polyps Mother    Diabetes Father    Heart disease Father 72       Fatal MI   Heart failure Father    Colon polyps Father    Pancreatic cancer Maternal Grandmother    Stomach cancer Maternal Uncle    Pancreatic cancer Maternal Uncle    Colon cancer Neg Hx    Esophageal cancer Neg Hx    Rectal cancer Neg Hx    Liver disease Neg Hx     BP 120/70 (BP Location: Right Arm, Patient Position: Sitting, Cuff Size: Large)   Pulse 76   Ht 6\' 3"  (1.905 m)   Wt 264 lb 6.4 oz (119.9 kg)   SpO2 98%   BMI 33.05 kg/m    Review of Systems He denies hypoglycemia.      Objective:    Physical Exam    A1c=7.6%    Assessment & Plan:  Type 2 DM: uncontrolled  Patient Instructions  I have sent a prescription to your pharmacy, to add repaglinide.   Please continue the same other medications.  check your blood sugar once a day.  vary the time of day when you check, between before the 3 meals, and at bedtime.  also check if you have symptoms of your blood sugar being too high or too low.  please keep a record of the readings and bring it to your next appointment here (or you can bring the meter itself).  You can write it on any piece of paper.  please call sooner if your blood sugar goes below 70, or if you have a lot of readings over 200.   Please come back for a follow-up appointment in 3 months.

## 2021-04-22 NOTE — Patient Instructions (Addendum)
I have sent a prescription to your pharmacy, to add "repaglinide." Please continue the same other medications. check your blood sugar once a day.  vary the time of day when you check, between before the 3 meals, and at bedtime.  also check if you have symptoms of your blood sugar being too high or too low.  please keep a record of the readings and bring it to your next appointment here (or you can bring the meter itself).  You can write it on any piece of paper.  please call us sooner if your blood sugar goes below 70, or if you have a lot of readings over 200. Please come back for a follow-up appointment in 3 months 

## 2021-05-02 ENCOUNTER — Other Ambulatory Visit: Payer: Self-pay | Admitting: Internal Medicine

## 2021-05-13 NOTE — Telephone Encounter (Signed)
Type of form received- [employer] Patient [name].   Form placed in- Pharmacy Mailbox  Additional instructions from the patient- fax or mail when completed  Things to remember: Front office: If form received in person, remind patient that forms take 7-10 business days CMA should attach charge sheet and put on [employer]

## 2021-05-15 ENCOUNTER — Telehealth: Payer: Medicare Other

## 2021-05-15 NOTE — Telephone Encounter (Signed)
PAP application faxed to [employer] for [employer] to follow up in 1 week to check status of application

## 2021-05-30 ENCOUNTER — Telehealth: Payer: Medicare Other

## 2021-06-01 ENCOUNTER — Other Ambulatory Visit: Payer: Self-pay | Admitting: Internal Medicine

## 2021-06-01 DIAGNOSIS — K2100 Gastro-esophageal reflux disease with esophagitis, without bleeding: Secondary | ICD-10-CM

## 2021-06-12 ENCOUNTER — Other Ambulatory Visit: Payer: Self-pay | Admitting: Internal Medicine

## 2021-06-12 ENCOUNTER — Ambulatory Visit (INDEPENDENT_AMBULATORY_CARE_PROVIDER_SITE_OTHER): Payer: Medicare Other

## 2021-06-12 ENCOUNTER — Telehealth: Payer: Medicare Other

## 2021-06-12 ENCOUNTER — Other Ambulatory Visit: Payer: Self-pay

## 2021-06-12 DIAGNOSIS — M1A09X0 Idiopathic chronic gout, multiple sites, without tophus (tophi): Secondary | ICD-10-CM

## 2021-06-12 DIAGNOSIS — E1169 Type 2 diabetes mellitus with other specified complication: Secondary | ICD-10-CM

## 2021-06-12 DIAGNOSIS — E785 Hyperlipidemia, unspecified: Secondary | ICD-10-CM

## 2021-06-12 DIAGNOSIS — E669 Obesity, unspecified: Secondary | ICD-10-CM

## 2021-06-12 DIAGNOSIS — I10 Essential (primary) hypertension: Secondary | ICD-10-CM

## 2021-06-12 DIAGNOSIS — I25118 Atherosclerotic heart disease of native coronary artery with other forms of angina pectoris: Secondary | ICD-10-CM

## 2021-06-12 MED ORDER — COLCHICINE 0.6 MG PO TABS: 0.6000 mg | ORAL_TABLET | Freq: Two times a day (BID) | ORAL | 0 refills | Status: DC

## 2021-06-12 MED ORDER — NITROGLYCERIN 0.4 MG SL SUBL: 0.4000 mg | SUBLINGUAL_TABLET | SUBLINGUAL | 3 refills | Status: AC | PRN

## 2021-06-12 NOTE — Patient Instructions (Signed)
Visit Information  Following are the goals we discussed today:   Manage My Medication   Timeframe:  Long-Range Goal Priority:  Medium Start Date:    11/13/20                         Expected End Date:   06/11/2022                  Follow Up Date April 2023   - call for medicine refill 2 or 3 days before it runs out - call if I am sick and can't take my medicine - keep a list of all the medicines I take; vitamins and herbals too - Collaborate with providers for Lenord Carbo patient assistance  - Use Albuterol inhaler as needed; contact PCP if symptoms do not improve   Why is this important?   These steps will help you keep on track with your medicines.  Plan: Telephone follow up appointment with care management team member scheduled for:  3 months  The patient has been provided with contact information for the care management team and has been advised to call with any health related questions or concerns.   Ellin Saba, PharmD Clinical Pharmacist, [facility]   Please call the care guide team at [phone] if you need to cancel or reschedule your appointment.   Patient verbalizes understanding of instructions provided today and agrees to view in MyChart.

## 2021-06-12 NOTE — Progress Notes (Signed)
Chronic Care Management Pharmacy Note  06/12/2021 Name:  [name] MRN:  [id number] DOB:  12-May-1971  Summary: -Patient reports that he had to discontinue repaglinide as it caused him an upset stomach, no longer taking colesevelam as well -reports that he had run out of entresto but recently restarted, BP coming back down into range since he has restarted (no longer qualifies for entresto PAP - able to afford without at this time) -Reports that fasting BG in AM averaging 130 - 2 hour post prandial averaging 190  Recommendations/Changes made from today's visit: -Patient to make changes to diet to help improve BG and bring A1c to goal agreeable to start drinking carb free drinks - continue to monitor blood sugars - has follow up with endo in 6 weeks  -Refill requests for nitrostat and colchicine sent to PCP per patient request   Subjective: [name] is an 51 y.o. year old male who is a primary patient of Janith Lima, MD.  The CCM team was consulted for assistance with disease management and care coordination needs.    Engaged with patient by telephone for follow up visit in response to provider referral for pharmacy case management and/or care coordination services.   Consent to Services:  The patient was given information about Chronic Care Management services, agreed to services, and gave verbal consent prior to initiation of services.  Please see initial visit note for detailed documentation.   Patient Care Team: Janith Lima, MD as PCP - General (Internal Medicine) Dorothy Spark, MD as PCP - Cardiology (Cardiology) Kittie Plater as Consulting Physician (Rheumatology) Mosetta Anis, MD as Referring Physician (Rheumatology) Chesley Mires, MD as Consulting Physician (Pulmonary Disease) Renato Shin, MD as Consulting Physician (Endocrinology) Dorothy Spark, MD (Cardiology) Charlton Haws, [facility] as Pharmacist (Pharmacist) Webb Laws, [place]  as Referring Physician (Optometry)  Recent office visits: None since last visit   Recent consult visits: 04/22/2021 - Dr. Loanne Drilling - Endocrinology - started repaglinide before meals  02/18/2021 - Dr. Loanne Drilling - Endocrinology  - stopped nateglinide - started [facility] visits: None in previous 6 months   Objective:  Lab Results  Component Value Date   CREATININE 0.89 12/05/2020   BUN 12 12/05/2020   GFR 101.70 12/05/2020   GFRNONAA >60 02/11/2020   GFRAA >60 02/11/2020   NA 142 12/05/2020   K 3.8 12/05/2020   CALCIUM 9.2 12/05/2020   CO2 27 12/05/2020    Lab Results  Component Value Date/Time   HGBA1C 7.6 (A) 04/22/2021 07:45 AM   HGBA1C 7.4 (A) 02/18/2021 08:01 AM   HGBA1C 7.9 (H) 12/05/2020 03:02 PM   HGBA1C 6.8 (H) 08/29/2019 08:47 AM   GFR 101.70 12/05/2020 03:02 PM   GFR 102.00 07/05/2020 09:17 AM   MICROALBUR 4.7 (H) 12/05/2020 03:02 PM   MICROALBUR 1.0 08/29/2019 08:47 AM    Last diabetic Eye exam:  Lab Results  Component Value Date/Time   HMDIABEYEEXA No Retinopathy 11/05/2020 12:00 AM    Last diabetic Foot exam:  Lab Results  Component Value Date/Time   HMDIABFOOTEX Done 04/10/2010 12:00 AM    MRI Pancreas 07/14/20: IMPRESSION: 1. No MR evidence of pancreatic mass or other acute findings in the abdomen. No pancreatic ductal dilatation. 2. Mild hepatic steatosis. 3. Status post cholecystectomy.  Lab Results  Component Value Date   CHOL 98 12/05/2020   HDL 32.20 (L) 12/05/2020   LDLCALC 42 12/05/2020   TRIG 115.0 12/05/2020   CHOLHDL 3  12/05/2020    Hepatic Function Latest Ref Rng & Units 07/05/2020 01/09/2020 08/29/2019  Total Protein 6.0 - 8.3 g/dL 8.1 7.6 7.5  Albumin 3.5 - 5.2 g/dL 4.5 4.0 4.2  AST 0 - 37 U/L _0 ALT 0 - 53 U/L _1 Alk Phosphatase 39 - 117 U/L 89 77 84  Total Bilirubin 0.2 - 1.2 mg/dL 0.4 0.3 0.4  Bilirubin, Direct 0.0 - 0.3 mg/dL - - 0.1    Lab Results  Component Value Date/Time   TSH 0.62 06/14/2018  03:56 PM   TSH 0.66 03/01/2017 12:05 PM   FREET4 0.7 11/13/2008 11:12 AM    CBC Latest Ref Rng & Units 07/05/2020 02/11/2020 01/09/2020  WBC 4.0 - 10.5 K/uL 6.1 8.5 6.5  Hemoglobin 13.0 - 17.0 g/dL 14.4 15.2 14.3  Hematocrit 39.0 - 52.0 % 42.7 45.0 41.7  Platelets 150.0 - 400.0 K/uL 269.0 249 241    No results found for: VD25OH  Clinical ASCVD: No  The ASCVD Risk score (Arnett DK, et al., 2019) failed to calculate for the following reasons:   The valid total cholesterol range is 130 to 320 mg/dL    Depression screen [facility] 2/9 07/22/2020 08/29/2019 06/14/2018  Decreased Interest 0 0 0  Down, Depressed, Hopeless 0 0 0  PHQ - 2 Score 0 0 0  Altered sleeping - 1 -  Tired, decreased energy - 0 -  Change in appetite - 0 -  Feeling bad or failure about yourself  - 0 -  Trouble concentrating - 0 -  Moving slowly or fidgety/restless - 0 -  Suicidal thoughts - 0 -  PHQ-9 Score - 1 -  Difficult doing work/chores - Not difficult at all -  Some recent data might be hidden     Social History   Tobacco Use  Smoking Status Former   Packs/day: 1.00   Years: 10.00   Pack years: 10.00   Types: Cigarettes   Quit date: 05/18/2009   Years since quitting: 12.0  Smokeless Tobacco Never   BP Readings from Last 3 Encounters:  04/22/21 120/70  02/18/21 120/70  12/05/20 126/74   Pulse Readings from Last 3 Encounters:  04/22/21 76  02/18/21 74  12/05/20 66   Wt Readings from Last 3 Encounters:  04/22/21 264 lb 6.4 oz (119.9 kg)  02/18/21 269 lb 9.6 oz (122.3 kg)  12/05/20 247 lb (112 kg)    Assessment/Interventions: Review of patient past medical history, allergies, medications, health status, including review of consultants reports, laboratory and other test data, was performed as part of comprehensive evaluation and provision of chronic care management services.   SDOH:  (Social Determinants of Health) assessments and interventions performed: Yes   CCM Care Plan  Allergies  Allergen  Reactions   Amlodipine Other (See Comments)    Ankle swelling with 13m, ok with 546mdose   Sulfamethoxazole Rash    fever   Sulfonamide Derivatives Rash and Other (See Comments)    Medications Reviewed Today     Reviewed by SzTomasa BlaseRPConemaugh Meyersdale Medical CenterPharmacist) on 06/12/21 at 1016  Med List Status: <None>   Medication Order Taking? Sig Documenting Provider Last Dose Status Informant  albuterol (VENTOLIN HFA) 108 (90 Base) MCG/ACT inhaler [id number] Inhale 2 puffs into the lungs every 6 (six) hours as needed for wheezing or shortness of breath. JoJanith LimaMD Taking Active   allopurinol (ZYLOPRIM) 300 MG tablet [id number] TAKE 1 TABLET(300 MG) BY MOUTH  DAILY Janith Lima, MD Taking Active   aspirin EC 81 MG tablet [id number] Yes Take 1 tablet (81 mg total) by mouth daily. Rowe Clack, MD Taking Active Self  cetirizine (ZYRTEC) 10 MG tablet [id number] Yes Take 10 mg by mouth daily. [provider] Taking Active   cholecalciferol (VITAMIN D) 1000 units tablet [id number] Yes Take 1,000 Units by mouth daily. [provider] Taking Active Self  clomiPHENE (CLOMID) 50 MG tablet [id number] Yes Take 50 mg by mouth every other day. [provider] Taking Active   colchicine 0.6 MG tablet [id number]  Take 0.6 mg by mouth every 4 (four) hours as needed (gout pain). [provider]  Active Self           Med Note Charlton Haws   Tue Aug 29, 2019 11:32 AM)    colesevelam [facility]) 625 MG tablet [id number] No Take 2 tablets (1,250 mg total) by mouth daily.  Patient not taking: Reported on 06/12/2021   Renato Shin, MD Not Taking Active   docusate sodium (COLACE) 100 MG capsule [id number] Yes Take 100 mg by mouth daily. [provider] Taking Active   Empagliflozin-metFORMIN HCl ER (SYNJARDY XR) 12.10-998 MG TB24 [id number] Yes Take 2 tablets by mouth daily. Renato Shin, MD Taking Active            Med Note Rolan Bucco Feb 13, 2021  9:22 AM) Via [facility] [employer] pt assistance  fluticasone [facility]) 50 MCG/ACT nasal spray [id number] Yes Place 2 sprays into both nostrils daily. Marrian Salvage, FNP Taking Active   folic acid (FOLVITE) 1 MG tablet [id number] Yes Take 1 mg by mouth daily. [provider] Taking Active Self           Med Note Earley Brooke Jun 28, 2017  8:49 AM)    glucose blood [facility] VERIO) test strip [id number] Yes 1 each by Other route daily. And lancets 1/day  Patient taking differently: 1 each by Other route daily.   Renato Shin, MD Taking Active   metoprolol succinate (TOPROL-XL) 50 MG 24 hr tablet [id number] Yes Take 50 mg by mouth daily. Take with or immediately following a meal. Charolette Forward, MD Taking Active   nitroGLYCERIN (NITROSTAT) 0.4 MG SL tablet [id number] No Place 1 tablet (0.4 mg total) under the tongue every 5 (five) minutes as needed for chest pain.  Patient not taking: Reported on 06/12/2021   Consuelo Pandy, PA-C Not Taking Active Self  pantoprazole (PROTONIX) 40 MG tablet [id number] Yes TAKE 2 TABLETS(80 MG) BY MOUTH DAILY Janith Lima, MD Taking Active   repaglinide (PRANDIN) 0.5 MG tablet [id number] No Take 1 tablet (0.5 mg total) by mouth 2 (two) times daily before a meal.  Patient not taking: Reported on 06/12/2021   Renato Shin, MD Not Taking Active   rosuvastatin (CRESTOR) 40 MG tablet [id number] Yes TAKE 1 TABLET(40 MG) BY MOUTH DAILY Janith Lima, MD Taking Active   sacubitril-valsartan (ENTRESTO) 49-51 MG [id number] Yes Take 1 tablet by mouth 2 (two) times daily. Via NOVARTIS pt assistance Charolette Forward, MD Taking Active   saw palmetto 500 MG capsule [id number] Yes Take 500 mg by mouth daily. [provider] Taking Active   tadalafil (CIALIS) 20 MG tablet [id number] Yes TAKE ONE TABLET BY MOUTH DAILY AS NEEDED McKenzie, Candee Furbish, MD Taking Active   Zinc Sulfate (ZINC 15 PO) [id number] Yes Take 1 tablet by mouth daily. [provider] Taking Active             Patient Active Problem List   Diagnosis Date Noted   Dysuria 12/05/2020   Chronic bilateral low back pain with left-sided sciatica 11/21/2020   Urinary frequency 03/21/2020   Irritable bowel syndrome with constipation 01/10/2020   Ejection fraction < 50% 09/26/2019   LVH (left ventricular hypertrophy) due to hypertensive disease, with heart failure ([place]) 09/26/2019   Routine general medical examination at a health care facility 06/14/2018   Acute bacterial prostatitis 11/17/2017   Hepatic steatosis 11/03/2017   Primary narcolepsy without cataplexy 06/22/2017   Periodic limb movements of sleep 06/22/2017   Hypersomnia 06/22/2017   Nonintractable epilepsy with complex partial seizures ([place]) 03/23/2017   Drug-induced erectile dysfunction 11/06/2015   Body mass index (BMI) of 35.0 to 35.9 with comorbidity    Sarcoidosis ([place]) 09/29/2010   GERD 01/07/2010   Exercise-induced asthma 12/03/2009   POLYNEUROPATHY 10/17/2009   Male hypogonadism 10/08/2009   Diabetes mellitus type 2 in obese ([place]) 10/07/2009   Hyperlipidemia with target LDL less than 70 10/07/2009   Gout 10/07/2009   Essential hypertension 10/07/2009   CAD (coronary artery disease) 10/07/2009   Male erectile dysfunction 10/07/2009    Immunization History  Administered Date(s) Administered   Influenza Split 04/06/2011, 02/17/2012, 03/16/2012   Influenza,inj,Quad PF,6+ Mos 03/21/2013, 03/08/2014, 04/03/2015, 02/19/2016, 02/17/2017, 06/14/2018, 02/20/2019, 03/21/2020   Influenza-Unspecified 03/08/2021   Moderna Sars-Covid-2 Vaccination 07/31/2019, 09/05/2019, 04/19/2020   Pneumococcal Conjugate-13 04/29/2015   Pneumococcal Polysaccharide-23 11/06/2015   Tdap 03/21/2013    Conditions to be addressed/monitored:  Hypertension, Hyperlipidemia, Diabetes, Heart Failure and Coronary Artery Disease  Care Plan : CCM Pharmacy Care Plan  Updates made by Tomasa Blase, RPH since 06/12/2021  12:00 AM     Problem: Hypertension, Hyperlipidemia, Diabetes, Heart Failure and Coronary Artery Disease   Priority: High     Long-Range Goal: Disease management   Start Date: 08/06/2020  Expected End Date: 02/06/2021  This Visit's Progress: Not on track  Recent Progress: On track  Priority: High  Note:   Current Barriers:  Unable to independently monitor therapeutic efficacy  Pharmacist Clinical Goal(s):  Patient will achieve adherence to monitoring guidelines and medication adherence to achieve therapeutic efficacy  Interventions: 1:1 collaboration with Janith Lima, MD regarding development and update of comprehensive plan of care as evidenced by provider attestation and co-signature Inter-disciplinary care team collaboration (see longitudinal plan of care) Comprehensive medication review performed; medication list updated in electronic medical record  Hypertension / Heart failure (BP goal < 130/80) Controlled - pt reports BP elevated recently as he was out of entresto - has restarted and is coming back into previous range - no longer qualifies for entresto PAP - able to afford at this time  BP Readings from Last 3 Encounters:  04/22/21 120/70  02/18/21 120/70  12/05/20 126/74  Current regimen:  Metoprolol succinate 50 mg daily Entresto 49-51 mg BID Interventions: Discussed BP goals and benefits of medications for prevention of heart attack / stroke Patient has regular follow up with cardiologist Recommend to continue current medications   Hyperlipidemia / CAD (LDL goal < 70)   CAD: 09/2019 nuclear stress test low risk study. Controlled  - LDL is at goal; pt endorses compliance with statin and denies issues Lab Results  Component Value Date   LDLCALC 42 12/05/2020  Current regimen:  rosuvastatin 40 mg daily,  nitroglycerin 0.4 mg as needed aspirin 81 mg daily  Interventions: Discussed cholesterol goals and benefits of medications for prevention of heart attack /  stroke Recommend to continue current medication, patient requests a refill of nitrostat    Diabetes (A1c goal < 7%) Not ideally controlled - last A1c 7.6%; pt follows with Dr Loanne Drilling; patient unsure if Iva Boop has been approved at this time, his income has changes and he notes he may not qualify this year Lab Results  Component Value Date   HGBA1C 7.6 (A) 04/22/2021  -Fasting BG 97-110 Current regimen:  Synjardy XR 12.10-998 mg - 2 tablets daily  Repaglinide 0.68m - 1 tablet TID - stopped due to stomach upset  Past tried/failed meds: Invokana, Farxiga, [name], pioglitazone (edema), nateglinide  Interventions: Discussed blood sugar goals and benefits of medications for prevention of diabetic complications Discussed history of pancreatitis (09/2017) limits use of other classes of diabetes medications (GLP-1 and DPP-IV) Discussed ways to improve diet - switch to diet sodas/juices - patient reports to drinking usually at least 3 regular sodas / day, reviewed carb/ sugar content in soda - agreeable to switch to zero carb drinks  Recommend to continue current medication  Allergic rhinitis / Asthma (Goal: reduce symptoms) -Controlled - pt reports increased coughing/shortness of breath lately, he reports this happens every year around this time; he requests refill for albuterol inhaler -Current treatment  Cetirizine 10 mg daily Albuterol HFA prn  Fluticasone nasal spray PRN -Counseled on proper inhaler use; seasonal nature of symptoms point to allergic cause;  -Advised patient to contact PCP if symptoms do not improve  Patient Goals/Self-Care Activities Patient will:  - take medications as prescribed -check glucose daily, document, and provide at future appointments -check blood pressure daily, document, and provide at future appointments -weigh daily, and contact provider if weight gain of 2+ lbs overnight or 5+ lbs in a week -collaborate with providers on medication access  solutions -Use Albuterol inhaler as needed; contact PCP if symptoms do not improve      Care Gaps: Vaccines -Pneumonia vaccine    [facility]  [facility] [address] [place]Phone: [phone]: [phone] [id number]Phone: [phone]: [phone]  Uses pill box? Yes Pt endorses 100% compliance  We discussed: Current pharmacy is preferred with insurance plan and patient is satisfied with pharmacy services Patient decided to: Continue current medication management strategy  Care Plan and Follow Up Patient Decision:  Patient agrees to Care Plan and Follow-up.  Plan: Telephone follow up appointment with care management team member scheduled for:  3 months  DTomasa Blase PharmD Clinical Pharmacist, [place]

## 2021-07-08 DIAGNOSIS — I10 Essential (primary) hypertension: Secondary | ICD-10-CM

## 2021-07-08 DIAGNOSIS — E669 Obesity, unspecified: Secondary | ICD-10-CM

## 2021-07-08 DIAGNOSIS — E1169 Type 2 diabetes mellitus with other specified complication: Secondary | ICD-10-CM

## 2021-07-08 DIAGNOSIS — E785 Hyperlipidemia, unspecified: Secondary | ICD-10-CM

## 2021-07-15 ENCOUNTER — Telehealth: Payer: Self-pay | Admitting: Internal Medicine

## 2021-07-15 NOTE — Telephone Encounter (Signed)
N/A unable to leave a message for patient to call back to schedule Medicare Annual Wellness Visit  ° °Last AWV  07/22/20 ° °Please schedule at anytime with [facility]-Nurse Health Advisor if patient calls the office back.   ° °40 Minutes appointment  ° °Any questions, please call me at [phone]  °

## 2021-07-16 DIAGNOSIS — E291 Testicular hypofunction: Secondary | ICD-10-CM | POA: Diagnosis not present

## 2021-07-16 DIAGNOSIS — R948 Abnormal results of function studies of other organs and systems: Secondary | ICD-10-CM | POA: Diagnosis not present

## 2021-07-22 ENCOUNTER — Other Ambulatory Visit: Payer: Self-pay

## 2021-07-22 ENCOUNTER — Ambulatory Visit: Payer: Medicare Other | Admitting: Endocrinology

## 2021-07-22 VITALS — BP 140/80 | HR 73 | Ht 75.0 in | Wt 267.2 lb

## 2021-07-22 DIAGNOSIS — E669 Obesity, unspecified: Secondary | ICD-10-CM

## 2021-07-22 DIAGNOSIS — E1169 Type 2 diabetes mellitus with other specified complication: Secondary | ICD-10-CM

## 2021-07-22 LAB — POCT GLYCOSYLATED HEMOGLOBIN (HGB A1C): Hemoglobin A1C: 7.4 % — AB (ref 4.0–5.6)

## 2021-07-22 MED ORDER — RYBELSUS 3 MG PO TABS: 3.0000 mg | ORAL_TABLET | Freq: Every day | ORAL | 11 refills | Status: DC

## 2021-07-22 MED ORDER — FREESTYLE LIBRE 2 SENSOR MISC: 1.0000 | 3 refills | Status: DC

## 2021-07-22 NOTE — Patient Instructions (Addendum)
I have sent a prescription to your pharmacy, to add Rybelsus.   Please continue the same Synjardy.   check your blood sugar once a day.  vary the time of day when you check, between before the 3 meals, and at bedtime.  also check if you have symptoms of your blood sugar being too high or too low.  please keep a record of the readings and bring it to your next appointment here (or you can bring the meter itself).  You can write it on any piece of paper.  please call us sooner if your blood sugar goes below 70, or if you have a lot of readings over 200.   Please come back for a follow-up appointment in 2-3 months.

## 2021-07-22 NOTE — Progress Notes (Signed)
Subjective:    Patient ID: [name], male    DOB: 03-May-1971, 51 y.o.   MRN: [id number]  HPI Pt returns for f/u of diabetes mellitus:  DM type: 2 Dx'ed: 2008 Complications: CAD Therapy: Synjardy    DKA: never Severe hypoglycemia: never.  Pancreatitis: 2019 (GB) Pancreatic imaging: 2011 CT: mild fatty replacement in the head and uncinate process.   Other: he has never been on insulin; He stopped pioglitazone, due to weight gain; he did not tolerate repaglinide (nausea) SDOH: he takes Synjardy from pt assist; He stopped nateglinide.  He says this was on the advice of cardiol.  He is certain that cardiol did not mean pioglitazone.   Interval history: No recent steroids.  no cbg record, but states cbg's vary from 160-170.   Past Medical History:  Diagnosis Date   Anginal pain (HCC)    Arthritis    ? 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 Followed by Dr. Nickola Major   Arthritis    "ankles" (04/13/2016)   Asthma    CORONARY ARTERY DISEASE    a. cath 05/2014: mild LAD disease, normal LCx and RCA. Risk factor modification recommended.   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)    MSLT 01/09/09 & MRI brain 01/09/09   Pericarditis    recurrent   POLYNEUROPATHY    PULMONARY SARCOIDOSIS    Mediastinal lymphadenopathy with biospy proven sarcodosis   Seizures (HCC)    "none in 4-5 years; don't know what kind; not related to alcohol" (04/13/2016)   TESTICULAR HYPOFUNCTION    TIA (transient ischemic attack)    "I don't remember when" (04/13/2016)    Past Surgical History:  Procedure Laterality Date   BRONCHOSCOPY  08/21/08   CARDIAC CATHETERIZATION  06/25/2009   minimal disease   CARDIAC CATHETERIZATION N/A 04/14/2016   Procedure: Left Heart Cath and Coronary Angiography;  Surgeon: Kathleene Hazel, MD;  Location[facility] INVASIVE  CV LAB;  Service: Cardiovascular;  Laterality: N/A;   CHOLECYSTECTOMY N/A 09/21/2017   Procedure: LAPAROSCOPIC CHOLECYSTECTOMY WITH INTRAOPERATIVE CHOLANGIOGRAM;  Surgeon: Glenna Fellows, MD;  Location: [facility] ORS;  Service: General;  Laterality: N/A;   LEFT HEART CATHETERIZATION WITH CORONARY ANGIOGRAM N/A 05/18/2014   Procedure: LEFT HEART CATHETERIZATION WITH CORONARY ANGIOGRAM;  Surgeon: Corky Crafts, MD;  Location: [facility] CATH LAB;  Service: Cardiovascular;  Laterality: N/A;   MEDIASTINOSCOPY  11/30/08    Social History   Socioeconomic History   Marital status: Significant Other    Spouse name: Not on file   Number of children: 3   Years of education: Not on file   Highest education level: Not on file  Occupational History   Occupation: Unemployed  Tobacco Use   Smoking status: Former    Packs/day: 1.00    Years: 10.00    Pack years: 10.00    Types: Cigarettes    Quit date: 05/18/2009    Years since quitting: 12.1   Smokeless tobacco: Never  Vaping Use   Vaping Use: Never used  Substance and Sexual Activity   Alcohol use: Yes    Comment: rare   Drug use: No   Sexual activity: Yes  Other Topics Concern   Not on file  Social History Narrative   Married, lives with wife and 3 kids.  Currently student. 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 [employer]   Social Determinants of Corporate investment banker Strain: Low Risk    Difficulty of Paying Living Expenses: Not hard at [employer] Insecurity: No Food Insecurity   Worried About Programme researcher, broadcasting/film/video in the Last Year: Never true   Barista in the Last Year: Never true  Transportation Needs: No Transportation Needs   Lack of Transportation (Medical): No   Lack of Transportation (Non-Medical): No  Physical Activity: Inactive   Days of Exercise per Week: 0 days   Minutes of Exercise per Session: 0 min  Stress: No Stress Concern Present   Feeling of Stress : Not at all  Social Connections: Socially Integrated    Frequency of Communication with Friends and Family: More than three times a week   Frequency of Social Gatherings with Friends and Family: More than three times a week   Attends Religious Services: More than 4 times per year   Active Member of [employer] or Organizations: No   Attends Engineer, structural: More than 4 times per year   Marital Status: Living with partner  Intimate Partner Violence: Not on file    Current Outpatient Medications on File Prior to Visit  Medication Sig Dispense Refill   albuterol (VENTOLIN HFA) 108 (90 Base) MCG/ACT inhaler Inhale 2 puffs into the lungs every 6 (six) hours as needed for wheezing or shortness of breath. 1 each 3   allopurinol (ZYLOPRIM) 300 MG tablet TAKE 1 TABLET(300 MG) BY MOUTH DAILY 90 tablet 1   aspirin EC 81 MG tablet Take 1 tablet (81 mg total) by mouth daily. 30 tablet 3   cetirizine (ZYRTEC) 10 MG tablet Take 10 mg by mouth daily.     cholecalciferol (VITAMIN D) 1000 units tablet Take 1,000 Units by mouth daily.     clomiPHENE (CLOMID) 50 MG tablet Take 50 mg by mouth every other day.     colchicine 0.6 MG tablet Take 1 tablet (0.6 mg total) by mouth 2 (two) times daily. 180 tablet 0   docusate sodium (COLACE) 100 MG capsule Take 100 mg by mouth daily.     Empagliflozin-metFORMIN HCl ER (SYNJARDY XR) 12.10-998 MG TB24 Take 2 tablets by mouth daily. 180 tablet 3   fluticasone (FLONASE) 50 MCG/ACT nasal spray Place 2 sprays into both nostrils daily. 16 g 6   folic acid (FOLVITE) 1 MG tablet Take 1 mg by mouth daily.  1   glucose blood (ONETOUCH VERIO) test strip 1 each by Other route daily. And lancets 1/day (Patient taking differently: 1 each by Other route daily.) 100 each 11   metoprolol succinate (TOPROL-XL) 50 MG 24 hr tablet Take 50 mg by mouth daily. Take with or immediately following a meal.     nitroGLYCERIN (NITROSTAT) 0.4 MG SL tablet Place 1 tablet (0.4 mg total) under the tongue every 5 (five) minutes as needed for chest  pain. 25 tablet 3   pantoprazole (PROTONIX) 40 MG tablet TAKE 2 TABLETS(80 MG) BY MOUTH DAILY 180 tablet 0   rosuvastatin (CRESTOR) 40 MG tablet TAKE 1 TABLET(40 MG) BY MOUTH DAILY 90 tablet 1   sacubitril-valsartan (ENTRESTO) 49-51 MG Take 1 tablet by mouth 2 (two) times daily. Via N[employer]ART[employer] pt assistance     saw palmetto 500 MG capsule Take 500 mg by mouth daily.     tadalafil (CIALIS) 20 MG tablet TAKE ONE TABLET BY MOUTH DAILY AS NEEDED 20  tablet 11   Zinc Sulfate (ZINC 15 PO) Take 1 tablet by mouth daily.     No current facility-administered medications on file prior to visit.    Allergies  Allergen Reactions   Amlodipine Other (See Comments)    Ankle swelling with 10mg , ok with 5mg  dose   Sulfamethoxazole Rash    fever   Sulfonamide Derivatives Rash and Other (See Comments)    Family History  Problem Relation Age of Onset   Diabetes Mother    Hypertension Mother    Colon polyps Mother    Diabetes Father    Heart disease Father 25       Fatal MI   Heart failure Father    Colon polyps Father    Pancreatic cancer Maternal Grandmother    Stomach cancer Maternal Uncle    Pancreatic cancer Maternal Uncle    Colon cancer Neg Hx    Esophageal cancer Neg Hx    Rectal cancer Neg Hx    Liver disease Neg Hx     BP 140/80    Pulse 73    Ht 6\' 3"  (1.905 m)    Wt 267 lb 3.2 oz (121.2 kg)    SpO2 97%    BMI 33.40 kg/m    Review of Systems     Objective:   Physical Exam VITAL SIGNS:  See vs page GENERAL: no distress EXT: no leg edema.   Lab Results  Component Value Date   HGBA1C 7.4 (A) 07/22/2021      Assessment & Plan:  Type 2 DM: uncontrolled.    Patient Instructions  I have sent a prescription to your pharmacy, to add Rybelsus.   Please continue the same Synjardy.   check your blood sugar once a day.  vary the time of day when you check, between before the 3 meals, and at bedtime.  also check if you have symptoms of your blood sugar being too high or too low.   please keep a record of the readings and bring it to your next appointment here (or you can bring the meter itself).  You can write it on any piece of paper.  please call us sooner if your blood sugar goes below 70, or if you have a lot of readings over 200.   Please come back for a follow-up appointment in 2-3 months.

## 2021-07-23 ENCOUNTER — Ambulatory Visit: Payer: Medicare Other | Admitting: Endocrinology

## 2021-07-23 DIAGNOSIS — N5201 Erectile dysfunction due to arterial insufficiency: Secondary | ICD-10-CM | POA: Diagnosis not present

## 2021-07-23 DIAGNOSIS — E291 Testicular hypofunction: Secondary | ICD-10-CM | POA: Diagnosis not present

## 2021-07-28 DIAGNOSIS — S83242A Other tear of medial meniscus, current injury, left knee, initial encounter: Secondary | ICD-10-CM | POA: Diagnosis not present

## 2021-08-22 ENCOUNTER — Other Ambulatory Visit: Payer: Self-pay | Admitting: Endocrinology

## 2021-08-22 ENCOUNTER — Telehealth: Payer: Self-pay

## 2021-08-22 MED ORDER — SITAGLIPTIN PHOSPHATE 100 MG PO TABS: 100.0000 mg | ORAL_TABLET | Freq: Every day | ORAL | 3 refills | Status: DC

## 2021-08-22 NOTE — Telephone Encounter (Signed)
Patient left a voicemail regarding Rybelsus says that it cost a bit too much for him and would like to know if there's an alterntive. Please advise ?

## 2021-08-22 NOTE — Telephone Encounter (Signed)
Patient now informed. 

## 2021-09-05 ENCOUNTER — Other Ambulatory Visit: Payer: Self-pay | Admitting: Urology

## 2021-09-11 ENCOUNTER — Ambulatory Visit (INDEPENDENT_AMBULATORY_CARE_PROVIDER_SITE_OTHER): Payer: Medicare Other

## 2021-09-11 DIAGNOSIS — E1169 Type 2 diabetes mellitus with other specified complication: Secondary | ICD-10-CM

## 2021-09-11 DIAGNOSIS — I25118 Atherosclerotic heart disease of native coronary artery with other forms of angina pectoris: Secondary | ICD-10-CM

## 2021-09-11 DIAGNOSIS — E785 Hyperlipidemia, unspecified: Secondary | ICD-10-CM

## 2021-09-11 DIAGNOSIS — M1A09X0 Idiopathic chronic gout, multiple sites, without tophus (tophi): Secondary | ICD-10-CM

## 2021-09-11 DIAGNOSIS — I10 Essential (primary) hypertension: Secondary | ICD-10-CM

## 2021-09-11 NOTE — Progress Notes (Signed)
? ?Chronic Care Management ?Pharmacy Note ? ?09/11/2021 ?Name:  [name] MRN:  [id number] DOB:  [birth year] ? ?Summary: ?-Patient reports that he had previously been taking rybelsus, was very costly so he had to stop, [place] was never able to be picked up due to cost  ?-Reviewed with patient requirements for patient assistance, per patient would not qualify due to current income  ?-BG at home in AM typically averaging 140-145, after meals, averaging 180-200 ?-Patient reports that he has cut back on amount of sugary drinks, but notes he does have gatorade on occasion  ?-Denies any issues with current medications ? ?Recommendations/Changes made from today's visit: ?-Patient to make changes to diet to help improve BG and bring A1c to goal agreeable to start drinking carb free drinks - continue to monitor blood sugars  ?-Advised for patient to reach out to endo regarding cost of januvia and being unable to start medication - would not qualify for PAP ? ?Subjective: ?[name] is an 51 y.o. year old male who is a primary patient of Janith Lima, MD.  The CCM team was consulted for assistance with disease management and care coordination needs.   ? ?Engaged with patient by telephone for follow up visit in response to provider referral for pharmacy case management and/or care coordination services.  ? ?Consent to Services:  ?The patient was given information about Chronic Care Management services, agreed to services, and gave verbal consent prior to initiation of services.  Please see initial visit note for detailed documentation.  ? ?Patient Care Team: ?Janith Lima, MD as PCP - General (Internal Medicine) ?Dorothy Spark, MD as PCP - Cardiology (Cardiology) ?Kittie Plater as Consulting Physician (Rheumatology) ?Mosetta Anis, MD as Referring Physician (Rheumatology) ?Chesley Mires, MD as Consulting Physician (Pulmonary Disease) ?Renato Shin, MD as Consulting Physician  (Endocrinology) ?Dorothy Spark, MD (Cardiology) ?Webb Laws, OD as Referring Physician (Optometry) ?Tomasa Blase, [facility] as Pharmacist (Pharmacist) ? ?Recent office visits: ?None since last visit  ? ?Recent consult visits: ?07/22/2021 - Dr. Loanne Drilling - Endocrinology - rybelsus 69m rx'd  - unable to start due to cost - jTongarx'd  ? ?Hospital visits: ?None in previous 6 months  ? ?Objective: ? ?Lab Results  ?Component Value Date  ? CREATININE 0.89 12/05/2020  ? BUN 12 12/05/2020  ? GFR 101.70 12/05/2020  ? GFRNONAA >60 02/11/2020  ? GFRAA >60 02/11/2020  ? NA 142 12/05/2020  ? K 3.8 12/05/2020  ? CALCIUM 9.2 12/05/2020  ? CO2 27 12/05/2020  ? ? ?Lab Results  ?Component Value Date/Time  ? HGBA1C 7.4 (A) 07/22/2021 07:44 AM  ? HGBA1C 7.6 (A) 04/22/2021 07:45 AM  ? HGBA1C 7.9 (H) 12/05/2020 03:02 PM  ? HGBA1C 6.8 (H) 08/29/2019 08:47 AM  ? GFR 101.70 12/05/2020 03:02 PM  ? GFR 102.00 07/05/2020 09:17 AM  ? MICROALBUR 4.7 (H) 12/05/2020 03:02 PM  ? MICROALBUR 1.0 08/29/2019 08:47 AM  ?  ?Last diabetic Eye exam:  ?Lab Results  ?Component Value Date/Time  ? HMDIABEYEEXA No Retinopathy 11/05/2020 12:00 AM  ?  ?Last diabetic Foot exam:  ?Lab Results  ?Component Value Date/Time  ? HMDIABFOOTEX Done 04/10/2010 12:00 AM  ? ? ?Lab Results  ?Component Value Date  ? CHOL 98 12/05/2020  ? HDL 32.20 (L) 12/05/2020  ? LSpencer42 12/05/2020  ? TRIG 115.0 12/05/2020  ? CHOLHDL 3 12/05/2020  ? ? ? ?  Latest Ref Rng & Units 07/05/2020  ?  9:17 AM 01/09/2020  ?  8:11 AM 08/29/2019  ?  8:47 AM  ?Hepatic Function  ?Total Protein 6.0 - 8.3 g/dL 8.1   7.6   7.5    ?Albumin 3.5 - 5.2 g/dL 4.5   4.0   4.2    ?AST 0 - 37 U/L _0 ?ALT 0 - 53 U/L _1 ?Alk Phosphatase 39 - 117 U/L 89   77   84    ?Total Bilirubin 0.2 - 1.2 mg/dL 0.4   0.3   0.4    ?Bilirubin, Direct 0.0 - 0.3 mg/dL   0.1    ? ? ?Lab Results  ?Component Value Date/Time  ? TSH 0.62 06/14/2018 03:56 PM  ? TSH 0.66 03/01/2017 12:05 PM  ? FREET4 0.7  11/13/2008 11:12 AM  ? ? ? ?  Latest Ref Rng & Units 07/05/2020  ?  9:17 AM 02/11/2020  ?  7:54 PM 01/09/2020  ?  8:11 AM  ?CBC  ?WBC 4.0 - 10.5 K/uL 6.1   8.5   6.5    ?Hemoglobin 13.0 - 17.0 g/dL 14.4   15.2   14.3    ?Hematocrit 39.0 - 52.0 % 42.7   45.0   41.7    ?Platelets 150.0 - 400.0 K/uL 269.0   249   241    ? ? ?No results found for: VD25OH ? ?Clinical ASCVD: No  ?The ASCVD Risk score (Arnett DK, et al., 2019) failed to calculate for the following reasons: ?  The valid total cholesterol range is 130 to 320 mg/dL   ? ? ?  07/22/2020  ?  8:43 AM 08/29/2019  ?  8:02 AM 06/14/2018  ?  3:56 PM  ?Depression screen PHQ 2/9  ?Decreased Interest 0 0 0  ?Down, Depressed, Hopeless 0 0 0  ?PHQ - 2 Score 0 0 0  ?Altered sleeping  1   ?Tired, decreased energy  0   ?Change in appetite  0   ?Feeling bad or failure about yourself   0   ?Trouble concentrating  0   ?Moving slowly or fidgety/restless  0   ?Suicidal thoughts  0   ?PHQ-9 Score  1   ?Difficult doing work/chores  Not difficult at all   ?  ? ?Social History  ? ?Tobacco Use  ?Smoking Status Former  ? Packs/day: 1.00  ? Years: 10.00  ? Pack years: 10.00  ? Types: Cigarettes  ? Quit date: 05/18/2009  ? Years since quitting: 12.3  ?Smokeless Tobacco Never  ? ?BP Readings from Last 3 Encounters:  ?07/22/21 140/80  ?04/22/21 120/70  ?02/18/21 120/70  ? ?Pulse Readings from Last 3 Encounters:  ?07/22/21 73  ?04/22/21 76  ?02/18/21 74  ? ?Wt Readings from Last 3 Encounters:  ?07/22/21 267 lb 3.2 oz (121.2 kg)  ?04/22/21 264 lb 6.4 oz (119.9 kg)  ?02/18/21 269 lb 9.6 oz (122.3 kg)  ? ? ?Assessment/Interventions: Review of patient past medical history, allergies, medications, health status, including review of consultants reports, laboratory and other test data, was performed as part of comprehensive evaluation and provision of chronic care management services.  ? ?SDOH:  (Social Determinants of Health) assessments and interventions performed: Yes ? ? ?[place] ? ?Allergies   ?Allergen Reactions  ? Amlodipine Other (See Comments)  ?  Ankle swelling with 33m, ok with 524mdose  ? Sulfamethoxazole Rash  ?  fever  ? Sulfonamide Derivatives Rash  and Other (See Comments)  ? ? ?Medications Reviewed Today   ? ? Reviewed by Tomasa Blase, [facility] (Pharmacist) on 09/11/21 at [place] List Status: <None>  ? ?Medication Order Taking? Sig Documenting Provider Last Dose Status Informant  ?albuterol (VENTOLIN HFA) 108 (90 Base) MCG/ACT inhaler [id number] Yes Inhale 2 puffs into the lungs every 6 (six) hours as needed for wheezing or shortness of breath. Janith Lima, MD Taking Active   ?allopurinol (ZYLOPRIM) 300 MG tablet [id number] Yes TAKE 1 TABLET(300 MG) BY MOUTH DAILY Janith Lima, MD Taking Active   ?aspirin EC 81 MG tablet [id number] Yes Take 1 tablet (81 mg total) by mouth daily. Rowe Clack, MD Taking Active Self  ?cetirizine (ZYRTEC) 10 MG tablet [id number] Yes Take 10 mg by mouth daily. [provider] Taking Active   ?cholecalciferol (VITAMIN D) 1000 units tablet [id number] Yes Take 1,000 Units by mouth daily. [provider] Taking Active Self  ?clomiPHENE (CLOMID) 50 MG tablet [id number] Yes Take 50 mg by mouth every other day. [provider] Taking Active   ?colchicine 0.6 MG tablet [id number] Yes Take 1 tablet (0.6 mg total) by mouth 2 (two) times daily. Janith Lima, MD Taking Active   ?Continuous Blood Gluc Sensor (FREESTYLE LIBRE 2 SENSOR) MISC [id number]  1 Device by Does not apply route every 14 (fourteen) days. Renato Shin, MD  Active   ?docusate sodium (COLACE) 100 MG capsule [id number] Yes Take 100 mg by mouth daily. [provider] Taking Active   ?Empagliflozin-metFORMIN HCl ER (SYNJARDY XR) 12.10-998 MG TB24 [id number] Yes Take 2 tablets by mouth daily. Renato Shin, MD Taking Active   ?         ?Med Note Charlton Haws   Thu Feb 13, 2021  9:22 AM) Via [employer] pt assistance  ?fluticasone (FLONASE) 50 MCG/ACT nasal spray  [id number] Yes Place 2 sprays into both nostrils daily. Marrian Salvage, FNP Taking Active   ?folic acid (FOLVITE) 1 MG tablet [id number] Yes Take 1 mg by mouth daily. [provider] Taking Active Self  ?         ?M

## 2021-09-11 NOTE — Patient Instructions (Signed)
Visit Information ? ?Following are the goals we discussed today:  ? ?Manage My Medicine  ? ?Timeframe:  Long-Range Goal ?Priority:  Medium ?Start Date:    11/13/20                         ?Expected End Date:   06/11/2022                 ? ?Follow Up Date August 2023 ?  ?- call for medicine refill 2 or 3 days before it runs out ?- call if I am sick and can't take my medicine ?- keep a list of all the medicines I take; vitamins and herbals too ?-make endo office aware that Alma Friendly was too costly to start  ?  ?Why is this important?   ?These steps will help you keep on track with your medicines. ? ?Plan: Telephone follow up appointment with care management team member scheduled for:  4 months ?The patient has been provided with contact information for the care management team and has been advised to call with any health related questions or concerns.  ? ?Ellin Saba, PharmD ?Clinical Pharmacist, [place] [facility]  ? ?Please call the care guide team at [phone] if you need to cancel or reschedule your appointment.  ? ?Patient verbalizes understanding of instructions and care plan provided today and agrees to view in MyChart. Active MyChart status confirmed with patient.   ? ?

## 2021-09-15 ENCOUNTER — Ambulatory Visit: Payer: Medicare Other | Admitting: Internal Medicine

## 2021-09-15 DIAGNOSIS — F17290 Nicotine dependence, other tobacco product, uncomplicated: Secondary | ICD-10-CM | POA: Diagnosis not present

## 2021-09-15 DIAGNOSIS — I10 Essential (primary) hypertension: Secondary | ICD-10-CM | POA: Diagnosis not present

## 2021-09-15 DIAGNOSIS — E119 Type 2 diabetes mellitus without complications: Secondary | ICD-10-CM | POA: Diagnosis not present

## 2021-09-15 DIAGNOSIS — E785 Hyperlipidemia, unspecified: Secondary | ICD-10-CM | POA: Diagnosis not present

## 2021-09-19 ENCOUNTER — Ambulatory Visit: Payer: Medicare Other | Admitting: Internal Medicine

## 2021-10-02 ENCOUNTER — Other Ambulatory Visit: Payer: Self-pay | Admitting: Internal Medicine

## 2021-10-02 DIAGNOSIS — E785 Hyperlipidemia, unspecified: Secondary | ICD-10-CM

## 2021-10-02 DIAGNOSIS — I2510 Atherosclerotic heart disease of native coronary artery without angina pectoris: Secondary | ICD-10-CM

## 2021-10-03 DIAGNOSIS — N411 Chronic prostatitis: Secondary | ICD-10-CM | POA: Diagnosis not present

## 2021-10-05 DIAGNOSIS — E1169 Type 2 diabetes mellitus with other specified complication: Secondary | ICD-10-CM | POA: Diagnosis not present

## 2021-10-05 DIAGNOSIS — E785 Hyperlipidemia, unspecified: Secondary | ICD-10-CM

## 2021-10-05 DIAGNOSIS — I110 Hypertensive heart disease with heart failure: Secondary | ICD-10-CM

## 2021-10-05 DIAGNOSIS — I25118 Atherosclerotic heart disease of native coronary artery with other forms of angina pectoris: Secondary | ICD-10-CM

## 2021-10-09 ENCOUNTER — Other Ambulatory Visit: Payer: Self-pay | Admitting: Internal Medicine

## 2021-10-09 DIAGNOSIS — K2100 Gastro-esophageal reflux disease with esophagitis, without bleeding: Secondary | ICD-10-CM

## 2021-10-14 ENCOUNTER — Ambulatory Visit: Payer: Medicare Other | Admitting: Endocrinology

## 2021-10-20 ENCOUNTER — Emergency Department (HOSPITAL_BASED_OUTPATIENT_CLINIC_OR_DEPARTMENT_OTHER)
Admission: EM | Admit: 2021-10-20 | Discharge: 2021-10-20 | Disposition: A | Discharge status: Home / Self Care | Payer: Medicare Other | Attending: Emergency Medicine | Admitting: Emergency Medicine

## 2021-10-20 ENCOUNTER — Ambulatory Visit (HOSPITAL_BASED_OUTPATIENT_CLINIC_OR_DEPARTMENT_OTHER)
Admission: RE | Admit: 2021-10-20 | Discharge: 2021-10-20 | Disposition: A | Discharge status: Home / Self Care | Payer: Medicare Other | Source: Ambulatory Visit | Attending: Emergency Medicine | Admitting: Emergency Medicine

## 2021-10-20 ENCOUNTER — Other Ambulatory Visit: Payer: Self-pay

## 2021-10-20 ENCOUNTER — Encounter (HOSPITAL_BASED_OUTPATIENT_CLINIC_OR_DEPARTMENT_OTHER): Payer: Self-pay | Admitting: Emergency Medicine

## 2021-10-20 ENCOUNTER — Emergency Department (HOSPITAL_BASED_OUTPATIENT_CLINIC_OR_DEPARTMENT_OTHER): Admission: RE | Admit: 2021-10-20 | Payer: Medicare Other | Source: Ambulatory Visit | Admitting: Emergency Medicine

## 2021-10-20 DIAGNOSIS — M7989 Other specified soft tissue disorders: Secondary | ICD-10-CM | POA: Diagnosis not present

## 2021-10-20 DIAGNOSIS — R112 Nausea with vomiting, unspecified: Secondary | ICD-10-CM | POA: Diagnosis not present

## 2021-10-20 DIAGNOSIS — J45909 Unspecified asthma, uncomplicated: Secondary | ICD-10-CM | POA: Diagnosis not present

## 2021-10-20 DIAGNOSIS — I509 Heart failure, unspecified: Secondary | ICD-10-CM | POA: Diagnosis not present

## 2021-10-20 DIAGNOSIS — R2242 Localized swelling, mass and lump, left lower limb: Secondary | ICD-10-CM | POA: Diagnosis not present

## 2021-10-20 DIAGNOSIS — Z87891 Personal history of nicotine dependence: Secondary | ICD-10-CM | POA: Insufficient documentation

## 2021-10-20 DIAGNOSIS — M79662 Pain in left lower leg: Secondary | ICD-10-CM | POA: Insufficient documentation

## 2021-10-20 DIAGNOSIS — M79605 Pain in left leg: Secondary | ICD-10-CM | POA: Diagnosis not present

## 2021-10-20 DIAGNOSIS — I2510 Atherosclerotic heart disease of native coronary artery without angina pectoris: Secondary | ICD-10-CM | POA: Diagnosis not present

## 2021-10-20 DIAGNOSIS — R42 Dizziness and giddiness: Secondary | ICD-10-CM | POA: Diagnosis not present

## 2021-10-20 DIAGNOSIS — I110 Hypertensive heart disease with heart failure: Secondary | ICD-10-CM | POA: Diagnosis not present

## 2021-10-20 DIAGNOSIS — E119 Type 2 diabetes mellitus without complications: Secondary | ICD-10-CM | POA: Diagnosis not present

## 2021-10-20 DIAGNOSIS — Z7982 Long term (current) use of aspirin: Secondary | ICD-10-CM | POA: Insufficient documentation

## 2021-10-20 DIAGNOSIS — R600 Localized edema: Secondary | ICD-10-CM | POA: Insufficient documentation

## 2021-10-20 DIAGNOSIS — Z7951 Long term (current) use of inhaled steroids: Secondary | ICD-10-CM | POA: Diagnosis not present

## 2021-10-20 DIAGNOSIS — Z79899 Other long term (current) drug therapy: Secondary | ICD-10-CM | POA: Diagnosis not present

## 2021-10-20 LAB — CBC WITH DIFFERENTIAL/PLATELET
Abs Immature Granulocytes: 0 10*3/uL (ref 0.00–0.07)
Basophils Absolute: 0 10*3/uL (ref 0.0–0.1)
Basophils Relative: 1 %
Eosinophils Absolute: 0.2 10*3/uL (ref 0.0–0.5)
Eosinophils Relative: 6 %
HCT: 41.5 % (ref 39.0–52.0)
Hemoglobin: 14.4 g/dL (ref 13.0–17.0)
Immature Granulocytes: 0 %
Lymphocytes Relative: 43 %
Lymphs Abs: 1.6 10*3/uL (ref 0.7–4.0)
MCH: 26.4 pg (ref 26.0–34.0)
MCHC: 34.7 g/dL (ref 30.0–36.0)
MCV: 76 fL — ABNORMAL LOW (ref 80.0–100.0)
Monocytes Absolute: 0.5 10*3/uL (ref 0.1–1.0)
Monocytes Relative: 12 %
Neutro Abs: 1.4 10*3/uL — ABNORMAL LOW (ref 1.7–7.7)
Neutrophils Relative %: 38 %
Platelets: 212 10*3/uL (ref 150–400)
RBC: 5.46 MIL/uL (ref 4.22–5.81)
RDW: 15 % (ref 11.5–15.5)
WBC: 3.7 10*3/uL — ABNORMAL LOW (ref 4.0–10.5)
nRBC: 0 % (ref 0.0–0.2)

## 2021-10-20 LAB — D-DIMER, QUANTITATIVE: D-Dimer, Quant: 0.37 ug/mL-FEU (ref 0.00–0.50)

## 2021-10-20 LAB — BASIC METABOLIC PANEL
Anion gap: 5 (ref 5–15)
BUN: 13 mg/dL (ref 6–20)
CO2: 27 mmol/L (ref 22–32)
Calcium: 8.6 mg/dL — ABNORMAL LOW (ref 8.9–10.3)
Chloride: 108 mmol/L (ref 98–111)
Creatinine, Ser: 0.77 mg/dL (ref 0.61–1.24)
GFR, Estimated: >60 mL/min (ref >60)
Glucose, Bld: 166 mg/dL — ABNORMAL HIGH (ref 70–99)
Potassium: 3.8 mmol/L (ref 3.5–5.1)
Sodium: 140 mmol/L (ref 135–145)

## 2021-10-20 MED ORDER — DIPHENHYDRAMINE HCL 50 MG/ML IJ SOLN
25.0000 mg | Freq: Once | INTRAMUSCULAR | Status: AC
  Administered 2021-10-20: 25 mg via INTRAVENOUS
  Filled 2021-10-20: qty 1

## 2021-10-20 MED ORDER — ONDANSETRON HCL 4 MG/2ML IJ SOLN
4.0000 mg | Freq: Once | INTRAMUSCULAR | Status: AC
  Administered 2021-10-20: 4 mg via INTRAVENOUS
  Filled 2021-10-20: qty 2

## 2021-10-20 NOTE — ED Notes (Signed)
Pt is resting and is going to wait for an ultrasound of his left calf later this morning. ?

## 2021-10-20 NOTE — ED Triage Notes (Signed)
Reports left calf pain that started yesterday and vomiting during the night.  Also reports he feels dizzy off and on since yesterday. ?

## 2021-10-20 NOTE — ED Provider Notes (Signed)
? ?[facility] ?Provider Note: Lowella DellJ. Lane Darrel Gloss, MD, FACEP ? ?CSN: [id number] ?MRN: [id number] ?ARRIVAL: 10/20/21 at 0428 ?ROOM: [facility]02/[facility]02 ? ? ?CHIEF COMPLAINT  ?Leg Swelling ? ? ?HISTORY OF PRESENT ILLNESS  ?10/20/21 4:41 AM ?[name] is a 51 y.o. male with multiple medical problems including diabetes, coronary artery disease and congestive heart failure.  He is here with about 3 days of discomfort and swelling in his left calf.  The swelling is not severe but the calf feels tight.  He rates associated discomfort as a 5 out of 10, throbbing in nature.  It is worse with palpation or ambulation.  Over the same period of time he has had some intermittent vertigo (sense of room spinning), especially when he stands up.  This is transient and not present currently.  Symptoms have not been severe and have not been associated with nausea and vomiting. He denies any shortness of breath or chest pain.   ? ?Overnight he had nausea and 2 episodes of vomiting.  He does not associate these with the vertigo he had been having.  He has had soft stools but not loose or diarrhea.  He denies abdominal pain. ? ? ?Past Medical History:  ?Diagnosis Date  ? Anginal pain (HCC)   ? Arthritis   ? ? 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 Followed by Dr. Nickola MajorHawkes  ? Arthritis   ? "ankles" (04/13/2016)  ? Asthma   ? CORONARY ARTERY DISEASE   ? a. cath 05/2014: mild LAD disease, normal LCx and RCA. Risk factor modification recommended.  ? DIABETES MELLITUS, TYPE II   ? Edema   ? ERECTILE DYSFUNCTION, ORGANIC   ? GERD   ? Gout, unspecified   ? Heart murmur   ? HYPERLIPIDEMIA   ? HYPERTENSION   ? Narcolepsy without cataplexy(347.00)   ? MSLT 01/09/09 & MRI brain 01/09/09  ? Pericarditis   ? recurrent  ? POLYNEUROPATHY   ? PULMONARY SARCOIDOSIS   ? Mediastinal lymphadenopathy with biospy proven sarcodosis  ? Seizures (HCC)   ? "none in 4-5 years; don't know what kind; not related to alcohol" (04/13/2016)  ? TESTICULAR HYPOFUNCTION    ? TIA (transient ischemic attack)   ? "I don't remember when" (04/13/2016)  ? ? ?Past Surgical History:  ?Procedure Laterality Date  ? BRONCHOSCOPY  08/21/08  ? CARDIAC CATHETERIZATION  06/25/2009  ? minimal disease  ? CARDIAC CATHETERIZATION N/A 04/14/2016  ? Procedure: Left Heart Cath and Coronary Angiography;  Surgeon: Kathleene Hazelhristopher D McAlhany, MD;  [facility];  Service: Cardiovascular;  Laterality: N/A;  ? CHOLECYSTECTOMY N/A 09/21/2017  ? Procedure: LAPAROSCOPIC CHOLECYSTECTOMY WITH INTRAOPERATIVE CHOLANGIOGRAM;  Surgeon: Glenna FellowsHoxworth, Benjamin, MD;  Location: [facility] ORS;  Service: General;  Laterality: N/A;  ? LEFT HEART CATHETERIZATION WITH CORONARY ANGIOGRAM N/A 05/18/2014  ? Procedure: LEFT HEART CATHETERIZATION WITH CORONARY ANGIOGRAM;  Surgeon: Corky CraftsJayadeep S Varanasi, MD;  [facility];  Service: Cardiovascular;  Laterality: N/A;  ? MEDIASTINOSCOPY  11/30/08  ? ? ?Family History  ?Problem Relation Age of Onset  ? Diabetes Mother   ? Hypertension Mother   ? Colon polyps Mother   ? Diabetes Father   ? Heart disease Father [id number]  ?     Fatal MI  ? Heart failure Father   ? Colon polyps Father   ? Pancreatic cancer Maternal Grandmother   ? Stomach cancer Maternal Uncle   ? Pancreatic cancer Maternal Uncle   ? Colon cancer Neg Hx   ?  Esophageal cancer Neg Hx   ? Rectal cancer Neg Hx   ? Liver disease Neg Hx   ? ? ?Social History  ? ?Tobacco Use  ? Smoking status: Former  ?  Packs/day: 1.00  ?  Years: 10.00  ?  Pack years: 10.00  ?  Types: Cigarettes  ?  Quit date: 05/18/2009  ?  Years since quitting: 12.4  ? Smokeless tobacco: Never  ?Vaping Use  ? Vaping Use: Never used  ?Substance Use Topics  ? Alcohol use: Yes  ?  Comment: rare  ? Drug use: No  ? ? ?Prior to Admission medications   ?Medication Sig Start Date End Date Taking? Authorizing Provider  ?albuterol (VENTOLIN HFA) 108 (90 Base) MCG/ACT inhaler Inhale 2 puffs into the lungs every 6 (six) hours as needed for wheezing or shortness of breath.  02/13/21   [name], MD  ?allopurinol (ZYLOPRIM) 300 MG tablet TAKE 1 TABLET(300 MG) BY MOUTH DAILY 05/03/21   [name], MD  ?aspirin EC 81 MG tablet Take 1 tablet (81 mg total) by mouth daily. 04/29/15   Newt Lukes, MD  ?cetirizine (ZYRTEC) 10 MG tablet Take 10 mg by mouth daily.    [provider]  ?cholecalciferol (VITAMIN D) 1000 units tablet Take 1,000 Units by mouth daily.    [provider]  ?clomiPHENE (CLOMID) 50 MG tablet Take 50 mg by mouth every other day. 08/05/19   [provider]  ?colchicine 0.6 MG tablet Take 1 tablet (0.6 mg total) by mouth 2 (two) times daily. 06/12/21   [name], MD  ?Continuous Blood Gluc Sensor (FREESTYLE LIBRE 2 SENSOR) MISC 1 Device by Does not apply route every 14 (fourteen) days. 07/22/21   Romero Belling, MD  ?docusate sodium (COLACE) 100 MG capsule Take 100 mg by mouth daily.    [provider]  ?Empagliflozin-metFORMIN HCl ER (SYNJARDY XR) 12.10-998 MG TB24 Take 2 tablets by mouth daily. 09/30/20   Romero Belling, MD  ?fluticasone Aleda Grana) 50 MCG/ACT nasal spray Place 2 sprays into both nostrils daily. 03/13/20   Olive Bass, FNP  ?folic acid (FOLVITE) 1 MG tablet Take 1 mg by mouth daily. 04/24/16   [provider]  ?glucose blood (ONETOUCH VERIO) test strip 1 each by Other route daily. And lancets 1/day ?Patient taking differently: 1 each by Other route daily. 02/16/18   Romero Belling, MD  ?metoprolol succinate (TOPROL-XL) 50 MG 24 hr tablet Take 50 mg by mouth daily. Take with or immediately following a meal.    [name], MD  ?nitroGLYCERIN (NITROSTAT) 0.4 MG SL tablet Place 1 tablet (0.4 mg total) under the tongue every 5 (five) minutes as needed for chest pain. 06/12/21   [name], MD  ?pantoprazole (PROTONIX) 40 MG tablet TAKE 2 TABLETS(80 MG) BY MOUTH DAILY 10/09/21   [name], MD  ?rosuvastatin (CRESTOR) 40 MG tablet TAKE 1 TABLET(40 MG) BY MOUTH DAILY 10/02/21   [name], MD  ?sacubitril-valsartan (ENTRESTO) 49-51 MG Take 1 tablet by mouth 2 (two) times daily. Via NOVARTIS pt assistance    [name], MD  ?saw palmetto 500 MG capsule Take 500 mg by mouth daily.    [provider]  ?tadalafil (CIALIS) 20 MG tablet TAKE ONE TABLET BY MOUTH DAILY AS NEEDED 09/08/21   McKenzie, Mardene Celeste, MD  ?Zinc Sulfate (ZINC 15 PO) Take 1 tablet by mouth daily.    [provider]  ? ? ?Allergies ?  Amlodipine, Sulfamethoxazole, and Sulfonamide derivatives ? ? ?REVIEW OF SYSTEMS  ?Negative except as noted here or in the History of Present Illness. ? ? ?PHYSICAL EXAMINATION  ?Initial Vital Signs ?Blood pressure (!) 146/101, pulse 85, temperature 97.8 ?F (36.6 ?C), temperature source Oral, resp. rate 18, height  (1.905 m), weight 117 kg, SpO2 97 %. ? ?Examination ?General: Well-developed, well-nourished male in no acute distress; appearance consistent with age of record ?HENT: normocephalic; atraumatic ?Eyes: Normal appearance ?Neck: supple ?Heart: regular rate and rhythm ?Lungs: clear to auscultation bilaterally ?Abdomen: soft; nondistended; nontender; bowel sounds present ?Extremities: No deformity; full range of motion; pulses normal; mild tenderness and trace edema of left calf without erythema or warmth ?Neurologic: Awake, alert and oriented; motor function intact in all extremities and symmetric; no facial droop ?Skin: Warm and dry ?Psychiatric: Normal mood and affect ? ? ?RESULTS  ?Summary of this visit's results, reviewed and interpreted by myself: ? ? EKG Interpretation ? ?Date/Time:    ?Ventricular Rate:    ?PR Interval:    ?QRS Duration:   ?QT Interval:    ?QTC Calculation:   ?R Axis:     ?Text Interpretation:   ?  ? ?  ? ?Laboratory Studies: ?Results for orders placed or performed during the hospital encounter of 10/20/21 (from the past 24 hour(s))  ?CBC with Differential     Status: Abnormal  ? Collection Time: 10/20/21  4:47 AM  ?Result Value Ref Range  ?  WBC 3.7 (L) 4.0 - 10.5 K/uL  ? RBC 5.46 4.22 - 5.81 MIL/uL  ? Hemoglobin 14.4 13.0 - 17.0 g/dL  ? HCT 41.5 39.0 - 52.0 %  ? MCV 76.0 (L) 80.0 - 100.0 fL  ? MCH 26.4 26.0 - 34.0 pg  ? MCHC 34.7 30.0 - 36.0 g/dL

## 2021-10-20 NOTE — Discharge Instructions (Signed)
[facility].  Tell them you were sent from [facility] for an outpatient Doppler ultrasound.   ?

## 2021-10-21 ENCOUNTER — Ambulatory Visit: Payer: Medicare Other | Admitting: Internal Medicine

## 2021-10-21 ENCOUNTER — Encounter: Payer: Self-pay | Admitting: Internal Medicine

## 2021-10-21 VITALS — BP 140/80 | HR 79 | Ht 75.0 in | Wt 267.0 lb

## 2021-10-21 DIAGNOSIS — E785 Hyperlipidemia, unspecified: Secondary | ICD-10-CM

## 2021-10-21 DIAGNOSIS — E1159 Type 2 diabetes mellitus with other circulatory complications: Secondary | ICD-10-CM | POA: Diagnosis not present

## 2021-10-21 DIAGNOSIS — R739 Hyperglycemia, unspecified: Secondary | ICD-10-CM

## 2021-10-21 DIAGNOSIS — E1165 Type 2 diabetes mellitus with hyperglycemia: Secondary | ICD-10-CM | POA: Diagnosis not present

## 2021-10-21 LAB — POCT GLYCOSYLATED HEMOGLOBIN (HGB A1C): Hemoglobin A1C: 7.2 % — AB (ref 4.0–5.6)

## 2021-10-21 MED ORDER — RYBELSUS 7 MG PO TABS: 7.0000 mg | ORAL_TABLET | Freq: Every day | ORAL | 3 refills | Status: DC

## 2021-10-21 MED ORDER — METFORMIN HCL ER 500 MG PO TB24
1000.0000 mg | ORAL_TABLET | Freq: Two times a day (BID) | ORAL | 3 refills | Status: DC
Start: 2021-10-21 — End: 2022-02-19

## 2021-10-21 NOTE — Progress Notes (Signed)
Patient ID: [name], male   DOB: 1970/08/29, 51 y.o.   MRN: [id number] ? ?HPI: ?[name] is a 51 y.o.-year-old male, returning for f/u for DM2, dx in 2008, non-insulin-dependent, uncontrolled, with complications (CAD, h/o TIA, PN, ED). He prev. Saw Dr. Everardo All, last OV 3 months ago. ? ?He went to the emergency room yesterday for left leg swelling -investigation was negative for DVT. ? ?Reviewed HbA1c: ?Lab Results  ?Component Value Date  ? HGBA1C 7.4 (A) 07/22/2021  ? HGBA1C 7.6 (A) 04/22/2021  ? HGBA1C 7.4 (A) 02/18/2021  ? HGBA1C 7.9 (H) 12/05/2020  ? HGBA1C 7.4 (A) 09/30/2020  ? HGBA1C 7.5 (A) 01/10/2020  ? HGBA1C 6.8 (H) 08/29/2019  ? HGBA1C 6.4 (A) 04/24/2019  ? HGBA1C 8.2 (A) 02/20/2019  ? HGBA1C 7.3 (A) 08/08/2018  ? ?Pt is on a regimen of: ?- Synjardy (Empagliflozin-Metformin ER 25-1000 mg) in am - stopped 2/2 coverage gap 3 weeks ago ?- Rybelsus 3 mg daily - started 07/2021 ?He had weight gain with Actos. ?He had nausea with Repaglinide. ?Stopped Nateglinide per cardiology.  ?He has a h/o gall bladder- related pancreatitis 2019. ? ?Pt checks his sugars >4x a day when she is freestyle libre 3 CGM: ? ? ?Lowest sugar was 59 (night); ? hypoglycemia awareness.  ?Highest sugar was 246 (cookout). ? ?Glucometer: One Touch Verio ? ?- no CKD, last BUN/creatinine:  ?Lab Results  ?Component Value Date  ? BUN 13 10/20/2021  ? BUN 12 12/05/2020  ? CREATININE 0.77 10/20/2021  ? CREATININE 0.89 12/05/2020  ?On Entresto. ? ?- + HL; last set of lipids: ?Lab Results  ?Component Value Date  ? CHOL 98 12/05/2020  ? HDL 32.20 (L) 12/05/2020  ? LDLCALC 42 12/05/2020  ? TRIG 115.0 12/05/2020  ? CHOLHDL 3 12/05/2020  ?On Crestor 40 mg daily. ? ?- last eye exam was in 10/2021. No DR.  ? ?- no numbness and tingling in his feet. Last foot exam: 02/2022. ? ?He has a h/o: HTN, gout, pulm. Sarcoidosis, seizures, GERD. Also, recurring pericarditis. ? ?ROS: ?+ see HPI ?No increased urination, blurry vision, nausea, chest  pain. ? ?PE: ?BP 140/80 (BP Location: Left Arm, Patient Position: Sitting, Cuff Size: Large)   Pulse 79   Ht 6\' 3"  (1.905 m)   Wt 267 lb (121.1 kg)   SpO2 98%   BMI 33.37 kg/m?  ?Wt Readings from Last 3 Encounters:  ?10/21/21 267 lb (121.1 kg)  ?10/20/21 258 lb (117 kg)  ?07/22/21 267 lb 3.2 oz (121.2 kg)  ? ?Constitutional: overweight, in NAD ?Eyes: PERRLA, EOMI, no exophthalmos ?ENT: moist mucous membranes, no thyromegaly, no cervical lymphadenopathy ?Cardiovascular: RRR, No MRG, + LLE mild swelling (peri-") ?Respiratory: CTA B ?Musculoskeletal: no deformities, strength intact in all 4 ?Skin: moist, warm, no rashes ?Neurological: no tremor with outstretched hands, DTR normal in all 4 ? ?ASSESSMENT: ?1. DM2, non-insulin-dependent, uncontrolled, with complications ?- CAD ?- h/o TIA ?- PN ?- ED ? ?2. HL ? ?PLAN:  ?1. Patient with long-standing, uncontrolled diabetes, on oral antidiabetic regimen, which became insufficient. Last HbA1c was lower, at 7.4%. ?CGM interpretation: ?-At today's visit, we reviewed his CGM downloads: It appears that 86% of values are in target range (goal >70%), while 40% are higher than 180 (goal <25%), and 0% are lower than 70 (goal <4%).  The calculated average blood sugar is 150. The projected HbA1c for the next 3 months (GMI) is 6.9%. ?-Reviewing the CGM trends, sugars are fluctuating in the normal range,  but closer to the upper limit of normal, especially after breakfast. He mentions that he ran out of [place] almost 2 weeks ago.  He did see more fluctuating sugars since then, with the highest being in the 240s after a cookout.  She tolerates Rybelsus well.  Therefore, for now, since this is affordable, will increase the dose to 7 mg before breakfast.  Since Kirk RuthsSynjardy is not affordable for now, I advised him to start metformin ER instead.  After he is out of the coverage, I am hoping that we can restart an SGLT2 inhibitor. ?- I suggested to:  ?Patient Instructions  ?Please  increase: ?- Rybelsus 7 mg before b'fast ? ?Start: ?- Metformin ER 1000 mg 2x a day with meals ? ?Please return in 4 months. ? ?- At today's visit, HbA1c is improved to 7.2% ?- He is up-to-date with yearly eye exams  ?- he is up-to-date with foot exam ?- Return to clinic in 4 mo  ? ?2.  HL ?- Reviewed latest lipid panel from 11/2020: LDL at goal, HDL low: ?Lab Results  ?Component Value Date  ? CHOL 98 12/05/2020  ? HDL 32.20 (L) 12/05/2020  ? LDLCALC 42 12/05/2020  ? TRIG 115.0 12/05/2020  ? CHOLHDL 3 12/05/2020  ?- Continues Crestor 40 mg daily without side effects. ? ?Carlus Pavlovristina Ralene Gasparyan, MD PhD ?[facility] ? ?

## 2021-10-21 NOTE — Patient Instructions (Addendum)
Please increase: ?- Rybelsus 7 mg before b'fast ? ?Start: ?- Metformin ER 1000 mg 2x a day with meals ? ?Please return in 4 months. ? ?

## 2021-11-04 DIAGNOSIS — H11153 Pinguecula, bilateral: Secondary | ICD-10-CM | POA: Diagnosis not present

## 2021-11-04 DIAGNOSIS — E119 Type 2 diabetes mellitus without complications: Secondary | ICD-10-CM | POA: Diagnosis not present

## 2021-11-04 DIAGNOSIS — H5213 Myopia, bilateral: Secondary | ICD-10-CM | POA: Diagnosis not present

## 2021-11-04 DIAGNOSIS — H25813 Combined forms of age-related cataract, bilateral: Secondary | ICD-10-CM | POA: Diagnosis not present

## 2021-11-04 LAB — HM DIABETES EYE EXAM

## 2021-11-06 ENCOUNTER — Encounter: Payer: Self-pay | Admitting: Internal Medicine

## 2021-12-03 ENCOUNTER — Other Ambulatory Visit: Payer: Self-pay | Admitting: Internal Medicine

## 2021-12-17 ENCOUNTER — Ambulatory Visit: Payer: Medicare Other | Admitting: Internal Medicine

## 2021-12-23 DIAGNOSIS — F17290 Nicotine dependence, other tobacco product, uncomplicated: Secondary | ICD-10-CM | POA: Diagnosis not present

## 2021-12-23 DIAGNOSIS — E119 Type 2 diabetes mellitus without complications: Secondary | ICD-10-CM | POA: Diagnosis not present

## 2021-12-23 DIAGNOSIS — I10 Essential (primary) hypertension: Secondary | ICD-10-CM | POA: Diagnosis not present

## 2021-12-23 DIAGNOSIS — E785 Hyperlipidemia, unspecified: Secondary | ICD-10-CM | POA: Diagnosis not present

## 2021-12-24 DIAGNOSIS — E1142 Type 2 diabetes mellitus with diabetic polyneuropathy: Secondary | ICD-10-CM | POA: Diagnosis not present

## 2021-12-24 DIAGNOSIS — M722 Plantar fascial fibromatosis: Secondary | ICD-10-CM | POA: Diagnosis not present

## 2021-12-24 DIAGNOSIS — M2062 Acquired deformities of toe(s), unspecified, left foot: Secondary | ICD-10-CM | POA: Diagnosis not present

## 2021-12-24 DIAGNOSIS — M2061 Acquired deformities of toe(s), unspecified, right foot: Secondary | ICD-10-CM | POA: Diagnosis not present

## 2022-01-15 ENCOUNTER — Telehealth: Payer: Medicare Other

## 2022-01-15 DIAGNOSIS — E785 Hyperlipidemia, unspecified: Secondary | ICD-10-CM | POA: Diagnosis not present

## 2022-01-15 DIAGNOSIS — E119 Type 2 diabetes mellitus without complications: Secondary | ICD-10-CM | POA: Diagnosis not present

## 2022-01-15 DIAGNOSIS — I10 Essential (primary) hypertension: Secondary | ICD-10-CM | POA: Diagnosis not present

## 2022-01-16 DIAGNOSIS — E291 Testicular hypofunction: Secondary | ICD-10-CM | POA: Diagnosis not present

## 2022-01-16 DIAGNOSIS — R948 Abnormal results of function studies of other organs and systems: Secondary | ICD-10-CM | POA: Diagnosis not present

## 2022-02-06 DIAGNOSIS — E291 Testicular hypofunction: Secondary | ICD-10-CM | POA: Diagnosis not present

## 2022-02-19 ENCOUNTER — Telehealth: Payer: Self-pay | Admitting: Internal Medicine

## 2022-02-19 ENCOUNTER — Other Ambulatory Visit: Payer: Self-pay

## 2022-02-19 DIAGNOSIS — E1165 Type 2 diabetes mellitus with hyperglycemia: Secondary | ICD-10-CM

## 2022-02-19 MED ORDER — METFORMIN HCL ER 500 MG PO TB24: 1000.0000 mg | ORAL_TABLET | Freq: Two times a day (BID) | ORAL | 3 refills | Status: DC

## 2022-02-19 MED ORDER — RYBELSUS 7 MG PO TABS: 7.0000 mg | ORAL_TABLET | Freq: Every day | ORAL | 3 refills | Status: DC

## 2022-02-19 NOTE — Telephone Encounter (Signed)
MEDICATION: metFORMIN metFORMIN (GLUCOPHAGE-XR) 500 MG 24 hr tablet And Semaglutide (RYBELSUS) 7 MG TABS [[id number]]  PHARMACY:  [id number] - [place], [place] - [address] AT [facility] OF [address] (Ph: [phone]  HAS THE PATIENT CONTACTED THEIR PHARMACY  Yes    IS THIS A 90 DAY SUPPLY : Yes  IS PATIENT OUT OF MEDICATION: No  IF NOT; HOW MUCH IS LEFT: Unsure  LAST APPOINTMENT DATE: @05 /16/2023  NEXT APPOINTMENT DATE:@9 /25/2023  DO WE HAVE YOUR PERMISSION TO LEAVE A DETAILED MESSAGE?: Yes  OTHER COMMENTS: Patient says that [facility] says that they never received the prescriptions from the last visit   **Let patient know to contact pharmacy at the end of the day to make sure medication is ready. **  ** Please notify patient to allow 48-72 hours to process**  **Encourage patient to contact the pharmacy for refills or they can request refills through [facility]**

## 2022-02-19 NOTE — Telephone Encounter (Signed)
RX now sent to preferred pharmacy.  

## 2022-02-24 ENCOUNTER — Telehealth: Payer: Self-pay | Admitting: Internal Medicine

## 2022-02-24 DIAGNOSIS — E1165 Type 2 diabetes mellitus with hyperglycemia: Secondary | ICD-10-CM

## 2022-02-24 DIAGNOSIS — R739 Hyperglycemia, unspecified: Secondary | ICD-10-CM

## 2022-02-24 NOTE — Telephone Encounter (Signed)
MEDICATION: Rybelsus 14 mg, Freestyle Libre 2  PHARMACY:  [facility]  HAS THE PATIENT CONTACTED THEIR PHARMACY?  no  IS THIS A 90 DAY SUPPLY : unknown  IS PATIENT OUT OF MEDICATION: yes  IF NOT; HOW MUCH IS LEFT:   LAST APPOINTMENT DATE: @5 /16/23  NEXT APPOINTMENT DATE:@9 /25/2023  DO WE HAVE YOUR PERMISSION TO LEAVE A DETAILED MESSAGE?:  OTHER COMMENTS:    **Let patient know to contact pharmacy at the end of the day to make sure medication is ready. **  ** Please notify patient to allow 48-72 hours to process**  **Encourage patient to contact the pharmacy for refills or they can request refills through [facility]**

## 2022-02-25 MED ORDER — FREESTYLE LIBRE 2 SENSOR MISC: 1.0000 | 3 refills | Status: DC

## 2022-02-25 NOTE — Telephone Encounter (Signed)
Rx for Rybelsus sent 02/19/22. Rx for [place] sent to preferred pharmacy.

## 2022-03-02 ENCOUNTER — Encounter: Payer: Self-pay | Admitting: Internal Medicine

## 2022-03-02 ENCOUNTER — Ambulatory Visit: Payer: Medicare Other | Admitting: Internal Medicine

## 2022-03-02 VITALS — BP 128/70 | HR 81 | Ht 75.0 in | Wt 270.2 lb

## 2022-03-02 DIAGNOSIS — E785 Hyperlipidemia, unspecified: Secondary | ICD-10-CM | POA: Diagnosis not present

## 2022-03-02 DIAGNOSIS — E1159 Type 2 diabetes mellitus with other circulatory complications: Secondary | ICD-10-CM | POA: Diagnosis not present

## 2022-03-02 DIAGNOSIS — E1165 Type 2 diabetes mellitus with hyperglycemia: Secondary | ICD-10-CM

## 2022-03-02 LAB — POCT GLYCOSYLATED HEMOGLOBIN (HGB A1C): Hemoglobin A1C: 6.7 % — AB (ref 4.0–5.6)

## 2022-03-02 MED ORDER — SYNJARDY XR 25-1000 MG PO TB24: 1.0000 | ORAL_TABLET | Freq: Every day | ORAL | 3 refills | Status: DC

## 2022-03-02 NOTE — Patient Instructions (Addendum)
Please continue: - Rybelsus 7 mg before b'fast  Restart: - Synjardy 1000-25 mg in am  Please return in 4 months.

## 2022-03-02 NOTE — Progress Notes (Addendum)
Patient ID: [name], male   DOB: 09/04/1970, 51 y.o.   MRN: [id number]  HPI: [name] is a 51 y.o.-year-old male, returning for f/u for DM2, dx in 2008, non-insulin-dependent, uncontrolled, with complications (CAD, h/o TIA, PN, ED). He prev. Saw Dr. Loanne Drilling.  Last visit with me 4 months ago.  Interim history: No increased urination, blurry vision, nausea, chest pain. For 2 weeks, he was w/o medications as he did not get them from the pharmacy -was only able to obtain them last night.  Also, he did not have enough sensors.  Reviewed HbA1c: In Dr. Zenia Resides office: 12/2021: HbA1c <7% reportedly Lab Results  Component Value Date   HGBA1C 7.2 (A) 10/21/2021   HGBA1C 7.4 (A) 07/22/2021   HGBA1C 7.6 (A) 04/22/2021   HGBA1C 7.4 (A) 02/18/2021   HGBA1C 7.9 (H) 12/05/2020   HGBA1C 7.4 (A) 09/30/2020   HGBA1C 7.5 (A) 01/10/2020   HGBA1C 6.8 (H) 08/29/2019   HGBA1C 6.4 (A) 04/24/2019   HGBA1C 8.2 (A) 02/20/2019   At last visit, he was on: - Synjardy (Empagliflozin-Metformin ER 25-1000 mg) in am - stopped 2/2 coverage gap  - Rybelsus 3 mg daily - started 07/2021 He had weight gain with Actos. He had nausea with Repaglinide. Stopped Nateglinide per cardiology.  He has a h/o gall bladder- related pancreatitis 2019.  We changed to: - Metformin ER 1000 mg 2x a day - Rybelsus 7 mg daily in am  Pt checks his sugars >4x a day with his freestyle libre 3 CGM - but only had it for 2.5 days in the last 2 weeks:  Previously:   Lowest sugar was 59 (night) >> 63; ? hypoglycemia awareness.  Highest sugar was 246 (cookout) >> 290.  Glucometer: One Touch Verio  - no CKD, last BUN/creatinine:  Lab Results  Component Value Date   BUN 13 10/20/2021   BUN 12 12/05/2020   CREATININE 0.77 10/20/2021   CREATININE 0.89 12/05/2020  On Entresto.  - + HL; last set of lipids: Lab Results  Component Value Date   CHOL 98 12/05/2020   HDL 32.20 (L) 12/05/2020   LDLCALC 42 12/05/2020    TRIG 115.0 12/05/2020   CHOLHDL 3 12/05/2020  On Crestor 40 mg daily.  - last eye exam was in 10/2021. No DR.   - no numbness and tingling in his feet. Last foot exam: 02/2021.  He has a h/o: HTN, gout, pulm. Sarcoidosis, seizures, GERD. Also, recurring pericarditis.  ROS: + see HPI  Past Medical History:  Diagnosis Date   Anginal pain ([place])    Arthritis    ? 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 Followed by Dr. Trudie Reed   Arthritis    "ankles" (04/13/2016)   Asthma    CORONARY ARTERY DISEASE    a. cath 05/2014: mild LAD disease, normal LCx and RCA. Risk factor modification recommended.   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)    MSLT 01/09/09 & MRI brain 01/09/09   Pericarditis    recurrent   POLYNEUROPATHY    PULMONARY SARCOIDOSIS    Mediastinal lymphadenopathy with biospy proven sarcodosis   Seizures ([place])    "none in 4-5 years; don't know what kind; not related to alcohol" (04/13/2016)   TESTICULAR HYPOFUNCTION    TIA (transient ischemic  attack)    "I don't remember when" (04/13/2016)   Past Surgical History:  Procedure Laterality Date   BRONCHOSCOPY  08/21/08   CARDIAC CATHETERIZATION  06/25/2009   minimal disease   CARDIAC CATHETERIZATION N/A 04/14/2016   Procedure: Left Heart Cath and Coronary Angiography;  Surgeon: Burnell Blanks, MD;  Location: [facility];  Service: Cardiovascular;  Laterality: N/A;   CHOLECYSTECTOMY N/A 09/21/2017   Procedure: LAPAROSCOPIC CHOLECYSTECTOMY WITH INTRAOPERATIVE CHOLANGIOGRAM;  Surgeon: Excell Seltzer, MD;  Location: [facility];  Service: General;  Laterality: N/A;   LEFT HEART CATHETERIZATION WITH CORONARY ANGIOGRAM N/A 05/18/2014   Procedure: LEFT HEART CATHETERIZATION WITH CORONARY ANGIOGRAM;  Surgeon: Jettie Booze, MD;  Location: [facility];  Service:  Cardiovascular;  Laterality: N/A;   MEDIASTINOSCOPY  11/30/08   Social History   Socioeconomic History   Marital status: Significant Other    Spouse name: Not on file   Number of children: 3   Years of education: Not on file   Highest education level: Not on file  Occupational History   Occupation: Unemployed  Tobacco Use   Smoking status: Former    Packs/day: 1.00    Years: 10.00    Total pack years: 10.00    Types: Cigarettes    Quit date: 05/18/2009    Years since quitting: 12.7   Smokeless tobacco: Never  Vaping Use   Vaping Use: Never used  Substance and Sexual Activity   Alcohol use: Yes    Comment: rare   Drug use: No   Sexual activity: Yes  Other Topics Concern   Not on file  Social History Narrative   Married, lives with wife and 3 kids.    Currently student. 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 [employer] Strain: [place]  (07/22/2020)   Overall Financial Resource Strain (CARDIA)    Difficulty of Paying Living Expenses: Not hard at all  Food Insecurity: No Food Insecurity (07/22/2020)   Hunger Vital Sign    Worried About Running Out of Food in the Last Year: Never true    [place] in the Last Year: Never true  Transportation Needs: No Transportation Needs (07/22/2020)   PRAPARE - Hydrologist (Medical): No    Lack of Transportation (Non-Medical): No  Physical Activity: Inactive (07/22/2020)   Exercise Vital Sign    Days of Exercise per Week: 0 days    Minutes of Exercise per Session: 0 min  Stress: No Stress Concern Present (07/22/2020)   [place]    Feeling of Stress : Not at all  Social Connections: [place] (07/22/2020)   Social Connection and Isolation Panel [NHANES]    Frequency of Communication with Friends and Family: More than three times a week    Frequency of Social Gatherings with  Friends and Family: More than three times a week    Attends Religious Services: More than 4 times per year    Active Member of [employer] or Organizations: No    Attends Music therapist: More than 4 times per year    Marital Status: Living with partner  Intimate Partner Violence: Not on file   Current Outpatient Medications on File Prior to Visit  Medication Sig Dispense Refill   albuterol (VENTOLIN HFA) 108 (90 Base) MCG/ACT inhaler Inhale 2 puffs into the lungs every 6 (six) hours as  needed for wheezing or shortness of breath. 1 each 3   alfuzosin (UROXATRAL) 10 MG 24 hr tablet Take 10 mg by mouth daily.     allopurinol (ZYLOPRIM) 300 MG tablet TAKE 1 TABLET(300 MG) BY MOUTH DAILY 90 tablet 1   aspirin EC 81 MG tablet Take 1 tablet (81 mg total) by mouth daily. 30 tablet 3   cetirizine (ZYRTEC) 10 MG tablet Take 10 mg by mouth daily.     cholecalciferol (VITAMIN D) 1000 units tablet Take 1,000 Units by mouth daily.     clomiPHENE (CLOMID) 50 MG tablet Take 50 mg by mouth every other day.     colchicine 0.6 MG tablet Take 1 tablet (0.6 mg total) by mouth 2 (two) times daily. 180 tablet 0   Continuous Blood Gluc Sensor (FREESTYLE LIBRE 2 SENSOR) MISC 1 Device by Does not apply route every 14 (fourteen) days. 6 each 3   docusate sodium (COLACE) 100 MG capsule Take 100 mg by mouth daily.     Empagliflozin-metFORMIN HCl ER (SYNJARDY XR) 12.10-998 MG TB24 Take 2 tablets by mouth daily. (Patient not taking: Reported on 10/21/2021) 180 tablet 3   fluticasone (FLONASE) 50 MCG/ACT nasal spray Place 2 sprays into both nostrils daily. 16 g 6   folic acid (FOLVITE) 1 MG tablet Take 1 mg by mouth daily.  1   glucose blood (ONETOUCH VERIO) test strip 1 each by Other route daily. And lancets 1/day (Patient taking differently: 1 each by Other route daily.) 100 each 11   meloxicam (MOBIC) 15 MG tablet Take 15 mg by mouth daily.     metFORMIN (GLUCOPHAGE-XR) 500 MG 24 hr tablet Take 2 tablets (1,000  mg total) by mouth 2 (two) times daily. 360 tablet 3   metoprolol succinate (TOPROL-XL) 50 MG 24 hr tablet Take 50 mg by mouth daily. Take with or immediately following a meal.     nitroGLYCERIN (NITROSTAT) 0.4 MG SL tablet Place 1 tablet (0.4 mg total) under the tongue every 5 (five) minutes as needed for chest pain. 25 tablet 3   pantoprazole (PROTONIX) 40 MG tablet TAKE 2 TABLETS(80 MG) BY MOUTH DAILY 180 tablet 0   rosuvastatin (CRESTOR) 40 MG tablet TAKE 1 TABLET(40 MG) BY MOUTH DAILY 90 tablet 1   sacubitril-valsartan (ENTRESTO) 49-51 MG Take 1 tablet by mouth 2 (two) times daily. Via NOVARTIS pt assistance     saw palmetto 500 MG capsule Take 500 mg by mouth daily.     Semaglutide (RYBELSUS) 7 MG TABS Take 7 mg by mouth daily. 90 tablet 3   tadalafil (CIALIS) 20 MG tablet TAKE ONE TABLET BY MOUTH DAILY AS NEEDED 20 tablet 11   Zinc Sulfate (ZINC 15 PO) Take 1 tablet by mouth daily.     No current facility-administered medications on file prior to visit.   Allergies  Allergen Reactions   Amlodipine Other (See Comments)    Ankle swelling with 10mg , ok with 5mg  dose   Sulfamethoxazole Rash    fever   Sulfonamide Derivatives Rash and Other (See Comments)   Family History  Problem Relation Age of Onset   Diabetes Mother    Hypertension Mother    Colon polyps Mother    Diabetes Father    Heart disease Father 29       Fatal MI   Heart failure Father    Colon polyps Father    Pancreatic cancer Maternal Grandmother    Stomach cancer Maternal Uncle    Pancreatic cancer  Maternal Uncle    Colon cancer Neg Hx    Esophageal cancer Neg Hx    Rectal cancer Neg Hx    Liver disease Neg Hx    PE: BP 128/70 (BP Location: Left Arm, Patient Position: Sitting, Cuff Size: Normal)   Pulse 81   Ht 6\' 3"  (1.905 m)   Wt 270 lb 3.2 oz (122.6 kg)   SpO2 96%   BMI 33.77 kg/m  Wt Readings from Last 3 Encounters:  03/02/22 270 lb 3.2 oz (122.6 kg)  10/21/21 267 lb (121.1 kg)  10/20/21 258 lb  (117 kg)   Constitutional: overweight, in NAD Eyes: EOMI, no exophthalmos ENT: no thyromegaly, no cervical lymphadenopathy Cardiovascular: RRR, No MRG, + LLE mild periankle swelling Respiratory: CTA B Musculoskeletal: no deformities Skin: no rashes Neurological: no tremor with outstretched hands Diabetic Foot Exam - Simple   Simple Foot Form Diabetic Foot exam was performed with the following findings: Yes 03/02/2022  8:12 AM  Visual Inspection No deformities, no ulcerations, no other skin breakdown bilaterally: Yes Sensation Testing Intact to touch and monofilament testing bilaterally: Yes Pulse Check Posterior Tibialis and Dorsalis pulse intact bilaterally: Yes Comments    ASSESSMENT: 1. DM2, non-insulin-dependent, uncontrolled, with complications - CAD - h/o TIA - PN - ED  2. HL  PLAN:  1. Patient with longstanding, uncontrolled, type 2 diabetes, on oral antidiabetic regimen with slightly improved control at last visit, when HbA1c returned 7.2%, decreased from 7.4%.  At that time, reviewing his CGM trends, sugars were fluctuating in the normal range but closer to the upper limit of normal, especially after breakfast.  At that time he was out of Synjardy for almost 2 weeks.  He did see more fluctuating blood sugars since then with the highest being 240 after cookout.  I advised him to increase the Rybelsus since he was using the lowest dose available, and, since Synjardy was not affordable, I advised him to only start metformin ER instead, with a plan to add an SGLT2 inhibitor later. -At today's visit, we do not have enough data for a proper CGM evaluation.  However, for the 2.5 days when he wore it, his sugars were high after meals, with a peak at 290 yesterday.  On questioning, he was without medication for 2 weeks due to delay in getting up from the pharmacy.  As of now, he was just able to restart them.  Since he is out of the coverage gap, we can restart Synjardy.  I explained  that the SGLT2 inhibitor part of this would be beneficial for cardiovascular and renal outcomes.  We will continue Rybelsus at the same dose for now.  He is tolerating it well. - I suggested to:  Patient Instructions  Please continue: - Rybelsus 7 mg before b'fast  Restart: - Synjardy 1000-25 mg in am  Please return in 4 months.  - we checked his HbA1c: 6.7% (lower) - advised to check sugars at different times of the day - 4x a day, rotating check times - advised for yearly eye exams >> he is UTD - return to clinic in 4 months  2.  HL -Reviewed latest lipid panel from 11/2020: LDL at goal, HDL low: Lab Results  Component Value Date   CHOL 98 12/05/2020   HDL 32.20 (L) 12/05/2020   LDLCALC 42 12/05/2020   TRIG 115.0 12/05/2020   CHOLHDL 3 12/05/2020  -He continues on Crestor 40 mg daily without side effects -He had a lipid panel obtained  by Dr. Terrence Dupont 2 months ago.  We will try to obtain the records.  Philemon Kingdom, MD PhD [facility]

## 2022-03-16 ENCOUNTER — Telehealth: Payer: Self-pay | Admitting: Licensed Clinical Social Worker

## 2022-03-16 NOTE — Patient Outreach (Signed)
  Care Coordination   03/16/2022 Name: [name] MRN: [id number] DOB: 17-Feb-1971   Care Coordination Outreach Attempts:  An unsuccessful telephone outreach was attempted today to offer the patient information about available care coordination services as a benefit of their health plan.   Follow Up Plan:  Additional outreach attempts will be made to offer the patient care coordination information and services.   Encounter Outcome:  No Answer  Care Coordination Interventions Activated:  No   Care Coordination Interventions:  No, not indicated    Casimer Lanius, [place] [phone]

## 2022-03-23 DIAGNOSIS — E119 Type 2 diabetes mellitus without complications: Secondary | ICD-10-CM | POA: Diagnosis not present

## 2022-03-23 DIAGNOSIS — E785 Hyperlipidemia, unspecified: Secondary | ICD-10-CM | POA: Diagnosis not present

## 2022-03-23 DIAGNOSIS — I10 Essential (primary) hypertension: Secondary | ICD-10-CM | POA: Diagnosis not present

## 2022-03-23 DIAGNOSIS — F17290 Nicotine dependence, other tobacco product, uncomplicated: Secondary | ICD-10-CM | POA: Diagnosis not present

## 2022-03-26 ENCOUNTER — Other Ambulatory Visit: Payer: Self-pay | Admitting: Urology

## 2022-03-31 ENCOUNTER — Ambulatory Visit: Payer: Medicare Other | Admitting: Internal Medicine

## 2022-04-16 ENCOUNTER — Ambulatory Visit (INDEPENDENT_AMBULATORY_CARE_PROVIDER_SITE_OTHER): Payer: Medicare Other | Admitting: Internal Medicine

## 2022-04-16 ENCOUNTER — Encounter: Payer: Self-pay | Admitting: Internal Medicine

## 2022-04-16 VITALS — BP 132/76 | HR 86 | Temp 98.5°F | Resp 16 | Ht 75.0 in | Wt 265.0 lb

## 2022-04-16 DIAGNOSIS — E1169 Type 2 diabetes mellitus with other specified complication: Secondary | ICD-10-CM

## 2022-04-16 DIAGNOSIS — E669 Obesity, unspecified: Secondary | ICD-10-CM

## 2022-04-16 DIAGNOSIS — I10 Essential (primary) hypertension: Secondary | ICD-10-CM | POA: Diagnosis not present

## 2022-04-16 DIAGNOSIS — M1000 Idiopathic gout, unspecified site: Secondary | ICD-10-CM | POA: Diagnosis not present

## 2022-04-16 DIAGNOSIS — D869 Sarcoidosis, unspecified: Secondary | ICD-10-CM

## 2022-04-16 DIAGNOSIS — E785 Hyperlipidemia, unspecified: Secondary | ICD-10-CM

## 2022-04-16 DIAGNOSIS — N410 Acute prostatitis: Secondary | ICD-10-CM

## 2022-04-16 DIAGNOSIS — Z0000 Encounter for general adult medical examination without abnormal findings: Secondary | ICD-10-CM | POA: Diagnosis not present

## 2022-04-16 LAB — BASIC METABOLIC PANEL
BUN: 12 mg/dL (ref 6–23)
CO2: 28 mEq/L (ref 19–32)
Calcium: 9.1 mg/dL (ref 8.4–10.5)
Chloride: 107 mEq/L (ref 96–112)
Creatinine, Ser: 0.82 mg/dL (ref 0.40–1.50)
GFR: 103.26 mL/min (ref >60.00)
Glucose, Bld: 132 mg/dL — ABNORMAL HIGH (ref 70–99)
Potassium: 3.7 mEq/L (ref 3.5–5.1)
Sodium: 141 mEq/L (ref 135–145)

## 2022-04-16 LAB — HEPATIC FUNCTION PANEL
ALT: 23 U/L (ref 0–53)
AST: 17 U/L (ref 0–37)
Albumin: 4.3 g/dL (ref 3.5–5.2)
Alkaline Phosphatase: 73 U/L (ref 39–117)
Bilirubin, Direct: 0.1 mg/dL (ref 0.0–0.3)
Total Bilirubin: 0.2 mg/dL (ref 0.2–1.2)
Total Protein: 7 g/dL (ref 6.0–8.3)

## 2022-04-16 LAB — LIPID PANEL
Cholesterol: 112 mg/dL (ref 0–200)
HDL: 41.3 mg/dL (ref >39.00)
LDL Cholesterol: 60 mg/dL (ref 0–99)
NonHDL: 70.92
Total CHOL/HDL Ratio: 3
Triglycerides: 53 mg/dL (ref 0.0–149.0)
VLDL: 10.6 mg/dL (ref 0.0–40.0)

## 2022-04-16 LAB — URINALYSIS, ROUTINE W REFLEX MICROSCOPIC
Bilirubin Urine: NEGATIVE
Hgb urine dipstick: NEGATIVE
Leukocytes,Ua: NEGATIVE
Nitrite: NEGATIVE
RBC / HPF: NONE SEEN (ref 0–?)
Specific Gravity, Urine: 1.02 (ref 1.000–1.030)
Total Protein, Urine: NEGATIVE
Urine Glucose: >=1000 — AB
Urobilinogen, UA: 0.2 (ref 0.0–1.0)
WBC, UA: NONE SEEN (ref 0–?)
pH: 5.5 (ref 5.0–8.0)

## 2022-04-16 LAB — MICROALBUMIN / CREATININE URINE RATIO
Creatinine,U: 73.3 mg/dL
Microalb Creat Ratio: 5.7 mg/g (ref 0.0–30.0)
Microalb, Ur: 4.2 mg/dL — ABNORMAL HIGH (ref 0.0–1.9)

## 2022-04-16 LAB — URIC ACID: Uric Acid, Serum: 2.9 mg/dL — ABNORMAL LOW (ref 4.0–7.8)

## 2022-04-16 LAB — TSH: TSH: 1.22 u[IU]/mL (ref 0.35–5.50)

## 2022-04-16 LAB — PSA: PSA: 1.11 ng/mL (ref 0.10–4.00)

## 2022-04-16 MED ORDER — LEVOFLOXACIN 500 MG PO TABS: 500.0000 mg | ORAL_TABLET | Freq: Every day | ORAL | 0 refills | Status: AC

## 2022-04-16 NOTE — Patient Instructions (Signed)
Health Maintenance, Male Adopting a healthy lifestyle and getting preventive care are important in promoting health and wellness. Ask your health care provider about: The right schedule for you to have regular tests and exams. Things you can do on your own to prevent diseases and keep yourself healthy. What should I know about diet, weight, and exercise? Eat a healthy diet  Eat a diet that includes plenty of vegetables, fruits, low-fat dairy products, and lean protein. Do not eat a lot of foods that are high in solid fats, added sugars, or sodium. Maintain a healthy weight Body mass index (BMI) is a measurement that can be used to identify possible weight problems. It estimates body fat based on height and weight. Your health care provider can help determine your BMI and help you achieve or maintain a healthy weight. Get regular exercise Get regular exercise. This is one of the most important things you can do for your health. Most adults should: Exercise for at least 150 minutes each week. The exercise should increase your heart rate and make you sweat (moderate-intensity exercise). Do strengthening exercises at least twice a week. This is in addition to the moderate-intensity exercise. Spend less time sitting. Even light physical activity can be beneficial. Watch cholesterol and blood lipids Have your blood tested for lipids and cholesterol at 49 years of age, then have this test every 5 years. You may need to have your cholesterol levels checked more often if: Your lipid or cholesterol levels are high. You are older than 49 years of age. You are at high risk for heart disease. What should I know about cancer screening? Many types of cancers can be detected early and may often be prevented. Depending on your health history and family history, you may need to have cancer screening at various ages. This may include screening for: Colorectal cancer. Prostate cancer. Skin cancer. Lung  cancer. What should I know about heart disease, diabetes, and high blood pressure? Blood pressure and heart disease High blood pressure causes heart disease and increases the risk of stroke. This is more likely to develop in people who have high blood pressure readings or are overweight. Talk with your health care provider about your target blood pressure readings. Have your blood pressure checked: Every 3-5 years if you are 18-39 years of age. Every year if you are 40 years old or older. If you are between the ages of 65 and 75 and are a current or former smoker, ask your health care provider if you should have a one-time screening for abdominal aortic aneurysm (AAA). Diabetes Have regular diabetes screenings. This checks your fasting blood sugar level. Have the screening done: Once every three years after age 45 if you are at a normal weight and have a low risk for diabetes. More often and at a younger age if you are overweight or have a high risk for diabetes. What should I know about preventing infection? Hepatitis B If you have a higher risk for hepatitis B, you should be screened for this virus. Talk with your health care provider to find out if you are at risk for hepatitis B infection. Hepatitis C Blood testing is recommended for: Everyone born from 1945 through 1965. Anyone with known risk factors for hepatitis C. Sexually transmitted infections (STIs) You should be screened each year for STIs, including gonorrhea and chlamydia, if: You are sexually active and are younger than 49 years of age. You are older than 49 years of age and your   health care provider tells you that you are at risk for this type of infection. Your sexual activity has changed since you were last screened, and you are at increased risk for chlamydia or gonorrhea. Ask your health care provider if you are at risk. Ask your health care provider about whether you are at high risk for HIV. Your health care provider  may recommend a prescription medicine to help prevent HIV infection. If you choose to take medicine to prevent HIV, you should first get tested for HIV. You should then be tested every 3 months for as long as you are taking the medicine. Follow these instructions at home: Alcohol use Do not drink alcohol if your health care provider tells you not to drink. If you drink alcohol: Limit how much you have to 0-2 drinks a day. Know how much alcohol is in your drink. In the [place] one drink equals one 12 oz bottle of beer (355 mL), one 5 oz glass of wine (148 mL), or one 1 oz glass of hard liquor (44 mL). Lifestyle Do not use any products that contain nicotine or tobacco. These products include cigarettes, chewing tobacco, and vaping devices, such as e-cigarettes. If you need help quitting, ask your health care provider. Do not use street drugs. Do not share needles. Ask your health care provider for help if you need support or information about quitting drugs. General instructions Schedule regular health, dental, and eye exams. Stay current with your vaccines. Tell your health care provider if: You often feel depressed. You have ever been abused or do not feel safe at home. Summary Adopting a healthy lifestyle and getting preventive care are important in promoting health and wellness. Follow your health care provider's instructions about healthy diet, exercising, and getting tested or screened for diseases. Follow your health care provider's instructions on monitoring your cholesterol and blood pressure. This information is not intended to replace advice given to you by your health care provider. Make sure you discuss any questions you have with your health care provider. Document Revised: 10/14/2020 Document Reviewed: 10/14/2020 Elsevier Patient Education  2023 [employer]  

## 2022-04-16 NOTE — Progress Notes (Signed)
Subjective:  Patient ID: [name], male    DOB: [birth year]  Age: 51 y.o. MRN: [id number]  CC: Annual Exam, Hypertension, Diabetes, and Hyperlipidemia   HPI [name] presents for a CPX and f/up -   He complains of a 1 week history of frequency and dysuria.  He has a history of prostatitis.  He denies pelvic pain, hematuria, penile discharge, or difficulty urinating.  Outpatient Medications Prior to Visit  Medication Sig Dispense Refill   albuterol (VENTOLIN HFA) 108 (90 Base) MCG/ACT inhaler Inhale 2 puffs into the lungs every 6 (six) hours as needed for wheezing or shortness of breath. 1 each 3   alfuzosin (UROXATRAL) 10 MG 24 hr tablet Take 10 mg by mouth daily.     allopurinol (ZYLOPRIM) 300 MG tablet TAKE 1 TABLET(300 MG) BY MOUTH DAILY 90 tablet 1   aspirin EC 81 MG tablet Take 1 tablet (81 mg total) by mouth daily. 30 tablet 3   cetirizine (ZYRTEC) 10 MG tablet Take 10 mg by mouth daily.     cholecalciferol (VITAMIN D) 1000 units tablet Take 1,000 Units by mouth daily.     clomiPHENE (CLOMID) 50 MG tablet Take 50 mg by mouth every other day.     colchicine 0.6 MG tablet Take 1 tablet (0.6 mg total) by mouth 2 (two) times daily. 180 tablet 0   Continuous Blood Gluc Sensor (FREESTYLE LIBRE 2 SENSOR) MISC 1 Device by Does not apply route every 14 (fourteen) days. 6 each 3   docusate sodium (COLACE) 100 MG capsule Take 100 mg by mouth daily.     Empagliflozin-metFORMIN HCl ER (SYNJARDY XR) 25-1000 MG TB24 Take 1 tablet by mouth daily. 90 tablet 3   fluticasone (FLONASE) 50 MCG/ACT nasal spray Place 2 sprays into both nostrils daily. 16 g 6   folic acid (FOLVITE) 1 MG tablet Take 1 mg by mouth daily.  1   glucose blood (ONETOUCH VERIO) test strip 1 each by Other route daily. And lancets 1/day (Patient taking differently: 1 each by Other route daily.) 100 each 11   metoprolol succinate (TOPROL-XL) 50 MG 24 hr tablet Take 50 mg by mouth daily. Take with or immediately  following a meal.     nitroGLYCERIN (NITROSTAT) 0.4 MG SL tablet Place 1 tablet (0.4 mg total) under the tongue every 5 (five) minutes as needed for chest pain. 25 tablet 3   pantoprazole (PROTONIX) 40 MG tablet TAKE 2 TABLETS(80 MG) BY MOUTH DAILY 180 tablet 0   rosuvastatin (CRESTOR) 40 MG tablet TAKE 1 TABLET(40 MG) BY MOUTH DAILY 90 tablet 1   sacubitril-valsartan (ENTRESTO) 49-51 MG Take 1 tablet by mouth 2 (two) times daily. Via NOVARTIS pt assistance     saw palmetto 500 MG capsule Take 500 mg by mouth daily.     Semaglutide (RYBELSUS) 7 MG TABS Take 7 mg by mouth daily. 90 tablet 3   tadalafil (CIALIS) 20 MG tablet TAKE ONE TABLET BY MOUTH DAILY AS NEEDED 20 tablet 11   Zinc Sulfate (ZINC 15 PO) Take 1 tablet by mouth daily.     meloxicam (MOBIC) 15 MG tablet Take 15 mg by mouth daily.     metFORMIN (GLUCOPHAGE-XR) 500 MG 24 hr tablet Take 2 tablets (1,000 mg total) by mouth 2 (two) times daily. 360 tablet 3   No facility-administered medications prior to visit.    ROS Review of Systems  Constitutional: Negative.  Negative for chills, diaphoresis, fatigue and fever.  HENT:  Negative.    Eyes: Negative.   Respiratory: Negative.  Negative for cough, shortness of breath and wheezing.   Cardiovascular:  Negative for chest pain, palpitations and leg swelling.  Gastrointestinal:  Negative for abdominal pain, constipation, diarrhea, nausea and vomiting.  Endocrine: Negative.   Genitourinary:  Positive for dysuria and frequency. Negative for difficulty urinating, flank pain, hematuria, penile discharge, penile swelling, scrotal swelling, testicular pain and urgency.  Musculoskeletal: Negative.  Negative for arthralgias.  Skin: Negative.   Neurological:  Negative for dizziness and weakness.  Hematological:  Negative for adenopathy. Does not bruise/bleed easily.  Psychiatric/Behavioral: Negative.      Objective:  BP 132/76 (BP Location: Left Arm, Patient Position: Sitting, Cuff Size:  Large)   Pulse 86   Temp 98.5 F (36.9 C) (Oral)   Resp 16   Ht 6\' 3"  (1.905 m)   Wt 265 lb (120.2 kg)   SpO2 94%   BMI 33.12 kg/m   BP Readings from Last 3 Encounters:  04/16/22 132/76  03/02/22 128/70  10/21/21 140/80    Wt Readings from Last 3 Encounters:  04/16/22 265 lb (120.2 kg)  03/02/22 270 lb 3.2 oz (122.6 kg)  10/21/21 267 lb (121.1 kg)    Physical Exam Vitals reviewed.  HENT:     Nose: Nose normal.     Mouth/Throat:     Mouth: Mucous membranes are moist.  Eyes:     General: No scleral icterus.    Conjunctiva/sclera: Conjunctivae normal.  Cardiovascular:     Rate and Rhythm: Normal rate and regular rhythm.     Heart sounds: No murmur heard. Pulmonary:     Effort: Pulmonary effort is normal.     Breath sounds: No stridor. No wheezing, rhonchi or rales.  Abdominal:     General: Abdomen is flat.     Palpations: There is no mass.     Tenderness: There is no abdominal tenderness. There is no guarding.     Hernia: No hernia is present. There is no hernia in the left inguinal area or right inguinal area.  Genitourinary:    Pubic Area: No rash.      Penis: Normal and circumcised.      Testes: Normal.     Epididymis:     Right: Normal.     Left: Normal.     Prostate: Enlarged and tender. No nodules present.     Rectum: Normal. Guaiac result negative. No mass, tenderness, anal fissure, external hemorrhoid or internal hemorrhoid. Normal anal tone.  Musculoskeletal:        General: Normal range of motion.     Cervical back: Neck supple.     Right lower leg: No edema.     Left lower leg: No edema.  Lymphadenopathy:     Cervical: No cervical adenopathy.     Lower Body: No right inguinal adenopathy. No left inguinal adenopathy.  Skin:    General: Skin is warm and dry.  Neurological:     General: No focal deficit present.     Mental Status: He is alert. Mental status is at baseline.  Psychiatric:        Mood and Affect: Mood normal.        Behavior:  Behavior normal.     Lab Results  Component Value Date   WBC 3.7 (L) 10/20/2021   HGB 14.4 10/20/2021   HCT 41.5 10/20/2021   PLT 212 10/20/2021   GLUCOSE 132 (H) 04/16/2022   CHOL 112 04/16/2022  TRIG 53.0 04/16/2022   HDL 41.30 04/16/2022   LDLCALC 60 04/16/2022   ALT 23 04/16/2022   AST 17 04/16/2022   NA 141 04/16/2022   K 3.7 04/16/2022   CL 107 04/16/2022   CREATININE 0.82 04/16/2022   BUN 12 04/16/2022   CO2 28 04/16/2022   TSH 1.22 04/16/2022   PSA 1.11 04/16/2022   INR 0.89 05/24/2016   HGBA1C 6.7 (A) 03/02/2022   MICROALBUR 4.2 (H) 04/16/2022    US Venous Img Lower Unilateral Left  Result Date: 10/20/2021 CLINICAL DATA:  Left calf pain and edema for the past 3 days. Evaluate for DVT. EXAM: LEFT LOWER EXTREMITY VENOUS DOPPLER ULTRASOUND TECHNIQUE: Gray-scale sonography with graded compression, as well as color Doppler and duplex ultrasound were performed to evaluate the lower extremity deep venous systems from the level of the common femoral vein and including the common femoral, femoral, profunda femoral, popliteal and calf veins including the posterior tibial, peroneal and gastrocnemius veins when visible. The superficial great saphenous vein was also interrogated. Spectral Doppler was utilized to evaluate flow at rest and with distal augmentation maneuvers in the common femoral, femoral and popliteal veins. COMPARISON:  None Available. FINDINGS: Contralateral Common Femoral Vein: Respiratory phasicity is normal and symmetric with the symptomatic side. No evidence of thrombus. Normal compressibility. Common Femoral Vein: No evidence of thrombus. Normal compressibility, respiratory phasicity and response to augmentation. Saphenofemoral Junction: No evidence of thrombus. Normal compressibility and flow on color Doppler imaging. Profunda Femoral Vein: No evidence of thrombus. Normal compressibility and flow on color Doppler imaging. Femoral Vein: No evidence of thrombus.  Normal compressibility, respiratory phasicity and response to augmentation. Popliteal Vein: No evidence of thrombus. Normal compressibility, respiratory phasicity and response to augmentation. Calf Veins: No evidence of thrombus. Normal compressibility and flow on color Doppler imaging. Superficial Great Saphenous Vein: No evidence of thrombus. Normal compressibility. Venous Reflux:  None. Other Findings:  None. IMPRESSION: No evidence of DVT within the left lower extremity. Electronically Signed   By: Simonne Come M.D.   On: 10/20/2021 07:59    Assessment & Plan:   Leory was seen today for annual exam, hypertension, diabetes and hyperlipidemia.  Diagnoses and all orders for this visit:  Hyperlipidemia with target LDL less than 70- LDL goal achieved. Doing well on the statin  -     Lipid panel; Future -     TSH; Future -     Hepatic function panel; Future -     Hepatic function panel -     TSH -     Lipid panel  Sarcoidosis (HCC)- NED -     Hepatic function panel; Future -     Basic metabolic panel; Future -     Basic metabolic panel -     Hepatic function panel  Routine general medical examination at a health care facility- Exam completed, labs reviewed, vaccines are up-to-date, cancer screenings are up-to-date, patient education was given. -     Cancel: PSA; Future  Idiopathic gout, unspecified chronicity, unspecified site-he has achieved his uric acid goal. -     Basic metabolic panel; Future -     Uric acid; Future -     Uric acid -     Basic metabolic panel  Essential hypertension- His blood pressure is well controlled. -     Urinalysis, Routine w reflex microscopic; Future -     TSH; Future -     Basic metabolic panel; Future -  Basic metabolic panel -     TSH -     Urinalysis, Routine w reflex microscopic  Diabetes mellitus type 2 in obese (HCC)- His blood sugar is well controlled. -     Microalbumin / creatinine urine ratio; Future -     Basic metabolic panel;  Future -     Basic metabolic panel -     Microalbumin / creatinine urine ratio  Acute prostatitis- Will treat with a fluoroquinolone. -     CULTURE, URINE COMPREHENSIVE; Future -     PSA; Future -     PSA -     CULTURE, URINE COMPREHENSIVE -     levofloxacin (LEVAQUIN) 500 MG tablet; Take 1 tablet (500 mg total) by mouth daily for 14 days.   I have discontinued Agustine T. Latchford's meloxicam and metFORMIN. I am also having him start on levofloxacin. Additionally, I am having him maintain his aspirin EC, cholecalciferol, folic acid, glucose blood, clomiPHENE, saw palmetto, fluticasone, metoprolol succinate, sacubitril-valsartan, Zinc Sulfate (ZINC 15 PO), docusate sodium, albuterol, cetirizine, nitroGLYCERIN, colchicine, rosuvastatin, pantoprazole, alfuzosin, allopurinol, Rybelsus, FreeStyle Libre 2 Sensor, Synjardy XR, and tadalafil.  Meds ordered this encounter  Medications   levofloxacin (LEVAQUIN) 500 MG tablet    Sig: Take 1 tablet (500 mg total) by mouth daily for 14 days.    Dispense:  14 tablet    Refill:  0     Follow-up: Return in about 3 months (around 07/17/2022).  Sanda Linger, MD

## 2022-04-20 ENCOUNTER — Telehealth: Payer: Medicare Other

## 2022-04-20 LAB — CULTURE, URINE COMPREHENSIVE: RESULT:: NO GROWTH

## 2022-05-08 DIAGNOSIS — E291 Testicular hypofunction: Secondary | ICD-10-CM | POA: Diagnosis not present

## 2022-05-12 DIAGNOSIS — S83242D Other tear of medial meniscus, current injury, left knee, subsequent encounter: Secondary | ICD-10-CM | POA: Diagnosis not present

## 2022-05-15 DIAGNOSIS — N400 Enlarged prostate without lower urinary tract symptoms: Secondary | ICD-10-CM | POA: Diagnosis not present

## 2022-05-15 DIAGNOSIS — E291 Testicular hypofunction: Secondary | ICD-10-CM | POA: Diagnosis not present

## 2022-06-02 ENCOUNTER — Other Ambulatory Visit: Payer: Self-pay | Admitting: Internal Medicine

## 2022-06-02 DIAGNOSIS — E785 Hyperlipidemia, unspecified: Secondary | ICD-10-CM

## 2022-06-02 DIAGNOSIS — I2510 Atherosclerotic heart disease of native coronary artery without angina pectoris: Secondary | ICD-10-CM

## 2022-06-23 ENCOUNTER — Ambulatory Visit (INDEPENDENT_AMBULATORY_CARE_PROVIDER_SITE_OTHER): Payer: Medicare Other

## 2022-06-23 VITALS — Ht 75.0 in | Wt 247.0 lb

## 2022-06-23 DIAGNOSIS — Z0000 Encounter for general adult medical examination without abnormal findings: Secondary | ICD-10-CM | POA: Diagnosis not present

## 2022-06-23 NOTE — Progress Notes (Signed)
Virtual Visit via Telephone Note  I connected with  [name] on 06/23/22 at 10:15 AM EST by telephone and verified that I am speaking with the correct person using two identifiers.  Location: Patient: Home Provider: [facility] Persons participating in the virtual visit: patient/Nurse Health Advisor   I discussed the limitations, risks, security and privacy concerns of performing an evaluation and management service by telephone and the availability of in person appointments. The patient expressed understanding and agreed to proceed.  Interactive audio and video telecommunications were attempted between this nurse and patient, however failed, due to patient having technical difficulties OR patient did not have access to video capability.  We continued and completed visit with audio only.  Some vital signs may be absent or patient reported.   [name], LPN  Subjective:   [name] is a 52 y.o. male who presents for Medicare Annual/Subsequent preventive examination.  Review of Systems     Cardiac Risk Factors include: advanced age (>83men, >44 women);dyslipidemia;diabetes mellitus;family history of premature cardiovascular disease;hypertension;male gender;obesity (BMI >30kg/m2)     Objective:    Today's Vitals   06/23/22 1016  Weight: 247 lb (112 kg)  Height: 6\' 3"  (1.905 m)  PainSc: 0-No pain   Body mass index is 30.87 kg/m.     06/23/2022   10:18 AM 10/20/2021    4:37 AM 07/22/2020    8:44 AM 02/11/2020    7:54 PM 01/09/2020    7:50 AM 09/20/2017    4:04 PM 09/19/2017    7:43 AM  Advanced Directives  Does Patient Have a Medical Advance Directive? No No No No No No No  Would patient like information on creating a medical advance directive? No - Patient declined No - Patient declined No - Patient declined No - Patient declined  No - Patient declined     Current Medications (verified) Outpatient Encounter Medications as of 06/23/2022   Medication Sig   albuterol (VENTOLIN HFA) 108 (90 Base) MCG/ACT inhaler Inhale 2 puffs into the lungs every 6 (six) hours as needed for wheezing or shortness of breath.   alfuzosin (UROXATRAL) 10 MG 24 hr tablet Take 10 mg by mouth daily.   allopurinol (ZYLOPRIM) 300 MG tablet TAKE 1 TABLET(300 MG) BY MOUTH DAILY   aspirin EC 81 MG tablet Take 1 tablet (81 mg total) by mouth daily.   cetirizine (ZYRTEC) 10 MG tablet Take 10 mg by mouth daily.   cholecalciferol (VITAMIN D) 1000 units tablet Take 1,000 Units by mouth daily.   clomiPHENE (CLOMID) 50 MG tablet Take 50 mg by mouth every other day.   colchicine 0.6 MG tablet Take 1 tablet (0.6 mg total) by mouth 2 (two) times daily.   Continuous Blood Gluc Sensor (FREESTYLE LIBRE 2 SENSOR) MISC 1 Device by Does not apply route every 14 (fourteen) days.   docusate sodium (COLACE) 100 MG capsule Take 100 mg by mouth daily.   Empagliflozin-metFORMIN HCl ER (SYNJARDY XR) 25-1000 MG TB24 Take 1 tablet by mouth daily.   fluticasone (FLONASE) 50 MCG/ACT nasal spray Place 2 sprays into both nostrils daily.   folic acid (FOLVITE) 1 MG tablet Take 1 mg by mouth daily.   glucose blood (ONETOUCH VERIO) test strip 1 each by Other route daily. And lancets 1/day (Patient taking differently: 1 each by Other route daily.)   metoprolol succinate (TOPROL-XL) 50 MG 24 hr tablet Take 50 mg by mouth daily. Take with or immediately following a meal.  nitroGLYCERIN (NITROSTAT) 0.4 MG SL tablet Place 1 tablet (0.4 mg total) under the tongue every 5 (five) minutes as needed for chest pain.   pantoprazole (PROTONIX) 40 MG tablet TAKE 2 TABLETS(80 MG) BY MOUTH DAILY   rosuvastatin (CRESTOR) 40 MG tablet TAKE 1 TABLET(40 MG) BY MOUTH DAILY   sacubitril-valsartan (ENTRESTO) 49-51 MG Take 1 tablet by mouth 2 (two) times daily. Via NOVARTIS pt assistance   saw palmetto 500 MG capsule Take 500 mg by mouth daily.   Semaglutide (RYBELSUS) 7 MG TABS Take 7 mg by mouth daily.    tadalafil (CIALIS) 20 MG tablet TAKE ONE TABLET BY MOUTH DAILY AS NEEDED   Zinc Sulfate (ZINC 15 PO) Take 1 tablet by mouth daily.   No facility-administered encounter medications on file as of 06/23/2022.    Allergies (verified) Amlodipine, Sulfamethoxazole, and Sulfonamide derivatives   History: Past Medical History:  Diagnosis Date   Anginal pain (HCC)    Arthritis    ? 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 Followed by Dr. Nickola Major   Arthritis    "ankles" (04/13/2016)   Asthma    CORONARY ARTERY DISEASE    a. cath 05/2014: mild LAD disease, normal LCx and RCA. Risk factor modification recommended.   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)    MSLT 01/09/09 & MRI brain 01/09/09   Pericarditis    recurrent   POLYNEUROPATHY    PULMONARY SARCOIDOSIS    Mediastinal lymphadenopathy with biospy proven sarcodosis   Seizures (HCC)    "none in 4-5 years; don't know what kind; not related to alcohol" (04/13/2016)   TESTICULAR HYPOFUNCTION    TIA (transient ischemic attack)    "I don't remember when" (04/13/2016)   Past Surgical History:  Procedure Laterality Date   BRONCHOSCOPY  08/21/08   CARDIAC CATHETERIZATION  06/25/2009   minimal disease   CARDIAC CATHETERIZATION N/A 04/14/2016   Procedure: Left Heart Cath and Coronary Angiography;  Surgeon: Kathleene Hazel, MD;  Location: [facility] INVASIVE CV LAB;  Service: Cardiovascular;  Laterality: N/A;   CHOLECYSTECTOMY N/A 09/21/2017   Procedure: LAPAROSCOPIC CHOLECYSTECTOMY WITH INTRAOPERATIVE CHOLANGIOGRAM;  Surgeon: Glenna Fellows, MD;  Location: [facility] ORS;  Service: General;  Laterality: N/A;   LEFT HEART CATHETERIZATION WITH CORONARY ANGIOGRAM N/A 05/18/2014   Procedure: LEFT HEART CATHETERIZATION WITH CORONARY ANGIOGRAM;  Surgeon: Corky Crafts, MD;  Location: [facility] CATH LAB;   Service: Cardiovascular;  Laterality: N/A;   MEDIASTINOSCOPY  11/30/08   Family History  Problem Relation Age of Onset   Diabetes Mother    Hypertension Mother    Colon polyps Mother    Diabetes Father    Heart disease Father 78       Fatal MI   Heart failure Father    Colon polyps Father    Pancreatic cancer Maternal Grandmother    Stomach cancer Maternal Uncle    Pancreatic cancer Maternal Uncle    Colon cancer Neg Hx    Esophageal cancer Neg Hx    Rectal cancer Neg Hx    Liver disease Neg Hx    Social History   Socioeconomic History   Marital status: Significant Other    Spouse name: Not on file   Number of children: 3   Years of education: Not on file   Highest  education level: Not on file  Occupational History   Occupation: Unemployed  Tobacco Use   Smoking status: Former    Packs/day: 1.00    Years: 10.00    Total pack years: 10.00    Types: Cigarettes    Quit date: 05/18/2009    Years since quitting: 13.1   Smokeless tobacco: Never  Vaping Use   Vaping Use: Never used  Substance and Sexual Activity   Alcohol use: Yes    Comment: rare   Drug use: No   Sexual activity: Yes  Other Topics Concern   Not on file  Social History Narrative   Married, lives with wife and 3 kids.    Currently student. 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 [employer] Strain: [place]  (06/23/2022)   Overall Financial Resource Strain (CARDIA)    Difficulty of Paying Living Expenses: Not hard at all  Food Insecurity: No Food Insecurity (06/23/2022)   Hunger Vital Sign    Worried About Running Out of Food in the Last Year: Never true    [place] in the Last Year: Never true  Transportation Needs: No Transportation Needs (06/23/2022)   PRAPARE - Hydrologist (Medical): No    Lack of Transportation (Non-Medical): No  Physical Activity: Sufficiently Active (06/23/2022)   Exercise Vital Sign     Days of Exercise per Week: 7 days    Minutes of Exercise per Session: 30 min  Stress: Stress Concern Present (06/23/2022)   [place]    Feeling of Stress : To some extent  Social Connections: Socially Integrated (06/23/2022)   Social Connection and Isolation Panel [NHANES]    Frequency of Communication with Friends and Family: More than three times a week    Frequency of Social Gatherings with Friends and Family: More than three times a week    Attends Religious Services: More than 4 times per year    Active Member of [employer] or Organizations: No    Attends Music therapist: More than 4 times per year    Marital Status: Living with partner    Tobacco Counseling Counseling given: Not Answered   Clinical Intake:  Pre-visit preparation completed: Yes  Pain : No/denies pain Pain Score: 0-No pain     BMI - recorded: 30.87 Nutritional Status: BMI > 30  Obese Nutritional Risks: None Diabetes: Yes CBG done?: No Did pt. 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  No  Does the patient have any non-healing wounds?  No  Has the patient had any unintentional weight loss or weight gain?  No   Diabetes:  Is the patient diabetic?  Yes  If diabetic, was a CBG obtained today?  No  Did the patient bring in their glucometer from home?  No  How often do you monitor your CBG's? Continuous Glucose Monitor.   Financial Strains and Diabetes Management:  Are you having any financial strains with the device, your supplies or your medication? No .  Does the patient want to be seen by Chronic  Care Management for  management of their diabetes?  No  Would the patient like to be referred to a Nutritionist or for Diabetic Management?  No   Diabetic Exams:  Diabetic Eye Exam: Completed 11/04/2021 Diabetic Foot Exam: Completed 03/02/2022   Interpreter Needed?: No  Information entered by :: Susie Cassette, LPN.   Activities of Daily Living    06/23/2022   10:21 AM  In your present state of health, do you have any difficulty performing the following activities:  Hearing? 0  Vision? 0  Difficulty concentrating or making decisions? 0  Walking or climbing stairs? 0  Dressing or bathing? 0  Doing errands, shopping? 0  Preparing Food and eating ? N  Using the Toilet? N  In the past six months, have you accidently leaked urine? N  Do you have problems with loss of bowel control? N  Managing your Medications? N  Managing your Finances? N  Housekeeping or managing your Housekeeping? N    Patient Care Team: [name], MD as PCP - General (Internal Medicine) Lars Masson, MD as PCP - Cardiology (Cardiology) Loyal Gambler, MD as Referring Physician (Rheumatology) [name], MD as Consulting Physician (Pulmonary Disease) Lars Masson, MD (Cardiology) Mateo Flow, MD as Consulting Physician (Ophthalmology) Carlus Pavlov, MD as Consulting Physician (Endocrinology) Meryl Dare, MD as Consulting Physician (Gastroenterology)  Indicate any recent Medical Services you may have received from other than [facility] providers in the past year (date may be approximate).     Assessment:   This is a routine wellness examination for [place].  Hearing/Vision screen Hearing Screening - Comments:: Denies hearing difficulties   Vision Screening - Comments:: Wears rx glasses - up to date with routine eye exams with [facility]   Dietary issues and exercise activities discussed: Current Exercise Habits: Home exercise routine, Type of exercise: treadmill;walking, Time (Minutes):  30, Frequency (Times/Week): 7, Weekly Exercise (Minutes/Week): 210, Exercise limited by: respiratory conditions(s)   Goals Addressed             This Visit's Progress    To maintain my current health status by continuing to eat healthy, stay physically active and socially active.        Depression Screen    06/23/2022   10:21 AM 04/16/2022   10:07 AM 07/22/2020    8:43 AM 08/29/2019    8:02 AM 06/14/2018    3:56 PM 01/18/2017    2:25 PM  PHQ 2/9 Scores  PHQ - 2 Score 0 0 0 0 0 1  PHQ- 9 Score  3  1  2     Fall Risk    06/23/2022   10:19 AM 07/22/2020    8:45 AM 02/20/2019    4:51 PM 06/14/2018    3:56 PM 01/18/2017    2:25 PM  Fall Risk   Falls in the past year? 0 0 0 0 No  Number falls in past yr: 0 0 0 0   Injury with Fall? 0 0 0 0   Risk for fall due to : No Fall Risks No Fall Risks     Follow up Falls prevention discussed  Falls evaluation completed Falls evaluation completed     FALL RISK PREVENTION PERTAINING TO THE HOME:  Any stairs in or around the home? Yes  If so, are there any without handrails? No  Home free of loose throw rugs in walkways, pet beds, electrical cords, etc? Yes  Adequate lighting in your home to reduce risk  of falls? Yes   ASSISTIVE DEVICES UTILIZED TO PREVENT FALLS:  Life alert? No  Use of a cane, walker or w/c? No  Grab bars in the bathroom? Yes  Shower chair or bench in shower? No  Elevated toilet seat or a handicapped toilet? No   TIMED UP AND GO: Phone Visit  Was the test performed? No .  Cognitive Function:        06/23/2022   10:21 AM  6CIT Screen  What Year? 0 points  What month? 0 points  What time? 0 points  Count back from 20 0 points  Months in reverse 0 points  Repeat phrase 0 points  Total Score 0 points    Immunizations Immunization History  Administered Date(s) Administered   COVID-19, mRNA, vaccine(Comirnaty)12 years and older 02/20/2022   Influenza Split 04/06/2011, 02/17/2012, 03/16/2012   Influenza,  Quadrivalent, Recombinant, Inj, Pf 02/20/2022   Influenza,inj,Quad PF,6+ Mos 03/21/2013, 03/08/2014, 04/03/2015, 02/19/2016, 02/17/2017, 06/14/2018, 02/20/2019, 03/21/2020   Influenza-Unspecified 03/08/2021   Moderna Sars-Covid-2 Vaccination 07/31/2019, 09/05/2019, 04/19/2020, 02/20/2022   Pneumococcal Conjugate-13 04/29/2015   Pneumococcal Polysaccharide-23 11/06/2015   Tdap 03/21/2013    TDAP status: Up to date  Flu Vaccine status: Up to date  Pneumococcal vaccine status: Up to date  Covid-19 vaccine status: Completed vaccines  Qualifies for Shingles Vaccine? No   Zostavax completed No   Shingrix Completed?: No.    Education has been provided regarding the importance of this vaccine. Patient has been advised to call insurance company to determine out of pocket expense if they have not yet received this vaccine. Advised may also receive vaccine at local pharmacy or Health Dept. Verbalized acceptance and understanding.  Screening Tests Health Maintenance  Topic Date Due   COVID-19 Vaccine (5 - 2023-24 season) 04/17/2022   HEMOGLOBIN A1C  08/31/2022   OPHTHALMOLOGY EXAM  11/05/2022   FOOT EXAM  03/03/2023   DTaP/Tdap/Td (2 - Td or Tdap) 03/22/2023   Diabetic kidney evaluation - eGFR measurement  04/17/2023   Diabetic kidney evaluation - Urine ACR  04/17/2023   Medicare Annual Wellness (AWV)  06/24/2023   COLONOSCOPY (Pts 45-41yrs Insurance coverage will need to be confirmed)  10/31/2030   INFLUENZA VACCINE  Completed   Hepatitis C Screening  Completed   HIV Screening  Completed   HPV VACCINES  Aged Out    Health Maintenance  Health Maintenance Due  Topic Date Due   COVID-19 Vaccine (5 - 2023-24 season) 04/17/2022    Colorectal cancer screening: Type of screening: Colonoscopy. Completed 10/30/2020. Repeat every 10 years  Lung Cancer Screening: (Low Dose CT Chest recommended if Age 48-80 years, 30 pack-year currently smoking OR have quit w/in 15years.) does not qualify.    Lung Cancer Screening Referral: no  Additional Screening:  Hepatitis C Screening: does qualify; Completed 12/05/2020  Vision Screening: Recommended annual ophthalmology exams for early detection of glaucoma and other disorders of the eye. Is the patient up to date with their annual eye exam?  Yes  Who is the provider or what is the name of the office in which the patient attends annual eye exams? [facility] If pt is not established with a provider, would they like to be referred to a provider to establish care? No .   Dental Screening: Recommended annual dental exams for proper oral hygiene  Community Resource Referral / Chronic Care Management: CRR required this visit?  No   CCM required this visit?  No      Plan:  I have personally reviewed and noted the following in the patient's chart:   Medical and social history Use of alcohol, tobacco or illicit drugs  Current medications and supplements including opioid prescriptions. Patient is not currently taking opioid prescriptions. Functional ability and status Nutritional status Physical activity Advanced directives List of other physicians Hospitalizations, surgeries, and ER visits in previous 12 months Vitals Screenings to include cognitive, depression, and falls Referrals and appointments  In addition, I have reviewed and discussed with patient certain preventive protocols, quality metrics, and best practice recommendations. A written personalized care plan for preventive services as well as general preventive health recommendations were provided to patient.     [name], LPN   [id number]   Nurse Notes: N/A

## 2022-06-23 NOTE — Patient Instructions (Signed)
[name] , Thank you for taking time to come for your Medicare Wellness Visit. I appreciate your ongoing commitment to your health goals. Please review the following plan we discussed and let me know if I can assist you in the future.   These are the goals we discussed:  Goals      To maintain my current health status by continuing to eat healthy, stay physically active and socially active.        This is a list of the screening recommended for you and due dates:  Health Maintenance  Topic Date Due   COVID-19 Vaccine (5 - 2023-24 season) 04/17/2022   Hemoglobin A1C  08/31/2022   Eye exam for diabetics  11/05/2022   Complete foot exam   03/03/2023   DTaP/Tdap/Td vaccine (2 - Td or Tdap) 03/22/2023   Yearly kidney function blood test for diabetes  04/17/2023   Yearly kidney health urinalysis for diabetes  04/17/2023   Medicare Annual Wellness Visit  06/24/2023   Colon Cancer Screening  10/31/2030   Flu Shot  Completed   Hepatitis C Screening: USPSTF Recommendation to screen - Ages 18-79 yo.  Completed   HIV Screening  Completed   HPV Vaccine  Aged Out    Advanced directives: No; Advance directive discussed with you today. Even though you declined this today please call our office should you change your mind and we can give you the proper paperwork for you to fill out.  Conditions/risks identified: Yes; Type II Diabetes  Next appointment: Follow up in one year for your annual wellness visit.  Preventive Care 40-64 Years, Male Preventive care refers to lifestyle choices and visits with your health care provider that can promote health and wellness. What does preventive care include? A yearly physical exam. This is also called an annual well check. Dental exams once or twice a year. Routine eye exams. Ask your health care provider how often you should have your eyes checked. Personal lifestyle choices, including: Daily care of your teeth and gums. Regular physical  activity. Eating a healthy diet. Avoiding tobacco and drug use. Limiting alcohol use. Practicing safe sex. Taking low-dose aspirin every day starting at age 58. What happens during an annual well check? The services and screenings done by your health care provider during your annual well check will depend on your age, overall health, lifestyle risk factors, and family history of disease. Counseling  Your health care provider may ask you questions about your: Alcohol use. Tobacco use. Drug use. Emotional well-being. Home and relationship well-being. Sexual activity. Eating habits. Work and work Statistician. Screening  You may have the following tests or measurements: Height, weight, and BMI. Blood pressure. Lipid and cholesterol levels. These may be checked every 5 years, or more frequently if you are over 2 years old. Skin check. Lung cancer screening. You may have this screening every year starting at age 57 if you have a 30-pack-year history of smoking and currently smoke or have quit within the past 15 years. Fecal occult blood test (FOBT) of the stool. You may have this test every year starting at age 18. Flexible sigmoidoscopy or colonoscopy. You may have a sigmoidoscopy every 5 years or a colonoscopy every 10 years starting at age 26. Prostate cancer screening. Recommendations will vary depending on your family history and other risks. Hepatitis C blood test. Hepatitis B blood test. Sexually transmitted disease (STD) testing. Diabetes screening. This is done by checking your blood sugar (glucose) after you have  not eaten for a while (fasting). You may have this done every 1-3 years. Discuss your test results, treatment options, and if necessary, the need for more tests with your health care provider. Vaccines  Your health care provider may recommend certain vaccines, such as: Influenza vaccine. This is recommended every year. Tetanus, diphtheria, and acellular pertussis  (Tdap, Td) vaccine. You may need a Td booster every 10 years. Zoster vaccine. You may need this after age 34. Pneumococcal 13-valent conjugate (PCV13) vaccine. You may need this if you have certain conditions and have not been vaccinated. Pneumococcal polysaccharide (PPSV23) vaccine. You may need one or two doses if you smoke cigarettes or if you have certain conditions. Talk to your health care provider about which screenings and vaccines you need and how often you need them. This information is not intended to replace advice given to you by your health care provider. Make sure you discuss any questions you have with your health care provider. Document Released: 06/21/2015 Document Revised: 02/12/2016 Document Reviewed: 03/26/2015 Elsevier Interactive Patient Education  2017 [employer] Prevention in the Home Falls can cause injuries. They can happen to people of all ages. There are many things you can do to make your home safe and to help prevent falls. What can I do on the outside of my home? Regularly fix the edges of walkways and driveways and fix any cracks. Remove anything that might make you trip as you walk through a door, such as a raised step or threshold. Trim any bushes or trees on the path to your home. Use bright outdoor lighting. Clear any walking paths of anything that might make someone trip, such as rocks or tools. Regularly check to see if handrails are loose or broken. Make sure that both sides of any steps have handrails. Any raised decks and porches should have guardrails on the edges. Have any leaves, snow, or ice cleared regularly. Use sand or salt on walking paths during winter. Clean up any spills in your garage right away. This includes oil or grease spills. What can I do in the bathroom? Use night lights. Install grab bars by the toilet and in the tub and shower. Do not use towel bars as grab bars. Use non-skid mats or decals in the tub or shower. If  you need to sit down in the shower, use a plastic, non-slip stool. Keep the floor dry. Clean up any water that spills on the floor as soon as it happens. Remove soap buildup in the tub or shower regularly. Attach bath mats securely with double-sided non-slip rug tape. Do not have throw rugs and other things on the floor that can make you trip. What can I do in the bedroom? Use night lights. Make sure that you have a light by your bed that is easy to reach. Do not use any sheets or blankets that are too big for your bed. They should not hang down onto the floor. Have a firm chair that has side arms. You can use this for support while you get dressed. Do not have throw rugs and other things on the floor that can make you trip. What can I do in the kitchen? Clean up any spills right away. Avoid walking on wet floors. Keep items that you use a lot in easy-to-reach places. If you need to reach something above you, use a strong step stool that has a grab bar. Keep electrical cords out of the way. Do not use floor  polish or wax that makes floors slippery. If you must use wax, use non-skid floor wax. Do not have throw rugs and other things on the floor that can make you trip. What can I do with my stairs? Do not leave any items on the stairs. Make sure that there are handrails on both sides of the stairs and use them. Fix handrails that are broken or loose. Make sure that handrails are as long as the stairways. Check any carpeting to make sure that it is firmly attached to the stairs. Fix any carpet that is loose or worn. Avoid having throw rugs at the top or bottom of the stairs. If you do have throw rugs, attach them to the floor with carpet tape. Make sure that you have a light switch at the top of the stairs and the bottom of the stairs. If you do not have them, ask someone to add them for you. What else can I do to help prevent falls? Wear shoes that: Do not have high heels. Have rubber  bottoms. Are comfortable and fit you well. Are closed at the toe. Do not wear sandals. If you use a stepladder: Make sure that it is fully opened. Do not climb a closed stepladder. Make sure that both sides of the stepladder are locked into place. Ask someone to hold it for you, if possible. Clearly mark and make sure that you can see: Any grab bars or handrails. First and last steps. Where the edge of each step is. Use tools that help you move around (mobility aids) if they are needed. These include: Canes. Walkers. Scooters. Crutches. Turn on the lights when you go into a dark area. Replace any light bulbs as soon as they burn out. Set up your furniture so you have a clear path. Avoid moving your furniture around. If any of your floors are uneven, fix them. If there are any pets around you, be aware of where they are. Review your medicines with your doctor. Some medicines can make you feel dizzy. This can increase your chance of falling. Ask your doctor what other things that you can do to help prevent falls. This information is not intended to replace advice given to you by your health care provider. Make sure you discuss any questions you have with your health care provider. Document Released: 03/21/2009 Document Revised: 10/31/2015 Document Reviewed: 06/29/2014 Elsevier Interactive Patient Education  2017 [employer].

## 2022-07-01 DIAGNOSIS — I10 Essential (primary) hypertension: Secondary | ICD-10-CM | POA: Diagnosis not present

## 2022-07-01 DIAGNOSIS — Z720 Tobacco use: Secondary | ICD-10-CM | POA: Diagnosis not present

## 2022-07-01 DIAGNOSIS — E785 Hyperlipidemia, unspecified: Secondary | ICD-10-CM | POA: Diagnosis not present

## 2022-07-01 DIAGNOSIS — E119 Type 2 diabetes mellitus without complications: Secondary | ICD-10-CM | POA: Diagnosis not present

## 2022-07-03 ENCOUNTER — Other Ambulatory Visit: Payer: Self-pay | Admitting: Internal Medicine

## 2022-07-06 DIAGNOSIS — N401 Enlarged prostate with lower urinary tract symptoms: Secondary | ICD-10-CM | POA: Diagnosis not present

## 2022-07-06 DIAGNOSIS — N411 Chronic prostatitis: Secondary | ICD-10-CM | POA: Diagnosis not present

## 2022-07-06 NOTE — Progress Notes (Unsigned)
Patient ID: [name], male   DOB: 04-May-1971, 52 y.o.   MRN: [id number]  HPI: [name] is a 52 y.o.-year-old male, returning for f/u for DM2, dx in 2008, non-insulin-dependent, uncontrolled, with complications (CAD, h/o TIA, PN, ED). He prev. Saw Dr. Everardo [name].  Last visit with me 4 months ago.  Interim history: No increased urination, blurry vision, nausea, chest pain. He has prostatitis with increased urination/nocturia and lower abdominal pressure- sees Urology: Dr. Benancio [name].  She was prescribed an antibiotic and will go today to pick it up. Lost 40 pounds since last visit  Reviewed HbA1c: Lab Results  Component Value Date   HGBA1C 6.7 (A) 03/02/2022   HGBA1C 7.2 (A) 10/21/2021   HGBA1C 7.4 (A) 07/22/2021   HGBA1C 7.6 (A) 04/22/2021   HGBA1C 7.4 (A) 02/18/2021   HGBA1C 7.9 (H) 12/05/2020   HGBA1C 7.4 (A) 09/30/2020   HGBA1C 7.5 (A) 01/10/2020   HGBA1C 6.8 (H) 08/29/2019   HGBA1C 6.4 (A) 04/24/2019  In Dr. Annitta [name] office: 12/2021: HbA1c <7% reportedly  At last visit, he was on: - Synjardy (Empagliflozin-Metformin ER 25-1000 mg) in am - stopped 2/2 coverage gap  - Rybelsus 3 mg daily - started 07/2021 He had weight gain with Actos. He had nausea with Repaglinide. Stopped Nateglinide per cardiology.  He has a h/o gall bladder- related pancreatitis 2019.  We changed to: - Metformin ER 1000 mg 2x a day >> Synjardy (Empagliflozin-Metformin) ER 25-1000 mg in am - Rybelsus 7 mg daily in am  Pt checks his sugars >4x a day with his freestyle libre 3 CGM:    Prev.:  Previously:   Lowest sugar was 59 (night) >> 63 >> 54; ? hypoglycemia awareness.  Highest sugar was 246 (cookout) >> 290 >> 200.  Glucometer: One Touch Verio  - no CKD, last BUN/creatinine:  Lab Results  Component Value Date   BUN 12 04/16/2022   BUN 13 10/20/2021   CREATININE 0.82 04/16/2022   CREATININE 0.77 10/20/2021  On Entresto.  - + HL; last set of lipids: Lab Results  Component  Value Date   CHOL 112 04/16/2022   HDL 41.30 04/16/2022   LDLCALC 60 04/16/2022   TRIG 53.0 04/16/2022   CHOLHDL 3 04/16/2022  On Crestor 40 mg daily.  - last eye exam was in 10/2021. No DR.   - no numbness and tingling in his feet. Last foot exam: 02/2022.  He has a h/o: HTN, gout, pulm. Sarcoidosis, seizures, GERD. Also, recurring pericarditis.  ROS: + see HPI  Past Medical History:  Diagnosis Date   Anginal pain (HCC)    Arthritis    ? 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 Followed by Dr. Nickola [name]   Arthritis    "ankles" (04/13/2016)   Asthma    CORONARY ARTERY DISEASE    a. cath 05/2014: mild LAD disease, normal LCx and RCA. Risk factor modification recommended.   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)    MSLT 01/09/09 & MRI brain 01/09/09   Pericarditis    recurrent   POLYNEUROPATHY    PULMONARY SARCOIDOSIS    Mediastinal lymphadenopathy with biospy proven sarcodosis   Seizures (HCC)    "none in 4-5 years; don't know what kind; not related to alcohol" (04/13/2016)   TESTICULAR HYPOFUNCTION  TIA (transient ischemic attack)    "I don't remember when" (04/13/2016)   Past Surgical History:  Procedure Laterality Date   BRONCHOSCOPY  08/21/08   CARDIAC CATHETERIZATION  06/25/2009   minimal disease   CARDIAC CATHETERIZATION N/A 04/14/2016   Procedure: Left Heart Cath and Coronary Angiography;  Surgeon: Burnell Blanks, MD;  Location: [facility];  Service: Cardiovascular;  Laterality: N/A;   CHOLECYSTECTOMY N/A 09/21/2017   Procedure: LAPAROSCOPIC CHOLECYSTECTOMY WITH INTRAOPERATIVE CHOLANGIOGRAM;  Surgeon: Excell Seltzer, MD;  Location: [facility];  Service: General;  Laterality: N/A;   LEFT HEART CATHETERIZATION WITH CORONARY ANGIOGRAM N/A 05/18/2014   Procedure: LEFT HEART CATHETERIZATION WITH  CORONARY ANGIOGRAM;  Surgeon: Jettie Booze, MD;  Location: [facility];  Service: Cardiovascular;  Laterality: N/A;   MEDIASTINOSCOPY  11/30/08   Social History   Socioeconomic History   Marital status: Significant Other    Spouse name: Not on file   Number of children: 3   Years of education: Not on file   Highest education level: Not on file  Occupational History   Occupation: Unemployed  Tobacco Use   Smoking status: Former    Packs/day: 1.00    Years: 10.00    Total pack years: 10.00    Types: Cigarettes    Quit date: 05/18/2009    Years since quitting: 13.1   Smokeless tobacco: Never  Vaping Use   Vaping Use: Never used  Substance and Sexual Activity   Alcohol use: Yes    Comment: rare   Drug use: No   Sexual activity: Yes  Other Topics Concern   Not on file  Social History Narrative   Married, lives with wife and 3 kids.    Currently student. 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 [employer] Strain: [place]  (06/23/2022)   Overall Financial Resource Strain (CARDIA)    Difficulty of Paying Living Expenses: Not hard at [name]  Food Insecurity: No Food Insecurity (06/23/2022)   Hunger Vital Sign    Worried About Running Out of Food in the Last Year: Never true    [place] in the Last Year: Never true  Transportation Needs: No Transportation Needs (06/23/2022)   PRAPARE - Hydrologist (Medical): No    Lack of Transportation (Non-Medical): No  Physical Activity: Sufficiently Active (06/23/2022)   Exercise Vital Sign    Days of Exercise per Week: 7 days    Minutes of Exercise per Session: 30 min  Stress: Stress Concern Present (06/23/2022)   [place]    Feeling of Stress : To some extent  Social Connections: Socially Integrated (06/23/2022)   Social Connection and Isolation Panel [NHANES]    Frequency of  Communication with Friends and Family: More than three times a week    Frequency of Social Gatherings with Friends and Family: More than three times a week    Attends Religious Services: More than 4 times per year    Active Member of [employer] or Organizations: No    Attends Music therapist: More than 4 times per year    Marital Status: Living with partner  Intimate Partner Violence: Not At Risk (06/23/2022)   Humiliation, Afraid, Rape, and Kick questionnaire    Fear of Current or Ex-Partner: No    Emotionally Abused: No    Physically Abused: No  Sexually Abused: No   Current Outpatient Medications on File Prior to Visit  Medication Sig Dispense Refill   albuterol (VENTOLIN HFA) 108 (90 Base) MCG/ACT inhaler Inhale 2 puffs into the lungs every 6 (six) hours as needed for wheezing or shortness of breath. 1 each 3   alfuzosin (UROXATRAL) 10 MG 24 hr tablet Take 10 mg by mouth daily.     allopurinol (ZYLOPRIM) 300 MG tablet TAKE 1 TABLET(300 MG) BY MOUTH DAILY 90 tablet 1   aspirin EC 81 MG tablet Take 1 tablet (81 mg total) by mouth daily. 30 tablet 3   cetirizine (ZYRTEC) 10 MG tablet Take 10 mg by mouth daily.     cholecalciferol (VITAMIN D) 1000 units tablet Take 1,000 Units by mouth daily.     clomiPHENE (CLOMID) 50 MG tablet Take 50 mg by mouth every other day.     colchicine 0.6 MG tablet Take 1 tablet (0.6 mg total) by mouth 2 (two) times daily. 180 tablet 0   Continuous Blood Gluc Sensor (FREESTYLE LIBRE 2 SENSOR) MISC 1 Device by Does not apply route every 14 (fourteen) days. 6 each 3   docusate sodium (COLACE) 100 MG capsule Take 100 mg by mouth daily.     Empagliflozin-metFORMIN HCl ER (SYNJARDY XR) 25-1000 MG TB24 Take 1 tablet by mouth daily. 90 tablet 3   fluticasone (FLONASE) 50 MCG/ACT nasal spray Place 2 sprays into both nostrils daily. 16 g 6   folic acid (FOLVITE) 1 MG tablet Take 1 mg by mouth daily.  1   glucose blood (ONETOUCH VERIO) test strip 1 each by  Other route daily. And lancets 1/day (Patient taking differently: 1 each by Other route daily.) 100 each 11   metoprolol succinate (TOPROL-XL) 50 MG 24 hr tablet Take 50 mg by mouth daily. Take with or immediately following a meal.     nitroGLYCERIN (NITROSTAT) 0.4 MG SL tablet Place 1 tablet (0.4 mg total) under the tongue every 5 (five) minutes as needed for chest pain. 25 tablet 3   pantoprazole (PROTONIX) 40 MG tablet TAKE 2 TABLETS(80 MG) BY MOUTH DAILY 180 tablet 0   rosuvastatin (CRESTOR) 40 MG tablet TAKE 1 TABLET(40 MG) BY MOUTH DAILY 90 tablet 1   sacubitril-valsartan (ENTRESTO) 49-51 MG Take 1 tablet by mouth 2 (two) times daily. Via NOVARTIS pt assistance     saw palmetto 500 MG capsule Take 500 mg by mouth daily.     Semaglutide (RYBELSUS) 7 MG TABS Take 7 mg by mouth daily. 90 tablet 3   tadalafil (CIALIS) 20 MG tablet TAKE ONE TABLET BY MOUTH DAILY AS NEEDED 20 tablet 11   Zinc Sulfate (ZINC 15 PO) Take 1 tablet by mouth daily.     No current facility-administered medications on file prior to visit.   Allergies  Allergen Reactions   Amlodipine Other (See Comments)    Ankle swelling with 10mg , ok with 5mg  dose   Sulfamethoxazole Rash    fever   Sulfonamide Derivatives Rash and Other (See Comments)   Family History  Problem Relation Age of Onset   Diabetes Mother    Hypertension Mother    Colon polyps Mother    Diabetes Father    Heart disease Father 28       Fatal MI   Heart failure Father    Colon polyps Father    Pancreatic cancer Maternal Grandmother    Stomach cancer Maternal Uncle    Pancreatic cancer Maternal Uncle    Colon  cancer Neg Hx    Esophageal cancer Neg Hx    Rectal cancer Neg Hx    Liver disease Neg Hx    PE: BP 120/78 (BP Location: Right Arm, Patient Position: Sitting, Cuff Size: Normal)   Pulse 85   Ht 6\' 3"  (1.905 m)   Wt 256 lb 9.6 oz (116.4 kg)   SpO2 98%   BMI 32.07 kg/m  Wt Readings from Last 3 Encounters:  07/07/22 256 lb 9.6 oz  (116.4 kg)  06/23/22 247 lb (112 kg)  04/16/22 265 lb (120.2 kg)   Constitutional: overweight, in NAD Eyes: EOMI, no exophthalmos ENT: no thyromegaly, no cervical lymphadenopathy Cardiovascular: RRR, No MRG, + LLE mild periankle swelling Respiratory: CTA B Musculoskeletal: no deformities Skin: no rashes Neurological: no tremor with outstretched hands  ASSESSMENT: 1. DM2, non-insulin-dependent, uncontrolled, with complications - CAD - h/o TIA - PN - ED  2. HL  PLAN:  1. Patient with longstanding, uncontrolled, type 2 diabetes, on oral antidiabetic regimen, with oral GLP-1 receptor agonist, metformin, and SGLT2 inhibitor, with improved control.  At last visit, HbA1c was already improved, with an HbA1c of 7.2%, decreased from 7.4%.  At that time, she was without medication for 2 weeks due to the delay in getting that from the pharmacy.  Since he was out of the coverage, we discussed about restarting Synjardy.  He was able to do so since last visit.  He continues Rybelsus, which is well-tolerated. CGM interpretation: -At today's visit, we reviewed his CGM downloads: It appears that 98% of values are in target range (goal >70%), while 2% are higher than 180 (goal <25%), and 0% are lower than 70 (goal <4%).  The calculated average blood sugar is 122.  The projected HbA1c for the next 3 months (GMI) is 6.2%. -Reviewing the CGM trends, sugars are fluctuating within the target range, with only rare, mild, hyperglycemic peaks occasionally after lunch and dinner.  Overall, the control is exemplary so we did not change his regimen. - I suggested to:  Patient Instructions  Please continue: - Rybelsus 7 mg before b'fast - Synjardy 1000-25 mg in am  Please return in 4 months.  - we checked his HbA1c: 6.4% (lower) - advised to check sugars at different times of the day - 1x a day, rotating check times - advised for yearly eye exams >> he is UTD - return to clinic in 4 months  2.   HL -reviewed the latest lipid panel - LDL slightly higher than goal of <55, OTW, fractions are at goal: Lab Results  Component Value Date   CHOL 112 04/16/2022   HDL 41.30 04/16/2022   LDLCALC 60 04/16/2022   TRIG 53.0 04/16/2022   CHOLHDL 3 04/16/2022  -He continues on Crestor 40 mg daily - no SEs -He had a lipid panel obtained by Dr. Terrence Dupont earlier in 2023.  Philemon Kingdom, MD PhD [facility] Endocrinology

## 2022-07-07 ENCOUNTER — Ambulatory Visit: Payer: Medicare Other | Admitting: Internal Medicine

## 2022-07-07 ENCOUNTER — Encounter: Payer: Self-pay | Admitting: Internal Medicine

## 2022-07-07 VITALS — BP 120/78 | HR 85 | Ht 75.0 in | Wt 256.6 lb

## 2022-07-07 DIAGNOSIS — E1159 Type 2 diabetes mellitus with other circulatory complications: Secondary | ICD-10-CM

## 2022-07-07 DIAGNOSIS — E785 Hyperlipidemia, unspecified: Secondary | ICD-10-CM

## 2022-07-07 DIAGNOSIS — E1165 Type 2 diabetes mellitus with hyperglycemia: Secondary | ICD-10-CM | POA: Diagnosis not present

## 2022-07-07 LAB — POCT GLYCOSYLATED HEMOGLOBIN (HGB A1C): Hemoglobin A1C: 6.4 % — AB (ref 4.0–5.6)

## 2022-07-07 NOTE — Patient Instructions (Signed)
Please continue: - Rybelsus 7 mg before b'fast - Synjardy 1000-25 mg in am  Please return in 4 months.

## 2022-07-17 ENCOUNTER — Other Ambulatory Visit: Payer: Self-pay | Admitting: Internal Medicine

## 2022-07-17 DIAGNOSIS — E785 Hyperlipidemia, unspecified: Secondary | ICD-10-CM

## 2022-07-17 DIAGNOSIS — I2510 Atherosclerotic heart disease of native coronary artery without angina pectoris: Secondary | ICD-10-CM

## 2022-07-17 MED ORDER — ROSUVASTATIN CALCIUM 40 MG PO TABS: 40.0000 mg | ORAL_TABLET | Freq: Every day | ORAL | 1 refills | Status: DC

## 2022-08-14 ENCOUNTER — Ambulatory Visit (INDEPENDENT_AMBULATORY_CARE_PROVIDER_SITE_OTHER): Payer: Medicare Other | Admitting: Nurse Practitioner

## 2022-08-14 VITALS — BP 128/88 | HR 80 | Temp 98.2°F | Ht 75.0 in | Wt 251.4 lb

## 2022-08-14 DIAGNOSIS — R350 Frequency of micturition: Secondary | ICD-10-CM

## 2022-08-14 DIAGNOSIS — M545 Low back pain, unspecified: Secondary | ICD-10-CM | POA: Diagnosis not present

## 2022-08-14 DIAGNOSIS — N410 Acute prostatitis: Secondary | ICD-10-CM

## 2022-08-14 DIAGNOSIS — R6883 Chills (without fever): Secondary | ICD-10-CM

## 2022-08-14 LAB — CBC WITH DIFFERENTIAL/PLATELET
Basophils Absolute: 0 10*3/uL (ref 0.0–0.1)
Basophils Relative: 0.7 % (ref 0.0–3.0)
Eosinophils Absolute: 0.3 10*3/uL (ref 0.0–0.7)
Eosinophils Relative: 5.6 % — ABNORMAL HIGH (ref 0.0–5.0)
HCT: 45.1 % (ref 39.0–52.0)
Hemoglobin: 14.9 g/dL (ref 13.0–17.0)
Lymphocytes Relative: 40.5 % (ref 12.0–46.0)
Lymphs Abs: 2.4 10*3/uL (ref 0.7–4.0)
MCHC: 33.1 g/dL (ref 30.0–36.0)
MCV: 81.7 fl (ref 78.0–100.0)
Monocytes Absolute: 0.7 10*3/uL (ref 0.1–1.0)
Monocytes Relative: 11.1 % (ref 3.0–12.0)
Neutro Abs: 2.5 10*3/uL (ref 1.4–7.7)
Neutrophils Relative %: 42.1 % — ABNORMAL LOW (ref 43.0–77.0)
Platelets: 228 10*3/uL (ref 150.0–400.0)
RBC: 5.52 Mil/uL (ref 4.22–5.81)
RDW: 15 % (ref 11.5–15.5)
WBC: 6 10*3/uL (ref 4.0–10.5)

## 2022-08-14 LAB — COMPREHENSIVE METABOLIC PANEL
ALT: 18 U/L (ref 0–53)
AST: 19 U/L (ref 0–37)
Albumin: 4.1 g/dL (ref 3.5–5.2)
Alkaline Phosphatase: 62 U/L (ref 39–117)
BUN: 13 mg/dL (ref 6–23)
CO2: 28 mEq/L (ref 19–32)
Calcium: 9.7 mg/dL (ref 8.4–10.5)
Chloride: 105 mEq/L (ref 96–112)
Creatinine, Ser: 0.77 mg/dL (ref 0.40–1.50)
GFR: 105 mL/min (ref >60.00)
Glucose, Bld: 131 mg/dL — ABNORMAL HIGH (ref 70–99)
Potassium: 4 mEq/L (ref 3.5–5.1)
Sodium: 141 mEq/L (ref 135–145)
Total Bilirubin: 0.3 mg/dL (ref 0.2–1.2)
Total Protein: 7.3 g/dL (ref 6.0–8.3)

## 2022-08-14 LAB — POCT URINALYSIS DIPSTICK
Bilirubin, UA: NEGATIVE
Blood, UA: NEGATIVE
Glucose, UA: POSITIVE — AB
Ketones, UA: NEGATIVE
Leukocytes, UA: NEGATIVE
Nitrite, UA: NEGATIVE
Protein, UA: POSITIVE — AB
Spec Grav, UA: 1.015 (ref 1.010–1.025)
Urobilinogen, UA: 0.2 E.U./dL
pH, UA: 6 (ref 5.0–8.0)

## 2022-08-14 LAB — POC COVID19 BINAXNOW: SARS Coronavirus 2 Ag: NEGATIVE

## 2022-08-14 LAB — POCT INFLUENZA A/B
Influenza A, POC: NEGATIVE
Influenza B, POC: NEGATIVE

## 2022-08-14 LAB — POCT RESPIRATORY SYNCYTIAL VIRUS: RSV Rapid Ag: NEGATIVE

## 2022-08-14 MED ORDER — CIPROFLOXACIN HCL 500 MG PO TABS: 500.0000 mg | ORAL_TABLET | Freq: Two times a day (BID) | ORAL | 0 refills | Status: DC

## 2022-08-14 NOTE — Assessment & Plan Note (Addendum)
Symptoms and history seem consistent with acute prostatitis, VSS. Patient does not look acutely ill no signs of sepsis noted.  Prostate enlarged and slightly tender on exam, no nodules felt. U/A negative for signs of infection, glucose seen on urine but patient is on empagliflozin.  Treat with ciprofloxacin 500 mg tablets twice daily x 4 weeks.  Will refer patient as well back to Dr. Milford Cage his urologist for follow-up evaluation.  Patient encouraged to call office if symptoms persist or worsen. Patient educated on possible side effects of cipro and what to do if these occur.   Will also order labs to check for UTI, kidney dysfunction, STIs. Further recommendations may be made based on these results.

## 2022-08-14 NOTE — Progress Notes (Signed)
Established Patient Office Visit  Subjective   Patient ID: [name], male    DOB: 06-26-1970  Age: 52 y.o. MRN: [id number]  Chief Complaint  Patient presents with   Back Pain    Middle part of the lower, affecting his walking and sitting  Affecting his urine, frequency    Symptom onset 3 days ago.  Reports low back pain midline that radiates around to his abdomen.  Reports has been getting progressively worse, 10 for 10 in intensity constant.  Did have fever and chills yesterday, checked temperature at home reports it was 101.  Took Tylenol this morning at 6 AM.  This has not been helping pain.  Has had some nausea, no vomiting.  Pain in back worsens with urination.  Denies visible hematuria.  Denies history of UTI.  Denies known exposure to STI or new sexual partners.   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  Reports a few days ago having to lift heavier body than usual.  Denies any current movements of back such as bending or twisting as a aggravator of current symptoms.   Per chart review patient does have history of acute prostatitis back in 2019.  He reports that symptoms today remind him of that illness but seem a bit more severe.  Also reports that he was recently treated for prostatitis with urologist within the last couple of months but took antibiotics for approximately 1 week.  Upon further questioning he does feel discomfort and pain in his rectal area upon sitting to the point where he brings a pillow with him when he needs to sit for prolonged periods of time.    Review of Systems  Constitutional:  Positive for chills and fever (last night was 101).  HENT:  Positive for congestion.   Respiratory:  Negative for cough.   Gastrointestinal:  Positive for nausea. Negative for diarrhea and vomiting.  Musculoskeletal:  Positive for back pain.      Objective:     BP 128/88   Pulse 80   Temp 98.2 F (36.8 C) (Temporal)   Ht '6\' 3"'$   (1.905 m)   Wt 251 lb 6 oz (114 kg)   SpO2 98%   BMI 31.42 kg/m    Physical Exam Vitals reviewed. Exam conducted with a chaperone present.  Constitutional:      Appearance: Normal appearance.  HENT:     Head: Normocephalic and atraumatic.  Cardiovascular:     Rate and Rhythm: Normal rate and regular rhythm.  Pulmonary:     Effort: Pulmonary effort is normal.     Breath sounds: Normal breath sounds.  Genitourinary:    Prostate: Enlarged and tender (slight). No nodules present.     Rectum: Normal.  Musculoskeletal:     Cervical back: Neck supple.  Skin:    General: Skin is warm and dry.  Neurological:     Mental Status: He is alert and oriented to person, place, and time.  Psychiatric:        Mood and Affect: Mood normal.        Behavior: Behavior normal.        Thought Content: Thought content normal.        Judgment: Judgment normal.      Results for orders placed or performed in visit on 08/14/22  POCT urinalysis dipstick  Result Value Ref Range   Color, UA yellow    Clarity, UA clear  Glucose, UA Positive (A) Negative   Bilirubin, UA negative    Ketones, UA negative    Spec Grav, UA 1.015 1.010 - 1.025   Blood, UA negative    pH, UA 6.0 5.0 - 8.0   Protein, UA Positive (A) Negative   Urobilinogen, UA 0.2 0.2 or 1.0 E.U./dL   Nitrite, UA negative    Leukocytes, UA Negative Negative   Appearance     Odor    POC COVID-19  Result Value Ref Range   SARS Coronavirus 2 Ag Negative Negative  POCT Influenza A/B  Result Value Ref Range   Influenza A, POC Negative Negative   Influenza B, POC Negative Negative  POCT respiratory syncytial virus  Result Value Ref Range   RSV Rapid Ag negative       The ASCVD Risk score (Arnett DK, et al., 2019) failed to calculate for the following reasons:   The valid total cholesterol range is 130 to 320 mg/dL    Assessment & Plan:   Problem List Items Addressed This Visit       Genitourinary   Acute prostatitis -  Primary    Symptoms and history seem consistent with acute prostatitis, VSS. Patient does not look acutely ill no signs of sepsis noted.  Prostate enlarged and slightly tender on exam, no nodules felt. U/A negative for signs of infection, glucose seen on urine but patient is on empagliflozin.  Treat with ciprofloxacin 500 mg tablets twice daily x 4 weeks.  Will refer patient as well back to Dr. Milford Cage his urologist for follow-up evaluation.  Patient encouraged to call office if symptoms persist or worsen. Patient educated on possible side effects of cipro and what to do if these occur.   Will also order labs to check for UTI, kidney dysfunction, STIs. Further recommendations may be made based on these results.        Relevant Medications   ciprofloxacin (CIPRO) 500 MG tablet   Other Relevant Orders   POCT urinalysis dipstick (Completed)   Urine Culture   Chlamydia/Neisseria Gonorrhoeae RNA,TMA,Urogenital   HIV Antibody (routine testing w rflx)   RPR   CBC with Differential/Platelet   Comprehensive metabolic panel   POC [id number] (Completed)   POCT Influenza A/B (Completed)   POCT respiratory syncytial virus (Completed)   Ambulatory referral to Urology   Other Visit Diagnoses     Urinary frequency       Relevant Orders   POCT urinalysis dipstick (Completed)   Urine Culture   Chlamydia/Neisseria Gonorrhoeae RNA,TMA,Urogenital   HIV Antibody (routine testing w rflx)   RPR   CBC with Differential/Platelet   Comprehensive metabolic panel   Chills       Relevant Orders   POC COVID-19 (Completed)   POCT Influenza A/B (Completed)   POCT respiratory syncytial virus (Completed)       Return if symptoms worsen or fail to improve. Total time spent on encounter today was 35 minutes including face-to-face interaction/evaluation, review of records, and development/discussion of treatment plan.   Ailene Ards, NP

## 2022-08-15 LAB — URINE CULTURE: Result:: NO GROWTH

## 2022-08-16 LAB — HIV ANTIBODY (ROUTINE TESTING W REFLEX): HIV 1&2 Ab, 4th Generation: NONREACTIVE

## 2022-08-16 LAB — RPR: RPR Ser Ql: NONREACTIVE

## 2022-08-16 LAB — CHLAMYDIA/NEISSERIA GONORRHOEAE RNA,TMA,UROGENTIAL
C. trachomatis RNA, TMA: NOT DETECTED
N. gonorrhoeae RNA, TMA: NOT DETECTED

## 2022-09-14 DIAGNOSIS — E119 Type 2 diabetes mellitus without complications: Secondary | ICD-10-CM | POA: Diagnosis not present

## 2022-09-14 DIAGNOSIS — N401 Enlarged prostate with lower urinary tract symptoms: Secondary | ICD-10-CM | POA: Diagnosis not present

## 2022-09-14 DIAGNOSIS — I10 Essential (primary) hypertension: Secondary | ICD-10-CM | POA: Diagnosis not present

## 2022-09-14 DIAGNOSIS — E785 Hyperlipidemia, unspecified: Secondary | ICD-10-CM | POA: Diagnosis not present

## 2022-10-13 ENCOUNTER — Ambulatory Visit: Payer: Medicare Other | Admitting: Internal Medicine

## 2022-10-13 ENCOUNTER — Encounter: Payer: Self-pay | Admitting: Internal Medicine

## 2022-10-13 VITALS — BP 120/82 | HR 76 | Ht 75.0 in | Wt 249.6 lb

## 2022-10-13 DIAGNOSIS — R634 Abnormal weight loss: Secondary | ICD-10-CM

## 2022-10-13 DIAGNOSIS — R053 Chronic cough: Secondary | ICD-10-CM

## 2022-10-13 DIAGNOSIS — R0789 Other chest pain: Secondary | ICD-10-CM | POA: Diagnosis not present

## 2022-10-13 DIAGNOSIS — R002 Palpitations: Secondary | ICD-10-CM | POA: Diagnosis not present

## 2022-10-13 DIAGNOSIS — D869 Sarcoidosis, unspecified: Secondary | ICD-10-CM | POA: Diagnosis not present

## 2022-10-13 DIAGNOSIS — R0609 Other forms of dyspnea: Secondary | ICD-10-CM | POA: Diagnosis not present

## 2022-10-13 DIAGNOSIS — R942 Abnormal results of pulmonary function studies: Secondary | ICD-10-CM

## 2022-10-13 LAB — CBC WITH DIFFERENTIAL/PLATELET
Basophils Absolute: 0 10*3/uL (ref 0.0–0.1)
Basophils Relative: 0.8 % (ref 0.0–3.0)
Eosinophils Absolute: 0.2 10*3/uL (ref 0.0–0.7)
Eosinophils Relative: 4.3 % (ref 0.0–5.0)
HCT: 46.4 % (ref 39.0–52.0)
Hemoglobin: 15.3 g/dL (ref 13.0–17.0)
Lymphocytes Relative: 41.5 % (ref 12.0–46.0)
Lymphs Abs: 2.3 10*3/uL (ref 0.7–4.0)
MCHC: 33 g/dL (ref 30.0–36.0)
MCV: 81.8 fl (ref 78.0–100.0)
Monocytes Absolute: 0.6 10*3/uL (ref 0.1–1.0)
Monocytes Relative: 10.4 % (ref 3.0–12.0)
Neutro Abs: 2.4 10*3/uL (ref 1.4–7.7)
Neutrophils Relative %: 43 % (ref 43.0–77.0)
Platelets: 227 10*3/uL (ref 150.0–400.0)
RBC: 5.67 Mil/uL (ref 4.22–5.81)
RDW: 15.3 % (ref 11.5–15.5)
WBC: 5.5 10*3/uL (ref 4.0–10.5)

## 2022-10-13 LAB — SEDIMENTATION RATE: Sed Rate: 26 mm/hr — ABNORMAL HIGH (ref 0–15)

## 2022-10-13 NOTE — Progress Notes (Signed)
04/23/2016 Acute OV : Sarcoid   52 yo male followed for Sarcoid w/ pulmonary /neuro/joint involvement , and mild persistent asthma.   TEST  Spirometry 01/14/11 >> FEV1 3.15 (78%), FVC 3.68 (72%), FEV1% 86  Spirometry 12/09/11 >> FEV1 3.25 (80%), FVC 3.85 (76%), FEV1% 84    Last seen in office 3.5 yr ago. Presents today for Sarcoid flare . Complains of joint pain , chest tightness, and increased sob, and dry cough for last couple of months. Worse for last 2 weeks.  Previously on MTX thru Rheumatology. Says he ran out of refills and Rheumatologist would not refill. Marland Kitchen Has been off for over 1-2 years ago.  Was admitted 04/13/16 with chest pain , underwent cardiac cath that showed mild non obstructive CAD. CXR showed nad. Nml H/H.  He denies fever, discolored mucus, orthopena, edema or hemotpysis. Occasional rash but nothing right now  OV 01/25/2020  Subjective:  Patient ID: [name], male , DOB: Aug 14, 1970 , age 25 y.o. , MRN: [id number] , ADDRESS: [address] [place] [address]  PCP [name], MD  01/25/2020 -   Chief Complaint  Patient presents with   Consult    No complaints    HPI MILAD KEHM 52 y.o. -this is a new consult because he is reestablishing care. He was last seen in our practice more than 3 years ago. Therefore this is a new visit. 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 He tells me that in 2010 he had sarcoidosis diagnosed when he had diffuse arthralgia shortness of breath cough fever, malaise weight loss [B symptoms] and at that point in time also had arthralgia. He also had seizures. Has not had seizures now for 6 years. He was diagnosed with sarcoidosis. He was placed on methotrexate once a week which she is continued since 2010. His last seizure was 6 years ago. At baseline now he only has mild shortness of breath with exertion but there is also some variability with it when he exerts at some points he does not have shortness of  breath but at some point he does have shortness of breath. There is no shortness of breath at rest. He has some baseline arthralgia. He says overall methotrexate is doing well for him. Then in the background of all this couple of times a year he will have a respiratory flare along with joint pain and tiredness. For this he will be treated with a short course of prednisone. He is not on maintenance prednisone. He is just immunosuppressed with methotrexate. Then most recently in July 2021 he had another flareup with joint pain tiredness shortness of breath but no fevers. However he did have night sweats. He was given 10-day prednisone and is now nearly back to his baseline. His shortness of breath is baseline but he still has a residual cough that is occasional but it is improving. He decided to reestablish with a pulmonologist and therefore is here to see me. He wants to make sure his lungs are okay.  Of note he has significant baseline environmental allergies. He reports being "awful with pollen" not otherwise specified.   His left ventricular ejection fraction in May 2021 was 55-60%. Diastolic parameters were considered normal.  His blood work in August 2021 showed creatinine normal 0.75 mg percent. Hemoglobin normal 14.3 g%. His blood eosinophils was 100 cells per cubic millimeter.  His hemoglobin A1c was elevated at 7.5   His CT abdomen lung images  in August 2021 was clear   ROS - per HPI     OV 03/21/2020  Subjective:  Patient ID: [name], male , DOB: Jan 17, 1971 , age 36 y.o. , MRN: [id number] , ADDRESS: [address] [place] [address] PCP [name], MD Patient Care Team: [name], MD as PCP - General (Internal Medicine) Lars Masson, MD as PCP - Cardiology (Cardiology) Virgilio Frees as Consulting Physician (Rheumatology) Loyal Gambler, MD as Referring Physician (Rheumatology) [name], MD as Consulting Physician (Pulmonary  Disease) Romero Belling, MD as Consulting Physician (Endocrinology) Lars Masson, MD (Cardiology) Kathyrn Sheriff, [facility] as Pharmacist (Pharmacist)  This Provider for this visit: Treatment Team:  Attending Provider: Kalman Shan, MD    03/21/2020 -   Chief Complaint  Patient presents with   Follow-up    Hx of Sarcoidosis     HPI JACEION HUESCA 52 y.o. -presents for follow-up to discuss his test results.  He did not do the CBC and blood IgE and allergy profile.  Nevertheless overall his cough is nearly resolved with time.  He only has very mild shortness of breath on occasion.  He had high-resolution CT chest that did not show any evidence of ILD or mediastinal adenopathy.  No evidence of overt sarcoidosis.  Occasional scattered 3 mm nodules.  There is no evidence of lung cancer pneumonia emphysema either.  With asthma being the other differential diagnosis we checked exhaled nitric oxide test today and it is 24 and normal.  He had full pulmonary function test that shows variations particularly with a flow volume loop as the major abnormality.  No results found for: NITRICOXIDE   No results found for: NITRICOXIDE   CT chest high resolution 02/07/20  IMPRESSION: 1. No evidence of fibrotic interstitial lung disease. 2. Occasional tiny pulmonary nodules, the largest a 3 mm pulmonary nodule of the right pulmonary apex. No follow-up needed if patient is low-risk (and has no known or suspected primary neoplasm). Non-contrast chest CT can be considered in 12 months if patient is high-risk. This recommendation follows the consensus statement: Guidelines for Management of Incidental Pulmonary Nodules Detected on CT Images: From the Fleischner Society 2017; Radiology 2017; 284:228-243. 3. Coronary artery disease.  Aortic Atherosclerosis (ICD10-I70.0).     Electronically Signed   By: Lauralyn Primes M.D.   On: 02/07/2020 12:05    ROS - per HPI   Results for DAEGAN, RYCROFT (MRN [id number]) as of 03/21/2020 10:06  Ref. Range 08/10/2008 17:12 08/10/2008 20:52  Anti Nuclear Antibody (ANA) Latest Ref Range: NEGATIVE   NEG  Angiotensin 1 Converting Enzyme Latest Ref Range: 9 - 67 units/L  38  RA Latex Turbid. Latest Ref Range: 0.0 - 20.0 intl units/mL < 20.0 IU/mL (L)     Results for HARUTO, RIES (MRN [id number]) as of 03/21/2020 10:06  Ref. Range 11/03/2017 09:32 05/24/2018 11:38 06/14/2018 15:56 08/29/2019 08:47 01/09/2020 08:11  Eosinophils Absolute Latest Ref Range: 0.0 - 0.5 K/uL 0.2 0.2 0.2 0.1 0.1      OV 10/13/2022  Subjective:  Patient ID: [name], male , DOB: 05/04/1971 , age 11 y.o. , MRN: [id number] , ADDRESS: [address] [place] [address] PCP [name], MD Patient Care Team: [name], MD as PCP - General (Internal Medicine) Lars Masson, MD as PCP - Cardiology (Cardiology) Loyal Gambler, MD as Referring Physician (Rheumatology) [name], MD as Consulting Physician (Pulmonary Disease) Lars Masson, MD (  Cardiology) Mateo Flow, MD as Consulting Physician (Ophthalmology) Carlus Pavlov, MD as Consulting Physician (Endocrinology) Meryl Dare, MD as Consulting Physician (Gastroenterology)  This Provider for this visit: Treatment Team:  Attending Provider: Kalman Shan, MD    10/13/2022 -   Chief Complaint  Patient presents with   Follow-up    Sarcoidosis, sob, fatigue, wheezing, and cough.   Has history of sarcoidosis but 2021 CT chest with micronodules.  He had abnormal flow-volume loop pattern.  HPI ADEKUNLE MEECE 52 y.o. -returns for follow-up.  Not seen in 2-1/2 years.  States he was doing well till early 2024 and started having unintentional weight loss for the last 4 months.  Then for the last few weeks having constellation of B symptoms.  Having shortness of breath.  Having dry cough with wheezing having fatigue having pain in the upper back having knee  arthralgia.  Having heart palpitations.  Also having night sweats but no fever.  No rashes no erythema nodosum no chills.  Symptoms are reminiscent to him of previous diagnosis of sarcoidosis.  This morning he also woke up and had some atypical chest pain in the sternal area associated with cough.  He has a cardiologist Dr. Sharyn Lull and has not contacted him about the chest pain.  It was not exertional and there is no diaphoresis there is no radiation.  It was associated with cough.  He is worried about the symptoms.  Medical assistant tried to make him do a nitric oxide exhaled test but he could not perform the maneuver.  In the past he had abnormal flow-volume pattern with his pulmonary function testing.  I referred him to ENT but I doubt he has seen ENT.     FENO  10/13/2022   -No results found for: "NITRICOXIDE" - x 4 times     PFT     Latest Ref Rng & Units 03/21/2020    9:00 AM  PFT Results  FVC-Pre L 3.71   FVC-Predicted Pre % 73   FVC-Post L 3.47   FVC-Predicted Post % 69   Pre FEV1/FVC % % 88   Post FEV1/FCV % % 85   FEV1-Pre L 3.25   FEV1-Predicted Pre % 80   FEV1-Post L 2.96   DLCO uncorrected ml/min/mmHg 28.93   DLCO UNC% % 83   DLCO corrected ml/min/mmHg 28.46   DLCO COR %Predicted % 82   DLVA Predicted % 128   TLC L 5.29   TLC % Predicted % 66   RV % Predicted % 83      Latest Reference Range & Units 07/30/08 09:50 08/10/08 17:12 10/02/08 17:07 11/06/08 16:44 03/01/09 17:19 03/27/09 09:46 05/14/09 11:04 06/17/09 14:05 06/24/09 12:36 07/01/09 23:00 07/22/09 23:29 01/07/10 11:13 01/12/10 16:13 01/31/10 11:25 04/11/10 10:32 06/24/10 15:20 01/14/11 14:35 01/26/11 02:38 07/09/11 09:46 11/22/13 16:08 05/14/14 13:49 06/29/17 09:23 09/15/17 14:38 09/17/17 11:24 09/19/17 05:00 11/03/17 09:32 05/24/18 11:38 06/14/18 15:56 08/29/19 08:47 01/09/20 08:11 07/05/20 09:17 10/20/21 04:47 08/14/22 09:50  Eosinophils Absolute 0.0 - 0.7 K/uL 0.2 0.3 0.0 0.0 0.0 0.2 0.2 0.3 0.2 0.3 0.3 0.2  0.1 0.2 0.2 0.3 0.4 0.1 0.3 0.2 0.3 0.2 0.2 0.3 0.2 0.2 0.2 0.2 0.1 0.1 0.1 0.2 0.3     has a past medical history of Anginal pain (HCC), Arthritis, Arthritis, Asthma, CORONARY ARTERY DISEASE, DIABETES MELLITUS, TYPE II, Edema, ERECTILE DYSFUNCTION, ORGANIC, GERD, Gout, unspecified, Heart murmur, HYPERLIPIDEMIA, HYPERTENSION, Narcolepsy without cataplexy(347.00), Pericarditis, POLYNEUROPATHY, PULMONARY SARCOIDOSIS, Seizures (HCC), TESTICULAR HYPOFUNCTION, and TIA (transient  ischemic attack).   reports that he quit smoking about 13 years ago. His smoking use included cigarettes. He has a 10.00 pack-year smoking history. He has never used smokeless tobacco.  Past Surgical History:  Procedure Laterality Date   BRONCHOSCOPY  08/21/08   CARDIAC CATHETERIZATION  06/25/2009   minimal disease   CARDIAC CATHETERIZATION N/A 04/14/2016   Procedure: Left Heart Cath and Coronary Angiography;  Surgeon: Kathleene Hazel, MD;  [facility] INVASIVE CV LAB;  Service: Cardiovascular;  Laterality: N/A;   CHOLECYSTECTOMY N/A 09/21/2017   Procedure: LAPAROSCOPIC CHOLECYSTECTOMY WITH INTRAOPERATIVE CHOLANGIOGRAM;  Surgeon: Glenna Fellows, MD;  Location: [facility] ORS;  Service: General;  Laterality: N/A;   LEFT HEART CATHETERIZATION WITH CORONARY ANGIOGRAM N/A 05/18/2014   Procedure: LEFT HEART CATHETERIZATION WITH CORONARY ANGIOGRAM;  Surgeon: Corky Crafts, MD;  Location: [facility] CATH LAB;  Service: Cardiovascular;  Laterality: N/A;   MEDIASTINOSCOPY  11/30/08    Allergies  Allergen Reactions   Amlodipine Other (See Comments)    Ankle swelling with 10mg , ok with 5mg  dose   Sulfamethoxazole Rash    fever   Sulfonamide Derivatives Rash and Other (See Comments)    Immunization History  Administered Date(s) Administered   COVID-19, mRNA, vaccine(Comirnaty)12 years and older 02/20/2022   Influenza Split 04/06/2011, 02/17/2012, 03/16/2012   Influenza, Quadrivalent, Recombinant, Inj, Pf 02/20/2022    Influenza,inj,Quad PF,6+ Mos 03/21/2013, 03/08/2014, 04/03/2015, 02/19/2016, 02/17/2017, 06/14/2018, 02/20/2019, 03/21/2020   Influenza-Unspecified 03/08/2021   Moderna Sars-Covid-2 Vaccination 07/31/2019, 09/05/2019, 04/19/2020, 02/20/2022   Pneumococcal Conjugate-13 04/29/2015   Pneumococcal Polysaccharide-23 11/06/2015   Tdap 03/21/2013    Family History  Problem Relation Age of Onset   Diabetes Mother    Hypertension Mother    Colon polyps Mother    Diabetes Father    Heart disease Father 29       Fatal MI   Heart failure Father    Colon polyps Father    Pancreatic cancer Maternal Grandmother    Stomach cancer Maternal Uncle    Pancreatic cancer Maternal Uncle    Colon cancer Neg Hx    Esophageal cancer Neg Hx    Rectal cancer Neg Hx    Liver disease Neg Hx      Current Outpatient Medications:    albuterol (VENTOLIN HFA) 108 (90 Base) MCG/ACT inhaler, Inhale 2 puffs into the lungs every 6 (six) hours as needed for wheezing or shortness of breath., Disp: 1 each, Rfl: 3   alfuzosin (UROXATRAL) 10 MG 24 hr tablet, Take 10 mg by mouth daily., Disp: , Rfl:    allopurinol (ZYLOPRIM) 300 MG tablet, TAKE 1 TABLET(300 MG) BY MOUTH DAILY, Disp: 90 tablet, Rfl: 1   aspirin EC 81 MG tablet, Take 1 tablet (81 mg total) by mouth daily., Disp: 30 tablet, Rfl: 3   cetirizine (ZYRTEC) 10 MG tablet, Take 10 mg by mouth daily., Disp: , Rfl:    cholecalciferol (VITAMIN D) 1000 units tablet, Take 1,000 Units by mouth daily., Disp: , Rfl:    ciprofloxacin (CIPRO) 500 MG tablet, Take 1 tablet (500 mg total) by mouth 2 (two) times daily., Disp: 56 tablet, Rfl: 0   clomiPHENE (CLOMID) 50 MG tablet, Take 50 mg by mouth every other day., Disp: , Rfl:    colchicine 0.6 MG tablet, Take 1 tablet (0.6 mg total) by mouth 2 (two) times daily., Disp: 180 tablet, Rfl: 0   Continuous Blood Gluc Sensor (FREESTYLE LIBRE 2 SENSOR) MISC, 1 Device by Does not apply route every 14 (fourteen)  days., Disp: 6 each,  Rfl: 3   docusate sodium (COLACE) 100 MG capsule, Take 100 mg by mouth daily., Disp: , Rfl:    Empagliflozin-metFORMIN HCl ER (SYNJARDY XR) 25-1000 MG TB24, Take 1 tablet by mouth daily., Disp: 90 tablet, Rfl: 3   fluticasone (FLONASE) 50 MCG/ACT nasal spray, Place 2 sprays into both nostrils daily., Disp: 16 g, Rfl: 6   folic acid (FOLVITE) 1 MG tablet, Take 1 mg by mouth daily., Disp: , Rfl: 1   glucose blood (ONETOUCH VERIO) test strip, 1 each by Other route daily. And lancets 1/day (Patient taking differently: 1 each by Other route daily.), Disp: 100 each, Rfl: 11   metoprolol succinate (TOPROL-XL) 50 MG 24 hr tablet, Take 50 mg by mouth daily. Take with or immediately following a meal., Disp: , Rfl:    nitroGLYCERIN (NITROSTAT) 0.4 MG SL tablet, Place 1 tablet (0.4 mg total) under the tongue every 5 (five) minutes as needed for chest pain., Disp: 25 tablet, Rfl: 3   pantoprazole (PROTONIX) 40 MG tablet, TAKE 2 TABLETS(80 MG) BY MOUTH DAILY, Disp: 180 tablet, Rfl: 0   rosuvastatin (CRESTOR) 40 MG tablet, Take 1 tablet (40 mg total) by mouth daily., Disp: 90 tablet, Rfl: 1   sacubitril-valsartan (ENTRESTO) 49-51 MG, Take 1 tablet by mouth 2 (two) times daily. Via NOVARTIS pt assistance, Disp: , Rfl:    saw palmetto 500 MG capsule, Take 500 mg by mouth daily., Disp: , Rfl:    Semaglutide (RYBELSUS) 7 MG TABS, Take 7 mg by mouth daily., Disp: 90 tablet, Rfl: 3   tadalafil (CIALIS) 20 MG tablet, TAKE ONE TABLET BY MOUTH DAILY AS NEEDED, Disp: 20 tablet, Rfl: 11   Zinc Sulfate (ZINC 15 PO), Take 1 tablet by mouth daily., Disp: , Rfl:       Objective:   Vitals:   10/13/22 1125  BP: 120/82  Pulse: 76  SpO2: 97%  Weight: 249 lb 9.6 oz (113.2 kg)  Height: 6\' 3"  (1.905 m)    Estimated body mass index is 31.2 kg/m as calculated from the following:   Height as of this encounter: 6\' 3"  (1.905 m).   Weight as of this encounter: 249 lb 9.6 oz (113.2 kg).  @WEIGHTCHANGE @  [employer]    10/13/22 1125  Weight: 249 lb 9.6 oz (113.2 kg)     Physical Exam  General: No distress. Looks well Neuro: Alert and Oriented x 3. GCS 15. Speech normal Psych: Pleasant Resp:  Barrel Chest - no.  Wheeze - no, Crackles - no, No overt respiratory distress CVS: Normal heart sounds. Murmurs - o Ext: Stigmata of Connective Tissue Disease - no HEENT: Normal upper airway. PEERL +. No post nasal drip        Assessment:       ICD-10-CM   1. Sarcoidosis (HCC)  D86.9 POCT EXHALED NITRIC OXIDE    2. DOE (dyspnea on exertion)  R06.09     3. Chronic cough  R05.3     4. Palpitations  R00.2     5. Atypical chest pain  R07.89     6. Weight loss, unintentional  R63.4     7. Abnormal flow volume loop  R94.2          Plan:     Patient Instructions     ICD-10-CM   1. Sarcoidosis (HCC)  D86.9 POCT EXHALED NITRIC OXIDE    2. DOE (dyspnea on exertion)  R06.09     3. Chronic cough  R05.3     4. Palpitations  R00.2     5. Atypical chest pain  R07.89     6. Abnormal flow volume loop  R94.2       Unclear nature of symptoms. Could be sarcoid or sarcoid mimic or something else  Plan - Do blood cbc with diff, IgE RAST allergy profile 10/13/2022 - do ANA, RF, CCP, ACE , ESR bloor work 10/13/2022 - do QUantiferon Gold 10/13/2022 - do troponin 10/13/2022   - - do HRCT supine and prone - do full PFT  - do echo - call DR Harwani and report palpitations and chest pains   Followup - APP in 4 weeks to discuss results    SIGNATURE    Dr. Kalman Shan, M.D., F.C.C.P,  Pulmonary and Critical Care Medicine Staff Physician, [facility] Center Director - Interstitial Lung Disease  Program  Pulmonary Fibrosis [facility] at [facility] [place], [place], [address]  Pager: [phone], If no answer or between  15:00h - 7:00h: call [phone] Telephone: [phone]  12:07 PM 10/13/2022

## 2022-10-13 NOTE — Patient Instructions (Addendum)
ICD-10-CM   1. Sarcoidosis (HCC)  D86.9 POCT EXHALED NITRIC OXIDE    2. DOE (dyspnea on exertion)  R06.09     3. Chronic cough  R05.3     4. Palpitations  R00.2     5. Atypical chest pain  R07.89     6. Abnormal flow volume loop  R94.2       Unclear nature of symptoms. Could be sarcoid or sarcoid mimic or something else  Plan - Do blood cbc with diff, IgE RAST allergy profile 10/13/2022 - do ANA, RF, CCP, ACE , ESR bloor work 10/13/2022 - do QUantiferon Gold 10/13/2022 - do troponin 10/13/2022   - - do HRCT supine and prone - do full PFT  - do echo - call DR Harwani and report palpitations and chest pains   Followup - APP in 4 weeks to discuss results

## 2022-10-14 LAB — RHEUMATOID FACTOR: Rheumatoid fact SerPl-aCnc: <10 IU/mL (ref <14)

## 2022-10-15 LAB — QUANTIFERON-TB GOLD PLUS
NIL: 0.02 IU/mL
TB2-NIL: 0 IU/mL

## 2022-10-15 LAB — ANGIOTENSIN CONVERTING ENZYME: Angiotensin-Converting Enzyme: 28 U/L (ref 9–67)

## 2022-10-15 LAB — CYCLIC CITRUL PEPTIDE ANTIBODY, IGG: Cyclic Citrullin Peptide Ab: <16 UNITS

## 2022-10-16 LAB — TROPONIN T, HIGH SENSITIVITY (HS-TNT)

## 2022-10-16 LAB — QUANTIFERON-TB GOLD PLUS
Mitogen-NIL: >10 IU/mL
QuantiFERON-TB Gold Plus: NEGATIVE
TB1-NIL: 0 IU/mL

## 2022-10-19 LAB — ALLERGEN PROFILE, PERENNIAL ALLERGEN IGE
Alternaria Alternata IgE: 3.24 kU/L — AB
Aspergillus Fumigatus IgE: <0.1 kU/L
Aureobasidi Pullulans IgE: <0.1 kU/L
Candida Albicans IgE: <0.1 kU/L
Cat Dander IgE: <0.1 kU/L
Chicken Feathers IgE: <0.1 kU/L
Cladosporium Herbarum IgE: <0.1 kU/L
Cow Dander IgE: <0.1 kU/L
D Farinae IgE: <0.1 kU/L
D Pteronyssinus IgE: <0.1 kU/L
Dog Dander IgE: 0.14 kU/L — AB
Duck Feathers IgE: <0.1 kU/L
Goose Feathers IgE: <0.1 kU/L
Mouse Urine IgE: <0.1 kU/L
Mucor Racemosus IgE: <0.1 kU/L
Penicillium Chrysogen IgE: <0.1 kU/L
Phoma Betae IgE: <0.1 kU/L
Setomelanomma Rostrat: <0.1 kU/L
Stemphylium Herbarum IgE: 0.1 kU/L — AB

## 2022-10-19 LAB — ANA+ENA+DNA/DS+SCL 70+SJOSSA/B
ANA Titer 1: NEGATIVE
ENA RNP Ab: <0.2 AI (ref 0.0–0.9)
ENA SM Ab Ser-aCnc: <0.2 AI (ref 0.0–0.9)
ENA SSA (RO) Ab: <0.2 AI (ref 0.0–0.9)
ENA SSB (LA) Ab: <0.2 AI (ref 0.0–0.9)
Scleroderma (Scl-70) (ENA) Antibody, IgG: <0.2 AI (ref 0.0–0.9)
dsDNA Ab: <1 IU/mL (ref 0–9)

## 2022-10-20 ENCOUNTER — Ambulatory Visit (HOSPITAL_BASED_OUTPATIENT_CLINIC_OR_DEPARTMENT_OTHER)
Admission: RE | Admit: 2022-10-20 | Discharge: 2022-10-20 | Disposition: A | Discharge status: Home / Self Care | Payer: Medicare Other | Source: Ambulatory Visit | Attending: Internal Medicine | Admitting: Internal Medicine

## 2022-10-20 DIAGNOSIS — D869 Sarcoidosis, unspecified: Secondary | ICD-10-CM | POA: Diagnosis not present

## 2022-10-20 DIAGNOSIS — J45909 Unspecified asthma, uncomplicated: Secondary | ICD-10-CM | POA: Diagnosis not present

## 2022-10-29 ENCOUNTER — Ambulatory Visit (HOSPITAL_COMMUNITY)
Admission: RE | Admit: 2022-10-29 | Discharge: 2022-10-29 | Disposition: A | Discharge status: Home / Self Care | Payer: Medicare Other | Source: Ambulatory Visit | Attending: Internal Medicine | Admitting: Internal Medicine

## 2022-10-29 DIAGNOSIS — E119 Type 2 diabetes mellitus without complications: Secondary | ICD-10-CM | POA: Diagnosis not present

## 2022-10-29 DIAGNOSIS — I10 Essential (primary) hypertension: Secondary | ICD-10-CM | POA: Diagnosis not present

## 2022-10-29 DIAGNOSIS — K219 Gastro-esophageal reflux disease without esophagitis: Secondary | ICD-10-CM | POA: Diagnosis not present

## 2022-10-29 DIAGNOSIS — R0609 Other forms of dyspnea: Secondary | ICD-10-CM | POA: Diagnosis not present

## 2022-10-29 DIAGNOSIS — E785 Hyperlipidemia, unspecified: Secondary | ICD-10-CM | POA: Diagnosis not present

## 2022-10-29 DIAGNOSIS — R0600 Dyspnea, unspecified: Secondary | ICD-10-CM | POA: Insufficient documentation

## 2022-10-29 LAB — ECHOCARDIOGRAM COMPLETE
Area-P 1/2: 4.31 cm2
Calc EF: 56.9 %
MV VTI: 3.07 cm2
S' Lateral: 2.8 cm
Single Plane A2C EF: 53.5 %
Single Plane A4C EF: 59.1 %

## 2022-10-29 NOTE — Progress Notes (Signed)
Echocardiogram 2D Echocardiogram has been performed.  [name] 10/29/2022, 3:52 PM

## 2022-11-03 LAB — HM DIABETES EYE EXAM

## 2022-11-04 DIAGNOSIS — E291 Testicular hypofunction: Secondary | ICD-10-CM | POA: Diagnosis not present

## 2022-11-11 ENCOUNTER — Telehealth: Payer: Self-pay

## 2022-11-11 DIAGNOSIS — E291 Testicular hypofunction: Secondary | ICD-10-CM | POA: Diagnosis not present

## 2022-11-11 DIAGNOSIS — N5201 Erectile dysfunction due to arterial insufficiency: Secondary | ICD-10-CM | POA: Diagnosis not present

## 2022-11-11 DIAGNOSIS — E1165 Type 2 diabetes mellitus with hyperglycemia: Secondary | ICD-10-CM

## 2022-11-11 MED ORDER — SYNJARDY XR 25-1000 MG PO TB24
1.0000 | ORAL_TABLET | Freq: Every day | ORAL | 0 refills | Status: DC
Start: 2022-11-11 — End: 2023-01-19

## 2022-11-11 MED ORDER — RYBELSUS 7 MG PO TABS
7.0000 mg | ORAL_TABLET | Freq: Every day | ORAL | 0 refills | Status: DC
Start: 2022-11-11 — End: 2023-01-19

## 2022-11-11 NOTE — Telephone Encounter (Signed)
Pt called to advise he has new insurance and a PA is required for his medication. Pt advised to update insurance with front desk and PA team can submit a PA for medication.

## 2022-11-12 ENCOUNTER — Telehealth: Payer: Self-pay

## 2022-11-12 NOTE — Telephone Encounter (Signed)
Pharmacy Patient Advocate Encounter  Prior Authorization has been APPROVED  Effective through 11/10/2025 

## 2022-11-12 NOTE — Telephone Encounter (Signed)
Patient Advocate Encounter   Received notification from pt msgs that prior authorization is required for Rybelsus 7MG  tablets  Submitted: 11/12/22 Key [id number]  Sent for expedited review Status is pending

## 2022-11-12 NOTE — Telephone Encounter (Signed)
Pharmacy Patient Advocate Encounter  Prior Authorization has been APPROVED  Effective through 11/10/2025

## 2022-11-12 NOTE — Telephone Encounter (Signed)
Patient Advocate Encounter   Received notification from [facility] that prior authorization is required for Synjardy XR 25-1000MG  er tablets  Submitted: 11/12/22 Key [id number]  Sent for expedited review  Status is pending

## 2022-11-27 ENCOUNTER — Ambulatory Visit: Payer: Medicare Other | Admitting: Internal Medicine

## 2022-11-30 ENCOUNTER — Encounter: Payer: Self-pay | Admitting: Primary Care

## 2022-11-30 ENCOUNTER — Ambulatory Visit (INDEPENDENT_AMBULATORY_CARE_PROVIDER_SITE_OTHER): Payer: Medicare Other | Admitting: Internal Medicine

## 2022-11-30 ENCOUNTER — Ambulatory Visit (INDEPENDENT_AMBULATORY_CARE_PROVIDER_SITE_OTHER): Payer: No Typology Code available for payment source | Admitting: Primary Care

## 2022-11-30 ENCOUNTER — Telehealth: Payer: Self-pay | Admitting: Primary Care

## 2022-11-30 VITALS — BP 120/74 | HR 77 | Temp 98.5°F | Ht 75.0 in | Wt 259.2 lb

## 2022-11-30 DIAGNOSIS — J449 Chronic obstructive pulmonary disease, unspecified: Secondary | ICD-10-CM

## 2022-11-30 DIAGNOSIS — R053 Chronic cough: Secondary | ICD-10-CM | POA: Diagnosis not present

## 2022-11-30 DIAGNOSIS — D869 Sarcoidosis, unspecified: Secondary | ICD-10-CM

## 2022-11-30 LAB — PULMONARY FUNCTION TEST
DL/VA % pred: 146 %
DL/VA: 6.38 ml/min/mmHg/L
DLCO cor % pred: 86 %
DLCO cor: 29.5 ml/min/mmHg
DLCO unc % pred: 87 %
DLCO unc: 30.06 ml/min/mmHg
FEF 25-75 Pre: 0.57 L/sec
FEF2575-%Pred-Pre: 14 %
FEV1-%Change-Post: -33 %
FEV1-%Pred-Post: 19 %
FEV1-%Pred-Pre: 28 %
FEV1-Post: 0.88 L
FEV1-Pre: 1.33 L
FEV1FVC-%Change-Post: -8 %
FEV1FVC-%Pred-Pre: 50 %
FEV6-%Change-Post: -28 %
FEV6-%Pred-Post: 39 %
FEV6-%Pred-Pre: 55 %
FEV6-Post: 2.3 L
FEV6-Pre: 3.2 L
FEV6FVC-%Change-Post: 0 %
FEV6FVC-%Pred-Post: 96 %
FEV6FVC-%Pred-Pre: 97 %
FVC-%Change-Post: -27 %
FVC-%Pred-Post: 41 %
FVC-%Pred-Pre: 57 %
FVC-Post: 2.46 L
FVC-Pre: 3.41 L
Post FEV1/FVC ratio: 36 %
Post FEV6/FVC ratio: 94 %
Pre FEV1/FVC ratio: 39 %
Pre FEV6/FVC Ratio: 94 %
RV % pred: 102 %
RV: 2.35 L
TLC % pred: 63 %
TLC: 5.06 L

## 2022-11-30 LAB — POCT EXHALED NITRIC OXIDE: FeNO level (ppb): 27

## 2022-11-30 MED ORDER — TRELEGY ELLIPTA 100-62.5-25 MCG/ACT IN AEPB: 1.0000 | INHALATION_SPRAY | Freq: Every day | RESPIRATORY_TRACT | 5 refills | Status: DC

## 2022-11-30 NOTE — Progress Notes (Signed)
@Patient  ID: [name], male    DOB: 1970/10/07, 52 y.o.   MRN: [id number]  Chief Complaint  Patient presents with   Follow-up    Discuss PFT today.  Doing well.    Referring provider: [name], MD  HPI: 52 year old male, former smoker quit in 2010.  Past medical history significant for pretension, LVH, coronary artery disease, stage IV COPD, hepatic steatosis, 2 diabetes, hyperlipidemia, gout, sarcoidosis.  11/30/2022 Patient presents today to review testing results. Hx sarcoid, patient of Dr. Marchelle Gearing.  Patient was last seen in May.  Previous to this he had not been seen for 2-1/2 years.  He was doing well until early 2024 and started having unintentional weight loss and shortness of breath.  Associated dry cough with wheezing and fatigue.  Unclear nature of symptoms.  Could be sarcoid or something else.  He was ordered for lab work, HRCT, echocardiogram and pulmonary function testing along with  He is doing well today without acute complaints. No change to above respiratory symptoms since consult in May. HRCT chest showed no evidence of sarcoid or interstitial lung disease.  Air-trapping indicative of small airway disease.  Advanced left anterior descending coronary artery calcification.  Echocardiogram completed on 10/29/22 showing normal EF, mild concentric left ventricular hypertrophy.  LV diastolic parameters normal.  Pulmonary function testing today showed severe obstructive lung disease, FEV1 19% predicted after bronchodilator.  Normal diffusion capacity. Lab work was unrevealing.  ACE level 27. Class III allergy to Alternaria Alternata. Eos absolute 200. Mild elevated sed rate.   Pulmonary function testing 11/30/2022 >> FVC 2.46 (41%), FEV1 0.88 (19%), ratio 36. TLC 63%, DLCO 29.50 (86%)   11/30/2022 FENO >> 27   Allergies  Allergen Reactions   Amlodipine Other (See Comments)    Ankle swelling with 10mg , ok with 5mg  dose   Sulfamethoxazole Rash    fever    Sulfonamide Derivatives Rash and Other (See Comments)    Immunization History  Administered Date(s) Administered   COVID-19, mRNA, vaccine(Comirnaty)12 years and older 02/20/2022   Influenza Split 04/06/2011, 02/17/2012, 03/16/2012   Influenza, Quadrivalent, Recombinant, Inj, Pf 02/20/2022   Influenza,inj,Quad PF,6+ Mos 03/21/2013, 03/08/2014, 04/03/2015, 02/19/2016, 02/17/2017, 06/14/2018, 02/20/2019, 03/21/2020   Influenza-Unspecified 03/08/2021   Moderna Sars-Covid-2 Vaccination 07/31/2019, 09/05/2019, 04/19/2020, 02/20/2022   Pneumococcal Conjugate-13 04/29/2015   Pneumococcal Polysaccharide-23 11/06/2015   Tdap 03/21/2013    Past Medical History:  Diagnosis Date   Anginal pain (HCC)    Arthritis    ? 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 Followed by Dr. Nickola Major   Arthritis    "ankles" (04/13/2016)   Asthma    CORONARY ARTERY DISEASE    a. cath 05/2014: mild LAD disease, normal LCx and RCA. Risk factor modification recommended.   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)    MSLT 01/09/09 & MRI brain 01/09/09   Pericarditis    recurrent   POLYNEUROPATHY    PULMONARY SARCOIDOSIS    Mediastinal lymphadenopathy with biospy proven sarcodosis   Seizures (HCC)    "none in 4-5 years; don't know what kind; not related to alcohol" (04/13/2016)   TESTICULAR HYPOFUNCTION    TIA (transient ischemic attack)    "I don't remember when" (04/13/2016)    Tobacco History: Social History   Tobacco Use  Smoking  Status Former   Packs/day: 1.00   Years: 10.00   Additional pack years: 0.00   Total pack years: 10.00   Types: Cigarettes   Quit date: 05/18/2009   Years since quitting: 13.5  Smokeless Tobacco Never   Counseling given: Not Answered   Outpatient Medications Prior to Visit  Medication Sig Dispense Refill   albuterol  (VENTOLIN HFA) 108 (90 Base) MCG/ACT inhaler Inhale 2 puffs into the lungs every 6 (six) hours as needed for wheezing or shortness of breath. 1 each 3   alfuzosin (UROXATRAL) 10 MG 24 hr tablet Take 10 mg by mouth daily.     allopurinol (ZYLOPRIM) 300 MG tablet TAKE 1 TABLET(300 MG) BY MOUTH DAILY 90 tablet 1   aspirin EC 81 MG tablet Take 1 tablet (81 mg total) by mouth daily. 30 tablet 3   cetirizine (ZYRTEC) 10 MG tablet Take 10 mg by mouth daily.     cholecalciferol (VITAMIN D) 1000 units tablet Take 1,000 Units by mouth daily.     clomiPHENE (CLOMID) 50 MG tablet Take 50 mg by mouth every other day.     colchicine 0.6 MG tablet Take 1 tablet (0.6 mg total) by mouth 2 (two) times daily. 180 tablet 0   Continuous Blood Gluc Sensor (FREESTYLE LIBRE 2 SENSOR) MISC 1 Device by Does not apply route every 14 (fourteen) days. 6 each 3   docusate sodium (COLACE) 100 MG capsule Take 100 mg by mouth daily.     Empagliflozin-metFORMIN HCl ER (SYNJARDY XR) 25-1000 MG TB24 Take 1 tablet by mouth daily. 90 tablet 0   folic acid (FOLVITE) 1 MG tablet Take 1 mg by mouth daily.  1   glucose blood (ONETOUCH VERIO) test strip 1 each by Other route daily. And lancets 1/day (Patient taking differently: 1 each by Other route daily.) 100 each 11   metoprolol succinate (TOPROL-XL) 50 MG 24 hr tablet Take 50 mg by mouth daily. Take with or immediately following a meal.     nitroGLYCERIN (NITROSTAT) 0.4 MG SL tablet Place 1 tablet (0.4 mg total) under the tongue every 5 (five) minutes as needed for chest pain. 25 tablet 3   pantoprazole (PROTONIX) 40 MG tablet TAKE 2 TABLETS(80 MG) BY MOUTH DAILY 180 tablet 0   rosuvastatin (CRESTOR) 40 MG tablet Take 1 tablet (40 mg total) by mouth daily. 90 tablet 1   sacubitril-valsartan (ENTRESTO) 49-51 MG Take 1 tablet by mouth 2 (two) times daily. Via NOVARTIS pt assistance     saw palmetto 500 MG capsule Take 500 mg by mouth daily.     Semaglutide (RYBELSUS) 7 MG TABS Take 1  tablet (7 mg total) by mouth daily. 90 tablet 0   tadalafil (CIALIS) 20 MG tablet TAKE ONE TABLET BY MOUTH DAILY AS NEEDED 20 tablet 11   Zinc Sulfate (ZINC 15 PO) Take 1 tablet by mouth daily.     ciprofloxacin (CIPRO) 500 MG tablet Take 1 tablet (500 mg total) by mouth 2 (two) times daily. (Patient not taking: Reported on 11/30/2022) 56 tablet 0   fluticasone (FLONASE) 50 MCG/ACT nasal spray Place 2 sprays into both nostrils daily. (Patient not taking: Reported on 11/30/2022) 16 g 6   No facility-administered medications prior to visit.      Review of Systems  Review of Systems  Constitutional:  Positive for fatigue and unexpected weight change.  HENT: Negative.    Respiratory:  Positive for cough, chest tightness, shortness of breath and wheezing.  Physical Exam  BP 120/74 (BP Location: Right Arm, Patient Position: Sitting, Cuff Size: Large)   Pulse 77   Temp 98.5 F (36.9 C) (Oral)   Ht 6\' 3"  (1.905 m)   Wt 259 lb 3.2 oz (117.6 kg)   SpO2 98%   BMI 32.40 kg/m  Physical Exam Constitutional:      General: He is not in acute distress.    Appearance: Normal appearance. He is not ill-appearing.  HENT:     Mouth/Throat:     Mouth: Mucous membranes are moist.     Pharynx: Oropharynx is clear.  Cardiovascular:     Rate and Rhythm: Normal rate and regular rhythm.  Pulmonary:     Effort: Pulmonary effort is normal.     Breath sounds: Normal breath sounds. No wheezing, rhonchi or rales.  Musculoskeletal:        General: Normal range of motion.  Skin:    General: Skin is warm and dry.  Neurological:     General: No focal deficit present.     Mental Status: He is alert and oriented to person, place, and time. Mental status is at baseline.  Psychiatric:        Mood and Affect: Mood normal.        Behavior: Behavior normal.        Thought Content: Thought content normal.        Judgment: Judgment normal.      Lab Results:  CBC    Component Value Date/Time   WBC 5.5  10/13/2022 1213   RBC 5.67 10/13/2022 1213   HGB 15.3 10/13/2022 1213   HCT 46.4 10/13/2022 1213   PLT 227.0 10/13/2022 1213   MCV 81.8 10/13/2022 1213   MCH 26.4 10/20/2021 0447   MCHC 33.0 10/13/2022 1213   RDW 15.3 10/13/2022 1213   LYMPHSABS 2.3 10/13/2022 1213   MONOABS 0.6 10/13/2022 1213   EOSABS 0.2 10/13/2022 1213   BASOSABS 0.0 10/13/2022 1213    BMET    Component Value Date/Time   NA 141 08/14/2022 0950   NA 141 06/17/2016 0745   K 4.0 08/14/2022 0950   CL 105 08/14/2022 0950   CO2 28 08/14/2022 0950   GLUCOSE 131 (H) 08/14/2022 0950   BUN 13 08/14/2022 0950   BUN 11 06/17/2016 0745   CREATININE 0.77 08/14/2022 0950   CREATININE 0.91 04/07/2016 1529   CALCIUM 9.7 08/14/2022 0950   GFRNONAA >60 10/20/2021 0447   GFRAA >60 02/11/2020 1954    BNP No results found for: "BNP"  ProBNP    Component Value Date/Time   PROBNP 42.1 06/24/2010 1520    Imaging: No results found.   Assessment & Plan:   Stage 4 very severe COPD by GOLD classification [facility]) Former smoker, quit in 2010. Increased dyspnea symptoms along with cough, chest tightness and weight loss over last 4-6 months. HRCT chest negative for sarcoid or ILD, air trapping present and coronary artery calcification. Echo showed normal EF, mildly concentric left ventricular hypertrophy. LV diastolic parameters normal. Pulmonary function testing today showed very severe obstructive lung disease, FEV1 19% predicted postbronchodilator response. Diffusion capacity was normal. FENO 27. Labs were basically unrevealing other than positive allergy testing for Alternaria Alternata and mildly elevated sed rate. Starting patient on Trelegy 100 mcg 1 puff daily. He is up to date with pneumonia vaccine, advised he get annual flu/covid vaccine and check with pharmacy about getting RSV vaccine. Checking Alpha-1 phenotype. FU in 8 weeks or sooner if needed.  CAD (coronary artery disease) - CT chest showed advanced left  anterior descending coronary artery calcification, advies patient follow-up with cardiology regarding findings  40 min spent on case; > 50% face to face with patient  Glenford Bayley, NP 11/30/2022

## 2022-11-30 NOTE — Progress Notes (Signed)
Full PFT performed today. °

## 2022-11-30 NOTE — Patient Instructions (Signed)
Full PFT performed today. °

## 2022-11-30 NOTE — Patient Instructions (Addendum)
- Pulmonary function testing showed stage 4 COPD (severe)  - CT showed no evidence of sarcoid or interstitial lung disease, air trapping which you can see with obstructive lung disease/ COPD. - FENO was not significantly elevated (high level more consistent with asthma) - Start Trelegy daily- take one puff every day in the morning (rinse mouth after use- RX sent) - Come get labs done later this week  - I will discuss allergy testing with Dr. Marchelle Gearing- likely not the sole cause of your shortness of breath but he may want additional testing  - Get annual flu and covid vaccine in the fall, also check with pharmacy about one time RSV vaccine. You appear up to date with pneumonia vaccine  - Follow up with cardiology regarding coronary calcification seen on CT imaging   Recommendations: - Trelegy one puffs daily in the morning  Orders: - Alpha 1 level (genetic marker for COPD)   Follow-up: - 8 weeks with Dr. Marchelle Gearing   Chronic Obstructive Pulmonary Disease  Chronic obstructive pulmonary disease (COPD) is a long-term (chronic) lung problem. When you have COPD, it is hard for air to get in and out of your lungs. Usually the condition gets worse over time, and your lungs will never return to normal. There are things you can do to keep yourself as healthy as possible. What are the causes? Smoking. This is the most common cause. Certain genes passed from parent to child (inherited). What increases the risk? Being exposed to secondhand smoke from cigarettes, pipes, or cigars. Being exposed to chemicals and other irritants, such as fumes and dust in the work environment. Having chronic lung conditions or infections. What are the signs or symptoms? Shortness of breath, especially during physical activity. A long-term cough with a large amount of thick mucus. Sometimes, the cough may not have any mucus (dry cough). Wheezing. Breathing quickly. Skin that looks gray or blue,  especially in the fingers, toes, or lips. Feeling tired (fatigue). Weight loss. Chest tightness. Having infections often. Episodes when breathing symptoms become much worse (exacerbations). At the later stages of this disease, you may have swelling in the ankles, feet, or legs. How is this treated? Taking medicines. Quitting smoking, if you smoke. Rehabilitation. This includes steps to make your body work better. It may involve a team of specialists. Doing exercises. Making changes to your diet. Using oxygen. Lung surgery. Lung transplant. Comfort measures (palliative care). Follow these instructions at home: Medicines Take over-the-counter and prescription medicines only as told by your doctor. Talk to your doctor before taking any cough or allergy medicines. You may need to avoid medicines that cause your lungs to be dry. Lifestyle If you smoke, stop smoking. Smoking makes the problem worse. Do not smoke or use any products that contain nicotine or tobacco. If you need help quitting, ask your doctor. Avoid being around things that make your breathing worse. This may include smoke, chemicals, and fumes. Stay active, but remember to rest as well. Learn and use tips on how to manage stress and control your breathing. Make sure you get enough sleep. Most adults need at least 7 hours of sleep every night. Eat healthy foods. Eat smaller meals more often. Rest before meals. Controlled breathing Learn and use tips on how to control your breathing as told by your doctor. Try: Breathing in (inhaling) through your nose for 1 second. Then, pucker your lips and breath out (exhale) through your lips for 2 seconds. Putting one hand on your  belly (abdomen). Breathe in slowly through your nose for 1 second. Your hand on your belly should move out. Pucker your lips and breathe out slowly through your lips. Your hand on your belly should move in as you breathe out.  Controlled coughing Learn and  use controlled coughing to clear mucus from your lungs. Follow these steps: Lean your head a little forward. Breathe in deeply. Try to hold your breath for 3 seconds. Keep your mouth slightly open while coughing 2 times. Spit any mucus out into a tissue. Rest and do the steps again 1 or 2 times as needed. General instructions Make sure you get all the shots (vaccines) that your doctor recommends. Ask your doctor about a flu shot and a pneumonia shot. Use oxygen therapy and pulmonary rehabilitation if told by your doctor. If you need home oxygen therapy, ask your doctor if you should buy a tool to measure your oxygen level (oximeter). Make a COPD action plan with your doctor. This helps you to know what to do if you feel worse than usual. Manage any other conditions you have as told by your doctor. Avoid going outside when it is very hot, cold, or humid. Avoid people who have a sickness you can catch (contagious). Keep all follow-up visits. Contact a doctor if: You cough up more mucus than usual. There is a change in the color or thickness of the mucus. It is harder to breathe than usual. Your breathing is faster than usual. You have trouble sleeping. You need to use your medicines more often than usual. You have trouble doing your normal activities such as getting dressed or walking around the house. Get help right away if: You have shortness of breath while resting. You have shortness of breath that stops you from: Being able to talk. Doing normal activities. Your chest hurts for longer than 5 minutes. Your skin color is more blue than usual. Your pulse oximeter shows that you have low oxygen for longer than 5 minutes. You have a fever. You feel too tired to breathe normally. These symptoms may represent a serious problem that is an emergency. Do not wait to see if the symptoms will go away. Get medical help right away. Call your local emergency services (911 in the [place] Do not  drive yourself to the hospital. Summary Chronic obstructive pulmonary disease (COPD) is a long-term lung problem. The way your lungs work will never return to normal. Usually the condition gets worse over time. There are things you can do to keep yourself as healthy as possible. Take over-the-counter and prescription medicines only as told by your doctor. If you smoke, stop. Smoking makes the problem worse. This information is not intended to replace advice given to you by your health care provider. Make sure you discuss any questions you have with your health care provider. Document Revised: 04/01/2020 Document Reviewed: 04/02/2020 Elsevier Patient Education  2024 [employer]..

## 2022-11-30 NOTE — Assessment & Plan Note (Addendum)
-   CT chest showed advanced left anterior descending coronary artery calcification, advies patient follow-up with cardiology regarding findings

## 2022-11-30 NOTE — Assessment & Plan Note (Addendum)
Former smoker, quit in 2010. Increased dyspnea symptoms along with cough, chest tightness and weight loss over last 4-6 months. HRCT chest negative for sarcoid or ILD, air trapping present and coronary artery calcification. Echo showed normal EF, mildly concentric left ventricular hypertrophy. LV diastolic parameters normal. Pulmonary function testing today showed very severe obstructive lung disease, FEV1 19% predicted postbronchodilator response. Diffusion capacity was normal. FENO 27. Labs were basically unrevealing other than positive allergy testing for Alternaria Alternata and mildly elevated sed rate. Starting patient on Trelegy 100 mcg 1 puff daily. He is up to date with pneumonia vaccine, advised he get annual flu/covid vaccine and check with pharmacy about getting RSV vaccine. Checking Alpha-1 phenotype. FU in 8 weeks or sooner if needed.

## 2022-11-30 NOTE — Telephone Encounter (Signed)
Saw patient today to review testing. HRCT chest negative for sarcoid or ILD, air trapping present. Conary calcification. Pulmonary function testing today showed very severe obstructive lung disease, FEV1 19% predicted postbronchodilator response. Diffusion capacity was normal. FENO 27. Labs were basically unrevealing other than positive allergy testing for Alternaria Alternata. Starting patient on Trelegy 100 mcg 1 puff daily. He is up to date with pneumonia vaccine. He is a former smoker but quit back in 2010. Getting Alpha-1 phenotype lab.   Any additional testing or recommendations let me know?

## 2022-12-01 ENCOUNTER — Other Ambulatory Visit: Payer: Self-pay

## 2022-12-01 DIAGNOSIS — I10 Essential (primary) hypertension: Secondary | ICD-10-CM | POA: Diagnosis not present

## 2022-12-01 DIAGNOSIS — J449 Chronic obstructive pulmonary disease, unspecified: Secondary | ICD-10-CM | POA: Diagnosis not present

## 2022-12-01 DIAGNOSIS — E782 Mixed hyperlipidemia: Secondary | ICD-10-CM | POA: Diagnosis not present

## 2022-12-01 DIAGNOSIS — E119 Type 2 diabetes mellitus without complications: Secondary | ICD-10-CM | POA: Diagnosis not present

## 2022-12-02 NOTE — Telephone Encounter (Signed)
Called patient.  Gave all information.  Patient verbalized understanding.  Nothing further needed at this time.

## 2022-12-02 NOTE — Addendum Note (Signed)
Addended by: Glenford Bayley on: 12/02/2022 09:56 AM   Modules accepted: Orders

## 2022-12-02 NOTE — Telephone Encounter (Signed)
Will make note of this. Thanks [name] can you let patient know Dr. Marchelle Gearing wanted him referred to allergy, I have placed order

## 2022-12-02 NOTE — Telephone Encounter (Signed)
Thanks  A. Alpha 1 check B. Allergy referral C. Consider Scarlette Ar. At some point [facility] referral

## 2022-12-04 ENCOUNTER — Telehealth: Payer: Self-pay | Admitting: Internal Medicine

## 2022-12-04 ENCOUNTER — Other Ambulatory Visit (HOSPITAL_COMMUNITY): Payer: Self-pay

## 2022-12-04 NOTE — Telephone Encounter (Signed)
PT calling because the Pharm wants [place] to send a PA for his Trelegy. Please call pt to advise action taken. His # is [phone]   [facility] [address]  [phone] This is the number for Cover My Meds

## 2022-12-04 NOTE — Telephone Encounter (Signed)
Patient is showing 2 insurance plans on file. One is not contracted with [facility] so I am not able to do a benefits investigation and the other [facility]) shows the patient is in the coverage gap and the co-pay is $155.95

## 2022-12-08 ENCOUNTER — Telehealth: Payer: Self-pay | Admitting: Primary Care

## 2022-12-08 LAB — ALPHA-1 ANTITRYPSIN PHENOTYPE: A-1 Antitrypsin, Ser: 133 mg/dL (ref 83–199)

## 2022-12-08 NOTE — Telephone Encounter (Signed)
Received fax from [facility]- Alpha 1 antitrypsin phenotype level 133. Awaiting full results.

## 2022-12-09 NOTE — Telephone Encounter (Signed)
Left message for patient to call back  

## 2022-12-16 NOTE — Telephone Encounter (Signed)
Please try again so I can close encounter. TY. 

## 2022-12-23 ENCOUNTER — Other Ambulatory Visit: Payer: Self-pay | Admitting: Internal Medicine

## 2022-12-23 DIAGNOSIS — I2510 Atherosclerotic heart disease of native coronary artery without angina pectoris: Secondary | ICD-10-CM

## 2022-12-23 DIAGNOSIS — E785 Hyperlipidemia, unspecified: Secondary | ICD-10-CM

## 2022-12-28 ENCOUNTER — Telehealth: Payer: Self-pay | Admitting: Internal Medicine

## 2022-12-28 DIAGNOSIS — J45990 Exercise induced bronchospasm: Secondary | ICD-10-CM

## 2022-12-28 MED ORDER — ALBUTEROL SULFATE HFA 108 (90 BASE) MCG/ACT IN AERS: 2.0000 | INHALATION_SPRAY | Freq: Four times a day (QID) | RESPIRATORY_TRACT | 3 refills | Status: DC | PRN

## 2022-12-28 MED ORDER — MOLNUPIRAVIR EUA 200MG CAPSULE: 4.0000 | ORAL_CAPSULE | Freq: Two times a day (BID) | ORAL | 0 refills | Status: AC

## 2022-12-28 NOTE — Telephone Encounter (Addendum)
Called and spoke with the pt- follows for sarcoid/COPD He started having symptoms 7/20- fever, aches, fatigue, dry cough, SOB- took at home covid test and it was positive  He states he has never had covid before and is up to date on all of his covid vaccines  He is asking if we can send antiviral

## 2022-12-28 NOTE — Telephone Encounter (Signed)
Called and spoke with patient. He verbalized understanding. I have refilled his albuterol.   Nothing further needed at time of call.

## 2022-12-28 NOTE — Telephone Encounter (Signed)
He is at a higher risk for hospitalization due to hx COPD. I will send in antiviral called molnupiravir- take 4 capsules twice daily x 5 days   Take Tylenol 1 gram every 6 hours for fever/aches. Push oral fluids. Continue Trelegy one puff daily. Use Albuterol hfa 2 pufs every 6 hours for shortness of breath/wheezing. Check O2 levels and if <88-90% need to go to ED  [facility]

## 2023-01-07 ENCOUNTER — Telehealth: Payer: Self-pay | Admitting: Internal Medicine

## 2023-01-07 NOTE — Telephone Encounter (Signed)
[employer] calling pt. Needs prior auth for Fluticasone-Umeclidin-Vilant (TRELEGY ELLIPTA) 100-62.5-25 MCG/ACT AEPB  please davise

## 2023-01-08 ENCOUNTER — Other Ambulatory Visit: Payer: Self-pay | Admitting: Primary Care

## 2023-01-08 ENCOUNTER — Other Ambulatory Visit (HOSPITAL_COMMUNITY): Payer: Self-pay

## 2023-01-08 ENCOUNTER — Telehealth: Payer: Self-pay

## 2023-01-08 NOTE — Telephone Encounter (Signed)
PA request has been Submitted. New Encounter created for follow up. For additional info see Pharmacy Prior Auth telephone encounter from 08/02.

## 2023-01-08 NOTE — Telephone Encounter (Signed)
*  Pulm  Pharmacy Patient Advocate Encounter   Received notification from Pt Calls Messages that prior authorization for Trelegy Ellipta 100-62.5-25MCG/ACT aerosol powder  is required/requested.   Insurance verification completed.   The patient is insured through [facility] .   Per test claim: PA required; PA submitted to [facility] via CoverMyMeds Key/confirmation #/EOC [id number] Status is pending

## 2023-01-11 ENCOUNTER — Other Ambulatory Visit (HOSPITAL_COMMUNITY): Payer: Self-pay

## 2023-01-11 NOTE — Telephone Encounter (Signed)
Pharmacy Patient Advocate Encounter  Received notification from [facility] that Prior Authorization for Trelegy Ellipta 100-62.5-25MCG/ACT aerosol powder  has been DENIED. Please advise how you'd like to proceed. Full denial letter will be uploaded to the media tab. See denial reason below.  PA #/Case ID/Reference #: [id number]    Your plan only covers this drug when you meet one of these options: A) You have tried other products your plan covers (preferred products), and they did not work well for you, or B) Your doctor gives [place] a medical reason you cannot take those other products. For your plan, you may need to try up to three preferred products. We have denied your request because you do not meet any of these conditions. We reviewed the information we had. Your request has been denied. Your doctor can send [place] any new or missing information for [place] to review. The preferred products for your plan are: Spiriva Respimat, Wixela Inhub, fluticasone propionate-salmeterol diskus (except excluded NDCs), Symbicort. Your doctor may need to get approval from your plan for preferred products. For this drug, you may have to meet other criteria. You can request the drug policy for more details. You can also request other plan documents for your review.

## 2023-01-12 ENCOUNTER — Other Ambulatory Visit (HOSPITAL_COMMUNITY): Payer: Self-pay

## 2023-01-12 ENCOUNTER — Other Ambulatory Visit: Payer: Self-pay

## 2023-01-12 MED ORDER — TRELEGY ELLIPTA 100-62.5-25 MCG/ACT IN AEPB: 1.0000 | INHALATION_SPRAY | Freq: Every day | RESPIRATORY_TRACT | 5 refills | Status: DC

## 2023-01-12 NOTE — Telephone Encounter (Signed)
He has tried and failed, Symbicort, pulmicort and qvar  What is covered on his plan for triple therapy?

## 2023-01-12 NOTE — Telephone Encounter (Signed)
Patients insurance is not contracted with [employer], unable to do a benefits investigation.

## 2023-01-19 ENCOUNTER — Ambulatory Visit: Payer: Medicare Other | Admitting: Internal Medicine

## 2023-01-19 ENCOUNTER — Encounter: Payer: Self-pay | Admitting: Internal Medicine

## 2023-01-19 VITALS — BP 130/80 | HR 100 | Ht 75.0 in | Wt 259.8 lb

## 2023-01-19 DIAGNOSIS — E1165 Type 2 diabetes mellitus with hyperglycemia: Secondary | ICD-10-CM

## 2023-01-19 DIAGNOSIS — E785 Hyperlipidemia, unspecified: Secondary | ICD-10-CM | POA: Diagnosis not present

## 2023-01-19 DIAGNOSIS — E1159 Type 2 diabetes mellitus with other circulatory complications: Secondary | ICD-10-CM | POA: Diagnosis not present

## 2023-01-19 DIAGNOSIS — Z7984 Long term (current) use of oral hypoglycemic drugs: Secondary | ICD-10-CM

## 2023-01-19 DIAGNOSIS — E119 Type 2 diabetes mellitus without complications: Secondary | ICD-10-CM | POA: Diagnosis not present

## 2023-01-19 LAB — POCT GLYCOSYLATED HEMOGLOBIN (HGB A1C): Hemoglobin A1C: 6.7 % — AB (ref 4.0–5.6)

## 2023-01-19 MED ORDER — FREESTYLE LIBRE 3 SENSOR MISC: 1.0000 | 3 refills | Status: DC

## 2023-01-19 MED ORDER — RYBELSUS 7 MG PO TABS
7.0000 mg | ORAL_TABLET | Freq: Every day | ORAL | 3 refills | Status: DC
Start: 2023-01-19 — End: 2024-01-25

## 2023-01-19 MED ORDER — TIOTROPIUM BROMIDE MONOHYDRATE 18 MCG IN CAPS: 18.0000 ug | ORAL_CAPSULE | Freq: Every day | RESPIRATORY_TRACT | 6 refills | Status: DC

## 2023-01-19 MED ORDER — SYNJARDY XR 25-1000 MG PO TB24
1.0000 | ORAL_TABLET | Freq: Every day | ORAL | 3 refills | Status: DC
Start: 2023-01-19 — End: 2024-01-25

## 2023-01-19 MED ORDER — FLUTICASONE-SALMETEROL 250-50 MCG/ACT IN AEPB: 1.0000 | INHALATION_SPRAY | Freq: Two times a day (BID) | RESPIRATORY_TRACT | 5 refills | Status: DC

## 2023-01-19 NOTE — Telephone Encounter (Signed)
Please let patient I will send in Advair + Spiriva in place of Trelegy which was denied by his insurance.

## 2023-01-19 NOTE — Addendum Note (Signed)
Addended by: Glenford Bayley on: 01/19/2023 02:28 PM   Modules accepted: Orders

## 2023-01-19 NOTE — Progress Notes (Signed)
Patient ID: [name], male   DOB: 1971/02/24, 52 y.o.   MRN: [id number]  HPI: [name] is a 52 y.o.-year-old male, returning for f/u for DM2, dx in 2008, non-insulin-dependent, uncontrolled, with complications (CAD, h/o TIA, PN, ED). He prev. Saw Dr. Everardo All.  Last visit with me 6.5 months ago.  Interim history: No increased urination, blurry vision, nausea, chest pain. Patient lost 40 pounds before last visit!  He gained 3 pounds since then. He had COVID 12/26/2022 - resolved.  He was recently diagnosed with stage IV COPD.  He is not smoking now but did smoke and stopped in 2010.  Reviewed HbA1c: Lab Results  Component Value Date   HGBA1C 6.4 (A) 07/07/2022   HGBA1C 6.7 (A) 03/02/2022   HGBA1C 7.2 (A) 10/21/2021   HGBA1C 7.4 (A) 07/22/2021   HGBA1C 7.6 (A) 04/22/2021   HGBA1C 7.4 (A) 02/18/2021   HGBA1C 7.9 (H) 12/05/2020   HGBA1C 7.4 (A) 09/30/2020   HGBA1C 7.5 (A) 01/10/2020   HGBA1C 6.8 (H) 08/29/2019  In Dr. Annitta Jersey office: 12/2021: HbA1c <7% reportedly  He is on: - Metformin ER 1000 mg 2x a day >> Synjardy (Empagliflozin-Metformin) ER 25-1000 mg in am - Rybelsus (started 07/2021): 7 mg daily in am He had weight gain with Actos. He had nausea with Repaglinide. Stopped Nateglinide per cardiology.  He has a h/o gall bladder- related pancreatitis 2019.  Pt checks his sugars >4x a day with his freestyle libre 3 CGM:  Previously:   Previously:   Lowest sugar was 59 (night) >> 63 >> 54 >> 67; ? hypoglycemia awareness.  Highest sugar was 246 (cookout) >> 290 >> 200 >> 190.  Glucometer: One Touch Verio  - no CKD, last BUN/creatinine:  Lab Results  Component Value Date   BUN 13 08/14/2022   BUN 12 04/16/2022   CREATININE 0.77 08/14/2022   CREATININE 0.82 04/16/2022  On Entresto.  - + HL; last set of lipids: Lab Results  Component Value Date   CHOL 112 04/16/2022   HDL 41.30 04/16/2022   LDLCALC 60 04/16/2022   TRIG 53.0 04/16/2022   CHOLHDL 3  04/16/2022  On Crestor 40 mg daily.  - last eye exam was in 11/03/2022. No DR.   - no numbness and tingling in his feet. Last foot exam: 03/02/2022.  He has a h/o: HTN, gout, pulm. Sarcoidosis, COPD stage IV, seizures, GERD. Also, recurring pericarditis. At our visit from 06/2022, he had prostatitis with increased urination/nocturia and lower abdominal pressure - Urologist: Dr. Benancio Deeds.  ROS: + see HPI  Past Medical History:  Diagnosis Date   Anginal pain (HCC)    Arthritis    ? 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 Followed by Dr. Nickola Major   Arthritis    "ankles" (04/13/2016)   Asthma    CORONARY ARTERY DISEASE    a. cath 05/2014: mild LAD disease, normal LCx and RCA. Risk factor modification recommended.   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)    MSLT 01/09/09 & MRI brain 01/09/09   Pericarditis    recurrent   POLYNEUROPATHY    PULMONARY SARCOIDOSIS    Mediastinal lymphadenopathy with biospy proven sarcodosis   Seizures (HCC)    "none in 4-5 years; don't know what kind; not related to alcohol" (04/13/2016)  TESTICULAR HYPOFUNCTION    TIA (transient ischemic attack)    "I don't remember when" (04/13/2016)   Past Surgical History:  Procedure Laterality Date   BRONCHOSCOPY  08/21/08   CARDIAC CATHETERIZATION  06/25/2009   minimal disease   CARDIAC CATHETERIZATION N/A 04/14/2016   Procedure: Left Heart Cath and Coronary Angiography;  Surgeon: Kathleene Hazel, MD;  Location: [facility] INVASIVE CV LAB;  Service: Cardiovascular;  Laterality: N/A;   CHOLECYSTECTOMY N/A 09/21/2017   Procedure: LAPAROSCOPIC CHOLECYSTECTOMY WITH INTRAOPERATIVE CHOLANGIOGRAM;  Surgeon: Glenna Fellows, MD;  Location: [facility] ORS;  Service: General;  Laterality: N/A;   LEFT HEART CATHETERIZATION WITH CORONARY ANGIOGRAM N/A 05/18/2014   Procedure:  LEFT HEART CATHETERIZATION WITH CORONARY ANGIOGRAM;  Surgeon: Corky Crafts, MD;  Location: [facility] CATH LAB;  Service: Cardiovascular;  Laterality: N/A;   MEDIASTINOSCOPY  11/30/08   Social History   Socioeconomic History   Marital status: Significant Other    Spouse name: Not on file   Number of children: 3   Years of education: Not on file   Highest education level: Not on file  Occupational History   Occupation: Unemployed  Tobacco Use   Smoking status: Former    Current packs/day: 0.00    Average packs/day: 1 pack/day for 10.0 years (10.0 ttl pk-yrs)    Types: Cigarettes    Start date: 05/19/1999    Quit date: 05/18/2009    Years since quitting: 13.6   Smokeless tobacco: Never  Vaping Use   Vaping status: Never Used  Substance and Sexual Activity   Alcohol use: Yes    Comment: rare   Drug use: No   Sexual activity: Yes  Other Topics Concern   Not on file  Social History Narrative   Married, lives with wife and 3 kids.    Currently student. 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 [employer]   Social Determinants of [employer]: Low Risk  (06/23/2022)   Overall Financial Resource Strain (CARDIA)    Difficulty of Paying Living Expenses: Not hard at all  Food Insecurity: No Food Insecurity (06/23/2022)   Hunger Vital Sign    Worried About Running Out of Food in the Last Year: Never true    Ran Out of Food in the Last Year: Never true  Transportation Needs: No Transportation Needs (06/23/2022)   PRAPARE - Administrator, Civil Service (Medical): No    Lack of Transportation (Non-Medical): No  Physical Activity: Sufficiently Active (06/23/2022)   Exercise Vital Sign    Days of Exercise per Week: 7 days    Minutes of Exercise per Session: 30 min  Stress: Stress Concern Present (06/23/2022)   [employer] of Occupational Health - Occupational Stress Questionnaire    Feeling of Stress : To some extent  Social Connections: Socially  Integrated (06/23/2022)   Social Connection and Isolation Panel [NHANES]    Frequency of Communication with Friends and Family: More than three times a week    Frequency of Social Gatherings with Friends and Family: More than three times a week    Attends Religious Services: More than 4 times per year    Active Member of [employer] or Organizations: No    Attends Engineer, structural: More than 4 times per year    Marital Status: Living with partner  Intimate Partner Violence: Not At Risk (06/23/2022)   Humiliation, Afraid, Rape, and Kick questionnaire    Fear of Current or Ex-Partner: No  Emotionally Abused: No    Physically Abused: No    Sexually Abused: No   Current Outpatient Medications on File Prior to Visit  Medication Sig Dispense Refill   albuterol (VENTOLIN HFA) 108 (90 Base) MCG/ACT inhaler Inhale 2 puffs into the lungs every 6 (six) hours as needed for wheezing or shortness of breath. 1 each 3   alfuzosin (UROXATRAL) 10 MG 24 hr tablet Take 10 mg by mouth daily.     allopurinol (ZYLOPRIM) 300 MG tablet TAKE 1 TABLET(300 MG) BY MOUTH DAILY 90 tablet 1   aspirin EC 81 MG tablet Take 1 tablet (81 mg total) by mouth daily. 30 tablet 3   cetirizine (ZYRTEC) 10 MG tablet Take 10 mg by mouth daily.     cholecalciferol (VITAMIN D) 1000 units tablet Take 1,000 Units by mouth daily.     ciprofloxacin (CIPRO) 500 MG tablet Take 1 tablet (500 mg total) by mouth 2 (two) times daily. (Patient not taking: Reported on 11/30/2022) 56 tablet 0   clomiPHENE (CLOMID) 50 MG tablet Take 50 mg by mouth every other day.     colchicine 0.6 MG tablet Take 1 tablet (0.6 mg total) by mouth 2 (two) times daily. 180 tablet 0   Continuous Blood Gluc Sensor (FREESTYLE LIBRE 2 SENSOR) MISC 1 Device by Does not apply route every 14 (fourteen) days. 6 each 3   docusate sodium (COLACE) 100 MG capsule Take 100 mg by mouth daily.     Empagliflozin-metFORMIN HCl ER (SYNJARDY XR) 25-1000 MG TB24 Take 1 tablet by  mouth daily. 90 tablet 0   fluticasone (FLONASE) 50 MCG/ACT nasal spray Place 2 sprays into both nostrils daily. (Patient not taking: Reported on 11/30/2022) 16 g 6   Fluticasone-Umeclidin-Vilant (TRELEGY ELLIPTA) 100-62.5-25 MCG/ACT AEPB Inhale 1 puff into the lungs daily at 6 (six) AM. TAKE 1 PUFF BY MOUTH EVERY DAY 60 each 5   folic acid (FOLVITE) 1 MG tablet Take 1 mg by mouth daily.  1   glucose blood (ONETOUCH VERIO) test strip 1 each by Other route daily. And lancets 1/day (Patient taking differently: 1 each by Other route daily.) 100 each 11   metoprolol succinate (TOPROL-XL) 50 MG 24 hr tablet Take 50 mg by mouth daily. Take with or immediately following a meal.     nitroGLYCERIN (NITROSTAT) 0.4 MG SL tablet Place 1 tablet (0.4 mg total) under the tongue every 5 (five) minutes as needed for chest pain. 25 tablet 3   pantoprazole (PROTONIX) 40 MG tablet TAKE 2 TABLETS(80 MG) BY MOUTH DAILY 180 tablet 0   rosuvastatin (CRESTOR) 40 MG tablet TAKE 1 TABLET(40 MG) BY MOUTH DAILY 90 tablet 0   sacubitril-valsartan (ENTRESTO) 49-51 MG Take 1 tablet by mouth 2 (two) times daily. Via NOVARTIS pt assistance     saw palmetto 500 MG capsule Take 500 mg by mouth daily.     Semaglutide (RYBELSUS) 7 MG TABS Take 1 tablet (7 mg total) by mouth daily. 90 tablet 0   tadalafil (CIALIS) 20 MG tablet TAKE ONE TABLET BY MOUTH DAILY AS NEEDED 20 tablet 11   Zinc Sulfate (ZINC 15 PO) Take 1 tablet by mouth daily.     No current facility-administered medications on file prior to visit.   Allergies  Allergen Reactions   Amlodipine Other (See Comments)    Ankle swelling with 10mg , ok with 5mg  dose   Sulfamethoxazole Rash    fever   Sulfonamide Derivatives Rash and Other (See Comments)   Family  History  Problem Relation Age of Onset   Diabetes Mother    Hypertension Mother    Colon polyps Mother    Diabetes Father    Heart disease Father 32       Fatal MI   Heart failure Father    Colon polyps Father     Pancreatic cancer Maternal Grandmother    Stomach cancer Maternal Uncle    Pancreatic cancer Maternal Uncle    Colon cancer Neg Hx    Esophageal cancer Neg Hx    Rectal cancer Neg Hx    Liver disease Neg Hx    PE: BP 130/80   Pulse 100   Ht 6\' 3"  (1.905 m)   Wt 259 lb 12.8 oz (117.8 kg)   SpO2 98%   BMI 32.47 kg/m  Wt Readings from Last 3 Encounters:  01/19/23 259 lb 12.8 oz (117.8 kg)  11/30/22 259 lb 3.2 oz (117.6 kg)  10/13/22 249 lb 9.6 oz (113.2 kg)   Constitutional: overweight, in NAD Eyes: EOMI, no exophthalmos ENT: no thyromegaly, no cervical lymphadenopathy Cardiovascular: No tachycardia at the time of the exam, RR, No MRG Respiratory: CTA B Musculoskeletal: no deformities Skin: no rashes Neurological: no tremor with outstretched hands Diabetic Foot Exam - Simple   Simple Foot Form Diabetic Foot exam was performed with the following findings: Yes 01/19/2023 10:01 AM  Visual Inspection No deformities, no ulcerations, no other skin breakdown bilaterally: Yes Sensation Testing Intact to touch and monofilament testing bilaterally: Yes Pulse Check Posterior Tibialis and Dorsalis pulse intact bilaterally: Yes Comments    ASSESSMENT: 1. DM2, non-insulin-dependent, uncontrolled, with complications - CAD - h/o TIA - PN - ED  2. HL  PLAN:  1. Patient with longstanding, previously uncontrolled type 2 diabetes, on oral antidiabetic regimen with GLP-1 receptor agonist in combination of metformin and SGLT2 inhibitor, with improved control and an HbA1c of 6.4% at last visit.  Sugars were fluctuating within the target range with only rare, mild, hyperglycemic peaks occasionally after lunch and dinner.  Overall, his diabetes appeared to have improved and we did not change his regimen.  Since last visit, his Synjardy and Rybelsus were both approved with PAs. CGM interpretation: -At today's visit, we reviewed his CGM downloads: It appears that 98% of values are in target  range (goal >70%), while 2% are higher than 180 (goal <25%), and 0% are lower than 70 (goal <4%).  The calculated average blood sugar is 121.  The projected HbA1c for the next 3 months (GMI) is 6.2%. -Reviewing the CGM trends, sugars appear to be well-controlled, fluctuating within the target range with very rare occasional mildly hyperglycemic spikes especially after breakfast.  No need to change his regimen for now.  I refilled his prescriptions. - I suggested to:  Patient Instructions  Please continue: - Rybelsus 7 mg before b'fast - Synjardy 1000-25 mg in am  Please return in 4-6 months.  - we checked his HbA1c: 6.7% (slightly higher, but still at goal) - advised to check sugars at different times of the day - 4x a day, rotating check times - advised for yearly eye exams >> he is UTD - return to clinic in 4-6 months  2.  HL -Reviewed the latest lipid panel from 04/2022: Fractions at goal at LDL slightly higher than our goal of less than 55 due to history of cardiovascular disease Lab Results  Component Value Date   CHOL 112 04/16/2022   HDL 41.30 04/16/2022   LDLCALC 60  04/16/2022   TRIG 53.0 04/16/2022   CHOLHDL 3 04/16/2022  -He continues on Crestor 40 mg daily without side effects  Carlus Pavlov, MD PhD [facility] Endocrinology

## 2023-01-19 NOTE — Patient Instructions (Addendum)
Please continue: - Rybelsus 7 mg before b'fast - Synjardy 1000-25 mg before b'fast  Please return in 4-6 months.

## 2023-01-19 NOTE — Addendum Note (Signed)
Addended by: Pollie Meyer on: 01/19/2023 10:19 AM   Modules accepted: Orders

## 2023-01-20 NOTE — Telephone Encounter (Signed)
Called patient.  Gave information regarding inhalers.  Patient verbalized understanding.

## 2023-02-01 ENCOUNTER — Encounter: Payer: Self-pay | Admitting: Family Medicine

## 2023-02-01 ENCOUNTER — Ambulatory Visit: Payer: Medicare Other | Admitting: Family Medicine

## 2023-02-01 VITALS — BP 120/76 | HR 82 | Temp 98.1°F | Resp 20 | Ht 75.0 in | Wt 252.0 lb

## 2023-02-01 DIAGNOSIS — N410 Acute prostatitis: Secondary | ICD-10-CM | POA: Diagnosis not present

## 2023-02-01 DIAGNOSIS — R350 Frequency of micturition: Secondary | ICD-10-CM

## 2023-02-01 LAB — POCT URINALYSIS DIPSTICK
Bilirubin, UA: NEGATIVE
Glucose, UA: POSITIVE — AB
Ketones, UA: 5
Nitrite, UA: NEGATIVE
Protein, UA: POSITIVE — AB
Spec Grav, UA: 1.01 (ref 1.010–1.025)
Urobilinogen, UA: NEGATIVE E.U./dL — AB
pH, UA: 6 (ref 5.0–8.0)

## 2023-02-01 MED ORDER — CIPROFLOXACIN HCL 500 MG PO TABS: 500.0000 mg | ORAL_TABLET | Freq: Two times a day (BID) | ORAL | 0 refills | Status: DC

## 2023-02-01 NOTE — Progress Notes (Signed)
Assessment & Plan:  1. Acute prostatitis Education provided on prostatitis. - ciprofloxacin (CIPRO) 500 MG tablet; Take 1 tablet (500 mg total) by mouth 2 (two) times daily.  Dispense: 84 tablet; Refill: 0  2. Urine frequency Results for orders placed or performed in visit on 02/01/23  POCT urinalysis dipstick  Result Value Ref Range   Color, UA pale yellow    Clarity, UA clear    Glucose, UA Positive (A) Negative   Bilirubin, UA neg    Ketones, UA 5    Spec Grav, UA 1.010 1.010 - 1.025   Blood, UA 5-10    pH, UA 6.0 5.0 - 8.0   Protein, UA Positive (A) Negative   Urobilinogen, UA negative (A) 0.2 or 1.0 E.U./dL   Nitrite, UA neg    Leukocytes, UA Trace (A) Negative   Appearance     Odor     - POCT urinalysis dipstick - Urine Culture; Future   Follow up plan: Return if symptoms worsen or fail to improve.  Deliah Boston, MSN, APRN, FNP-C  Subjective:  HPI: [name] is a 52 y.o. male presenting on 02/01/2023 for Back Pain (With urine frequency /This started on Thursday (today is day 4))  Patient reports back pain and urinary frequency that started four days ago. Dysuria Back pain worse than before History of prostatis No difference day or night Nocturia x2/night; doubled currently No fever or chills + urologist   ROS: Negative unless specifically indicated above in HPI.   Relevant past medical history reviewed and updated as indicated.   Allergies and medications reviewed and updated.   Current Outpatient Medications:    albuterol (VENTOLIN HFA) 108 (90 Base) MCG/ACT inhaler, Inhale 2 puffs into the lungs every 6 (six) hours as needed for wheezing or shortness of breath., Disp: 1 each, Rfl: 3   allopurinol (ZYLOPRIM) 300 MG tablet, TAKE 1 TABLET(300 MG) BY MOUTH DAILY, Disp: 90 tablet, Rfl: 1   aspirin EC 81 MG tablet, Take 1 tablet (81 mg total) by mouth daily., Disp: 30 tablet, Rfl: 3   cetirizine (ZYRTEC) 10 MG tablet, Take 10 mg by mouth daily.,  Disp: , Rfl:    cholecalciferol (VITAMIN D) 1000 units tablet, Take 1,000 Units by mouth daily., Disp: , Rfl:    clomiPHENE (CLOMID) 50 MG tablet, Take 50 mg by mouth every other day., Disp: , Rfl:    Continuous Glucose Sensor (FREESTYLE LIBRE 3 SENSOR) MISC, 1 each by Does not apply route every 14 (fourteen) days., Disp: 6 each, Rfl: 3   docusate sodium (COLACE) 100 MG capsule, Take 100 mg by mouth daily., Disp: , Rfl:    Empagliflozin-metFORMIN HCl ER (SYNJARDY XR) 25-1000 MG TB24, Take 1 tablet by mouth daily., Disp: 90 tablet, Rfl: 3   fluticasone (FLONASE) 50 MCG/ACT nasal spray, Place 2 sprays into both nostrils daily., Disp: 16 g, Rfl: 6   fluticasone-salmeterol (ADVAIR) 250-50 MCG/ACT AEPB, Inhale 1 puff into the lungs in the morning and at bedtime., Disp: 60 each, Rfl: 5   folic acid (FOLVITE) 1 MG tablet, Take 1 mg by mouth daily., Disp: , Rfl: 1   glucose blood (ONETOUCH VERIO) test strip, 1 each by Other route daily. And lancets 1/day (Patient taking differently: 1 each by Other route daily.), Disp: 100 each, Rfl: 11   metoprolol succinate (TOPROL-XL) 50 MG 24 hr tablet, Take 50 mg by mouth daily. Take with or immediately following a meal., Disp: , Rfl:    pantoprazole (  PROTONIX) 40 MG tablet, TAKE 2 TABLETS(80 MG) BY MOUTH DAILY, Disp: 180 tablet, Rfl: 0   rosuvastatin (CRESTOR) 40 MG tablet, TAKE 1 TABLET(40 MG) BY MOUTH DAILY, Disp: 90 tablet, Rfl: 0   sacubitril-valsartan (ENTRESTO) 49-51 MG, Take 1 tablet by mouth 2 (two) times daily. Via NOVARTIS pt assistance, Disp: , Rfl:    saw palmetto 500 MG capsule, Take 500 mg by mouth daily., Disp: , Rfl:    Semaglutide (RYBELSUS) 7 MG TABS, Take 1 tablet (7 mg total) by mouth daily., Disp: 90 tablet, Rfl: 3   tadalafil (CIALIS) 20 MG tablet, TAKE ONE TABLET BY MOUTH DAILY AS NEEDED, Disp: 20 tablet, Rfl: 11   tiotropium (SPIRIVA) 18 MCG inhalation capsule, Place 1 capsule (18 mcg total) into inhaler and inhale daily., Disp: 30 capsule,  Rfl: 6   Zinc Sulfate (ZINC 15 PO), Take 1 tablet by mouth daily., Disp: , Rfl:    colchicine 0.6 MG tablet, Take 1 tablet (0.6 mg total) by mouth 2 (two) times daily. (Patient not taking: Reported on 02/01/2023), Disp: 180 tablet, Rfl: 0   nitroGLYCERIN (NITROSTAT) 0.4 MG SL tablet, Place 1 tablet (0.4 mg total) under the tongue every 5 (five) minutes as needed for chest pain. (Patient not taking: Reported on 02/01/2023), Disp: 25 tablet, Rfl: 3  Allergies  Allergen Reactions   Amlodipine Other (See Comments)    Ankle swelling with 10mg , ok with 5mg  dose   Sulfamethoxazole Rash    fever   Sulfonamide Derivatives Rash and Other (See Comments)    Objective:   BP 120/76   Pulse 82   Temp 98.1 F (36.7 C)   Resp 20   Ht 6\' 3"  (1.905 m)   Wt 252 lb (114.3 kg)   BMI 31.50 kg/m    Physical Exam Vitals reviewed. Exam conducted with a chaperone present (J. Bullins).  Constitutional:      General: He is not in acute distress.    Appearance: Normal appearance. He is not ill-appearing, toxic-appearing or diaphoretic.  HENT:     Head: Normocephalic and atraumatic.  Eyes:     General: No scleral icterus.       Right eye: No discharge.        Left eye: No discharge.     Conjunctiva/sclera: Conjunctivae normal.  Cardiovascular:     Rate and Rhythm: Normal rate.  Pulmonary:     Effort: Pulmonary effort is normal. No respiratory distress.  Genitourinary:    Prostate: Enlarged and tender (slightly).     Rectum: Normal.  Musculoskeletal:        General: Normal range of motion.     Cervical back: Normal range of motion.  Skin:    General: Skin is warm and dry.  Neurological:     Mental Status: He is alert and oriented to person, place, and time. Mental status is at baseline.  Psychiatric:        Mood and Affect: Mood normal.        Behavior: Behavior normal.        Thought Content: Thought content normal.        Judgment: Judgment normal.

## 2023-02-02 ENCOUNTER — Encounter: Payer: Self-pay | Admitting: Allergy and Immunology

## 2023-02-02 ENCOUNTER — Other Ambulatory Visit: Payer: Self-pay

## 2023-02-02 ENCOUNTER — Ambulatory Visit (INDEPENDENT_AMBULATORY_CARE_PROVIDER_SITE_OTHER): Payer: Medicare Other | Admitting: Allergy and Immunology

## 2023-02-02 VITALS — BP 110/80 | HR 78 | Temp 98.3°F | Ht 74.0 in | Wt 252.1 lb

## 2023-02-02 DIAGNOSIS — J301 Allergic rhinitis due to pollen: Secondary | ICD-10-CM

## 2023-02-02 DIAGNOSIS — Z77098 Contact with and (suspected) exposure to other hazardous, chiefly nonmedicinal, chemicals: Secondary | ICD-10-CM

## 2023-02-02 DIAGNOSIS — J4550 Severe persistent asthma, uncomplicated: Secondary | ICD-10-CM

## 2023-02-02 DIAGNOSIS — J3089 Other allergic rhinitis: Secondary | ICD-10-CM | POA: Diagnosis not present

## 2023-02-02 LAB — URINE CULTURE: Result:: NO GROWTH

## 2023-02-02 MED ORDER — AIRSUPRA 90-80 MCG/ACT IN AERO: 2.0000 | INHALATION_SPRAY | RESPIRATORY_TRACT | 1 refills | Status: DC | PRN

## 2023-02-02 NOTE — Progress Notes (Unsigned)
[place] - [place] - [place] - [place]   Dear [name],  Thank you for referring [name] to the [facility] [place] on 02/02/2023.   Below is a summation of this patient's evaluation and recommendations.  Thank you for your referral. I will keep you informed about this patient's response to treatment.   If you have any questions please do not hesitate to contact me.   Sincerely,  Xavier Priest, MD Allergy / Immunology [place]   ______________________________________________________________________    NEW PATIENT NOTE  Referring Provider: Glenford Bayley, NP Primary Provider: [name], MD Date of office visit: 02/02/2023    Subjective:   Chief Complaint:  [name] (DOB: Jul 25, 1970) is a 52 y.o. male who presents to the clinic on 02/02/2023 with a chief complaint of Establish Care and Cough (PCP Dx COPD) .     HPI: [name] presents to this clinic in evaluation of respiratory tract problems.  [name] has a history that dates back to around 2010 at which point in time he was diagnosed with sarcoid and was treated with prolonged systemic steroid use and methotrexate which he used up until 2017.  His symptoms at that point in time or shortness of breath and cough which are the the symptoms that he possesses today.  Interestingly, he can exercise without much problem and he does not have any nocturnal bronchospastic symptoms.  He is currently using trilogy for the past 2 months which he thinks has helped him regarding his shortness of breath and cough.  He has a short acting bronchodilator which does not help him at all.  He does not really have exposure to particles but he does have exposure to fumes.  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  It is only over the course of the past 2 years or so that he  started using a respirator.  As well, he smoked for 17 years at about a pack and 1/2/week which she discontinued in 2010.  He does have a history of nasal congestion and sneezing and some itchy eyes that occurs on a perennial basis and may be worse when he has pollen exposure for which she will use Flonase rarely but uses Zyrtec on a pretty consistent basis.  He has reflux with regurgitation that he thinks is under 100% control while using pantoprazole.  He drinks 3 caffeinated drinks per week, does not really eat chocolate, and has alcohol about twice a month.  Past Medical History:  Diagnosis Date  . Anginal pain (HCC)   . Arthritis    ? 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 Followed by Dr. Nickola Major  . Arthritis    "ankles" (04/13/2016)  . Asthma   . CORONARY ARTERY DISEASE    a. cath 05/2014: mild LAD disease, normal LCx and RCA. Risk factor modification recommended.  Marland Kitchen DIABETES MELLITUS, TYPE II   . Edema   . ERECTILE DYSFUNCTION, ORGANIC   . GERD   . Gout, unspecified   . Heart murmur   . HYPERLIPIDEMIA   . HYPERTENSION   . 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 Pericarditis    recurrent  . POLYNEUROPATHY   . PULMONARY SARCOIDOSIS    Mediastinal lymphadenopathy with biospy proven sarcodosis  . Seizures (HCC)    "none in 4-5 years;  don't know what kind; not related to alcohol" (04/13/2016)  . TESTICULAR HYPOFUNCTION   . TIA (transient ischemic attack)    "I don't remember when" (04/13/2016)    Past Surgical History:  Procedure Laterality Date  . BRONCHOSCOPY  08/21/08  . CARDIAC CATHETERIZATION  06/25/2009   minimal disease  . CARDIAC CATHETERIZATION N/A 04/14/2016   Procedure: Left Heart Cath and Coronary Angiography;  Surgeon: Kathleene Hazel, MD;  Location: [facility] INVASIVE CV LAB;  Service: Cardiovascular;  Laterality: N/A;  . CHOLECYSTECTOMY N/A 09/21/2017   Procedure: LAPAROSCOPIC CHOLECYSTECTOMY WITH INTRAOPERATIVE  CHOLANGIOGRAM;  Surgeon: Glenna Fellows, MD;  Location: [facility] ORS;  Service: General;  Laterality: N/A;  . LEFT HEART CATHETERIZATION WITH CORONARY ANGIOGRAM N/A 05/18/2014   Procedure: LEFT HEART CATHETERIZATION WITH CORONARY ANGIOGRAM;  Surgeon: Corky Crafts, MD;  Location[facility] CATH LAB;  Service: Cardiovascular;  Laterality: N/A;  . MEDIASTINOSCOPY  11/30/08    Allergies as of 02/02/2023       Reactions   Amlodipine Other (See Comments)   Ankle swelling with 10mg , ok with 5mg  dose   Sulfamethoxazole Rash   fever   Sulfonamide Derivatives Rash, Other (See Comments)        Medication List        Accurate as of February 02, 2023 10:49 AM. If you have any questions, ask your nurse or doctor.          albuterol 108 (90 Base) MCG/ACT inhaler Commonly known as: Ventolin HFA Inhale 2 puffs into the lungs every 6 (six) hours as needed for wheezing or shortness of breath.   allopurinol 300 MG tablet Commonly known as: ZYLOPRIM TAKE 1 TABLET(300 MG) BY MOUTH DAILY   aspirin EC 81 MG tablet Take 1 tablet (81 mg total) by mouth daily.   cetirizine 10 MG tablet Commonly known as: ZYRTEC Take 10 mg by mouth daily.   cholecalciferol 1000 units tablet Commonly known as: VITAMIN D Take 1,000 Units by mouth daily.   ciprofloxacin 500 MG tablet Commonly known as: Cipro Take 1 tablet (500 mg total) by mouth 2 (two) times daily.   clomiPHENE 50 MG tablet Commonly known as: CLOMID Take 50 mg by mouth every other day.   colchicine 0.6 MG tablet Take 1 tablet (0.6 mg total) by mouth 2 (two) times daily.   docusate sodium 100 MG capsule Commonly known as: COLACE Take 100 mg by mouth daily.   fluticasone 50 MCG/ACT nasal spray Commonly known as: FLONASE Place 2 sprays into both nostrils daily.   fluticasone-salmeterol 250-50 MCG/ACT Aepb Commonly known as: ADVAIR Inhale 1 puff into the lungs in the morning and at bedtime.   folic acid 1 MG tablet Commonly known as:  FOLVITE Take 1 mg by mouth daily.   FreeStyle Libre 3 Sensor Misc 1 each by Does not apply route every 14 (fourteen) days.   glucose blood test strip Commonly known as: OneTouch Verio 1 each by Other route daily. And lancets 1/day What changed: additional instructions   metoprolol succinate 50 MG 24 hr tablet Commonly known as: TOPROL-XL Take 50 mg by mouth daily. Take with or immediately following a meal.   nitroGLYCERIN 0.4 MG SL tablet Commonly known as: NITROSTAT Place 1 tablet (0.4 mg total) under the tongue every 5 (five) minutes as needed for chest pain.   pantoprazole 40 MG tablet Commonly known as: PROTONIX TAKE 2 TABLETS(80 MG) BY MOUTH DAILY   rosuvastatin 40 MG tablet Commonly known as: CRESTOR TAKE 1 TABLET(40 MG)  BY MOUTH DAILY   Rybelsus 7 MG Tabs Generic drug: Semaglutide Take 1 tablet (7 mg total) by mouth daily.   sacubitril-valsartan 49-51 MG Commonly known as: ENTRESTO Take 1 tablet by mouth 2 (two) times daily. Via NOVARTIS pt assistance   saw palmetto 500 MG capsule Take 500 mg by mouth daily.   Synjardy XR 25-1000 MG Tb24 Generic drug: Empagliflozin-metFORMIN HCl ER Take 1 tablet by mouth daily.   tadalafil 20 MG tablet Commonly known as: CIALIS TAKE ONE TABLET BY MOUTH DAILY AS NEEDED   tiotropium 18 MCG inhalation capsule Commonly known as: SPIRIVA Place 1 capsule (18 mcg total) into inhaler and inhale daily.   ZINC 15 PO Take 1 tablet by mouth daily.        Review of systems negative except as noted in HPI / PMHx or noted below:  ROS  Family History  Problem Relation Age of Onset  . Diabetes Mother   . Hypertension Mother   . Colon polyps Mother   . Diabetes Father   . Heart disease Father 4       Fatal MI  . Heart failure Father   . Colon polyps Father   . Pancreatic cancer Maternal Grandmother   . Stomach cancer Maternal Uncle   . Pancreatic cancer Maternal Uncle   . Colon cancer Neg Hx   . Esophageal cancer Neg  Hx   . Rectal cancer Neg Hx   . Liver disease Neg Hx     Social History   Socioeconomic History  . Marital status: Significant Other    Spouse name: Not on file  . Number of children: 3  . Years of education: Not on file  . Highest education level: Not on file  Occupational History  . Occupation: Unemployed  Tobacco Use  . Smoking status: Former    Current packs/day: 0.00    Average packs/day: 1 pack/day for 10.0 years (10.0 ttl pk-yrs)    Types: Cigarettes    Start date: 05/19/1999    Quit date: 05/18/2009    Years since quitting: 13.7    Passive exposure: Past  . Smokeless tobacco: Never  Vaping Use  . Vaping status: Never Used  Substance and Sexual Activity  . Alcohol use: Yes    Comment: rare  . Drug use: No  . Sexual activity: Yes  Other Topics Concern  . Not on file  Social History Narrative   Married, lives with wife and 3 kids.    Currently student. 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 Difficulty of Paying Living Expenses: Not hard at all  Food Insecurity: No Food Insecurity (06/23/2022)   Hunger Vital Sign   . Worried About Programme researcher, broadcasting/film/video in the Last Year: Never true   . Ran Out of Food in the Last Year: Never true  Transportation Needs: No Transportation Needs (06/23/2022)   PRAPARE - Transportation   . Lack of Transportation (Medical): No   . Lack of Transportation (Non-Medical): No  Physical Activity: Sufficiently Active (06/23/2022)   Exercise Vital Sign   . Days of Exercise per Week: 7 days   . Minutes of Exercise per Session: 30 min  Stress: Stress Concern Present (06/23/2022)   [employer] of Occupational Health - Occupational Stress Questionnaire   . Feeling of Stress : To some extent  Social  Connections: Socially Integrated (06/23/2022)   Social Connection and Isolation Panel [NHANES]   .  Frequency of Communication with Friends and Family: More than three times a week   . Frequency of Social Gatherings with Friends and Family: More than three times a week   . Attends Religious Services: More than 4 times per year   . Active Member of Clubs or Organizations: No   . Attends Banker Meetings: More than 4 times per year   . Marital Status: Living with partner  Intimate Partner Violence: Not At Risk (06/23/2022)   Humiliation, Afraid, Rape, and Kick questionnaire   . Fear of Current or Ex-Partner: No   . Emotionally Abused: No   . Physically Abused: No   . Sexually Abused: No    Environmental and Social history  Lives in a house with a dry environment, no animals located inside the household, carpet in the bedroom, no plastic on the bed, no plastic on the pillow, no smoking ongoing with inside the household.  He is Marine scientist.  Objective:   Vitals:   02/02/23 0954  BP: 110/80  Pulse: 78  Temp: 98.3 F (36.8 C)  SpO2: 97%   Height: 6\' 2"  (188 cm) Weight: 252 lb 1.6 oz (114.4 kg)  Physical Exam  Diagnostics: Allergy skin tests were performed.   Spirometry was performed and demonstrated an FEV1 of *** @ *** % of predicted. FEV1/FVC = ***  The patient had an Asthma Control Test with the following results:  .    Results of a high-resolution chest CT scan obtained 20 Oct 2022 identified the following:  Cardiovascular: Atherosclerotic calcification of the aorta and left anterior descending coronary artery. Heart size normal. No pericardial effusion.   Mediastinum/Nodes: No pathologically enlarged mediastinal or axillary lymph nodes. Hilar regions are difficult to definitively evaluate without IV contrast. Esophagus is grossly unremarkable.   Lungs/Pleura: Negative for subpleural reticulation, traction bronchiectasis/bronchiolectasis, ground-glass, architectural distortion or honeycombing. No perilymphatic nodularity. No pleural fluid. Airway  is unremarkable. Mild air trapping.   Upper Abdomen: Liver may be mildly heterogeneous in attenuation. Cholecystectomy. Visualized portions of the adrenal glands, kidneys, spleen, pancreas, stomach and bowel are grossly unremarkable. No upper abdominal adenopathy.  Results of pulmonary function tests obtained 30 November 2022 identified TLC 63% predicted, RV 102% predicted, DL/VA 440% predicted.  Results of blood tests obtained 13 Oct 2022 identifies IgE antibodies directed against dog, Altern[place], stem [place], negative ANA, angiotensin-converting enzyme 28U/L, negative CCP, negative RA, WBC 5.5, absolute eosinophil 200, absolute lymphocyte 2300, hemoglobin 15.3, platelet 227.  Results of blood tests obtained 01 December 2022 identifies alpha 1 antitrypsin phenotype MM with a level 133 MGs/DL  Results of FeNO obtained 30 November 2022 identifies 23 PBB  Results of echocardiogram obtained 29 Oct 2022 identified the following:   1. Left ventricular ejection fraction, by estimation, is 60 to 65%. The  left ventricle has normal function. The left ventricle has no regional  wall motion abnormalities. GLS - 17.8%.   2. Right ventricular systolic function is normal. The right ventricular  size is normal.   3. The mitral valve is normal in structure. No evidence of mitral valve  regurgitation. No evidence of mitral stenosis.   4. The aortic valve is normal in structure. Aortic valve regurgitation is  not visualized. No aortic stenosis is present.   5. The inferior vena cava is normal in size with greater than 50%  respiratory variability, suggesting right atrial pressure of 3 mmHg.  Assessment and Plan:    1. Not well controlled severe persistent asthma   2. Perennial allergic rhinitis   3. Seasonal allergic rhinitis due to pollen     Patient Instructions   1. Allergen avoidance measures - formaldehyde  2. Blood - K080-IgE Formaldehyde (409811), Hypersensitivity pneumonitis profile, total  IgE  3. Continue Trelegy - 1 inhalation 1 time per day  4. If needed:   A. Airsupra - 2 inhalations every 4-6 hours (replaces albuterol - coupon) B. Flonase- 2 sprays each nostril 3-7 times per week C. Zyrtec 10 mg - 1 tablet 1 time per day  5.  Entresto induced cough???  6. Plan for fall flu vaccine  7. Follow up with pulmonology     Xavier Priest, MD Allergy / Immunology [facility] [place]

## 2023-02-02 NOTE — Patient Instructions (Addendum)
  1. Allergen avoidance measures - formaldehyde, Dust mite, pollens, mold  2. Blood - K080-IgE Formaldehyde (401027), Hypersensitivity pneumonitis profile, total IgE  3. Continue Trelegy - 1 inhalation 1 time per day  4. If needed:   A. Airsupra - 2 inhalations every 4-6 hours (replaces albuterol - coupon) B. Flonase- 2 sprays each nostril 3-7 times per week C. Zyrtec 10 mg - 1 tablet 1 time per day  5.  Entresto induced cough??? Need for Entresto???  6. Plan for fall flu vaccine  7. Follow up with pulmonology

## 2023-02-03 ENCOUNTER — Encounter: Payer: Self-pay | Admitting: Allergy and Immunology

## 2023-02-07 LAB — K080-IGE FORMALDEHYDE: Formaldehyde IgE: <0.1 kU/L

## 2023-02-07 LAB — HYPERSENSITIVITY PNEUMONITIS
A. Pullulans Abs: NEGATIVE
A.Fumigatus #1 Abs: NEGATIVE
Micropolyspora faeni, IgG: NEGATIVE
Pigeon Serum Abs: NEGATIVE
Thermoact. Saccharii: NEGATIVE
Thermoactinomyces vulgaris, IgG: NEGATIVE

## 2023-02-07 LAB — IGE: IgE (Immunoglobulin E), Serum: 56 [IU]/mL (ref 6–495)

## 2023-02-12 ENCOUNTER — Other Ambulatory Visit: Payer: Self-pay | Admitting: Internal Medicine

## 2023-02-15 ENCOUNTER — Telehealth: Payer: Self-pay | Admitting: Internal Medicine

## 2023-02-15 MED ORDER — TRELEGY ELLIPTA 100-62.5-25 MCG/ACT IN AEPB: 1.0000 | INHALATION_SPRAY | Freq: Every day | RESPIRATORY_TRACT | 1 refills | Status: DC

## 2023-02-15 NOTE — Telephone Encounter (Signed)
Patient states needs refill for Trelegy. Pharmacy is [facility] [address]. Patient out of the medication. Patient phone number is [phone].

## 2023-02-15 NOTE — Telephone Encounter (Signed)
Refill sent.

## 2023-02-19 ENCOUNTER — Telehealth: Payer: Self-pay | Admitting: Internal Medicine

## 2023-02-19 NOTE — Telephone Encounter (Signed)
Dr. Marchelle Gearing, please advise on this if you want to put pt on a different medication with the prior auth being denied or what you recommend.

## 2023-02-19 NOTE — Telephone Encounter (Signed)
Needs Trelegy samples. Running out. Waiting for pre-auth team to run thru. Please call PT to advise when ready.

## 2023-02-19 NOTE — Telephone Encounter (Signed)
See previous encounter. PA for Trelegy was denied, office may proceed with an appeal if needed.

## 2023-02-19 NOTE — Telephone Encounter (Signed)
Please see last encounter. PT calling stating he'd like for [place] to send a note to hIs insurance that Trelegy is the recommended med and he can take no substitutes. PT staes he is out of it soon. Thanks.

## 2023-02-22 ENCOUNTER — Other Ambulatory Visit (HOSPITAL_COMMUNITY): Payer: Self-pay

## 2023-02-22 NOTE — Telephone Encounter (Signed)
Can he try breztri 2 puff bid instead. Does insurance approve this? What does insurance agree upon? Sending to pharmacy team to investigate

## 2023-02-22 NOTE — Telephone Encounter (Signed)
Per test claims- patient is showing as having 2 different insurance plans. [employer] is covering [place] at a cost of $152.98 due to being in the coverage gap. Patient other coverage is not contracted with [facility] so I am not able to do a cost determination.

## 2023-02-23 NOTE — Telephone Encounter (Signed)
Insurance has agreed upon a statement. Until this is figured out pt will pick sample tomorrow

## 2023-02-23 NOTE — Telephone Encounter (Signed)
Patient does not want to try Breztri since Trelegy works best for him. Can we do an appeal? Patient states insurance needs a statement from the doctor stating he benefits from Trelegy. Also, patient is completely out of trelegy and would like samples. Please advise.

## 2023-02-24 NOTE — Telephone Encounter (Signed)
Please see encounter from 02-19-2023. Office has started appeal process.

## 2023-02-24 NOTE — Telephone Encounter (Signed)
Ok pls write up an appeal and I will sign

## 2023-02-24 NOTE — Telephone Encounter (Signed)
Appeal letter printed. Placed in Dr. Jane Canary mailbox for signature with clinicals  Once signed, appeal should be faxed with denial letter (in media tab)  Loraine Leriche as "URGENT" or "EXPEDITED" on fax cover letter  Chesley Mires, PharmD, MPH, BCPS, CPP Clinical Pharmacist (Rheumatology and Pulmonology)

## 2023-02-26 NOTE — Telephone Encounter (Signed)
nfn

## 2023-03-10 DIAGNOSIS — E782 Mixed hyperlipidemia: Secondary | ICD-10-CM | POA: Diagnosis not present

## 2023-03-10 DIAGNOSIS — I10 Essential (primary) hypertension: Secondary | ICD-10-CM | POA: Diagnosis not present

## 2023-03-10 DIAGNOSIS — E119 Type 2 diabetes mellitus without complications: Secondary | ICD-10-CM | POA: Diagnosis not present

## 2023-03-10 DIAGNOSIS — J449 Chronic obstructive pulmonary disease, unspecified: Secondary | ICD-10-CM | POA: Diagnosis not present

## 2023-03-15 DIAGNOSIS — M109 Gout, unspecified: Secondary | ICD-10-CM | POA: Diagnosis not present

## 2023-03-15 DIAGNOSIS — E119 Type 2 diabetes mellitus without complications: Secondary | ICD-10-CM | POA: Diagnosis not present

## 2023-03-15 DIAGNOSIS — I10 Essential (primary) hypertension: Secondary | ICD-10-CM | POA: Diagnosis not present

## 2023-03-15 DIAGNOSIS — E785 Hyperlipidemia, unspecified: Secondary | ICD-10-CM | POA: Diagnosis not present

## 2023-03-18 ENCOUNTER — Other Ambulatory Visit: Payer: Self-pay | Admitting: Family Medicine

## 2023-03-18 DIAGNOSIS — N410 Acute prostatitis: Secondary | ICD-10-CM

## 2023-03-30 ENCOUNTER — Other Ambulatory Visit: Payer: Self-pay | Admitting: Internal Medicine

## 2023-04-19 NOTE — Telephone Encounter (Signed)
[name]  Is he all set with his trelegy? I missed this message from a while back. Cannot remember if I signed it or not  Thanks  MR

## 2023-04-19 NOTE — Telephone Encounter (Signed)
Called and spoke to patient. He stated that he was unable to get trelegy. Insurance prefers that he try generic. Advised patient that trelegy does not have generic.   PA team, can you help with this? Thanks

## 2023-04-19 NOTE — Telephone Encounter (Signed)
Called the pt to ask if he was able to get his trelegy- there was no answer- LMTCB.

## 2023-04-19 NOTE — Telephone Encounter (Signed)
Pt was unable to get Trelegy. Insurance telling patient to get a generic brand first

## 2023-04-20 ENCOUNTER — Other Ambulatory Visit (HOSPITAL_COMMUNITY): Payer: Self-pay

## 2023-04-20 NOTE — Telephone Encounter (Signed)
Pt is aware of below message/recommendations and voiced his understanding.  Nothing further needed.

## 2023-04-20 NOTE — Telephone Encounter (Signed)
Per test claim, Trelegy is covered using his [employer] Insurance. Second insurance plan unable to be run for co-pay as it is not contracted with [employer]. Patient co-pay with [facility] is $155.95 due to patient being in the coverage gap.

## 2023-04-23 ENCOUNTER — Ambulatory Visit (INDEPENDENT_AMBULATORY_CARE_PROVIDER_SITE_OTHER): Payer: Medicare Other | Admitting: Internal Medicine

## 2023-04-23 ENCOUNTER — Encounter: Payer: Self-pay | Admitting: Internal Medicine

## 2023-04-23 VITALS — BP 114/74 | HR 87 | Ht 75.0 in | Wt 262.0 lb

## 2023-04-23 DIAGNOSIS — J449 Chronic obstructive pulmonary disease, unspecified: Secondary | ICD-10-CM

## 2023-04-23 MED ORDER — BREZTRI AEROSPHERE 160-9-4.8 MCG/ACT IN AERO: 2.0000 | INHALATION_SPRAY | Freq: Two times a day (BID) | RESPIRATORY_TRACT | Status: DC

## 2023-04-23 NOTE — Progress Notes (Signed)
04/23/2016 Acute OV : Sarcoid   52 yo male followed for Sarcoid w/ pulmonary /neuro/joint involvement , and mild persistent asthma.   TEST  Spirometry 01/14/11 >> FEV1 3.15 (78%), FVC 3.68 (72%), FEV1% 86  Spirometry 12/09/11 >> FEV1 3.25 (80%), FVC 3.85 (76%), FEV1% 84    Last seen in office 3.5 yr ago. Presents today for Sarcoid flare . Complains of joint pain , chest tightness, and increased sob, and dry cough for last couple of months. Worse for last 2 weeks.  Previously on MTX thru Rheumatology. Says he ran out of refills and Rheumatologist would not refill. Marland Kitchen Has been off for over 1-2 years ago.  Was admitted 04/13/16 with chest pain , underwent cardiac cath that showed mild non obstructive CAD. CXR showed nad. Nml H/H.  He denies fever, discolored mucus, orthopena, edema or hemotpysis. Occasional rash but nothing right now  OV 01/25/2020  Subjective:  Patient ID: [name], male , DOB: 05-31-1971 , age 23 y.o. , MRN: [id number] , ADDRESS: [address] [place] [address]  PCP [name], MD  01/25/2020 -   Chief Complaint  Patient presents with   Consult    No complaints    HPI AMONDRE LASEK 52 y.o. -this is a new consult because he is reestablishing care. He was last seen in our practice more than 3 years ago. Therefore this is a new visit. 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 He tells me that in 2010 he had sarcoidosis diagnosed when he had diffuse arthralgia shortness of breath cough fever, malaise weight loss [B symptoms] and at that point in time also had arthralgia. He also had seizures. Has not had seizures now for 6 years. He was diagnosed with sarcoidosis. He was placed on methotrexate once a week which she is continued since 2010. His last seizure was 6 years ago. At baseline now he only has mild shortness of breath with exertion but there is also some variability with it when he exerts at some points he does not have  shortness of breath but at some point he does have shortness of breath. There is no shortness of breath at rest. He has some baseline arthralgia. He says overall methotrexate is doing well for him. Then in the background of all this couple of times a year he will have a respiratory flare along with joint pain and tiredness. For this he will be treated with a short course of prednisone. He is not on maintenance prednisone. He is just immunosuppressed with methotrexate. Then most recently in July 2021 he had another flareup with joint pain tiredness shortness of breath but no fevers. However he did have night sweats. He was given 10-day prednisone and is now nearly back to his baseline. His shortness of breath is baseline but he still has a residual cough that is occasional but it is improving. He decided to reestablish with a pulmonologist and therefore is here to see me. He wants to make sure his lungs are okay.  Of note he has significant baseline environmental allergies. He reports being "awful with pollen" not otherwise specified.   His left ventricular ejection fraction in May 2021 was 55-60%. Diastolic parameters were considered normal.  His blood work in August 2021 showed creatinine normal 0.75 mg percent. Hemoglobin normal 14.3 g%. His blood eosinophils was 100 cells per cubic millimeter.  His hemoglobin A1c was elevated at 7.5   His CT  abdomen lung images in August 2021 was clear   ROS - per HPI     OV 03/21/2020  Subjective:  Patient ID: [name], male , DOB: 08/23/1970 , age 46 y.o. , MRN: [id number] , ADDRESS: [address] [place] [address] PCP [name], MD Patient Care Team: [name], MD as PCP - General (Internal Medicine) Lars Masson, MD as PCP - Cardiology (Cardiology) Virgilio Frees as Consulting Physician (Rheumatology) Loyal Gambler, MD as Referring Physician (Rheumatology) [name], MD as Consulting Physician  (Pulmonary Disease) Romero Belling, MD as Consulting Physician (Endocrinology) Lars Masson, MD (Cardiology) Kathyrn Sheriff, [facility] as Pharmacist (Pharmacist)  This Provider for this visit: Treatment Team:  Attending Provider: Kalman Shan, MD    03/21/2020 -   Chief Complaint  Patient presents with   Follow-up    Hx of Sarcoidosis     HPI ARKIN HEMSWORTH 52 y.o. -presents for follow-up to discuss his test results.  He did not do the CBC and blood IgE and allergy profile.  Nevertheless overall his cough is nearly resolved with time.  He only has very mild shortness of breath on occasion.  He had high-resolution CT chest that did not show any evidence of ILD or mediastinal adenopathy.  No evidence of overt sarcoidosis.  Occasional scattered 3 mm nodules.  There is no evidence of lung cancer pneumonia emphysema either.  With asthma being the other differential diagnosis we checked exhaled nitric oxide test today and it is 24 and normal.  He had full pulmonary function test that shows variations particularly with a flow volume loop as the major abnormality.  No results found for: NITRICOXIDE   No results found for: NITRICOXIDE   CT chest high resolution 02/07/20  IMPRESSION: 1. No evidence of fibrotic interstitial lung disease. 2. Occasional tiny pulmonary nodules, the largest a 3 mm pulmonary nodule of the right pulmonary apex. No follow-up needed if patient is low-risk (and has no known or suspected primary neoplasm). Non-contrast chest CT can be considered in 12 months if patient is high-risk. This recommendation follows the consensus statement: Guidelines for Management of Incidental Pulmonary Nodules Detected on CT Images: From the Fleischner Society 2017; Radiology 2017; 284:228-243. 3. Coronary artery disease.  Aortic Atherosclerosis (ICD10-I70.0).     Electronically Signed   By: Lauralyn Primes M.D.   On: 02/07/2020 12:05    ROS - per HPI   Results  for KAS, SHYNE (MRN [id number]) as of 03/21/2020 10:06  Ref. Range 08/10/2008 17:12 08/10/2008 20:52  Anti Nuclear Antibody (ANA) Latest Ref Range: NEGATIVE   NEG  Angiotensin 1 Converting Enzyme Latest Ref Range: 9 - 67 units/L  38  RA Latex Turbid. Latest Ref Range: 0.0 - 20.0 intl units/mL < 20.0 IU/mL (L)     Results for JUANITA, ZEISLOFT (MRN [id number]) as of 03/21/2020 10:06  Ref. Range 11/03/2017 09:32 05/24/2018 11:38 06/14/2018 15:56 08/29/2019 08:47 01/09/2020 08:11  Eosinophils Absolute Latest Ref Range: 0.0 - 0.5 K/uL 0.2 0.2 0.2 0.1 0.1      OV 10/13/2022  Subjective:  Patient ID: [name], male , DOB: Mar 07, 1971 , age 63 y.o. , MRN: [id number] , ADDRESS: [address] [place] [address] PCP [name], MD Patient Care Team: [name], MD as PCP - General (Internal Medicine) Lars Masson, MD as PCP - Cardiology (Cardiology) Loyal Gambler, MD as Referring Physician (Rheumatology) [name], MD as Consulting Physician (Pulmonary Disease) Delton See,  Faustino Congress, MD (Cardiology) Mateo Flow, MD as Consulting Physician (Ophthalmology) Carlus Pavlov, MD as Consulting Physician (Endocrinology) Meryl Dare, MD as Consulting Physician (Gastroenterology)  This Provider for this visit: Treatment Team:  Attending Provider: Kalman Shan, MD    10/13/2022 -   Chief Complaint  Patient presents with   Follow-up    Sarcoidosis, sob, fatigue, wheezing, and cough.   Has history of sarcoidosis but 2021 CT chest with micronodules.  He had abnormal flow-volume loop pattern.  HPI AAYDEN SALVATI 52 y.o. -returns for follow-up.  Not seen in 2-1/2 years.  States he was doing well till early 2024 and started having unintentional weight loss for the last 4 months.  Then for the last few weeks having constellation of B symptoms.  Having shortness of breath.  Having dry cough with wheezing having fatigue having pain in the upper back having  knee arthralgia.  Having heart palpitations.  Also having night sweats but no fever.  No rashes no erythema nodosum no chills.  Symptoms are reminiscent to him of previous diagnosis of sarcoidosis.  This morning he also woke up and had some atypical chest pain in the sternal area associated with cough.  He has a cardiologist Dr. Sharyn Lull and has not contacted him about the chest pain.  It was not exertional and there is no diaphoresis there is no radiation.  It was associated with cough.  He is worried about the symptoms.  Medical assistant tried to make him do a nitric oxide exhaled test but he could not perform the maneuver.  In the past he had abnormal flow-volume pattern with his pulmonary function testing.  I referred him to ENT but I doubt he has seen ENT.   11/30/2022 Patient presents today to review testing results. Hx sarcoid, patient of Dr. Marchelle Gearing.  Patient was last seen in May.  Previous to this he had not been seen for 2-1/2 years.  He was doing well until early 2024 and started having unintentional weight loss and shortness of breath.  Associated dry cough with wheezing and fatigue.  Unclear nature of symptoms.  Could be sarcoid or something else.  He was ordered for lab work, HRCT, echocardiogram and pulmonary function testing along with  He is doing well today without acute complaints. No change to above respiratory symptoms since consult in May. HRCT chest showed no evidence of sarcoid or interstitial lung disease.  Air-trapping indicative of small airway disease.  Advanced left anterior descending coronary artery calcification.  Echocardiogram completed on 10/29/22 showing normal EF, mild concentric left ventricular hypertrophy.  LV diastolic parameters normal.  Pulmonary function testing today showed severe obstructive lung disease, FEV1 19% predicted after bronchodilator.  Normal diffusion capacity. Lab work was unrevealing.  ACE level 27. Class III allergy to Alternaria Alternata. Eos  absolute 200. Mild elevated sed rate.   Pulmonary function testing 11/30/2022 >> FVC 2.46 (41%), FEV1 0.88 (19%), ratio 36. TLC 63%, DLCO 29.50 (86%)   11/30/2022 FENO >> 27  OV 04/23/2023  Subjective:  Patient ID: [name], male , DOB: [birth year] , age 89 y.o. , MRN: [id number] , ADDRESS: [address] [place] [address] PCP [name], MD Patient Care Team: [name], MD as PCP - General (Internal Medicine) Lars Masson, MD as PCP - Cardiology (Cardiology) Loyal Gambler, MD as Referring Physician (Rheumatology) [name], MD (Inactive) as Consulting Physician (Pulmonary Disease) Lars Masson, MD (Cardiology) Mateo Flow, MD as Consulting Physician (Ophthalmology) Elvera Lennox,  Silvestre Mesi, MD as Consulting Physician (Endocrinology) Meryl Dare, MD as Consulting Physician (Gastroenterology)  This Provider for this visit: Treatment Team:  Attending Provider: Kalman Shan, MD    04/23/2023 -   Chief Complaint  Patient presents with   Follow-up    Breathing is stable today. He has some cough and wheezing occ. He is using [place] twice daily.      HPI Carnie Crispino Panozzo 52 y.o. -presents for follow-up.  I had not seen him in 2 and half years and then I saw him in May 2024 with night sweats and chills.  I was really worried about B symptoms and sarcoidosis developing.  He then had workup for that and saw nurse practitioner.  It appears he he has advanced obstructive lung disease stage IV.  Almost like a COPD patient but he has not smoked more than 10 pack smoking history.  He did not have any evidence of active sarcoid his blood eosinophils were normal.  No interstitial lung disease.  Nurse practitioner did place him on Trelegy which worked really well for him.Marland Kitchen  His alpha-1 antitrypsin was normal.  However his insurance is refusing to approve his Trelegy.  They want him to try Spiriva and Advair.  Instead he is on [place] which she  got it for a lower cost.  It is not helping him as well.  He takes it as needed every few days.  Up sometimes even twice a day.  He is willing to try Spiriva and Advair based on insurance recommendations.  There are no other new problems.  Without his Trelegy he feels more symptomatic in terms of shortness of breath and cough.  His current symptom score is below.    SYMPTOM SCALE -general symptom scale on Airsupra 04/23/2023  Current weight   O2 use Ra -taking Airsupra as needed. -> will start Spiriva/Advair  Shortness of Breath 0 -> 5 scale with 5 being worst (score 6 If unable to do)  At rest 0  Simple tasks - showers, clothes change, eating, shaving 2  Household (dishes, doing bed, laundry) 2  Shopping 2  Walking level at own pace 2  Walking up Stairs 5  Total (30-36) Dyspnea Score 13  How bad is your cough? 2  How bad is your fatigue 3  How bad is nausea 0  How bad is vomiting?  0  How bad is diarrhea? 0  How bad is anxiety? 0  How bad is depression 0  Any chronic pain - if so where and how bad 0       PFT    Latest Ref Rng & Units 11/30/2022    2:58 PM 03/21/2020    9:00 AM  PFT Results  FVC-Pre L 3.41  3.71   FVC-Predicted Pre % 57  73   FVC-Post L 2.46  3.47   FVC-Predicted Post % 41  69   Pre FEV1/FVC % % 39  88   Post FEV1/FCV % % 36  85   FEV1-Pre L 1.33  3.25   FEV1-Predicted Pre % 28  80   FEV1-Post L 0.88  2.96   DLCO uncorrected ml/min/mmHg 30.06  28.93   DLCO UNC% % 87  83   DLCO corrected ml/min/mmHg 29.50  28.46   DLCO COR %Predicted % 86  82   DLVA Predicted % 146  128   TLC L 5.06  5.29   TLC % Predicted % 63  66   RV % Predicted %  102  83      Latest Reference Range & Units 07/30/08 09:50 08/10/08 17:12 10/02/08 17:07 11/06/08 16:44 03/01/09 17:19 03/27/09 09:46 05/14/09 11:04 06/17/09 14:05 06/24/09 12:36 07/01/09 23:00 07/22/09 23:29 01/07/10 11:13 01/12/10 16:13 01/31/10 11:25 04/11/10 10:32 06/24/10 15:20 01/14/11 14:35 01/26/11 02:38  07/09/11 09:46 11/22/13 16:08 05/14/14 13:49 06/29/17 09:23 09/15/17 14:38 09/17/17 11:24 09/19/17 05:00 11/03/17 09:32 05/24/18 11:38 06/14/18 15:56 08/29/19 08:47 01/09/20 08:11 07/05/20 09:17 10/20/21 04:47 08/14/22 09:50 10/13/22 12:13  Eosinophils Absolute 0.0 - 0.7 K/uL 0.2 0.3 0.0 0.0 0.0 0.2 0.2 0.3 0.2 0.3 0.3 0.2 0.1 0.2 0.2 0.3 0.4 0.1 0.3 0.2 0.3 0.2 0.2 0.3 0.2 0.2 0.2 0.2 0.1 0.1 0.1 0.2 0.3 0.2  + Next patient ready   LAB RESULTS last 96 hours No results found.  LAB RESULTS last 90 days Recent Results (from the past 2160 hour(s))  POCT urinalysis dipstick     Status: Abnormal   Collection Time: 02/01/23  9:33 AM  Result Value Ref Range   Color, UA pale yellow    Clarity, UA clear    Glucose, UA Positive (A) Negative    Comment: 1000+++   Bilirubin, UA neg    Ketones, UA 5     Comment: +   Spec Grav, UA 1.010 1.010 - 1.025   Blood, UA 5-10    pH, UA 6.0 5.0 - 8.0   Protein, UA Positive (A) Negative    Comment: 15+   Urobilinogen, UA negative (A) 0.2 or 1.0 E.U./dL   Nitrite, UA neg    Leukocytes, UA Trace (A) Negative    Comment: 15+   Appearance     Odor    Urine Culture     Status: None   Collection Time: 02/01/23  4:24 PM   Specimen: Urine  Result Value Ref Range   Source: URINE    Status: FINAL    Result: No Growth   [id number] Formaldehyde     Status: None   Collection Time: 02/02/23 11:25 AM  Result Value Ref Range   Class Description Allergens Comment     Comment:     Levels of Specific IgE       Class  Description of Class     ---------------------------  -----  --------------------                    < 0.10         0         Negative            0.10 -    0.31         0/I       Equivocal/Low            0.32 -    0.55         I         Low            0.56 -    1.40         II        Moderate            1.41 -    3.90         III       High            3.91 -   19.00         IV        Very High  19.01 -  100.00         V         Very High                    >100.00         VI        Very High    Formaldehyde IgE <0.10 Class 0 kU/L  Hypersensitivity Pneumonitis     Status: None   Collection Time: 02/02/23 11:25 AM  Result Value Ref Range   A.Fumigatus #1 Abs Negative Negative   Micropolyspora faeni, IgG Negative Negative   Thermoactinomyces vulgaris, IgG Negative Negative   A. Pullulans Abs Negative Negative   Thermoact. Saccharii Negative Negative   Pigeon Serum Abs Negative Negative  IgE     Status: None   Collection Time: 02/02/23 11:25 AM  Result Value Ref Range   IgE (Immunoglobulin E), Serum 56 6 - 495 IU/mL         has a past medical history of Anginal pain (HCC), Arthritis, Arthritis, Asthma, CORONARY ARTERY DISEASE, DIABETES MELLITUS, TYPE II, Edema, ERECTILE DYSFUNCTION, ORGANIC, GERD, Gout, unspecified, Heart murmur, HYPERLIPIDEMIA, HYPERTENSION, Narcolepsy without cataplexy(347.00), Pericarditis, POLYNEUROPATHY, PULMONARY SARCOIDOSIS, Seizures (HCC), TESTICULAR HYPOFUNCTION, and TIA (transient ischemic attack).   reports that he quit smoking about 13 years ago. His smoking use included cigarettes. He started smoking about 23 years ago. He has a 10 pack-year smoking history. He has been exposed to tobacco smoke. He has never used smokeless tobacco.  Past Surgical History:  Procedure Laterality Date   BRONCHOSCOPY  08/21/08   CARDIAC CATHETERIZATION  06/25/2009   minimal disease   CARDIAC CATHETERIZATION N/A 04/14/2016   Procedure: Left Heart Cath and Coronary Angiography;  Surgeon: Kathleene Hazel, MD;  Location: [facility] INVASIVE CV LAB;  Service: Cardiovascular;  Laterality: N/A;   CHOLECYSTECTOMY N/A 09/21/2017   Procedure: LAPAROSCOPIC CHOLECYSTECTOMY WITH INTRAOPERATIVE CHOLANGIOGRAM;  Surgeon: Glenna Fellows, MD;  Location: [facility] ORS;  Service: General;  Laterality: N/A;   LEFT HEART CATHETERIZATION WITH CORONARY ANGIOGRAM N/A 05/18/2014   Procedure: LEFT HEART CATHETERIZATION WITH CORONARY ANGIOGRAM;   Surgeon: Corky Crafts, MD;  Location: [facility] CATH LAB;  Service: Cardiovascular;  Laterality: N/A;   MEDIASTINOSCOPY  11/30/08    Allergies  Allergen Reactions   Amlodipine Other (See Comments)    Ankle swelling with 10mg , ok with 5mg  dose   Sulfamethoxazole Rash    fever   Sulfonamide Derivatives Rash and Other (See Comments)    Immunization History  Administered Date(s) Administered   Influenza Split 04/06/2011, 02/17/2012, 03/16/2012   Influenza, Quadrivalent, Recombinant, Inj, Pf 02/20/2022   Influenza,inj,Quad PF,6+ Mos 03/21/2013, 03/08/2014, 04/03/2015, 02/19/2016, 02/17/2017, 06/14/2018, 02/20/2019, 03/21/2020   Influenza-Unspecified 03/08/2021, 02/07/2023   Moderna Sars-Covid-2 Vaccination 07/31/2019, 09/05/2019, 04/19/2020, 02/20/2022   Pfizer(Comirnaty)Fall Seasonal Vaccine 12 years and older 02/20/2022   Pneumococcal Conjugate-13 04/29/2015   Pneumococcal Polysaccharide-23 11/06/2015   Tdap 03/21/2013    Family History  Problem Relation Age of Onset   Diabetes Mother    Hypertension Mother    Colon polyps Mother    Diabetes Father    Heart disease Father 57       Fatal MI   Heart failure Father    Colon polyps Father    Pancreatic cancer Maternal Grandmother    Stomach cancer Maternal Uncle    Pancreatic cancer Maternal Uncle    Colon cancer Neg Hx    Esophageal cancer Neg Hx    Rectal cancer Neg  Hx    Liver disease Neg Hx      Current Outpatient Medications:    Albuterol-Budesonide (AIRSUPRA) 90-80 MCG/ACT AERO, Inhale 2 puffs into the lungs every 4 (four) hours as needed., Disp: 10.7 g, Rfl: 1   allopurinol (ZYLOPRIM) 300 MG tablet, TAKE 1 TABLET(300 MG) BY MOUTH DAILY, Disp: 90 tablet, Rfl: 1   aspirin EC 81 MG tablet, Take 1 tablet (81 mg total) by mouth daily., Disp: 30 tablet, Rfl: 3   Budeson-Glycopyrrol-Formoterol (BREZTRI AEROSPHERE) 160-9-4.8 MCG/ACT AERO, Inhale 2 puffs into the lungs in the morning and at bedtime., Disp: , Rfl:     cetirizine (ZYRTEC) 10 MG tablet, Take 10 mg by mouth daily., Disp: , Rfl:    cholecalciferol (VITAMIN D) 1000 units tablet, Take 1,000 Units by mouth daily., Disp: , Rfl:    clomiPHENE (CLOMID) 50 MG tablet, Take 50 mg by mouth every other day., Disp: , Rfl:    colchicine 0.6 MG tablet, Take 1 tablet (0.6 mg total) by mouth 2 (two) times daily., Disp: 180 tablet, Rfl: 0   Continuous Glucose Sensor (FREESTYLE LIBRE 3 SENSOR) MISC, 1 EACH BY DOES NOT APPLY ROUTE EVERY 14 (FOURTEEN) DAYS., Disp: 6 each, Rfl: 3   docusate sodium (COLACE) 100 MG capsule, Take 100 mg by mouth daily., Disp: , Rfl:    Empagliflozin-metFORMIN HCl ER (SYNJARDY XR) 25-1000 MG TB24, Take 1 tablet by mouth daily., Disp: 90 tablet, Rfl: 3   fluticasone (FLONASE) 50 MCG/ACT nasal spray, Place 2 sprays into both nostrils daily., Disp: 16 g, Rfl: 6   folic acid (FOLVITE) 1 MG tablet, Take 1 mg by mouth daily., Disp: , Rfl: 1   glucose blood (ONETOUCH VERIO) test strip, 1 each by Other route daily. And lancets 1/day (Patient taking differently: 1 each by Other route daily.), Disp: 100 each, Rfl: 11   metoprolol succinate (TOPROL-XL) 50 MG 24 hr tablet, Take 50 mg by mouth daily. Take with or immediately following a meal., Disp: , Rfl:    nitroGLYCERIN (NITROSTAT) 0.4 MG SL tablet, Place 1 tablet (0.4 mg total) under the tongue every 5 (five) minutes as needed for chest pain., Disp: 25 tablet, Rfl: 3   pantoprazole (PROTONIX) 40 MG tablet, TAKE 2 TABLETS(80 MG) BY MOUTH DAILY, Disp: 180 tablet, Rfl: 0   rosuvastatin (CRESTOR) 40 MG tablet, TAKE 1 TABLET(40 MG) BY MOUTH DAILY, Disp: 90 tablet, Rfl: 0   sacubitril-valsartan (ENTRESTO) 49-51 MG, Take 1 tablet by mouth 2 (two) times daily. Via NOVARTIS pt assistance, Disp: , Rfl:    saw palmetto 500 MG capsule, Take 500 mg by mouth daily., Disp: , Rfl:    Semaglutide (RYBELSUS) 7 MG TABS, Take 1 tablet (7 mg total) by mouth daily., Disp: 90 tablet, Rfl: 3   tadalafil (CIALIS) 20 MG  tablet, TAKE ONE TABLET BY MOUTH DAILY AS NEEDED, Disp: 20 tablet, Rfl: 11   Zinc Sulfate (ZINC 15 PO), Take 1 tablet by mouth daily., Disp: , Rfl:       Objective:   Vitals:   04/23/23 1058  BP: 114/74  Pulse: 87  SpO2: 100%  Weight: 262 lb (118.8 kg)  Height: 6\' 3"  (1.905 m)    Estimated body mass index is 32.75 kg/m as calculated from the following:   Height as of this encounter: 6\' 3"  (1.905 m).   Weight as of this encounter: 262 lb (118.8 kg).  @WEIGHTCHANGE @  Filed Weights   04/23/23 1058  Weight: 262 lb (118.8 kg)  Physical Exam   General: No distress. Looks well O2 at rest: no Cane present: no Sitting in wheel chair: no Frail: no Obese: no Neuro: Alert and Oriented x 3. GCS 15. Speech normal Psych: Pleasant Resp:  Barrel Chest - no.  Wheeze - no, Crackles - no, No overt respiratory distress CVS: Normal heart sounds. Murmurs - no Ext: Stigmata of Connective Tissue Disease - no HEENT: Normal upper airway. PEERL +. No post nasal drip        Assessment:       ICD-10-CM   1. Stage 4 very severe COPD by GOLD classification (HCC)  J44.9 Pulmonary function test         Plan:     Patient Instructions     ICD-10-CM   1. Stage 4 very severe COPD by GOLD classification (HCC)  J44.9       -Behaving like advanced COPD even without significant smoking history - Too bad Trelegy is not being approved by insurance -Understandably Paulene Floor is not working too well for you  Plan - We will follow insurance recommendations and prescribe Spiriva and Advair for you  -Meanwhile take samples of Breztri and take it 2 puffs 2 times daily while waiting for insurance supply of Spiriva and Advair -Do spirometry and DLCO in 6 months  -Based on the response we could look at other medications that are not being newly approved   Followup -6 months 15-minute visit nurse practitioner or Dr. Sherrin Daisy Return in about 6 months (around 10/21/2023) for 15  min visit, after Cleda Daub and DLCO, with Dr Marchelle Gearing, copd.    SIGNATURE    Dr. Kalman Shan, M.D., F.C.C.P,  Pulmonary and Critical Care Medicine Staff Physician, [facility] Center Director - Interstitial Lung Disease  Program  Pulmonary Fibrosis [facility] Network at [facility] [place], [place], [address]  Pager: [phone], If no answer or between  15:00h - 7:00h: call [phone] Telephone: [phone]  11:25 AM 04/23/2023

## 2023-04-23 NOTE — Patient Instructions (Addendum)
ICD-10-CM   1. Stage 4 very severe COPD by GOLD classification (HCC)  J44.9       -Behaving like advanced COPD even without significant smoking history - Too bad Trelegy is not being approved by insurance -Understandably Paulene Floor is not working too well for you  Plan - We will follow insurance recommendations and prescribe Spiriva and Advair for you  -Meanwhile take samples of Breztri and take it 2 puffs 2 times daily while waiting for insurance supply of Spiriva and Advair -Do spirometry and DLCO in 6 months  -Based on the response we could look at other medications that are not being newly approved   Followup -6 months 15-minute visit nurse practitioner or Dr. Marchelle Gearing

## 2023-05-14 DIAGNOSIS — N401 Enlarged prostate with lower urinary tract symptoms: Secondary | ICD-10-CM | POA: Diagnosis not present

## 2023-05-14 DIAGNOSIS — N5201 Erectile dysfunction due to arterial insufficiency: Secondary | ICD-10-CM | POA: Diagnosis not present

## 2023-05-14 DIAGNOSIS — E291 Testicular hypofunction: Secondary | ICD-10-CM | POA: Diagnosis not present

## 2023-05-14 DIAGNOSIS — R351 Nocturia: Secondary | ICD-10-CM | POA: Diagnosis not present

## 2023-05-20 ENCOUNTER — Other Ambulatory Visit: Payer: Self-pay | Admitting: Internal Medicine

## 2023-05-20 ENCOUNTER — Other Ambulatory Visit: Payer: Self-pay | Admitting: Family Medicine

## 2023-05-20 DIAGNOSIS — I2510 Atherosclerotic heart disease of native coronary artery without angina pectoris: Secondary | ICD-10-CM

## 2023-05-20 DIAGNOSIS — E785 Hyperlipidemia, unspecified: Secondary | ICD-10-CM

## 2023-05-20 DIAGNOSIS — N410 Acute prostatitis: Secondary | ICD-10-CM

## 2023-05-25 ENCOUNTER — Encounter: Payer: Self-pay | Admitting: Family Medicine

## 2023-05-25 ENCOUNTER — Ambulatory Visit (INDEPENDENT_AMBULATORY_CARE_PROVIDER_SITE_OTHER): Payer: Medicare Other | Admitting: Family Medicine

## 2023-05-25 VITALS — BP 136/92 | HR 84 | Temp 97.9°F | Ht 75.0 in | Wt 246.4 lb

## 2023-05-25 DIAGNOSIS — R062 Wheezing: Secondary | ICD-10-CM

## 2023-05-25 DIAGNOSIS — J449 Chronic obstructive pulmonary disease, unspecified: Secondary | ICD-10-CM | POA: Diagnosis not present

## 2023-05-25 DIAGNOSIS — J069 Acute upper respiratory infection, unspecified: Secondary | ICD-10-CM

## 2023-05-25 DIAGNOSIS — D869 Sarcoidosis, unspecified: Secondary | ICD-10-CM

## 2023-05-25 DIAGNOSIS — R051 Acute cough: Secondary | ICD-10-CM

## 2023-05-25 DIAGNOSIS — R5383 Other fatigue: Secondary | ICD-10-CM | POA: Diagnosis not present

## 2023-05-25 MED ORDER — IPRATROPIUM-ALBUTEROL 0.5-2.5 (3) MG/3ML IN SOLN: 3.0000 mL | RESPIRATORY_TRACT | 1 refills | Status: DC | PRN

## 2023-05-25 MED ORDER — IPRATROPIUM-ALBUTEROL 0.5-2.5 (3) MG/3ML IN SOLN
3.0000 mL | RESPIRATORY_TRACT | 1 refills | Status: DC | PRN
Start: 2023-05-25 — End: 2023-05-25

## 2023-05-25 MED ORDER — HYDROCODONE BIT-HOMATROP MBR 5-1.5 MG/5ML PO SOLN: 5.0000 mL | Freq: Three times a day (TID) | ORAL | 0 refills | Status: DC | PRN

## 2023-05-25 MED ORDER — METHYLPREDNISOLONE ACETATE 80 MG/ML IJ SUSP
80.0000 mg | Freq: Once | INTRAMUSCULAR | Status: AC
  Administered 2023-05-25: 80 mg via INTRAMUSCULAR

## 2023-05-25 MED ORDER — PREDNISONE 20 MG PO TABS
40.0000 mg | ORAL_TABLET | Freq: Every day | ORAL | 0 refills | Status: AC
Start: 2023-05-25 — End: 2023-05-30

## 2023-05-25 MED ORDER — IPRATROPIUM-ALBUTEROL 0.5-2.5 (3) MG/3ML IN SOLN
3.0000 mL | RESPIRATORY_TRACT | Status: AC
  Administered 2023-05-25: 3 mL via RESPIRATORY_TRACT

## 2023-05-25 MED ORDER — METHYLPREDNISOLONE SODIUM SUCC 125 MG IJ SOLR: 125.0000 mg | Freq: Once | INTRAMUSCULAR | Status: DC

## 2023-05-25 MED ORDER — AZITHROMYCIN 250 MG PO TABS
ORAL_TABLET | ORAL | 0 refills | Status: DC
Start: 2023-05-25 — End: 2023-08-03

## 2023-05-25 NOTE — Addendum Note (Signed)
Addended byParticia Nearing on: 05/25/2023 10:24 AM   Modules accepted: Orders

## 2023-05-25 NOTE — Addendum Note (Signed)
Addended by: Sherald Barge on: 05/25/2023 10:49 AM   Modules accepted: Orders

## 2023-05-25 NOTE — Patient Instructions (Addendum)
You have received a steroid injection in the office today.  You have received a nebulizer treatment in the office today.  This may increase your heart rate some and make you a little bit jittery.  I have sent in prednisone for you to take 2 tablets once daily in the morning with breakfast for the next 5 days.  I have sent in hydrocodone cough syrup for you to take 5 mL once daily in the evening as needed for cough.  This medication may make you sleepy.  Do not drive or operate heavy machinery while taking this medication.  I have sent in azithromycin for you to take.  Take 2 tablets today, then 1 tablet daily for the next 4 days.  I have sent in solution for you to use in your nebulizer.  I would recommend using this about every 4 hours for the next 2 days just to help keep you open and keep air moving well throughout your lungs.  I have also sent in a nebulizer machine to your pharmacy.  If they do not have one in stock, it may be a bit easier and faster for you to grab 1 on [employer].  I have attached instructions on how to use this as well.  Follow-up with me for new or worsening symptoms.

## 2023-05-25 NOTE — Progress Notes (Addendum)
Acute Office Visit  Subjective:     Patient ID: [name], male    DOB: 1970-06-21, 52 y.o.   MRN: [id number]  Chief Complaint  Patient presents with   Cough    Cold like symptoms for the past week and a half. Cough with phlegm. Notes wife had similar symptoms which have since subsided. Currently treating with Alkaseltzer+, dayquil, honey, tea, ginger. PT does have COPD   Fatigue   Facial Pain   Nasal Congestion    Notes it began feeling like sinus pressure/congestion and has now developed into the chest. Past history of seasonal allergies    HPI Patient is in today for evaluation of SOB, cough, fatigue for the last 10 days. States that cough is more productive than usual.  Has history of COPD, sarcoidosis. He is followed by Dr Marchelle Gearing with pulmonology. Has tried Alka-Seltzer plus, DayQuil, honey, tea, ginger with no relief.  Cough, SOB are worse at night. Does not monitor O2 sats at home. Does not have home oxygen. Reports that he has been taking Breztri as prescribed. Also has Airspuria inhaler for prn use. Has been using Airspuria 3-5 times per day during this illness. Reports that his wife was sick with a respiratory illness and passed it to him. States that she has recovered and he seems to be getting worse. Denies  abdominal pain, nausea, vomiting, diarrhea, rash, fever, chills, other symptoms.  Medical hx as outlined below.  ROS Per HPI      Objective:    BP (!) 136/92 (BP Location: Right Arm, Patient Position: Sitting)   Pulse 84   Temp 97.9 F (36.6 C) (Oral)   Ht 6\' 3"  (1.905 m)   Wt 246 lb 6.4 oz (111.8 kg)   SpO2 97%   BMI 30.80 kg/m    Physical Exam Vitals and nursing note reviewed.  Constitutional:      General: He is not in acute distress.    Comments: Appears fatigued   HENT:     Head: Normocephalic and atraumatic.     Nose: Congestion present.     Mouth/Throat:     Mouth: Mucous membranes are moist.     Pharynx: Oropharynx is clear.  No oropharyngeal exudate or posterior oropharyngeal erythema.  Eyes:     Extraocular Movements: Extraocular movements intact.  Cardiovascular:     Rate and Rhythm: Regular rhythm. Tachycardia present.     Heart sounds: Normal heart sounds.  Pulmonary:     Effort: Pulmonary effort is normal. No respiratory distress.     Breath sounds: No stridor. Examination of the right-upper field reveals rhonchi. Examination of the left-upper field reveals rhonchi. Examination of the right-middle field reveals wheezing. Examination of the left-middle field reveals wheezing. Examination of the right-lower field reveals decreased breath sounds. Examination of the left-lower field reveals decreased breath sounds. Decreased breath sounds, wheezing and rhonchi present. No rales.  Musculoskeletal:     Cervical back: Normal range of motion.  Lymphadenopathy:     Cervical: Cervical adenopathy present.  Skin:    General: Skin is warm.     Capillary Refill: Capillary refill takes less than 2 seconds.  Neurological:     General: No focal deficit present.     Mental Status: He is alert and oriented to person, place, and time.     No results found for any visits on 05/25/23.      Assessment & Plan:  1. Acute cough (Primary)  - HYDROcodone bit-homatropine (HYCODAN) 5-1.5  MG/5ML syrup; Take 5 mLs by mouth every 8 (eight) hours as needed for cough.  Dispense: 120 mL; Refill: 0  2. Stage 4 very severe COPD by GOLD classification (HCC)  - predniSONE (DELTASONE) 20 MG tablet; Take 2 tablets (40 mg total) by mouth daily for 5 days.  Dispense: 10 tablet; Refill: 0 - For home use only DME Nebulizer machine  3. Sarcoidosis (HCC)  - Sarcoid flare vs acute illness vs COPD exacerbation -F/u with pulmonology as scheduled  4. Other fatigue  -Plenty of rest and fluids while you are recovering  5. Upper respiratory tract infection, unspecified type  - azithromycin (ZITHROMAX Z-PAK) 250 MG tablet; Take 2 tablets  (500 mg) PO today, then 1 tablet (250 mg) PO daily x4 days.  Dispense: 6 tablet; Refill: 0 - predniSONE (DELTASONE) 20 MG tablet; Take 2 tablets (40 mg total) by mouth daily for 5 days.  Dispense: 10 tablet; Refill: 0 - For home use only DME Nebulizer machine  6. Wheezing  - ipratropium-albuterol (DUONEB) 0.5-2.5 (3) MG/3ML SOLN; Take 3 mLs by nebulization every 4 (four) hours as needed.  Dispense: 360 mL; Refill: 1 - predniSONE (DELTASONE) 20 MG tablet; Take 2 tablets (40 mg total) by mouth daily for 5 days.  Dispense: 10 tablet; Refill: 0 - For home use only DME Nebulizer machine - methylPREDNISolone sodium succinate (SOLU-MEDROL) 125 mg/2 mL injection 125 mg - PR PRESSURIZED/NONPRESSURIZED INHALATION TREATMENT  -wheezing improved some after neb tx in office, NAD   Meds ordered this encounter  Medications   azithromycin (ZITHROMAX Z-PAK) 250 MG tablet    Sig: Take 2 tablets (500 mg) PO today, then 1 tablet (250 mg) PO daily x4 days.    Dispense:  6 tablet    Refill:  0   DISCONTD: ipratropium-albuterol (DUONEB) 0.5-2.5 (3) MG/3ML SOLN    Sig: Take 3 mLs by nebulization every 4 (four) hours as needed.    Dispense:  360 mL    Refill:  1   predniSONE (DELTASONE) 20 MG tablet    Sig: Take 2 tablets (40 mg total) by mouth daily for 5 days.    Dispense:  10 tablet    Refill:  0   DISCONTD: methylPREDNISolone sodium succinate (SOLU-MEDROL) 125 mg/2 mL injection 125 mg   HYDROcodone bit-homatropine (HYCODAN) 5-1.5 MG/5ML syrup    Sig: Take 5 mLs by mouth every 8 (eight) hours as needed for cough.    Dispense:  120 mL    Refill:  0   methylPREDNISolone acetate (DEPO-MEDROL) injection 80 mg   ipratropium-albuterol (DUONEB) 0.5-2.5 (3) MG/3ML nebulizer solution 3 mL    Return if symptoms worsen or fail to improve.  Moshe Cipro, FNP

## 2023-06-21 ENCOUNTER — Telehealth: Payer: Self-pay

## 2023-06-21 NOTE — Telephone Encounter (Signed)
Will you?

## 2023-06-21 NOTE — Telephone Encounter (Signed)
 Copied from CRM [id number]. Topic: General - Other >> Jun 21, 2023  9:30 AM Burnard DEL wrote: Reason for CRM: patient would like to know if Dr Joshua would accept his mom as a patient of his . He has had to relocate her her to [place] from [place]  after having a stroke 2 weeks ago,and he is looking to have her established with someone. Patient has been a patient of Dr Joshua for years

## 2023-06-23 NOTE — Telephone Encounter (Signed)
 Spoke with patient, he is aware Dr.Jones is unfortunately not accepting new patients at this time.

## 2023-07-20 ENCOUNTER — Ambulatory Visit: Payer: Self-pay | Admitting: Allergy and Immunology

## 2023-07-20 DIAGNOSIS — J309 Allergic rhinitis, unspecified: Secondary | ICD-10-CM

## 2023-07-25 ENCOUNTER — Other Ambulatory Visit: Payer: Self-pay | Admitting: Internal Medicine

## 2023-07-25 ENCOUNTER — Other Ambulatory Visit: Payer: Self-pay | Admitting: Family Medicine

## 2023-07-25 DIAGNOSIS — N410 Acute prostatitis: Secondary | ICD-10-CM

## 2023-07-25 DIAGNOSIS — E785 Hyperlipidemia, unspecified: Secondary | ICD-10-CM

## 2023-07-25 DIAGNOSIS — I2510 Atherosclerotic heart disease of native coronary artery without angina pectoris: Secondary | ICD-10-CM

## 2023-07-26 ENCOUNTER — Ambulatory Visit (INDEPENDENT_AMBULATORY_CARE_PROVIDER_SITE_OTHER): Payer: No Typology Code available for payment source | Admitting: Internal Medicine

## 2023-07-26 ENCOUNTER — Encounter: Payer: Self-pay | Admitting: Internal Medicine

## 2023-07-26 VITALS — BP 120/70 | HR 101 | Ht 75.0 in | Wt 260.0 lb

## 2023-07-26 DIAGNOSIS — E785 Hyperlipidemia, unspecified: Secondary | ICD-10-CM

## 2023-07-26 DIAGNOSIS — Z7984 Long term (current) use of oral hypoglycemic drugs: Secondary | ICD-10-CM | POA: Diagnosis not present

## 2023-07-26 DIAGNOSIS — Z7985 Long-term (current) use of injectable non-insulin antidiabetic drugs: Secondary | ICD-10-CM

## 2023-07-26 DIAGNOSIS — E1159 Type 2 diabetes mellitus with other circulatory complications: Secondary | ICD-10-CM

## 2023-07-26 LAB — POCT GLYCOSYLATED HEMOGLOBIN (HGB A1C): Hemoglobin A1C: 6.9 % — AB (ref 4.0–5.6)

## 2023-07-26 MED ORDER — GLUCOSE BLOOD VI STRP: ORAL_STRIP | 3 refills | Status: DC

## 2023-07-26 MED ORDER — ONETOUCH VERIO REFLECT W/DEVICE KIT: PACK | 0 refills | Status: DC

## 2023-07-26 MED ORDER — ONETOUCH DELICA LANCETS 33G MISC: 1 refills | Status: DC

## 2023-07-26 NOTE — Patient Instructions (Signed)
 Please continue: - Rybelsus 7 mg before b'fast - Synjardy 1000-25 mg in am  Please return in 4-6 months.

## 2023-07-26 NOTE — Progress Notes (Signed)
 Patient ID: [name], male   DOB: [birth year], 53 y.o.   MRN: [id number]  HPI: [name] is a 53 y.o.-year-old male, returning for f/u for DM2, dx in 2008, non-insulin-dependent, uncontrolled, with complications (CAD, h/o TIA, PN, ED). He prev. Saw Dr. Everardo All.  Last visit with me 6 months ago.  Interim history: No increased urination, blurry vision, nausea, chest pain. He does mention dietary indiscretions during the holidays, but he improved his diet afterwards.  Reviewed HbA1c: Lab Results  Component Value Date   HGBA1C 6.7 (A) 01/19/2023   HGBA1C 6.4 (A) 07/07/2022   HGBA1C 6.7 (A) 03/02/2022   HGBA1C 7.2 (A) 10/21/2021   HGBA1C 7.4 (A) 07/22/2021   HGBA1C 7.6 (A) 04/22/2021   HGBA1C 7.4 (A) 02/18/2021   HGBA1C 7.9 (H) 12/05/2020   HGBA1C 7.4 (A) 09/30/2020   HGBA1C 7.5 (A) 01/10/2020  In Dr. Annitta Jersey office: 12/2021: HbA1c <7% reportedly  He is on: - Metformin ER 1000 mg 2x a day >> Synjardy (Empagliflozin-Metformin) ER 25-1000 mg in am - Rybelsus (started 07/2021): 7 mg daily in am He had weight gain with Actos. He had nausea with Repaglinide. Stopped Nateglinide per cardiology.  He has a h/o gall bladder- related pancreatitis 2019.  Pt checks his sugars >4x a day with his freestyle libre CGM:  Prev.:  Previously:  Lowest sugar was 54 >> 67 >> 60s (delayed meals); ? hypoglycemia awareness.  Highest sugar was 290 >> 200 >> 190>> 250 (Holidays).  Glucometer: One Touch Verio  - no CKD, last BUN/creatinine:  Lab Results  Component Value Date   BUN 13 08/14/2022   BUN 12 04/16/2022   CREATININE 0.77 08/14/2022   CREATININE 0.82 04/16/2022   Lab Results  Component Value Date   MICRALBCREAT 5.7 04/16/2022   MICRALBCREAT 4.9 12/05/2020   MICRALBCREAT 0.9 08/29/2019   MICRALBCREAT 2.2 06/14/2018   MICRALBCREAT 1.6 06/29/2017   MICRALBCREAT 1.3 04/28/2016   MICRALBCREAT 1.0 04/29/2015   MICRALBCREAT 1.3 11/22/2013   MICRALBCREAT 2.7 07/09/2011    MICRALBCREAT 24.5 10/07/2009  On Entresto.  - + HL; last set of lipids: Lab Results  Component Value Date   CHOL 112 04/16/2022   HDL 41.30 04/16/2022   LDLCALC 60 04/16/2022   TRIG 53.0 04/16/2022   CHOLHDL 3 04/16/2022  On Crestor 40 mg daily.  - last eye exam was in 11/03/2022. No DR.   - no numbness and tingling in his feet. Last foot exam: 01/19/2023.  He has a h/o: HTN, gout, pulm. Sarcoidosis, COPD stage IV - He is not smoking now but did smoke and stopped in 2010, seizures, GERD. Also, recurring pericarditis. At our visit from 06/2022, he had prostatitis with increased urination/nocturia and lower abdominal pressure - Urologist: Dr. Benancio Deeds.  ROS: + see HPI  Past Medical History:  Diagnosis Date   Anginal pain (HCC)    Arthritis    ? 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 Followed by Dr. Nickola Major   Arthritis    "ankles" (04/13/2016)   Asthma    CORONARY ARTERY DISEASE    a. cath 05/2014: mild LAD disease, normal LCx and RCA. Risk factor modification recommended.   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)    MSLT 01/09/09 & MRI brain 01/09/09   Pericarditis  recurrent   POLYNEUROPATHY    PULMONARY SARCOIDOSIS    Mediastinal lymphadenopathy with biospy proven sarcodosis   Seizures (HCC)    "none in 4-5 years; don't know what kind; not related to alcohol" (04/13/2016)   TESTICULAR HYPOFUNCTION    TIA (transient ischemic attack)    "I don't remember when" (04/13/2016)   Past Surgical History:  Procedure Laterality Date   BRONCHOSCOPY  08/21/08   CARDIAC CATHETERIZATION  06/25/2009   minimal disease   CARDIAC CATHETERIZATION N/A 04/14/2016   Procedure: Left Heart Cath and Coronary Angiography;  Surgeon: Kathleene Hazel, MD;  Location: [facility] INVASIVE CV LAB;  Service: Cardiovascular;  Laterality: N/A;    CHOLECYSTECTOMY N/A 09/21/2017   Procedure: LAPAROSCOPIC CHOLECYSTECTOMY WITH INTRAOPERATIVE CHOLANGIOGRAM;  Surgeon: Glenna Fellows, MD;  Location: [facility] ORS;  Service: General;  Laterality: N/A;   LEFT HEART CATHETERIZATION WITH CORONARY ANGIOGRAM N/A 05/18/2014   Procedure: LEFT HEART CATHETERIZATION WITH CORONARY ANGIOGRAM;  Surgeon: Corky Crafts, MD;  Location: [facility] CATH LAB;  Service: Cardiovascular;  Laterality: N/A;   MEDIASTINOSCOPY  11/30/08   Social History   Socioeconomic History   Marital status: Significant Other    Spouse name: Not on file   Number of children: 3   Years of education: Not on file   Highest education level: Not on file  Occupational History   Occupation: Unemployed  Tobacco Use   Smoking status: Former    Current packs/day: 0.00    Average packs/day: 1 pack/day for 10.0 years (10.0 ttl pk-yrs)    Types: Cigarettes    Start date: 05/19/1999    Quit date: 05/18/2009    Years since quitting: 14.1    Passive exposure: Past   Smokeless tobacco: Never  Vaping Use   Vaping status: Never Used  Substance and Sexual Activity   Alcohol use: Yes    Comment: rare   Drug use: No   Sexual activity: Yes  Other Topics Concern   Not on file  Social History Narrative   Married, lives with wife and 3 kids.    Currently student. 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 [employer]   Social Drivers of [employer]: Low Risk  (06/23/2022)   Overall Financial Resource Strain (CARDIA)    Difficulty of Paying Living Expenses: Not hard at all  Food Insecurity: No Food Insecurity (06/23/2022)   Hunger Vital Sign    Worried About Running Out of Food in the Last Year: Never true    Ran Out of Food in the Last Year: Never true  Transportation Needs: No Transportation Needs (06/23/2022)   PRAPARE - Administrator, Civil Service (Medical): No    Lack of Transportation (Non-Medical): No  Physical Activity: Sufficiently Active (06/23/2022)    Exercise Vital Sign    Days of Exercise per Week: 7 days    Minutes of Exercise per Session: 30 min  Stress: Stress Concern Present (06/23/2022)   [employer] of Occupational Health - Occupational Stress Questionnaire    Feeling of Stress : To some extent  Social Connections: Socially Integrated (06/23/2022)   Social Connection and Isolation Panel [NHANES]    Frequency of Communication with Friends and Family: More than three times a week    Frequency of Social Gatherings with Friends and Family: More than three times a week    Attends Religious Services: More than 4 times per year    Active Member of Clubs or Organizations: No  Attends Banker Meetings: More than 4 times per year    Marital Status: Living with partner  Intimate Partner Violence: Not At Risk (06/23/2022)   Humiliation, Afraid, Rape, and Kick questionnaire    Fear of Current or Ex-Partner: No    Emotionally Abused: No    Physically Abused: No    Sexually Abused: No   Current Outpatient Medications on File Prior to Visit  Medication Sig Dispense Refill   Albuterol-Budesonide (AIRSUPRA) 90-80 MCG/ACT AERO Inhale 2 puffs into the lungs every 4 (four) hours as needed. 10.7 g 1   allopurinol (ZYLOPRIM) 300 MG tablet TAKE 1 TABLET(300 MG) BY MOUTH DAILY 90 tablet 1   aspirin EC 81 MG tablet Take 1 tablet (81 mg total) by mouth daily. 30 tablet 3   azithromycin (ZITHROMAX Z-PAK) 250 MG tablet Take 2 tablets (500 mg) PO today, then 1 tablet (250 mg) PO daily x4 days. 6 tablet 0   Budeson-Glycopyrrol-Formoterol (BREZTRI AEROSPHERE) 160-9-4.8 MCG/ACT AERO Inhale 2 puffs into the lungs in the morning and at bedtime.     cetirizine (ZYRTEC) 10 MG tablet Take 10 mg by mouth daily.     cholecalciferol (VITAMIN D) 1000 units tablet Take 1,000 Units by mouth daily.     clomiPHENE (CLOMID) 50 MG tablet Take 50 mg by mouth every other day.     colchicine 0.6 MG tablet Take 1 tablet (0.6 mg total) by mouth 2 (two)  times daily. 180 tablet 0   Continuous Glucose Sensor (FREESTYLE LIBRE 3 SENSOR) MISC 1 EACH BY DOES NOT APPLY ROUTE EVERY 14 (FOURTEEN) DAYS. 6 each 3   docusate sodium (COLACE) 100 MG capsule Take 100 mg by mouth daily.     Empagliflozin-metFORMIN HCl ER (SYNJARDY XR) 25-1000 MG TB24 Take 1 tablet by mouth daily. 90 tablet 3   fluticasone (FLONASE) 50 MCG/ACT nasal spray Place 2 sprays into both nostrils daily. 16 g 6   folic acid (FOLVITE) 1 MG tablet Take 1 mg by mouth daily. (Patient not taking: Reported on 05/25/2023)  1   glucose blood (ONETOUCH VERIO) test strip 1 each by Other route daily. And lancets 1/day (Patient taking differently: 1 each by Other route daily.) 100 each 11   HYDROcodone bit-homatropine (HYCODAN) 5-1.5 MG/5ML syrup Take 5 mLs by mouth every 8 (eight) hours as needed for cough. 120 mL 0   ipratropium-albuterol (DUONEB) 0.5-2.5 (3) MG/3ML SOLN Take 3 mLs by nebulization every 4 (four) hours as needed. 360 mL 1   metoprolol succinate (TOPROL-XL) 50 MG 24 hr tablet Take 50 mg by mouth daily. Take with or immediately following a meal.     nitroGLYCERIN (NITROSTAT) 0.4 MG SL tablet Place 1 tablet (0.4 mg total) under the tongue every 5 (five) minutes as needed for chest pain. 25 tablet 3   pantoprazole (PROTONIX) 40 MG tablet TAKE 2 TABLETS(80 MG) BY MOUTH DAILY 180 tablet 0   rosuvastatin (CRESTOR) 40 MG tablet TAKE 1 TABLET(40 MG) BY MOUTH DAILY 90 tablet 0   sacubitril-valsartan (ENTRESTO) 49-51 MG Take 1 tablet by mouth 2 (two) times daily. Via NOVARTIS pt assistance     saw palmetto 500 MG capsule Take 500 mg by mouth daily.     Semaglutide (RYBELSUS) 7 MG TABS Take 1 tablet (7 mg total) by mouth daily. 90 tablet 3   tadalafil (CIALIS) 20 MG tablet TAKE ONE TABLET BY MOUTH DAILY AS NEEDED 20 tablet 11   Zinc Sulfate (ZINC 15 PO) Take 1 tablet by  mouth daily.     No current facility-administered medications on file prior to visit.   Allergies  Allergen Reactions    Amlodipine Other (See Comments)    Ankle swelling with 10mg , ok with 5mg  dose   Sulfamethoxazole Rash    fever   Sulfonamide Derivatives Rash and Other (See Comments)   Family History  Problem Relation Age of Onset   Diabetes Mother    Hypertension Mother    Colon polyps Mother    Diabetes Father    Heart disease Father 77       Fatal MI   Heart failure Father    Colon polyps Father    Pancreatic cancer Maternal Grandmother    Stomach cancer Maternal Uncle    Pancreatic cancer Maternal Uncle    Colon cancer Neg Hx    Esophageal cancer Neg Hx    Rectal cancer Neg Hx    Liver disease Neg Hx    PE: BP 120/70   Pulse (!) 101   Ht 6\' 3"  (1.905 m)   Wt 260 lb (117.9 kg)   SpO2 98%   BMI 32.50 kg/m  Wt Readings from Last 3 Encounters:  07/26/23 260 lb (117.9 kg)  05/25/23 246 lb 6.4 oz (111.8 kg)  04/23/23 262 lb (118.8 kg)   Constitutional: overweight, in NAD Eyes: EOMI, no exophthalmos ENT: no thyromegaly, no cervical lymphadenopathy Cardiovascular: Tachycardia, RR, No MRG Respiratory: CTA B Musculoskeletal: no deformities Skin: no rashes Neurological: no tremor with outstretched hands  ASSESSMENT: 1. DM2, non-insulin-dependent, uncontrolled, with complications - CAD - h/o TIA - PN - ED  2. HL  PLAN:  1. Patient with longstanding, previously uncontrolled type 2 diabetes, on oral antidiabetic regimen with GLP-1 receptor agonist and also metformin/SGLT2 inhibitor combination, with an HbA1c of 6.7% at last visit, slightly higher.  At that time, sugars were well-controlled, fluctuating within the target range with very rare occasional mildly hyperglycemic spikes especially after breakfast.  The predicted HbA1c from his sensors GMI was 6.2%.  We did not change his regimen.  I refilled his prescriptions. CGM interpretation: -At today's visit, we reviewed his CGM downloads: It appears that 98% of values are in target range (goal >70%), while 2% are higher than 180 (goal  <25%), and 0% are lower than 70 (goal <4%).  The calculated average blood sugar is 128.  The projected HbA1c for the next 3 months (GMI) is 6.4%. -Reviewing the CGM trends, sugars appear to be at goal almost entirely, fluctuating within the target range.  Upon questioning, he had more dietary indiscretions during the holidays and this may be the reason why the HbA1c is actually up at today's visit (see below).  However, judging by the last 2 weeks, his regimen appears to be adequate and if he continues to control his diet, no changes are needed in his regimen. -At today's visit, upon questioning, he does not have a meter at home so I called in a One Touch Verio reflect glucometer with supplies.  We discussed that he does need to have a glucometer despite the fact that he has a CGM.  We discussed in which situations he may need to use it. - I suggested to:  Patient Instructions  Please continue: - Rybelsus 7 mg before b'fast - Synjardy 1000-25 mg in am  Please return in 4-6 months.  - we checked his HbA1c: 6.9% (slightly higher) - advised to check sugars at different times of the day - 4x a day, rotating check  times - advised for yearly eye exams >> he is UTD - he is due for annual labs but he has an appointment with PCP next month and would like to wait until then to have them checked - return to clinic in 4-6 months  2.  HL -Reviewing his lipid panel from 04/2022: Fractions are at goal, with the exception of a slightly elevated LDL, above 55, which is our target due to history of cardiovascular disease: Lab Results  Component Value Date   CHOL 112 04/16/2022   HDL 41.30 04/16/2022   LDLCALC 60 04/16/2022   TRIG 53.0 04/16/2022   CHOLHDL 3 04/16/2022  -He continues on Crestor 40 mg daily without side effects  Carlus Pavlov, MD PhD [facility] Endocrinology

## 2023-08-02 NOTE — Progress Notes (Unsigned)
 Virtual Visit via Video Note  I connected with [name] on 08/03/23 at  1:40 PM EST by a video enabled telemedicine application and verified that I am speaking with the correct person using two identifiers.  Patient Location: Home Provider Location: Office/Clinic  I discussed the limitations, risks, security, and privacy concerns of performing an evaluation and management service by video and the availability of in person appointments. I also discussed with the patient that there may be a patient responsible charge related to this service. The patient expressed understanding and agreed to proceed.  Subjective: PCP: [name], MD  Chief Complaint  Patient presents with   Acute Visit    Eye pressure, throat drainage, neck stiffness, nasal congestion, dry cough, SOB. Ongoing for about 1 week, has tried Mucinex Sinus and Zyrtec   HPI  53 year old male presents for A/V visit for evaluation of nasal congestion, sinus pressure, coughing and wheezing for the last week. States that he is out of his nebulizer solution, requesting refill.  States that symptoms do improve with this. Has been also taking Mucinex sinus and Zyrtec. Requesting refill of Airsupra inhaler, Flonase as well today. Hx asthma. Denies abdominal pain, nausea, vomiting, diarrhea, rash, fever, other symptoms. Denies known sick contacts. Denies other concerns today  ROS: Per HPI  Current Outpatient Medications:    allopurinol (ZYLOPRIM) 300 MG tablet, TAKE 1 TABLET(300 MG) BY MOUTH DAILY, Disp: 90 tablet, Rfl: 1   aspirin EC 81 MG tablet, Take 1 tablet (81 mg total) by mouth daily., Disp: 30 tablet, Rfl: 3   azithromycin (ZITHROMAX) 250 MG tablet, Take 2 tablets on day 1, then 1 tablet daily on days 2 through 5, Disp: 6 tablet, Rfl: 0   Blood Glucose Monitoring Suppl (ONETOUCH VERIO REFLECT) w/Device KIT, Use as advised, Disp: 1 kit, Rfl: 0   cetirizine (ZYRTEC) 10 MG tablet, Take 10 mg by mouth daily.,  Disp: , Rfl:    cholecalciferol (VITAMIN D) 1000 units tablet, Take 1,000 Units by mouth daily., Disp: , Rfl:    clomiPHENE (CLOMID) 50 MG tablet, Take 50 mg by mouth every other day., Disp: , Rfl:    colchicine 0.6 MG tablet, Take 1 tablet (0.6 mg total) by mouth 2 (two) times daily., Disp: 180 tablet, Rfl: 0   Continuous Glucose Sensor (FREESTYLE LIBRE 3 SENSOR) MISC, 1 EACH BY DOES NOT APPLY ROUTE EVERY 14 (FOURTEEN) DAYS., Disp: 6 each, Rfl: 3   docusate sodium (COLACE) 100 MG capsule, Take 100 mg by mouth daily., Disp: , Rfl:    Empagliflozin-metFORMIN HCl ER (SYNJARDY XR) 25-1000 MG TB24, Take 1 tablet by mouth daily., Disp: 90 tablet, Rfl: 3   folic acid (FOLVITE) 1 MG tablet, Take 1 mg by mouth daily., Disp: , Rfl: 1   glucose blood test strip, Use as instructed 1x a day, Disp: 100 each, Rfl: 3   HYDROcodone bit-homatropine (HYCODAN) 5-1.5 MG/5ML syrup, Take 5 mLs by mouth every 8 (eight) hours as needed for cough., Disp: 120 mL, Rfl: 0   metoprolol succinate (TOPROL-XL) 50 MG 24 hr tablet, Take 50 mg by mouth daily. Take with or immediately following a meal., Disp: , Rfl:    nitroGLYCERIN (NITROSTAT) 0.4 MG SL tablet, Place 1 tablet (0.4 mg total) under the tongue every 5 (five) minutes as needed for chest pain., Disp: 25 tablet, Rfl: 3   OneTouch Delica Lancets 33G MISC, Use 1x a day, Disp: 100 each, Rfl: 1   pantoprazole (PROTONIX) 40 MG  tablet, TAKE 2 TABLETS(80 MG) BY MOUTH DAILY, Disp: 180 tablet, Rfl: 0   predniSONE (DELTASONE) 20 MG tablet, Take 2 tablets (40 mg total) by mouth daily for 5 days., Disp: 10 tablet, Rfl: 0   rosuvastatin (CRESTOR) 40 MG tablet, TAKE 1 TAB BY MOUTH DAILY, Disp: 90 tablet, Rfl: 1   sacubitril-valsartan (ENTRESTO) 49-51 MG, Take 1 tablet by mouth 2 (two) times daily. Via NOVARTIS pt assistance, Disp: , Rfl:    saw palmetto 500 MG capsule, Take 500 mg by mouth daily., Disp: , Rfl:    Semaglutide (RYBELSUS) 7 MG TABS, Take 1 tablet (7 mg total) by mouth  daily., Disp: 90 tablet, Rfl: 3   tadalafil (CIALIS) 20 MG tablet, TAKE ONE TABLET BY MOUTH DAILY AS NEEDED, Disp: 20 tablet, Rfl: 11   Zinc Sulfate (ZINC 15 PO), Take 1 tablet by mouth daily., Disp: , Rfl:    Albuterol-Budesonide (AIRSUPRA) 90-80 MCG/ACT AERO, Inhale 2 puffs into the lungs every 4 (four) hours as needed., Disp: 10.7 g, Rfl: 1   fluticasone (FLONASE) 50 MCG/ACT nasal spray, Place 2 sprays into both nostrils daily., Disp: 16 g, Rfl: 6   ipratropium-albuterol (DUONEB) 0.5-2.5 (3) MG/3ML SOLN, Take 3 mLs by nebulization every 4 (four) hours as needed., Disp: 360 mL, Rfl: 1  Observations/Objective: There were no vitals filed for this visit. Physical Exam Vitals and nursing note reviewed.  Constitutional:      General: He is not in acute distress. HENT:     Head: Normocephalic and atraumatic.  Eyes:     Extraocular Movements: Extraocular movements intact.  Pulmonary:     Effort: Pulmonary effort is normal.  Musculoskeletal:     Cervical back: Normal range of motion.  Neurological:     General: No focal deficit present.     Mental Status: He is alert and oriented to person, place, and time.  Psychiatric:        Mood and Affect: Mood normal.        Behavior: Behavior normal.    Assessment and Plan: Bacterial URI -     Azithromycin; Take 2 tablets on day 1, then 1 tablet daily on days 2 through 5  Dispense: 6 tablet; Refill: 0  Nasal congestion -     Fluticasone Propionate; Place 2 sprays into both nostrils daily.  Dispense: 16 g; Refill: 6  Wheezing -     Ipratropium-Albuterol; Take 3 mLs by nebulization every 4 (four) hours as needed.  Dispense: 360 mL; Refill: 1 -     Airsupra; Inhale 2 puffs into the lungs every 4 (four) hours as needed.  Dispense: 10.7 g; Refill: 1 -     predniSONE; Take 2 tablets (40 mg total) by mouth daily for 5 days.  Dispense: 10 tablet; Refill: 0  Acute cough -     Ipratropium-Albuterol; Take 3 mLs by nebulization every 4 (four) hours as  needed.  Dispense: 360 mL; Refill: 1 -     Airsupra; Inhale 2 puffs into the lungs every 4 (four) hours as needed.  Dispense: 10.7 g; Refill: 1 -     predniSONE; Take 2 tablets (40 mg total) by mouth daily for 5 days.  Dispense: 10 tablet; Refill: 0    Follow Up Instructions: Return if symptoms worsen or fail to improve.   I discussed the assessment and treatment plan with the patient. The patient was provided an opportunity to ask questions, and all were answered. The patient agreed with the plan and demonstrated  an understanding of the instructions.   The patient was advised to call back or seek an in-person evaluation if the symptoms worsen or if the condition fails to improve as anticipated.  The above assessment and management plan was discussed with the patient. The patient verbalized understanding of and has agreed to the management plan.   12 mins spent on AV encounter  Moshe Cipro, FNP

## 2023-08-03 ENCOUNTER — Telehealth (INDEPENDENT_AMBULATORY_CARE_PROVIDER_SITE_OTHER): Payer: No Typology Code available for payment source | Admitting: Family Medicine

## 2023-08-03 ENCOUNTER — Encounter: Payer: Self-pay | Admitting: Family Medicine

## 2023-08-03 DIAGNOSIS — H9203 Otalgia, bilateral: Secondary | ICD-10-CM

## 2023-08-03 DIAGNOSIS — J069 Acute upper respiratory infection, unspecified: Secondary | ICD-10-CM

## 2023-08-03 DIAGNOSIS — B9689 Other specified bacterial agents as the cause of diseases classified elsewhere: Secondary | ICD-10-CM

## 2023-08-03 DIAGNOSIS — R051 Acute cough: Secondary | ICD-10-CM | POA: Diagnosis not present

## 2023-08-03 DIAGNOSIS — R062 Wheezing: Secondary | ICD-10-CM

## 2023-08-03 DIAGNOSIS — R0981 Nasal congestion: Secondary | ICD-10-CM | POA: Diagnosis not present

## 2023-08-03 MED ORDER — FLUTICASONE PROPIONATE 50 MCG/ACT NA SUSP: 2.0000 | Freq: Every day | NASAL | 6 refills | Status: DC

## 2023-08-03 MED ORDER — PREDNISONE 20 MG PO TABS: 40.0000 mg | ORAL_TABLET | Freq: Every day | ORAL | 0 refills | Status: AC

## 2023-08-03 MED ORDER — AZITHROMYCIN 250 MG PO TABS: ORAL_TABLET | ORAL | 0 refills | Status: AC

## 2023-08-03 MED ORDER — IPRATROPIUM-ALBUTEROL 0.5-2.5 (3) MG/3ML IN SOLN: 3.0000 mL | RESPIRATORY_TRACT | 1 refills | Status: AC | PRN

## 2023-08-03 MED ORDER — AIRSUPRA 90-80 MCG/ACT IN AERO: 2.0000 | INHALATION_SPRAY | RESPIRATORY_TRACT | 1 refills | Status: AC | PRN

## 2023-08-03 NOTE — Patient Instructions (Signed)
 I have sent in flonase for you to use 2 sprays per nostril once daily.   I have sent in azithromycin for you to take.  Take 2 tablets today, then 1 tablet daily for the next 4 days.  I have sent in solution for you to use in your nebulizer.  I would recommend using this about every 4 hours for the next 2 days just to help keep you open and keep air moving well throughout your lungs.  I have sent in an airsupra inhaler for you to use 2 puffs every 4 hours as needed for wheezing.  I have sent in prednisone for you to take 2 tablets once daily in the morning with breakfast for the next 5 days.  Follow-up with me for new or worsening symptoms.

## 2023-09-06 LAB — PSA: PSA: 1

## 2023-10-05 ENCOUNTER — Other Ambulatory Visit (HOSPITAL_COMMUNITY): Payer: Self-pay

## 2023-10-05 ENCOUNTER — Telehealth: Payer: Self-pay | Admitting: Pharmacy Technician

## 2023-10-05 ENCOUNTER — Other Ambulatory Visit: Payer: Self-pay | Admitting: Internal Medicine

## 2023-10-05 NOTE — Telephone Encounter (Signed)
 Pharmacy Patient Advocate Encounter  Received notification from [facility] that Prior Authorization for FreeStyle Libre 3 Plus Sensor has been DENIED.  Full denial letter will be uploaded to the media tab. See denial reason below.  **Dexcom is preferred, unless there is clinical rationale that I can submit stating why he has to use [employer].**     PA #/Case ID/Reference #: [id number]

## 2023-10-05 NOTE — Telephone Encounter (Signed)
 Pharmacy Patient Advocate Encounter   Received notification from CoverMyMeds that prior authorization for FreeStyle Libre 3 Plus Sensor is required/requested.   Insurance verification completed.   The patient is insured through [facility] .   Per test claim: PA required; PA submitted to above mentioned insurance via CoverMyMeds Key/confirmation #/EOC [id number] Status is pending

## 2023-10-06 MED ORDER — DEXCOM G7 SENSOR MISC: 3 refills | Status: DC

## 2023-10-06 NOTE — Telephone Encounter (Signed)
 Requested Prescriptions   Signed Prescriptions Disp Refills   Continuous Glucose Sensor (DEXCOM G7 SENSOR) MISC 9 each 3    Sig: Use to check glucose continuously, change sensor every 10 days    Authorizing Provider: Carlus Pavlov    Ordering User: Pollie Meyer

## 2023-10-12 ENCOUNTER — Ambulatory Visit (INDEPENDENT_AMBULATORY_CARE_PROVIDER_SITE_OTHER): Admitting: Internal Medicine

## 2023-10-12 ENCOUNTER — Encounter: Payer: Self-pay | Admitting: Internal Medicine

## 2023-10-12 VITALS — BP 132/82 | HR 80 | Temp 97.9°F | Resp 16 | Ht 75.0 in | Wt 261.0 lb

## 2023-10-12 DIAGNOSIS — E1169 Type 2 diabetes mellitus with other specified complication: Secondary | ICD-10-CM

## 2023-10-12 DIAGNOSIS — Z0001 Encounter for general adult medical examination with abnormal findings: Secondary | ICD-10-CM

## 2023-10-12 DIAGNOSIS — Z0000 Encounter for general adult medical examination without abnormal findings: Secondary | ICD-10-CM | POA: Diagnosis not present

## 2023-10-12 DIAGNOSIS — E785 Hyperlipidemia, unspecified: Secondary | ICD-10-CM | POA: Diagnosis not present

## 2023-10-12 DIAGNOSIS — D869 Sarcoidosis, unspecified: Secondary | ICD-10-CM | POA: Diagnosis not present

## 2023-10-12 DIAGNOSIS — Z23 Encounter for immunization: Secondary | ICD-10-CM | POA: Diagnosis not present

## 2023-10-12 DIAGNOSIS — K760 Fatty (change of) liver, not elsewhere classified: Secondary | ICD-10-CM | POA: Diagnosis not present

## 2023-10-12 DIAGNOSIS — E669 Obesity, unspecified: Secondary | ICD-10-CM | POA: Diagnosis not present

## 2023-10-12 DIAGNOSIS — I10 Essential (primary) hypertension: Secondary | ICD-10-CM

## 2023-10-12 DIAGNOSIS — M1000 Idiopathic gout, unspecified site: Secondary | ICD-10-CM | POA: Diagnosis not present

## 2023-10-12 DIAGNOSIS — Z6832 Body mass index (BMI) 32.0-32.9, adult: Secondary | ICD-10-CM

## 2023-10-12 LAB — BASIC METABOLIC PANEL WITH GFR
BUN: 12 mg/dL (ref 6–23)
CO2: 27 meq/L (ref 19–32)
Calcium: 9.4 mg/dL (ref 8.4–10.5)
Chloride: 100 meq/L (ref 96–112)
Creatinine, Ser: 0.76 mg/dL (ref 0.40–1.50)
GFR: 104.56 mL/min (ref >60.00)
Glucose, Bld: 101 mg/dL — ABNORMAL HIGH (ref 70–99)
Potassium: 3.5 meq/L (ref 3.5–5.1)
Sodium: 138 meq/L (ref 135–145)

## 2023-10-12 LAB — URINALYSIS, ROUTINE W REFLEX MICROSCOPIC
Bilirubin Urine: NEGATIVE
Hgb urine dipstick: NEGATIVE
Leukocytes,Ua: NEGATIVE
Nitrite: NEGATIVE
Specific Gravity, Urine: 1.015 (ref 1.000–1.030)
Total Protein, Urine: NEGATIVE
Urine Glucose: >=1000 — AB
Urobilinogen, UA: 0.2 (ref 0.0–1.0)
pH: 6 (ref 5.0–8.0)

## 2023-10-12 LAB — HEPATIC FUNCTION PANEL
ALT: 12 U/L (ref 0–53)
AST: 16 U/L (ref 0–37)
Albumin: 4.5 g/dL (ref 3.5–5.2)
Alkaline Phosphatase: 67 U/L (ref 39–117)
Bilirubin, Direct: 0.1 mg/dL (ref 0.0–0.3)
Total Bilirubin: 0.4 mg/dL (ref 0.2–1.2)
Total Protein: 7.4 g/dL (ref 6.0–8.3)

## 2023-10-12 LAB — PROTIME-INR
INR: 1.1 ratio — ABNORMAL HIGH (ref 0.8–1.0)
Prothrombin Time: 11.2 s (ref 9.6–13.1)

## 2023-10-12 LAB — TSH: TSH: 1.01 u[IU]/mL (ref 0.35–5.50)

## 2023-10-12 LAB — CBC WITH DIFFERENTIAL/PLATELET
Basophils Absolute: 0 10*3/uL (ref 0.0–0.1)
Basophils Relative: 0.7 % (ref 0.0–3.0)
Eosinophils Absolute: 0.2 10*3/uL (ref 0.0–0.7)
Eosinophils Relative: 3.9 % (ref 0.0–5.0)
HCT: 44.2 % (ref 39.0–52.0)
Hemoglobin: 15 g/dL (ref 13.0–17.0)
Lymphocytes Relative: 44.9 % (ref 12.0–46.0)
Lymphs Abs: 2.4 10*3/uL (ref 0.7–4.0)
MCHC: 34 g/dL (ref 30.0–36.0)
MCV: 82.4 fl (ref 78.0–100.0)
Monocytes Absolute: 0.6 10*3/uL (ref 0.1–1.0)
Monocytes Relative: 10.9 % (ref 3.0–12.0)
Neutro Abs: 2.2 10*3/uL (ref 1.4–7.7)
Neutrophils Relative %: 39.6 % — ABNORMAL LOW (ref 43.0–77.0)
Platelets: 236 10*3/uL (ref 150.0–400.0)
RBC: 5.36 Mil/uL (ref 4.22–5.81)
RDW: 14.2 % (ref 11.5–15.5)
WBC: 5.5 10*3/uL (ref 4.0–10.5)

## 2023-10-12 LAB — LIPID PANEL
Cholesterol: 118 mg/dL (ref 0–200)
HDL: 47.8 mg/dL (ref >39.00)
LDL Cholesterol: 49 mg/dL (ref 0–99)
NonHDL: 70.46
Total CHOL/HDL Ratio: 2
Triglycerides: 106 mg/dL (ref 0.0–149.0)
VLDL: 21.2 mg/dL (ref 0.0–40.0)

## 2023-10-12 LAB — MICROALBUMIN / CREATININE URINE RATIO
Creatinine,U: 97.2 mg/dL
Microalb Creat Ratio: 25 mg/g (ref 0.0–30.0)
Microalb, Ur: 2.4 mg/dL — ABNORMAL HIGH (ref 0.0–1.9)

## 2023-10-12 LAB — URIC ACID: Uric Acid, Serum: 4.3 mg/dL (ref 4.0–7.8)

## 2023-10-12 NOTE — Progress Notes (Unsigned)
 Subjective:  Patient ID: [name], male    DOB: 10-04-1970  Age: 53 y.o. MRN: [id number]  CC: Annual Exam, Hypertension, Hyperlipidemia, Coronary Artery Disease, and Diabetes   HPI [name] presents for a CPX and f/up ----  Discussed the use of AI scribe software for clinical note transcription with the patient, who gave verbal consent to proceed.  History of Present Illness   [name] is a 53 year old male who presents for an annual physical exam.  No symptoms of high blood pressure, such as headache or blurred vision. No chest pain, shortness of breath, dizziness, or lightheadedness. No symptoms related to blood sugar issues, such as excessive thirst or urination.  He walks about four miles three days a week and reports good exertion and endurance without the need for nitroglycerin . No pain in his legs and feet when walking.  No stomach symptoms and he had a bowel movement this morning without any issues.       Outpatient Medications Prior to Visit  Medication Sig Dispense Refill   Albuterol -Budesonide  (AIRSUPRA ) 90-80 MCG/ACT AERO Inhale 2 puffs into the lungs every 4 (four) hours as needed. 10.7 g 1   allopurinol  (ZYLOPRIM ) 300 MG tablet TAKE 1 TABLET(300 MG) BY MOUTH DAILY 90 tablet 1   aspirin  EC 81 MG tablet Take 1 tablet (81 mg total) by mouth daily. 30 tablet 3   cetirizine (ZYRTEC) 10 MG tablet Take 10 mg by mouth daily.     cholecalciferol  (VITAMIN D ) 1000 units tablet Take 1,000 Units by mouth daily.     clomiPHENE (CLOMID) 50 MG tablet Take 50 mg by mouth every other day.     colchicine  0.6 MG tablet Take 1 tablet (0.6 mg total) by mouth 2 (two) times daily. 180 tablet 0   Continuous Glucose Sensor (DEXCOM G7 SENSOR) MISC Use to check glucose continuously, change sensor every 10 days 9 each 3   docusate sodium  (COLACE) 100 MG capsule Take 100 mg by mouth daily.     Empagliflozin -metFORMIN  HCl ER (SYNJARDY  XR) 25-1000 MG TB24 Take 1 tablet by  mouth daily. 90 tablet 3   fluticasone  (FLONASE ) 50 MCG/ACT nasal spray Place 2 sprays into both nostrils daily. 16 g 6   folic acid  (FOLVITE ) 1 MG tablet Take 1 mg by mouth daily.  1   glucose blood test strip Use as instructed 1x a day 100 each 3   ipratropium-albuterol  (DUONEB) 0.5-2.5 (3) MG/3ML SOLN Take 3 mLs by nebulization every 4 (four) hours as needed. 360 mL 1   metoprolol  succinate (TOPROL -XL) 50 MG 24 hr tablet Take 50 mg by mouth daily. Take with or immediately following a meal.     nitroGLYCERIN  (NITROSTAT ) 0.4 MG SL tablet Place 1 tablet (0.4 mg total) under the tongue every 5 (five) minutes as needed for chest pain. 25 tablet 3   OneTouch Delica Lancets 33G MISC Use 1x a day 100 each 1   pantoprazole  (PROTONIX ) 40 MG tablet TAKE 2 TABLETS(80 MG) BY MOUTH DAILY 180 tablet 0   rosuvastatin  (CRESTOR ) 40 MG tablet TAKE 1 TAB BY MOUTH DAILY 90 tablet 1   sacubitril-valsartan  (ENTRESTO) 49-51 MG Take 1 tablet by mouth 2 (two) times daily. Via NOVARTIS pt assistance     saw palmetto  500 MG capsule Take 500 mg by mouth daily.     Semaglutide  (RYBELSUS ) 7 MG TABS Take 1 tablet (7 mg total) by mouth daily. 90 tablet 3   tadalafil  (CIALIS )  20 MG tablet TAKE ONE TABLET BY MOUTH DAILY AS NEEDED 20 tablet 11   Zinc Sulfate (ZINC 15 PO) Take 1 tablet by mouth daily.     Blood Glucose Monitoring Suppl (ONETOUCH VERIO REFLECT) w/Device KIT Use as advised (Patient not taking: Reported on 10/12/2023) 1 kit 0   Continuous Glucose Sensor (FREESTYLE LIBRE 3 SENSOR) MISC 1 EACH BY DOES NOT APPLY ROUTE EVERY 14 (FOURTEEN) DAYS. (Patient not taking: Reported on 10/12/2023) 6 each 3   HYDROcodone  bit-homatropine (HYCODAN) 5-1.5 MG/5ML syrup Take 5 mLs by mouth every 8 (eight) hours as needed for cough. (Patient not taking: Reported on 10/12/2023) 120 mL 0   No facility-administered medications prior to visit.    ROS Review of Systems  Objective:  BP 132/82 (BP Location: Left Arm, Patient Position:  Sitting)   Pulse 80   Temp 97.9 F (36.6 C) (Temporal)   Resp 16   Ht 6\' 3"  (1.905 m)   Wt 261 lb (118.4 kg)   SpO2 99%   BMI 32.62 kg/m   BP Readings from Last 3 Encounters:  10/12/23 132/82  07/26/23 120/70  05/25/23 (!) 136/92    Wt Readings from Last 3 Encounters:  10/12/23 261 lb (118.4 kg)  07/26/23 260 lb (117.9 kg)  05/25/23 246 lb 6.4 oz (111.8 kg)    Physical Exam Vitals reviewed.  Constitutional:      Appearance: Normal appearance.  HENT:     Nose: Nose normal.     Mouth/Throat:     Mouth: Mucous membranes are moist.  Eyes:     General: No scleral icterus.    Conjunctiva/sclera: Conjunctivae normal.  Cardiovascular:     Rate and Rhythm: Normal rate and regular rhythm.     Heart sounds: No murmur heard.    No friction rub. No gallop.     Comments: EKG--- NSR, 80 bpm LAE Anterior infarct pattern - old No LVH unchanged Pulmonary:     Effort: Pulmonary effort is normal.     Breath sounds: No stridor. No wheezing, rhonchi or rales.  Abdominal:     General: Abdomen is flat.     Palpations: There is no mass.     Tenderness: There is no abdominal tenderness. There is no guarding.     Hernia: No hernia is present.  Musculoskeletal:        General: Normal range of motion.     Cervical back: Neck supple.     Right lower leg: No edema.     Left lower leg: No edema.  Lymphadenopathy:     Cervical: No cervical adenopathy.  Skin:    General: Skin is warm and dry.  Neurological:     General: No focal deficit present.     Mental Status: He is alert. Mental status is at baseline.  Psychiatric:        Mood and Affect: Mood normal.        Behavior: Behavior normal.        Thought Content: Thought content normal.        Judgment: Judgment normal.     Lab Results  Component Value Date   WBC 5.5 10/13/2022   HGB 15.3 10/13/2022   HCT 46.4 10/13/2022   PLT 227.0 10/13/2022   GLUCOSE 131 (H) 08/14/2022   CHOL 112 04/16/2022   TRIG 53.0 04/16/2022    HDL 41.30 04/16/2022   LDLCALC 60 04/16/2022   ALT 18 08/14/2022   AST 19 08/14/2022   NA 141 08/14/2022  K 4.0 08/14/2022   CL 105 08/14/2022   CREATININE 0.77 08/14/2022   BUN 13 08/14/2022   CO2 28 08/14/2022   TSH 1.22 04/16/2022   PSA 1.0 09/06/2023   INR 0.89 05/24/2016   HGBA1C 6.9 (A) 07/26/2023   MICROALBUR 4.2 (H) 04/16/2022    ECHOCARDIOGRAM COMPLETE Result Date: 10/29/2022    ECHOCARDIOGRAM REPORT   Patient Name:   LOLA DISPENZA Date of Exam: 10/29/2022 Medical Rec #:  [id number]         Height:       75.0 in Accession #:    [id number]        Weight:       249.6 lb Date of Birth:  1970/12/15          BSA:          2.411 m Patient Age:    51 years          BP:           120/82 mmHg Patient Gender: M                 HR:           90 bpm. Exam Location:  Outpatient Procedure: 2D Echo and Strain Analysis Indications:    Dyspnea R06.00  History:        Patient has prior history of Echocardiogram examinations, most                 recent 10/11/2019. CAD, TIA, Signs/Symptoms:Edema and Dyspnea;                 Risk Factors:Hypertension, Dyslipidemia, GERD and Diabetes.                 Pericarditis.  Sonographer:    Ruta Cousins RDCS Referring Phys: [phone] [facility]  Sonographer Comments: Global longitudinal strain was attempted. IMPRESSIONS  1. Left ventricular ejection fraction, by estimation, is 60 to 65%. The left ventricle has normal function. The left ventricle has no regional wall motion abnormalities. GLS - 17.8%.  2. Right ventricular systolic function is normal. The right ventricular size is normal.  3. The mitral valve is normal in structure. No evidence of mitral valve regurgitation. No evidence of mitral stenosis.  4. The aortic valve is normal in structure. Aortic valve regurgitation is not visualized. No aortic stenosis is present.  5. The inferior vena cava is normal in size with greater than 50% respiratory variability, suggesting right atrial pressure of 3 mmHg. FINDINGS   Left Ventricle: Left ventricular ejection fraction, by estimation, is 60 to 65%. The left ventricle has normal function. The left ventricle has no regional wall motion abnormalities. The left ventricular internal cavity size was normal in size. There is  borderline concentric left ventricular hypertrophy. Left ventricular diastolic parameters were normal. Right Ventricle: The right ventricular size is normal. No increase in right ventricular wall thickness. Right ventricular systolic function is normal. Left Atrium: Left atrial size was normal in size. Right Atrium: Right atrial size was normal in size. Pericardium: There is no evidence of pericardial effusion. Mitral Valve: The mitral valve is normal in structure. No evidence of mitral valve regurgitation. No evidence of mitral valve stenosis. MV peak gradient, 3.9 mmHg. The mean mitral valve gradient is 2.0 mmHg. Tricuspid Valve: The tricuspid valve is normal in structure. Tricuspid valve regurgitation is not demonstrated. No evidence of tricuspid stenosis. Aortic Valve: The aortic valve is normal in structure. Aortic valve regurgitation is not  visualized. No aortic stenosis is present. Pulmonic Valve: The pulmonic valve was normal in structure. Pulmonic valve regurgitation is not visualized. No evidence of pulmonic stenosis. Aorta: The aortic root is normal in size and structure. Venous: The inferior vena cava is normal in size with greater than 50% respiratory variability, suggesting right atrial pressure of 3 mmHg. IAS/Shunts: No atrial level shunt detected by color flow Doppler.  LEFT VENTRICLE PLAX 2D LVIDd:         4.60 cm      Diastology LVIDs:         2.80 cm      LV e' medial:    8.15 cm/s LV PW:         1.10 cm      LV E/e' medial:  10.0 LV IVS:        1.10 cm      LV e' lateral:   12.10 cm/s LVOT diam:     2.20 cm      LV E/e' lateral: 6.7 LV SV:         71 LV SV Index:   29 LVOT Area:     3.80 cm  LV Volumes (MOD) LV vol d, MOD A2C: 116.0 ml LV vol  d, MOD A4C: 94.3 ml LV vol s, MOD A2C: 53.9 ml LV vol s, MOD A4C: 38.6 ml LV SV MOD A2C:     62.1 ml LV SV MOD A4C:     94.3 ml LV SV MOD BP:      59.6 ml RIGHT VENTRICLE RV S prime:     11.50 cm/s TAPSE (M-mode): 2.0 cm LEFT ATRIUM             Index        RIGHT ATRIUM           Index LA diam:        4.40 cm 1.82 cm/m   RA Area:     13.20 cm LA Vol (A2C):   50.9 ml 21.11 ml/m  RA Volume:   27.20 ml  11.28 ml/m LA Vol (A4C):   45.2 ml 18.75 ml/m LA Biplane Vol: 49.0 ml 20.32 ml/m  AORTIC VALVE LVOT Vmax:   113.00 cm/s LVOT Vmean:  71.800 cm/s LVOT VTI:    0.186 m  AORTA Ao Root diam: 3.70 cm Ao Asc diam:  3.70 cm MITRAL VALVE MV Area (PHT): 4.31 cm    SHUNTS MV Area VTI:   3.07 cm    Systemic VTI:  0.19 m MV Peak grad:  3.9 mmHg    Systemic Diam: 2.20 cm MV Mean grad:  2.0 mmHg MV Vmax:       0.99 m/s MV Vmean:      68.3 cm/s MV Decel Time: 176 msec MV E velocity: 81.20 cm/s MV A velocity: 91.20 cm/s MV E/A ratio:  0.89 Aditya Sabharwal Electronically signed by Alwin Baars Signature Date/Time: 10/29/2022/3:54:16 PM    Final     Assessment & Plan:  Sarcoidosis (HCC) -     Basic metabolic panel with GFR; Future  Hyperlipidemia with target LDL less than 70 -     Lipid panel; Future  Type 2 diabetes mellitus with obesity (HCC) -     Microalbumin / creatinine urine ratio; Future -     HM Diabetes Foot Exam  Idiopathic gout, unspecified chronicity, unspecified site -     Uric acid; Future  Hepatic steatosis -     Hepatic function panel; Future -  Protime-INR; Future  Essential hypertension -     TSH; Future -     Urinalysis, Routine w reflex microscopic; Future -     CBC with Differential/Platelet; Future -     Basic metabolic panel with GFR; Future -     EKG 12-Lead  Immunization due -     Tdap vaccine greater than or equal to 7yo IM -     Pneumococcal polysaccharide vaccine 23-valent greater than or equal to 2yo subcutaneous/IM     Follow-up: Return in about 6 months  (around 04/13/2024).  Sandra Crouch, MD

## 2023-10-12 NOTE — Patient Instructions (Signed)
 Health Maintenance, Male  Adopting a healthy lifestyle and getting preventive care are important in promoting health and wellness. Ask your health care provider about:  The right schedule for you to have regular tests and exams.  Things you can do on your own to prevent diseases and keep yourself healthy.  What should I know about diet, weight, and exercise?  Eat a healthy diet    Eat a diet that includes plenty of vegetables, fruits, low-fat dairy products, and lean protein.  Do not eat a lot of foods that are high in solid fats, added sugars, or sodium.  Maintain a healthy weight  Body mass index (BMI) is a measurement that can be used to identify possible weight problems. It estimates body fat based on height and weight. Your health care provider can help determine your BMI and help you achieve or maintain a healthy weight.  Get regular exercise  Get regular exercise. This is one of the most important things you can do for your health. Most adults should:  Exercise for at least 150 minutes each week. The exercise should increase your heart rate and make you sweat (moderate-intensity exercise).  Do strengthening exercises at least twice a week. This is in addition to the moderate-intensity exercise.  Spend less time sitting. Even light physical activity can be beneficial.  Watch cholesterol and blood lipids  Have your blood tested for lipids and cholesterol at 51 years of age, then have this test every 5 years.  You may need to have your cholesterol levels checked more often if:  Your lipid or cholesterol levels are high.  You are older than 51 years of age.  You are at high risk for heart disease.  What should I know about cancer screening?  Many types of cancers can be detected early and may often be prevented. Depending on your health history and family history, you may need to have cancer screening at various ages. This may include screening for:  Colorectal cancer.  Prostate cancer.  Skin cancer.  Lung  cancer.  What should I know about heart disease, diabetes, and high blood pressure?  Blood pressure and heart disease  High blood pressure causes heart disease and increases the risk of stroke. This is more likely to develop in people who have high blood pressure readings or are overweight.  Talk with your health care provider about your target blood pressure readings.  Have your blood pressure checked:  Every 3-5 years if you are 9-95 years of age.  Every year if you are 85 years old or older.  If you are between the ages of 29 and 29 and are a current or former smoker, ask your health care provider if you should have a one-time screening for abdominal aortic aneurysm (AAA).  Diabetes  Have regular diabetes screenings. This checks your fasting blood sugar level. Have the screening done:  Once every three years after age 23 if you are at a normal weight and have a low risk for diabetes.  More often and at a younger age if you are overweight or have a high risk for diabetes.  What should I know about preventing infection?  Hepatitis B  If you have a higher risk for hepatitis B, you should be screened for this virus. Talk with your health care provider to find out if you are at risk for hepatitis B infection.  Hepatitis C  Blood testing is recommended for:  Everyone born from [birth year] through 1963.  Anyone  with known risk factors for hepatitis C.  Sexually transmitted infections (STIs)  You should be screened each year for STIs, including gonorrhea and chlamydia, if:  You are sexually active and are younger than 51 years of age.  You are older than 51 years of age and your health care provider tells you that you are at risk for this type of infection.  Your sexual activity has changed since you were last screened, and you are at increased risk for chlamydia or gonorrhea. Ask your health care provider if you are at risk.  Ask your health care provider about whether you are at high risk for HIV. Your health care provider  may recommend a prescription medicine to help prevent HIV infection. If you choose to take medicine to prevent HIV, you should first get tested for HIV. You should then be tested every 3 months for as long as you are taking the medicine.  Follow these instructions at home:  Alcohol use  Do not drink alcohol if your health care provider tells you not to drink.  If you drink alcohol:  Limit how much you have to 0-2 drinks a day.  Know how much alcohol is in your drink. In the [place] one drink equals one 12 oz bottle of beer (355 mL), one 5 oz glass of wine (148 mL), or one 1 oz glass of hard liquor (44 mL).  Lifestyle  Do not use any products that contain nicotine or tobacco. These products include cigarettes, chewing tobacco, and vaping devices, such as e-cigarettes. If you need help quitting, ask your health care provider.  Do not use street drugs.  Do not share needles.  Ask your health care provider for help if you need support or information about quitting drugs.  General instructions  Schedule regular health, dental, and eye exams.  Stay current with your vaccines.  Tell your health care provider if:  You often feel depressed.  You have ever been abused or do not feel safe at home.  Summary  Adopting a healthy lifestyle and getting preventive care are important in promoting health and wellness.  Follow your health care provider's instructions about healthy diet, exercising, and getting tested or screened for diseases.  Follow your health care provider's instructions on monitoring your cholesterol and blood pressure.  This information is not intended to replace advice given to you by your health care provider. Make sure you discuss any questions you have with your health care provider.  Document Revised: 10/14/2020 Document Reviewed: 10/14/2020  Elsevier Patient Education  2024 [employer].

## 2023-10-13 ENCOUNTER — Encounter: Payer: Self-pay | Admitting: Internal Medicine

## 2023-10-13 ENCOUNTER — Encounter (HOSPITAL_COMMUNITY): Payer: Self-pay | Admitting: Cardiology

## 2023-10-14 ENCOUNTER — Other Ambulatory Visit (HOSPITAL_COMMUNITY): Payer: Self-pay | Admitting: Cardiology

## 2023-10-14 DIAGNOSIS — R079 Chest pain, unspecified: Secondary | ICD-10-CM

## 2023-10-14 DIAGNOSIS — Z23 Encounter for immunization: Secondary | ICD-10-CM | POA: Insufficient documentation

## 2023-10-14 DIAGNOSIS — Z0001 Encounter for general adult medical examination with abnormal findings: Secondary | ICD-10-CM | POA: Insufficient documentation

## 2023-10-14 DIAGNOSIS — I5030 Unspecified diastolic (congestive) heart failure: Secondary | ICD-10-CM

## 2023-10-18 ENCOUNTER — Encounter (HOSPITAL_BASED_OUTPATIENT_CLINIC_OR_DEPARTMENT_OTHER): Payer: Self-pay

## 2023-10-18 ENCOUNTER — Emergency Department (HOSPITAL_BASED_OUTPATIENT_CLINIC_OR_DEPARTMENT_OTHER): Admission: EM | Admit: 2023-10-18 | Discharge: 2023-10-18 | Disposition: A | Discharge status: Home / Self Care

## 2023-10-18 ENCOUNTER — Emergency Department (HOSPITAL_BASED_OUTPATIENT_CLINIC_OR_DEPARTMENT_OTHER)

## 2023-10-18 ENCOUNTER — Other Ambulatory Visit: Payer: Self-pay

## 2023-10-18 DIAGNOSIS — Z7982 Long term (current) use of aspirin: Secondary | ICD-10-CM | POA: Insufficient documentation

## 2023-10-18 DIAGNOSIS — R0789 Other chest pain: Secondary | ICD-10-CM | POA: Insufficient documentation

## 2023-10-18 DIAGNOSIS — I10 Essential (primary) hypertension: Secondary | ICD-10-CM | POA: Diagnosis not present

## 2023-10-18 DIAGNOSIS — R110 Nausea: Secondary | ICD-10-CM | POA: Diagnosis not present

## 2023-10-18 DIAGNOSIS — E119 Type 2 diabetes mellitus without complications: Secondary | ICD-10-CM | POA: Insufficient documentation

## 2023-10-18 DIAGNOSIS — Z79899 Other long term (current) drug therapy: Secondary | ICD-10-CM | POA: Diagnosis not present

## 2023-10-18 DIAGNOSIS — R5383 Other fatigue: Secondary | ICD-10-CM | POA: Insufficient documentation

## 2023-10-18 LAB — COMPREHENSIVE METABOLIC PANEL WITH GFR
ALT: 18 U/L (ref 0–44)
AST: 22 U/L (ref 15–41)
Albumin: 4.4 g/dL (ref 3.5–5.0)
Alkaline Phosphatase: 85 U/L (ref 38–126)
Anion gap: 12 (ref 5–15)
BUN: 12 mg/dL (ref 6–20)
CO2: 28 mmol/L (ref 22–32)
Calcium: 10.1 mg/dL (ref 8.9–10.3)
Chloride: 100 mmol/L (ref 98–111)
Creatinine, Ser: 0.77 mg/dL (ref 0.61–1.24)
GFR, Estimated: >60 mL/min (ref >60)
Glucose, Bld: 111 mg/dL — ABNORMAL HIGH (ref 70–99)
Potassium: 3.8 mmol/L (ref 3.5–5.1)
Sodium: 140 mmol/L (ref 135–145)
Total Bilirubin: 0.3 mg/dL (ref 0.0–1.2)
Total Protein: 8 g/dL (ref 6.5–8.1)

## 2023-10-18 LAB — CBC
HCT: 45.6 % (ref 39.0–52.0)
Hemoglobin: 15.4 g/dL (ref 13.0–17.0)
MCH: 26.8 pg (ref 26.0–34.0)
MCHC: 33.8 g/dL (ref 30.0–36.0)
MCV: 79.4 fL — ABNORMAL LOW (ref 80.0–100.0)
Platelets: 294 10*3/uL (ref 150–400)
RBC: 5.74 MIL/uL (ref 4.22–5.81)
RDW: 13.5 % (ref 11.5–15.5)
WBC: 5.9 10*3/uL (ref 4.0–10.5)
nRBC: 0 % (ref 0.0–0.2)

## 2023-10-18 LAB — URINALYSIS, ROUTINE W REFLEX MICROSCOPIC
Bilirubin Urine: NEGATIVE
Glucose, UA: >=500 mg/dL — AB
Hgb urine dipstick: NEGATIVE
Ketones, ur: NEGATIVE mg/dL
Leukocytes,Ua: NEGATIVE
Nitrite: NEGATIVE
Protein, ur: NEGATIVE mg/dL
Specific Gravity, Urine: 1.025 (ref 1.005–1.030)
pH: 5.5 (ref 5.0–8.0)

## 2023-10-18 LAB — TROPONIN T, HIGH SENSITIVITY
Troponin T High Sensitivity: <15 ng/L (ref <19)
Troponin T High Sensitivity: <15 ng/L (ref <19)

## 2023-10-18 LAB — PROTIME-INR
INR: 0.9 (ref 0.8–1.2)
Prothrombin Time: 12.7 s (ref 11.4–15.2)

## 2023-10-18 LAB — D-DIMER, QUANTITATIVE: D-Dimer, Quant: 0.34 ug{FEU}/mL (ref 0.00–0.50)

## 2023-10-18 LAB — URINALYSIS, MICROSCOPIC (REFLEX): WBC, UA: NONE SEEN WBC/hpf (ref 0–5)

## 2023-10-18 MED ORDER — ONDANSETRON HCL 4 MG/2ML IJ SOLN
INTRAMUSCULAR | Status: DC
Start: 2023-10-18 — End: 2023-10-19
  Filled 2023-10-18: qty 2

## 2023-10-18 MED ORDER — ONDANSETRON HCL 4 MG/2ML IJ SOLN
4.0000 mg | Freq: Once | INTRAMUSCULAR | Status: AC
  Administered 2023-10-18: 4 mg via INTRAVENOUS

## 2023-10-18 MED ORDER — ONDANSETRON 4 MG PO TBDP: 4.0000 mg | ORAL_TABLET | Freq: Three times a day (TID) | ORAL | 0 refills | Status: DC | PRN

## 2023-10-18 MED ORDER — MORPHINE SULFATE (PF) 4 MG/ML IV SOLN
4.0000 mg | Freq: Once | INTRAVENOUS | Status: AC
  Administered 2023-10-18: 4 mg via INTRAVENOUS

## 2023-10-18 MED ORDER — ASPIRIN 325 MG PO TABS
ORAL_TABLET | ORAL | Status: DC
Start: 2023-10-18 — End: 2023-10-18
  Filled 2023-10-18: qty 1

## 2023-10-18 MED ORDER — ASPIRIN 81 MG PO CHEW
162.0000 mg | CHEWABLE_TABLET | Freq: Once | ORAL | Status: AC
  Administered 2023-10-18: 162 mg via ORAL
  Filled 2023-10-18: qty 2

## 2023-10-18 MED ORDER — MORPHINE SULFATE (PF) 4 MG/ML IV SOLN
INTRAVENOUS | Status: AC
  Filled 2023-10-18: qty 1

## 2023-10-18 MED ORDER — LACTATED RINGERS IV BOLUS
1000.0000 mL | Freq: Once | INTRAVENOUS | Status: AC
  Administered 2023-10-18: 1000 mL via INTRAVENOUS

## 2023-10-18 NOTE — ED Notes (Signed)
 Patient transported to X-ray

## 2023-10-18 NOTE — ED Notes (Signed)
 Called lab inquiring about trop that I sent at 1750 They say there was no order for it and hemolyzed. Redrew trop and sent to lab

## 2023-10-18 NOTE — ED Triage Notes (Signed)
 Pt reports that he has a throbbing in left arm that is accompanied by fatigue, SOB, nausea. Fatigue. Reports that he had a heart attack in 2015 and this feels different. Denies chest pain. Pt states that his symptoms have been getting worse over the last week. States that he saw his cardiologist on Tuesday. He is to have a stress test soon.

## 2023-10-18 NOTE — Discharge Instructions (Signed)
 Your workup today was reassuring.  Your chest x-ray did not show any concerning findings.  Your blood test for heart issues as well as blood clots were both negative.  Please take the Zofran  as needed for nausea.  Please follow-up this week for your cardiac testing as an outpatient.  Please return to the emergency department for worsening symptoms.

## 2023-10-18 NOTE — ED Provider Notes (Signed)
 [facility] Provider Note   CSN: [id number] Arrival date & time: 10/18/23  1530     History  Chief Complaint  Patient presents with   Fatigue    [name] is a 53 y.o. male.  53 year old male with past medical history of diabetes, hypertension, and hyperlipidemia presenting to the emergency department today with chest discomfort, fatigue, and dyspnea on exertion since Friday.  The patient denies any fevers, chills, or cough.  He states that he was complaining of this again today and his wife brought him to the emergency department for further evaluation.  The patient reports that he has had catheterizations in the past that been unremarkable.  He states that the pain is more of a pressure sensation and is somewhat exertional.        Home Medications Prior to Admission medications   Medication Sig Start Date End Date Taking? Authorizing Provider  ondansetron  (ZOFRAN -ODT) 4 MG disintegrating tablet Take 1 tablet (4 mg total) by mouth every 8 (eight) hours as needed for nausea or vomiting. 10/18/23  Yes Carin Charleston, MD  Albuterol -Budesonide  (AIRSUPRA ) 90-80 MCG/ACT AERO Inhale 2 puffs into the lungs every 4 (four) hours as needed. 08/03/23   Wellington Half, FNP  allopurinol  (ZYLOPRIM ) 300 MG tablet TAKE 1 TABLET(300 MG) BY MOUTH DAILY 07/03/22   Arcadio Knuckles, MD  aspirin  EC 81 MG tablet Take 1 tablet (81 mg total) by mouth daily. 04/29/15   Carolene Chute, MD  Blood Glucose Monitoring Suppl (ONETOUCH VERIO REFLECT) w/Device KIT Use as advised Patient not taking: Reported on 10/12/2023 07/26/23   Emilie Harden, MD  cetirizine (ZYRTEC) 10 MG tablet Take 10 mg by mouth daily.    [provider]  cholecalciferol  (VITAMIN D ) 1000 units tablet Take 1,000 Units by mouth daily.    [provider]  clomiPHENE (CLOMID) 50 MG tablet Take 50 mg by mouth every other day. 08/05/19   [provider]   colchicine  0.6 MG tablet Take 1 tablet (0.6 mg total) by mouth 2 (two) times daily. 06/12/21   Arcadio Knuckles, MD  Continuous Glucose Sensor (DEXCOM G7 SENSOR) MISC Use to check glucose continuously, change sensor every 10 days 10/06/23   Emilie Harden, MD  docusate sodium  (COLACE) 100 MG capsule Take 100 mg by mouth daily.    [provider]  Empagliflozin -metFORMIN  HCl ER (SYNJARDY  XR) 25-1000 MG TB24 Take 1 tablet by mouth daily. 01/19/23   Emilie Harden, MD  fluticasone  (FLONASE ) 50 MCG/ACT nasal spray Place 2 sprays into both nostrils daily. 08/03/23   Wellington Half, FNP  folic acid  (FOLVITE ) 1 MG tablet Take 1 mg by mouth daily. 04/24/16   [provider]  glucose blood test strip Use as instructed 1x a day 07/26/23   Emilie Harden, MD  ipratropium-albuterol  (DUONEB) 0.5-2.5 (3) MG/3ML SOLN Take 3 mLs by nebulization every 4 (four) hours as needed. 08/03/23   Wellington Half, FNP  metoprolol  succinate (TOPROL -XL) 50 MG 24 hr tablet Take 50 mg by mouth daily. Take with or immediately following a meal.    Chapman Commodore, MD  nitroGLYCERIN  (NITROSTAT ) 0.4 MG SL tablet Place 1 tablet (0.4 mg total) under the tongue every 5 (five) minutes as needed for chest pain. 06/12/21   Arcadio Knuckles, MD  OneTouch Delica Lancets 33G MISC Use 1x a day 07/26/23   Emilie Harden, MD  pantoprazole  (PROTONIX ) 40 MG tablet TAKE 2 TABLETS(80 MG)  BY MOUTH DAILY 10/09/21   Arcadio Knuckles, MD  rosuvastatin  (CRESTOR ) 40 MG tablet TAKE 1 TAB BY MOUTH DAILY 07/29/23   Arcadio Knuckles, MD  sacubitril-valsartan  (ENTRESTO) 49-51 MG Take 1 tablet by mouth 2 (two) times daily. Via NOVARTIS pt assistance    Chapman Commodore, MD  saw palmetto  500 MG capsule Take 500 mg by mouth daily.    [provider]  Semaglutide  (RYBELSUS ) 7 MG TABS Take 1 tablet (7 mg total) by mouth daily. 01/19/23   Emilie Harden, MD  tadalafil  (CIALIS ) 20 MG tablet TAKE ONE TABLET BY MOUTH DAILY AS NEEDED  04/02/22   McKenzie, Arden Beck, MD  Zinc Sulfate (ZINC 15 PO) Take 1 tablet by mouth daily.    [provider]      Allergies    Amlodipine , Sulfamethoxazole, and Sulfonamide derivatives    Review of Systems   Review of Systems  Respiratory:  Positive for shortness of breath.   Cardiovascular:  Positive for chest pain.  All other systems reviewed and are negative.   Physical Exam Updated Vital Signs BP 138/85   Pulse 83   Temp 97.7 F (36.5 C)   Resp 16   Ht 6\' 3"  (1.905 m)   Wt 113.4 kg   SpO2 99%   BMI 31.25 kg/m  Physical Exam Vitals and nursing note reviewed.   Gen: NAD Eyes: PERRL, EOMI HEENT: no oropharyngeal swelling Neck: trachea midline Resp: clear to auscultation bilaterally Card: RRR, no murmurs, rubs, or gallops Abd: nontender, nondistended Extremities: no calf tenderness, no edema Vascular: 2+ radial pulses bilaterally, 2+ DP pulses bilaterally Neuro: no focal deficits Skin: no rashes Psyc: acting appropriately   ED Results / Procedures / Treatments   Labs (all labs ordered are listed, but only abnormal results are displayed) Labs Reviewed  COMPREHENSIVE METABOLIC PANEL WITH GFR - Abnormal; Notable for the following components:      Result Value   Glucose, Bld 111 (*)    All other components within normal limits  CBC - Abnormal; Notable for the following components:   MCV 79.4 (*)    All other components within normal limits  URINALYSIS, ROUTINE W REFLEX MICROSCOPIC - Abnormal; Notable for the following components:   Glucose, UA >=500 (*)    All other components within normal limits  URINALYSIS, MICROSCOPIC (REFLEX) - Abnormal; Notable for the following components:   Bacteria, UA RARE (*)    All other components within normal limits  PROTIME-INR  D-DIMER, QUANTITATIVE  TROPONIN T, HIGH SENSITIVITY  TROPONIN T, HIGH SENSITIVITY    EKG EKG Interpretation Date/Time:  Monday Oct 18 2023 15:41:50 EDT Ventricular Rate:  94 PR  Interval:  192 QRS Duration:  84 QT Interval:  356 QTC Calculation: 445 R Axis:   3  Text Interpretation: Normal sinus rhythm Cannot rule out Anterior infarct , age undetermined J point elevation, PR depression, similar to previous EKG without reciprocal changes.  Discussed with Dr. Arlester Ladd.  No STEMI activation at this time. When compared with ECG of 19-Sep-2017 09:53, PREVIOUS ECG IS PRESENT Confirmed by Abner Hoffman [phone]) on 10/18/2023 3:59:23 PM  Radiology DG Chest 2 View Result Date: 10/18/2023 CLINICAL DATA:  Left arm throbbing with fatigue, shortness of breath and nausea. EXAM: CHEST - 2 VIEW COMPARISON:  September 19, 2017 FINDINGS: The heart size and mediastinal contours are within normal limits. Both lungs are clear. Radiopaque surgical clips are seen within the right upper quadrant. The visualized skeletal structures are unremarkable. IMPRESSION:  No active cardiopulmonary disease. Electronically Signed   By: Virgle Grime M.D.   On: 10/18/2023 19:01    Procedures Procedures    Medications Ordered in ED Medications  morphine  (PF) 4 MG/ML injection 4 mg (4 mg Intravenous Given 10/18/23 1550)  ondansetron  (ZOFRAN ) injection 4 mg (4 mg Intravenous Given 10/18/23 1551)  aspirin  chewable tablet 162 mg (162 mg Oral Given 10/18/23 1555)  lactated ringers  bolus 1,000 mL (0 mLs Intravenous Stopped 10/18/23 2000)    ED Course/ Medical Decision Making/ A&P                                 Medical Decision Making 53 year old male with past medical history of diabetes, hypertension, and hyperlipidemia presenting to the emergency department today with chest pain.  I will further evaluate him here with basic labs well as an EKG, chest x-ray, and troponin for further evaluation for ACS, pulmonary edema, pulmonary infiltrates, pneumothorax, myocarditis, or pericarditis.  Will give the patient morphine  and Zofran .  Will give him aspirin  here as well.  His initial EKG shows what appears to be more  consistent with J-point elevation and possibly some PR depression.  This does appear relatively similar in morphology to his previous EKG.  At the time my receive the EKG I did call and discussed this with Dr. Arlester Ladd who reviewed this and agrees.  We will obtain a troponin here and after his current case is finished he will check back in but we will hold off on activating a STEMI at this time as this does appear relatively similar in morphology to his previous EKG.  The patient's troponins here were negative.  I did add on a D-dimer as well which was also negative.  The patient's symptoms did improve with medications here.  With his reassuring workup I think that he is stable for discharge.  Did discuss with Dr. Arlester Ladd who agrees with outpatient follow-up.  He does have a cardiology appointment for echocardiogram and possible stress test later this week.  He is encouraged to keep this appointment and to return to the ER for worsening symptoms.  Amount and/or Complexity of Data Reviewed Labs: ordered. Radiology: ordered.  Risk OTC drugs. Prescription drug management.           Final Clinical Impression(s) / ED Diagnoses Final diagnoses:  Chest pressure  Other fatigue  Nausea    Rx / DC Orders ED Discharge Orders          Ordered    ondansetron  (ZOFRAN -ODT) 4 MG disintegrating tablet  Every 8 hours PRN        10/18/23 2055    Ambulatory referral to Cardiology       Comments: If you have not heard from the Cardiology office within the next 72 hours please call [phone].   10/18/23 2056              Carin Charleston, MD 10/18/23 [phone]

## 2023-10-19 ENCOUNTER — Telehealth: Payer: Self-pay | Admitting: Internal Medicine

## 2023-10-19 MED ORDER — DEXCOM G7 SENSOR MISC: 1.0000 | 0 refills | Status: AC

## 2023-10-19 NOTE — Telephone Encounter (Signed)
 Copied from CRM [id number]. Topic: General - Other >> Oct 19, 2023  9:38 AM Annelle Kiel wrote: Reason for CRM: patient is requesting medication for sleeping  he has issues with going to sleep

## 2023-10-19 NOTE — Telephone Encounter (Signed)
 Requested Prescriptions   Signed Prescriptions Disp Refills   Continuous Glucose Sensor (DEXCOM G7 SENSOR) MISC 9 each 0    Sig: 1 Device by Does not apply route continuous.    Authorizing Provider: Emilie Harden    Ordering User: Vernon Goodpasture

## 2023-10-20 LAB — HM DIABETES EYE EXAM

## 2023-10-20 NOTE — Telephone Encounter (Signed)
 Patient has been scheduled for an appointment.

## 2023-10-21 ENCOUNTER — Ambulatory Visit

## 2023-10-21 ENCOUNTER — Telehealth: Payer: Self-pay | Admitting: Primary Care

## 2023-10-21 ENCOUNTER — Other Ambulatory Visit (HOSPITAL_COMMUNITY): Payer: Self-pay

## 2023-10-21 ENCOUNTER — Ambulatory Visit (INDEPENDENT_AMBULATORY_CARE_PROVIDER_SITE_OTHER): Payer: Medicare Other | Admitting: Primary Care

## 2023-10-21 ENCOUNTER — Ambulatory Visit (INDEPENDENT_AMBULATORY_CARE_PROVIDER_SITE_OTHER): Payer: Medicare Other | Admitting: Internal Medicine

## 2023-10-21 ENCOUNTER — Encounter: Payer: Self-pay | Admitting: Primary Care

## 2023-10-21 VITALS — BP 150/90 | HR 90 | Ht 75.0 in | Wt 252.0 lb

## 2023-10-21 VITALS — BP 132/80 | HR 80 | Temp 98.5°F | Ht 75.0 in | Wt 253.4 lb

## 2023-10-21 DIAGNOSIS — D869 Sarcoidosis, unspecified: Secondary | ICD-10-CM | POA: Diagnosis not present

## 2023-10-21 DIAGNOSIS — I10 Essential (primary) hypertension: Secondary | ICD-10-CM | POA: Diagnosis not present

## 2023-10-21 DIAGNOSIS — I200 Unstable angina: Secondary | ICD-10-CM

## 2023-10-21 DIAGNOSIS — Z87891 Personal history of nicotine dependence: Secondary | ICD-10-CM

## 2023-10-21 DIAGNOSIS — I214 Non-ST elevation (NSTEMI) myocardial infarction: Secondary | ICD-10-CM | POA: Diagnosis not present

## 2023-10-21 DIAGNOSIS — J449 Chronic obstructive pulmonary disease, unspecified: Secondary | ICD-10-CM | POA: Diagnosis not present

## 2023-10-21 DIAGNOSIS — I25110 Atherosclerotic heart disease of native coronary artery with unstable angina pectoris: Secondary | ICD-10-CM

## 2023-10-21 DIAGNOSIS — E785 Hyperlipidemia, unspecified: Secondary | ICD-10-CM | POA: Diagnosis not present

## 2023-10-21 LAB — PULMONARY FUNCTION TEST
DL/VA % pred: 144 %
DL/VA: 6.29 ml/min/mmHg/L
DLCO unc % pred: 78 %
DLCO unc: 26.83 ml/min/mmHg
FEF 25-75 Pre: 3.76 L/s
FEF2575-%Pred-Pre: 95 %
FEV1-%Pred-Pre: 65 %
FEV1-Pre: 3.02 L
FEV1FVC-%Pred-Pre: 107 %
FEV6-%Pred-Pre: 62 %
FEV6-Pre: 3.61 L
FEV6FVC-%Pred-Pre: 103 %
FVC-%Pred-Pre: 60 %
FVC-Pre: 3.61 L
Pre FEV1/FVC ratio: 84 %
Pre FEV6/FVC Ratio: 100 %

## 2023-10-21 MED ORDER — ENTRESTO 97-103 MG PO TABS: 1.0000 | ORAL_TABLET | Freq: Two times a day (BID) | ORAL | 3 refills | Status: DC

## 2023-10-21 MED ORDER — TRELEGY ELLIPTA 100-62.5-25 MCG/ACT IN AEPB
1.0000 | INHALATION_SPRAY | Freq: Every day | RESPIRATORY_TRACT | Status: AC
Start: 2023-10-21 — End: ?

## 2023-10-21 NOTE — Patient Instructions (Addendum)
 Medication Instructions:  Your physician has recommended you make the following change in your medication:   START: Entresto 97-103 1 tablet two times daily  *If you need a refill on your cardiac medications before your next appointment, please call your pharmacy*  Lab Work: Your physician recommends that you return for lab work in:   Labs today: CBC, BMP  If you have labs (blood work) drawn today and your tests are completely normal, you will receive your results only by: MyChart Message (if you have MyChart) OR A paper copy in the mail If you have any lab test that is abnormal or we need to change your treatment, we will call you to review the results.  Testing/Procedures:  [place] [facility] DEPT OF [facility] [place] [address] [place], [place] [address] [place] [address] Dept: [phone] Loc: [phone]  [name]  10/21/2023  You are scheduled for a Cardiac Catheterization on Tuesday, May 20 with Dr. Randene Bustard.  1. Please arrive at the [facility] (Main Entrance A) at [facility]: [address] [place], [place] [address] at 5:30 AM (This time is 2 hour(s) before your procedure to ensure your preparation).   Free valet parking service is available. You will check in at ADMITTING. The support person will be asked to wait in the waiting room.  It is OK to have someone drop you off and come back when you are ready to be discharged.    Special note: Every effort is made to have your procedure done on time. Please understand that emergencies sometimes delay scheduled procedures.  2. Diet: Do not eat solid foods after midnight.  The patient may have clear liquids until 5am upon the day of the procedure.  3. Labs: You will need to have blood drawn on Thursday, May 15 at [facility]: [address], [address], [place]. You do not need to be fasting.  4. Medication instructions in  preparation for your procedure:   Contrast Allergy : No  Stop taking, Aldactone Tuesday, May 20,  Hold Synjardy  and Rybelsus  the day of the cardiac cath and 48 hours after the procedure.  On the morning of your procedure, take your Aspirin  81 mg and any morning medicines NOT listed above.  You may use sips of water.  5. Plan to go home the same day, you will only stay overnight if medically necessary. 6. Bring a current list of your medications and current insurance cards. 7. You MUST have a responsible person to drive you home. 8. Someone MUST be with you the first 24 hours after you arrive home or your discharge will be delayed. 9. Please wear clothes that are easy to get on and off and wear slip-on shoes.  Thank you for allowing us  to care for you!   -- [place] [facility]   Follow-Up: At [facility], you and your health needs are our priority.  As part of our continuing mission to provide you with exceptional heart care, our providers are all part of one team.  This team includes your primary Cardiologist (physician) and Advanced Practice Providers or APPs (Physician Assistants and Nurse Practitioners) who all work together to provide you with the care you need, when you need it.  Your next appointment:   4 week(s)  Provider:   Bertha Broad, MD    We recommend signing up for the patient portal called "  MyChart".  Sign up information is provided on this After Visit Summary.  MyChart is used to connect with patients for Virtual Visits (Telemedicine).  Patients are able to view lab/test results, encounter notes, upcoming appointments, etc.  Non-urgent messages can be sent to your provider as well.   To learn more about what you can do with MyChart, go to [email or web address].   Other Instructions Do not take your Testosterone  while you are having this cardiac work up.

## 2023-10-21 NOTE — Progress Notes (Signed)
 @Patient  ID: [name], male    DOB: 1970/07/17, 53 y.o.   MRN: [id number]  Chief Complaint  Patient presents with   Follow-up    F/u after PFT today-increased sob x 6 mths., dry cough, wheezing    Referring provider: Arcadio Knuckles, MD  HPI: 53 year old male, former smoker quit in 2010.  Past medical history significant for pretension, LVH, coronary artery disease, stage IV COPD, hepatic steatosis, 2 diabetes, hyperlipidemia, gout, sarcoidosis.  Previous [facility] pulmonary encounter:  11/30/2022 Patient presents today to review testing results. Hx sarcoid, patient of Dr. Bertrum Brodie.  Patient was last seen in May.  Previous to this he had not been seen for 2-1/2 years.  He was doing well until early 2024 and started having unintentional weight loss and shortness of breath.  Associated dry cough with wheezing and fatigue.  Unclear nature of symptoms.  Could be sarcoid or something else.  He was ordered for lab work, HRCT, echocardiogram and pulmonary function testing along with  He is doing well today without acute complaints. No change to above respiratory symptoms since consult in May. HRCT chest showed no evidence of sarcoid or interstitial lung disease.  Air-trapping indicative of small airway disease.  Advanced left anterior descending coronary artery calcification.  Echocardiogram completed on 10/29/22 showing normal EF, mild concentric left ventricular hypertrophy.  LV diastolic parameters normal.  Pulmonary function testing today showed severe obstructive lung disease, FEV1 19% predicted after bronchodilator.  Normal diffusion capacity. Lab work was unrevealing.  ACE level 27. Class III allergy  to Alternaria Alternata. Eos absolute 200. Mild elevated sed rate.       04/23/2023 -       Chief Complaint  Patient presents with   Follow-up      Breathing is stable today. He has some cough and wheezing occ. He is using airsupra  twice daily.         HPI [name] 53 y.o.  -presents for follow-up.  I had not seen him in 2 and half years and then I saw him in May 2024 with night sweats and chills.  I was really worried about B symptoms and sarcoidosis developing.  He then had workup for that and saw nurse practitioner.  It appears he he has advanced obstructive lung disease stage IV.  Almost like a COPD patient but he has not smoked more than 10 pack smoking history.  He did not have any evidence of active sarcoid his blood eosinophils were normal.  No interstitial lung disease.  Nurse practitioner did place him on Trelegy which worked really well for him.Aaron Aas  His alpha-1 antitrypsin was normal.  However his insurance is refusing to approve his Trelegy.  They want him to try Spiriva  and Advair.  Instead he is on Airsupra  which she got it for a lower cost.  It is not helping him as well.  He takes it as needed every few days.  Up sometimes even twice a day.  He is willing to try Spiriva  and Advair based on insurance recommendations.  There are no other new problems.  Without his Trelegy he feels more symptomatic in terms of shortness of breath and cough.  His current symptom score is below.      10/21/2023 - Interim hx  Discussed the use of AI scribe software for clinical note transcription with the patient, who gave verbal consent to proceed.  History of Present Illness   [name] is a 53 year old male  with sarcoidosis and COPD who presents for a routine follow-up with a breathing test.  He has experienced an improvement in lung function, with results increasing from 28% last year to 65% this year. He recalls being ill last year, which may have affected his lung function test results at that time. Despite this improvement, he has been experiencing increased shortness of breath, coughing, and wheezing since November. These symptoms have impacted his ability to perform yard maintenance, requiring frequent breaks. He rates his shortness of breath as zero at rest, three  during simple tasks, and five when walking up stairs.  He has a history of sarcoidosis diagnosed in 2010 and was initially treated with prednisone . A recent chest x-ray showed clear lungs, and there are no active signs of sarcoidosis on recent imaging. Previous CT scans showed mild air trapping but no fibrotic lung disease or enlarged lymph nodes.  He has a history of smoking and has tried Trelegy in the past, which was effective but not covered by his insurance. Currently, he uses Airsupra  (albuterol  budesonide ) every four hours as needed, but more frequently now, at least a couple of times a day. The Airsupra  does not significantly relieve his symptoms.  He recently visited the emergency room and was informed of a past heart attack. He is scheduled for a nuclear stress test and another echocardiogram with a cardiologist.      SYMPTOM SCALE -general symptom scale on Airsupra  04/23/2023 10/21/2023   Current weight   253lb  O2 use Ra -taking Airsupra  as needed. -> will start Spiriva /Advair Taking Airsupra  as needed. No maintenance BD  Shortness of Breath 0 -> 5 scale with 5 being worst (score 6 If unable to do)   At rest 0 0  Simple tasks - showers, clothes change, eating, shaving 2 3  Household (dishes, doing bed, laundry) 2 4- yard work is Conservator, museum/gallery 2 0  Walking level at own pace 2 3  Walking up Stairs 5 5  Total (30-36) Dyspnea Score 13 15  How bad is your cough? 2   How bad is your fatigue 3   How bad is nausea 0   How bad is vomiting?   0   How bad is diarrhea? 0   How bad is anxiety? 0   How bad is depression 0   Any chronic pain - if so where and how bad 0       TEST  Spirometry 01/14/11 >> FEV1 3.15 (78%), FVC 3.68 (72%), FEV1% 86  Spirometry 12/09/11 >> FEV1 3.25 (80%), FVC 3.85 (76%), FEV1% 84 11/30/2022 >> FVC 2.46 (41%), FEV1 0.88 (19%), ratio 36. TLC 63%, DLCO 29.50 (86%)  Spriometry 10/21/2023 >> FVC 3.61 (60%), FEV1 3.02 (65%), ratio 84, DLCOcor  26.83(78%)  11/30/2022 FENO >> 27    Allergies  Allergen Reactions   Amlodipine  Other (See Comments)    Ankle swelling with 10mg , ok with 5mg  dose   Sulfamethoxazole Rash    fever   Sulfonamide Derivatives Rash and Other (See Comments)    Immunization History  Administered Date(s) Administered   Influenza Split 04/06/2011, 02/17/2012, 03/16/2012   Influenza, Quadrivalent, Recombinant, Inj, Pf 02/20/2022   Influenza,inj,Quad PF,6+ Mos 03/21/2013, 03/08/2014, 04/03/2015, 02/19/2016, 02/17/2017, 06/14/2018, 02/20/2019, 03/21/2020   Influenza-Unspecified 03/08/2021, 02/07/2023   Moderna Sars-Covid-2 Vaccination 07/31/2019, 09/05/2019, 04/19/2020, 02/20/2022   Pfizer(Comirnaty)Fall Seasonal Vaccine 12 years and older 02/20/2022   Pneumococcal Conjugate-13 04/29/2015   Pneumococcal Polysaccharide-23 11/06/2015, 10/12/2023   Tdap 03/21/2013, 10/12/2023  Past Medical History:  Diagnosis Date   Anginal pain (HCC)    Arthritis    ? 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 Followed by Dr. Meredith Stalls   Arthritis    "ankles" (04/13/2016)   Asthma    CORONARY ARTERY DISEASE    a. cath 05/2014: mild LAD disease, normal LCx and RCA. Risk factor modification recommended.   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)    MSLT 01/09/09 & MRI brain 01/09/09   Pericarditis    recurrent   POLYNEUROPATHY    PULMONARY SARCOIDOSIS    Mediastinal lymphadenopathy with biospy proven sarcodosis   Seizures (HCC)    "none in 4-5 years; don't know what kind; not related to alcohol" (04/13/2016)   TESTICULAR HYPOFUNCTION    TIA (transient ischemic attack)    "I don't remember when" (04/13/2016)    Tobacco History: Social History   Tobacco Use  Smoking Status Former   Current packs/day: 0.00   Average packs/day: 1 pack/day for 10.0 years (10.0 ttl  pk-yrs)   Types: Cigarettes   Start date: 05/19/1999   Quit date: 05/18/2009   Years since quitting: 14.4   Passive exposure: Past  Smokeless Tobacco Never   Counseling given: Not Answered   Outpatient Medications Prior to Visit  Medication Sig Dispense Refill   Albuterol -Budesonide  (AIRSUPRA ) 90-80 MCG/ACT AERO Inhale 2 puffs into the lungs every 4 (four) hours as needed. 10.7 g 1   allopurinol  (ZYLOPRIM ) 300 MG tablet TAKE 1 TABLET(300 MG) BY MOUTH DAILY 90 tablet 1   aspirin  EC 81 MG tablet Take 1 tablet (81 mg total) by mouth daily. 30 tablet 3   Blood Glucose Monitoring Suppl (ONETOUCH VERIO REFLECT) w/Device KIT Use as advised 1 kit 0   cetirizine (ZYRTEC) 10 MG tablet Take 10 mg by mouth daily.     cholecalciferol  (VITAMIN D ) 1000 units tablet Take 1,000 Units by mouth daily.     clomiPHENE (CLOMID) 50 MG tablet Take 50 mg by mouth every other day.     colchicine  0.6 MG tablet Take 1 tablet (0.6 mg total) by mouth 2 (two) times daily. 180 tablet 0   Continuous Glucose Sensor (DEXCOM G7 SENSOR) MISC 1 Device by Does not apply route continuous. 9 each 0   docusate sodium  (COLACE) 100 MG capsule Take 100 mg by mouth daily.     Empagliflozin -metFORMIN  HCl ER (SYNJARDY  XR) 25-1000 MG TB24 Take 1 tablet by mouth daily. 90 tablet 3   fluticasone  (FLONASE ) 50 MCG/ACT nasal spray Place 2 sprays into both nostrils daily. 16 g 6   folic acid  (FOLVITE ) 1 MG tablet Take 1 mg by mouth daily.  1   glucose blood test strip Use as instructed 1x a day 100 each 3   ipratropium-albuterol  (DUONEB) 0.5-2.5 (3) MG/3ML SOLN Take 3 mLs by nebulization every 4 (four) hours as needed. 360 mL 1   metoprolol  succinate (TOPROL -XL) 50 MG 24 hr tablet Take 50 mg by mouth daily. Take with or immediately following a meal.     nitroGLYCERIN  (NITROSTAT ) 0.4 MG SL tablet Place 1 tablet (0.4 mg total) under the tongue every 5 (five) minutes as needed for chest pain. 25 tablet 3   ondansetron  (ZOFRAN -ODT) 4 MG  disintegrating tablet Take 1 tablet (4 mg total) by mouth every  8 (eight) hours as needed for nausea or vomiting. 20 tablet 0   OneTouch Delica Lancets 33G MISC Use 1x a day 100 each 1   pantoprazole  (PROTONIX ) 40 MG tablet TAKE 2 TABLETS(80 MG) BY MOUTH DAILY 180 tablet 0   rosuvastatin  (CRESTOR ) 40 MG tablet TAKE 1 TAB BY MOUTH DAILY 90 tablet 1   sacubitril-valsartan  (ENTRESTO) 49-51 MG Take 1 tablet by mouth 2 (two) times daily. Via NOVARTIS pt assistance     saw palmetto  500 MG capsule Take 500 mg by mouth daily.     Semaglutide  (RYBELSUS ) 7 MG TABS Take 1 tablet (7 mg total) by mouth daily. 90 tablet 3   tadalafil  (CIALIS ) 20 MG tablet TAKE ONE TABLET BY MOUTH DAILY AS NEEDED 20 tablet 11   Zinc Sulfate (ZINC 15 PO) Take 1 tablet by mouth daily.     No facility-administered medications prior to visit.   Review of Systems  Review of Systems  Constitutional: Negative.   HENT: Negative.    Respiratory:  Positive for cough, shortness of breath and wheezing.   Cardiovascular: Negative.     Physical Exam  BP 132/80 (BP Location: Left Arm, Patient Position: Sitting, Cuff Size: Large)   Pulse 80   Temp 98.5 F (36.9 C) (Oral)   Ht 6\' 3"  (1.905 m)   Wt 253 lb 6.4 oz (114.9 kg)   SpO2 100%   BMI 31.67 kg/m  Physical Exam Constitutional:      Appearance: Normal appearance. He is obese. He is not ill-appearing.  HENT:     Head: Normocephalic and atraumatic.  Cardiovascular:     Rate and Rhythm: Normal rate and regular rhythm.  Pulmonary:     Effort: Pulmonary effort is normal.     Breath sounds: Normal breath sounds. No wheezing, rhonchi or rales.     Comments: CTA Musculoskeletal:        General: Normal range of motion.  Skin:    General: Skin is warm and dry.  Neurological:     General: No focal deficit present.     Mental Status: He is alert and oriented to person, place, and time. Mental status is at baseline.  Psychiatric:        Mood and Affect: Mood normal.         Behavior: Behavior normal.        Thought Content: Thought content normal.        Judgment: Judgment normal.      Lab Results:  CBC    Component Value Date/Time   WBC 5.9 10/18/2023 1541   RBC 5.74 10/18/2023 1541   HGB 15.4 10/18/2023 1541   HCT 45.6 10/18/2023 1541   PLT 294 10/18/2023 1541   MCV 79.4 (L) 10/18/2023 1541   MCH 26.8 10/18/2023 1541   MCHC 33.8 10/18/2023 1541   RDW 13.5 10/18/2023 1541   LYMPHSABS 2.4 10/12/2023 1425   MONOABS 0.6 10/12/2023 1425   EOSABS 0.2 10/12/2023 1425   BASOSABS 0.0 10/12/2023 1425    BMET    Component Value Date/Time   NA 140 10/18/2023 1541   NA 141 06/17/2016 0745   K 3.8 10/18/2023 1541   CL 100 10/18/2023 1541   CO2 28 10/18/2023 1541   GLUCOSE 111 (H) 10/18/2023 1541   BUN 12 10/18/2023 1541   BUN 11 06/17/2016 0745   CREATININE 0.77 10/18/2023 1541   CREATININE 0.91 04/07/2016 1529   CALCIUM  10.1 10/18/2023 1541   GFRNONAA >60 10/18/2023 1541  GFRAA >60 02/11/2020 1954    BNP No results found for: "BNP"  ProBNP    Component Value Date/Time   PROBNP 42.1 06/24/2010 1520    Imaging: DG Chest 2 View Result Date: 10/18/2023 CLINICAL DATA:  Left arm throbbing with fatigue, shortness of breath and nausea. EXAM: CHEST - 2 VIEW COMPARISON:  September 19, 2017 FINDINGS: The heart size and mediastinal contours are within normal limits. Both lungs are clear. Radiopaque surgical clips are seen within the right upper quadrant. The visualized skeletal structures are unremarkable. IMPRESSION: No active cardiopulmonary disease. Electronically Signed   By: Virgle Grime M.D.   On: 10/18/2023 19:01     Assessment & Plan:   1. Sarcoidosis (Primary) - Angiotensin converting enzyme; Future - Angiotensin converting enzyme  2. Chronic obstructive pulmonary disease, unspecified COPD type (HCC)  Assessment and Plan    Chronic Obstructive Pulmonary Disease (COPD) COPD symptoms have worsened since November, with increased  dyspnea, cough, and wheezing. Lung function improved to 65% from 28% last year. Current use of Airsupra  (albuterol  budesonide ) every four hours as needed indicates inadequate control. Trelegy was effective but not covered by insurance. Discussed alternative maintenance inhalers such as Spiriva  and Advair, which may be preferred by insurance. Emphasized need for maintenance inhaler for long-term control. Airsupra  is short-acting and not ideal for long-term management. Discussed benefits of long-acting bronchodilators and steroids. Cardiac evaluation is necessary given recent myocardial infarction. - Send message to pharmacist for benefits investigation for Advair, Spiriva , and Trelegy. - Provide Trelegy 100mcg samples. - Schedule follow-up in 3 months with Dr. Bertrum Brodie.  Sarcoidosis of lung - Sarcoidosis diagnosed in 2010. No active signs on recent CT scan or chest x-ray. Plan to check ACE level as a marker for sarcoidosis   Myocardial infarction Recent emergency room visit revealed myocardial infarction. Scheduled for nuclear stress test and echocardiogram with cardiologist. - Proceed with scheduled nuclear stress test and echocardiogram with cardiologist.  Antonio Baumgarten, NP 10/21/2023

## 2023-10-21 NOTE — Telephone Encounter (Signed)
 We are unable to do test claims on prescriptions for this patient as their insurance plan is not contracted or covered through [employer]

## 2023-10-21 NOTE — Assessment & Plan Note (Signed)
 As reviewed below with symptoms of unstable angina, proceed with cardiac catheterization for further evaluation. Continue with aspirin  81 mg once daily and rosuvastatin  40 mg once daily.

## 2023-10-21 NOTE — Assessment & Plan Note (Signed)
 Lipid panel 10/12/2022 LDL 49, HDL 47, total cholesterol 161, triglycerides 106.  Optimal. Continue rosuvastatin  40 mg once daily.

## 2023-10-21 NOTE — Patient Instructions (Addendum)
-  CHRONIC OBSTRUCTIVE PULMONARY DISEASE (COPD): COPD is a chronic lung disease that causes obstructed airflow from the lungs. Your symptoms have worsened since November, and your current inhaler, Airsupra , is not providing adequate relief. We discussed alternative maintenance inhalers like Spiriva  and Advair, which may be covered by your insurance. These long-acting inhalers are better for long-term control. We will send a message to the pharmacist to check the benefits for these inhalers and provide you with a month's worth of samples. Please schedule a follow-up in 3 months with Dr. Bertrum Brodie.  -SARCOIDOSIS OF LUNG: Sarcoidosis is an inflammatory disease that affects multiple organs, particularly the lungs. Your recent imaging shows no active signs of sarcoidosis, but your lung function test indicates a restrictive pattern consistent with the disease. We will check your ACE level to assess the activity of sarcoidosis.  -MYOCARDIAL INFARCTION: A myocardial infarction, or heart attack, occurs when blood flow to the heart is blocked. You recently learned of a past heart attack and are scheduled for a nuclear stress test and an echocardiogram with a cardiologist. Please proceed with these scheduled tests. Please follow up with the scheduled nuclear stress test and echocardiogram with your cardiologist.   Rx: Trelegy sample - take one puffs daily in the morning (rinse mouth)   Follow-up: Please schedule visit in 3 months with Dr. Bertrum Brodie to review your COPD management and any new developments.

## 2023-10-21 NOTE — Assessment & Plan Note (Signed)
 Symptoms and presentation concerning for unstable angina. Alternate differential diagnosis would be uncontrolled blood pressure, arrhythmias, pulmonary causes.  However given his substrate of nonobstructive coronary artery disease, risk factors and short duration of change in functional status and ongoing symptoms high suspicion for unstable angina.  Continue with the aspirin  81 mg once daily Continue with rosuvastatin  40 mg once daily.  Discussed with him further evaluation with cardiac catheterization. Alternative would be cardiac CT coronary angiogram. Given the acuity of the symptoms would recommend invasive workup.   Shared Decision Making/Informed Consent{ The risks [stroke (1 in 1000), death (1 in 1000), kidney failure [usually temporary] (1 in 500), bleeding (1 in 200), allergic reaction [possibly serious] (1 in 200)], benefits (diagnostic support and management of coronary artery disease) and alternatives of a cardiac catheterization were discussed in detail with him and he is willing to proceed. He has good medical literacy and having had coronary angiogram done before he is aware of the potential risk benefits.  Proceed with scheduling cardiac cath next available tentatively at [facility].  He is aware to call 911 or go to the nearest ER if symptoms acutely worsen.

## 2023-10-21 NOTE — Patient Instructions (Signed)
Spiro and DLCO performed today. 

## 2023-10-21 NOTE — H&P (View-Only) (Signed)
 Okay to  Cardiology Consultation:    Date:  10/21/2023   ID:  [name], DOB 1970/09/05, MRN [id number]  PCP:  Arcadio Knuckles, MD  Cardiologist:  Daymon Evans Jemaine Prokop, MD   Referring MD: Carin Charleston, MD   Chief Complaint  Patient presents with   Chest Pain     ASSESSMENT AND PLAN:   [name] 53 year old male with history of nonobstructive CAD on 05/2014, hypertension, hyperlipidemia, diabetes mellitus type 2, rheumatoid arthritis, pulmonary sarcoidosis, former smoker [quit smoking 2010; smoked from age 11 until 2010] . Lexiscan  stress test with nuclear imaging April 2021 low risk study without ischemia, LVEF 47%. Echocardiogram at the time 10/11/2019 with LVEF 55 to 60% normal wall motion, no significant valve abnormalities. Last echocardiogram available to review 10/29/2022 with normal biventricular function LVEF 60 to 65%, no wall motion abnormality, no significant valve abnormalities.  With ongoing chest pain and shortness of breath symptoms for the last 1 week unremarkable workup in the ER with negative troponins schedule for outpatient echocardiogram and stress test [stress test is scheduled for tomorrow].  Problem List Items Addressed This Visit     Hyperlipidemia with target LDL less than 70   Lipid panel 10/12/2022 LDL 49, HDL 47, total cholesterol 409, triglycerides 106.  Optimal. Continue rosuvastatin  40 mg once daily.       Relevant Medications   spironolactone (ALDACTONE) 25 MG tablet   metoprolol  succinate (TOPROL -XL) 100 MG 24 hr tablet   sacubitril-valsartan  (ENTRESTO) 97-103 MG   Other Relevant Orders   CBC   Basic Metabolic Panel (BMET)   Essential hypertension   Suboptimal. Discontinue testosterone  while cardiac workup is pending. Continue rest of his medications including metoprolol  succinate, spironolactone. Titrate up Entresto to 97 mg - 103 mg twice daily.  If blood pressures remain uncontrolled to add amlodipine .       Relevant  Medications   spironolactone (ALDACTONE) 25 MG tablet   metoprolol  succinate (TOPROL -XL) 100 MG 24 hr tablet   sacubitril-valsartan  (ENTRESTO) 97-103 MG   Other Relevant Orders   EKG 12-Lead (Completed)   CBC   Basic Metabolic Panel (BMET)   CAD (coronary artery disease)   As reviewed below with symptoms of unstable angina, proceed with cardiac catheterization for further evaluation. Continue with aspirin  81 mg once daily and rosuvastatin  40 mg once daily.       Relevant Medications   spironolactone (ALDACTONE) 25 MG tablet   metoprolol  succinate (TOPROL -XL) 100 MG 24 hr tablet   sacubitril-valsartan  (ENTRESTO) 97-103 MG   Other Relevant Orders   CBC   Basic Metabolic Panel (BMET)   Unstable angina (HCC) - Primary   Symptoms and presentation concerning for unstable angina. Alternate differential diagnosis would be uncontrolled blood pressure, arrhythmias, pulmonary causes.  However given his substrate of nonobstructive coronary artery disease, risk factors and short duration of change in functional status and ongoing symptoms high suspicion for unstable angina.  Continue with the aspirin  81 mg once daily Continue with rosuvastatin  40 mg once daily.  Discussed with him further evaluation with cardiac catheterization. Alternative would be cardiac CT coronary angiogram. Given the acuity of the symptoms would recommend invasive workup.   Shared Decision Making/Informed Consent{ The risks [stroke (1 in 1000), death (1 in 1000), kidney failure [usually temporary] (1 in 500), bleeding (1 in 200), allergic reaction [possibly serious] (1 in 200)], benefits (diagnostic support and management of coronary artery disease) and alternatives of a cardiac catheterization were discussed in detail with him  and he is willing to proceed. He has good medical literacy and having had coronary angiogram done before he is aware of the potential risk benefits.  Proceed with scheduling cardiac cath next  available tentatively at [facility].  He is aware to call 911 or go to the nearest ER if symptoms acutely worsen.          Relevant Medications   spironolactone (ALDACTONE) 25 MG tablet   metoprolol  succinate (TOPROL -XL) 100 MG 24 hr tablet   sacubitril-valsartan  (ENTRESTO) 97-103 MG   Other Relevant Orders   CBC   Basic Metabolic Panel (BMET)   Return to clinic tentatively in 3 to 4 weeks.   History of Present Illness:    [name] is a 53 y.o. male who is being seen today for the evaluation of chest pain at the request of Carin Charleston, MD. Previous visit with cardiology in our records was 06-20-2019 with [name]. Pleasant man here for the visit by himself.  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  With history of nonobstructive CAD on 05/2014, hypertension, hyperlipidemia, diabetes mellitus type 2, rheumatoid arthritis, pulmonary sarcoidosis, former smoker [quit smoking 2010; smoked from age 45 until 2010] . Lexiscan  stress test with nuclear imaging April 2021 low risk study without ischemia, LVEF 47%. Echocardiogram at the time 10/11/2019 with LVEF 55 to 60% normal wall motion, no significant valve abnormalities. Last echocardiogram available to review 10/29/2022 with normal biventricular function LVEF 60 to 65%, no wall motion abnormality, no significant valve abnormalities.  Was emergency room 10-18-2023 for chest discomfort, fatigue and dyspnea on exertion started over the weekend.  Blood work in the ER was unremarkable with troponins.  EKG showed J-point elevation consistent with his prior EKGs.  Symptoms improved with morphine  and Zofran .  He was discharged to follow-up with cardiology as outpatient and then echocardiogram and stress test scheduled to be done as an outpatient.  Mentions over the past week he had been dealing with symptoms of chest pressure and shortness of breath with exertion associated with epigastric discomfort extending into the  retrosternal area and to the back.  With ongoing frequent symptoms of this he went to the ER for further evaluation.  Symptoms continue to occur.  Even walking up to the appointment today he felt significant symptoms.  He also describes occasional symptoms of palpitations which he describes as random occurring of skipped beat or an extra heartbeat.  Denies any fluttering in the chest or rapid heartbeat.  Denies any syncopal or near syncopal episodes.  Denies any pedal edema. Blood pressures recently uncontrolled and PCP is titrated up medications. Recently started testosterone  injections about 2 months ago. Clomiphene citrate was discontinued after his testosterone  supplements were initiated.  Former smoker, quit in 2010. Drinks alcohol 1-2 times per week.  Last lipid panel reviewed from 10/12/2022 total cholesterol 118, HDL 47, LDL 49, triglycerides 106.  Optimal. CMP 10-18-2023 unremarkable with BUN 12, creatinine 0.77, EGFR greater than 60. Normal transaminases and alkaline phosphatase. CBC unremarkable. High-sensitivity troponin T less than 15 and less than 15. D-dimer was unremarkable 0.34.  EKG in the clinic today shows sinus rhythm heart rate 90/min, PR interval 200 ms.  ST segment changes appears consistent with early repolarization.  No significant change in comparison to prior EKGs   Past Medical History:  Diagnosis Date   Anginal pain (HCC)    Arthritis    ? 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 Followed by Dr. Meredith Stalls   Arthritis    "  ankles" (04/13/2016)   Asthma    CORONARY ARTERY DISEASE    a. cath 05/2014: mild LAD disease, normal LCx and RCA. Risk factor modification recommended.   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)    MSLT 01/09/09 & MRI brain 01/09/09   Pericarditis    recurrent   POLYNEUROPATHY     PULMONARY SARCOIDOSIS    Mediastinal lymphadenopathy with biospy proven sarcodosis   Seizures (HCC)    "none in 4-5 years; don't know what kind; not related to alcohol" (04/13/2016)   TESTICULAR HYPOFUNCTION    TIA (transient ischemic attack)    "I don't remember when" (04/13/2016)    Past Surgical History:  Procedure Laterality Date   BRONCHOSCOPY  08/21/08   CARDIAC CATHETERIZATION  06/25/2009   minimal disease   CARDIAC CATHETERIZATION N/A 04/14/2016   Procedure: Left Heart Cath and Coronary Angiography;  Surgeon: Odie Benne, MD;  Location: [facility] INVASIVE CV LAB;  Service: Cardiovascular;  Laterality: N/A;   CHOLECYSTECTOMY N/A 09/21/2017   Procedure: LAPAROSCOPIC CHOLECYSTECTOMY WITH INTRAOPERATIVE CHOLANGIOGRAM;  Surgeon: Ayesha Lente, MD;  Location: [facility] ORS;  Service: General;  Laterality: N/A;   LEFT HEART CATHETERIZATION WITH CORONARY ANGIOGRAM N/A 05/18/2014   Procedure: LEFT HEART CATHETERIZATION WITH CORONARY ANGIOGRAM;  Surgeon: Lucendia Rusk, MD;  Location: [facility] CATH LAB;  Service: Cardiovascular;  Laterality: N/A;   MEDIASTINOSCOPY  11/30/08    Current Medications: Current Meds  Medication Sig   Albuterol -Budesonide  (AIRSUPRA ) 90-80 MCG/ACT AERO Inhale 2 puffs into the lungs every 4 (four) hours as needed.   allopurinol  (ZYLOPRIM ) 300 MG tablet TAKE 1 TABLET(300 MG) BY MOUTH DAILY   aspirin  EC 81 MG tablet Take 1 tablet (81 mg total) by mouth daily.   Blood Glucose Monitoring Suppl (ONETOUCH VERIO REFLECT) w/Device KIT Use as advised   cetirizine (ZYRTEC) 10 MG tablet Take 10 mg by mouth daily.   cholecalciferol  (VITAMIN D ) 1000 units tablet Take 1,000 Units by mouth daily.   clomiPHENE (CLOMID) 50 MG tablet Take 50 mg by mouth every other day.   colchicine  0.6 MG tablet Take 1 tablet (0.6 mg total) by mouth 2 (two) times daily.   Continuous Glucose Sensor (DEXCOM G7 SENSOR) MISC 1 Device by Does not apply route continuous.   docusate sodium  (COLACE) 100 MG  capsule Take 100 mg by mouth daily.   Empagliflozin -metFORMIN  HCl ER (SYNJARDY  XR) 25-1000 MG TB24 Take 1 tablet by mouth daily.   fluticasone  (FLONASE ) 50 MCG/ACT nasal spray Place 2 sprays into both nostrils daily.   Fluticasone -Umeclidin-Vilant (TRELEGY ELLIPTA ) 100-62.5-25 MCG/ACT AEPB Inhale 1 puff into the lungs daily.   folic acid  (FOLVITE ) 1 MG tablet Take 1 mg by mouth daily.   glucose blood test strip Use as instructed 1x a day   ipratropium-albuterol  (DUONEB) 0.5-2.5 (3) MG/3ML SOLN Take 3 mLs by nebulization every 4 (four) hours as needed.   metoprolol  succinate (TOPROL -XL) 100 MG 24 hr tablet Take 100 mg by mouth daily.   nitroGLYCERIN  (NITROSTAT ) 0.4 MG SL tablet Place 1 tablet (0.4 mg total) under the tongue every 5 (five) minutes as needed for chest pain.   ondansetron  (ZOFRAN -ODT) 4 MG disintegrating tablet Take 1 tablet (4 mg total) by mouth every 8 (eight) hours as needed for nausea or vomiting.   OneTouch Delica  Lancets 33G MISC Use 1x a day   pantoprazole  (PROTONIX ) 40 MG tablet TAKE 2 TABLETS(80 MG) BY MOUTH DAILY   rosuvastatin  (CRESTOR ) 40 MG tablet TAKE 1 TAB BY MOUTH DAILY   sacubitril-valsartan  (ENTRESTO) 97-103 MG Take 1 tablet by mouth 2 (two) times daily.   saw palmetto  500 MG capsule Take 500 mg by mouth daily.   Semaglutide  (RYBELSUS ) 7 MG TABS Take 1 tablet (7 mg total) by mouth daily.   spironolactone (ALDACTONE) 25 MG tablet Take 25 mg by mouth daily.   tadalafil  (CIALIS ) 20 MG tablet TAKE ONE TABLET BY MOUTH DAILY AS NEEDED   testosterone  cypionate (DEPOTESTOSTERONE CYPIONATE) 200 MG/ML injection Inject 200 mg into the muscle every 7 (seven) days.   Zinc Sulfate (ZINC 15 PO) Take 1 tablet by mouth daily.   [DISCONTINUED] metoprolol  succinate (TOPROL -XL) 50 MG 24 hr tablet Take 50 mg by mouth daily. Take with or immediately following a meal.   [DISCONTINUED] sacubitril-valsartan  (ENTRESTO) 49-51 MG Take 1 tablet by mouth 2 (two) times daily. Via [facility] pt  assistance     Allergies:   Amlodipine , Sulfamethoxazole, and Sulfonamide derivatives   Social History   Socioeconomic History   Marital status: Significant Other    Spouse name: Not on file   Number of children: 3   Years of education: Not on file   Highest education level: Not on file  Occupational History   Occupation: Unemployed  Tobacco Use   Smoking status: Former    Current packs/day: 0.00    Average packs/day: 1 pack/day for 10.0 years (10.0 ttl pk-yrs)    Types: Cigarettes    Start date: 05/19/1999    Quit date: 05/18/2009    Years since quitting: 14.4    Passive exposure: Past   Smokeless tobacco: Never  Vaping Use   Vaping status: Never Used  Substance and Sexual Activity   Alcohol use: Yes    Comment: rare   Drug use: No   Sexual activity: Yes  Other Topics Concern   Not on file  Social History Narrative   Married, lives with wife and 3 kids.    Currently student. 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 [employer]   Social Drivers of [employer]: Low Risk  (06/23/2022)   Overall Financial Resource Strain (CARDIA)    Difficulty of Paying Living Expenses: Not hard at all  Food Insecurity: No Food Insecurity (06/23/2022)   Hunger Vital Sign    Worried About Running Out of Food in the Last Year: Never true    Ran Out of Food in the Last Year: Never true  Transportation Needs: No Transportation Needs (06/23/2022)   PRAPARE - Administrator, Civil Service (Medical): No    Lack of Transportation (Non-Medical): No  Physical Activity: Sufficiently Active (06/23/2022)   Exercise Vital Sign    Days of Exercise per Week: 7 days    Minutes of Exercise per Session: 30 min  Stress: Stress Concern Present (06/23/2022)   [employer] of Occupational Health - Occupational Stress Questionnaire    Feeling of Stress : To some extent  Social Connections: Socially Integrated (06/23/2022)   Social Connection and Isolation Panel [NHANES]     Frequency of Communication with Friends and Family: More than three times a week    Frequency of Social Gatherings with Friends and Family: More than three times a week    Attends Religious Services: More than 4 times per year  Active Member of Clubs or Organizations: No    Attends Engineer, structural: More than 4 times per year    Marital Status: Living with partner     Family History: The patient's family history includes Colon polyps in his father and mother; Diabetes in his father and mother; Heart disease (age of onset: 62) in his father; Heart failure in his father; Hypertension in his mother; Pancreatic cancer in his maternal grandmother and maternal uncle; Stomach cancer in his maternal uncle. There is no history of Colon cancer, Esophageal cancer, Rectal cancer, or Liver disease. ROS:   Please see the history of present illness.    All 14 point review of systems negative except as described per history of present illness.  EKGs/Labs/Other Studies Reviewed:    The following studies were reviewed today:   EKG:  EKG Interpretation Date/Time:  Thursday Oct 21 2023 13:46:01 EDT Ventricular Rate:  90 PR Interval:  200 QRS Duration:  82 QT Interval:  348 QTC Calculation: 425 R Axis:   5  Text Interpretation: Normal sinus rhythm ST elevation, consider early repolarization When compared with ECG of 18-Oct-2023 15:41, No significant change was found Confirmed by Bertha Broad reddy [phone]) on 10/21/2023 2:07:30 PM    Recent Labs: 10/12/2023: TSH 1.01 10/18/2023: ALT 18; BUN 12; Creatinine, Ser 0.77; Hemoglobin 15.4; Platelets 294; Potassium 3.8; Sodium 140  Recent Lipid Panel    Component Value Date/Time   CHOL 118 10/12/2023 1425   CHOL 83 (L) 09/20/2019 0746   TRIG 106.0 10/12/2023 1425   TRIG 90 06/25/2009 0000   HDL 47.80 10/12/2023 1425   HDL 32 (L) 09/20/2019 0746   CHOLHDL 2 10/12/2023 1425   VLDL 21.2 10/12/2023 1425   LDLCALC 49 10/12/2023 1425    LDLCALC 37 09/20/2019 0746    Physical Exam:    VS:  BP (!) 150/90   Pulse 90   Ht 6\' 3"  (1.905 m)   Wt 252 lb (114.3 kg)   SpO2 96%   BMI 31.50 kg/m     Wt Readings from Last 3 Encounters:  10/21/23 252 lb (114.3 kg)  10/21/23 253 lb 6.4 oz (114.9 kg)  10/18/23 250 lb (113.4 kg)     GENERAL:  Well nourished, well developed in no acute distress NECK: No JVD; No carotid bruits CARDIAC: RRR, S1 and S2 present, no murmurs, no rubs, no gallops CHEST:  Clear to auscultation without rales, wheezing or rhonchi  Extremities: No pitting pedal edema. Pulses bilaterally symmetric with radial 2+ and dorsalis pedis 2+ NEUROLOGIC:  Alert and oriented x 3  Medication Adjustments/Labs and Tests Ordered: Current medicines are reviewed at length with the patient today.  Concerns regarding medicines are outlined above.  Orders Placed This Encounter  Procedures   CBC   Basic Metabolic Panel (BMET)   EKG 12-Lead   Meds ordered this encounter  Medications   sacubitril-valsartan  (ENTRESTO) 97-103 MG    Sig: Take 1 tablet by mouth 2 (two) times daily.    Dispense:  180 tablet    Refill:  3    Signed, Thomson Herbers reddy Kessie Croston, MD, MPH, [facility]. 10/21/2023 3:36 PM    [facility]

## 2023-10-21 NOTE — Progress Notes (Signed)
 Okay to  Cardiology Consultation:    Date:  10/21/2023   ID:  [name], DOB 1970/09/05, MRN [id number]  PCP:  Arcadio Knuckles, MD  Cardiologist:  Daymon Evans Jemaine Prokop, MD   Referring MD: Carin Charleston, MD   Chief Complaint  Patient presents with   Chest Pain     ASSESSMENT AND PLAN:   [name] 53 year old male with history of nonobstructive CAD on 05/2014, hypertension, hyperlipidemia, diabetes mellitus type 2, rheumatoid arthritis, pulmonary sarcoidosis, former smoker [quit smoking 2010; smoked from age 11 until 2010] . Lexiscan  stress test with nuclear imaging April 2021 low risk study without ischemia, LVEF 47%. Echocardiogram at the time 10/11/2019 with LVEF 55 to 60% normal wall motion, no significant valve abnormalities. Last echocardiogram available to review 10/29/2022 with normal biventricular function LVEF 60 to 65%, no wall motion abnormality, no significant valve abnormalities.  With ongoing chest pain and shortness of breath symptoms for the last 1 week unremarkable workup in the ER with negative troponins schedule for outpatient echocardiogram and stress test [stress test is scheduled for tomorrow].  Problem List Items Addressed This Visit     Hyperlipidemia with target LDL less than 70   Lipid panel 10/12/2022 LDL 49, HDL 47, total cholesterol 409, triglycerides 106.  Optimal. Continue rosuvastatin  40 mg once daily.       Relevant Medications   spironolactone (ALDACTONE) 25 MG tablet   metoprolol  succinate (TOPROL -XL) 100 MG 24 hr tablet   sacubitril-valsartan  (ENTRESTO) 97-103 MG   Other Relevant Orders   CBC   Basic Metabolic Panel (BMET)   Essential hypertension   Suboptimal. Discontinue testosterone  while cardiac workup is pending. Continue rest of his medications including metoprolol  succinate, spironolactone. Titrate up Entresto to 97 mg - 103 mg twice daily.  If blood pressures remain uncontrolled to add amlodipine .       Relevant  Medications   spironolactone (ALDACTONE) 25 MG tablet   metoprolol  succinate (TOPROL -XL) 100 MG 24 hr tablet   sacubitril-valsartan  (ENTRESTO) 97-103 MG   Other Relevant Orders   EKG 12-Lead (Completed)   CBC   Basic Metabolic Panel (BMET)   CAD (coronary artery disease)   As reviewed below with symptoms of unstable angina, proceed with cardiac catheterization for further evaluation. Continue with aspirin  81 mg once daily and rosuvastatin  40 mg once daily.       Relevant Medications   spironolactone (ALDACTONE) 25 MG tablet   metoprolol  succinate (TOPROL -XL) 100 MG 24 hr tablet   sacubitril-valsartan  (ENTRESTO) 97-103 MG   Other Relevant Orders   CBC   Basic Metabolic Panel (BMET)   Unstable angina (HCC) - Primary   Symptoms and presentation concerning for unstable angina. Alternate differential diagnosis would be uncontrolled blood pressure, arrhythmias, pulmonary causes.  However given his substrate of nonobstructive coronary artery disease, risk factors and short duration of change in functional status and ongoing symptoms high suspicion for unstable angina.  Continue with the aspirin  81 mg once daily Continue with rosuvastatin  40 mg once daily.  Discussed with him further evaluation with cardiac catheterization. Alternative would be cardiac CT coronary angiogram. Given the acuity of the symptoms would recommend invasive workup.   Shared Decision Making/Informed Consent{ The risks [stroke (1 in 1000), death (1 in 1000), kidney failure [usually temporary] (1 in 500), bleeding (1 in 200), allergic reaction [possibly serious] (1 in 200)], benefits (diagnostic support and management of coronary artery disease) and alternatives of a cardiac catheterization were discussed in detail with him  and he is willing to proceed. He has good medical literacy and having had coronary angiogram done before he is aware of the potential risk benefits.  Proceed with scheduling cardiac cath next  available tentatively at [facility].  He is aware to call 911 or go to the nearest ER if symptoms acutely worsen.          Relevant Medications   spironolactone (ALDACTONE) 25 MG tablet   metoprolol  succinate (TOPROL -XL) 100 MG 24 hr tablet   sacubitril-valsartan  (ENTRESTO) 97-103 MG   Other Relevant Orders   CBC   Basic Metabolic Panel (BMET)   Return to clinic tentatively in 3 to 4 weeks.   History of Present Illness:    [name] is a 53 y.o. male who is being seen today for the evaluation of chest pain at the request of Carin Charleston, MD. Previous visit with cardiology in our records was 06-20-2019 with [name]. Pleasant man here for the visit by himself.  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  With history of nonobstructive CAD on 05/2014, hypertension, hyperlipidemia, diabetes mellitus type 2, rheumatoid arthritis, pulmonary sarcoidosis, former smoker [quit smoking 2010; smoked from age 45 until 2010] . Lexiscan  stress test with nuclear imaging April 2021 low risk study without ischemia, LVEF 47%. Echocardiogram at the time 10/11/2019 with LVEF 55 to 60% normal wall motion, no significant valve abnormalities. Last echocardiogram available to review 10/29/2022 with normal biventricular function LVEF 60 to 65%, no wall motion abnormality, no significant valve abnormalities.  Was emergency room 10-18-2023 for chest discomfort, fatigue and dyspnea on exertion started over the weekend.  Blood work in the ER was unremarkable with troponins.  EKG showed J-point elevation consistent with his prior EKGs.  Symptoms improved with morphine  and Zofran .  He was discharged to follow-up with cardiology as outpatient and then echocardiogram and stress test scheduled to be done as an outpatient.  Mentions over the past week he had been dealing with symptoms of chest pressure and shortness of breath with exertion associated with epigastric discomfort extending into the  retrosternal area and to the back.  With ongoing frequent symptoms of this he went to the ER for further evaluation.  Symptoms continue to occur.  Even walking up to the appointment today he felt significant symptoms.  He also describes occasional symptoms of palpitations which he describes as random occurring of skipped beat or an extra heartbeat.  Denies any fluttering in the chest or rapid heartbeat.  Denies any syncopal or near syncopal episodes.  Denies any pedal edema. Blood pressures recently uncontrolled and PCP is titrated up medications. Recently started testosterone  injections about 2 months ago. Clomiphene citrate was discontinued after his testosterone  supplements were initiated.  Former smoker, quit in 2010. Drinks alcohol 1-2 times per week.  Last lipid panel reviewed from 10/12/2022 total cholesterol 118, HDL 47, LDL 49, triglycerides 106.  Optimal. CMP 10-18-2023 unremarkable with BUN 12, creatinine 0.77, EGFR greater than 60. Normal transaminases and alkaline phosphatase. CBC unremarkable. High-sensitivity troponin T less than 15 and less than 15. D-dimer was unremarkable 0.34.  EKG in the clinic today shows sinus rhythm heart rate 90/min, PR interval 200 ms.  ST segment changes appears consistent with early repolarization.  No significant change in comparison to prior EKGs   Past Medical History:  Diagnosis Date   Anginal pain (HCC)    Arthritis    ? 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 Followed by Dr. Meredith Stalls   Arthritis    "  ankles" (04/13/2016)   Asthma    CORONARY ARTERY DISEASE    a. cath 05/2014: mild LAD disease, normal LCx and RCA. Risk factor modification recommended.   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)    MSLT 01/09/09 & MRI brain 01/09/09   Pericarditis    recurrent   POLYNEUROPATHY     PULMONARY SARCOIDOSIS    Mediastinal lymphadenopathy with biospy proven sarcodosis   Seizures (HCC)    "none in 4-5 years; don't know what kind; not related to alcohol" (04/13/2016)   TESTICULAR HYPOFUNCTION    TIA (transient ischemic attack)    "I don't remember when" (04/13/2016)    Past Surgical History:  Procedure Laterality Date   BRONCHOSCOPY  08/21/08   CARDIAC CATHETERIZATION  06/25/2009   minimal disease   CARDIAC CATHETERIZATION N/A 04/14/2016   Procedure: Left Heart Cath and Coronary Angiography;  Surgeon: Odie Benne, MD;  Location: [facility] INVASIVE CV LAB;  Service: Cardiovascular;  Laterality: N/A;   CHOLECYSTECTOMY N/A 09/21/2017   Procedure: LAPAROSCOPIC CHOLECYSTECTOMY WITH INTRAOPERATIVE CHOLANGIOGRAM;  Surgeon: Ayesha Lente, MD;  Location: [facility] ORS;  Service: General;  Laterality: N/A;   LEFT HEART CATHETERIZATION WITH CORONARY ANGIOGRAM N/A 05/18/2014   Procedure: LEFT HEART CATHETERIZATION WITH CORONARY ANGIOGRAM;  Surgeon: Lucendia Rusk, MD;  Location: [facility] CATH LAB;  Service: Cardiovascular;  Laterality: N/A;   MEDIASTINOSCOPY  11/30/08    Current Medications: Current Meds  Medication Sig   Albuterol -Budesonide  (AIRSUPRA ) 90-80 MCG/ACT AERO Inhale 2 puffs into the lungs every 4 (four) hours as needed.   allopurinol  (ZYLOPRIM ) 300 MG tablet TAKE 1 TABLET(300 MG) BY MOUTH DAILY   aspirin  EC 81 MG tablet Take 1 tablet (81 mg total) by mouth daily.   Blood Glucose Monitoring Suppl (ONETOUCH VERIO REFLECT) w/Device KIT Use as advised   cetirizine (ZYRTEC) 10 MG tablet Take 10 mg by mouth daily.   cholecalciferol  (VITAMIN D ) 1000 units tablet Take 1,000 Units by mouth daily.   clomiPHENE (CLOMID) 50 MG tablet Take 50 mg by mouth every other day.   colchicine  0.6 MG tablet Take 1 tablet (0.6 mg total) by mouth 2 (two) times daily.   Continuous Glucose Sensor (DEXCOM G7 SENSOR) MISC 1 Device by Does not apply route continuous.   docusate sodium  (COLACE) 100 MG  capsule Take 100 mg by mouth daily.   Empagliflozin -metFORMIN  HCl ER (SYNJARDY  XR) 25-1000 MG TB24 Take 1 tablet by mouth daily.   fluticasone  (FLONASE ) 50 MCG/ACT nasal spray Place 2 sprays into both nostrils daily.   Fluticasone -Umeclidin-Vilant (TRELEGY ELLIPTA ) 100-62.5-25 MCG/ACT AEPB Inhale 1 puff into the lungs daily.   folic acid  (FOLVITE ) 1 MG tablet Take 1 mg by mouth daily.   glucose blood test strip Use as instructed 1x a day   ipratropium-albuterol  (DUONEB) 0.5-2.5 (3) MG/3ML SOLN Take 3 mLs by nebulization every 4 (four) hours as needed.   metoprolol  succinate (TOPROL -XL) 100 MG 24 hr tablet Take 100 mg by mouth daily.   nitroGLYCERIN  (NITROSTAT ) 0.4 MG SL tablet Place 1 tablet (0.4 mg total) under the tongue every 5 (five) minutes as needed for chest pain.   ondansetron  (ZOFRAN -ODT) 4 MG disintegrating tablet Take 1 tablet (4 mg total) by mouth every 8 (eight) hours as needed for nausea or vomiting.   OneTouch Delica  Lancets 33G MISC Use 1x a day   pantoprazole  (PROTONIX ) 40 MG tablet TAKE 2 TABLETS(80 MG) BY MOUTH DAILY   rosuvastatin  (CRESTOR ) 40 MG tablet TAKE 1 TAB BY MOUTH DAILY   sacubitril-valsartan  (ENTRESTO) 97-103 MG Take 1 tablet by mouth 2 (two) times daily.   saw palmetto  500 MG capsule Take 500 mg by mouth daily.   Semaglutide  (RYBELSUS ) 7 MG TABS Take 1 tablet (7 mg total) by mouth daily.   spironolactone (ALDACTONE) 25 MG tablet Take 25 mg by mouth daily.   tadalafil  (CIALIS ) 20 MG tablet TAKE ONE TABLET BY MOUTH DAILY AS NEEDED   testosterone  cypionate (DEPOTESTOSTERONE CYPIONATE) 200 MG/ML injection Inject 200 mg into the muscle every 7 (seven) days.   Zinc Sulfate (ZINC 15 PO) Take 1 tablet by mouth daily.   [DISCONTINUED] metoprolol  succinate (TOPROL -XL) 50 MG 24 hr tablet Take 50 mg by mouth daily. Take with or immediately following a meal.   [DISCONTINUED] sacubitril-valsartan  (ENTRESTO) 49-51 MG Take 1 tablet by mouth 2 (two) times daily. Via NOVARTIS pt  assistance     Allergies:   Amlodipine , Sulfamethoxazole, and Sulfonamide derivatives   Social History   Socioeconomic History   Marital status: Significant Other    Spouse name: Not on file   Number of children: 3   Years of education: Not on file   Highest education level: Not on file  Occupational History   Occupation: Unemployed  Tobacco Use   Smoking status: Former    Current packs/day: 0.00    Average packs/day: 1 pack/day for 10.0 years (10.0 ttl pk-yrs)    Types: Cigarettes    Start date: 05/19/1999    Quit date: 05/18/2009    Years since quitting: 14.4    Passive exposure: Past   Smokeless tobacco: Never  Vaping Use   Vaping status: Never Used  Substance and Sexual Activity   Alcohol use: Yes    Comment: rare   Drug use: No   Sexual activity: Yes  Other Topics Concern   Not on file  Social History Narrative   Married, lives with wife and 3 kids.    Currently student. 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 [employer]   Social Drivers of [employer]: Low Risk  (06/23/2022)   Overall Financial Resource Strain (CARDIA)    Difficulty of Paying Living Expenses: Not hard at all  Food Insecurity: No Food Insecurity (06/23/2022)   Hunger Vital Sign    Worried About Running Out of Food in the Last Year: Never true    Ran Out of Food in the Last Year: Never true  Transportation Needs: No Transportation Needs (06/23/2022)   PRAPARE - Administrator, Civil Service (Medical): No    Lack of Transportation (Non-Medical): No  Physical Activity: Sufficiently Active (06/23/2022)   Exercise Vital Sign    Days of Exercise per Week: 7 days    Minutes of Exercise per Session: 30 min  Stress: Stress Concern Present (06/23/2022)   [employer] of Occupational Health - Occupational Stress Questionnaire    Feeling of Stress : To some extent  Social Connections: Socially Integrated (06/23/2022)   Social Connection and Isolation Panel [NHANES]     Frequency of Communication with Friends and Family: More than three times a week    Frequency of Social Gatherings with Friends and Family: More than three times a week    Attends Religious Services: More than 4 times per year  Active Member of Clubs or Organizations: No    Attends Engineer, structural: More than 4 times per year    Marital Status: Living with partner     Family History: The patient's family history includes Colon polyps in his father and mother; Diabetes in his father and mother; Heart disease (age of onset: 62) in his father; Heart failure in his father; Hypertension in his mother; Pancreatic cancer in his maternal grandmother and maternal uncle; Stomach cancer in his maternal uncle. There is no history of Colon cancer, Esophageal cancer, Rectal cancer, or Liver disease. ROS:   Please see the history of present illness.    All 14 point review of systems negative except as described per history of present illness.  EKGs/Labs/Other Studies Reviewed:    The following studies were reviewed today:   EKG:  EKG Interpretation Date/Time:  Thursday Oct 21 2023 13:46:01 EDT Ventricular Rate:  90 PR Interval:  200 QRS Duration:  82 QT Interval:  348 QTC Calculation: 425 R Axis:   5  Text Interpretation: Normal sinus rhythm ST elevation, consider early repolarization When compared with ECG of 18-Oct-2023 15:41, No significant change was found Confirmed by Bertha Broad reddy [phone]) on 10/21/2023 2:07:30 PM    Recent Labs: 10/12/2023: TSH 1.01 10/18/2023: ALT 18; BUN 12; Creatinine, Ser 0.77; Hemoglobin 15.4; Platelets 294; Potassium 3.8; Sodium 140  Recent Lipid Panel    Component Value Date/Time   CHOL 118 10/12/2023 1425   CHOL 83 (L) 09/20/2019 0746   TRIG 106.0 10/12/2023 1425   TRIG 90 06/25/2009 0000   HDL 47.80 10/12/2023 1425   HDL 32 (L) 09/20/2019 0746   CHOLHDL 2 10/12/2023 1425   VLDL 21.2 10/12/2023 1425   LDLCALC 49 10/12/2023 1425    LDLCALC 37 09/20/2019 0746    Physical Exam:    VS:  BP (!) 150/90   Pulse 90   Ht 6\' 3"  (1.905 m)   Wt 252 lb (114.3 kg)   SpO2 96%   BMI 31.50 kg/m     Wt Readings from Last 3 Encounters:  10/21/23 252 lb (114.3 kg)  10/21/23 253 lb 6.4 oz (114.9 kg)  10/18/23 250 lb (113.4 kg)     GENERAL:  Well nourished, well developed in no acute distress NECK: No JVD; No carotid bruits CARDIAC: RRR, S1 and S2 present, no murmurs, no rubs, no gallops CHEST:  Clear to auscultation without rales, wheezing or rhonchi  Extremities: No pitting pedal edema. Pulses bilaterally symmetric with radial 2+ and dorsalis pedis 2+ NEUROLOGIC:  Alert and oriented x 3  Medication Adjustments/Labs and Tests Ordered: Current medicines are reviewed at length with the patient today.  Concerns regarding medicines are outlined above.  Orders Placed This Encounter  Procedures   CBC   Basic Metabolic Panel (BMET)   EKG 12-Lead   Meds ordered this encounter  Medications   sacubitril-valsartan  (ENTRESTO) 97-103 MG    Sig: Take 1 tablet by mouth 2 (two) times daily.    Dispense:  180 tablet    Refill:  3    Signed, Thomson Herbers reddy Kessie Croston, MD, MPH, [facility]. 10/21/2023 3:36 PM    [facility]

## 2023-10-21 NOTE — Progress Notes (Signed)
Spiro and DLCO performed today. 

## 2023-10-21 NOTE — Telephone Encounter (Signed)
 Can you do a benefit investigation for Advair and spiriva ? OR TRELEGY

## 2023-10-21 NOTE — Assessment & Plan Note (Signed)
 Suboptimal. Discontinue testosterone  while cardiac workup is pending. Continue rest of his medications including metoprolol  succinate, spironolactone. Titrate up Entresto to 97 mg - 103 mg twice daily.  If blood pressures remain uncontrolled to add amlodipine .

## 2023-10-22 ENCOUNTER — Ambulatory Visit: Payer: Self-pay | Admitting: Primary Care

## 2023-10-22 ENCOUNTER — Ambulatory Visit (HOSPITAL_COMMUNITY): Admission: RE | Admit: 2023-10-22 | Source: Ambulatory Visit

## 2023-10-22 ENCOUNTER — Encounter (HOSPITAL_COMMUNITY): Payer: Self-pay

## 2023-10-22 LAB — CBC
Hematocrit: 50.9 % (ref 37.5–51.0)
Hemoglobin: 15.7 g/dL (ref 13.0–17.7)
MCH: 25.9 pg — ABNORMAL LOW (ref 26.6–33.0)
MCHC: 30.8 g/dL — ABNORMAL LOW (ref 31.5–35.7)
MCV: 84 fL (ref 79–97)
Platelets: 332 10*3/uL (ref 150–450)
RBC: 6.06 x10E6/uL — ABNORMAL HIGH (ref 4.14–5.80)
RDW: 14.4 % (ref 11.6–15.4)
WBC: 6 10*3/uL (ref 3.4–10.8)

## 2023-10-22 LAB — BASIC METABOLIC PANEL WITH GFR
BUN/Creatinine Ratio: 16 (ref 9–20)
BUN: 13 mg/dL (ref 6–24)
CO2: 24 mmol/L (ref 20–29)
Calcium: 10.6 mg/dL — ABNORMAL HIGH (ref 8.7–10.2)
Chloride: 99 mmol/L (ref 96–106)
Creatinine, Ser: 0.8 mg/dL (ref 0.76–1.27)
Glucose: 135 mg/dL — ABNORMAL HIGH (ref 70–99)
Potassium: 4 mmol/L (ref 3.5–5.2)
Sodium: 138 mmol/L (ref 134–144)
eGFR: 108 mL/min/{1.73_m2} (ref >59)

## 2023-10-22 LAB — ANGIOTENSIN CONVERTING ENZYME: Angiotensin-Converting Enzyme: 21 U/L (ref 9–67)

## 2023-10-25 ENCOUNTER — Telehealth: Payer: Self-pay | Admitting: *Deleted

## 2023-10-25 ENCOUNTER — Ambulatory Visit: Admitting: Family Medicine

## 2023-10-25 ENCOUNTER — Encounter: Payer: Self-pay | Admitting: Family Medicine

## 2023-10-25 VITALS — BP 130/88 | HR 90 | Temp 98.4°F | Ht 75.0 in | Wt 265.0 lb

## 2023-10-25 DIAGNOSIS — R351 Nocturia: Secondary | ICD-10-CM | POA: Diagnosis not present

## 2023-10-25 DIAGNOSIS — G479 Sleep disorder, unspecified: Secondary | ICD-10-CM | POA: Diagnosis not present

## 2023-10-25 DIAGNOSIS — N401 Enlarged prostate with lower urinary tract symptoms: Secondary | ICD-10-CM

## 2023-10-25 MED ORDER — OXYBUTYNIN CHLORIDE ER 10 MG PO TB24: 10.0000 mg | ORAL_TABLET | Freq: Every day | ORAL | 1 refills | Status: DC

## 2023-10-25 NOTE — Telephone Encounter (Signed)
 Cardiac Catheterization scheduled at [facility] for: Tuesday Oct 26, 2023 7:30 AM Arrival time [facility] Main Entrance A at: 5:30 AM  Nothing to eat after midnight prior to procedure, clear liquids until 5 AM day of procedure.  Medication instructions: -Hold:  Synjardy -day of procedure and 48 hours post procedure  Rybelsus /Spironolactone-AM of procedure -Other usual morning medications can be taken with sips of water including aspirin  81 mg.  Plan to go home the same day, you will only stay overnight if medically necessary.  You must have responsible adult to drive you home.  Someone must be with you the first 24 hours after you arrive home.  Reviewed procedure instructions with patient.

## 2023-10-25 NOTE — Progress Notes (Signed)
 Acute Office Visit  Subjective:     Patient ID: [name], male    DOB: December 18, 1970, 53 y.o.   MRN: [id number]  Chief Complaint  Patient presents with   Acute Visit    Discuss sleep, having trouble staying asleep. Has tried Melatonin    HPI Patient is in today for evaluation of trouble staying asleep. Reports that he has to get up to urinate 2-4 times at night. Does have history of BPH. States that after he gets up the first time about 1 AM, he cannot go back to sleep. Has not taken any thing at night to help with urinary frequency. Denies any trouble falling asleep, snoring, gasping for breath, other outside interruptions to sleep.   ROS Per HPI      Objective:    BP 130/88 (BP Location: Left Arm, Patient Position: Sitting)   Pulse 90   Temp 98.4 F (36.9 C) (Temporal)   Ht 6\' 3"  (1.905 m)   Wt 265 lb (120.2 kg)   SpO2 95%   BMI 33.12 kg/m    Physical Exam Vitals and nursing note reviewed.  Constitutional:      General: He is not in acute distress.    Appearance: Normal appearance.  HENT:     Head: Normocephalic and atraumatic.     Right Ear: External ear normal.     Left Ear: External ear normal.  Eyes:     Extraocular Movements: Extraocular movements intact.  Cardiovascular:     Rate and Rhythm: Normal rate and regular rhythm.     Pulses: Normal pulses.     Heart sounds: Normal heart sounds.  Pulmonary:     Effort: Pulmonary effort is normal. No respiratory distress.     Breath sounds: Normal breath sounds. No wheezing, rhonchi or rales.  Musculoskeletal:        General: Normal range of motion.     Cervical back: Normal range of motion.     Right lower leg: No edema.     Left lower leg: No edema.  Lymphadenopathy:     Cervical: No cervical adenopathy.  Skin:    General: Skin is warm and dry.  Neurological:     General: No focal deficit present.     Mental Status: He is alert and oriented to person, place, and time.  Psychiatric:         Mood and Affect: Mood normal.        Behavior: Behavior normal.     No results found for any visits on 10/25/23.      Assessment & Plan:   BPH with lower urinary tract symptoms without urinary obstruction -     oxyBUTYnin  Chloride ER; Take 1 tablet (10 mg total) by mouth at bedtime.  Dispense: 30 tablet; Refill: 1  Nocturia -     oxyBUTYnin  Chloride ER; Take 1 tablet (10 mg total) by mouth at bedtime.  Dispense: 30 tablet; Refill: 1  Sleep trouble -     oxyBUTYnin  Chloride ER; Take 1 tablet (10 mg total) by mouth at bedtime.  Dispense: 30 tablet; Refill: 1     Meds ordered this encounter  Medications   oxybutynin  (DITROPAN -XL) 10 MG 24 hr tablet    Sig: Take 1 tablet (10 mg total) by mouth at bedtime.    Dispense:  30 tablet    Refill:  1    Return in about 4 weeks (around 11/22/2023) for As needed if not helpful with nocturia.  Trevor Fudge  Antionette Bath, FNP

## 2023-10-25 NOTE — Patient Instructions (Signed)
 I have sent in Ditropan  for you to take 1 tablet once daily in the evenings.  I am hoping this will help decrease the amount of urinary frequency at night so that you can stay asleep.  If this is working well for you, we would be happy to refill this.  If this is not working well for you, follow-up with me in about a month and we will reevaluate.

## 2023-10-26 ENCOUNTER — Encounter (HOSPITAL_COMMUNITY)
Admission: RE | Disposition: A | Discharge status: Home / Self Care | Payer: Self-pay | Source: Home / Self Care | Attending: Cardiology

## 2023-10-26 ENCOUNTER — Ambulatory Visit (HOSPITAL_COMMUNITY)
Admission: RE | Admit: 2023-10-26 | Discharge: 2023-10-26 | Disposition: A | Discharge status: Home / Self Care | Attending: Cardiology | Admitting: Cardiology

## 2023-10-26 ENCOUNTER — Other Ambulatory Visit: Payer: Self-pay

## 2023-10-26 DIAGNOSIS — Z87891 Personal history of nicotine dependence: Secondary | ICD-10-CM | POA: Diagnosis not present

## 2023-10-26 DIAGNOSIS — I10 Essential (primary) hypertension: Secondary | ICD-10-CM | POA: Diagnosis not present

## 2023-10-26 DIAGNOSIS — I200 Unstable angina: Secondary | ICD-10-CM | POA: Diagnosis present

## 2023-10-26 DIAGNOSIS — Z79899 Other long term (current) drug therapy: Secondary | ICD-10-CM | POA: Insufficient documentation

## 2023-10-26 DIAGNOSIS — E785 Hyperlipidemia, unspecified: Secondary | ICD-10-CM | POA: Insufficient documentation

## 2023-10-26 DIAGNOSIS — I25110 Atherosclerotic heart disease of native coronary artery with unstable angina pectoris: Secondary | ICD-10-CM

## 2023-10-26 HISTORY — PX: LEFT HEART CATH AND CORONARY ANGIOGRAPHY: CATH118249

## 2023-10-26 LAB — GLUCOSE, CAPILLARY: Glucose-Capillary: 140 mg/dL — ABNORMAL HIGH (ref 70–99)

## 2023-10-26 SURGERY — LEFT HEART CATH AND CORONARY ANGIOGRAPHY
Anesthesia: LOCAL

## 2023-10-26 MED ORDER — HEPARIN (PORCINE) IN NACL 1000-0.9 UT/500ML-% IV SOLN
INTRAVENOUS | Status: DC | PRN
  Administered 2023-10-26: 1000 mL

## 2023-10-26 MED ORDER — LABETALOL HCL 5 MG/ML IV SOLN: 10.0000 mg | INTRAVENOUS | Status: DC | PRN

## 2023-10-26 MED ORDER — MORPHINE SULFATE (PF) 2 MG/ML IV SOLN: 2.0000 mg | INTRAVENOUS | Status: DC | PRN

## 2023-10-26 MED ORDER — HYDRALAZINE HCL 20 MG/ML IJ SOLN: 10.0000 mg | INTRAMUSCULAR | Status: DC | PRN

## 2023-10-26 MED ORDER — SODIUM CHLORIDE 0.9 % WEIGHT BASED INFUSION
3.0000 mL/kg/h | INTRAVENOUS | Status: AC
Start: 2023-10-26 — End: 2023-10-26

## 2023-10-26 MED ORDER — HEPARIN SODIUM (PORCINE) 1000 UNIT/ML IJ SOLN
INTRAMUSCULAR | Status: AC
  Filled 2023-10-26: qty 10

## 2023-10-26 MED ORDER — SODIUM CHLORIDE 0.9% FLUSH
3.0000 mL | Freq: Two times a day (BID) | INTRAVENOUS | Status: DC
Start: 2023-10-26 — End: 2023-10-26

## 2023-10-26 MED ORDER — HEPARIN SODIUM (PORCINE) 1000 UNIT/ML IJ SOLN
INTRAMUSCULAR | Status: DC | PRN
  Administered 2023-10-26: 6000 [IU] via INTRAVENOUS

## 2023-10-26 MED ORDER — ACETAMINOPHEN 325 MG PO TABS: 650.0000 mg | ORAL_TABLET | ORAL | Status: DC | PRN

## 2023-10-26 MED ORDER — ASPIRIN 81 MG PO CHEW: 81.0000 mg | CHEWABLE_TABLET | Freq: Once | ORAL | Status: DC

## 2023-10-26 MED ORDER — SODIUM CHLORIDE 0.9 % WEIGHT BASED INFUSION: 1.0000 mL/kg/h | INTRAVENOUS | Status: DC

## 2023-10-26 MED ORDER — MIDAZOLAM HCL 2 MG/2ML IJ SOLN
INTRAMUSCULAR | Status: DC | PRN
  Administered 2023-10-26: 1 mg via INTRAVENOUS

## 2023-10-26 MED ORDER — SODIUM CHLORIDE 0.9% FLUSH: 3.0000 mL | INTRAVENOUS | Status: DC | PRN

## 2023-10-26 MED ORDER — MIDAZOLAM HCL 2 MG/2ML IJ SOLN
INTRAMUSCULAR | Status: AC
  Filled 2023-10-26: qty 2

## 2023-10-26 MED ORDER — LIDOCAINE HCL (PF) 1 % IJ SOLN
INTRAMUSCULAR | Status: DC | PRN
  Administered 2023-10-26: 2 mL

## 2023-10-26 MED ORDER — FENTANYL CITRATE (PF) 100 MCG/2ML IJ SOLN
INTRAMUSCULAR | Status: DC | PRN
  Administered 2023-10-26: 25 ug via INTRAVENOUS

## 2023-10-26 MED ORDER — FENTANYL CITRATE (PF) 100 MCG/2ML IJ SOLN
INTRAMUSCULAR | Status: AC
  Filled 2023-10-26: qty 2

## 2023-10-26 MED ORDER — VERAPAMIL HCL 2.5 MG/ML IV SOLN
INTRAVENOUS | Status: AC
  Filled 2023-10-26: qty 2

## 2023-10-26 MED ORDER — LIDOCAINE HCL (PF) 1 % IJ SOLN
INTRAMUSCULAR | Status: AC
  Filled 2023-10-26: qty 30

## 2023-10-26 MED ORDER — ONDANSETRON HCL 4 MG/2ML IJ SOLN: 4.0000 mg | Freq: Four times a day (QID) | INTRAMUSCULAR | Status: DC | PRN

## 2023-10-26 MED ORDER — VERAPAMIL HCL 2.5 MG/ML IV SOLN
INTRAVENOUS | Status: DC | PRN
  Administered 2023-10-26: 10 mL via INTRA_ARTERIAL

## 2023-10-26 SURGICAL SUPPLY — 9 items
CATH INFINITI 5FR ANG PIGTAIL (CATHETERS) IMPLANT
CATH INFINITI AMBI 5FR TG (CATHETERS) IMPLANT
COVER PRB 48X5XTLSCP FOLD TPE (BAG) IMPLANT
DEVICE RAD COMP TR BAND LRG (VASCULAR PRODUCTS) IMPLANT
GLIDESHEATH SLEND SS 6F .021 (SHEATH) IMPLANT
GUIDEWIRE INQWIRE 1.5J.035X260 (WIRE) IMPLANT
KIT SYRINGE INJ CVI SPIKEX1 (MISCELLANEOUS) IMPLANT
PACK CARDIAC CATHETERIZATION (CUSTOM PROCEDURE TRAY) ×1 IMPLANT
SET ATX-X65L (MISCELLANEOUS) IMPLANT

## 2023-10-26 NOTE — Brief Op Note (Signed)
   Brief Cardiac Catheterization Note  NAME: OSMAN CALZADILLA DOB: 1970-06-25  MRN: [id number] 10/26/2023 8:15 AM  PCP:  Arcadio Knuckles, MD   Cardiologist:  Daymon Evans Madireddy, MD   SURGEON:  Surgeons and Role:   * Arleen Lacer, MD - Primary  PROCEDURE:  Procedure(s): LEFT HEART CATH AND CORONARY ANGIOGRAPHY (N/A)   PATIENT:  DANFORD TAT  53 y.o. male with history nonobstructive CAD by cath in December 2015 with hypertension, hyperlipidemia and DM-2 and rheumatoid thrice as well as pulmonary sarcoidosis who was referred by Dr. Ronell Coe for evaluation of chest pain concerning for progressive/unstable angina.  PRE-OPERATIVE DIAGNOSIS:  unstable angina  POST-OPERATIVE DIAGNOSIS:   Angiographically minimal CAD-similar to 2015 LVEF 60 to 65% with no RWMA.  Normal LVEDP`   PROCEDURE PERFORMED: Time Out: Verified patient identification, verified procedure, site/side was marked, verified correct patient position, special equipment/implants available, medications/allergies/relevent history reviewed, required imaging and test results available. Performed.  Access:  RIGHT Radial Artery: 6 Fr sheath -- Seldinger technique using Micropuncture Kit -- Direct ultrasound guidance used.  Permanent image obtained and placed on chart. -- 10 mL radial cocktail IA; 6000 Units IV Heparin .  Left Heart Catheterization: 5Fr Catheters advanced or exchanged over a J-wire under direct fluoroscopic guidance into the ascending aorta; TIG 4.0 catheter advanced first.  * Left & Right Coronary Artery Cineangiography: TIG 4.0 catheter  * LV Hemodynamics (LV Gram): Angled pigtail catheter  Review of initial angiography revealed: Normal coronary arteries  Preparations are made for medical management  Upon completion of Angiogaphy, the catheter was removed completely out of the body over a wire, without complication.  Radial sheath removed in the Cardiac Catheterization lab with TR Band placed for  hemostasis.  TR Band: 0810  Hours; 13 mL air; reverse Barbeau a  MEDICATIONS SQ Lidocaine  4 mL Radial Cocktail: 3 mg Verapmil in 10 mL NS Heparin : 60 units Contrast 65 mL  ANESTHESIA:   local and IV sedation; 1 mg Versed , 25 mg fentanyl   EBL:  < 20 mL   COUNTS:  YES  PATIENT DISPOSITION:  PACU - hemodynamically stable.  DICTATION: .Note written in EPIC  PLAN OF CARE: Discharge to home after PACU; Evaluate for noncardiac etiology for chest pain.   Randene Bustard, MD

## 2023-10-26 NOTE — Interval H&P Note (Signed)
 History and Physical Interval Note:  10/26/2023 7:26 AM  [name]  has presented today for surgery, with the diagnosis of unstable angina.  The various methods of treatment have been discussed with the patient and family. After consideration of risks, benefits and other options for treatment, the patient has consented to  Procedure(s): LEFT HEART CATH AND CORONARY ANGIOGRAPHY (N/A)  PERCUTANEOUS CORONARY INTERVENTION  as a surgical intervention.  The patient's history has been reviewed, patient examined, no change in status, stable for surgery.  I have reviewed the patient's chart and labs.  Questions were answered to the patient's satisfaction.    Cath Lab Visit (complete for each Cath Lab visit)  Clinical Evaluation Leading to the Procedure:   ACS: No.  Non-ACS:    Anginal Classification: CCS III  Anti-ischemic medical therapy: Minimal Therapy (1 class of medications)  Non-Invasive Test Results: No non-invasive testing performed  Prior CABG: No previous CABG   Randene Bustard

## 2023-10-26 NOTE — Discharge Instructions (Signed)

## 2023-10-27 ENCOUNTER — Encounter (HOSPITAL_COMMUNITY): Payer: Self-pay | Admitting: Cardiology

## 2023-11-04 ENCOUNTER — Ambulatory Visit (HOSPITAL_COMMUNITY)
Admission: RE | Admit: 2023-11-04 | Discharge: 2023-11-04 | Disposition: A | Discharge status: Home / Self Care | Source: Ambulatory Visit | Attending: Cardiology | Admitting: Cardiology

## 2023-11-04 DIAGNOSIS — I5030 Unspecified diastolic (congestive) heart failure: Secondary | ICD-10-CM | POA: Diagnosis present

## 2023-11-04 DIAGNOSIS — I358 Other nonrheumatic aortic valve disorders: Secondary | ICD-10-CM

## 2023-11-04 LAB — ECHOCARDIOGRAM COMPLETE
Area-P 1/2: 4.06 cm2
S' Lateral: 2.8 cm

## 2023-11-09 ENCOUNTER — Other Ambulatory Visit (HOSPITAL_BASED_OUTPATIENT_CLINIC_OR_DEPARTMENT_OTHER)

## 2023-11-10 ENCOUNTER — Encounter: Payer: Self-pay | Admitting: Internal Medicine

## 2023-11-10 ENCOUNTER — Ambulatory Visit: Payer: Self-pay

## 2023-11-10 ENCOUNTER — Ambulatory Visit (INDEPENDENT_AMBULATORY_CARE_PROVIDER_SITE_OTHER): Admitting: Internal Medicine

## 2023-11-10 VITALS — BP 138/80 | HR 85 | Temp 98.2°F | Ht 75.0 in | Wt 260.0 lb

## 2023-11-10 DIAGNOSIS — E1169 Type 2 diabetes mellitus with other specified complication: Secondary | ICD-10-CM | POA: Diagnosis not present

## 2023-11-10 DIAGNOSIS — R051 Acute cough: Secondary | ICD-10-CM

## 2023-11-10 DIAGNOSIS — R059 Cough, unspecified: Secondary | ICD-10-CM | POA: Insufficient documentation

## 2023-11-10 DIAGNOSIS — Z7984 Long term (current) use of oral hypoglycemic drugs: Secondary | ICD-10-CM

## 2023-11-10 DIAGNOSIS — R062 Wheezing: Secondary | ICD-10-CM | POA: Diagnosis not present

## 2023-11-10 DIAGNOSIS — I10 Essential (primary) hypertension: Secondary | ICD-10-CM | POA: Diagnosis not present

## 2023-11-10 DIAGNOSIS — E669 Obesity, unspecified: Secondary | ICD-10-CM

## 2023-11-10 MED ORDER — PREDNISONE 10 MG PO TABS: ORAL_TABLET | ORAL | 0 refills | Status: DC

## 2023-11-10 MED ORDER — AZITHROMYCIN 250 MG PO TABS: ORAL_TABLET | ORAL | 1 refills | Status: AC

## 2023-11-10 MED ORDER — HYDROCODONE BIT-HOMATROP MBR 5-1.5 MG/5ML PO SOLN: 5.0000 mL | Freq: Four times a day (QID) | ORAL | 0 refills | Status: AC | PRN

## 2023-11-10 NOTE — Assessment & Plan Note (Signed)
 BP Readings from Last 3 Encounters:  11/10/23 138/80  10/26/23 (!) 136/90  10/25/23 130/88   Stable, pt to continue medical treatment toprol  xl 100 qd

## 2023-11-10 NOTE — Patient Instructions (Signed)
 Please take all new medication as prescribed  - the antibiotic, cough medicine, and prednisone   Please continue all other medications as before, including the inhaler as you do  Please have the pharmacy call with any other refills you may need.  Please keep your appointments with your specialists as you may have planned

## 2023-11-10 NOTE — Progress Notes (Signed)
 Patient ID: [name], male   DOB: September 25, 1970, 53 y.o.   MRN: [id number]        Chief Complaint: follow up cough, wheezing, htn, dm       HPI:  [name] is a 53 y.o. male Here with acute onset mild to mod 2-3 days ST, HA, general weakness and malaise, with prod cough greenish sputum, but Pt denies chest pain, increased sob or doe, wheezing, orthopnea, PND, increased LE swelling, palpitations, dizziness or syncope, except for sob wheezing since last pm.   Pt denies polydipsia, polyuria, or new focal neuro s/s.          Wt Readings from Last 3 Encounters:  11/10/23 260 lb (117.9 kg)  10/26/23 254 lb (115.2 kg)  10/25/23 265 lb (120.2 kg)   BP Readings from Last 3 Encounters:  11/10/23 138/80  10/26/23 (!) 136/90  10/25/23 130/88         Past Medical History:  Diagnosis Date   Anginal pain (HCC)    Arthritis    ? 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 Followed by Dr. Meredith Stalls   Arthritis    "ankles" (04/13/2016)   Asthma    CORONARY ARTERY DISEASE    a. cath 05/2014: mild LAD disease, normal LCx and RCA. Risk factor modification recommended.   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)    MSLT 01/09/09 & MRI brain 01/09/09   Pericarditis    recurrent   POLYNEUROPATHY    PULMONARY SARCOIDOSIS    Mediastinal lymphadenopathy with biospy proven sarcodosis   Seizures (HCC)    "none in 4-5 years; don't know what kind; not related to alcohol" (04/13/2016)   TESTICULAR HYPOFUNCTION    TIA (transient ischemic attack)    "I don't remember when" (04/13/2016)   Past Surgical History:  Procedure Laterality Date   BRONCHOSCOPY  08/21/08   CARDIAC CATHETERIZATION  06/25/2009   minimal disease   CARDIAC CATHETERIZATION N/A 04/14/2016   Procedure: Left Heart Cath and Coronary Angiography;  Surgeon: Odie Benne, MD;   Location: [facility] INVASIVE CV LAB;  Service: Cardiovascular;  Laterality: N/A;   CHOLECYSTECTOMY N/A 09/21/2017   Procedure: LAPAROSCOPIC CHOLECYSTECTOMY WITH INTRAOPERATIVE CHOLANGIOGRAM;  Surgeon: Ayesha Lente, MD;  Location: [facility] ORS;  Service: General;  Laterality: N/A;   LEFT HEART CATH AND CORONARY ANGIOGRAPHY N/A 10/26/2023   Procedure: LEFT HEART CATH AND CORONARY ANGIOGRAPHY;  Surgeon: Arleen Lacer, MD;  Location: [facility] INVASIVE CV LAB;  Service: Cardiovascular;  Laterality: N/A;   LEFT HEART CATHETERIZATION WITH CORONARY ANGIOGRAM N/A 05/18/2014   Procedure: LEFT HEART CATHETERIZATION WITH CORONARY ANGIOGRAM;  Surgeon: Lucendia Rusk, MD;  Location: [facility] CATH LAB;  Service: Cardiovascular;  Laterality: N/A;   MEDIASTINOSCOPY  11/30/08    reports that he quit smoking about 14 years ago. His smoking use included cigarettes. He started smoking about 24 years ago. He has a 10 pack-year smoking history. He has been exposed to tobacco smoke. He has never used smokeless tobacco. He reports current alcohol use. He reports that he does not use drugs. family history includes Colon polyps in his father and mother; Diabetes in his father and mother; Heart attack in his father; Heart disease (age of onset: 23) in his father; Heart failure in his father; Hypertension in his  mother; Pancreatic cancer in his maternal grandmother and maternal uncle; Stomach cancer in his maternal uncle. Allergies  Allergen Reactions   Amlodipine  Other (See Comments)    Ankle swelling with 10mg , ok with 5mg  dose   Sulfamethoxazole Rash    fever   Sulfonamide Derivatives Rash and Other (See Comments)   Current Outpatient Medications on File Prior to Visit  Medication Sig Dispense Refill   Albuterol -Budesonide  (AIRSUPRA ) 90-80 MCG/ACT AERO Inhale 2 puffs into the lungs every 4 (four) hours as needed. 10.7 g 1   allopurinol  (ZYLOPRIM ) 300 MG tablet TAKE 1 TABLET(300 MG) BY MOUTH DAILY 90 tablet 1   aspirin  EC 81 MG  tablet Take 1 tablet (81 mg total) by mouth daily. 30 tablet 3   Blood Glucose Monitoring Suppl (ONETOUCH VERIO REFLECT) w/Device KIT Use as advised 1 kit 0   cetirizine (ZYRTEC) 10 MG tablet Take 10 mg by mouth daily.     cholecalciferol  (VITAMIN D ) 1000 units tablet Take 1,000 Units by mouth daily.     colchicine  0.6 MG tablet Take 1 tablet (0.6 mg total) by mouth 2 (two) times daily. (Patient taking differently: Take 0.6 mg by mouth daily as needed (gout).) 180 tablet 0   Continuous Glucose Sensor (DEXCOM G7 SENSOR) MISC 1 Device by Does not apply route continuous. 9 each 0   docusate sodium  (COLACE) 100 MG capsule Take 100 mg by mouth daily.     Empagliflozin -metFORMIN  HCl ER (SYNJARDY  XR) 25-1000 MG TB24 Take 1 tablet by mouth daily. 90 tablet 3   fluticasone  (FLONASE ) 50 MCG/ACT nasal spray Place 2 sprays into both nostrils daily. (Patient taking differently: Place 2 sprays into both nostrils daily as needed for allergies.) 16 g 6   Fluticasone -Umeclidin-Vilant (TRELEGY ELLIPTA ) 100-62.5-25 MCG/ACT AEPB Inhale 1 puff into the lungs daily.     glucose blood test strip Use as instructed 1x a day 100 each 3   ipratropium-albuterol  (DUONEB) 0.5-2.5 (3) MG/3ML SOLN Take 3 mLs by nebulization every 4 (four) hours as needed. 360 mL 1   metoprolol  succinate (TOPROL -XL) 100 MG 24 hr tablet Take 100 mg by mouth daily.     nitroGLYCERIN  (NITROSTAT ) 0.4 MG SL tablet Place 1 tablet (0.4 mg total) under the tongue every 5 (five) minutes as needed for chest pain. 25 tablet 3   ondansetron  (ZOFRAN -ODT) 4 MG disintegrating tablet Take 1 tablet (4 mg total) by mouth every 8 (eight) hours as needed for nausea or vomiting. 20 tablet 0   OneTouch Delica Lancets 33G MISC Use 1x a day 100 each 1   oxybutynin  (DITROPAN -XL) 10 MG 24 hr tablet Take 1 tablet (10 mg total) by mouth at bedtime. 30 tablet 1   pantoprazole  (PROTONIX ) 40 MG tablet TAKE 2 TABLETS(80 MG) BY MOUTH DAILY 180 tablet 0   rosuvastatin  (CRESTOR ) 40  MG tablet TAKE 1 TAB BY MOUTH DAILY 90 tablet 1   sacubitril-valsartan  (ENTRESTO ) 97-103 MG Take 1 tablet by mouth 2 (two) times daily. 180 tablet 3   saw palmetto  500 MG capsule Take 500 mg by mouth daily.     Semaglutide  (RYBELSUS ) 7 MG TABS Take 1 tablet (7 mg total) by mouth daily. 90 tablet 3   spironolactone (ALDACTONE) 25 MG tablet Take 25 mg by mouth daily.     tadalafil  (CIALIS ) 20 MG tablet TAKE ONE TABLET BY MOUTH DAILY AS NEEDED 20 tablet 11   testosterone  cypionate (DEPOTESTOSTERONE CYPIONATE) 200 MG/ML injection Inject 200 mg into the muscle every 7 (seven) days.  Zinc Sulfate (ZINC 15 PO) Take 1 tablet by mouth daily.     No current facility-administered medications on file prior to visit.        ROS:  All others reviewed and negative.  Objective        PE:  BP 138/80 (BP Location: Right Arm, Patient Position: Sitting, Cuff Size: Normal)   Pulse 85   Temp 98.2 F (36.8 C) (Oral)   Ht 6\' 3"  (1.905 m)   Wt 260 lb (117.9 kg)   SpO2 98%   BMI 32.50 kg/m                 Constitutional: Pt appears in NAD               HENT: Head: NCAT.                Right Ear: External ear normal.                 Left Ear: External ear normal.                Eyes: . Pupils are equal, round, and reactive to light. Conjunctivae and EOM are normal               Nose: without d/c or deformity               Neck: Neck supple. Gross normal ROM               Cardiovascular: Normal rate and regular rhythm.                 Pulmonary/Chest: Effort normal and breath sounds without rales or wheezing.                Abd:  Soft, NT, ND, + BS, no organomegaly               Neurological: Pt is alert. At baseline orientation, motor grossly intact               Skin: Skin is warm. No rashes, no other new lesions, LE edema - none               Psychiatric: Pt behavior is normal without agitation   Micro: none  Cardiac tracings I have personally interpreted today:  none  Pertinent Radiological  findings (summarize): none   Lab Results  Component Value Date   WBC 6.0 10/21/2023   HGB 15.7 10/21/2023   HCT 50.9 10/21/2023   PLT 332 10/21/2023   GLUCOSE 135 (H) 10/21/2023   CHOL 118 10/12/2023   TRIG 106.0 10/12/2023   HDL 47.80 10/12/2023   LDLCALC 49 10/12/2023   ALT 18 10/18/2023   AST 22 10/18/2023   NA 138 10/21/2023   K 4.0 10/21/2023   CL 99 10/21/2023   CREATININE 0.80 10/21/2023   BUN 13 10/21/2023   CO2 24 10/21/2023   TSH 1.01 10/12/2023   PSA 1.0 09/06/2023   INR 0.9 10/18/2023   HGBA1C 6.9 (A) 07/26/2023   MICROALBUR 2.4 (H) 10/12/2023   Assessment/Plan:  [name] is a 53 y.o. 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  Essential hypertension BP Readings from  Last 3 Encounters:  11/10/23 138/80  10/26/23 (!) 136/90  10/25/23 130/88   Stable, pt to continue medical treatment toprol  xl 100 qd   Type 2 diabetes mellitus with obesity (HCC) Lab Results  Component Value Date   HGBA1C 6.9 (A) 07/26/2023   Stable, pt to continue current medical treatment synjardy  25-1000 every day, rybelsus  7 mg qd   Cough Mild to mod, c/w bornchitis vs pna, delcines cxr, for antibx course zpack  cough med prn,  to f/u any worsening symptoms or concerns  Wheezing Mild to mod, for prednisone  taper, inhaler prn,  to f/u any worsening symptoms or concerns  Followup: Return if symptoms worsen or fail to improve.  Rosalia Colonel, MD 11/10/2023 9:01 PM [facility]

## 2023-11-10 NOTE — Assessment & Plan Note (Signed)
 Mild to mod, c/w bornchitis vs pna, delcines cxr, for antibx course zpack  cough med prn,  to f/u any worsening symptoms or concerns

## 2023-11-10 NOTE — Assessment & Plan Note (Signed)
 Mild to mod, for prednisone taper, inhaler prn,  to f/u any worsening symptoms or concerns

## 2023-11-10 NOTE — Telephone Encounter (Addendum)
 FYI Only or Action Required?: FYI only for provider  Patient was last seen in primary care on 10/25/2023 by Casimer Clear, FNP. Called Nurse Triage reporting Cough. Symptoms began Monday. Interventions attempted: Nothing. Symptoms are: gradually worsening.  Triage Disposition: See PCP When Office is Open (Within 3 Days)   Patient/caregiver understands and will follow disposition?: yes        Copied from CRM [id number]. Topic: Clinical - Red Word Triage >> Nov 10, 2023  9:08 AM [name] wrote: Red Word that prompted transfer to Nurse Triage: Cough that progressing worst Reason for Disposition  [1] Sinus congestion (pressure, fullness) AND [2] present > 10 days  Answer Assessment - Initial Assessment Questions 1. LOCATION: "Where does it hurt?"      Headache - pressure around bilateral eyes 2. ONSET: "When did the sinus pain start?"  (e.g., hours, days)      Monday 3. SEVERITY: "How bad is the pain?"   (Scale 1-10; mild, moderate or severe)   - MILD (1-3): doesn't interfere with normal activities    - MODERATE (4-7): interferes with normal activities (e.g., work or school) or awakens from sleep   - SEVERE (8-10): excruciating pain and patient unable to do any normal activities        moderate 4. RECURRENT SYMPTOM: "Have you ever had sinus problems before?" If Yes, ask: "When was the last time?" and "What happened that time?"      yes 5. NASAL CONGESTION: "Is the nose blocked?" If Yes, ask: "Can you open it or must you breathe through your mouth?"     Mouth breathing at times due to stuffy nose/head 6. NASAL DISCHARGE: "Do you have discharge from your nose?" If so ask, "What color?"     N/a 7. FEVER: "Do you have a fever?" If Yes, ask: "What is it, how was it measured, and when did it start?"      no 8. OTHER SYMPTOMS: "Do you have any other symptoms?" (e.g., sore throat, cough, earache, difficulty breathing)     Cough- nonproductive, SOB at times & pt stated history of  COPD 9. PREGNANCY: "Is there any chance you are pregnant?" "When was your last menstrual period?"     N/a  Protocols used: Sinus Pain or Congestion-A-AH

## 2023-11-10 NOTE — Assessment & Plan Note (Signed)
 Lab Results  Component Value Date   HGBA1C 6.9 (A) 07/26/2023   Stable, pt to continue current medical treatment synjardy  25-1000 every day, rybelsus  7 mg qd

## 2023-12-01 ENCOUNTER — Ambulatory Visit

## 2023-12-14 ENCOUNTER — Ambulatory Visit

## 2023-12-15 NOTE — Progress Notes (Unsigned)
 04/23/2016 Acute OV : Sarcoid   53 yo male followed for Sarcoid w/ pulmonary /neuro/joint involvement , and mild persistent asthma.   TEST  Spirometry 01/14/11 >> FEV1 3.15 (78%), FVC 3.68 (72%), FEV1% 86  Spirometry 12/09/11 >> FEV1 3.25 (80%), FVC 3.85 (76%), FEV1% 84    Last seen in office 3.5 yr ago. Presents today for Sarcoid flare . Complains of joint pain , chest tightness, and increased sob, and dry cough for last couple of months. Worse for last 2 weeks.  Previously on MTX thru Rheumatology. Says he ran out of refills and Rheumatologist would not refill. SABRA Has been off for over 1-2 years ago.  Was admitted 04/13/16 with chest pain , underwent cardiac cath that showed mild non obstructive CAD. CXR showed nad. Nml H/H.  He denies fever, discolored mucus, orthopena, edema or hemotpysis. Occasional rash but nothing right now  OV 01/25/2020  Subjective:  Patient ID: [name], male , DOB: [birth year] , age 53 y.o. , MRN: [id number] , ADDRESS: [address] [place] [address]  PCP [name], MD  01/25/2020 -   Chief Complaint  Patient presents with   Consult    No complaints    HPI [name] 53 y.o. -this is a new consult because he is reestablishing care. He was last seen in our practice more than 3 years ago. Therefore this is a new visit. 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 He tells me that in 2010 he had sarcoidosis diagnosed when he had diffuse arthralgia shortness of breath cough fever, malaise weight loss [B symptoms] and at that point in time also had arthralgia. He also had seizures. Has not had seizures now for 6 years. He was diagnosed with sarcoidosis. He was placed on methotrexate once a week which she is continued since 2010. His last seizure was 6 years ago. At baseline now he only has mild shortness of breath with exertion but there is also some variability with it when he exerts at some points he does not have shortness  of breath but at some point he does have shortness of breath. There is no shortness of breath at rest. He has some baseline arthralgia. He says overall methotrexate is doing well for him. Then in the background of all this couple of times a year he will have a respiratory flare along with joint pain and tiredness. For this he will be treated with a short course of prednisone . He is not on maintenance prednisone . He is just immunosuppressed with methotrexate. Then most recently in July 2021 he had another flareup with joint pain tiredness shortness of breath but no fevers. However he did have night sweats. He was given 10-day prednisone  and is now nearly back to his baseline. His shortness of breath is baseline but he still has a residual cough that is occasional but it is improving. He decided to reestablish with a pulmonologist and therefore is here to see me. He wants to make sure his lungs are okay.  Of note he has significant baseline environmental allergies. He reports being awful with pollen not otherwise specified.   His left ventricular ejection fraction in May 2021 was 55-60%. Diastolic parameters were considered normal.  His blood work in August 2021 showed creatinine normal 0.75 mg percent. Hemoglobin normal 14.3 g%. His blood eosinophils was 100 cells per cubic millimeter.  His hemoglobin A1c was elevated at 7.5   His CT abdomen lung  images in August 2021 was clear   ROS - per HPI     OV 03/21/2020  Subjective:  Patient ID: [name], male , DOB: Oct 02, 1970 , age 53 y.o. , MRN: [id number] , ADDRESS: [address] [place] [address] PCP [name], MD Patient Care Team: [name], MD as PCP - General (Internal Medicine) Maranda Leim DEL, MD as PCP - Cardiology (Cardiology) Missie Mae as Consulting Physician (Rheumatology) Marlo Sober, MD as Referring Physician (Rheumatology) Shellia Oh, MD as Consulting Physician (Pulmonary  Disease) Kassie Mallick, MD as Consulting Physician (Endocrinology) Maranda Leim DEL, MD (Cardiology) Fate Morna SAILOR, [facility] as Pharmacist (Pharmacist)  This Provider for this visit: Treatment Team:  Attending Provider: Geronimo Amel, MD    03/21/2020 -   Chief Complaint  Patient presents with   Follow-up    Hx of Sarcoidosis     HPI [name] 53 y.o. -presents for follow-up to discuss his test results.  He did not do the CBC and blood IgE and allergy  profile.  Nevertheless overall his cough is nearly resolved with time.  He only has very mild shortness of breath on occasion.  He had high-resolution CT chest that did not show any evidence of ILD or mediastinal adenopathy.  No evidence of overt sarcoidosis.  Occasional scattered 3 mm nodules.  There is no evidence of lung cancer pneumonia emphysema either.  With asthma being the other differential diagnosis we checked exhaled nitric oxide  test today and it is 24 and normal.  He had full pulmonary function test that shows variations particularly with a flow volume loop as the major abnormality.  No results found for: NITRICOXIDE   No results found for: NITRICOXIDE   CT chest high resolution 02/07/20  IMPRESSION: 1. No evidence of fibrotic interstitial lung disease. 2. Occasional tiny pulmonary nodules, the largest a 3 mm pulmonary nodule of the right pulmonary apex. No follow-up needed if patient is low-risk (and has no known or suspected primary neoplasm). Non-contrast chest CT can be considered in 12 months if patient is high-risk. This recommendation follows the consensus statement: Guidelines for Management of Incidental Pulmonary Nodules Detected on CT Images: From the Fleischner Society 2017; Radiology 2017; 284:228-243. 3. Coronary artery disease.  Aortic Atherosclerosis (ICD10-I70.0).     Electronically Signed   By: Marolyn Jaksch M.D.   On: 02/07/2020 12:05    ROS - per HPI   Results for [name], [name] (MRN [id number]) as of 03/21/2020 10:06  Ref. Range 08/10/2008 17:12 08/10/2008 20:52  Anti Nuclear Antibody (ANA) Latest Ref Range: NEGATIVE   NEG  Angiotensin 1 Converting Enzyme Latest Ref Range: 9 - 67 units/L  38  RA Latex Turbid. Latest Ref Range: 0.0 - 20.0 intl units/mL < 20.0 IU/mL (L)     Results for [name], [name] (MRN [id number]) as of 03/21/2020 10:06  Ref. Range 11/03/2017 09:32 05/24/2018 11:38 06/14/2018 15:56 08/29/2019 08:47 01/09/2020 08:11  Eosinophils Absolute Latest Ref Range: 0.0 - 0.5 K/uL 0.2 0.2 0.2 0.1 0.1      OV 10/13/2022  Subjective:  Patient ID: [name], male , DOB: [birth year] , age 4 y.o. , MRN: [id number] , ADDRESS: [address] [place] [address] PCP [name], MD Patient Care Team: [name], MD as PCP - General (Internal Medicine) Maranda Leim DEL, MD as PCP - Cardiology (Cardiology) Marlo Sober, MD as Referring Physician (Rheumatology) Shellia Oh, MD as Consulting Physician (Pulmonary Disease) Maranda Leim DEL,  MD (Cardiology) Cleatus Collar, MD as Consulting Physician (Ophthalmology) Trixie File, MD as Consulting Physician (Endocrinology) Aneita Gwendlyn DASEN, MD as Consulting Physician (Gastroenterology)  This Provider for this visit: Treatment Team:  Attending Provider: Geronimo Amel, MD    10/13/2022 -   Chief Complaint  Patient presents with   Follow-up    Sarcoidosis, sob, fatigue, wheezing, and cough.   Has history of sarcoidosis but 2021 CT chest with micronodules.  He had abnormal flow-volume loop pattern.  HPI YUE GLASHEEN 53 y.o. -returns for follow-up.  Not seen in 2-1/2 years.  States he was doing well till early 2024 and started having unintentional weight loss for the last 4 months.  Then for the last few weeks having constellation of B symptoms.  Having shortness of breath.  Having dry cough with wheezing having fatigue having pain in the upper back having knee  arthralgia.  Having heart palpitations.  Also having night sweats but no fever.  No rashes no erythema nodosum no chills.  Symptoms are reminiscent to him of previous diagnosis of sarcoidosis.  This morning he also woke up and had some atypical chest pain in the sternal area associated with cough.  He has a cardiologist Dr. Levern and has not contacted him about the chest pain.  It was not exertional and there is no diaphoresis there is no radiation.  It was associated with cough.  He is worried about the symptoms.  Medical assistant tried to make him do a nitric oxide  exhaled test but he could not perform the maneuver.  In the past he had abnormal flow-volume pattern with his pulmonary function testing.  I referred him to ENT but I doubt he has seen ENT.   11/30/2022 Patient presents today to review testing results. Hx sarcoid, patient of Dr. Geronimo.  Patient was last seen in May.  Previous to this he had not been seen for 2-1/2 years.  He was doing well until early 2024 and started having unintentional weight loss and shortness of breath.  Associated dry cough with wheezing and fatigue.  Unclear nature of symptoms.  Could be sarcoid or something else.  He was ordered for lab work, HRCT, echocardiogram and pulmonary function testing along with  He is doing well today without acute complaints. No change to above respiratory symptoms since consult in May. HRCT chest showed no evidence of sarcoid or interstitial lung disease.  Air-trapping indicative of small airway disease.  Advanced left anterior descending coronary artery calcification.  Echocardiogram completed on 10/29/22 showing normal EF, mild concentric left ventricular hypertrophy.  LV diastolic parameters normal.  Pulmonary function testing today showed severe obstructive lung disease, FEV1 19% predicted after bronchodilator.  Normal diffusion capacity. Lab work was unrevealing.  ACE level 27. Class III allergy  to Alternaria Alternata. Eos absolute  200. Mild elevated sed rate.   Pulmonary function testing 11/30/2022 >> FVC 2.46 (41%), FEV1 0.88 (19%), ratio 36. TLC 63%, DLCO 29.50 (86%)   11/30/2022 FENO >> 27  OV 04/23/2023  Subjective:  Patient ID: [name], male , DOB: 04-19-1971 , age 72 y.o. , MRN: [id number] , ADDRESS: [address] [place] [address] PCP [name], MD Patient Care Team: [name], MD as PCP - General (Internal Medicine) Maranda Leim DEL, MD as PCP - Cardiology (Cardiology) Marlo Sober, MD as Referring Physician (Rheumatology) Shellia Oh, MD (Inactive) as Consulting Physician (Pulmonary Disease) Maranda Leim DEL, MD (Cardiology) Cleatus Collar, MD as Consulting Physician (Ophthalmology) Trixie File, MD  as Consulting Physician (Endocrinology) Aneita Gwendlyn DASEN, MD as Consulting Physician (Gastroenterology)  This Provider for this visit: Treatment Team:  Attending Provider: Geronimo Amel, MD    04/23/2023 -   Chief Complaint  Patient presents with   Follow-up    Breathing is stable today. He has some cough and wheezing occ. He is using airsupra  twice daily.      HPI [name] 53 y.o. -presents for follow-up.  I had not seen him in 2 and half years and then I saw him in May 2024 with night sweats and chills.  I was really worried about B symptoms and sarcoidosis developing.  He then had workup for that and saw nurse practitioner.  It appears he he has advanced obstructive lung disease stage IV.  Almost like a COPD patient but he has not smoked more than 10 pack smoking history.  He did not have any evidence of active sarcoid his blood eosinophils were normal.  No interstitial lung disease.  Nurse practitioner did place him on Trelegy which worked really well for him.SABRA  His alpha-1 antitrypsin was normal.  However his insurance is refusing to approve his Trelegy.  They want him to try Spiriva  and Advair.  Instead he is on Airsupra  which she got it for  a lower cost.  It is not helping him as well.  He takes it as needed every few days.  Up sometimes even twice a day.  He is willing to try Spiriva  and Advair based on insurance recommendations.  There are no other new problems.  Without his Trelegy he feels more symptomatic in terms of shortness of breath and cough.  His current symptom score is below.     OV 12/15/2023  Subjective:  Patient ID: [name], male , DOB: Feb 19, 1971 , age 89 y.o. , MRN: [id number] , ADDRESS: [address] [place] [address] PCP [name], MD Patient Care Team: [name], MD as PCP - General (Internal Medicine) Maranda Leim DEL, MD as PCP - Cardiology (Cardiology) Marlo Sober, MD as Referring Physician (Rheumatology) Shellia Oh, MD (Inactive) as Consulting Physician (Pulmonary Disease) Maranda Leim DEL, MD (Cardiology) Cleatus Collar, MD as Consulting Physician (Ophthalmology) Trixie File, MD as Consulting Physician (Endocrinology) Aneita Gwendlyn DASEN, MD (Inactive) as Consulting Physician (Gastroenterology)  This Provider for this visit: Treatment Team:  Attending Provider: Geronimo Amel, MD    12/15/2023 -  No chief complaint on file.    HPI [name] 53 y.o. -     SYMPTOM SCALE -general symptom scale on Airsupra  04/23/2023  Current weight   O2 use Ra -taking Airsupra  as needed. -> will start Spiriva /Advair  Shortness of Breath 0 -> 5 scale with 5 being worst (score 6 If unable to do)  At rest 0  Simple tasks - showers, clothes change, eating, shaving 2  Household (dishes, doing bed, laundry) 2  Shopping 2  Walking level at own pace 2  Walking up Stairs 5  Total (30-36) Dyspnea Score 13  How bad is your cough? 2  How bad is your fatigue 3  How bad is nausea 0  How bad is vomiting?  0  How bad is diarrhea? 0  How bad is anxiety? 0  How bad is depression 0  Any chronic pain - if so where and how bad 0   CT Chest data from date:  ****  - personally visualized and independently interpreted : *** - my findings are: ***  PFT     Latest Ref Rng & Units 10/21/2023    8:44 AM 11/30/2022    2:58 PM 03/21/2020    9:00 AM  PFT Results  FVC-Pre L 3.61  3.41  3.71   FVC-Predicted Pre % 60  57  73   FVC-Post L  2.46  3.47   FVC-Predicted Post %  41  69   Pre FEV1/FVC % % 84  39  88   Post FEV1/FCV % %  36  85   FEV1-Pre L 3.02  1.33  3.25   FEV1-Predicted Pre % 65  28  80   FEV1-Post L  0.88  2.96   DLCO uncorrected ml/min/mmHg 26.83  30.06  28.93   DLCO UNC% % 78  87  83   DLCO corrected ml/min/mmHg  29.50  28.46   DLCO COR %Predicted %  86  82   DLVA Predicted % 144  146  128   TLC L  5.06  5.29   TLC % Predicted %  63  66   RV % Predicted %  102  83        LAB RESULTS last 96 hours No results found.       has a past medical history of Anginal pain (HCC), Arthritis, Arthritis, Asthma, CORONARY ARTERY DISEASE, DIABETES MELLITUS, TYPE II, Edema, ERECTILE DYSFUNCTION, ORGANIC, GERD, Gout, unspecified, Heart murmur, HYPERLIPIDEMIA, HYPERTENSION, Narcolepsy without cataplexy(347.00), Pericarditis, POLYNEUROPATHY, PULMONARY SARCOIDOSIS, Seizures (HCC), TESTICULAR HYPOFUNCTION, and TIA (transient ischemic attack).   reports that he quit smoking about 14 years ago. His smoking use included cigarettes. He started smoking about 24 years ago. He has a 10 pack-year smoking history. He has been exposed to tobacco smoke. He has never used smokeless tobacco.  Past Surgical History:  Procedure Laterality Date   BRONCHOSCOPY  08/21/08   CARDIAC CATHETERIZATION  06/25/2009   minimal disease   CARDIAC CATHETERIZATION N/A 04/14/2016   Procedure: Left Heart Cath and Coronary Angiography;  Surgeon: Lonni JONETTA Cash, MD;  Location: [facility] INVASIVE CV LAB;  Service: Cardiovascular;  Laterality: N/A;   CHOLECYSTECTOMY N/A 09/21/2017   Procedure: LAPAROSCOPIC CHOLECYSTECTOMY WITH INTRAOPERATIVE CHOLANGIOGRAM;  Surgeon:  Mikell Katz, MD;  Location: [facility] ORS;  Service: General;  Laterality: N/A;   LEFT HEART CATH AND CORONARY ANGIOGRAPHY N/A 10/26/2023   Procedure: LEFT HEART CATH AND CORONARY ANGIOGRAPHY;  Surgeon: Anner Alm ORN, MD;  Location: [facility] INVASIVE CV LAB;  Service: Cardiovascular;  Laterality: N/A;   LEFT HEART CATHETERIZATION WITH CORONARY ANGIOGRAM N/A 05/18/2014   Procedure: LEFT HEART CATHETERIZATION WITH CORONARY ANGIOGRAM;  Surgeon: Candyce GORMAN Reek, MD;  Location: [facility] CATH LAB;  Service: Cardiovascular;  Laterality: N/A;   MEDIASTINOSCOPY  11/30/08    Allergies  Allergen Reactions   Amlodipine  Other (See Comments)    Ankle swelling with 10mg , ok with 5mg  dose   Sulfamethoxazole Rash    fever   Sulfonamide Derivatives Rash and Other (See Comments)    Immunization History  Administered Date(s) Administered   Influenza Split 04/06/2011, 02/17/2012, 03/16/2012   Influenza, Quadrivalent, Recombinant, Inj, Pf 02/20/2022   Influenza,inj,Quad PF,6+ Mos 03/21/2013, 03/08/2014, 04/03/2015, 02/19/2016, 02/17/2017, 06/14/2018, 02/20/2019, 03/21/2020   Influenza-Unspecified 03/08/2021, 02/07/2023   Moderna Sars-Covid-2 Vaccination 07/31/2019, 09/05/2019, 04/19/2020, 02/20/2022   Pfizer(Comirnaty)Fall Seasonal Vaccine 12 years and older 02/20/2022   Pneumococcal Conjugate-13 04/29/2015   Pneumococcal Polysaccharide-23 11/06/2015, 10/12/2023   Tdap 03/21/2013, 10/12/2023    Family History  Problem Relation Age of Onset   Diabetes Mother  Hypertension Mother    Colon polyps Mother    Diabetes Father    Heart disease Father 24       Fatal MI   Heart failure Father    Colon polyps Father    Heart attack Father    Pancreatic cancer Maternal Grandmother    Stomach cancer Maternal Uncle    Pancreatic cancer Maternal Uncle    Colon cancer Neg Hx    Esophageal cancer Neg Hx    Rectal cancer Neg Hx    Liver disease Neg Hx      Current Outpatient Medications:     Albuterol -Budesonide  (AIRSUPRA ) 90-80 MCG/ACT AERO, Inhale 2 puffs into the lungs every 4 (four) hours as needed., Disp: 10.7 g, Rfl: 1   allopurinol  (ZYLOPRIM ) 300 MG tablet, TAKE 1 TABLET(300 MG) BY MOUTH DAILY, Disp: 90 tablet, Rfl: 1   aspirin  EC 81 MG tablet, Take 1 tablet (81 mg total) by mouth daily., Disp: 30 tablet, Rfl: 3   Blood Glucose Monitoring Suppl (ONETOUCH VERIO REFLECT) w/Device KIT, Use as advised, Disp: 1 kit, Rfl: 0   cetirizine (ZYRTEC) 10 MG tablet, Take 10 mg by mouth daily., Disp: , Rfl:    cholecalciferol  (VITAMIN D ) 1000 units tablet, Take 1,000 Units by mouth daily., Disp: , Rfl:    colchicine  0.6 MG tablet, Take 1 tablet (0.6 mg total) by mouth 2 (two) times daily. (Patient taking differently: Take 0.6 mg by mouth daily as needed (gout).), Disp: 180 tablet, Rfl: 0   Continuous Glucose Sensor (DEXCOM G7 SENSOR) MISC, 1 Device by Does not apply route continuous., Disp: 9 each, Rfl: 0   docusate sodium  (COLACE) 100 MG capsule, Take 100 mg by mouth daily., Disp: , Rfl:    Empagliflozin -metFORMIN  HCl ER (SYNJARDY  XR) 25-1000 MG TB24, Take 1 tablet by mouth daily., Disp: 90 tablet, Rfl: 3   fluticasone  (FLONASE ) 50 MCG/ACT nasal spray, Place 2 sprays into both nostrils daily. (Patient taking differently: Place 2 sprays into both nostrils daily as needed for allergies.), Disp: 16 g, Rfl: 6   Fluticasone -Umeclidin-Vilant (TRELEGY ELLIPTA ) 100-62.5-25 MCG/ACT AEPB, Inhale 1 puff into the lungs daily., Disp: , Rfl:    glucose blood test strip, Use as instructed 1x a day, Disp: 100 each, Rfl: 3   ipratropium-albuterol  (DUONEB) 0.5-2.5 (3) MG/3ML SOLN, Take 3 mLs by nebulization every 4 (four) hours as needed., Disp: 360 mL, Rfl: 1   metoprolol  succinate (TOPROL -XL) 100 MG 24 hr tablet, Take 100 mg by mouth daily., Disp: , Rfl:    nitroGLYCERIN  (NITROSTAT ) 0.4 MG SL tablet, Place 1 tablet (0.4 mg total) under the tongue every 5 (five) minutes as needed for chest pain., Disp: 25  tablet, Rfl: 3   ondansetron  (ZOFRAN -ODT) 4 MG disintegrating tablet, Take 1 tablet (4 mg total) by mouth every 8 (eight) hours as needed for nausea or vomiting., Disp: 20 tablet, Rfl: 0   OneTouch Delica Lancets 33G MISC, Use 1x a day, Disp: 100 each, Rfl: 1   oxybutynin  (DITROPAN -XL) 10 MG 24 hr tablet, Take 1 tablet (10 mg total) by mouth at bedtime., Disp: 30 tablet, Rfl: 1   pantoprazole  (PROTONIX ) 40 MG tablet, TAKE 2 TABLETS(80 MG) BY MOUTH DAILY, Disp: 180 tablet, Rfl: 0   predniSONE  (DELTASONE ) 10 MG tablet, 3 tabs by mouth per day for 3 days,2tabs per day for 3 days,1tab per day for 3 days, Disp: 18 tablet, Rfl: 0   rosuvastatin  (CRESTOR ) 40 MG tablet, TAKE 1 TAB BY MOUTH DAILY, Disp: 90  tablet, Rfl: 1   sacubitril-valsartan  (ENTRESTO ) 97-103 MG, Take 1 tablet by mouth 2 (two) times daily., Disp: 180 tablet, Rfl: 3   saw palmetto  500 MG capsule, Take 500 mg by mouth daily., Disp: , Rfl:    Semaglutide  (RYBELSUS ) 7 MG TABS, Take 1 tablet (7 mg total) by mouth daily., Disp: 90 tablet, Rfl: 3   spironolactone (ALDACTONE) 25 MG tablet, Take 25 mg by mouth daily., Disp: , Rfl:    tadalafil  (CIALIS ) 20 MG tablet, TAKE ONE TABLET BY MOUTH DAILY AS NEEDED, Disp: 20 tablet, Rfl: 11   testosterone  cypionate (DEPOTESTOSTERONE CYPIONATE) 200 MG/ML injection, Inject 200 mg into the muscle every 7 (seven) days., Disp: , Rfl:    Zinc Sulfate (ZINC 15 PO), Take 1 tablet by mouth daily., Disp: , Rfl:       Objective:   There were no vitals filed for this visit.  Estimated body mass index is 32.5 kg/m as calculated from the following:   Height as of 11/10/23: 6' 3 (1.905 m).   Weight as of 11/10/23: 260 lb (117.9 kg).  @WEIGHTCHANGE @  There were no vitals filed for this visit.   Physical Exam   General: No distress. *** O2 at rest: *** Cane present: *** Sitting in wheel chair: *** Frail: *** Obese: *** Neuro: Alert and Oriented x 3. GCS 15. Speech normal Psych: Pleasant Resp:  Barrel  Chest - ***.  Wheeze - ***, Crackles - ***, No overt respiratory distress CVS: Normal heart sounds. Murmurs - *** Ext: Stigmata of Connective Tissue Disease - *** HEENT: Normal upper airway. PEERL +. No post nasal drip        Assessment:     No diagnosis found.     Plan:     There are no Patient Instructions on file for this visit.   FOLLOWUP No follow-ups on file.    SIGNATURE    Dr. Dorethia Cave, M.D., F.C.C.P,  Pulmonary and Critical Care Medicine Staff Physician, [facility] Director - Interstitial Lung Disease  Program  Pulmonary Fibrosis [facility] at [facility] [place], [place], [address]  Pager: [phone], If no answer or between  15:00h - 7:00h: call [phone] Telephone: [phone]  7:54 PM 12/15/2023   Moderate Complexity MDM OFFICE  2021 E/M guidelines, first released in 2021, with minor revisions added in 2023 and 2024 Must meet the requirements for 2 out of 3 dimensions to qualify.    Number and complexity of problems addressed Amount and/or complexity of data reviewed Risk of complications and/or morbidity  One or more chronic illness with mild exacerbation, OR progression, OR  side effects of treatment  Two or more stable chronic illnesses  One undiagnosed new problem with uncertain prognosis  One acute illness with systemic symptoms   One Acute complicated injury Must meet the requirements for 1 of 3 of the categories)  Category 1: Tests and documents, historian  Any combination of 3 of the following:  Assessment requiring an independent historian  Review of prior external note(s) from each unique source  Review of results of each unique test  Ordering of each unique test    Category 2: Interpretation of tests   Independent interpretation of a test performed by another physician/other qualified health care professional (not separately reported)  Category 3: Discuss  management/tests  Discussion of management or test interpretation with external physician/other qualified health care professional/appropriate source (not separately reported) Moderate risk  of morbidity from additional diagnostic testing or treatment Examples only:  Prescription drug management  Decision regarding minor surgery with identfied patient or procedure risk factors  Decision regarding elective major surgery without identified patient or procedure risk factors  Diagnosis or treatment significantly limited by social determinants of health             HIGh Complexity  OFFICE   2021 E/M guidelines, first released in 2021, with minor revisions added in 2023. Must meet the requirements for 2 out of 3 dimensions to qualify.    Number and complexity of problems addressed Amount and/or complexity of data reviewed Risk of complications and/or morbidity  Severe exacerbation of chronic illness  Acute or chronic illnesses that may pose a threat to life or bodily function, e.g., multiple trauma, acute MI, pulmonary embolus, severe respiratory distress, progressive rheumatoid arthritis, psychiatric illness with potential threat to self or others, peritonitis, acute renal failure, abrupt change in neurological status Must meet the requirements for 2 of 3 of the categories)  Category 1: Tests and documents, historian  Any combination of 3 of the following:  Assessment requiring an independent historian  Review of prior external note(s) from each unique source  Review of results of each unique test  Ordering of each unique test    Category 2: Interpretation of tests    Independent interpretation of a test performed by another physician/other qualified health care professional (not separately reported)  Category 3: Discuss management/tests  Discussion of management or test interpretation with external physician/other qualified health care professional/appropriate source  (not separately reported)  HIGH risk of morbidity from additional diagnostic testing or treatment Examples only:  Drug therapy requiring intensive monitoring for toxicity  Decision for elective major surgery with identified pateint or procedure risk factors  Decision regarding hospitalization or escalation of level of care  Decision for DNR or to de-escalate care   Parenteral controlled  substances            LEGEND - Independent interpretation involves the interpretation of a test for which there is a CPT code, and an interpretation or report is customary. When a review and interpretation of a test is performed and documented by the provider, but not separately reported (billed), then this would represent an independent interpretation. This report does not need to conform to the usual standards of a complete report of the test. This does not include interpretation of tests that do not have formal reports such as a complete blood count with differential and blood cultures. Examples would include reviewing a chest radiograph and documenting in the medical record an interpretation, but not separately reporting (billing) the interpretation of the chest radiograph.   An appropriate source includes professionals who are not health care professionals but may be involved in the management of the patient, such as a Clinical research associate, upper officer, case manager or teacher, and does not include discussion with family or informal caregivers.    - SDOH: SDOH are the conditions in the environments where people are born, live, learn, work, play, worship, and age that affect a wide range of health, functioning, and quality-of-life outcomes and risks. (e.g., housing, food insecurity, transportation, etc.). SDOH-related Z codes ranging from Z55-Z65 are the ICD-10-CM diagnosis codes used to document SDOH data Z55 - Problems related to education and literacy Z56 - Problems related to employment  and unemployment Z57 - Occupational exposure to risk factors Z58 - Problems related to physical environment Z59 - Problems related to housing and economic  circumstances Z60 - Problems related to social environment [phone] - Problems related to upbringing [phone] - Other problems related to primary support group, including family circumstances Z78 - Problems related to certain psychosocial circumstances Z65 - Problems related to other psychosocial circumstances

## 2023-12-15 NOTE — Patient Instructions (Signed)
 ICD-10-CM   1. Stage 4 very severe COPD by GOLD classification (HCC)  J44.9       -Behaving like advanced COPD even without significant smoking history - Too bad Trelegy is not being approved by insurance -Understandably Airsupra  is not working too well for you  Plan - We will follow insurance recommendations and prescribe Spiriva  and Advair for you  -Meanwhile take samples of Breztri  and take it 2 puffs 2 times daily while waiting for insurance supply of Spiriva  and Advair -Do spirometry and DLCO in 6 months  -Based on the response we could look at other medications that are not being newly approved   Followup -6 months 15-minute visit nurse practitioner or Dr. Geronimo

## 2023-12-16 ENCOUNTER — Ambulatory Visit: Admitting: Internal Medicine

## 2023-12-16 VITALS — BP 126/86 | HR 85 | Temp 97.9°F | Ht 75.0 in | Wt 259.6 lb

## 2023-12-16 DIAGNOSIS — J449 Chronic obstructive pulmonary disease, unspecified: Secondary | ICD-10-CM | POA: Diagnosis not present

## 2023-12-16 MED ORDER — TRELEGY ELLIPTA 100-62.5-25 MCG/ACT IN AEPB: 1.0000 | INHALATION_SPRAY | Freq: Every day | RESPIRATORY_TRACT | Status: DC

## 2023-12-16 NOTE — Addendum Note (Signed)
 Addended byBETHA JESSICA DUWAINE GORMAN on: 12/16/2023 04:33 PM   Modules accepted: Orders

## 2024-01-07 ENCOUNTER — Ambulatory Visit: Payer: Self-pay

## 2024-01-07 NOTE — Telephone Encounter (Signed)
 Copied from CRM #[id number]. Topic: Clinical - Red Word Triage >> Jan 07, 2024  4:47 PM Chiquita SQUIBB wrote: Red Word that prompted transfer to Nurse Triage: Patient is calling in with severe pain in lower back and hip, and frequent urination.    ----------------------------------------------------------------------- From previous Reason for Contact - Scheduling: Patient/patient representative is calling to schedule an appointment. Refer to attachments for appointment information. Reason for Disposition  [1] MODERATE back pain (e.g., interferes with normal activities) AND [2] present > 3 days    Patient states he is familiar with these symptoms and deals with these flare ups.  Answer Assessment - Initial Assessment Questions Patient denies fever, burning with urination, blood in urine. Patient advised he should visit ED/UC tonight or over the weekend if his symptoms worsen, if he develops a fever, has blood in urine, or retains urine and is unable to urinate. Patient verbalized understanding. Patient states he experiences these flare ups because of prostatis.    1. ONSET: When did the pain begin? (e.g., minutes, hours, days)     2 days 2. LOCATION: Where does it hurt? (upper, mid or lower back)     Patient states the pain is where belt line is across the back 3. SEVERITY: How bad is the pain?  (e.g., Scale 1-10; mild, moderate, or severe)     6 5. RADIATION: Does the pain shoot into your legs or somewhere else?     No 6. CAUSE:  What do you think is causing the back pain?      Prostatis  8. MEDICINES: What have you taken so far for the pain? (e.g., nothing, acetaminophen , NSAIDS)     No meds taken yet 9. NEUROLOGIC SYMPTOMS: Do you have any weakness, numbness, or problems with bowel/bladder control?      No 10. OTHER SYMPTOMS: Do you have any other symptoms? (e.g., fever, abdomen pain, burning with urination, blood in urine)       Frequent urination, pelvic/abdominal  pain  Protocols used: Back Pain-A-AH

## 2024-01-10 ENCOUNTER — Ambulatory Visit (INDEPENDENT_AMBULATORY_CARE_PROVIDER_SITE_OTHER): Admitting: Family Medicine

## 2024-01-10 ENCOUNTER — Encounter: Payer: Self-pay | Admitting: Family Medicine

## 2024-01-10 VITALS — BP 134/82 | HR 84 | Temp 98.0°F | Resp 18 | Ht 75.0 in | Wt 262.0 lb

## 2024-01-10 DIAGNOSIS — M1A09X0 Idiopathic chronic gout, multiple sites, without tophus (tophi): Secondary | ICD-10-CM

## 2024-01-10 DIAGNOSIS — N410 Acute prostatitis: Secondary | ICD-10-CM

## 2024-01-10 LAB — POCT URINALYSIS DIPSTICK
Bilirubin, UA: NEGATIVE
Blood, UA: POSITIVE
Glucose, UA: POSITIVE — AB
Ketones, UA: NEGATIVE
Nitrite, UA: NEGATIVE
Protein, UA: NEGATIVE
Spec Grav, UA: 1.015 (ref 1.010–1.025)
Urobilinogen, UA: NEGATIVE U/dL — AB
pH, UA: 5 (ref 5.0–8.0)

## 2024-01-10 MED ORDER — CIPROFLOXACIN HCL 500 MG PO TABS: 500.0000 mg | ORAL_TABLET | Freq: Two times a day (BID) | ORAL | 0 refills | Status: AC

## 2024-01-10 MED ORDER — ALLOPURINOL 300 MG PO TABS: ORAL_TABLET | ORAL | 0 refills | Status: DC

## 2024-01-10 MED ORDER — COLCHICINE 0.6 MG PO TABS: 0.6000 mg | ORAL_TABLET | Freq: Two times a day (BID) | ORAL | 0 refills | Status: AC

## 2024-01-10 NOTE — Progress Notes (Signed)
 Assessment & Plan:  1. Acute prostatitis (Primary) Education provided on prostatitis. No recent urologist follow-up since May. Recommended follow-up with urologist for management of BPH. Advised follow-up if symptoms persist or worsen for possible extension of antibiotic therapy. - POCT urinalysis dipstick - Urine Culture; Future - ciprofloxacin  (CIPRO ) 500 MG tablet; Take 1 tablet (500 mg total) by mouth 2 (two) times daily for 14 days.  Dispense: 28 tablet; Refill: 0    Follow up plan: Return if symptoms worsen or fail to improve.  Niki Rung, MSN, APRN, FNP-C  Subjective:  HPI: [name] is a 53 y.o. male presenting on 01/10/2024 for Urinary Frequency (Possible prostatitis - has had in the past - urine frequency, low back pain, no dysuria - started Thursday )  Discussed the use of AI scribe software for clinical note transcription with the patient, who gave verbal consent to proceed.  History of Present Illness He has been experiencing urinary frequency and low back pain for the past four days. There is no dysuria or hematuria. He feels fatigued and describes a 'flush feeling' that he associates with infections.  He has a history of similar symptoms attributed to prostate issues, which typically resolve with antibiotic treatment but tend to recur. The last episode occurred in May, and he has not consulted his urologist since then.  He wakes up every hour at night to urinate, disrupting his sleep. No fever, but he feels generally run down.  He has used antibiotics in the past to manage these symptoms.     ROS: Negative unless specifically indicated above in HPI.   Relevant past medical history reviewed and updated as indicated.   Allergies and medications reviewed and updated.   Current Outpatient Medications:    Albuterol -Budesonide  (AIRSUPRA ) 90-80 MCG/ACT AERO, Inhale 2 puffs into the lungs every 4 (four) hours as needed., Disp: 10.7 g, Rfl: 1   aspirin  EC 81  MG tablet, Take 1 tablet (81 mg total) by mouth daily., Disp: 30 tablet, Rfl: 3   Blood Glucose Monitoring Suppl (ONETOUCH VERIO REFLECT) w/Device KIT, Use as advised, Disp: 1 kit, Rfl: 0   cetirizine (ZYRTEC) 10 MG tablet, Take 10 mg by mouth daily., Disp: , Rfl:    cholecalciferol  (VITAMIN D ) 1000 units tablet, Take 1,000 Units by mouth daily., Disp: , Rfl:    Continuous Glucose Sensor (DEXCOM G7 SENSOR) MISC, 1 Device by Does not apply route continuous., Disp: 9 each, Rfl: 0   docusate sodium  (COLACE) 100 MG capsule, Take 100 mg by mouth daily., Disp: , Rfl:    Empagliflozin -metFORMIN  HCl ER (SYNJARDY  XR) 25-1000 MG TB24, Take 1 tablet by mouth daily., Disp: 90 tablet, Rfl: 3   fluticasone  (FLONASE ) 50 MCG/ACT nasal spray, Place 2 sprays into both nostrils daily., Disp: 16 g, Rfl: 6   Fluticasone -Umeclidin-Vilant (TRELEGY ELLIPTA ) 100-62.5-25 MCG/ACT AEPB, Inhale 1 puff into the lungs daily., Disp: , Rfl:    glucose blood test strip, Use as instructed 1x a day, Disp: 100 each, Rfl: 3   ipratropium-albuterol  (DUONEB) 0.5-2.5 (3) MG/3ML SOLN, Take 3 mLs by nebulization every 4 (four) hours as needed., Disp: 360 mL, Rfl: 1   metoprolol  succinate (TOPROL -XL) 100 MG 24 hr tablet, Take 100 mg by mouth daily., Disp: , Rfl:    ondansetron  (ZOFRAN -ODT) 4 MG disintegrating tablet, Take 1 tablet (4 mg total) by mouth every 8 (eight) hours as needed for nausea or vomiting., Disp: 20 tablet, Rfl: 0   OneTouch Delica Lancets 33G MISC, Use  1x a day, Disp: 100 each, Rfl: 1   pantoprazole  (PROTONIX ) 40 MG tablet, TAKE 2 TABLETS(80 MG) BY MOUTH DAILY, Disp: 180 tablet, Rfl: 0   rosuvastatin  (CRESTOR ) 40 MG tablet, TAKE 1 TAB BY MOUTH DAILY, Disp: 90 tablet, Rfl: 1   sacubitril-valsartan  (ENTRESTO ) 97-103 MG, Take 1 tablet by mouth 2 (two) times daily., Disp: 180 tablet, Rfl: 3   saw palmetto  500 MG capsule, Take 500 mg by mouth daily., Disp: , Rfl:    Semaglutide  (RYBELSUS ) 7 MG TABS, Take 1 tablet (7 mg total)  by mouth daily., Disp: 90 tablet, Rfl: 3   spironolactone (ALDACTONE) 25 MG tablet, Take 25 mg by mouth daily., Disp: , Rfl:    tadalafil  (CIALIS ) 20 MG tablet, TAKE ONE TABLET BY MOUTH DAILY AS NEEDED, Disp: 20 tablet, Rfl: 11   testosterone  cypionate (DEPOTESTOSTERONE CYPIONATE) 200 MG/ML injection, Inject 200 mg into the muscle every 7 (seven) days., Disp: , Rfl:    Zinc Sulfate (ZINC 15 PO), Take 1 tablet by mouth daily., Disp: , Rfl:    allopurinol  (ZYLOPRIM ) 300 MG tablet, TAKE 1 TABLET(300 MG) BY MOUTH DAILY, Disp: 90 tablet, Rfl: 0   colchicine  0.6 MG tablet, Take 1 tablet (0.6 mg total) by mouth 2 (two) times daily., Disp: 180 tablet, Rfl: 0   nitroGLYCERIN  (NITROSTAT ) 0.4 MG SL tablet, Place 1 tablet (0.4 mg total) under the tongue every 5 (five) minutes as needed for chest pain. (Patient not taking: Reported on 01/10/2024), Disp: 25 tablet, Rfl: 3   oxybutynin  (DITROPAN -XL) 10 MG 24 hr tablet, Take 1 tablet (10 mg total) by mouth at bedtime., Disp: 30 tablet, Rfl: 1  Allergies  Allergen Reactions   Amlodipine  Other (See Comments)    Ankle swelling with 10mg , ok with 5mg  dose   Sulfamethoxazole Rash    fever   Sulfonamide Derivatives Rash and Other (See Comments)    Objective:   BP 134/82   Pulse 84   Temp 98 F (36.7 C)   Resp 18   Ht 6' 3 (1.905 m)   Wt 262 lb (118.8 kg)   BMI 32.75 kg/m    Physical Exam Vitals reviewed.  Constitutional:      General: He is not in acute distress.    Appearance: Normal appearance. He is not ill-appearing, toxic-appearing or diaphoretic.  HENT:     Head: Normocephalic and atraumatic.  Eyes:     General: No scleral icterus.       Right eye: No discharge.        Left eye: No discharge.     Conjunctiva/sclera: Conjunctivae normal.  Cardiovascular:     Rate and Rhythm: Normal rate and regular rhythm.     Heart sounds: Normal heart sounds. No murmur heard.    No friction rub. No gallop.  Pulmonary:     Effort: Pulmonary effort is  normal. No respiratory distress.     Breath sounds: Normal breath sounds. No stridor. No wheezing, rhonchi or rales.  Abdominal:     Tenderness: There is no right CVA tenderness or left CVA tenderness.  Musculoskeletal:        General: Normal range of motion.     Cervical back: Normal range of motion.  Skin:    General: Skin is warm and dry.  Neurological:     Mental Status: He is alert and oriented to person, place, and time. Mental status is at baseline.  Psychiatric:        Mood and Affect: Mood  normal.        Behavior: Behavior normal.        Thought Content: Thought content normal.        Judgment: Judgment normal.

## 2024-01-11 LAB — URINE CULTURE: Result:: NO GROWTH

## 2024-01-25 ENCOUNTER — Encounter: Payer: Self-pay | Admitting: Internal Medicine

## 2024-01-25 ENCOUNTER — Ambulatory Visit (INDEPENDENT_AMBULATORY_CARE_PROVIDER_SITE_OTHER): Payer: No Typology Code available for payment source | Admitting: Internal Medicine

## 2024-01-25 VITALS — BP 110/70 | HR 87 | Ht 75.0 in | Wt 262.0 lb

## 2024-01-25 DIAGNOSIS — Z7984 Long term (current) use of oral hypoglycemic drugs: Secondary | ICD-10-CM | POA: Diagnosis not present

## 2024-01-25 DIAGNOSIS — E1165 Type 2 diabetes mellitus with hyperglycemia: Secondary | ICD-10-CM | POA: Diagnosis not present

## 2024-01-25 DIAGNOSIS — E785 Hyperlipidemia, unspecified: Secondary | ICD-10-CM | POA: Diagnosis not present

## 2024-01-25 DIAGNOSIS — E1159 Type 2 diabetes mellitus with other circulatory complications: Secondary | ICD-10-CM

## 2024-01-25 LAB — POCT GLYCOSYLATED HEMOGLOBIN (HGB A1C): Hemoglobin A1C: 7.2 % — AB (ref 4.0–5.6)

## 2024-01-25 MED ORDER — RYBELSUS 7 MG PO TABS: 7.0000 mg | ORAL_TABLET | Freq: Every day | ORAL | 3 refills | Status: DC

## 2024-01-25 MED ORDER — SYNJARDY XR 25-1000 MG PO TB24: 1.0000 | ORAL_TABLET | Freq: Every day | ORAL | 3 refills | Status: AC

## 2024-01-25 NOTE — Patient Instructions (Addendum)
 Please continue: - Rybelsus  7 mg before b'fast - Synjardy  1000-25 mg before b'fast  Please return in 3-4 months.

## 2024-01-25 NOTE — Progress Notes (Signed)
 Patient ID: [name], male   DOB: 07-Sep-1970, 53 y.o.   MRN: [id number]  HPI: [name] is a 53 y.o.-year-old male, returning for f/u for DM2, dx in 2008, non-insulin -dependent, uncontrolled, with complications (CAD, h/o TIA, PN, ED). He prev. Saw Dr. Kassie.  Last visit with me 6 months ago.  Interim history: No increased urination, blurry vision, nausea. He had CP 0512/2025 >> this was reportedly considered an AMI - on ASA now. Continued Entresto  and Metoprolol . Since last visit, he also had an episode of acute prostatitis 01/10/2024.  This resolved with ABx. He also has sarcoidosis and severe COPD.    Reviewed HbA1c: Lab Results  Component Value Date   HGBA1C 6.9 (A) 07/26/2023   HGBA1C 6.7 (A) 01/19/2023   HGBA1C 6.4 (A) 07/07/2022   HGBA1C 6.7 (A) 03/02/2022   HGBA1C 7.2 (A) 10/21/2021   HGBA1C 7.4 (A) 07/22/2021   HGBA1C 7.6 (A) 04/22/2021   HGBA1C 7.4 (A) 02/18/2021   HGBA1C 7.9 (H) 12/05/2020   HGBA1C 7.4 (A) 09/30/2020  In Dr. Talitha office: 12/2021: HbA1c <7% reportedly  He is on: - Metformin  ER 1000 mg 2x a day >> Synjardy  (Empagliflozin -Metformin ) ER 25-1000 mg in am - Rybelsus  (started 07/2021): 7 mg daily in am He had weight gain with Actos . He had nausea with Repaglinide . Stopped Nateglinide  per cardiology.  He has a h/o gall bladder- related pancreatitis 2019.  Pt checks his sugars >4x a day with his sensor.  He switched from [place] to Dexcom since last visit but he has not logged on the clarity and he cannot give us  permission to review the tracings.  Per his recall: - am: 120s - 2h after b'fast: <220 - lunch: <120 - 2h after lunch: <200 - dinner: ? - 2h after dinner: ? - bedtime: ?  Previously:  Prev.:  Lowest sugar was 54 >> 67 >> 60s (delayed meals) >> 70s; ? hypoglycemia awareness.  Highest sugar was 290 >> ... 250 (Holidays) >> 200.  Glucometer: One Touch Verio  - no CKD, last BUN/creatinine:  Lab Results  Component Value Date    BUN 13 10/21/2023   BUN 12 10/18/2023   CREATININE 0.80 10/21/2023   CREATININE 0.77 10/18/2023   Lab Results  Component Value Date   MICRALBCREAT 25.0 10/12/2023   MICRALBCREAT 24.5 10/07/2009  On Entresto .  - + HL; last set of lipids: Lab Results  Component Value Date   CHOL 118 10/12/2023   HDL 47.80 10/12/2023   LDLCALC 49 10/12/2023   TRIG 106.0 10/12/2023   CHOLHDL 2 10/12/2023  On Crestor  40 mg daily.  - last eye exam was in 10/2023 - No DR.   - no numbness and tingling in his feet. Last foot exam: 10/12/2023.  He has a h/o: HTN, gout, pulm. Sarcoidosis, COPD stage IV - He is not smoking now but did smoke and stopped in 2010, seizures, GERD. Also, recurring pericarditis. At our visit from 06/2022, he had prostatitis with increased urination/nocturia and lower abdominal pressure - Urologist: Dr. Rosalind.  ROS: + see HPI  Past Medical History:  Diagnosis Date   Anginal pain (HCC)    Arthritis    ? 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 Followed by Dr. Ishmael   Arthritis    ankles (04/13/2016)   Asthma    CORONARY ARTERY DISEASE    a. cath 05/2014: mild LAD disease, normal LCx and RCA. Risk factor modification recommended.   DIABETES MELLITUS, TYPE II    Edema  ERECTILE DYSFUNCTION, ORGANIC    GERD    Gout, unspecified    Heart murmur    HYPERLIPIDEMIA    HYPERTENSION    Narcolepsy without cataplexy(347.00)    MSLT 01/09/09 & MRI brain 01/09/09   Pericarditis    recurrent   POLYNEUROPATHY    PULMONARY SARCOIDOSIS    Mediastinal lymphadenopathy with biospy proven sarcodosis   Seizures (HCC)    none in 4-5 years; don't know what kind; not related to alcohol (04/13/2016)   TESTICULAR HYPOFUNCTION    TIA (transient ischemic attack)    I don't remember when (04/13/2016)   Past Surgical History:  Procedure Laterality Date   BRONCHOSCOPY  08/21/08   CARDIAC CATHETERIZATION  06/25/2009   minimal disease   CARDIAC CATHETERIZATION N/A 04/14/2016    Procedure: Left Heart Cath and Coronary Angiography;  Surgeon: Lonni JONETTA Cash, MD;  Location: [facility] INVASIVE CV LAB;  Service: Cardiovascular;  Laterality: N/A;   CHOLECYSTECTOMY N/A 09/21/2017   Procedure: LAPAROSCOPIC CHOLECYSTECTOMY WITH INTRAOPERATIVE CHOLANGIOGRAM;  Surgeon: Mikell Katz, MD;  Location: [facility] ORS;  Service: General;  Laterality: N/A;   LEFT HEART CATH AND CORONARY ANGIOGRAPHY N/A 10/26/2023   Procedure: LEFT HEART CATH AND CORONARY ANGIOGRAPHY;  Surgeon: Anner Alm ORN, MD;  Location: [facility] INVASIVE CV LAB;  Service: Cardiovascular;  Laterality: N/A;   LEFT HEART CATHETERIZATION WITH CORONARY ANGIOGRAM N/A 05/18/2014   Procedure: LEFT HEART CATHETERIZATION WITH CORONARY ANGIOGRAM;  Surgeon: Candyce GORMAN Reek, MD;  Location: [facility] CATH LAB;  Service: Cardiovascular;  Laterality: N/A;   MEDIASTINOSCOPY  11/30/08   Social History   Socioeconomic History   Marital status: Significant Other    Spouse name: Not on file   Number of children: 3   Years of education: Not on file   Highest education level: Not on file  Occupational History   Occupation: Unemployed  Tobacco Use   Smoking status: Former    Current packs/day: 0.00    Average packs/day: 1 pack/day for 10.0 years (10.0 ttl pk-yrs)    Types: Cigarettes    Start date: 05/19/1999    Quit date: 05/18/2009    Years since quitting: 14.6    Passive exposure: Past   Smokeless tobacco: Never  Vaping Use   Vaping status: Never Used  Substance and Sexual Activity   Alcohol use: Yes    Comment: rare   Drug use: No   Sexual activity: Yes  Other Topics Concern   Not on file  Social History Narrative   Married, lives with wife and 3 kids.    Currently student. 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 [employer]   Social Drivers of [employer]: Low Risk  (06/23/2022)   Overall Financial Resource Strain (CARDIA)    Difficulty of Paying Living Expenses: Not hard at all  Food Insecurity: No  Food Insecurity (06/23/2022)   Hunger Vital Sign    Worried About Running Out of Food in the Last Year: Never true    Ran Out of Food in the Last Year: Never true  Transportation Needs: No Transportation Needs (06/23/2022)   PRAPARE - Administrator, Civil Service (Medical): No    Lack of Transportation (Non-Medical): No  Physical Activity: Sufficiently Active (06/23/2022)   Exercise Vital Sign    Days of Exercise per Week: 7 days    Minutes of Exercise per Session: 30 min  Stress: Stress Concern Present (06/23/2022)   [employer] of Occupational Health - Occupational Stress  Questionnaire    Feeling of Stress : To some extent  Social Connections: Socially Integrated (06/23/2022)   Social Connection and Isolation Panel    Frequency of Communication with Friends and Family: More than three times a week    Frequency of Social Gatherings with Friends and Family: More than three times a week    Attends Religious Services: More than 4 times per year    Active Member of [employer] or Organizations: No    Attends Engineer, structural: More than 4 times per year    Marital Status: Living with partner  Intimate Partner Violence: Not At Risk (06/23/2022)   Humiliation, Afraid, Rape, and Kick questionnaire    Fear of Current or Ex-Partner: No    Emotionally Abused: No    Physically Abused: No    Sexually Abused: No   Current Outpatient Medications on File Prior to Visit  Medication Sig Dispense Refill   Albuterol -Budesonide  (AIRSUPRA ) 90-80 MCG/ACT AERO Inhale 2 puffs into the lungs every 4 (four) hours as needed. 10.7 g 1   allopurinol  (ZYLOPRIM ) 300 MG tablet TAKE 1 TABLET(300 MG) BY MOUTH DAILY 90 tablet 0   aspirin  EC 81 MG tablet Take 1 tablet (81 mg total) by mouth daily. 30 tablet 3   Blood Glucose Monitoring Suppl (ONETOUCH VERIO REFLECT) w/Device KIT Use as advised 1 kit 0   cetirizine (ZYRTEC) 10 MG tablet Take 10 mg by mouth daily.     cholecalciferol  (VITAMIN D )  1000 units tablet Take 1,000 Units by mouth daily.     colchicine  0.6 MG tablet Take 1 tablet (0.6 mg total) by mouth 2 (two) times daily. 180 tablet 0   docusate sodium  (COLACE) 100 MG capsule Take 100 mg by mouth daily.     Empagliflozin -metFORMIN  HCl ER (SYNJARDY  XR) 25-1000 MG TB24 Take 1 tablet by mouth daily. 90 tablet 3   fluticasone  (FLONASE ) 50 MCG/ACT nasal spray Place 2 sprays into both nostrils daily. 16 g 6   Fluticasone -Umeclidin-Vilant (TRELEGY ELLIPTA ) 100-62.5-25 MCG/ACT AEPB Inhale 1 puff into the lungs daily.     glucose blood test strip Use as instructed 1x a day 100 each 3   ipratropium-albuterol  (DUONEB) 0.5-2.5 (3) MG/3ML SOLN Take 3 mLs by nebulization every 4 (four) hours as needed. 360 mL 1   metoprolol  succinate (TOPROL -XL) 100 MG 24 hr tablet Take 100 mg by mouth daily.     nitroGLYCERIN  (NITROSTAT ) 0.4 MG SL tablet Place 1 tablet (0.4 mg total) under the tongue every 5 (five) minutes as needed for chest pain. (Patient not taking: Reported on 01/10/2024) 25 tablet 3   ondansetron  (ZOFRAN -ODT) 4 MG disintegrating tablet Take 1 tablet (4 mg total) by mouth every 8 (eight) hours as needed for nausea or vomiting. 20 tablet 0   OneTouch Delica Lancets 33G MISC Use 1x a day 100 each 1   oxybutynin  (DITROPAN -XL) 10 MG 24 hr tablet Take 1 tablet (10 mg total) by mouth at bedtime. 30 tablet 1   pantoprazole  (PROTONIX ) 40 MG tablet TAKE 2 TABLETS(80 MG) BY MOUTH DAILY 180 tablet 0   rosuvastatin  (CRESTOR ) 40 MG tablet TAKE 1 TAB BY MOUTH DAILY 90 tablet 1   sacubitril-valsartan  (ENTRESTO ) 97-103 MG Take 1 tablet by mouth 2 (two) times daily. 180 tablet 3   saw palmetto  500 MG capsule Take 500 mg by mouth daily.     Semaglutide  (RYBELSUS ) 7 MG TABS Take 1 tablet (7 mg total) by mouth daily. 90 tablet 3   spironolactone (  ALDACTONE) 25 MG tablet Take 25 mg by mouth daily.     tadalafil  (CIALIS ) 20 MG tablet TAKE ONE TABLET BY MOUTH DAILY AS NEEDED 20 tablet 11   testosterone   cypionate (DEPOTESTOSTERONE CYPIONATE) 200 MG/ML injection Inject 200 mg into the muscle every 7 (seven) days.     Zinc Sulfate (ZINC 15 PO) Take 1 tablet by mouth daily.     No current facility-administered medications on file prior to visit.   Allergies  Allergen Reactions   Amlodipine  Other (See Comments)    Ankle swelling with 10mg , ok with 5mg  dose   Sulfamethoxazole Rash    fever   Sulfonamide Derivatives Rash and Other (See Comments)   Family History  Problem Relation Age of Onset   Diabetes Mother    Hypertension Mother    Colon polyps Mother    Diabetes Father    Heart disease Father 31       Fatal MI   Heart failure Father    Colon polyps Father    Heart attack Father    Pancreatic cancer Maternal Grandmother    Stomach cancer Maternal Uncle    Pancreatic cancer Maternal Uncle    Colon cancer Neg Hx    Esophageal cancer Neg Hx    Rectal cancer Neg Hx    Liver disease Neg Hx    PE: BP 110/70 (BP Location: Left Arm, Patient Position: Sitting, Cuff Size: Large)   Pulse 87   Ht 6' 3 (1.905 m)   Wt 262 lb (118.8 kg)   SpO2 97%   BMI 32.75 kg/m  Wt Readings from Last 3 Encounters:  01/25/24 262 lb (118.8 kg)  01/10/24 262 lb (118.8 kg)  12/16/23 259 lb 9.6 oz (117.8 kg)   Constitutional: overweight, in NAD Eyes: EOMI, no exophthalmos ENT: no thyromegaly, no cervical lymphadenopathy Cardiovascular: RRR, No MRG Respiratory: CTA B Musculoskeletal: no deformities Skin: no rashes Neurological: no tremor with outstretched hands  ASSESSMENT: 1. DM2, non-insulin -dependent, uncontrolled, with complications - CAD - h/o TIA - PN - ED  2. HL  PLAN:  1. Patient with longstanding, previously uncontrolled type 2 diabetes, but with improved control lately on a combination between metformin , SGLT2 inhibitor and p.o. GLP-1 receptor agonist.  At last visit, HbA1c was still at goal, at 6.9% but slightly higher than before.  He did not have a regular glucometer at home  so I called in a One Touch Verio reflect meter with supplies.  We discussed about situations in which he may need to use it.  However, sugars appears to be at goal, almost entirely fluctuating within the target range but he did have more dietary indiscretions.  We discussed that this may have been the reason for his higher blood sugars.  I did not recommend a change in regimen. -At today's visit, unfortunately, he is on the Dexcom sensor which is started approximately a month ago.  However, he is not able to login the app successfully to give us  permission.  At today's visit we downloaded the clarity app and discussed about establishing an account.  From there, I demonstrated how to give us  permission to see the tracings. - As of now, per his report, sugars are slightly higher than before, up to 200s after meals.  However, I would like to see the actual traces to see if we need to escalate his regimen.  He will try to log in from home and let me know.  Otherwise, we can review the tracings at  next visit. - I suggested to:  Patient Instructions  Please continue: - Rybelsus  7 mg before b'fast - Synjardy  1000-25 mg in am  Please return in 4-6 months.  - we checked his HbA1c: 7.2% (higher) - advised to check sugars at different times of the day - 4x a day, rotating check times - advised for yearly eye exams >> he is UTD - return to clinic in 4-6 months  2.  HL - Latest lipid panel improved: All fractions at goal: Lab Results  Component Value Date   CHOL 118 10/12/2023   HDL 47.80 10/12/2023   LDLCALC 49 10/12/2023   TRIG 106.0 10/12/2023   CHOLHDL 2 10/12/2023  -He continues on Crestor  40 mg daily - no SEs  Lela Fendt, MD PhD [facility] Endocrinology

## 2024-02-04 ENCOUNTER — Other Ambulatory Visit: Payer: Self-pay | Admitting: Family Medicine

## 2024-02-04 DIAGNOSIS — R0981 Nasal congestion: Secondary | ICD-10-CM

## 2024-02-17 ENCOUNTER — Other Ambulatory Visit: Payer: Self-pay | Admitting: Internal Medicine

## 2024-02-17 DIAGNOSIS — I2510 Atherosclerotic heart disease of native coronary artery without angina pectoris: Secondary | ICD-10-CM

## 2024-02-17 DIAGNOSIS — E785 Hyperlipidemia, unspecified: Secondary | ICD-10-CM

## 2024-03-28 ENCOUNTER — Telehealth: Payer: Self-pay

## 2024-03-28 MED ORDER — METOPROLOL SUCCINATE ER 100 MG PO TB24: 100.0000 mg | ORAL_TABLET | Freq: Every day | ORAL | 1 refills | Status: DC

## 2024-03-28 NOTE — Telephone Encounter (Signed)
 RX sent in

## 2024-03-28 NOTE — Telephone Encounter (Signed)
*  STAT* If patient is at the pharmacy, call can be transferred to refill team.   1. Which medications need to be refilled? (please list name of each medication and dose if known) metoprolol  succinate (TOPROL -XL) 100 MG 24 hr tablet   2. Which pharmacy/location (including street and city if local pharmacy) is medication to be sent to?  [id number] - [place], [place] - [address]    3. Do they need a 30 day or 90 day supply? 90

## 2024-04-12 ENCOUNTER — Telehealth: Payer: Self-pay

## 2024-04-12 DIAGNOSIS — I25110 Atherosclerotic heart disease of native coronary artery with unstable angina pectoris: Secondary | ICD-10-CM

## 2024-04-12 DIAGNOSIS — I5030 Unspecified diastolic (congestive) heart failure: Secondary | ICD-10-CM

## 2024-04-12 NOTE — Telephone Encounter (Signed)
 Pt would like to switch providers from [facility], to [facility]. Pt lives in [name] and traveling to [facility] office is no longer practical. Please advise.

## 2024-04-12 NOTE — Telephone Encounter (Signed)
 Called the patient and he reported that his heart rate has been elevated while at rest off and on for the past 10 days. Patient states that while he is resting his heart rate will range from 100 - 116 bpm and this high for him. He also states that he has been experiencing shortness of breath, light headedness and dizziness, no chest pain, chest pressure or discomfort at this time. Patient also states that he has a history of A-fib. He would like to be seen in the office today if possible?

## 2024-04-12 NOTE — Telephone Encounter (Signed)
 Patient c/o Palpitations:  STAT if patient reporting lightheadedness, shortness of breath, or chest pain  How long have you had palpitations/irregular HR/ Afib? Are you having the symptoms now? Yes   Are you currently experiencing lightheadedness, SOB or CP? No   Do you have a history of afib (atrial fibrillation) or irregular heart rhythm? Yes   Have you checked your BP or HR? (document readings if available): HR 105 resting   Are you experiencing any other symptoms? Dizziness   Pt has appt 12/11 but feels that is too far. He has been experiencing heart rate from 100-116. Please advise. Pt is willing to see APP in [place] office. Please advise.

## 2024-04-12 NOTE — Telephone Encounter (Signed)
 Called the patient and informed him of Dr. [facility]'s recommendation below:  Agree with the evaluation in the office, if acutely symptomatic should head to the ER.SABRA Appears like he is requesting a follow-up visit in [place] offices for providers which.  Okay to see any of the cardiologist or APP today if possible.  Patient states that he is not feeling acutely ill. I explained that I would send the message to the front office team and they would call him to schedule his appointment in [place]. Patient verbalized understanding and had no further questions at this time.

## 2024-04-13 ENCOUNTER — Encounter: Payer: Self-pay | Admitting: Internal Medicine

## 2024-04-13 ENCOUNTER — Ambulatory Visit: Attending: Internal Medicine

## 2024-04-13 ENCOUNTER — Ambulatory Visit (INDEPENDENT_AMBULATORY_CARE_PROVIDER_SITE_OTHER): Admitting: Internal Medicine

## 2024-04-13 VITALS — BP 142/88 | HR 91 | Temp 98.4°F | Resp 16 | Ht 75.0 in | Wt 267.4 lb

## 2024-04-13 DIAGNOSIS — E119 Type 2 diabetes mellitus without complications: Secondary | ICD-10-CM

## 2024-04-13 DIAGNOSIS — Z23 Encounter for immunization: Secondary | ICD-10-CM | POA: Diagnosis not present

## 2024-04-13 DIAGNOSIS — I10 Essential (primary) hypertension: Secondary | ICD-10-CM | POA: Diagnosis not present

## 2024-04-13 DIAGNOSIS — R002 Palpitations: Secondary | ICD-10-CM | POA: Diagnosis not present

## 2024-04-13 DIAGNOSIS — E785 Hyperlipidemia, unspecified: Secondary | ICD-10-CM | POA: Diagnosis not present

## 2024-04-13 DIAGNOSIS — E669 Obesity, unspecified: Secondary | ICD-10-CM

## 2024-04-13 DIAGNOSIS — I110 Hypertensive heart disease with heart failure: Secondary | ICD-10-CM

## 2024-04-13 DIAGNOSIS — M1A09X0 Idiopathic chronic gout, multiple sites, without tophus (tophi): Secondary | ICD-10-CM | POA: Diagnosis not present

## 2024-04-13 LAB — TSH: TSH: 0.88 u[IU]/mL (ref 0.35–5.50)

## 2024-04-13 LAB — CBC WITH DIFFERENTIAL/PLATELET
Basophils Absolute: 0.1 K/uL (ref 0.0–0.1)
Basophils Relative: 1.1 % (ref 0.0–3.0)
Eosinophils Absolute: 0.2 K/uL (ref 0.0–0.7)
Eosinophils Relative: 4.8 % (ref 0.0–5.0)
HCT: 47.6 % (ref 39.0–52.0)
Hemoglobin: 15.7 g/dL (ref 13.0–17.0)
Lymphocytes Relative: 40.9 % (ref 12.0–46.0)
Lymphs Abs: 2.1 K/uL (ref 0.7–4.0)
MCHC: 33 g/dL (ref 30.0–36.0)
MCV: 80.9 fl (ref 78.0–100.0)
Monocytes Absolute: 0.4 K/uL (ref 0.1–1.0)
Monocytes Relative: 8.5 % (ref 3.0–12.0)
Neutro Abs: 2.3 K/uL (ref 1.4–7.7)
Neutrophils Relative %: 44.7 % (ref 43.0–77.0)
Platelets: 236 K/uL (ref 150.0–400.0)
RBC: 5.89 Mil/uL — ABNORMAL HIGH (ref 4.22–5.81)
RDW: 14.8 % (ref 11.5–15.5)
WBC: 5.2 K/uL (ref 4.0–10.5)

## 2024-04-13 LAB — BASIC METABOLIC PANEL WITH GFR
BUN: 13 mg/dL (ref 6–23)
CO2: 27 meq/L (ref 19–32)
Calcium: 9.4 mg/dL (ref 8.4–10.5)
Chloride: 101 meq/L (ref 96–112)
Creatinine, Ser: 0.65 mg/dL (ref 0.40–1.50)
GFR: 109.23 mL/min (ref >60.00)
Glucose, Bld: 120 mg/dL — ABNORMAL HIGH (ref 70–99)
Potassium: 4 meq/L (ref 3.5–5.1)
Sodium: 138 meq/L (ref 135–145)

## 2024-04-13 MED ORDER — ALLOPURINOL 300 MG PO TABS: ORAL_TABLET | ORAL | 0 refills | Status: AC

## 2024-04-13 NOTE — Progress Notes (Unsigned)
 EP to read.

## 2024-04-13 NOTE — Progress Notes (Unsigned)
 Subjective:  Patient ID: [name], male    DOB: [birth year]  Age: 53 y.o. MRN: [id number]  CC: Hypertension and Diabetes   HPI LADAINIAN THERIEN presents for f/up -  Discussed the use of AI scribe software for clinical note transcription with the patient, who gave verbal consent to proceed.  History of Present Illness [name] is a 53 year old male who presents with episodes of rapid heart rate and dizziness.  He has been experiencing episodes of rapid heart rate over the past ten days, occurring unexpectedly and primarily at rest, not associated with physical activity. He describes the sensation as his heart rhythm 'shooting up' and then returning to normal. For example, his heart rate was 116 beats per minute upon waking yesterday morning, and a similar episode occurred on Saturday while standing still.  During these episodes, he experiences lightheadedness and dizziness. No chest pain, shortness of breath, syncope, or swelling in the legs or feet. He has a history of palpitations, but notes these current episodes are more intense, describing them as 'beep, beep, beep, beep like it's about to come out your chest.' He reports a slight weight gain recently but no significant changes in appetite.   Outpatient Medications Prior to Visit  Medication Sig Dispense Refill   Albuterol -Budesonide  (AIRSUPRA ) 90-80 MCG/ACT AERO Inhale 2 puffs into the lungs every 4 (four) hours as needed. 10.7 g 1   aspirin  EC 81 MG tablet Take 1 tablet (81 mg total) by mouth daily. 30 tablet 3   cetirizine (ZYRTEC) 10 MG tablet Take 10 mg by mouth daily.     cholecalciferol  (VITAMIN D ) 1000 units tablet Take 1,000 Units by mouth daily.     colchicine  0.6 MG tablet Take 1 tablet (0.6 mg total) by mouth 2 (two) times daily. 180 tablet 0   Continuous Glucose Sensor (DEXCOM G7 SENSOR) MISC USE TO CHECK GLUCOSE CONTINUOUSLY, CHANGE SENSOR EVERY 10 DAYS     docusate sodium  (COLACE) 100 MG capsule Take  100 mg by mouth daily.     Empagliflozin -metFORMIN  HCl ER (SYNJARDY  XR) 25-1000 MG TB24 Take 1 tablet by mouth daily. 90 tablet 3   fluticasone  (FLONASE ) 50 MCG/ACT nasal spray SPRAY 2 SPRAYS INTO EACH NOSTRIL EVERY DAY 48 mL 2   Fluticasone -Umeclidin-Vilant (TRELEGY ELLIPTA ) 100-62.5-25 MCG/ACT AEPB Inhale 1 puff into the lungs daily.     ipratropium-albuterol  (DUONEB) 0.5-2.5 (3) MG/3ML SOLN Take 3 mLs by nebulization every 4 (four) hours as needed. 360 mL 1   metoprolol  succinate (TOPROL -XL) 100 MG 24 hr tablet Take 1 tablet (100 mg total) by mouth daily. 90 tablet 1   nitroGLYCERIN  (NITROSTAT ) 0.4 MG SL tablet Place 1 tablet (0.4 mg total) under the tongue every 5 (five) minutes as needed for chest pain. 25 tablet 3   pantoprazole  (PROTONIX ) 40 MG tablet TAKE 2 TABLETS(80 MG) BY MOUTH DAILY 180 tablet 0   rosuvastatin  (CRESTOR ) 40 MG tablet TAKE 1 TAB BY MOUTH DAILY 90 tablet 1   sacubitril-valsartan  (ENTRESTO ) 97-103 MG Take 1 tablet by mouth 2 (two) times daily. 180 tablet 3   saw palmetto  500 MG capsule Take 500 mg by mouth daily.     Semaglutide  (RYBELSUS ) 7 MG TABS Take 1 tablet (7 mg total) by mouth daily. 90 tablet 3   spironolactone (ALDACTONE) 25 MG tablet Take 25 mg by mouth daily.     tadalafil  (CIALIS ) 20 MG tablet TAKE ONE TABLET BY MOUTH DAILY AS NEEDED (Patient taking differently: Take  20 mg by mouth as needed for erectile dysfunction.) 20 tablet 11   testosterone  cypionate (DEPOTESTOSTERONE CYPIONATE) 200 MG/ML injection Inject 200 mg into the muscle every 7 (seven) days.     Zinc Sulfate (ZINC 15 PO) Take 1 tablet by mouth daily.     allopurinol  (ZYLOPRIM ) 300 MG tablet TAKE 1 TABLET(300 MG) BY MOUTH DAILY 90 tablet 0   ondansetron  (ZOFRAN -ODT) 4 MG disintegrating tablet Take 1 tablet (4 mg total) by mouth every 8 (eight) hours as needed for nausea or vomiting. 20 tablet 0   Blood Glucose Monitoring Suppl (ONETOUCH VERIO REFLECT) w/Device KIT Use as advised 1 kit 0   glucose  blood test strip Use as instructed 1x a day 100 each 3   OneTouch Delica Lancets 33G MISC Use 1x a day 100 each 1   oxybutynin  (DITROPAN -XL) 10 MG 24 hr tablet Take 1 tablet (10 mg total) by mouth at bedtime. 30 tablet 1   No facility-administered medications prior to visit.    ROS Review of Systems  Constitutional:  Positive for unexpected weight change (wt gain). Negative for appetite change, chills, diaphoresis and fatigue.  HENT: Negative.    Eyes: Negative.   Respiratory: Negative.  Negative for cough, chest tightness, shortness of breath and wheezing.   Cardiovascular:  Positive for palpitations. Negative for chest pain and leg swelling.  Gastrointestinal: Negative.  Negative for abdominal pain, constipation, diarrhea, nausea and vomiting.  Endocrine: Negative.   Genitourinary: Negative.   Musculoskeletal: Negative.  Negative for myalgias.  Skin: Negative.   Neurological:  Positive for dizziness and light-headedness. Negative for weakness.  Hematological:  Negative for adenopathy. Does not bruise/bleed easily.  Psychiatric/Behavioral: Negative.      Objective:  BP (!) 142/88 (BP Location: Left Arm, Patient Position: Sitting, Cuff Size: Normal)   Pulse 91   Temp 98.4 F (36.9 C) (Oral)   Resp 16   Ht 6' 3 (1.905 m)   Wt 267 lb 6.4 oz (121.3 kg)   SpO2 97%   BMI 33.42 kg/m   BP Readings from Last 3 Encounters:  04/13/24 (!) 142/88  01/25/24 110/70  01/10/24 134/82    Wt Readings from Last 3 Encounters:  04/13/24 267 lb 6.4 oz (121.3 kg)  01/25/24 262 lb (118.8 kg)  01/10/24 262 lb (118.8 kg)    Physical Exam Vitals reviewed.  Constitutional:      Appearance: Normal appearance.  HENT:     Mouth/Throat:     Mouth: Mucous membranes are moist.  Eyes:     General: No scleral icterus. Cardiovascular:     Rate and Rhythm: Normal rate and regular rhythm.     Heart sounds: Normal heart sounds, S1 normal and S2 normal. No murmur heard.    No friction rub. No  gallop.     Comments: EKG-- NSR, 90 bpm LAE ST elevation c/w early repol. Unchanged  Pulmonary:     Effort: Pulmonary effort is normal.     Breath sounds: No stridor. No wheezing, rhonchi or rales.  Abdominal:     Palpations: There is no mass.     Tenderness: There is no abdominal tenderness. There is no guarding.     Hernia: No hernia is present.  Musculoskeletal:     Cervical back: Neck supple.     Right lower leg: No edema.     Left lower leg: No edema.  Lymphadenopathy:     Cervical: No cervical adenopathy.  Neurological:     General: No focal  deficit present.     Mental Status: He is alert.  Psychiatric:        Mood and Affect: Mood normal.        Behavior: Behavior normal.     Lab Results  Component Value Date   WBC 5.2 04/13/2024   HGB 15.7 04/13/2024   HCT 47.6 04/13/2024   PLT 236.0 04/13/2024   GLUCOSE 120 (H) 04/13/2024   CHOL 118 10/12/2023   TRIG 106.0 10/12/2023   HDL 47.80 10/12/2023   LDLCALC 49 10/12/2023   ALT 18 10/18/2023   AST 22 10/18/2023   NA 138 04/13/2024   K 4.0 04/13/2024   CL 101 04/13/2024   CREATININE 0.65 04/13/2024   BUN 13 04/13/2024   CO2 27 04/13/2024   TSH 0.88 04/13/2024   PSA 1.0 09/06/2023   INR 0.9 10/18/2023   HGBA1C 7.2 (A) 01/25/2024   MICROALBUR 2.4 (H) 10/12/2023    ECHOCARDIOGRAM COMPLETE Result Date: 11/04/2023    ECHOCARDIOGRAM REPORT   Patient Name:   AZIZ SLAPE Date of Exam: 11/04/2023 Medical Rec #:  [id number]         Height:       75.0 in Accession #:    [id number]        Weight:       254.0 lb Date of Birth:  [birth year]          BSA:          2.429 m Patient Age:    52 years          BP:           130/88 mmHg Patient Gender: M                 HR:           92 bpm. Exam Location:  [address] Procedure: 2D Echo, 3D Echo, Cardiac Doppler, Color Doppler and Strain Analysis            (Both Spectral and Color Flow Doppler were utilized during            procedure). Indications:    I50.30 Left Heart  Failure with Preserved LV Function.  History:        Patient has prior history of Echocardiogram examinations, most                 recent 10/29/2022. Risk Factors:Hypertension, Diabetes,                 Dyslipidemia, Family History of Coronary Artery Disease and                 Former Smoker.  Sonographer:    Carl Rodgers-Equilla Que RDCS Referring Phys: [id number] MOHAN HARWANI IMPRESSIONS  1. Left ventricular ejection fraction, by estimation, is 60 to 65%. The left ventricle has normal function. The left ventricle has no regional wall motion abnormalities. Left ventricular diastolic parameters were normal. The average left ventricular global longitudinal strain is -22.2 %. The global longitudinal strain is normal.  2. Right ventricular systolic function is normal. The right ventricular size is normal.  3. Left atrial size was moderately dilated.  4. The mitral valve is normal in structure. No evidence of mitral valve regurgitation. No evidence of mitral stenosis.  5. The aortic valve is tricuspid. There is mild calcification of the aortic valve. There is mild thickening of the aortic valve. Aortic valve regurgitation is not visualized. Aortic valve sclerosis is present, with no evidence of  aortic valve stenosis.  6. The inferior vena cava is normal in size with greater than 50% respiratory variability, suggesting right atrial pressure of 3 mmHg. FINDINGS  Left Ventricle: Left ventricular ejection fraction, by estimation, is 60 to 65%. The left ventricle has normal function. The left ventricle has no regional wall motion abnormalities. The average left ventricular global longitudinal strain is -22.2 %. Strain was performed and the global longitudinal strain is normal. The left ventricular internal cavity size was normal in size. There is no left ventricular hypertrophy. Left ventricular diastolic parameters were normal. Right Ventricle: The right ventricular size is normal. No increase in right ventricular wall thickness.  Right ventricular systolic function is normal. Left Atrium: Left atrial size was moderately dilated. Right Atrium: Right atrial size was normal in size. Pericardium: There is no evidence of pericardial effusion. Mitral Valve: The mitral valve is normal in structure. No evidence of mitral valve regurgitation. No evidence of mitral valve stenosis. Tricuspid Valve: The tricuspid valve is normal in structure. Tricuspid valve regurgitation is not demonstrated. No evidence of tricuspid stenosis. Aortic Valve: The aortic valve is tricuspid. There is mild calcification of the aortic valve. There is mild thickening of the aortic valve. Aortic valve regurgitation is not visualized. Aortic valve sclerosis is present, with no evidence of aortic valve stenosis. Pulmonic Valve: The pulmonic valve was normal in structure. Pulmonic valve regurgitation is not visualized. No evidence of pulmonic stenosis. Aorta: The aortic root is normal in size and structure. Venous: The inferior vena cava is normal in size with greater than 50% respiratory variability, suggesting right atrial pressure of 3 mmHg. IAS/Shunts: No atrial level shunt detected by color flow Doppler. Additional Comments: 3D was performed not requiring image post processing on an independent workstation and was normal.  LEFT VENTRICLE PLAX 2D LVIDd:         4.80 cm   Diastology LVIDs:         2.80 cm   LV e' medial:    9.28 cm/s LV PW:         1.00 cm   LV E/e' medial:  10.0 LV IVS:        1.00 cm   LV e' lateral:   14.58 cm/s LVOT diam:     2.10 cm   LV E/e' lateral: 6.3 LV SV:         71 LV SV Index:   29        2D Longitudinal Strain LVOT Area:     3.46 cm  2D Strain GLS (A4C):   -21.9 %                          2D Strain GLS (A3C):   -23.6 %                          2D Strain GLS (A2C):   -21.1 %                          2D Strain GLS Avg:     -22.2 %                           3D Volume EF:                          3D EF:  63 %                          LV EDV:        223 ml                          LV ESV:       82 ml                          LV SV:        140 ml RIGHT VENTRICLE             IVC RV Basal diam:  4.70 cm     IVC diam: 1.20 cm RV S prime:     13.77 cm/s TAPSE (M-mode): 2.8 cm LEFT ATRIUM             Index        RIGHT ATRIUM           Index LA diam:        4.30 cm 1.77 cm/m   RA Area:     13.40 cm LA Vol (A2C):   61.6 ml 25.36 ml/m  RA Volume:   31.30 ml  12.89 ml/m LA Vol (A4C):   45.0 ml 18.53 ml/m LA Biplane Vol: 54.9 ml 22.60 ml/m  AORTIC VALVE LVOT Vmax:   113.00 cm/s LVOT Vmean:  78.167 cm/s LVOT VTI:    0.204 m  AORTA Ao Root diam: 3.70 cm Ao Asc diam:  3.40 cm MITRAL VALVE MV Area (PHT): 4.06 cm     SHUNTS MV Decel Time: 187 msec     Systemic VTI:  0.20 m MV E velocity: 92.40 cm/s   Systemic Diam: 2.10 cm MV A velocity: 100.93 cm/s MV E/A ratio:  0.92 Maude Emmer MD Electronically signed by Maude Emmer MD Signature Date/Time: 11/04/2023/6:30:01 PM    Final     Assessment & Plan:  Type 2 diabetes mellitus in patient with obesity (HCC) -     Hepatitis B surface antibody,quantitative; Future -     Basic metabolic panel with GFR; Future  Idiopathic chronic gout of multiple sites without tophus -     Allopurinol ; TAKE 1 TABLET(300 MG) BY MOUTH DAILY  Dispense: 90 tablet; Refill: 0  Rapid palpitations- Will evaluate with a heart monitor. -     TSH; Future -     CBC with Differential/Platelet; Future -     EKG 12-Lead -     LONG TERM MONITOR (3-14 DAYS); Future  Hyperlipidemia with target LDL less than 70 -     TSH; Future  Essential hypertension- BP is not at goal. He will improve his lifestyle modifications. -     TSH; Future -     CBC with Differential/Platelet; Future  Immunization due -     Varicella-zoster vaccine IM     Follow-up: Return in about 3 months (around 07/14/2024).  Debby Molt, MD

## 2024-04-13 NOTE — Patient Instructions (Signed)
Palpitations Palpitations are feelings that your heartbeat is irregular or is faster than normal. It may feel like your heart is fluttering or skipping a beat. Palpitations may be caused by many things, including smoking, caffeine, alcohol, stress, and certain medicines or drugs. Most causes of palpitations are not serious.  However, some palpitations can be a sign of a serious problem. Further tests and a thorough medical history will be done to find the cause of your palpitations. Your provider may order tests such as an ECG, labs, an echocardiogram, or an ambulatory continuous ECG monitor. Follow these instructions at home: Pay attention to any changes in your symptoms. Let your health care provider know about them. Take these actions to help manage your symptoms: Eating and drinking Follow instructions from your health care provider about eating or drinking restrictions. You may need to avoid foods and drinks that may cause palpitations. These may include: Caffeinated coffee, tea, soft drinks, and energy drinks. Chocolate. Alcohol. Diet pills. Lifestyle     Take steps to reduce your stress and anxiety. Things that can help you relax include: Yoga. Mind-body activities, such as deep breathing, meditation, or using words and images to create positive thoughts (guided imagery). Physical activity, such as swimming, jogging, or walking. Tell your health care provider if your palpitations increase with activity. If you have chest pain or shortness of breath with activity, do not continue the activity until you are seen by your health care provider. Biofeedback. This is a method that helps you learn to use your mind to control things in your body, such as your heartbeat. Get plenty of rest and sleep. Keep a regular bed time. Do not use drugs, including cocaine or ecstasy. Do not use marijuana. Do not use any products that contain nicotine or tobacco. These products include cigarettes, chewing  tobacco, and vaping devices, such as e-cigarettes. If you need help quitting, ask your health care provider. General instructions Take over-the-counter and prescription medicines only as told by your health care provider. Keep all follow-up visits. This is important. These may include visits for further testing if palpitations do not go away or get worse. Contact a health care provider if: You continue to have a fast or irregular heartbeat for a long period of time. You notice that your palpitations occur more often. Get help right away if: You have chest pain or shortness of breath. You have a severe headache. You feel dizzy or you faint. These symptoms may represent a serious problem that is an emergency. Do not wait to see if the symptoms will go away. Get medical help right away. Call your local emergency services (911 in the [place] Do not drive yourself to the hospital. Summary Palpitations are feelings that your heartbeat is irregular or is faster than normal. It may feel like your heart is fluttering or skipping a beat. Palpitations may be caused by many things, including smoking, caffeine, alcohol, stress, certain medicines, and drugs. Further tests and a thorough medical history may be done to find the cause of your palpitations. Get help right away if you faint or have chest pain, shortness of breath, severe headache, or dizziness. This information is not intended to replace advice given to you by your health care provider. Make sure you discuss any questions you have with your health care provider. Document Revised: 10/16/2020 Document Reviewed: 10/16/2020 Elsevier Patient Education  2024 [employer].

## 2024-04-14 ENCOUNTER — Encounter: Payer: Self-pay | Admitting: Student in an Organized Health Care Education/Training Program

## 2024-04-14 ENCOUNTER — Ambulatory Visit

## 2024-04-14 ENCOUNTER — Ambulatory Visit
Attending: Student in an Organized Health Care Education/Training Program | Admitting: Student in an Organized Health Care Education/Training Program

## 2024-04-14 VITALS — BP 118/86 | HR 102 | Ht 75.0 in | Wt 267.3 lb

## 2024-04-14 DIAGNOSIS — I2510 Atherosclerotic heart disease of native coronary artery without angina pectoris: Secondary | ICD-10-CM

## 2024-04-14 DIAGNOSIS — Z79899 Other long term (current) drug therapy: Secondary | ICD-10-CM

## 2024-04-14 DIAGNOSIS — R002 Palpitations: Secondary | ICD-10-CM | POA: Diagnosis not present

## 2024-04-14 DIAGNOSIS — E785 Hyperlipidemia, unspecified: Secondary | ICD-10-CM | POA: Diagnosis not present

## 2024-04-14 DIAGNOSIS — I10 Essential (primary) hypertension: Secondary | ICD-10-CM | POA: Diagnosis not present

## 2024-04-14 DIAGNOSIS — Z6835 Body mass index (BMI) 35.0-35.9, adult: Secondary | ICD-10-CM

## 2024-04-14 LAB — HEPATITIS B SURFACE ANTIBODY, QUANTITATIVE: Hep B S AB Quant (Post): 164 m[IU]/mL (ref >=10)

## 2024-04-14 MED ORDER — VALSARTAN 80 MG PO TABS: 80.0000 mg | ORAL_TABLET | Freq: Every day | ORAL | 3 refills | Status: DC

## 2024-04-14 NOTE — Assessment & Plan Note (Signed)
-   Has minimal nonobstructive CAD by left heart cath this year. -No ongoing chest pain. Continue aspirin  81 mg daily Continue statin

## 2024-04-14 NOTE — Assessment & Plan Note (Signed)
-  Last lipids were perfect.  No changes. Repeat lipids in 1 year Continue Crestor  40 mg daily

## 2024-04-14 NOTE — Assessment & Plan Note (Signed)
-   Patient is on oral semaglutide  but the best weight loss data is for the injectable. -I will refer the patient to Pharm.D. for consideration of switching to the injectable semaglutide . Refer to Pharm.D.

## 2024-04-14 NOTE — Patient Instructions (Addendum)
 Medication Instructions:  STOP: Entersto STOP: Metoprolol  Succinate  START: Valsartan  80 mg daily  *If you need a refill on your cardiac medications before your next appointment, please call your pharmacy*  Lab Work: NONE If you have labs (blood work) drawn today and your tests are completely normal, you will receive your results only by: MyChart Message (if you have MyChart) OR A paper copy in the mail If you have any lab test that is abnormal or we need to change your treatment, we will call you to review the results.  Testing/Procedures: NONE  Follow-Up: At [facility], you and your health needs are our priority.  As part of our continuing mission to provide you with exceptional heart care, our providers are all part of one team.  This team includes your primary Cardiologist (physician) and Advanced Practice Providers or APPs (Physician Assistants and Nurse Practitioners) who all work together to provide you with the care you need, when you need it.  Your next appointment:   6 month(s)  Provider:   Dr Floretta  We recommend signing up for the patient portal called MyChart.  Sign up information is provided on this After Visit Summary.  MyChart is used to connect with patients for Virtual Visits (Telemedicine).  Patients are able to view lab/test results, encounter notes, upcoming appointments, etc.  Non-urgent messages can be sent to your provider as well.   To learn more about what you can do with MyChart, go to [email or web address].   Other Instructions Keep blood Pressure log. If Blood pressure is uncontrolled after starting the Valsartan  please let our office know.  ZIO XT- Long Term Monitor Instructions  Your physician has requested you wear a ZIO patch monitor for __14__ days.   This is a single patch monitor. [employer] supplies one patch monitor per enrollment. Additional  stickers are not available. Please do not apply patch if you will be having a Nuclear  Stress Test,  Echocardiogram, Cardiac CT, MRI, or Chest Xray during the period you would be wearing the  monitor. The patch cannot be worn during these tests. You cannot remove and re-apply the  ZIO XT patch monitor.   Your ZIO patch monitor will be mailed 3 day USPS to your address on file. It may take 3-5 days  to receive your monitor after you have been enrolled.   Once you have received your monitor, please review the enclosed instructions. Your monitor  has already been registered assigning a specific monitor serial # to you.     Billing and Patient Assistance Program Information  We have supplied [employer] with any of your insurance information on file for billing purposes.  [employer] offers a sliding scale Patient Assistance Program for patients that do not have  insurance, or whose insurance does not completely cover the cost of the ZIO monitor.  You must apply for the Patient Assistance Program to qualify for this discounted rate.   To apply, please call [employer] at [phone], select option 4, select option 2, ask to apply for  Patient Assistance Program. Meredeth will ask your household income, and how many people  are in your household. They will quote your out-of-pocket cost based on that information.  [employer] will also be able to set up a 62-month, interest-free payment plan if needed.     Applying the monitor  Shave hair from upper left chest.  Hold abrader disc by orange tab. Rub abrader in 40 strokes over the upper left chest as  indicated  in your monitor instructions.  Clean area with 4 enclosed alcohol pads. Let dry.  Apply patch as indicated in monitor instructions. Patch will be placed under collarbone on left  side of chest with arrow pointing upward.  Rub patch adhesive wings for 2 minutes. Remove white label marked 1. Remove the white  label marked 2. Rub patch adhesive wings for 2 additional minutes.  While looking in a mirror, press and release button in  center of patch. A small green light will  flash 3-4 times. This will be your only indicator that the monitor has been turned on.  Do not shower for the first 24 hours. You may shower after the first 24 hours.  Press the button if you feel a symptom. You will hear a small click. Record Date, Time and  Symptom in the Patient Logbook.  When you are ready to remove the patch, follow instructions on the last 2 pages of Patient  Logbook. Stick patch monitor onto the last page of Patient Logbook.   Place Patient Logbook in the blue and white box. Use locking tab on box and tape box closed  securely. The blue and white box has prepaid postage on it. Please place it in the mailbox as  soon as possible. Your physician should have your test results approximately 7 days after the  monitor has been mailed back to [facility].   Call [facility] Customer Care at [phone] if you have questions regarding  your ZIO XT patch monitor. Call them immediately if you see an orange light blinking on your  monitor.   If your monitor falls off in less than 4 days, contact our Monitor department at [phone].   If your monitor becomes loose or falls off after 4 days call [employer] at [phone] for  suggestions on securing your monitor.

## 2024-04-14 NOTE — Telephone Encounter (Signed)
 Provider switch approved and pt scheduled today at 1:40 with L. Floretta. Please advise.

## 2024-04-14 NOTE — Progress Notes (Signed)
 Cardiology Office Note:   Date:  04/14/2024  ID:  Eric ONEIDA Ewing, DOB [birth year], MRN [id number] PCP: [name], MD  [facility] Providers Cardiologist:  Leim Moose, MD { Chief Complaint:  Chief Complaint  Patient presents with   Palpitations      History of Present Illness:   MOO GRAVLEY is a 53 y.o. male with a PMH of nonobstructive CAD, HTN, HLD, DM2, RA, severe COPD, pulmonary sarcoid, gout, prior tobacco use, obesity and hypogonadism on testosterone  who presents for follow up.  Patient is a former patient of Dr. Liborio, but establishing care with me due to location preference.  Patient was last seen by cardiology on 10/21/2023 and was complaining of chest pain at that time.  The patient underwent LHC which revealed minimal nonobstructive CAD without identified cause of his chest pain.  Echocardiography on 5/29 was similarly unremarkable.  He saw his PCP yesterday and reported having intermittent episodes of palpitations and associated dizziness.  He has not received his heart monitor yet.  The patient states that his palpitations started roughly 1 week ago and he feels episodes of his heart racing multiple times a day.  These palpitations occur without provocation and occur even at rest.  They are associated with dizziness and shortness of breath.  He has had palpitations before but never this significant.  He cannot identify any triggering factor.  He denies chest pain, PND, significant swelling, syncope.  He takes all of his medications as prescribed without limitation.   Past Medical History:  Diagnosis Date   Anginal pain    Arthritis    ? 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 Followed by Dr. Ishmael   Arthritis    ankles (04/13/2016)   Asthma    CORONARY ARTERY DISEASE    a. cath 05/2014: mild LAD disease, normal LCx and RCA. Risk factor modification recommended.   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)    MSLT 01/09/09 & MRI brain 01/09/09   Pericarditis    recurrent   POLYNEUROPATHY    PULMONARY SARCOIDOSIS    Mediastinal lymphadenopathy with biospy proven sarcodosis   Seizures (HCC)    none in 4-5 years; don't know what kind; not related to alcohol (04/13/2016)   TESTICULAR HYPOFUNCTION    TIA (transient ischemic attack)    I don't remember when (04/13/2016)     Studies Reviewed:    EKG: ECG from yesterday shows NSR       Cardiac Studies & Procedures   ______________________________________________________________________________________________ CARDIAC CATHETERIZATION  CARDIAC CATHETERIZATION 10/26/2023  Conclusion Table formatting from the original result was not included. Images from the original result were not included.    Angiographically minimal disease with no culprit lesion to explain chest pain   The left ventricular systolic function is normal.   LV end diastolic pressure is normal.   The left ventricular ejection fraction is 55-65% by visual estimate.   There is no aortic valve stenosis.  Dominance: Co-dominant  POST-OPERATIVE DIAGNOSIS: Angiographically minimal CAD-similar to 2015 LVEF 60 to 65% with no RWMA.  Normal LVEDP  RECOMMENDATIONS   In the absence of any other complications or medical issues, we expect the patient to be ready for discharge from a cath perspective on 10/26/2023.   No indication for antiplatelet  therapy at this time .  Findings Coronary Findings Diagnostic  Dominance: Co-dominant  Left Main Vessel was injected. Vessel is large. Vessel is angiographically normal.  Left Anterior Descending Vessel was injected. Vessel is normal in caliber. The vessel exhibits minimal luminal irregularities.  First Diagonal Branch Vessel was injected. Vessel is moderate in size. Vessel is angiographically normal.  Lateral  First Diagonal Branch Vessel was injected. Vessel is small in size. Vessel is angiographically normal.  Third Diagonal Branch Vessel was injected. Vessel is small in size. Vessel is angiographically normal.  Left Circumflex Vessel was injected. Vessel is large. Vessel is angiographically normal.  First Obtuse Marginal Branch Vessel was injected. Vessel is small in size. Vessel is angiographically normal.  Second Obtuse Marginal Branch Vessel was injected. Vessel is large in size. Vessel is angiographically normal.  Lateral Second Obtuse Marginal Branch Vessel was injected. Vessel is small in size. Vessel is angiographically normal.  Third Obtuse Marginal Branch Vessel was injected. Vessel is moderate in size. Vessel is angiographically normal.  Lateral Third Obtuse Marginal Branch Vessel was injected. Vessel is moderate in size. Vessel is angiographically normal.  Left Posterior Atrioventricular Artery Vessel was injected. Vessel is small in size. Vessel is angiographically normal.  Right Coronary Artery Vessel was injected. Vessel is normal in caliber. Vessel is angiographically normal.  Acute Marginal Branch Vessel was injected. Vessel is small in size. Vessel is angiographically normal.  Right Ventricular Branch Vessel was injected. Vessel is small in size. Vessel is angiographically normal.  Intervention  No interventions have been documented.   CARDIAC CATHETERIZATION  CARDIAC CATHETERIZATION 04/14/2016  Conclusion  Prox LAD to Dist LAD lesion, 20 %stenosed.  1. Mild non-obstructive CAD 2. Elevation LV filling pressures 3. Non-cardiac chest pain  Recommendations: Medical management of mild CAD.  Findings Coronary Findings Diagnostic  Dominance: Right  Left Anterior Descending  First Diagonal Branch Vessel is moderate in size.  Second Diagonal Branch Vessel is moderate in size.  Third Diagonal Branch Vessel is small in size.  Left  Circumflex Vessel is large. Vessel is angiographically normal.  First Obtuse Marginal Branch Vessel is small in size.  Second Obtuse Marginal Branch Vessel is large in size.  Third Obtuse Marginal Branch Vessel is large in size.  Right Coronary Artery Vessel is large. Vessel is angiographically normal.  Right Posterior Descending Artery Vessel is moderate in size.  Intervention  No interventions have been documented.   STRESS TESTS  MYOCARDIAL PERFUSION IMAGING 09/14/2019  Interpretation Summary  The left ventricular ejection fraction is mildly decreased (45-54%).  Nuclear stress EF: 47%.  There was no ST segment deviation noted during stress.  No T wave inversion was noted during stress.  The study is normal.  This is a low risk study.  Low risk stress nuclear study with normal perfusion and mildly reduced left ventricular global systolic function.   ECHOCARDIOGRAM  ECHOCARDIOGRAM COMPLETE 11/04/2023  Narrative ECHOCARDIOGRAM REPORT    Patient Name:   DJANGO NGUYEN Date of Exam: 11/04/2023 Medical Rec #:  [id number]         Height:       75.0 in Accession #:    [id number]        Weight:       254.0 lb Date of Birth:  Jan 28, 1971          BSA:          2.429 m Patient Age:    52 years  BP:           130/88 mmHg Patient Gender: M                 HR:           92 bpm. Exam Location:  [address]  Procedure: 2D Echo, 3D Echo, Cardiac Doppler, Color Doppler and Strain Analysis (Both Spectral and Color Flow Doppler were utilized during procedure).  Indications:    I50.30 Left Heart Failure with Preserved LV Function.  History:        Patient has prior history of Echocardiogram examinations, most recent 10/29/2022. Risk Factors:Hypertension, Diabetes, Dyslipidemia, Family History of Coronary Artery Disease and Former Smoker.  Sonographer:    Carl Rodgers-Jones RDCS Referring Phys: [id number] MOHAN HARWANI  IMPRESSIONS   1. Left ventricular  ejection fraction, by estimation, is 60 to 65%. The left ventricle has normal function. The left ventricle has no regional wall motion abnormalities. Left ventricular diastolic parameters were normal. The average left ventricular global longitudinal strain is -22.2 %. The global longitudinal strain is normal. 2. Right ventricular systolic function is normal. The right ventricular size is normal. 3. Left atrial size was moderately dilated. 4. The mitral valve is normal in structure. No evidence of mitral valve regurgitation. No evidence of mitral stenosis. 5. The aortic valve is tricuspid. There is mild calcification of the aortic valve. There is mild thickening of the aortic valve. Aortic valve regurgitation is not visualized. Aortic valve sclerosis is present, with no evidence of aortic valve stenosis. 6. The inferior vena cava is normal in size with greater than 50% respiratory variability, suggesting right atrial pressure of 3 mmHg.  FINDINGS Left Ventricle: Left ventricular ejection fraction, by estimation, is 60 to 65%. The left ventricle has normal function. The left ventricle has no regional wall motion abnormalities. The average left ventricular global longitudinal strain is -22.2 %. Strain was performed and the global longitudinal strain is normal. The left ventricular internal cavity size was normal in size. There is no left ventricular hypertrophy. Left ventricular diastolic parameters were normal.  Right Ventricle: The right ventricular size is normal. No increase in right ventricular wall thickness. Right ventricular systolic function is normal.  Left Atrium: Left atrial size was moderately dilated.  Right Atrium: Right atrial size was normal in size.  Pericardium: There is no evidence of pericardial effusion.  Mitral Valve: The mitral valve is normal in structure. No evidence of mitral valve regurgitation. No evidence of mitral valve stenosis.  Tricuspid Valve: The tricuspid valve  is normal in structure. Tricuspid valve regurgitation is not demonstrated. No evidence of tricuspid stenosis.  Aortic Valve: The aortic valve is tricuspid. There is mild calcification of the aortic valve. There is mild thickening of the aortic valve. Aortic valve regurgitation is not visualized. Aortic valve sclerosis is present, with no evidence of aortic valve stenosis.  Pulmonic Valve: The pulmonic valve was normal in structure. Pulmonic valve regurgitation is not visualized. No evidence of pulmonic stenosis.  Aorta: The aortic root is normal in size and structure.  Venous: The inferior vena cava is normal in size with greater than 50% respiratory variability, suggesting right atrial pressure of 3 mmHg.  IAS/Shunts: No atrial level shunt detected by color flow Doppler.  Additional Comments: 3D was performed not requiring image post processing on an independent workstation and was normal.   LEFT VENTRICLE PLAX 2D LVIDd:         4.80 cm   Diastology LVIDs:  2.80 cm   LV e' medial:    9.28 cm/s LV PW:         1.00 cm   LV E/e' medial:  10.0 LV IVS:        1.00 cm   LV e' lateral:   14.58 cm/s LVOT diam:     2.10 cm   LV E/e' lateral: 6.3 LV SV:         71 LV SV Index:   29        2D Longitudinal Strain LVOT Area:     3.46 cm  2D Strain GLS (A4C):   -21.9 % 2D Strain GLS (A3C):   -23.6 % 2D Strain GLS (A2C):   -21.1 % 2D Strain GLS Avg:     -22.2 %  3D Volume EF: 3D EF:        63 % LV EDV:       223 ml LV ESV:       82 ml LV SV:        140 ml  RIGHT VENTRICLE             IVC RV Basal diam:  4.70 cm     IVC diam: 1.20 cm RV S prime:     13.77 cm/s TAPSE (M-mode): 2.8 cm  LEFT ATRIUM             Index        RIGHT ATRIUM           Index LA diam:        4.30 cm 1.77 cm/m   RA Area:     13.40 cm LA Vol (A2C):   61.6 ml 25.36 ml/m  RA Volume:   31.30 ml  12.89 ml/m LA Vol (A4C):   45.0 ml 18.53 ml/m LA Biplane Vol: 54.9 ml 22.60 ml/m AORTIC VALVE LVOT Vmax:    113.00 cm/s LVOT Vmean:  78.167 cm/s LVOT VTI:    0.204 m  AORTA Ao Root diam: 3.70 cm Ao Asc diam:  3.40 cm  MITRAL VALVE MV Area (PHT): 4.06 cm     SHUNTS MV Decel Time: 187 msec     Systemic VTI:  0.20 m MV E velocity: 92.40 cm/s   Systemic Diam: 2.10 cm MV A velocity: 100.93 cm/s MV E/A ratio:  0.92  Maude Emmer MD Electronically signed by Maude Emmer MD Signature Date/Time: 11/04/2023/6:30:01 PM    Final    MONITORS  LONG TERM MONITOR (3-14 DAYS) 11/14/2018  Narrative  Normal sinus rhtyhm. Sinus tachycardia.  Rare PAC.  Average HR 90 bpm.  No pathologic arrhythmias.       ______________________________________________________________________________________________      Risk Assessment/Calculations:              Physical Exam:     VS:  BP 118/86   Pulse (!) 102   Ht 6' 3 (1.905 m)   Wt 267 lb 4.8 oz (121.2 kg)   SpO2 97%   BMI 33.41 kg/m      Wt Readings from Last 3 Encounters:  04/13/24 267 lb 6.4 oz (121.3 kg)  01/25/24 262 lb (118.8 kg)  01/10/24 262 lb (118.8 kg)     GEN: Well nourished, well developed, in no acute distress NECK: No JVD; No carotid bruits CARDIAC: Tachycardic with regular rhythm, no murmurs, rubs, gallops RESPIRATORY:  Clear to auscultation without rales, wheezing or rhonchi  ABDOMEN: Soft, non-tender, non-distended, normal bowel sounds EXTREMITIES:  Warm and well perfused, trace  edema bilaterally; No deformity, 2+ radial pulses PSYCH: Normal mood and affect   Assessment & Plan Palpitations - The patient has 1 week of palpitations and tachycardia at rest. -No evidence of arrhythmia on his most recent ECG or by physical exam. -I agree with pursuing a heart monitor for 2 weeks to assess his cardiac rhythm. -He has not received the 1 from his PCP yet, so I will go ahead and order 1 so that the results come back to me directly. 2-week heart monitor Coronary artery disease involving native coronary artery of  native heart without angina pectoris - Has minimal nonobstructive CAD by left heart cath this year. -No ongoing chest pain. Continue aspirin  81 mg daily Continue statin Primary hypertension - The patient is on spironolactone 25 mg daily, Entresto , and metoprolol  for blood pressure and a previous diagnosis of CHF. -I carefully reviewed all the patient's cardiovascular data and the patient had an EF measured at 47% by nuclear stress test back in 2021. <1 month later he undergoes an echocardiogram which shows completely normal systolic function.  Every echocardiogram subsequent has shown a preserved ejection fraction and even normal diastolic function.  The patient has never been hospitalized for heart failure or had symptoms of heart failure per his report.  I do not believe that the patient has ever had systolic heart failure and thus I am not convinced that he needs full GDMT for management. -I will discontinue Entresto  at this time and switch to valsartan  to specifically treat hypertension rather than CHF. -I will hold off on refilling metoprolol  given that there is not a clear indication for it. -He can continue spironolactone Stop Entresto  for reasons as described above Start valsartan  80 mg daily Continue spironolactone 25 mg daily Stop metoprolol  succinate for now Follow-up in 6 months Hyperlipidemia with target LDL less than 70 -Last lipids were perfect.  No changes. Repeat lipids in 1 year Continue Crestor  40 mg daily Body mass index (BMI) of 35.0 to 35.9 with comorbidity - Patient is on oral semaglutide  but the best weight loss data is for the injectable. -I will refer the patient to Pharm.D. for consideration of switching to the injectable semaglutide . Refer to Pharm.D.          This note was written with the assistance of a dictation microphone or AI dictation software. Please excuse any typos or grammatical errors.   Signed, Georganna Archer, MD 04/14/2024 11:18 AM    [facility]

## 2024-04-14 NOTE — Assessment & Plan Note (Signed)
-   The patient is on spironolactone 25 mg daily, Entresto , and metoprolol  for blood pressure and a previous diagnosis of CHF. -I carefully reviewed all the patient's cardiovascular data and the patient had an EF measured at 47% by nuclear stress test back in 2021. <1 month later he undergoes an echocardiogram which shows completely normal systolic function.  Every echocardiogram subsequent has shown a preserved ejection fraction and even normal diastolic function.  The patient has never been hospitalized for heart failure or had symptoms of heart failure per his report.  I do not believe that the patient has ever had systolic heart failure and thus I am not convinced that he needs full GDMT for management. -I will discontinue Entresto  at this time and switch to valsartan  to specifically treat hypertension rather than CHF. -I will hold off on refilling metoprolol  given that there is not a clear indication for it. -He can continue spironolactone Stop Entresto  for reasons as described above Start valsartan  80 mg daily Continue spironolactone 25 mg daily Stop metoprolol  succinate for now Follow-up in 6 months

## 2024-04-15 ENCOUNTER — Ambulatory Visit: Payer: Self-pay | Admitting: Internal Medicine

## 2024-04-20 ENCOUNTER — Telehealth: Payer: Self-pay | Admitting: Student in an Organized Health Care Education/Training Program

## 2024-04-20 ENCOUNTER — Other Ambulatory Visit: Payer: Self-pay | Admitting: *Deleted

## 2024-04-20 ENCOUNTER — Emergency Department (HOSPITAL_BASED_OUTPATIENT_CLINIC_OR_DEPARTMENT_OTHER)

## 2024-04-20 ENCOUNTER — Ambulatory Visit: Attending: Student in an Organized Health Care Education/Training Program

## 2024-04-20 ENCOUNTER — Other Ambulatory Visit: Payer: Self-pay

## 2024-04-20 ENCOUNTER — Emergency Department (HOSPITAL_BASED_OUTPATIENT_CLINIC_OR_DEPARTMENT_OTHER)
Admission: EM | Admit: 2024-04-20 | Discharge: 2024-04-20 | Disposition: A | Discharge status: Home / Self Care | Attending: Emergency Medicine | Admitting: Emergency Medicine

## 2024-04-20 ENCOUNTER — Encounter (HOSPITAL_BASED_OUTPATIENT_CLINIC_OR_DEPARTMENT_OTHER): Payer: Self-pay | Admitting: Emergency Medicine

## 2024-04-20 DIAGNOSIS — Z7982 Long term (current) use of aspirin: Secondary | ICD-10-CM | POA: Insufficient documentation

## 2024-04-20 DIAGNOSIS — E119 Type 2 diabetes mellitus without complications: Secondary | ICD-10-CM | POA: Insufficient documentation

## 2024-04-20 DIAGNOSIS — I2510 Atherosclerotic heart disease of native coronary artery without angina pectoris: Secondary | ICD-10-CM | POA: Insufficient documentation

## 2024-04-20 DIAGNOSIS — I10 Essential (primary) hypertension: Secondary | ICD-10-CM | POA: Insufficient documentation

## 2024-04-20 DIAGNOSIS — R002 Palpitations: Secondary | ICD-10-CM

## 2024-04-20 DIAGNOSIS — R42 Dizziness and giddiness: Secondary | ICD-10-CM | POA: Diagnosis present

## 2024-04-20 LAB — CBC
HCT: 40.5 % (ref 39.0–52.0)
Hemoglobin: 14 g/dL (ref 13.0–17.0)
MCH: 26.9 pg (ref 26.0–34.0)
MCHC: 34.6 g/dL (ref 30.0–36.0)
MCV: 77.9 fL — ABNORMAL LOW (ref 80.0–100.0)
Platelets: 224 K/uL (ref 150–400)
RBC: 5.2 MIL/uL (ref 4.22–5.81)
RDW: 13.8 % (ref 11.5–15.5)
WBC: 5.8 K/uL (ref 4.0–10.5)
nRBC: 0 % (ref 0.0–0.2)

## 2024-04-20 LAB — BASIC METABOLIC PANEL WITH GFR
Anion gap: 13 (ref 5–15)
BUN: 11 mg/dL (ref 6–20)
CO2: 25 mmol/L (ref 22–32)
Calcium: 9.3 mg/dL (ref 8.9–10.3)
Chloride: 104 mmol/L (ref 98–111)
Creatinine, Ser: 0.69 mg/dL (ref 0.61–1.24)
GFR, Estimated: >60 mL/min
Glucose, Bld: 140 mg/dL — ABNORMAL HIGH (ref 70–99)
Potassium: 3.6 mmol/L (ref 3.5–5.1)
Sodium: 142 mmol/L (ref 135–145)

## 2024-04-20 LAB — D-DIMER, QUANTITATIVE: D-Dimer, Quant: 0.43 ug{FEU}/mL (ref 0.00–0.50)

## 2024-04-20 LAB — TROPONIN T, HIGH SENSITIVITY: Troponin T High Sensitivity: <15 ng/L (ref 0–19)

## 2024-04-20 MED ORDER — SODIUM CHLORIDE 0.9 % IV BOLUS
1000.0000 mL | Freq: Once | INTRAVENOUS | Status: AC
  Administered 2024-04-20: 1000 mL via INTRAVENOUS

## 2024-04-20 MED ORDER — KETOROLAC TROMETHAMINE 15 MG/ML IJ SOLN
15.0000 mg | Freq: Once | INTRAMUSCULAR | Status: AC
  Administered 2024-04-20: 15 mg via INTRAVENOUS
  Filled 2024-04-20: qty 1

## 2024-04-20 MED ORDER — HYDRALAZINE HCL 20 MG/ML IJ SOLN
10.0000 mg | Freq: Once | INTRAMUSCULAR | Status: AC
  Administered 2024-04-20: 10 mg via INTRAVENOUS
  Filled 2024-04-20: qty 1

## 2024-04-20 NOTE — Telephone Encounter (Signed)
 Pt c/o BP issue: STAT if pt c/o blurred vision, one-sided weakness or slurred speech.  STAT if BP is GREATER than 180/120 TODAY.  STAT if BP is LESS than 90/60 and SYMPTOMATIC TODAY  1. What is your BP concern? Pt concerned his bp is still elevated   2. Have you taken any BP medication today? Yes   3. What are your last 5 BP readings?   158/100 - 11/13 150/95 - 11/12 PM 140/85 - 11/12 AM  153/91 - 11/11 PM  150/88 - 11/11 AM   4. Are you having any other symptoms (ex. Dizziness, headache, blurred vision, passed out)?  Headache

## 2024-04-20 NOTE — ED Provider Notes (Signed)
  Physical Exam   Vitals:   04/20/24 1830 04/20/24 1910 04/20/24 1912 04/20/24 1930  BP: (!) 156/97 (!) 152/99  (!) 149/90  Pulse: (!) 103 94  97  Resp: (!) 23 16  18   Temp:   98.1 F (36.7 C)   TempSrc:   Oral   SpO2: 100% 99%  100%     Physical Exam  Procedures  Procedures  ED Course / MDM    Medical Decision Making Amount and/or Complexity of Data Reviewed Labs: ordered. Radiology: ordered.  Risk Prescription drug management.   Patient received at shift change from prior PA-C Christian Prosperi, see their note for initial history, physical exam findings, labs/imaging interpretations, and initial assessment/plan.   Patient presenting with dizziness, left arm tingling, concern for hypertension.  Patient reports that his PCP recently changed his blood pressure medications and he has been having elevated blood pressure readings at home.  Completely normal neurologic examination per prior EDP.  Low suspicion for TIA/stroke given this and reassuring head CT. Patient hypertensive at time of initial assessment, however this has improved after administration of IV hydralazine .  Symptoms are likely secondary to poorly controlled blood pressure, reassuring CT findings as above.  No concern for emergent hypertension at this time.  Patient was experiencing borderline tachycardia and tachypnea, therefore previous EDP did elect to proceed with d-dimer given concern for pulmonary embolism, which is within normal limits at 0.43.  Low suspicion for PE based on this reassuring finding.   He mentions that he has been told by his cardiologist in the past that he tends to run on the tachycardic side, his cardiologist has ordered a heart monitor to assess this further, he has not received this heart monitor yet.  He was recently switched from Entresto  to valsartan . Tachypnea / occasional shortness of breath is likely secondary to known COPD/sarcoidosis.  I discussed these findings in depth with the  patient who voiced understanding, I recommend that he follow-up with his PCP to discuss his blood pressure management further.  Return precautions discussed, he is appropriate for discharge at this time.      Glendia Rocky SAILOR, [place] 04/20/24 2034    Lenor Hollering, MD 04/21/24 [phone]

## 2024-04-20 NOTE — Telephone Encounter (Signed)
 I spoke with patient. He started Valsartan  on 11/8.  Recent BP readings listed below.  He reports readings on 11/8, 11/9 and 11/10 were similar to these readings.  Highest reading was this morning --158/100.  This was before taking his AM medications. He has since rechecked 2 hours after taking medications and reading was 140/94.   Heart rate staying around 100-103 and occasionally going up to 120's.  He has not received monitor yet. Will forward to Dr Floretta for review/recommendations. Will check on monitor shipment  Monitor update--monitor was ordered by Dr Joshua on 11/6.  According to [employer] it was delivered on 04/15/24 in the mailbox.   If the patient did not receive that monitor he needs to contact [employer] at [phone]. The monitor that was ordered on 04/14/24 by Georganna Floretta was cancelled probably because it was a duplicate order.   I spoke with patient and let him know monitor was delivered.  Patient reports he sent that one back in because he was told he should wear the one ordered by Dr Floretta.  Will forward to monitor team for follow up

## 2024-04-20 NOTE — Progress Notes (Unsigned)
 Enrolled for [employer] to mail a ZIO XT long term holter monitor to the patients address on file.   Dr. Debby Molt had ordered monitor on 11/6 which was delivered 11/8. Dr. Floretta ordered a duplicate monitor on 11/7.  The order was cancelled because it was a duplicate. Patient returned Dr. Molt monitor unused, so another monitor order was placed on 04/20/24.

## 2024-04-20 NOTE — ED Provider Notes (Signed)
 [place] [facility] Provider Note   CSN: [id number] Arrival date & time: 04/20/24  1511     Patient presents with: Dizziness   [name] is a 53 y.o. male with past medical history significant for diabetes, hypertension, CAD, sarcoidosis, previous history of epilepsy, left ventricular hypertrophy, COPD secondary to his sarcoidosis, previous TIA.  He denies any nausea, vomiting, chest pain, shortness of breath.  He endorses headache feels like a pressure band across his forehead.  He reports that his blood pressure has been having difficulty at home, his primary care doctor just change his blood pressure medication.  He reports that he has been taking it as directed but that this is the highest it has been recently.    Dizziness      Prior to Admission medications   Medication Sig Start Date End Date Taking? Authorizing Provider  Albuterol -Budesonide  (AIRSUPRA ) 90-80 MCG/ACT AERO Inhale 2 puffs into the lungs every 4 (four) hours as needed. 08/03/23   Alvia Corean CROME, FNP  allopurinol  (ZYLOPRIM ) 300 MG tablet TAKE 1 TABLET(300 MG) BY MOUTH DAILY 04/13/24   [name], MD  aspirin  EC 81 MG tablet Take 1 tablet (81 mg total) by mouth daily. 04/29/15   Inocencio Berwyn LABOR, MD  cetirizine (ZYRTEC) 10 MG tablet Take 10 mg by mouth daily.    [provider]  cholecalciferol  (VITAMIN D ) 1000 units tablet Take 1,000 Units by mouth daily.    [provider]  colchicine  0.6 MG tablet Take 1 tablet (0.6 mg total) by mouth 2 (two) times daily. 01/10/24   Merlynn Niki FALCON, FNP  Continuous Glucose Sensor (DEXCOM G7 SENSOR) MISC USE TO CHECK GLUCOSE CONTINUOUSLY, CHANGE SENSOR EVERY 10 DAYS 03/22/24   [provider]  docusate sodium  (COLACE) 100 MG capsule Take 100 mg by mouth daily.    [provider]  Empagliflozin -metFORMIN  HCl ER (SYNJARDY  XR) 25-1000 MG TB24 Take 1 tablet by mouth daily. 01/25/24   Trixie File, MD  fluticasone  (FLONASE ) 50 MCG/ACT nasal spray SPRAY 2 SPRAYS INTO EACH NOSTRIL EVERY DAY 02/04/24   [name], MD  Fluticasone -Umeclidin-Vilant (TRELEGY ELLIPTA ) 100-62.5-25 MCG/ACT AEPB Inhale 1 puff into the lungs daily. 10/21/23   Hope Almarie ORN, NP  ipratropium-albuterol  (DUONEB) 0.5-2.5 (3) MG/3ML SOLN Take 3 mLs by nebulization every 4 (four) hours as needed. 08/03/23   Alvia Corean CROME, FNP  nitroGLYCERIN  (NITROSTAT ) 0.4 MG SL tablet Place 1 tablet (0.4 mg total) under the tongue every 5 (five) minutes as needed for chest pain. 06/12/21   [name], MD  pantoprazole  (PROTONIX ) 40 MG tablet TAKE 2 TABLETS(80 MG) BY MOUTH DAILY 10/09/21   [name], MD  rosuvastatin  (CRESTOR ) 40 MG tablet TAKE 1 TAB BY MOUTH DAILY 02/18/24   [name], MD  saw palmetto  500 MG capsule Take 500 mg by mouth daily.    [provider]  Semaglutide  (RYBELSUS ) 7 MG TABS Take 1 tablet (7 mg total) by mouth daily. 01/25/24   Trixie File, MD  spironolactone (ALDACTONE) 25 MG tablet Take 25 mg by mouth daily. 09/17/23   [provider]  tadalafil  (CIALIS ) 20 MG tablet TAKE ONE TABLET BY MOUTH DAILY AS NEEDED Patient taking differently: Take 20 mg by mouth as needed for erectile dysfunction. 04/02/22   McKenzie, Belvie CROME, MD  testosterone  cypionate (DEPOTESTOSTERONE CYPIONATE) 200 MG/ML injection Inject 200 mg into the muscle every 7 (seven) days. 05/14/23   [provider]  valsartan  (DIOVAN ) 80 MG tablet Take 1 tablet (80 mg total) by mouth daily. 04/14/24   Floretta Mallard, MD  Zinc Sulfate (ZINC 15 PO) Take 1 tablet by mouth daily.    [provider]    Allergies: Amlodipine , Sulfamethoxazole, and Sulfonamide derivatives    Review of Systems  Neurological:  Positive for dizziness.  All other systems reviewed and are negative.   Updated Vital Signs BP (!) 156/97   Pulse (!) 103   Temp 98.1 F (36.7 C) (Oral)   Resp (!) 23   SpO2  100%   Physical Exam Vitals and nursing note reviewed.  Constitutional:      General: He is not in acute distress.    Appearance: Normal appearance.  HENT:     Head: Normocephalic and atraumatic.  Eyes:     General:        Right eye: No discharge.        Left eye: No discharge.  Cardiovascular:     Rate and Rhythm: Normal rate and regular rhythm.     Heart sounds: No murmur heard.    No friction rub. No gallop.  Pulmonary:     Effort: Pulmonary effort is normal.     Breath sounds: Normal breath sounds.  Abdominal:     General: Bowel sounds are normal.     Palpations: Abdomen is soft.  Skin:    General: Skin is warm and dry.     Capillary Refill: Capillary refill takes less than 2 seconds.  Neurological:     Mental Status: He is alert and oriented to person, place, and time.     Comments: Cranial nerves II through XII grossly intact.  Intact finger-nose, intact heel-to-shin.  Romberg negative, gait normal.  Alert and oriented x3.  Moves all 4 limbs spontaneously, normal coordination.  No pronator drift.  Intact strength 5 out of 5 bilateral upper and lower extremities.  Psychiatric:        Mood and Affect: Mood normal.        Behavior: Behavior normal.     (all labs ordered are listed, but only abnormal results are displayed) Labs Reviewed  BASIC METABOLIC PANEL WITH GFR - Abnormal; Notable for the following components:      Result Value   Glucose, Bld 140 (*)    All other components within normal limits  CBC - Abnormal; Notable for the following components:   MCV 77.9 (*)    All other components within normal limits  D-DIMER, QUANTITATIVE  TROPONIN T, HIGH SENSITIVITY    EKG: EKG Interpretation Date/Time:  Thursday April 20 2024 15:27:22 EST Ventricular Rate:  93 PR Interval:  207 QRS Duration:  86 QT Interval:  364 QTC Calculation: 453 R Axis:   -12  Text Interpretation: Sinus rhythm Prolonged PR interval LAE, consider biatrial enlargement Low voltage,  precordial leads Consider anterolateral infarct ST elevation, consider inferior injury since last tracing no significant change Confirmed by Lenor Hollering [phone]) on 04/20/2024 6:54:52 PM  Radiology: CT Head Wo Contrast Result Date: 04/20/2024 EXAM: CT HEAD WITHOUT CONTRAST 04/20/2024 06:43:25 PM TECHNIQUE: CT of the head was performed without the administration of intravenous contrast. Automated exposure control, iterative reconstruction, and/or weight based adjustment of the mA/kV was utilized to reduce the radiation dose to as low as reasonably achievable. COMPARISON: 2 / 15 / 11 and 12 / 9 / 16 CLINICAL HISTORY: Headache, new onset (Age >= 51y). FINDINGS: BRAIN AND VENTRICLES: No acute hemorrhage. No evidence  of acute infarct. No hydrocephalus. No extra-axial collection. No mass effect or midline shift. Bilateral basal ganglia mineralization. ORBITS: No acute abnormality. SINUSES: No acute abnormality. SOFT TISSUES AND SKULL: No acute soft tissue abnormality. No skull fracture. IMPRESSION: 1. No acute intracranial abnormality. Electronically signed by: Norman Gatlin MD 04/20/2024 06:50 PM EST RP Workstation: [id number]   DG Chest Portable 1 View Result Date: 04/20/2024 EXAM: 1 VIEW(S) XRAY OF THE CHEST 04/20/2024 05:47:00 PM COMPARISON: 10/18/2023 CLINICAL HISTORY: left arm tingling FINDINGS: LUNGS AND PLEURA: Low lung volumes. No focal pulmonary opacity. No pleural effusion. No pneumothorax. HEART AND MEDIASTINUM: No acute abnormality of the cardiac and mediastinal silhouettes. BONES AND SOFT TISSUES: Multilevel thoracic osteophytosis. IMPRESSION: 1. No acute cardiopulmonary abnormality. Electronically signed by: Rogelia Myers MD 04/20/2024 05:52 PM EST RP Workstation: [id number]     Procedures   Medications Ordered in the ED  sodium chloride  0.9 % bolus 1,000 mL (has no administration in time range)  hydrALAZINE  (APRESOLINE ) injection 10 mg (10 mg Intravenous Given 04/20/24 1750)   ketorolac  (TORADOL ) 15 MG/ML injection 15 mg (15 mg Intravenous Given 04/20/24 1750)                                    Medical Decision Making Amount and/or Complexity of Data Reviewed Labs: ordered.   This patient is a 53 y.o. male  who presents to the ED for concern of dizziness, headache.   Differential diagnoses prior to evaluation: The emergent differential diagnosis includes, but is not limited to,  BPPV, vestibular migraine, head trauma, AVM, intracranial tumor, multiple sclerosis, drug-related, CVA, orthostatic hypotension, sepsis, hypoglycemia, electrolyte disturbance, anemia, anxiety, increased ICP, meningitis, intracranial tumor, venous sinus thrombosis, migraine, cluster headache, hypertension, drug related, head injury, tension headache, sinusitis, dental abscess, otitis media, TMJ . This is not an exhaustive differential.   Past Medical History / Co-morbidities / Social History: diabetes, hypertension, CAD, sarcoidosis, previous history of epilepsy, left ventricular hypertrophy, COPD secondary to his sarcoidosis, previous TIA  Physical Exam: Physical exam performed. The pertinent findings include: Cranial nerves II through XII grossly intact.  Intact finger-nose, intact heel-to-shin.  Romberg negative, gait normal.  Alert and oriented x3.  Moves all 4 limbs spontaneously, normal coordination.  No pronator drift.  Intact strength 5 out of 5 bilateral upper and lower extremities.  Hypertensive in the emergency department, blood pressure 167/102  Lab Tests/Imaging studies: I personally interpreted labs/imaging and the pertinent results include: CBC unremarkable, BMP with very mildly elevated glucose at 140, nonfasting lab values, otherwise unremarkable.  Troponin negative x 1 in context of no active chest pain, some nonspecific left arm tingling.  I independently turbid plain film chest x-ray which shows no evidence of acute intrathoracic abnormality.  CT head with no evidence  of acute intracranial abnormality.  I agree with the radiologist interpretation.  Cardiac monitoring: EKG obtained and interpreted by myself and attending physician which shows: Normal sinus rhythm, some ST elevation noted in inferolateral leads, peers fairly stable compared to recent baseline   Medications: I ordered medication including hydralazine  for hypertension, Toradol  for headache.  I have reviewed the patients home medicines and have made adjustments as needed.   Disposition: After consideration of the diagnostic results and the patients response to treatment, I feel that patient with no evidence of ACS, stroke, suspect that his symptoms are related to elevated blood pressure, recommend follow-up with PCP to discuss blood pressure management,  consider discharge with short course of prn hydralazine  for BP spikes.   6:56 PM Care of [name] transferred to [facility] and Dr. Lenor at the end of my shift as the patient will require reassessment once labs/imaging have resulted. Patient presentation, ED course, and plan of care discussed with review of all pertinent labs and imaging. Please see his/her note for further details regarding further ED course and disposition. Plan at time of handoff is reassess BP after pain, BP medication, dimer, fluids. This may be altered or completely changed at the discretion of the oncoming team pending results of further workup.   Final diagnoses:  None    ED Discharge Orders     None          Rosan Sherlean VEAR DEVONNA 04/20/24 1857    Lenor Hollering, MD 04/20/24 [phone]

## 2024-04-20 NOTE — Telephone Encounter (Signed)
 New monitor has been ordered and will be sent to patient. Patient currently in the ED

## 2024-04-20 NOTE — ED Triage Notes (Signed)
 Pt presents with dizziness. Reports his doctor changed his BP meds last week.  160/106 at home with dizziness and tingling in his left arm and leg.

## 2024-04-20 NOTE — Discharge Instructions (Addendum)
 Continue your antihypertensive agents as previously directed.  Follow-up with your primary care provider to discuss your hypertension.  Return to the emergency department if your symptoms worsen.

## 2024-04-21 NOTE — Telephone Encounter (Signed)
 Patient is calling back to speak with the nurse. Please advise

## 2024-04-21 NOTE — Telephone Encounter (Signed)
 Patient requested a call back from a triage nurse. I was able to call him and he stated that his blood pressure is running high and he had to go to the ED yesterday. He was treated there and came home but this morning again, his blood pressure was running in 180/100s. He is only experiencing some dizziness and fatigue. I was able to give Dr. Ruthanne response to him from yesterday about increasing his Valsartan  to 160 mg daily. He stated he will do that and continue monitoring his blood pressure/symptoms. I gave ED precautions and he stated that he will stay in contact with us  to keep us  updated.

## 2024-04-25 NOTE — Telephone Encounter (Signed)
 I called and spoke with the patient. He is still experiencing high blood pressure. He states that currently he is at 177/107 and feels his heart flutter. I advised him to go to the emergency room and get immediate care. He agreed and said he is heading there now. I am sending this to Dr. Floretta to see if he can get an appointment with him for medication management.

## 2024-04-25 NOTE — Telephone Encounter (Signed)
 Left message to call back.

## 2024-04-25 NOTE — Telephone Encounter (Signed)
 Pt c/o BP issue: STAT if pt c/o blurred vision, one-sided weakness or slurred speech.  STAT if BP is GREATER than 180/120 TODAY.  STAT if BP is LESS than 90/60 and SYMPTOMATIC TODAY  1. What is your BP concern? Still elevated after increasing medication  2. Have you taken any BP medication today?yes  3. What are your last 5 BP readings?177/107  4. Are you having any other symptoms (ex. Dizziness, headache, blurred vision, passed out)? no

## 2024-04-25 NOTE — Telephone Encounter (Signed)
 Patient is returning call.

## 2024-04-26 ENCOUNTER — Telehealth: Payer: Self-pay | Admitting: Student in an Organized Health Care Education/Training Program

## 2024-04-26 NOTE — Telephone Encounter (Signed)
 I called the patient to discuss his blood pressure. He states that his Bps are all over the place. In the AM he is seeing 160s/100 and later in the day 130s/88. He is adherent to his medications. We discussed options and I will increase his valsartan  to 320 mg once daily. I will check a BMP in 1 week after the change. He is agreeable to this plan.  Xavier Darley T. Floretta HEATH, MD [place]  [facility]  04/26/2024 5:44 PM

## 2024-04-27 MED ORDER — VALSARTAN 320 MG PO TABS: 320.0000 mg | ORAL_TABLET | Freq: Every day | ORAL | 11 refills | Status: AC

## 2024-04-27 NOTE — Telephone Encounter (Signed)
 Labs ordered.

## 2024-04-27 NOTE — Addendum Note (Signed)
 Addended by: KRASOWSKI, Dareld Mcauliffe on: 04/27/2024 08:36 AM   Modules accepted: Orders

## 2024-04-27 NOTE — Telephone Encounter (Signed)
 I called the patient and asked how he was doing. He said that his blood pressure readings have been better and overall he feels better. We went over Dr. Sullivan's medication changes and the patient wanted me to put in a new order for the increase dose. I put the order in and sent it to the preferred pharmacy.

## 2024-04-27 NOTE — Progress Notes (Signed)
 Patient ID: [name]                 DOB: 1971/04/12                    MRN: [id number]     HPI: [name] is a 53 y.o. male patient referred to pharmacy clinic by Dr. Floretta to initiate GLP1-RA therapy. PMH is significant for HTN, T2DM, non-obstructive CAD, palpitations, and obesity. Most recent BMI 33.41 kg/m . Patient has been taking oral semaglutide , but interested in switch for better weight loss results.   Baseline weight and BMI: 122.2 kg, 6'3, 33.41 kg/m  Current weight and BMI: 33.41 kg/m  Current meds that affect weight: semaglutide  7 mg PO daily  Takes Rybelsus  (semaglutide ) 7 mg daily and Synjardy  (empagliflozin /metformin  ER) 25/1000 mg daily. Denies any side effects from the medications. Last A1c 7.2% on 01/25/2024. Patient had CGM data he shared with us  today. Averaged BG of 160 mg/dL and GMI of 2.8%. Time in range: 76%. Time with high BG (180 - 250): 26%. Time with very high BG (>250): 2% Reports infrequent low BG, with lowest blood glucose of 67 mg/dL, for which he drinks some juice and sees his BG improve.   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 He typically eats breakfast as his largest meal, and often skips lunch. Reports drinking 2 sodas per day and eating out two times per week. Counseled on importance of minimizing this for blood glucose control and weight loss. Exercises several times per week, with cardio on exercise bike and resistance exercise with weights. Instructed patient to aim for goal of 150 minutes of cardio per week and resistance training 2-3 times per week. Today patient weight 269.4 lbs. Patient states his ideal goal would be to get down to 220lbs, which would be a 50 lb loss (~19% of TBW). Patient has not been at this weight in about 30 years, so instructed patient on realistic expectation with medication.   Discussed with patient the differences between Ozempic and Mounjaro for effectiveness for diabetes and  weight management, and demonstrated their administration technique. Does testosterone  injections at home. Patient would prefer to start Mounjaro if possible, but also willing to trial Ozempic if Mounjaro not covered. Will notify patient upon approval of medication, and instructed him on how to get copay card from manufacturer if necessary. Patient willing to start medication immediately with holidays approaching.   Diet: Eats breakfast every morning with medications, oatmeal, banana, cereal with occasional egg, bacon, pancake, etc. Skips lunch most days. Lighter dinner. Meatloaf, mashed potatoes, etc. Eats out about 3 at sit-down places about three times per week. Does not snack much. Likes soda, has about 2 per day.   Exercise: Started going to gym past couple of weeks. Bike, walking, does some resistance exercise with weights. About 3 times per week.   Family History:  Family History  Problem Relation Age of Onset   Diabetes Mother    Hypertension Mother    Colon polyps Mother    Diabetes Father    Heart disease Father 84       Fatal MI   Heart failure Father    Colon polyps Father    Heart attack Father    Pancreatic cancer Maternal Grandmother    Stomach cancer Maternal Uncle    Pancreatic cancer Maternal Uncle    Colon cancer Neg Hx    Esophageal cancer Neg Hx  Rectal cancer Neg Hx    Liver disease Neg Hx      Social History:   Labs: Lab Results  Component Value Date   HGBA1C 7.2 (A) 01/25/2024    Wt Readings from Last 1 Encounters:  04/28/24 269 lb 6.4 oz (122.2 kg)    BP Readings from Last 1 Encounters:  04/20/24 (!) 152/91   Pulse Readings from Last 1 Encounters:  04/20/24 94       Component Value Date/Time   CHOL 118 10/12/2023 1425   CHOL 83 (L) 09/20/2019 0746   TRIG 106.0 10/12/2023 1425   TRIG 90 06/25/2009 0000   HDL 47.80 10/12/2023 1425   HDL 32 (L) 09/20/2019 0746   CHOLHDL 2 10/12/2023 1425   VLDL 21.2 10/12/2023 1425   LDLCALC 49  10/12/2023 1425   LDLCALC 37 09/20/2019 0746    Past Medical History:  Diagnosis Date   Anginal pain    Arthritis    ? 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 Followed by Dr. Ishmael   Arthritis    ankles (04/13/2016)   Asthma    CORONARY ARTERY DISEASE    a. cath 05/2014: mild LAD disease, normal LCx and RCA. Risk factor modification recommended.   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)    MSLT 01/09/09 & MRI brain 01/09/09   Pericarditis    recurrent   POLYNEUROPATHY    PULMONARY SARCOIDOSIS    Mediastinal lymphadenopathy with biospy proven sarcodosis   Seizures (HCC)    none in 4-5 years; don't know what kind; not related to alcohol (04/13/2016)   TESTICULAR HYPOFUNCTION    TIA (transient ischemic attack)    I don't remember when (04/13/2016)    Current Outpatient Medications on File Prior to Visit  Medication Sig Dispense Refill   Albuterol -Budesonide  (AIRSUPRA ) 90-80 MCG/ACT AERO Inhale 2 puffs into the lungs every 4 (four) hours as needed. 10.7 g 1   allopurinol  (ZYLOPRIM ) 300 MG tablet TAKE 1 TABLET(300 MG) BY MOUTH DAILY 90 tablet 0   aspirin  EC 81 MG tablet Take 1 tablet (81 mg total) by mouth daily. 30 tablet 3   cetirizine (ZYRTEC) 10 MG tablet Take 10 mg by mouth daily.     cholecalciferol  (VITAMIN D ) 1000 units tablet Take 1,000 Units by mouth daily.     colchicine  0.6 MG tablet Take 1 tablet (0.6 mg total) by mouth 2 (two) times daily. 180 tablet 0   Continuous Glucose Sensor (DEXCOM G7 SENSOR) MISC USE TO CHECK GLUCOSE CONTINUOUSLY, CHANGE SENSOR EVERY 10 DAYS     docusate sodium  (COLACE) 100 MG capsule Take 100 mg by mouth daily.     Empagliflozin -metFORMIN  HCl ER (SYNJARDY  XR) 25-1000 MG TB24 Take 1 tablet by mouth daily. 90 tablet 3   fluticasone  (FLONASE ) 50 MCG/ACT nasal spray SPRAY 2 SPRAYS INTO  EACH NOSTRIL EVERY DAY 48 mL 2   Fluticasone -Umeclidin-Vilant (TRELEGY ELLIPTA ) 100-62.5-25 MCG/ACT AEPB Inhale 1 puff into the lungs daily.     ipratropium-albuterol  (DUONEB) 0.5-2.5 (3) MG/3ML SOLN Take 3 mLs by nebulization every 4 (four) hours as needed. 360 mL 1   nitroGLYCERIN  (NITROSTAT ) 0.4 MG SL tablet Place 1 tablet (0.4 mg total) under the tongue every 5 (five) minutes as needed for chest pain. 25 tablet 3   pantoprazole  (PROTONIX )  40 MG tablet TAKE 2 TABLETS(80 MG) BY MOUTH DAILY 180 tablet 0   rosuvastatin  (CRESTOR ) 40 MG tablet TAKE 1 TAB BY MOUTH DAILY 90 tablet 1   saw palmetto  500 MG capsule Take 500 mg by mouth daily.     Semaglutide  (RYBELSUS ) 7 MG TABS Take 1 tablet (7 mg total) by mouth daily. 90 tablet 3   spironolactone (ALDACTONE) 25 MG tablet Take 25 mg by mouth daily.     tadalafil  (CIALIS ) 20 MG tablet TAKE ONE TABLET BY MOUTH DAILY AS NEEDED (Patient taking differently: Take 20 mg by mouth as needed for erectile dysfunction.) 20 tablet 11   testosterone  cypionate (DEPOTESTOSTERONE CYPIONATE) 200 MG/ML injection Inject 200 mg into the muscle every 7 (seven) days.     valsartan  (DIOVAN ) 320 MG tablet Take 1 tablet (320 mg total) by mouth daily. 30 tablet 11   Zinc Sulfate (ZINC 15 PO) Take 1 tablet by mouth daily.     No current facility-administered medications on file prior to visit.    Allergies  Allergen Reactions   Amlodipine  Other (See Comments)    Ankle swelling with 10mg , ok with 5mg  dose   Sulfamethoxazole Rash    fever   Sulfonamide Derivatives Rash and Other (See Comments)     Assessment/Plan:  1. Weight loss -   Problem  Type 2 Diabetes Mellitus in Patient With Obesity (Hcc)   Baseline weight and BMI: 121.2 kg, 6'3, 33.41 kg/m  Current weight and BMI: 33.41 kg/m  Current meds that affect weight: semaglutide  7 mg PO daily  Takes Rybelsus  (semaglutide ) 7 mg daily and Synjardy  (empagliflozin /metformin  ER) 25/1000 mg daily. Denies any side  effects from the medications. Last A1c 7.2% on 01/25/2024. Patient had CGM data he shared with us  today. Averaged BG of 160 mg/dL and GMI of 2.8%. Time in range: 76%. Time with high BG (180 - 250): 26%. Time with very high BG (>250): 2% Reports infrequent low BG, with lowest blood glucose of 67 mg/dL, for which he drinks some juice and sees his BG improve.     Type 2 diabetes mellitus in patient with obesity (HCC) Assessment:  - Patients diabetes is uncontrolled with GMI of 7.1% and elevated readings 26% of the time, though he appears close to goal.  - Has not noticed weight loss with use of Rybelsus . Would benefit from subcutaneous GLP-1 for concurrent weight loss and BG control.  - Patient weight of 269.4 lbs (122.2 kg) with BMI of 33.67 kg/m2. Would benefit from weight loss.   Plan:  - Stop Rybelsus  (semaglutide ) 7 mg daily - Start Mounjaro 2.5 mg subcutaneous weekly or Ozempic 0.25 mg subcutaneous weekly based on insurance coverage - Instructed patient on beneficial lifestyle changes to assist with weight loss, like cutting out sugary beverages, doing cardio exercise for 150 minutes/week, and limiting how frequently he eats out.   Thank you,   Noah Jordan, Pharm.D Candidate   Robbi Blanch, [address]D [place] Elspeth BIRCH. [facility] [address], [place], [place] [address] Phone: [phone]; Fax: [phone]

## 2024-04-28 ENCOUNTER — Other Ambulatory Visit (HOSPITAL_COMMUNITY): Payer: Self-pay

## 2024-04-28 ENCOUNTER — Ambulatory Visit: Attending: Internal Medicine | Admitting: Pharmacist

## 2024-04-28 ENCOUNTER — Telehealth: Payer: Self-pay | Admitting: Pharmacist

## 2024-04-28 ENCOUNTER — Encounter: Payer: Self-pay | Admitting: Pharmacist

## 2024-04-28 VITALS — Ht 75.0 in | Wt 269.4 lb

## 2024-04-28 DIAGNOSIS — E119 Type 2 diabetes mellitus without complications: Secondary | ICD-10-CM

## 2024-04-28 DIAGNOSIS — E669 Obesity, unspecified: Secondary | ICD-10-CM

## 2024-04-28 NOTE — Patient Instructions (Addendum)
 It was nice to see you today!    Medication Changes:  - Stop Rybelsus  (semaglutide ) 7 mg daily - We will attempt to get Mounajro or Ozempic covered through your insurance. Once we do, we will notify you via MyChart.  - Continue making dietary changes discussed earlier, like limiting sugary drinks and focusing on protein intake.  - Continue to exercise frequently, with goal of 150 minutes of cardio for week and resistance exercise 2 - 3 times per week.

## 2024-04-28 NOTE — Assessment & Plan Note (Signed)
 Assessment:  - Patients diabetes is uncontrolled with GMI of 7.1% and elevated readings 26% of the time, though he appears close to goal.  - Has not noticed weight loss with use of Rybelsus . Would benefit from subcutaneous GLP-1 for concurrent weight loss and BG control.  - Patient weight of 269.4 lbs (122.2 kg) with BMI of 33.67 kg/m2. Would benefit from weight loss.   Plan:  - Stop Rybelsus  (semaglutide ) 7 mg daily - Start Mounjaro 2.5 mg subcutaneous weekly or Ozempic 0.25 mg subcutaneous weekly based on insurance coverage - Instructed patient on beneficial lifestyle changes to assist with weight loss, like cutting out sugary beverages, doing cardio exercise for 150 minutes/week, and limiting how frequently he eats out.

## 2024-05-09 ENCOUNTER — Other Ambulatory Visit (HOSPITAL_COMMUNITY): Payer: Self-pay

## 2024-05-10 MED ORDER — MOUNJARO 5 MG/0.5ML ~~LOC~~ SOAJ: 5.0000 mg | SUBCUTANEOUS | 1 refills | Status: DC

## 2024-05-10 NOTE — Telephone Encounter (Signed)
 Spoke to pt, will send prescription for Mounjaro to preferred pharmacy to see coverage and PA requirement. Pt to call us  back if PA needed or if there is an issue. Pt is aware this is replacement of Rybelsus .

## 2024-05-11 ENCOUNTER — Other Ambulatory Visit (HOSPITAL_COMMUNITY): Payer: Self-pay

## 2024-05-11 NOTE — Telephone Encounter (Signed)
 Patient called back reports his pharmacy said Mounjaro require PA approval - I believe paper PA has been submitted - once approved would you please patient know.

## 2024-05-18 ENCOUNTER — Ambulatory Visit

## 2024-05-18 ENCOUNTER — Telehealth: Payer: Self-pay | Admitting: Pharmacy Technician

## 2024-05-18 ENCOUNTER — Other Ambulatory Visit (HOSPITAL_COMMUNITY): Payer: Self-pay

## 2024-05-18 NOTE — Telephone Encounter (Signed)
 Pharmacy Patient Advocate Encounter   Received notification from CoverMyMeds that prior authorization for Mounjaro  5 MG is required/requested.   Insurance verification completed.   The patient is insured through [facility] ADVANTAGE/RX ADVANCE.   Per test claim: PA required; PA submitted to above mentioned insurance via Latent Key/confirmation #/EOC [id number] Status is pending

## 2024-05-18 NOTE — Telephone Encounter (Signed)
 Pharmacy Patient Advocate Encounter  Received notification from [employer] that Prior Authorization for mounjaro  has been APPROVED from 05/18/24 to 05/19/2027   PA #/Case ID/Reference #: [id number]

## 2024-05-22 ENCOUNTER — Other Ambulatory Visit: Payer: Self-pay | Admitting: Student in an Organized Health Care Education/Training Program

## 2024-05-22 MED ORDER — MOUNJARO 2.5 MG/0.5ML ~~LOC~~ SOAJ: 2.5000 mg | SUBCUTANEOUS | 0 refills | Status: AC

## 2024-05-22 NOTE — Telephone Encounter (Signed)
 Called pharmacy to confirm Mounjaro  copay. Pharmacist noted a prior authorization was needed and questioned why 5 mg was prescribed instead of starting at 2.5 mg. I submitted a prescription for the starting dose (2.5 mg), and confirmed copay is $0. Called patient to inform them the medication has zero copay but is currently out of stock; advised patient to call the pharmacy this week to check availability. Also reminded patient to contact office for refill after he's taken 3rd dose of Mounjaro .

## 2024-05-22 NOTE — Addendum Note (Signed)
 Addended by: Alexxander Kurt E on: 05/22/2024 02:44 PM   Modules accepted: Orders

## 2024-05-24 ENCOUNTER — Ambulatory Visit: Payer: Self-pay | Admitting: Student in an Organized Health Care Education/Training Program

## 2024-05-24 DIAGNOSIS — R002 Palpitations: Secondary | ICD-10-CM | POA: Diagnosis not present

## 2024-05-25 MED ORDER — CARVEDILOL 25 MG PO TABS: 25.0000 mg | ORAL_TABLET | Freq: Two times a day (BID) | ORAL | 3 refills | Status: AC

## 2024-05-30 ENCOUNTER — Ambulatory Visit: Admitting: Internal Medicine

## 2024-05-30 NOTE — Progress Notes (Deleted)
 Patient ID: [name], male   DOB: March 11, 1971, 53 y.o.   MRN: [id number]  HPI: TOBYN [name] is a 53 y.o.-year-old male, returning for f/u for DM2, dx in 2008, non-insulin -dependent, uncontrolled, with complications (CAD, h/o TIA, PN, ED). He prev. Saw Dr. Kassie.  Last visit with me 4 months ago.  Interim history: No increased urination, blurry vision, nausea. He also has sarcoidosis and severe COPD.    Reviewed HbA1c: Lab Results  Component Value Date   HGBA1C 7.2 (A) 01/25/2024   HGBA1C 6.9 (A) 07/26/2023   HGBA1C 6.7 (A) 01/19/2023   HGBA1C 6.4 (A) 07/07/2022   HGBA1C 6.7 (A) 03/02/2022   HGBA1C 7.2 (A) 10/21/2021   HGBA1C 7.4 (A) 07/22/2021   HGBA1C 7.6 (A) 04/22/2021   HGBA1C 7.4 (A) 02/18/2021   HGBA1C 7.9 (H) 12/05/2020  In Dr. Talitha office: 12/2021: HbA1c <7% reportedly  He is on: - Metformin  ER 1000 mg 2x a day >> Synjardy  (Empagliflozin -Metformin ) ER 25-1000 mg in am - Rybelsus  (started 07/2021): 7 mg daily in am >> changed to Mounjaro  2.5 mg weekly by cardiology He had weight gain with Actos . He had nausea with Repaglinide . Stopped Nateglinide  per cardiology.  He has a h/o gall bladder- related pancreatitis 2019.  Pt checks his sugars >4x a day with his sensor:  Previously: - am: 120s - 2h after b'fast: <220 - lunch: <120 - 2h after lunch: <200 - dinner: ? - 2h after dinner: ? - bedtime: ?  Previously:   Lowest sugar was 54 >> ... 20s; ? hypoglycemia awareness.  Highest sugar was 290 >> ... 250 (Holidays) >> 200.  Glucometer: One Touch Verio  - no CKD, last BUN/creatinine:  Lab Results  Component Value Date   BUN 11 04/20/2024   BUN 13 04/13/2024   CREATININE 0.69 04/20/2024   CREATININE 0.65 04/13/2024   Lab Results  Component Value Date   MICRALBCREAT 25.0 10/12/2023   MICRALBCREAT 24.5 10/07/2009  On Entresto .  - + HL; last set of lipids: Lab Results  Component Value Date   CHOL 118 10/12/2023   HDL 47.80 10/12/2023    LDLCALC 49 10/12/2023   TRIG 106.0 10/12/2023   CHOLHDL 2 10/12/2023  On Crestor  40 mg daily.  - last eye exam was in 10/2023 - No DR.   - no numbness and tingling in his feet. Last foot exam: 10/12/2023.  He has a h/o: HTN, gout, pulm. Sarcoidosis, COPD stage IV - He is not smoking now but did smoke and stopped in 2010, seizures, GERD. Also, recurring pericarditis. At our visit from 06/2022, he had prostatitis with increased urination/nocturia and lower abdominal pressure - Urologist: Dr. Rosalind. He had another episode of acute prostatitis 01/10/2024.  This resolved with ABx. He had CP 0512/2025 >> this was reportedly considered an AMI - on ASA now. Continued Entresto  and Metoprolol .  ROS: + see HPI  Past Medical History:  Diagnosis Date   Anginal pain    Arthritis    ? 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 Followed by Dr. Ishmael   Arthritis    ankles (04/13/2016)   Asthma    CORONARY ARTERY DISEASE    a. cath 05/2014: mild LAD disease, normal LCx and RCA. Risk factor modification recommended.   DIABETES MELLITUS, TYPE II    Edema    ERECTILE DYSFUNCTION, ORGANIC    GERD    Gout, unspecified    Heart murmur    HYPERLIPIDEMIA    HYPERTENSION    Narcolepsy  without cataplexy(347.00)    MSLT 01/09/09 & MRI brain 01/09/09   Pericarditis    recurrent   POLYNEUROPATHY    PULMONARY SARCOIDOSIS    Mediastinal lymphadenopathy with biospy proven sarcodosis   Seizures (HCC)    none in 4-5 years; don't know what kind; not related to alcohol (04/13/2016)   TESTICULAR HYPOFUNCTION    TIA (transient ischemic attack)    I don't remember when (04/13/2016)   Past Surgical History:  Procedure Laterality Date   BRONCHOSCOPY  08/21/08   CARDIAC CATHETERIZATION  06/25/2009   minimal disease   CARDIAC CATHETERIZATION N/A 04/14/2016   Procedure: Left Heart Cath and Coronary Angiography;  Surgeon: Lonni JONETTA Cash, MD;  Location: [facility] INVASIVE CV LAB;  Service: Cardiovascular;   Laterality: N/A;   CHOLECYSTECTOMY N/A 09/21/2017   Procedure: LAPAROSCOPIC CHOLECYSTECTOMY WITH INTRAOPERATIVE CHOLANGIOGRAM;  Surgeon: Mikell Katz, MD;  Location: [facility] ORS;  Service: General;  Laterality: N/A;   LEFT HEART CATH AND CORONARY ANGIOGRAPHY N/A 10/26/2023   Procedure: LEFT HEART CATH AND CORONARY ANGIOGRAPHY;  Surgeon: Anner Alm ORN, MD;  Location: [facility] INVASIVE CV LAB;  Service: Cardiovascular;  Laterality: N/A;   LEFT HEART CATHETERIZATION WITH CORONARY ANGIOGRAM N/A 05/18/2014   Procedure: LEFT HEART CATHETERIZATION WITH CORONARY ANGIOGRAM;  Surgeon: Candyce GORMAN Reek, MD;  Location: [facility] CATH LAB;  Service: Cardiovascular;  Laterality: N/A;   MEDIASTINOSCOPY  11/30/08   Social History   Socioeconomic History   Marital status: Significant Other    Spouse name: Not on file   Number of children: 3   Years of education: Not on file   Highest education level: Not on file  Occupational History   Occupation: Unemployed  Tobacco Use   Smoking status: Former    Current packs/day: 0.00    Average packs/day: 1 pack/day for 10.0 years (10.0 ttl pk-yrs)    Types: Cigarettes    Start date: 05/19/1999    Quit date: 05/18/2009    Years since quitting: 15.0    Passive exposure: Past   Smokeless tobacco: Never  Vaping Use   Vaping status: Never Used  Substance and Sexual Activity   Alcohol use: Yes    Comment: rare   Drug use: No   Sexual activity: Yes  Other Topics Concern   Not on file  Social History Narrative   Married, lives with wife and 3 kids.    Currently student. 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 [employer]   Social Drivers of Health   Tobacco Use: Medium Risk (04/28/2024)   Patient History    Smoking Tobacco Use: Former    Smokeless Tobacco Use: Never    Passive Exposure: Past  Physicist, Medical Strain: Low Risk (06/23/2022)   Overall Financial Resource Strain (CARDIA)    Difficulty of Paying Living Expenses: Not hard at all  Food Insecurity: No  Food Insecurity (06/23/2022)   Hunger Vital Sign    Worried About Running Out of Food in the Last Year: Never true    Ran Out of Food in the Last Year: Never true  Transportation Needs: No Transportation Needs (06/23/2022)   PRAPARE - Administrator, Civil Service (Medical): No    Lack of Transportation (Non-Medical): No  Physical Activity: Sufficiently Active (06/23/2022)   Exercise Vital Sign    Days of Exercise per Week: 7 days    Minutes of Exercise per Session: 30 min  Stress: Stress Concern Present (06/23/2022)   [employer] of Occupational Health - Occupational Stress  Questionnaire    Feeling of Stress : To some extent  Social Connections: Socially Integrated (06/23/2022)   Social Connection and Isolation Panel    Frequency of Communication with Friends and Family: More than three times a week    Frequency of Social Gatherings with Friends and Family: More than three times a week    Attends Religious Services: More than 4 times per year    Active Member of [employer] or Organizations: No    Attends Engineer, Structural: More than 4 times per year    Marital Status: Living with partner  Intimate Partner Violence: Not At Risk (06/23/2022)   Humiliation, Afraid, Rape, and Kick questionnaire    Fear of Current or Ex-Partner: No    Emotionally Abused: No    Physically Abused: No    Sexually Abused: No  Depression (PHQ2-9): Low Risk (04/13/2024)   Depression (PHQ2-9)    PHQ-2 Score: 0  Alcohol Screen: Low Risk (06/23/2022)   Alcohol Screen    Last Alcohol Screening Score (AUDIT): 0  Housing: Low Risk (06/23/2022)   Housing    Last Housing Risk Score: 0  Utilities: Not At Risk (06/23/2022)   AHC Utilities    Threatened with loss of utilities: No  Health Literacy: Not on file   Current Outpatient Medications on File Prior to Visit  Medication Sig Dispense Refill   Albuterol -Budesonide  (AIRSUPRA ) 90-80 MCG/ACT AERO Inhale 2 puffs into the lungs every 4 (four)  hours as needed. 10.7 g 1   allopurinol  (ZYLOPRIM ) 300 MG tablet TAKE 1 TABLET(300 MG) BY MOUTH DAILY 90 tablet 0   aspirin  EC 81 MG tablet Take 1 tablet (81 mg total) by mouth daily. 30 tablet 3   carvedilol  (COREG ) 25 MG tablet Take 1 tablet (25 mg total) by mouth 2 (two) times daily. 180 tablet 3   cetirizine (ZYRTEC) 10 MG tablet Take 10 mg by mouth daily.     cholecalciferol  (VITAMIN D ) 1000 units tablet Take 1,000 Units by mouth daily.     colchicine  0.6 MG tablet Take 1 tablet (0.6 mg total) by mouth 2 (two) times daily. 180 tablet 0   Continuous Glucose Sensor (DEXCOM G7 SENSOR) MISC USE TO CHECK GLUCOSE CONTINUOUSLY, CHANGE SENSOR EVERY 10 DAYS     docusate sodium  (COLACE) 100 MG capsule Take 100 mg by mouth daily.     Empagliflozin -metFORMIN  HCl ER (SYNJARDY  XR) 25-1000 MG TB24 Take 1 tablet by mouth daily. 90 tablet 3   fluticasone  (FLONASE ) 50 MCG/ACT nasal spray SPRAY 2 SPRAYS INTO EACH NOSTRIL EVERY DAY 48 mL 2   Fluticasone -Umeclidin-Vilant (TRELEGY ELLIPTA ) 100-62.5-25 MCG/ACT AEPB Inhale 1 puff into the lungs daily.     ipratropium-albuterol  (DUONEB) 0.5-2.5 (3) MG/3ML SOLN Take 3 mLs by nebulization every 4 (four) hours as needed. 360 mL 1   nitroGLYCERIN  (NITROSTAT ) 0.4 MG SL tablet Place 1 tablet (0.4 mg total) under the tongue every 5 (five) minutes as needed for chest pain. 25 tablet 3   pantoprazole  (PROTONIX ) 40 MG tablet TAKE 2 TABLETS(80 MG) BY MOUTH DAILY 180 tablet 0   rosuvastatin  (CRESTOR ) 40 MG tablet TAKE 1 TAB BY MOUTH DAILY 90 tablet 1   saw palmetto  500 MG capsule Take 500 mg by mouth daily.     spironolactone (ALDACTONE) 25 MG tablet Take 25 mg by mouth daily.     tadalafil  (CIALIS ) 20 MG tablet TAKE ONE TABLET BY MOUTH DAILY AS NEEDED (Patient taking differently: Take 20 mg by mouth as needed for  erectile dysfunction.) 20 tablet 11   testosterone  cypionate (DEPOTESTOSTERONE CYPIONATE) 200 MG/ML injection Inject 200 mg into the muscle every 7 (seven) days.      tirzepatide  (MOUNJARO ) 2.5 MG/0.5ML Pen Inject 2.5 mg into the skin once a week. 2 mL 0   valsartan  (DIOVAN ) 320 MG tablet Take 1 tablet (320 mg total) by mouth daily. 30 tablet 11   Zinc Sulfate (ZINC 15 PO) Take 1 tablet by mouth daily.     No current facility-administered medications on file prior to visit.   Allergies  Allergen Reactions   Amlodipine  Other (See Comments)    Ankle swelling with 10mg , ok with 5mg  dose   Sulfamethoxazole Rash    fever   Sulfonamide Derivatives Rash and Other (See Comments)   Family History  Problem Relation Age of Onset   Diabetes Mother    Hypertension Mother    Colon polyps Mother    Diabetes Father    Heart disease Father 18       Fatal MI   Heart failure Father    Colon polyps Father    Heart attack Father    Pancreatic cancer Maternal Grandmother    Stomach cancer Maternal Uncle    Pancreatic cancer Maternal Uncle    Colon cancer Neg Hx    Esophageal cancer Neg Hx    Rectal cancer Neg Hx    Liver disease Neg Hx    PE: There were no vitals taken for this visit. Wt Readings from Last 3 Encounters:  04/28/24 269 lb 6.4 oz (122.2 kg)  04/14/24 267 lb 4.8 oz (121.2 kg)  04/13/24 267 lb 6.4 oz (121.3 kg)   Constitutional: overweight, in NAD Eyes: EOMI, no exophthalmos ENT: no thyromegaly, no cervical lymphadenopathy Cardiovascular: RRR, No MRG Respiratory: CTA B Musculoskeletal: no deformities Skin: no rashes Neurological: no tremor with outstretched hands  ASSESSMENT: 1. DM2, non-insulin -dependent, uncontrolled, with complications - CAD - h/o TIA - PN - ED  2. HL  PLAN:  1. Patient with longstanding, previously uncontrolled type 2 diabetes, but with improved control on a combination between metformin , SGLT2 inhibitor and p.o. GLP-1 receptor agonist.  At last visit, his HbA1c was slightly higher than before, up to 200s after meals.  Unfortunately, I was not able to review his Dexcom tracings as he was not able to login to  his account so we did not change his regimen at that time.  HbA1c was only slightly higher, at 7.2%. -However, since last visit, his Rybelsus  was changed to Mounjaro  by cardiology.  He is on the lowest dose now.  He tolerates it well.  - I suggested to:  Patient Instructions  Please continue: - Mounjaro  2.5 mg weekly - Synjardy  1000-25 mg in am  Please return in 4-6 months.  - we checked his HbA1c: 7%  - advised to check sugars at different times of the day - 1x a day, rotating check times - advised for yearly eye exams >> he is UTD - return to clinic in 4-6 months  2.  HL - Latest lipid panel was reviewed from 10/2023: all fractions at goal: Lab Results  Component Value Date   CHOL 118 10/12/2023   HDL 47.80 10/12/2023   LDLCALC 49 10/12/2023   TRIG 106.0 10/12/2023   CHOLHDL 2 10/12/2023  - Will continue Crestor  40 mg daily.  No side effects.  Lela Fendt, MD PhD [facility] Endocrinology

## 2024-06-02 ENCOUNTER — Ambulatory Visit: Admitting: Adult Health

## 2024-06-13 ENCOUNTER — Ambulatory Visit: Payer: Self-pay

## 2024-06-13 ENCOUNTER — Encounter: Payer: Self-pay | Admitting: Family Medicine

## 2024-06-13 ENCOUNTER — Ambulatory Visit: Admitting: Family Medicine

## 2024-06-13 VITALS — BP 138/86 | HR 89 | Temp 97.9°F | Ht 75.0 in | Wt 269.0 lb

## 2024-06-13 DIAGNOSIS — N410 Acute prostatitis: Secondary | ICD-10-CM

## 2024-06-13 DIAGNOSIS — R350 Frequency of micturition: Secondary | ICD-10-CM

## 2024-06-13 LAB — POCT URINALYSIS DIPSTICK
Bilirubin, UA: NEGATIVE
Blood, UA: NEGATIVE
Glucose, UA: POSITIVE — AB
Ketones, UA: NEGATIVE
Leukocytes, UA: NEGATIVE
Nitrite, UA: NEGATIVE
Protein, UA: POSITIVE — AB
Spec Grav, UA: 1.015
Urobilinogen, UA: 0.2 U/dL
pH, UA: 6

## 2024-06-13 MED ORDER — CIPROFLOXACIN HCL 500 MG PO TABS: 500.0000 mg | ORAL_TABLET | Freq: Two times a day (BID) | ORAL | 0 refills | Status: AC

## 2024-06-13 NOTE — Progress Notes (Signed)
 "  Acute Office Visit   Subjective:  Patient ID: [name], male    DOB: 1970-07-14, 54 y.o.   MRN: [id number]  Chief Complaint  Patient presents with   Urinary Frequency   Back Pain    HPI:  Patient is present for an acute visit. His primary care provider is Dr. Debby Molt.   He is complaining of urinary frequency and back pain. He has history of prostatitis, last was in 01/2024. Urinary frequency started over the weekend and back pain started yesterday. Associated symptoms increase in urgency, and mild lower abd pain. He reports sometimes he trouble with starting to urinate at night, but relates this to enlarged prostate.  Denies any hematuria, fever, nausea, or vomiting. He reports these symptoms are similar to when he prostatitis.    Last seen [facility] back in December. He reports he had a good visit with a normal urinalysis, but didn't have a PSA collected.    Review of Systems  Genitourinary:  Positive for frequency.  Musculoskeletal:  Positive for back pain.   See HPI above      Objective:   BP 138/86   Pulse 89   Temp 97.9 F (36.6 C) (Oral)   Ht 6' 3 (1.905 m)   Wt 269 lb (122 kg)   SpO2 98%   BMI 33.62 kg/m    Physical Exam Vitals reviewed.  Constitutional:      General: He is not in acute distress.    Appearance: Normal appearance. He is not ill-appearing, toxic-appearing or diaphoretic.  HENT:     Head: Normocephalic and atraumatic.  Eyes:     General:        Right eye: No discharge.        Left eye: No discharge.     Conjunctiva/sclera: Conjunctivae normal.  Cardiovascular:     Rate and Rhythm: Normal rate and regular rhythm.     Heart sounds: Normal heart sounds. No murmur heard.    No friction rub. No gallop.  Pulmonary:     Effort: Pulmonary effort is normal. No respiratory distress.     Breath sounds: Normal breath sounds.  Abdominal:     General: Bowel sounds are normal. There is no distension.     Palpations: Abdomen is  soft. There is no mass.     Tenderness: There is no abdominal tenderness. There is no right CVA tenderness or left CVA tenderness.  Musculoskeletal:        General: Normal range of motion.  Skin:    General: Skin is warm and dry.  Neurological:     General: No focal deficit present.     Mental Status: He is alert and oriented to person, place, and time. Mental status is at baseline.  Psychiatric:        Mood and Affect: Mood normal.        Behavior: Behavior normal.        Thought Content: Thought content normal.        Judgment: Judgment normal.     Results for orders placed or performed in visit on 06/13/24  POC Urinalysis Dipstick  Result Value Ref Range   Color, UA yellow    Clarity, UA clear    Glucose, UA Positive (A) Negative   Bilirubin, UA neg    Ketones, UA negative    Spec Grav, UA 1.015 1.010 - 1.025   Blood, UA negative    pH, UA 6.0 5.0 - 8.0  Protein, UA Positive (A) Negative   Urobilinogen, UA 0.2 0.2 or 1.0 E.U./dL   Nitrite, UA negative    Leukocytes, UA Negative Negative   Appearance     Odor          Assessment & Plan:  Urine frequency -     POCT urinalysis dipstick -     Urine Culture  Acute prostatitis -     Ciprofloxacin  HCl; Take 1 tablet (500 mg total) by mouth 2 (two) times daily for 14 days.  Dispense: 28 tablet; Refill: 0  -Urinalysis shows positive glucose and protein, with no white blood cells present. However, with symptoms feeling similar to prostatitis, will start treatment. Will send urine for culture.  -Prescribed Ciprofloxacin  500mg  tablet, take 1 tablet every 12 hours for 14 days.  -Hydrate with water -Recommend to follow up with [facility].   Rheta Hemmelgarn, NP "

## 2024-06-13 NOTE — Telephone Encounter (Signed)
 FYI Only or Action Required?: FYI only for provider: appointment scheduled on 1.6.25.  Patient was last seen in primary care on 04/13/2024 by [name], MD.  Called Nurse Triage reporting Dysuria.  Symptoms began several days ago.  Interventions attempted: Nothing.  Symptoms are: gradually worsening.  Triage Disposition: See HCP Within 4 Hours (Or PCP Triage)  Patient/caregiver understands and will follow disposition?: Yes       Copied from CRM [id number]. Topic: Clinical - Red Word Triage >> Jun 13, 2024  9:04 AM Olam RAMAN wrote: Red Word that prompted transfer to Nurse Triage: recurring back pain, frequent uniration. some burning not always. No fever/chills Reason for Disposition  Side (flank) or lower back pain present  Answer Assessment - Initial Assessment Questions Pt states history of prostatitis. Has had increased frequency in urination. Saturday he noticed the low back pain across his whole back. He has had some burning when he urinates at the beginning but better now. Low back pain is 7-8/10. Now going about every hour. Denies any higher acuity symptoms.     1. SYMPTOM: What's the main symptom you're concerned about? (e.g., frequency, incontinence)     Frequency 2. ONSET: When did the  symptoms  start?     Last week 3. PAIN: Is there any pain? If Yes, ask: How bad is it? (Scale: 1-10; mild, moderate, severe)     Yes low back- 7-8 4. CAUSE: What do you think is causing the symptoms?     Believes prostatitis 5. OTHER SYMPTOMS: Do you have any other symptoms? (e.g., blood in urine, fever, flank pain, pain with urination)     Low back pain, was having pain with urination, resolved.  Protocols used: Urinary Symptoms-A-AH

## 2024-06-13 NOTE — Patient Instructions (Signed)
-  It was a pleasure to care for you today.  -Urinalysis shows positive glucose and protein, with no white blood cells present. However, with symptoms feeling similar to prostatitis, will start treatment. Will send urine for culture.  -Prescribed Ciprofloxacin  500mg  tablet, take 1 tablet every 12 hours for 14 days.  -Hydrate with water -Recommend to follow up with [facility].

## 2024-06-14 LAB — URINE CULTURE
MICRO NUMBER:: 17431972
Result:: NO GROWTH
SPECIMEN QUALITY:: ADEQUATE

## 2024-06-15 ENCOUNTER — Ambulatory Visit: Payer: Self-pay | Admitting: Family Medicine

## 2024-06-20 ENCOUNTER — Ambulatory Visit: Admitting: Primary Care

## 2024-06-20 NOTE — Progress Notes (Unsigned)
 "  @Patient  ID: [name], male    DOB: 1970-07-24, 54 y.o.   MRN: [id number]  No chief complaint on file.   Referring provider: [name], MD  HPI:   Previous [facility] pulmonary encounter:  12/16/2023 -   Chief Complaint  Patient presents with   Follow-up    Pt has no concerns.    [name] 54 y.o. -presents for now 38-month follow-up.  He says has been doing well. Interim Health status: No new complaints No new medical problems. No new surgeries. No ER visits. No Urgent care visits. No changes to medications other than the fact.  Advair did not work well for him.  He has not failed Breztri .  He has failed Spiriva /Advair.  He is doing Airsupra  as needed with the last use 2 weeks ago and that also was not working as well.  He says only Trelegy works well for him.  Unfortunately insurance not approving this.  Will give him some more samples.  His pulmonary function test though did show improvement.  He agrees to take flu shot in the fall.   Stage 4 very severe COPD -Behaving like advanced COPD even without significant smoking history - Too bad Trelegy is not being approved by insurance and this worked the best -Understandably Airsupra  is not working too well for you - Also noted spiriva /advair did not work well for you - Also Breztri  samples did not work well for you - lung function may 2025 shows moderate obstructio and is imprived from June 2024   Plan - Take Trelegy scheduled             - likely needs prior authorization - Do albuterol  as needed     Followup -6 months 15-minute visit nurse practitioner   06/20/2024- Interim hx  Discussed the use of AI scribe software for clinical note transcription with the patient, who gave verbal consent to proceed.  History of Present Illness      SYMPTOM SCALE -general symptom scale on Airsupra  04/23/2023 12/16/2023   Current weight    O2 use Ra -taking Airsupra  as needed. -> will start Spiriva /Advair Now on airpura  prn - > but say trelegy works betst  Shortness of Breath 0 -> 5 scale with 5 being worst (score 6 If unable to do)   At rest 0   Simple tasks - showers, clothes change, eating, shaving 2   Household (dishes, doing bed, laundry) 2   Shopping 2   Walking level at own pace 2   Walking up Stairs 5   Total (30-36) Dyspnea Score 13   How bad is your cough? 2   How bad is your fatigue 3   How bad is nausea 0   How bad is vomiting?  0   How bad is diarrhea? 0   How bad is anxiety? 0   How bad is depression 0   Any chronic pain - if so where and how bad 0      Allergies[1]  Immunization History  Administered Date(s) Administered   Influenza Split 04/06/2011, 02/17/2012, 03/16/2012   Influenza, Mdck, Trivalent,PF 6+ MOS(egg free) 02/29/2024   Influenza, Quadrivalent, Recombinant, Inj, Pf 02/20/2022   Influenza,inj,Quad PF,6+ Mos 03/21/2013, 03/08/2014, 04/03/2015, 02/19/2016, 02/17/2017, 06/14/2018, 02/20/2019, 03/21/2020   Influenza-Unspecified 03/08/2021, 02/07/2023   Moderna Sars-Covid-2 Vaccination 07/31/2019, 09/05/2019, 04/19/2020, 02/20/2022   Pfizer(Comirnaty)Fall Seasonal Vaccine 12 years and older 02/20/2022, 02/29/2024   Pneumococcal Conjugate-13 04/29/2015   Pneumococcal Polysaccharide-23 11/06/2015, 10/12/2023  Tdap 03/21/2013, 10/12/2023   Zoster Recombinant(Shingrix ) 04/13/2024    Past Medical History:  Diagnosis Date   Anginal pain    Arthritis    ? 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 Followed by Dr. Ishmael   Arthritis    ankles (04/13/2016)   Asthma    CORONARY ARTERY DISEASE    a. cath 05/2014: mild LAD disease, normal LCx and RCA. Risk factor modification recommended.   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)    MSLT 01/09/09 & MRI brain 01/09/09   Pericarditis    recurrent   POLYNEUROPATHY     PULMONARY SARCOIDOSIS    Mediastinal lymphadenopathy with biospy proven sarcodosis   Seizures (HCC)    none in 4-5 years; don't know what kind; not related to alcohol (04/13/2016)   TESTICULAR HYPOFUNCTION    TIA (transient ischemic attack)    I don't remember when (04/13/2016)    Tobacco History: Tobacco Use History[2] Counseling given: Not Answered   Outpatient Medications Prior to Visit  Medication Sig Dispense Refill   Albuterol -Budesonide  (AIRSUPRA ) 90-80 MCG/ACT AERO Inhale 2 puffs into the lungs every 4 (four) hours as needed. 10.7 g 1   allopurinol  (ZYLOPRIM ) 300 MG tablet TAKE 1 TABLET(300 MG) BY MOUTH DAILY 90 tablet 0   aspirin  EC 81 MG tablet Take 1 tablet (81 mg total) by mouth daily. 30 tablet 3   carvedilol  (COREG ) 25 MG tablet Take 1 tablet (25 mg total) by mouth 2 (two) times daily. 180 tablet 3   cetirizine (ZYRTEC) 10 MG tablet Take 10 mg by mouth daily.     cholecalciferol  (VITAMIN D ) 1000 units tablet Take 1,000 Units by mouth daily.     ciprofloxacin  (CIPRO ) 500 MG tablet Take 1 tablet (500 mg total) by mouth 2 (two) times daily for 14 days. 28 tablet 0   colchicine  0.6 MG tablet Take 1 tablet (0.6 mg total) by mouth 2 (two) times daily. 180 tablet 0   Continuous Glucose Sensor (DEXCOM G7 SENSOR) MISC USE TO CHECK GLUCOSE CONTINUOUSLY, CHANGE SENSOR EVERY 10 DAYS     docusate sodium  (COLACE) 100 MG capsule Take 100 mg by mouth daily.     Empagliflozin -metFORMIN  HCl ER (SYNJARDY  XR) 25-1000 MG TB24 Take 1 tablet by mouth daily. 90 tablet 3   fluticasone  (FLONASE ) 50 MCG/ACT nasal spray SPRAY 2 SPRAYS INTO EACH NOSTRIL EVERY DAY 48 mL 2   Fluticasone -Umeclidin-Vilant (TRELEGY ELLIPTA ) 100-62.5-25 MCG/ACT AEPB Inhale 1 puff into the lungs daily.     ipratropium-albuterol  (DUONEB) 0.5-2.5 (3) MG/3ML SOLN Take 3 mLs by nebulization every 4 (four) hours as needed. 360 mL 1   nitroGLYCERIN  (NITROSTAT ) 0.4 MG SL tablet Place 1 tablet (0.4 mg total) under the tongue every  5 (five) minutes as needed for chest pain. 25 tablet 3   pantoprazole  (PROTONIX ) 40 MG tablet TAKE 2 TABLETS(80 MG) BY MOUTH DAILY 180 tablet 0   rosuvastatin  (CRESTOR ) 40 MG tablet TAKE 1 TAB BY MOUTH DAILY 90 tablet 1   saw palmetto  500 MG capsule Take 500 mg by mouth daily.     spironolactone (ALDACTONE) 25 MG tablet Take 25 mg by mouth daily.     tadalafil  (CIALIS ) 20 MG tablet TAKE ONE TABLET BY MOUTH DAILY AS NEEDED (Patient taking differently: Take 20 mg by mouth  as needed for erectile dysfunction.) 20 tablet 11   testosterone  cypionate (DEPOTESTOSTERONE CYPIONATE) 200 MG/ML injection Inject 200 mg into the muscle every 7 (seven) days.     tirzepatide  (MOUNJARO ) 2.5 MG/0.5ML Pen Inject 2.5 mg into the skin once a week. 2 mL 0   valsartan  (DIOVAN ) 320 MG tablet Take 1 tablet (320 mg total) by mouth daily. 30 tablet 11   Zinc Sulfate (ZINC 15 PO) Take 1 tablet by mouth daily.     No facility-administered medications prior to visit.      Review of Systems  Review of Systems   Physical Exam  There were no vitals taken for this visit. Physical Exam  ***  Lab Results:  CBC    Component Value Date/Time   WBC 5.8 04/20/2024 1528   RBC 5.20 04/20/2024 1528   HGB 14.0 04/20/2024 1528   HGB 15.7 10/21/2023 1503   HCT 40.5 04/20/2024 1528   HCT 50.9 10/21/2023 1503   PLT 224 04/20/2024 1528   PLT 332 10/21/2023 1503   MCV 77.9 (L) 04/20/2024 1528   MCV 84 10/21/2023 1503   MCH 26.9 04/20/2024 1528   MCHC 34.6 04/20/2024 1528   RDW 13.8 04/20/2024 1528   RDW 14.4 10/21/2023 1503   LYMPHSABS 2.1 04/13/2024 0950   MONOABS 0.4 04/13/2024 0950   EOSABS 0.2 04/13/2024 0950   BASOSABS 0.1 04/13/2024 0950    BMET    Component Value Date/Time   NA 142 04/20/2024 1528   NA 138 10/21/2023 1503   K 3.6 04/20/2024 1528   CL 104 04/20/2024 1528   CO2 25 04/20/2024 1528   GLUCOSE 140 (H) 04/20/2024 1528   BUN 11 04/20/2024 1528   BUN 13 10/21/2023 1503   CREATININE 0.69  04/20/2024 1528   CREATININE 0.91 04/07/2016 1529   CALCIUM  9.3 04/20/2024 1528   GFRNONAA >60 04/20/2024 1528   GFRAA >60 02/11/2020 1954    BNP No results found for: BNP  ProBNP    Component Value Date/Time   PROBNP 42.1 06/24/2010 1520    Imaging: No results found.   Assessment & Plan:   No problem-specific Assessment & Plan notes found for this encounter.   There are no diagnoses linked to this encounter.  Assessment and Plan Assessment & Plan       I personally spent a total of *** minutes in the care of the patient today including {Time Based Coding:[id number]}.   Almarie LELON Ferrari, NP 06/20/2024     [1]  Allergies Allergen Reactions   Amlodipine  Other (See Comments)    Ankle swelling with 10mg , ok with 5mg  dose   Sulfamethoxazole Rash    fever   Sulfonamide Derivatives Rash and Other (See Comments)  [2]  Social History Tobacco Use  Smoking Status Former   Current packs/day: 0.00   Average packs/day: 1 pack/day for 10.0 years (10.0 ttl pk-yrs)   Types: Cigarettes   Start date: 05/19/1999   Quit date: 05/18/2009   Years since quitting: 15.1   Passive exposure: Past  Smokeless Tobacco Never   "

## 2024-07-04 ENCOUNTER — Ambulatory Visit (HOSPITAL_COMMUNITY)
Admission: RE | Admit: 2024-07-04 | Discharge: 2024-07-04 | Disposition: A | Discharge status: Home / Self Care | Source: Ambulatory Visit

## 2024-07-04 ENCOUNTER — Ambulatory Visit: Payer: Self-pay | Admitting: Student in an Organized Health Care Education/Training Program

## 2024-07-04 DIAGNOSIS — R002 Palpitations: Secondary | ICD-10-CM | POA: Diagnosis present

## 2024-07-04 DIAGNOSIS — I10 Essential (primary) hypertension: Secondary | ICD-10-CM

## 2024-07-04 LAB — ECHOCARDIOGRAM COMPLETE
Area-P 1/2: 4.04 cm2
S' Lateral: 2.2 cm

## 2024-07-17 ENCOUNTER — Ambulatory Visit: Admitting: Internal Medicine
